# Patient Record
Sex: Female | Born: 1970
Health system: Midwestern US, Community
[De-identification: ages and names within clinical notes are randomized; demographics above are authoritative.]

## PROBLEM LIST (undated history)

## (undated) DIAGNOSIS — Z01812 Encounter for preprocedural laboratory examination: Secondary | ICD-10-CM

## (undated) DIAGNOSIS — N6011 Diffuse cystic mastopathy of right breast: Secondary | ICD-10-CM

## (undated) DIAGNOSIS — R7303 Prediabetes: Secondary | ICD-10-CM

## (undated) DIAGNOSIS — J329 Chronic sinusitis, unspecified: Secondary | ICD-10-CM

## (undated) DIAGNOSIS — S139XXA Sprain of joints and ligaments of unspecified parts of neck, initial encounter: Secondary | ICD-10-CM

## (undated) DIAGNOSIS — R142 Eructation: Secondary | ICD-10-CM

## (undated) DIAGNOSIS — N6012 Diffuse cystic mastopathy of left breast: Secondary | ICD-10-CM

## (undated) DIAGNOSIS — R748 Abnormal levels of other serum enzymes: Secondary | ICD-10-CM

## (undated) DIAGNOSIS — Z1231 Encounter for screening mammogram for malignant neoplasm of breast: Secondary | ICD-10-CM

## (undated) DIAGNOSIS — G43809 Other migraine, not intractable, without status migrainosus: Secondary | ICD-10-CM

## (undated) DIAGNOSIS — K224 Dyskinesia of esophagus: Secondary | ICD-10-CM

## (undated) DIAGNOSIS — F064 Anxiety disorder due to known physiological condition: Secondary | ICD-10-CM

## (undated) DIAGNOSIS — R143 Flatulence: Secondary | ICD-10-CM

## (undated) DIAGNOSIS — R220 Localized swelling, mass and lump, head: Secondary | ICD-10-CM

## (undated) DIAGNOSIS — R1013 Epigastric pain: Secondary | ICD-10-CM

## (undated) DIAGNOSIS — R2232 Localized swelling, mass and lump, left upper limb: Secondary | ICD-10-CM

## (undated) DIAGNOSIS — Z1211 Encounter for screening for malignant neoplasm of colon: Secondary | ICD-10-CM

## (undated) DIAGNOSIS — R1011 Right upper quadrant pain: Secondary | ICD-10-CM

## (undated) DIAGNOSIS — R221 Localized swelling, mass and lump, neck: Secondary | ICD-10-CM

## (undated) DIAGNOSIS — R141 Gas pain: Secondary | ICD-10-CM

## (undated) DIAGNOSIS — R519 Headache, unspecified: Secondary | ICD-10-CM

## (undated) DIAGNOSIS — M7989 Other specified soft tissue disorders: Secondary | ICD-10-CM

## (undated) DIAGNOSIS — M5030 Other cervical disc degeneration, unspecified cervical region: Secondary | ICD-10-CM

## (undated) DIAGNOSIS — G4730 Sleep apnea, unspecified: Secondary | ICD-10-CM

## (undated) DIAGNOSIS — Z5189 Encounter for other specified aftercare: Secondary | ICD-10-CM

## (undated) DIAGNOSIS — Z8489 Family history of other specified conditions: Secondary | ICD-10-CM

## (undated) DIAGNOSIS — F319 Bipolar disorder, unspecified: Secondary | ICD-10-CM

## (undated) DIAGNOSIS — K219 Gastro-esophageal reflux disease without esophagitis: Secondary | ICD-10-CM

## (undated) DIAGNOSIS — K5900 Constipation, unspecified: Secondary | ICD-10-CM

## (undated) DIAGNOSIS — Z87442 Personal history of urinary calculi: Secondary | ICD-10-CM

## (undated) DIAGNOSIS — J189 Pneumonia, unspecified organism: Secondary | ICD-10-CM

## (undated) DIAGNOSIS — Z8614 Personal history of Methicillin resistant Staphylococcus aureus infection: Secondary | ICD-10-CM

## (undated) DIAGNOSIS — J45909 Unspecified asthma, uncomplicated: Secondary | ICD-10-CM

## (undated) DIAGNOSIS — F988 Other specified behavioral and emotional disorders with onset usually occurring in childhood and adolescence: Secondary | ICD-10-CM

## (undated) DIAGNOSIS — F32A Depression, unspecified: Secondary | ICD-10-CM

## (undated) DIAGNOSIS — F419 Anxiety disorder, unspecified: Secondary | ICD-10-CM

## (undated) DIAGNOSIS — E119 Type 2 diabetes mellitus without complications: Secondary | ICD-10-CM

## (undated) DIAGNOSIS — E785 Hyperlipidemia, unspecified: Secondary | ICD-10-CM

## (undated) DIAGNOSIS — G709 Myoneural disorder, unspecified: Secondary | ICD-10-CM

## (undated) DIAGNOSIS — K3184 Gastroparesis: Secondary | ICD-10-CM

## (undated) DIAGNOSIS — G43909 Migraine, unspecified, not intractable, without status migrainosus: Secondary | ICD-10-CM

## (undated) DIAGNOSIS — F329 Major depressive disorder, single episode, unspecified: Secondary | ICD-10-CM

## (undated) DIAGNOSIS — J449 Chronic obstructive pulmonary disease, unspecified: Secondary | ICD-10-CM

## (undated) DIAGNOSIS — M1990 Unspecified osteoarthritis, unspecified site: Secondary | ICD-10-CM

## (undated) DIAGNOSIS — E039 Hypothyroidism, unspecified: Secondary | ICD-10-CM

## (undated) DIAGNOSIS — R1310 Dysphagia, unspecified: Secondary | ICD-10-CM

## (undated) DIAGNOSIS — J42 Unspecified chronic bronchitis: Secondary | ICD-10-CM

## (undated) DIAGNOSIS — T7840XA Allergy, unspecified, initial encounter: Secondary | ICD-10-CM

## (undated) HISTORY — DX: Personal history of Methicillin resistant Staphylococcus aureus infection: Z86.14

## (undated) HISTORY — DX: Constipation, unspecified: K59.00

## (undated) HISTORY — PX: TONSILLECTOMY AND ADENOIDECTOMY: SHX28

## (undated) HISTORY — DX: Allergy, unspecified, initial encounter: T78.40XA

## (undated) HISTORY — DX: Chronic obstructive pulmonary disease, unspecified: J44.9

## (undated) HISTORY — PX: REPLACEMENT TOTAL KNEE: SUR1224

## (undated) HISTORY — DX: Gastroparesis: K31.84

## (undated) HISTORY — DX: Bipolar disorder, unspecified: F31.9

## (undated) HISTORY — DX: Unspecified osteoarthritis, unspecified site: M19.90

## (undated) HISTORY — DX: Depression, unspecified: F32.A

## (undated) HISTORY — DX: Myoneural disorder, unspecified: G70.9

## (undated) HISTORY — DX: Anxiety disorder, unspecified: F41.9

## (undated) HISTORY — PX: COLONOSCOPY: SHX174

## (undated) HISTORY — DX: Hypothyroidism, unspecified: E03.9

## (undated) HISTORY — DX: Encounter for other specified aftercare: Z51.89

## (undated) HISTORY — DX: Major depressive disorder, single episode, unspecified: F32.9

## (undated) HISTORY — DX: Unspecified chronic bronchitis: J42

## (undated) HISTORY — DX: Other cervical disc degeneration, unspecified cervical region: M50.30

## (undated) HISTORY — PX: ABDOMINAL SURGERY: SHX537

## (undated) HISTORY — PX: UPPER GASTROINTESTINAL ENDOSCOPY: SHX188

## (undated) HISTORY — PX: TONSILLECTOMY: SUR1361

## (undated) HISTORY — PX: SPINE SURGERY: SHX786

## (undated) HISTORY — PX: THYROIDECTOMY: SHX17

## (undated) HISTORY — DX: Dysphagia, unspecified: R13.10

## (undated) HISTORY — DX: Type 2 diabetes mellitus without complications: E11.9

## (undated) HISTORY — PX: JOINT REPLACEMENT: SHX530

## (undated) HISTORY — DX: Hyperlipidemia, unspecified: E78.5

## (undated) HISTORY — DX: Migraine, unspecified, not intractable, without status migrainosus: G43.909

## (undated) HISTORY — DX: Gastro-esophageal reflux disease without esophagitis: K21.9

## (undated) HISTORY — DX: Unspecified asthma, uncomplicated: J45.909

## (undated) HISTORY — PX: CHOLECYSTECTOMY: SHX55

## (undated) HISTORY — DX: Sleep apnea, unspecified: G47.30

## (undated) HISTORY — DX: Other specified behavioral and emotional disorders with onset usually occurring in childhood and adolescence: F98.8

## (undated) MED ORDER — METHYLPREDNISOLONE 4 MG TABS IN A DOSE PACK
4 mg | PACK | ORAL | Status: DC
Start: ? — End: 2014-06-29

## (undated) MED ORDER — MOXIFLOXACIN 400 MG TAB
400 mg | ORAL_TABLET | Freq: Every day | ORAL | Status: DC
Start: ? — End: 2014-09-07

## (undated) MED ORDER — BENZONATATE 100 MG CAP
100 mg | ORAL_CAPSULE | Freq: Three times a day (TID) | ORAL | Status: AC | PRN
Start: ? — End: 2013-06-02

## (undated) MED FILL — AMPHETAMINE-DEXTROAMPHETAMIN 5 TABS: 5 MG | 30 days supply | Qty: 30 | Fill #0 | Status: AC

## (undated) MED FILL — ALBUTEROL SULFATE HFA 108 AERS: 108 (90 Base) MCG/ACT | 16 days supply | Qty: 9 | Fill #0 | Status: AC

## (undated) MED FILL — UNITHROID 88MCG TABS: 88 MCG | 15 days supply | Qty: 15 | Fill #0 | Status: AC

## (undated) MED FILL — ESOMEPRAZOLE MAGNESIUM 40MG CPDR: 40 MG | 30 days supply | Qty: 30 | Fill #0 | Status: AC

## (undated) MED FILL — VYVANSE 60MG CAPS: 60 MG | 30 days supply | Qty: 30 | Fill #0 | Status: AC

## (undated) MED FILL — UNITHROID 88MCG TABS: 88 MCG | 30 days supply | Qty: 30 | Fill #0 | Status: AC

## (undated) MED FILL — UNITHROID 88MCG TABS: 88 MCG | 90 days supply | Qty: 90 | Fill #0 | Status: AC

## (undated) MED FILL — ESOMEPRAZOLE MAGNESIUM 40MG CPDR: 40 MG | 60 days supply | Qty: 60 | Fill #1 | Status: AC

## (undated) MED FILL — EPINEPHRINE 0.3MG/0.3ML SOAJ: 0.3 MG/0.3ML | 1 days supply | Qty: 2 | Fill #0 | Status: AC

## (undated) MED FILL — HYDROCHLOROTHIAZIDE 12.5MG TABS: 12.5 MG | 90 days supply | Qty: 90 | Fill #0 | Status: AC

## (undated) MED FILL — VITAMIN D (ERGOCALCIFE 1.25 MG CAPS: 1.25 MG (50000 UT) | 84 days supply | Qty: 12 | Fill #0 | Status: AC

## (undated) MED FILL — HYDROCHLOROTHIAZIDE 12.5MG TABS: 12.5 MG | 30 days supply | Qty: 30 | Fill #0 | Status: AC

## (undated) MED FILL — SUPREP BOWEL PREP SOL (354ML=1BOX): 17.5-3.13-1.6 GM/177ML | 1 days supply | Qty: 354 | Fill #0 | Status: AC

## (undated) MED FILL — BUDESONIDE-FORMOTEROL  160-4.5 AERO: 160-4.5 MCG/ACT | 30 days supply | Qty: 10 | Fill #0 | Status: AC

---

## 1997-05-18 HISTORY — PX: CARPAL TUNNEL RELEASE: SHX101

## 1998-04-08 ENCOUNTER — Encounter: Admission: RE | Admit: 1998-04-08 | Discharge: 1998-07-07 | Payer: Self-pay | Admitting: Family Medicine

## 1998-09-11 ENCOUNTER — Other Ambulatory Visit: Admission: RE | Admit: 1998-09-11 | Discharge: 1998-09-11 | Payer: Self-pay

## 1998-10-30 ENCOUNTER — Encounter: Admission: RE | Admit: 1998-10-30 | Discharge: 1999-01-28 | Payer: Self-pay | Admitting: Podiatry

## 1998-12-27 ENCOUNTER — Encounter: Admission: RE | Admit: 1998-12-27 | Discharge: 1999-01-14 | Payer: Self-pay | Admitting: *Deleted

## 1999-02-04 ENCOUNTER — Encounter: Admission: RE | Admit: 1999-02-04 | Discharge: 1999-02-04 | Payer: Self-pay

## 1999-02-17 ENCOUNTER — Encounter: Admission: RE | Admit: 1999-02-17 | Discharge: 1999-05-18 | Payer: Self-pay | Admitting: *Deleted

## 1999-09-23 ENCOUNTER — Other Ambulatory Visit: Admission: RE | Admit: 1999-09-23 | Discharge: 1999-09-23 | Payer: Self-pay | Admitting: Family Medicine

## 2000-11-03 ENCOUNTER — Encounter: Admission: RE | Admit: 2000-11-03 | Discharge: 2001-02-01 | Payer: Self-pay | Admitting: Orthopedic Surgery

## 2000-11-22 ENCOUNTER — Encounter: Payer: Self-pay | Admitting: Family Medicine

## 2000-11-22 ENCOUNTER — Ambulatory Visit (HOSPITAL_COMMUNITY): Admission: RE | Admit: 2000-11-22 | Discharge: 2000-11-22 | Payer: Self-pay | Admitting: Family Medicine

## 2001-08-10 ENCOUNTER — Encounter: Payer: Self-pay | Admitting: General Surgery

## 2001-08-12 ENCOUNTER — Encounter: Payer: Self-pay | Admitting: General Surgery

## 2001-08-12 ENCOUNTER — Observation Stay (HOSPITAL_COMMUNITY): Admission: RE | Admit: 2001-08-12 | Discharge: 2001-08-13 | Payer: Self-pay | Admitting: General Surgery

## 2002-02-16 ENCOUNTER — Ambulatory Visit (HOSPITAL_COMMUNITY): Admission: RE | Admit: 2002-02-16 | Discharge: 2002-02-16 | Payer: Self-pay | Admitting: Gastroenterology

## 2002-03-20 ENCOUNTER — Encounter: Admission: RE | Admit: 2002-03-20 | Discharge: 2002-03-20 | Payer: Self-pay | Admitting: Endocrinology

## 2002-03-20 ENCOUNTER — Encounter: Payer: Self-pay | Admitting: Endocrinology

## 2003-01-08 ENCOUNTER — Encounter: Payer: Self-pay | Admitting: Endocrinology

## 2003-01-08 ENCOUNTER — Encounter: Admission: RE | Admit: 2003-01-08 | Discharge: 2003-01-08 | Payer: Self-pay | Admitting: Endocrinology

## 2004-02-11 ENCOUNTER — Encounter: Admission: RE | Admit: 2004-02-11 | Discharge: 2004-02-11 | Payer: Self-pay | Admitting: Endocrinology

## 2005-08-24 ENCOUNTER — Emergency Department (HOSPITAL_COMMUNITY): Admission: EM | Admit: 2005-08-24 | Discharge: 2005-08-25 | Payer: Self-pay | Admitting: Emergency Medicine

## 2005-08-25 ENCOUNTER — Ambulatory Visit: Payer: Self-pay | Admitting: Family Medicine

## 2005-08-25 ENCOUNTER — Ambulatory Visit (HOSPITAL_COMMUNITY): Admission: RE | Admit: 2005-08-25 | Discharge: 2005-08-25 | Payer: Self-pay | Admitting: Family Medicine

## 2006-03-10 ENCOUNTER — Ambulatory Visit: Payer: Self-pay | Admitting: Gynecology

## 2006-03-17 ENCOUNTER — Encounter: Admission: RE | Admit: 2006-03-17 | Discharge: 2006-03-17 | Payer: Self-pay | Admitting: Gynecology

## 2006-05-21 ENCOUNTER — Encounter: Admission: RE | Admit: 2006-05-21 | Discharge: 2006-05-21 | Payer: Self-pay | Admitting: *Deleted

## 2006-06-14 ENCOUNTER — Ambulatory Visit: Payer: Self-pay | Admitting: Gynecology

## 2006-06-14 ENCOUNTER — Encounter (INDEPENDENT_AMBULATORY_CARE_PROVIDER_SITE_OTHER): Payer: Self-pay | Admitting: Gynecology

## 2006-07-14 ENCOUNTER — Ambulatory Visit: Payer: Self-pay | Admitting: Gastroenterology

## 2006-07-15 ENCOUNTER — Ambulatory Visit: Payer: Self-pay | Admitting: Gastroenterology

## 2006-07-23 ENCOUNTER — Ambulatory Visit (HOSPITAL_COMMUNITY): Admission: RE | Admit: 2006-07-23 | Discharge: 2006-07-23 | Payer: Self-pay | Admitting: Gastroenterology

## 2006-08-02 ENCOUNTER — Ambulatory Visit (HOSPITAL_COMMUNITY): Admission: RE | Admit: 2006-08-02 | Discharge: 2006-08-02 | Payer: Self-pay | Admitting: Gastroenterology

## 2006-08-16 ENCOUNTER — Ambulatory Visit: Payer: Self-pay | Admitting: Gastroenterology

## 2006-08-18 ENCOUNTER — Ambulatory Visit (HOSPITAL_COMMUNITY): Admission: RE | Admit: 2006-08-18 | Discharge: 2006-08-18 | Payer: Self-pay | Admitting: Gastroenterology

## 2006-08-19 ENCOUNTER — Ambulatory Visit: Payer: Self-pay | Admitting: Gastroenterology

## 2006-11-16 ENCOUNTER — Ambulatory Visit: Payer: Self-pay | Admitting: Gastroenterology

## 2006-11-29 ENCOUNTER — Encounter: Payer: Self-pay | Admitting: Gastroenterology

## 2006-11-29 ENCOUNTER — Ambulatory Visit (HOSPITAL_COMMUNITY): Admission: RE | Admit: 2006-11-29 | Discharge: 2006-11-29 | Payer: Self-pay | Admitting: Gastroenterology

## 2006-12-03 ENCOUNTER — Ambulatory Visit: Payer: Self-pay | Admitting: Gastroenterology

## 2007-01-21 ENCOUNTER — Encounter: Admission: RE | Admit: 2007-01-21 | Discharge: 2007-01-21 | Payer: Self-pay | Admitting: General Surgery

## 2007-04-08 ENCOUNTER — Encounter (INDEPENDENT_AMBULATORY_CARE_PROVIDER_SITE_OTHER): Payer: Self-pay | Admitting: General Surgery

## 2007-04-08 ENCOUNTER — Ambulatory Visit (HOSPITAL_COMMUNITY): Admission: RE | Admit: 2007-04-08 | Discharge: 2007-04-09 | Payer: Self-pay | Admitting: General Surgery

## 2007-06-16 ENCOUNTER — Ambulatory Visit: Payer: Self-pay | Admitting: Obstetrics & Gynecology

## 2007-06-17 LAB — CELIAC ANTIBODY PROFILE
IMMUNOGLOBULIN A,IGA: 104 mg/dL (ref 68–378)
IMMUNOGLOBULIN A: 104 mg/dL (ref 68–378)

## 2007-06-18 LAB — TISSUE TRANSGLUT. AB, IGA: Tis transglut IgA: 1 Units (ref 0–19)

## 2007-06-29 ENCOUNTER — Encounter: Payer: Self-pay | Admitting: Family Medicine

## 2007-06-29 ENCOUNTER — Ambulatory Visit: Payer: Self-pay | Admitting: Family Medicine

## 2007-09-13 DIAGNOSIS — Z87442 Personal history of urinary calculi: Secondary | ICD-10-CM | POA: Insufficient documentation

## 2007-09-13 DIAGNOSIS — E1159 Type 2 diabetes mellitus with other circulatory complications: Secondary | ICD-10-CM | POA: Insufficient documentation

## 2007-09-13 DIAGNOSIS — F341 Dysthymic disorder: Secondary | ICD-10-CM

## 2007-09-13 DIAGNOSIS — E785 Hyperlipidemia, unspecified: Secondary | ICD-10-CM | POA: Insufficient documentation

## 2007-09-13 DIAGNOSIS — F321 Major depressive disorder, single episode, moderate: Secondary | ICD-10-CM | POA: Insufficient documentation

## 2007-09-13 DIAGNOSIS — J45909 Unspecified asthma, uncomplicated: Secondary | ICD-10-CM | POA: Insufficient documentation

## 2007-09-13 DIAGNOSIS — E039 Hypothyroidism, unspecified: Secondary | ICD-10-CM | POA: Insufficient documentation

## 2007-09-13 DIAGNOSIS — I10 Essential (primary) hypertension: Secondary | ICD-10-CM | POA: Insufficient documentation

## 2007-09-13 DIAGNOSIS — Z9889 Other specified postprocedural states: Secondary | ICD-10-CM | POA: Insufficient documentation

## 2007-09-13 DIAGNOSIS — G43909 Migraine, unspecified, not intractable, without status migrainosus: Secondary | ICD-10-CM | POA: Insufficient documentation

## 2007-09-13 DIAGNOSIS — E1169 Type 2 diabetes mellitus with other specified complication: Secondary | ICD-10-CM | POA: Insufficient documentation

## 2007-09-13 DIAGNOSIS — Z8719 Personal history of other diseases of the digestive system: Secondary | ICD-10-CM | POA: Insufficient documentation

## 2007-09-13 DIAGNOSIS — M129 Arthropathy, unspecified: Secondary | ICD-10-CM | POA: Insufficient documentation

## 2007-09-26 LAB — METABOLIC PANEL, COMPREHENSIVE
A-G Ratio: 1.3 (ref 1.1–2.2)
ALT (SGPT): 29 U/L — ABNORMAL LOW (ref 30–65)
AST (SGOT): 11 U/L — ABNORMAL LOW (ref 15–37)
Albumin: 4 g/dL (ref 3.5–5.0)
Alk. phosphatase: 82 U/L (ref 50–136)
Anion gap: 9 mmol/L (ref 5–15)
BUN/Creatinine ratio: 15 (ref 12–20)
BUN: 12 MG/DL (ref 6–20)
Bilirubin, total: 0.8 MG/DL (ref <1.0)
CO2: 23 MMOL/L (ref 21–32)
Calcium: 8.4 MG/DL — ABNORMAL LOW (ref 8.5–10.1)
Chloride: 105 MMOL/L (ref 97–108)
Creatinine: 0.8 MG/DL (ref 0.6–1.3)
GFR est AA: >60 mL/min/{1.73_m2} (ref >60)
GFR est non-AA: >60 mL/min/{1.73_m2} (ref >60)
Globulin: 3.1 g/dL (ref 2.0–4.0)
Glucose: 108 MG/DL — ABNORMAL HIGH (ref 50–100)
Potassium: 3.7 MMOL/L (ref 3.5–5.1)
Protein, total: 7.1 g/dL (ref 6.4–8.2)
Sodium: 137 MMOL/L (ref 136–145)

## 2007-09-26 LAB — URINALYSIS W/ REFLEX CULTURE
Bacteria: NEGATIVE /HPF
Bilirubin: NEGATIVE
Blood: NEGATIVE
Glucose: NEGATIVE MG/DL
Leukocyte Esterase: NEGATIVE
Nitrites: NEGATIVE
Protein: NEGATIVE MG/DL
Specific gravity: 1.008 (ref 1.003–1.030)
Urobilinogen: 0.2 EU/DL (ref 0.2–1.0)
pH (UA): 7.5 (ref 5.0–8.0)

## 2007-09-26 LAB — CBC WITH AUTOMATED DIFF
ABS. BASOPHILS: 0 10*3/uL (ref 0.0–0.1)
ABS. EOSINOPHILS: 0 10*3/uL (ref 0.0–0.4)
ABS. LYMPHOCYTES: 0.5 10*3/uL — ABNORMAL LOW (ref 0.8–3.5)
ABS. MONOCYTES: 0.3 10*3/uL (ref 0–1.0)
ABS. NEUTROPHILS: 9.4 10*3/uL — ABNORMAL HIGH (ref 1.8–8.0)
BASOPHILS: 0 % (ref 0–1)
EOSINOPHILS: 0 % (ref 0–7)
HCT: 39.8 % (ref 35.0–47.0)
HGB: 13.6 g/dL (ref 11.5–16.0)
LYMPHOCYTES: 5 % — ABNORMAL LOW (ref 12–49)
MCH: 28.3 PG (ref 26.0–34.0)
MCHC: 34.2 g/dL (ref 30.0–35.0)
MCV: 82.9 FL (ref 80.0–99.0)
MONOCYTES: 3 % — ABNORMAL LOW (ref 5–13)
NEUTROPHILS: 92 % — ABNORMAL HIGH (ref 32–75)
PLATELET: 150 10*3/uL (ref 150–400)
RBC: 4.8 M/uL (ref 3.80–5.20)
RDW: 12.5 % (ref 11.5–14.5)
WBC: 10.2 10*3/uL (ref 3.6–11.0)

## 2007-09-26 LAB — LIPASE: Lipase: 155 U/L (ref 114–286)

## 2007-09-26 LAB — AMYLASE: Amylase: 55 U/L (ref 25–115)

## 2007-11-23 ENCOUNTER — Encounter: Payer: Self-pay | Admitting: Gastroenterology

## 2008-12-14 ENCOUNTER — Ambulatory Visit (HOSPITAL_COMMUNITY): Payer: Self-pay | Admitting: Psychiatry

## 2009-01-07 MED ADMIN — midazolam (VERSED) injection 0.5-10 mg: INTRAVENOUS | @ 17:00:00 | NDC 10019002837

## 2009-01-07 MED ADMIN — benzocaine (HURRICANE) 20 % spray: @ 17:00:00 | NDC 00283067902

## 2009-01-07 MED ADMIN — dextrose 5 % - 0.9% NaCl infusion: INTRAVENOUS | @ 17:00:00 | NDC 00409794109

## 2009-01-07 MED FILL — MIDAZOLAM 1 MG/ML IJ SOLN: 1 mg/mL | INTRAMUSCULAR | Qty: 10

## 2009-01-07 MED FILL — MIDAZOLAM 1 MG/ML IJ SOLN: 1 mg/mL | INTRAMUSCULAR | Qty: 5

## 2009-01-07 MED FILL — DEXTROSE 5% IN NORMAL SALINE IV: INTRAVENOUS | Qty: 1000

## 2009-01-07 MED FILL — HURRICAINE 20 % MUCOSAL SPRAY: 20 % | Qty: 59.2

## 2009-01-07 NOTE — H&P (Signed)
"  IBS"    Diarrhea    Constipation    Bloating    Hx Vit D Deficiency; ? Celiac disease    I discussed with the patient the objectives, risks, consequences and alternatives to the procedure.      The patient was seen and examined in the endoscopy suite.  The airway was assessed and docuemeted.  The problem list and mediacations were reviewed.   The heart lungs and mental status were satisfactory for the administration of conscious sedation and for the procedure.      Gwenette Greet, MD  12:11 PM

## 2009-01-07 NOTE — Procedures (Signed)
Colonoscopy    Indications: constiption    Pre-operative Diagnosis: BILATERAL LOWER QUAD PAIN; DIARRHEA and constipation    Post-operative Diagnosis:  posterior anal fissure    Midazolam 13 mg    Procedure Details   Prior to the procedure its objectives, risks, consequences and alternatives were discussed with the patient who then elected to proceed.  All questions were answered.      Digital Rectal Exam:  was normal     The Olympus videocolonoscope was inserted in the rectum and advanced to the cecum.The cecum was identified by typical landmarks.  The terminal ileum was intubated and appeared normal.  Biopsies were taken.  The colonoscope was slowly and carefully withdrawn as the mucosa was inspected.  No abnormalities were noted.  Retroflexion in the rectum was normal.  Random biopsies were performed.      The only abnormality seen was a posterior anal fissure, seen upon removal of the scope.      The preparation was good.    Findings:    Anal fissure; otherwise normal ileum and colon.                Complications:  none             Willey Due F. Khaliyah Northrop, MD  12:59 PM  01/07/2009

## 2009-01-07 NOTE — Progress Notes (Signed)
I reviewed the material on the intenet regarding Meridia after reviewing the drug warning in [facility].      I spoke with an anesthesiologist by phone.    I spoke with the patient.      We will proceed with versed alone.  Alternatives discussed including re-scheduling    Gwenette Greet, MD  12:28 PM  01/07/2009

## 2009-01-07 NOTE — Procedures (Signed)
Indications:  Diarrhea; dyspepsia, bloating    Medications:  Residual sedation;    midazolam 2 mg IV    Description of Procedure:    Prior to the procedure its objectives, risks, consequences and alternatives were discussed with the patient who then elected to proceed.  The Olympus video endoscope was inserted under direct vision into the mouth and then into the esophagus.  The esophagus looked normal to the z-line was located at 39cm.  It was irregular.  There was a small sliding hiatal hernia measuring 2 cm.  There were no diagnostic abnormalities of the body, fundus, antrum, cardia and incisura of the stomach.  This included direct and retroflexion examination.  The first and second portion of the duodenum appeared normal.      I took biopsies of the duodenum, stomach, esophago-gastric junction and the mid-esophagus.        Complications: There were no apparent complications and the patient tolerated the procedure well.        Impressions:  Irregular z line and hiatal hernia; otherwise normal examination.    Signed By: Tashyra Adduci F. Paelyn Smick, MD                        January 07, 2009     1:13 PM

## 2009-01-07 NOTE — Procedures (Signed)
Colonoscopy    Indications: constiption    Pre-operative Diagnosis: BILATERAL LOWER QUAD PAIN; DIARRHEA and constipation    Post-operative Diagnosis:  posterior anal fissure    Midazolam 13 mg    Procedure Details   Prior to the procedure its objectives, risks, consequences and alternatives were discussed with the patient who then elected to proceed.  All questions were answered.      Digital Rectal Exam:  was normal     The Olympus videocolonoscope was inserted in the rectum and advanced to the cecum.The cecum was identified by typical landmarks.  The terminal ileum was intubated and appeared normal.  Biopsies were taken.  The colonoscope was slowly and carefully withdrawn as the mucosa was inspected.  No abnormalities were noted.  Retroflexion in the rectum was normal.  Random biopsies were performed.      The only abnormality seen was a posterior anal fissure, seen upon removal of the scope.      The preparation was good.    Findings:    Anal fissure; otherwise normal ileum and colon.                Complications:  none             Gwenette Greet, MD  12:59 PM  01/07/2009

## 2009-01-07 NOTE — Procedures (Signed)
Indications:  Diarrhea; dyspepsia, bloating    Medications:  Residual sedation;    midazolam 2 mg IV    Description of Procedure:    Prior to the procedure its objectives, risks, consequences and alternatives were discussed with the patient who then elected to proceed.  The Olympus video endoscope was inserted under direct vision into the mouth and then into the esophagus.  The esophagus looked normal to the z-line was located at 39cm.  It was irregular.  There was a small sliding hiatal hernia measuring 2 cm.  There were no diagnostic abnormalities of the body, fundus, antrum, cardia and incisura of the stomach.  This included direct and retroflexion examination.  The first and second portion of the duodenum appeared normal.      I took biopsies of the duodenum, stomach, esophago-gastric junction and the mid-esophagus.        Complications: There were no apparent complications and the patient tolerated the procedure well.        Impressions:  Irregular z line and hiatal hernia; otherwise normal examination.    Signed By: Gwenette Greet, MD                        January 07, 2009     1:13 PM

## 2009-01-14 ENCOUNTER — Ambulatory Visit (HOSPITAL_COMMUNITY): Payer: Self-pay | Admitting: Psychiatry

## 2009-01-15 ENCOUNTER — Telehealth: Payer: Self-pay | Admitting: Gastroenterology

## 2009-02-19 ENCOUNTER — Ambulatory Visit (HOSPITAL_COMMUNITY): Payer: Self-pay | Admitting: Psychiatry

## 2009-04-30 ENCOUNTER — Ambulatory Visit (HOSPITAL_COMMUNITY): Payer: Self-pay | Admitting: Psychiatry

## 2009-06-06 MED ADMIN — sodium chloride (NS) flush 10 mL: INTRAVENOUS | @ 16:00:00 | NDC 82903065462

## 2009-06-06 MED ADMIN — sincalide (KINEVAC) injection 1.59 mcg: INTRAVENOUS | @ 17:00:00 | NDC 00270055615

## 2009-06-06 MED ADMIN — 0.9% sodium chloride infusion: INTRAVENOUS | @ 17:00:00 | NDC 00409798420

## 2009-06-06 MED FILL — SODIUM CHLORIDE 0.9 % IV: INTRAVENOUS | Qty: 25

## 2009-06-06 MED FILL — KINEVAC 5 MCG SOLUTION FOR INJECTION: 5 mcg | INTRAMUSCULAR | Qty: 5

## 2009-06-06 MED FILL — BD POSIFLUSH NORMAL SALINE 0.9 % INJECTION SYRINGE: INTRAMUSCULAR | Qty: 20

## 2009-06-18 LAB — CBC WITH AUTOMATED DIFF
ABS. BASOPHILS: 0 10*3/uL (ref 0.0–0.1)
ABS. EOSINOPHILS: 0 10*3/uL (ref 0.0–0.4)
ABS. LYMPHOCYTES: 1.1 10*3/uL (ref 0.8–3.5)
ABS. MONOCYTES: 0.5 10*3/uL (ref 0.0–1.0)
ABS. NEUTROPHILS: 5.4 10*3/uL (ref 1.8–8.0)
BASOPHILS: 0 % (ref 0–1)
EOSINOPHILS: 0 % (ref 0–7)
HCT: 43.6 % (ref 35.0–47.0)
HGB: 14.9 g/dL (ref 11.5–16.0)
LYMPHOCYTES: 16 % (ref 12–49)
MCH: 28 PG (ref 26.0–34.0)
MCHC: 34.2 g/dL (ref 30.0–36.5)
MCV: 82 FL (ref 80.0–99.0)
MONOCYTES: 7 % (ref 5–13)
NEUTROPHILS: 77 % — ABNORMAL HIGH (ref 32–75)
PLATELET: 166 10*3/uL (ref 150–400)
RBC: 5.32 M/uL — ABNORMAL HIGH (ref 3.80–5.20)
RDW: 12.5 % (ref 11.5–14.5)
WBC: 7 10*3/uL (ref 3.6–11.0)

## 2009-06-18 LAB — METABOLIC PANEL, COMPREHENSIVE
A-G Ratio: 1.2 (ref 1.1–2.2)
ALT (SGPT): 28 U/L (ref 12–78)
AST (SGOT): 46 U/L — ABNORMAL HIGH (ref 15–37)
Albumin: 4.7 g/dL (ref 3.5–5.0)
Alk. phosphatase: 59 U/L (ref 50–136)
Anion gap: 10 mmol/L (ref 5–15)
BUN/Creatinine ratio: 17 (ref 12–20)
BUN: 12 MG/DL (ref 6–20)
Bilirubin, total: 1 MG/DL (ref 0.2–1.0)
CO2: 25 MMOL/L (ref 21–32)
Calcium: 9.4 MG/DL (ref 8.5–10.1)
Chloride: 100 MMOL/L (ref 97–108)
Creatinine: 0.7 MG/DL (ref 0.6–1.3)
GFR est AA: >60 mL/min/{1.73_m2} (ref >60)
GFR est non-AA: >60 mL/min/{1.73_m2} (ref >60)
Globulin: 3.9 g/dL (ref 2.0–4.0)
Glucose: 81 MG/DL (ref 65–100)
Potassium: 5 MMOL/L (ref 3.5–5.1)
Protein, total: 8.6 g/dL — ABNORMAL HIGH (ref 6.4–8.2)
Sodium: 135 MMOL/L — ABNORMAL LOW (ref 136–145)

## 2009-06-18 LAB — URINALYSIS W/ REFLEX CULTURE
Bacteria: NEGATIVE /HPF
Bilirubin: NEGATIVE
Blood: NEGATIVE
Glucose: NEGATIVE MG/DL
Ketone: 15 MG/DL — AB
Leukocyte Esterase: NEGATIVE
Nitrites: NEGATIVE
Protein: NEGATIVE MG/DL
Specific gravity: 1.01 (ref 1.003–1.030)
Urobilinogen: 0.2 EU/DL (ref 0.2–1.0)
pH (UA): 6 (ref 5.0–8.0)

## 2009-06-18 LAB — AMYLASE: Amylase: 59 U/L (ref 25–115)

## 2009-06-18 LAB — LIPASE: Lipase: 78 U/L (ref 73–393)

## 2009-06-18 LAB — HCG URINE, QL. - POC: Pregnancy test,urine (POC): NEGATIVE

## 2009-06-18 MED ORDER — OMEPRAZOLE 40 MG CAP, DELAYED RELEASE
40 mg | ORAL_CAPSULE | Freq: Every day | ORAL | Status: AC
Start: 2009-06-18 — End: 2010-06-13

## 2009-06-18 MED ORDER — DICYCLOMINE 20 MG TAB
20 mg | ORAL_TABLET | Freq: Four times a day (QID) | ORAL | Status: AC
Start: 2009-06-18 — End: 2009-06-23

## 2009-06-18 MED ADMIN — ketorolac (TORADOL) injection 30 mg: INTRAVENOUS | @ 22:00:00 | NDC 00409379501

## 2009-06-18 MED ADMIN — sodium chloride 0.9 % bolus infusion 1,000 mL: INTRAVENOUS | @ 22:00:00 | NDC 00409798309

## 2009-06-18 MED ADMIN — ondansetron (ZOFRAN) injection 4 mg: INTRAVENOUS | @ 22:00:00 | NDC 00143989105

## 2009-06-18 MED FILL — ONDANSETRON (PF) 4 MG/2 ML INJECTION: 4 mg/2 mL | INTRAMUSCULAR | Qty: 2

## 2009-06-18 MED FILL — KETOROLAC TROMETHAMINE 30 MG/ML INJECTION: 30 mg/mL (1 mL) | INTRAMUSCULAR | Qty: 1

## 2009-06-18 MED FILL — SODIUM CHLORIDE 0.9 % IV: INTRAVENOUS | Qty: 1000

## 2009-06-18 NOTE — ED Provider Notes (Signed)
I was personally available for consultation in the emergency department.  I have reviewed the chart and agree with the documentation recorded by the MLP, including the assessment, treatment plan, and disposition.  Alantra Popoca M. Berdella Bacot, MD

## 2009-06-18 NOTE — ED Notes (Signed)
Orthostatic vital signs:  Supine 120/62 HR 71; Sitting 119/73 HR 68; Standing 120/79 HR 77.

## 2009-06-18 NOTE — ED Notes (Signed)
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 Had recent colonoscopy and has a fissure. Has had stomach problems for the past 2 years, has been told she has IBS

## 2009-06-18 NOTE — ED Notes (Signed)
Pt resting on stretcher with family at bedside getting IV fluids.

## 2009-06-18 NOTE — ED Provider Notes (Signed)
Patient is a 39 y.o. female presenting with abdominal pain and dizziness. The history is provided by the patient.   Abdominal Pain   This is a chronic problem. The current episode started more than 1 week ago ( x 2 years). Episode frequency: Intermittently. The pain is associated with an unknown factor. The pain is located in the epigastric region and periumbilical region. The quality of the pain is sharp. The pain is at a severity of 4/10. The pain is mild. Pertinent negatives include no fever, no melena, no vomiting, no constipation, no dysuria, no frequency, no hematuria, no headaches, no myalgias, no chest pain and no back pain. Nothing worsens the pain. The pain is relieved by nothing. Past workup includes ultrasound, UGI and colonoscopy (August).   Dizziness   This is a new problem. The current episode started 3 to 5 hours ago. The problem has been gradually improving. There was no loss of consciousness. The problem is associated with nothing. Associated symptoms include abdominal pain and dizziness. Pertinent negatives include no chest pain, no palpitations, no confusion, no fever, no vomiting, no headaches, no back pain, no weakness and no melena. Her past medical history does not include no syncope.   Was at work today when got sharp pain to lower abdomen which is the "same" pain she typically has and pain caused her to feels as if she was "going to pass out".  Has not eaten breakfast or lunch when incident occurred.  Symptoms have since resolved with "aching" to lower abdomen.  Has been followed by GI Dr Azalia Bilis for abdomen and has had negative Hida scan, [place], CT and colonoscopy. Had endoscopy showing hiaitle hernia and reflux.    Denies fever, CP, SOB, urinary symptoms, flank pain.      Past Medical History   Diagnosis Date   ??? Thyroid disease    ??? Nausea & vomiting    ??? Other ill-defined conditions      ibs   ??? Other ill-defined conditions      torn acl   ??? Other ill-defined conditions       detached right retina - self healed   ??? IBS (irritable bowel syndrome)    ??? Anal fissure    ??? Torn ACL    ??? Detached retina           Past Surgical History   Procedure Date   ??? Hx other surgical      wisdom teeth,gums   ??? Colonoscopy,biopsy 01/07/2009         ??? Upper gi endoscopy,biopsy 01/07/2009         ??? Hx orthopaedic            Family History   Problem Relation Age of Onset   ??? Other Mother    ??? Anxiety Mother           History   Social History   ??? Marital Status: Single     Spouse Name: N/A     Number of Children: N/A   ??? Years of Education: N/A   Occupational History   ??? Not on file.   Social History Main Topics   ??? Smoking status: Never Smoker    ??? Smokeless tobacco: Never Used   ??? Alcohol Use: Yes      socially   ??? Drug Use: No   ??? Sexually Active:    Other Topics Concern   ??? Not on file   Social History Narrative   ???  No narrative on file           ALLERGIES: Wellbutrin and Percocet      Review of Systems   Constitutional: Negative.  Negative for fever.   HENT: Negative for neck pain and ear discharge.    Eyes: Negative for photophobia, pain, discharge and visual disturbance.   Respiratory: Negative for apnea, cough, chest tightness and shortness of breath.    Cardiovascular: Negative for chest pain, palpitations and leg swelling.   Gastrointestinal: Positive for abdominal pain. Negative for vomiting, constipation, blood in stool, melena and abdominal distention.   Genitourinary: Negative for dysuria, frequency, hematuria, flank pain and difficulty urinating.   Musculoskeletal: Negative for myalgias, back pain, joint swelling and gait problem.   Skin: Negative for color change and pallor.   Neurological: Positive for dizziness. Negative for syncope, weakness, numbness and headaches.   Hematological: Negative.    Psychiatric/Behavioral: Negative for behavioral problems and confusion. The patient is not nervous/anxious.        Filed Vitals:    06/18/2009  2:01 PM   BP: 116/81   Pulse: 82    Temp: 98.6 ??F (37 ??C)   Resp: 18   Height: 5\' 6"  (1.676 m)   Weight: 171 lb (77.565 kg)   SpO2: 100%              Physical Exam   Nursing note and vitals reviewed.  Constitutional: She is oriented to person, place, and time. She appears well-developed and well-nourished. No distress.   HENT:   Head: Normocephalic and atraumatic.   Right Ear: External ear normal.   Left Ear: External ear normal.   Nose: Nose normal.   Mouth/Throat: Oropharynx is clear and moist.   Eyes: Conjunctivae and extraocular motions are normal. Pupils are equal, round, and reactive to light. Right eye exhibits no discharge. Left eye exhibits no discharge.   Neck: Normal range of motion. Neck supple.   Cardiovascular: Normal rate, regular rhythm, normal heart sounds and intact distal pulses.    Pulmonary/Chest: Effort normal and breath sounds normal.   Abdominal: Soft. Bowel sounds are normal. She exhibits no distension. Tenderness (Mild periumbilical ) is present. She has no rebound and no guarding.   Musculoskeletal: Normal range of motion. She exhibits no edema and no tenderness.   Lymphadenopathy:     She has no cervical adenopathy.   Neurological: She is alert and oriented to person, place, and time. She has normal reflexes. No cranial nerve deficit. Coordination normal.   Skin: Skin is warm and dry. No rash noted.   Psychiatric: She has a normal mood and affect. Her behavior is normal. Judgment and thought content normal.        Coding    Procedures    Patient has been reassessed.  Feeling better.  Reviewed labs, medications and radiographics with patient.  Ready to discharge home.      Discussed case with attending Physician Rodolph Bong.  Agrees with care and will D/C with follow up.       [name] results have been reviewed with them.  Patient and/or family have verbally conveyed their understanding and agreement of the patient's signs, symptoms, diagnosis, treatment and prognosis and additionally agree to follow up as recommended or return to the Emergency Room should their condition change prior to follow-up.  Discharge instructions have also been provided to the patient with some educational information regarding their diagnosis as well a list of reasons why they would want to return to the  ER prior to their follow-up appointment should their condition change.  Faustina Gebert N. Herbert Deaner, PA-C

## 2009-06-18 NOTE — ED Notes (Signed)
PA has reviewed discharge instructions with the patient.  The patient verbalized understanding.

## 2009-06-18 NOTE — ED Notes (Signed)
Pt's family at bedside asking questions about pt's treatment.

## 2009-06-19 LAB — EKG, 12 LEAD, INITIAL
Atrial Rate: 68 {beats}/min
Calculated P Axis: 66 degrees
Calculated R Axis: 59 degrees
Calculated T Axis: 40 degrees
Diagnosis: NORMAL
P-R Interval: 164 ms
Q-T Interval: 394 ms
QRS Duration: 94 ms
QTC Calculation (Bezet): 418 ms
Ventricular Rate: 68 {beats}/min

## 2009-06-21 NOTE — ED Notes (Signed)
RN Follow-up:  Pt. Called wanting results of urine culture.  Check chart, not done--urine  Was clear, no WBC or bacteria.  Called and left message on home phone.  Left my name and voice mail number if needed.

## 2009-08-08 ENCOUNTER — Ambulatory Visit (HOSPITAL_COMMUNITY): Payer: Self-pay | Admitting: Psychiatry

## 2009-10-08 ENCOUNTER — Ambulatory Visit (HOSPITAL_COMMUNITY): Payer: Self-pay | Admitting: Psychiatry

## 2010-01-23 ENCOUNTER — Ambulatory Visit (HOSPITAL_COMMUNITY): Payer: Self-pay | Admitting: Psychiatry

## 2010-03-06 ENCOUNTER — Ambulatory Visit (HOSPITAL_COMMUNITY): Payer: Self-pay | Admitting: Psychiatry

## 2010-05-06 ENCOUNTER — Ambulatory Visit (HOSPITAL_COMMUNITY): Payer: Self-pay | Admitting: Psychiatry

## 2010-07-12 ENCOUNTER — Encounter: Payer: Self-pay | Admitting: Cardiology

## 2010-07-13 ENCOUNTER — Emergency Department (HOSPITAL_COMMUNITY)
Admission: EM | Admit: 2010-07-13 | Discharge: 2010-07-13 | Disposition: A | Discharge status: Home / Self Care | Payer: Commercial Indemnity | Attending: Emergency Medicine | Admitting: Emergency Medicine

## 2010-07-13 ENCOUNTER — Emergency Department (HOSPITAL_COMMUNITY): Payer: Commercial Indemnity

## 2010-07-13 DIAGNOSIS — R05 Cough: Secondary | ICD-10-CM | POA: Insufficient documentation

## 2010-07-13 DIAGNOSIS — J45909 Unspecified asthma, uncomplicated: Secondary | ICD-10-CM | POA: Insufficient documentation

## 2010-07-13 DIAGNOSIS — E785 Hyperlipidemia, unspecified: Secondary | ICD-10-CM | POA: Insufficient documentation

## 2010-07-13 DIAGNOSIS — R059 Cough, unspecified: Secondary | ICD-10-CM | POA: Insufficient documentation

## 2010-07-13 DIAGNOSIS — I10 Essential (primary) hypertension: Secondary | ICD-10-CM | POA: Insufficient documentation

## 2010-07-13 DIAGNOSIS — M79609 Pain in unspecified limb: Secondary | ICD-10-CM | POA: Insufficient documentation

## 2010-07-13 DIAGNOSIS — E119 Type 2 diabetes mellitus without complications: Secondary | ICD-10-CM | POA: Insufficient documentation

## 2010-07-13 DIAGNOSIS — R0789 Other chest pain: Secondary | ICD-10-CM | POA: Insufficient documentation

## 2010-07-13 DIAGNOSIS — K219 Gastro-esophageal reflux disease without esophagitis: Secondary | ICD-10-CM | POA: Insufficient documentation

## 2010-07-13 LAB — BASIC METABOLIC PANEL
Calcium: 8.6 mg/dL (ref 8.4–10.5)
Chloride: 108 mEq/L (ref 96–112)
GFR calc Af Amer: >60 mL/min (ref >60)
GFR calc non Af Amer: >60 mL/min (ref >60)
Sodium: 136 mEq/L (ref 135–145)

## 2010-07-13 LAB — DIFFERENTIAL
Basophils Relative: 0 % (ref 0–1)
Eosinophils Absolute: 0.1 10*3/uL (ref 0.0–0.7)
Eosinophils Relative: 1 % (ref 0–5)
Lymphocytes Relative: 37 % (ref 12–46)
Lymphs Abs: 4.6 10*3/uL — ABNORMAL HIGH (ref 0.7–4.0)
Monocytes Relative: 7 % (ref 3–12)
Neutro Abs: 6.9 10*3/uL (ref 1.7–7.7)

## 2010-07-13 LAB — CBC
MCH: 28.3 pg (ref 26.0–34.0)
MCHC: 33.6 g/dL (ref 30.0–36.0)
MCV: 84.4 fL (ref 78.0–100.0)
Platelets: 287 10*3/uL (ref 150–400)
RBC: 4.8 MIL/uL (ref 3.87–5.11)

## 2010-07-13 LAB — POCT CARDIAC MARKERS
CKMB, poc: 1.5 ng/mL (ref 1.0–8.0)
CKMB, poc: 1.1 ng/mL (ref 1.0–8.0)
Myoglobin, poc: 39.5 ng/mL (ref 12–200)
Troponin i, poc: <0.05 ng/mL (ref 0.00–0.09)

## 2010-07-13 LAB — GLUCOSE, CAPILLARY: Glucose-Capillary: 118 mg/dL — ABNORMAL HIGH (ref 70–99)

## 2010-07-14 ENCOUNTER — Encounter: Payer: Self-pay | Admitting: Cardiology

## 2010-07-22 ENCOUNTER — Encounter: Payer: Self-pay | Admitting: Cardiology

## 2010-07-22 ENCOUNTER — Ambulatory Visit (INDEPENDENT_AMBULATORY_CARE_PROVIDER_SITE_OTHER): Payer: Commercial Indemnity | Admitting: Cardiology

## 2010-07-22 DIAGNOSIS — R079 Chest pain, unspecified: Secondary | ICD-10-CM | POA: Insufficient documentation

## 2010-07-22 DIAGNOSIS — R0602 Shortness of breath: Secondary | ICD-10-CM | POA: Insufficient documentation

## 2010-07-22 DIAGNOSIS — R072 Precordial pain: Secondary | ICD-10-CM

## 2010-07-24 NOTE — Letter (Signed)
Summary: External Correspondence/ [facility]  External Correspondence/ [facility]   Imported By: Dorise Hiss 07/15/2010 11:36:51  _____________________________________________________________________  External Attachment:    Type:   Image     Comment:   External Document

## 2010-07-24 NOTE — Medication Information (Signed)
Summary: RX Folder/ [facility]  RX Folder/ [facility]   Imported By: Dorise Hiss 07/15/2010 11:44:55  _____________________________________________________________________  External Attachment:    Type:   Image     Comment:   External Document

## 2010-07-25 MED ORDER — AZITHROMYCIN 250 MG TAB
250 mg | PACK | ORAL | Status: DC
Start: 2010-07-25 — End: 2011-07-20

## 2010-07-25 MED ORDER — CODEINE-GUAIFENESIN 10 MG-100 MG/5 ML SYRUP
10-100 mg/5 mL | Freq: Three times a day (TID) | ORAL | Status: AC | PRN
Start: 2010-07-25 — End: 2010-08-04

## 2010-07-25 NOTE — Progress Notes (Signed)
Chief Complaint   Patient presents with   ??? Cold Symptoms     cough sob, nasal drainage, lethargic, anorexic.

## 2010-07-25 NOTE — Progress Notes (Signed)
Chief Complaint   Patient presents with   ??? Cold Symptoms     cough sob, nasal drainage, lethargic, anorexic.       HPI:  Sara Cooke is a 40 y.o. female who presents with productive cough with green mucus, SOB, congestion with yellow mucus, lethargy, anorexia, laryngitis, chills, "PVCs" and belching for 4-5 days. "PVC" sensation may be from decongestant. Denies fever, chills, ST, wheezing. Has tried Nyquil, Robitussin, Aleve. Was prescribed Tessalon from another provider which is helping cough. Concerned this is pneumonia.   ROS:  Review of systems negative, except as noted in HPI.    Meds:  Current outpatient prescriptions:codeine-guaiFENesin (ROBITUSSIN-AC) 10-100 mg/5 mL syrup, Take 5 mL by mouth three (3) times daily as needed for Cough for 10 days., Disp: 100 mL, Rfl: 0;  azithromycin (ZITHROMAX) 250 mg tablet, Take 2 tablets today and 1 tablet daily on Days 2-5., Disp: 1 Package, Rfl: 0;  levothyroxine (LEVOXYL) 88 mcg tablet, Take  by mouth Daily (before breakfast).  , Disp: , Rfl:   chlorthalidone (HYGROTEN) 25 mg tablet, Take  by mouth daily.  , Disp: , Rfl: ;  potassium chloride (K-DUR) 20 mEq tablet, Take  by mouth two (2) times a day.  , Disp: , Rfl: ;  benzonatate (TESSALON PERLES) 100 mg capsule, Take 100 mg by mouth three (3) times daily as needed.  , Disp: , Rfl: ;  NAPROXEN SODIUM (ALEVE PO), Take  by mouth.  , Disp: , Rfl:   pseudoephedrine-guaiFENesin (MUCINEX D) 60-600 mg per tablet, Take 1 Tab by mouth every twelve (12) hours.  , Disp: , Rfl: ;  ergocalciferol (VITAMIN D) 50,000 unit capsule, Take 50,000 Units by mouth.  , Disp: , Rfl: ;  montelukast (SINGULAIR) 10 mg tablet, Take 10 mg by mouth daily.  , Disp: , Rfl: ;  Cetirizine (ZYRTEC) 10 mg Cap, Take  by mouth.  , Disp: , Rfl: ;  DM/P-EPHED/ACETAMINOPH/DOXYLAM (NYQUIL PO), Take  by mouth.  , Disp: , Rfl:    Azelastine (ASTEPRO) 0.15 % (205.5 mcg) nasal spray, two (2) times a day.  , Disp: , Rfl: ;  ALBUTEROL IN, Take  by inhalation.  , Disp: , Rfl: ;  ASCORBATE CALCIUM (VITAMIN C PO), Take  by mouth.  , Disp: , Rfl:    Allergies:  Allergies   Allergen Reactions   ??? Percocet (Oxycodone-Acetaminophen) Nausea and Vomiting   ??? Wellbutrin (Bupropion Hcl) Itching     FH:    No family history on file.      Physical Exam:  BP 106/63   Pulse 80   Temp(Src) 98.3 ??F (36.8 ??C) (Oral)   Resp 20   Ht 5\' 6"  (1.676 m)   Wt 163 lb (73.936 kg)   BMI 26.31 kg/m2   SpO2 99%   LMP 07/16/2010    GEN: No apparent distress. Alert and oriented.  EYES:  Conjunctiva clear; pupils round, reactive to light; extraocular movements intact.   EAR: External ears are normal.  Tympanic membranes are clear and without effusion.  NOSE: Turbinates are within normal limits.  No drainage. No sinus tenderness.   OROPHYARYNX: Mildly erythematous pharynx with PND.   NECK:  Supple, no masses or LAD.      LUNGS: Respirations unlabored;clear to auscultation bilaterally. No wheezes or rhonchi. No egophony.   CARDIOVASCULAR: Regular, rate, and rhythm without murmurs, gallops or rubs   NEUROLOGIC:  Strength, sensation, coordination and gait grossly intact.   SKIN: No obvious rashes.  Assessment:  1. URI (upper respiratory infection)  azithromycin (ZITHROMAX) 250 mg tablet   2. Cough  codeine-guaiFENesin (ROBITUSSIN-AC) 10-100 mg/5 mL syrup       Plan:  Discussed at length that an antibiotic was not indicated.   Printed Rx for Zpak only if sx did not improve in next 3 days.  Prescribed Robitussin AC at length.   Advised this was not pneumonia.   Advised if sx fail to improve or worsen to RTO.   If new concerning sx, including CP, SOB, high fever or change in consciousness occur,   pt should RTO or go to ED as necessary.  Pt agreed with POC.

## 2010-07-25 NOTE — Patient Instructions (Signed)
Acute Cough: After Your Visit  Your Care Instructions  A cough is the body's way of keeping the lungs clear. A cough can be short-term (acute) or long-term (chronic). An acute cough lasts less than 3 weeks.  A cough is not a disease but is a symptom of a health problem. An acute cough is often caused by a cold or other upper respiratory tract illness.  There are different types of coughs:  ?? A productive cough brings up mucus from the lungs.   ?? A nonproductive cough is a dry cough that does not bring up mucus. You may get a dry, hacking cough after a cold or after being exposed to dust or smoke.   Follow-up care is a key part of your treatment and safety. Be sure to make and go to all appointments, and call your doctor if you are having problems. It's also a good idea to know your test results and keep a list of the medicines you take.  How can you care for yourself at home?  ?? Fluids may soothe an irritated throat. Honey in hot water, tea, or lemon juice helps a dry, hacking cough.   ?? Prop up your head with extra pillows at night to ease a dry cough.   ?? Try a cough drop to soothe your throat. Expensive medicine-flavored cough drops are no better than inexpensive candy-flavored drops or hard candy. Most cough drops don't stop a cough.   ?? Do not smoke. Smoking can make a cough worse. If you need help quitting, talk to your doctor about stop-smoking programs and medicines. These can increase your chances of quitting for good.   ?? Avoid exposure to smoke, dust, or other pollutants, or wear a face mask that is appropriate for the exposure. Check with your doctor or pharmacist to find out which type of face mask will give you the most benefit.   ?? If your doctor prescribes cough medicine, take it exactly as prescribed. Call your doctor if you think you are having a problem with your medicine. Do not take someone else's prescription cough medicine.    ?? If you are not taking prescription cough medicine, ask your doctor if you can take over-the-counter cough medicine.   ?? Expectorant cough medicines help thin the mucus and make it easier to cough mucus up when you have a productive cough. Look for expectorants that contain guaifenesin, such as Mucinex, Robitussin, or Vicks 44 Chesty Cough.   ?? Suppressant cough medicines control or suppress the cough reflex and work best for a dry, hacking cough that keeps you awake. Look for suppressant medicines that contain dextromethorphan, such as Robitussin-DM or Vicks 44 Dry Cough Suppressant.   ?? Avoid cough medicines that treat more than a cough. For example, don't use a medicine that treats a cough and a stuffy nose.   ?? Be careful when taking over-the-counter cold or flu medicines and Tylenol at the same time. Many of these medicines have acetaminophen, which is Tylenol. Read the labels to make sure that you are not taking more than the recommended dose. Too much acetaminophen (Tylenol) can be harmful.   When should you call for help?  Call 911 anytime you think you may need emergency care. For example, call if:  ?? You have severe trouble breathing.   Call your doctor now or seek immediate medical care if:  ?? You have new or increased shortness of breath.   ?? You have a new or higher   fever.   ?? You have new symptoms, such as coughing up blood.   ?? You feel much worse.   Watch closely for changes in your health, and be sure to contact your doctor if you are not getting better as expected.    Where can you learn more?    Go to http://www.healthwise.net/[facility]   Enter X871 in the search box to learn more about "Acute Cough: After Your Visit."     ?? 2006-2012 [employer]. Care instructions adapted under license by [employer] (which disclaims liability or warranty for this information). This care instruction is for use with your licensed healthcare professional. If you have questions about a medical condition or this instruction, always ask your healthcare professional. [employer], Inc[employer] disclaims any warranty or liability for your use of this information.  Content Version: 9.2.102713; Last Revised: January 07, 2009

## 2010-07-28 ENCOUNTER — Encounter (INDEPENDENT_AMBULATORY_CARE_PROVIDER_SITE_OTHER): Payer: Commercial Indemnity | Admitting: Physician Assistant

## 2010-07-28 ENCOUNTER — Encounter: Payer: Self-pay | Admitting: Physician Assistant

## 2010-07-28 ENCOUNTER — Encounter: Payer: Self-pay | Admitting: Cardiology

## 2010-07-28 ENCOUNTER — Other Ambulatory Visit: Payer: Self-pay | Admitting: Cardiology

## 2010-07-28 ENCOUNTER — Other Ambulatory Visit (INDEPENDENT_AMBULATORY_CARE_PROVIDER_SITE_OTHER): Payer: Commercial Indemnity

## 2010-07-28 DIAGNOSIS — R0602 Shortness of breath: Secondary | ICD-10-CM

## 2010-07-28 DIAGNOSIS — R079 Chest pain, unspecified: Secondary | ICD-10-CM

## 2010-07-29 ENCOUNTER — Ambulatory Visit: Payer: Commercial Indemnity | Admitting: Cardiology

## 2010-07-29 NOTE — Assessment & Plan Note (Signed)
Summary: NP-CHEST PAIN AT REST-SRS  Medications Added PROTONIX 40 MG  TBEC (PANTOPRAZOLE SODIUM) Take 1 tablet by mouth once a day ZOLPIDEM TARTRATE 10 MG TABS (ZOLPIDEM TARTRATE) as needed LISINOPRIL 10 MG TABS (LISINOPRIL) Take one tablet by mouth daily LEVOTHYROXINE SODIUM 175 MCG TABS (LEVOTHYROXINE SODIUM) Take 1 tablet by mouth once a day FENOFIBRATE 160 MG TABS (FENOFIBRATE) Take 1 tab by mouth at bedtime TOPAMAX 25 MG TABS (TOPIRAMATE) 3 at bedtime PRAVASTATIN SODIUM 80 MG TABS (PRAVASTATIN SODIUM) Take one tablet by mouth daily at bedtime ASPIRIN 81 MG TBEC (ASPIRIN) Take one tablet by mouth daily PANTOPRAZOLE SODIUM 40 MG TBEC (PANTOPRAZOLE SODIUM) Take 1 tablet by mouth two times a day HYDROCODONE-ACETAMINOPHEN 2.5-500 MG TABS (HYDROCODONE-ACETAMINOPHEN) as needed CYCLOBENZAPRINE HCL 10 MG TABS (CYCLOBENZAPRINE HCL) as needed BUPROPION HCL 300 MG XR24H-TAB (BUPROPION HCL) Take 1 tablet by mouth once a day LAMOTRIGINE 200 MG TABS (LAMOTRIGINE) Take 1 tablet by mouth once a day ACTOS 15 MG TABS (PIOGLITAZONE HCL) Take 1 tablet by mouth once a day ALBUTEROL SULFATE (2.5 MG/3ML) 0.083% NEBU (ALBUTEROL SULFATE) as needed CHERATUSSIN AC 100-10 MG/5ML SYRP (GUAIFENESIN-CODEINE) as needed      Allergies Added: ! SULFA ! ERYTHROMYCIN  Visit Type:  Initial Consult Primary Provider:  Prudy Feeler, NP  CC:  chest pain.  History of Present Illness: The patient presents for evaluation of chest discomfort. She was seen in urgent care on February 26. I reviewed these records. She had chest discomfort but no objective evidence of ischemia.  She was not admitted to the hospital. However, she is referred for further evaluation. She said the pain was initially in her shoulder blade. She subsequently had stabbing pain radiating through to her shoulder blade and in her chest. It was 10 out of 10. She gets also some chest tightness. She thinks it does hurt occasionally take a deep breath. It will  hurt if she lifts 50 pounds or more. However, she hasn't been back at work recently. She is not describing new PND or orthopnea. She is not describing new palpitations, presyncope or syncope.  However, she does get dyspnea with moderate exertion such as walking moderate distances on level ground.  Current Medications (verified): 1)  Protonix 40 Mg  Tbec (Pantoprazole Sodium) .... Take 1 Tablet By Mouth Once A Day 2)  Zolpidem Tartrate 10 Mg Tabs (Zolpidem Tartrate) .... As Needed 3)  Lisinopril 10 Mg Tabs (Lisinopril) .... Take One Tablet By Mouth Daily 4)  Levothyroxine Sodium 175 Mcg Tabs (Levothyroxine Sodium) .... Take 1 Tablet By Mouth Once A Day 5)  Fenofibrate 160 Mg Tabs (Fenofibrate) .... Take 1 Tab By Mouth At Bedtime 6)  Topamax 25 Mg Tabs (Topiramate) .... 3 At Bedtime 7)  Pravastatin Sodium 80 Mg Tabs (Pravastatin Sodium) .... Take One Tablet By Mouth Daily At Bedtime 8)  Aspirin 81 Mg Tbec (Aspirin) .... Take One Tablet By Mouth Daily 9)  Pantoprazole Sodium 40 Mg Tbec (Pantoprazole Sodium) .... Take 1 Tablet By Mouth Two Times A Day 10)  Hydrocodone-Acetaminophen 2.5-500 Mg Tabs (Hydrocodone-Acetaminophen) .... As Needed 11)  Cyclobenzaprine Hcl 10 Mg Tabs (Cyclobenzaprine Hcl) .... As Needed 12)  Bupropion Hcl 300 Mg Xr24h-Tab (Bupropion Hcl) .... Take 1 Tablet By Mouth Once A Day 13)  Lamotrigine 200 Mg Tabs (Lamotrigine) .... Take 1 Tablet By Mouth Once A Day 14)  Actos 15 Mg Tabs (Pioglitazone Hcl) .... Take 1 Tablet By Mouth Once A Day 15)  Albuterol Sulfate (2.5 Mg/49ml) 0.083% Nebu (Albuterol  Sulfate) .... As Needed 16)  Cheratussin Ac 100-10 Mg/57ml Syrp (Guaifenesin-Codeine) .... As Needed  Allergies (verified): 1)  ! Sulfa 2)  ! Erythromycin  Past History:  Past Medical History: THYROID DISORDER (ICD-246.9) CHOLELITHIASIS, HX OF (ICD-V12.79) MIGRAINE HEADACHE (ICD-346.90) NEPHROLITHIASIS, HX OF (ICD-V13.01) ANXIETY DEPRESSION (ICD-300.4) ARTHRITIS  (ICD-716.90) HYPERLIPIDEMIA (ICD-272.4) ASTHMA (ICD-493.90) HYPERTENSION (ICD-401.9)  Past Surgical History: Cholecystectomy CTS surgery both wrists C-section  Tonsillectomy Laproscopy  Family History: History of breast cancer, paternal grandmother. There is no early onset heart disease in first-degree relatives.  Social History: Married.  No tobacco (quit 20 years ago) or alcohol use.   Review of Systems       Positive for headaches, cough, occasional wheezing, constipation, reflux, joint pains. Otherwise as stated in the history of present illness negative for all other systems.  Vital Signs:  Patient profile:   41 year old female Height:      68 inches Weight:      282.75 pounds BMI:     43.15 Pulse rate:   89 / minute Pulse rhythm:   regular Resp:     18 per minute BP sitting:   108 / 73  (left arm) Cuff size:   large  Vitals Entered By: Vikki Ports (July 22, 2010 4:42 PM)  Physical Exam  General:  Well developed, well nourished, in no acute distress. Head:  normocephalic and atraumatic Eyes:  PERRLA/EOM intact; conjunctiva and lids normal. Mouth:  Teeth, gums and palate normal. Oral mucosa normal. Neck:  Neck supple, no JVD. No masses, thyromegaly or abnormal cervical nodes. Chest Wall:  no deformities or breast masses noted Lungs:  Clear bilaterally to auscultation and percussion. Abdomen:  Bowel sounds positive; abdomen soft and non-tender without masses, organomegaly, or hernias noted. No hepatosplenomegaly, obese Msk:  Back normal, normal gait. Muscle strength and tone normal. Extremities:  No clubbing or cyanosis. Neurologic:  Alert and oriented x 3. Skin:  Intact without lesions or rashes. Cervical Nodes:  no significant adenopathy Inguinal Nodes:  no significant adenopathy Psych:  Normal affect.   Detailed Cardiovascular Exam  Neck    Carotids: Carotids full and equal bilaterally without bruits.      Neck Veins: Normal, no JVD.    Heart     Inspection: no deformities or lifts noted.      Palpation: normal PMI with no thrills palpable.      Auscultation: regular rate and rhythm, S1, S2 without murmurs, rubs, gallops, or clicks.    Vascular    Abdominal Aorta: no palpable masses, pulsations, or audible bruits.      Femoral Pulses: normal femoral pulses bilaterally.      Pedal Pulses: normal pedal pulses bilaterally.      Radial Pulses: normal radial pulses bilaterally.      Peripheral Circulation: no clubbing, cyanosis, or edema noted with normal capillary refill.     EKG  Procedure date:  07/22/2010  Findings:      Sinus rhythm, rate 89, axis within normal limits, intervals within normal limits, no acute ST-T wave changes  Impression & Recommendations:  Problem # 1:  CHEST PAIN (ICD-786.50) Her chest pain is atypical greater than typical.  i think the pretest probability of obstructive coronary disease is low but she does have risk factors. Exercise treadmill testing as indicated. Orders: EKG w/ Interpretation (93000) Treadmill (Treadmill)     Problem # 2:  DYSPNEA (ICD-786.05) I will check a BNP leve. If it is normal no further cardiovascular testing will  be planned. Orders: EKG w/ Interpretation (93000) Treadmill (Treadmill)  Problem # 3:  HYPERTENSION (ICD-401.9) Her blood pressure is controlled. She was recently switched to ACE inhibitors. No change in therapy is indicated.  Patient Instructions: 1)  Your physician recommends that you schedule a follow-up appointment at the time of your Treadmill 2)  Your physician recommends that you return for lab work:  B. Nat Peptide   3)  Your physician recommends that you continue on your current medications as directed. Please refer to the Current Medication list given to you today. 4)  Your physician has requested that you have an exercise tolerance test.  For further information please visit [email or web address]  Please also follow instruction sheet, as given.

## 2010-08-19 ENCOUNTER — Encounter (INDEPENDENT_AMBULATORY_CARE_PROVIDER_SITE_OTHER): Payer: Commercial Indemnity | Admitting: Psychiatry

## 2010-08-19 DIAGNOSIS — F3189 Other bipolar disorder: Secondary | ICD-10-CM

## 2010-09-30 NOTE — H&P (Signed)
NAMEALENCIA, GORDON                  ACCOUNT NO.:  [id number]   MEDICAL RECORD NO.:  [id number]          PATIENT TYPE:  OIB   LOCATION:  1526                         FACILITY:  [facility]   PHYSICIAN:  Adolph Pollack, M.D.DATE OF BIRTH:  1971-04-05   DATE OF ADMISSION:  04/08/2007  DATE OF DISCHARGE:                              HISTORY & PHYSICAL   REASON:  Thyroidectomy.   HISTORY OF PRESENT ILLNESS:  Ms. Riesen is a 40 year old female who was  felt to have some cervical type dysphasia.  She also has  hyperthyroidism.  It was felt that she may have some achalasia based on  studies in the past.  However during her workup, an upper GI was  performed and this was completely normal.  It was felt that because of a  multinodular goiter, that these were cervical compression symptoms  rather than achalasia or clinically significant achalasia.  Because of  her symptomatic goiter and hyperthyroidism, she now presents for total  thyroidectomy.   PAST MEDICAL HISTORY:  1. Hypothyroidism.  2. Multinodular goiter.  3. Bipolar disorder.  4. Depression.  5. Type 2 diabetes mellitus.  6. Asthma.  7. Hyperlipidemia.  8. Migraine headaches.  9. Pneumonia.  10.Presumptive diagnosis of achalasia.   PAST SURGICAL HISTORY:  1. Laparoscopic cholecystectomy.  2. Cesarean section.  3. Laparoscopy.  4. Right and left carpal tunnel release.  5. Tonsillectomy.  6. Wisdom tooth extraction.   ALLERGIES:  1. SULFA.  2. ERYTHROMYCIN.  3. MORPHINE.   CURRENT MEDICATIONS:  1. Flagyl.  2. Simvastatin.  3. Protonix.  4. PTU.  5. Alpha methyldopa.  6. Topamax.  7. Wellbutrin XL.  8. Glucophage.  9. Allegra-D.  10.Singulair.  11.Abilify.  12.Loestrin birth control pill.   SOCIAL HISTORY:  Married.  No tobacco or alcohol use.   REVIEW OF SYSTEMS:  Notable for being treated for a tooth infection.   PHYSICAL EXAMINATION:  GENERAL:  An obese female in no acute distress.  She is pleasant and  cooperative.  VITAL SIGNS:  Temperature is 98.4,  blood pressure is 128/81, pulse 91.  HEENT:  Eyes:  Extraocular motions are intact.  No icterus.  NECK:  Demonstrates a fullness that is palpable to the left with slight  fullness to the right, although she has a short, stocky neck.  RESPIRATORY:  Breath sounds are equal and clear.  Respirations are  unlabored.  CARDIOVASCULAR:  Regular rate and regular rhythm.  No  murmur.  ABDOMEN:  Soft, multiple small scars, no tenderness.  MUSCULOSKELETAL:  SCD hose on.   IMPRESSION:  Symptomatic thyroid goiter as well as hyperthyroidism.   PLAN:  Total thyroidectomy.   We have discussed the procedure, rationale and risks.  In discussion  with her endocrinologist preoperatively, it was not felt that she needed  any type of potassium iodide, that she was well controlled on PTU.      Adolph Pollack, M.D.  Electronically Signed     TJR/MEDQ  D:  04/08/2007  T:  04/09/2007  Job:  [id number]

## 2010-09-30 NOTE — Op Note (Signed)
Judith [name], Judith [name]                  ACCOUNT NO.:  [id number]   MEDICAL RECORD NO.:  [id number]          PATIENT TYPE:  OIB   LOCATION:  1526                         [facility]ACILITY:  [facility]   PHYSICIAN:  Adolph Pollack, M.D.DATE OF BIRTH:  [birth year]   DATE OF PROCEDURE:  04/08/2007  DATE OF DISCHARGE:                               OPERATIVE REPORT   PREOPERATIVE DIAGNOSES:  Hyperthyroidism with symptomatic thyroid  goiter.   POSTOPERATIVE DIAGNOSES:  Hyperthyroidism with symptomatic thyroid  goiter.   PROCEDURE:  Total thyroidectomy.   SURGEON:  Adolph Pollack, M.D.   ASSISTANT:  Baruch Merl, M.D.   ANESTHESIA:  General.   INDICATIONS:  This 40 year old female has some compressive symptoms from  a goiter as well as hyperthyroidism and now presents for total  thyroidectomy.   TECHNIQUE:  She is seen in the holding area and was noted be slightly  hoarse here.  She is then brought to the operating room, placed supine  on the operating table and a general anesthetic was administered.  Her  neck was placed in slight extension and the neck and upper chest were  sterilely prepped and draped.  A low transverse incision was made  through the skin, subcutaneous tissue and platysma muscle.  Subplatysmal  flaps were raised superiorly to the laryngeal cartilage and inferiorly  to the suprasternal notch.  The precervical fascia between the strap  muscles was divided. The strap muscles were dissected free from the left  lobe of the thyroid gland which was enlarged.  The inferior pole of  thyroid gland was identified.  Dissection of the thyroid gland was  performed and inferior vessels were divided between clips and with use  of a harmonic scalpel mobilizing the inferior lobe of the thyroid gland  on the left side.  The superior pole was then identified. The superior  pole vessels were then identified and divided between sutures as well as  clips. I approached the mid section of the  thyroid gland and divided  some of the middle vessels between clips close to the gland. The left  recurrent laryngeal nerve was identified as well as a superior  parathyroid gland.  The superior parathyroid gland was dissected from  the thyroid gland and was viable.  I stayed above the plane of the  recurrent laryngeal nerve.  The thyroid gland was dissected off the  trachea using electrocautery.  The pyramidalis and thyroid isthmus were  then dissected free from trachea.  No significant bleeding was noted at  the time and a dry sponge was placed in the left side of the neck.  The  recurrent laryngeal nerve was intact.   I then approached the right side. I mobilized the gland free from the  strap muscles using blunt dissection. I approached the inferior aspect  of the gland and identified inferior vessels close to the thyroid gland  and these were clipped and then divided using the harmonic scalpel  mobilizing the inferior lobe.  I then approached the superior lobe and  identified superior lobe vessels.  These were isolated, clipped and  divided close to the thyroid gland.  I identified the superior  parathyroid gland and swept it away from the thyroid.  I then divided  the middle veins close to the thyroid using clips and harmonic scalpel.  The recurrent laryngeal nerve was identified.  There was a lip of  thyroid tissue that was basically wrapped around the recurrent nerve  from a medial to lateral position.  Based on this, I excised the thyroid  off the trachea leaving a small bit of thyroid tissue adherent to the  recurrent laryngeal nerve.  Some bleeding ensued from this remaining  thyroid tissue. I held direct pressure here and placed Surgicel.  This  controlled the bleeding.   I then went back and some inspected the left side of the neck and  irrigated it.  A small bleeding point in the trachea was identified and  was controlled with electrocautery.  Surgicel was placed on the  left  side of the neck. The right side was then reexamined and it was  hemostatic.   Following this, the strap muscles were reapproximated with interrupted 3-  0 Vicryl sutures.  The platysma muscle was reapproximated with  interrupted 3-0 Vicryl sutures.  The skin was closed with a running 4-0  Monocryl subcuticular stitch followed by Steri-Strips and sterile  dressing.   She tolerated the procedure well without any apparent complications and  was taken to the recovery room in satisfactory condition.      Adolph Pollack, M.D.  Electronically Signed     TJR/MEDQ  D:  04/08/2007  T:  04/09/2007  Job:  [id number]   cc:   Mathis Bud, MD  [facility] D. Arlyce Dice, MD,FACG  [address]  [place]  [place] [address]   Samuel Jester  Fax: [phone]

## 2010-09-30 NOTE — Assessment & Plan Note (Signed)
[facility]                         GASTROENTEROLOGY OFFICE NOTE   NAME:[name], Judith R                         MRN:          [id number]  DATE:11/16/2006                            DOB:          [birth year]    PROBLEM:  Dysphagia.   HISTORY:  Judith [name] has returned following a Botox injection of her  LES.  She noted marked improvement in her dysphagia.  Evaluation is  performed on August 18, 2006.  Over the last few weeks she has noted  recurrence of her dysphagia.  She now has a feeling of globus again and  regurgitation of gastric contents.  She also has a history of thyroid  disease and apparently was undergoing evaluation for a possible  thyroidectomy.   PHYSICAL EXAMINATION:  VITAL SIGNS:  Pulse 96, blood pressure 114/84,  weight 273 pounds.   IMPRESSION:  1. Achalasia.  2. Questionable history of thyroid disease.   RECOMMENDATIONS:  1. Repeat Botox injection.  2. Refer to surgery for consideration of a surgical myotomy of the      LES.   I will contact Dr. Velora Heckler to see whether he can evaluate [name] for both her esophageal problem and for her thyroid disease.     Barbette Hair. Arlyce Dice, MD,FACG  Electronically Signed    RDK/MedQ  DD: 11/16/2006  DT: 11/16/2006  Job #: [id number]   cc:   Mathis Bud, M.D.  Velora Heckler, MD
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
## 2010-09-30 NOTE — Assessment & Plan Note (Signed)
Judith [name], Judith [name]                  ACCOUNT NO.:  [id number]   MEDICAL RECORD NO.:  [id number]          PATIENT TYPE:  POB   LOCATION:  [facility] at [facility]         FACILITY:  [facility]   PHYSICIAN:  Tinnie Gens, MD        DATE OF BIRTH:  01-Mar-1971   DATE OF SERVICE:                                  CLINIC NOTE   CHIEF COMPLAINT:  Yearly exam.   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  She is without complaint today.  She had a Mirena IUD  placed approximately 1 month ago and is having some spotting related to  that but is otherwise doing well.  Her past medical history is  significant for obesity, gastroesophageal reflux disease,  hypothyroidism, diabetes mellitus, migraine headache, bipolar disorder.   PAST SURGICAL HISTORY:  C-section x1, tonsillectomy, cholecystectomy,  thyroidectomy.   MEDICATIONS:  Imitrex as needed, Glucophage 500 one p.o. b.i.d.,  Wellbutrin XL 300 one p.o. daily, Vytorin 1 p.o. daily, Protonix 20 mg 1  p.o. b.i.d., Abilify 20 mg 1 p.o. daily, Synthroid 175 mcg 1 p.o. daily,  calcium suppository 1 p.o. daily, methyldopa 1 p.o. b.i.d.   ALLERGIES:  ERYTHROMYCIN, KEFLEX, SULFA, BIAXIN.   OBSTETRICAL HISTORY:  She is a para 1.  During that pregnancy, she had  gestational diabetes,  had an LGA fetus of 10 pounds, 11 ounces.  Had  PIH during that pregnancy as well.   GYNECOLOGIC HISTORY:  No history of abnormal Pap smears.   SOCIAL HISTORY:  No tobacco, alcohol or drug use.  She works as a  Conservation officer, nature at [employer].   FAMILY HISTORY:  Shows a history of breast cancer, paternal grandmother.   REVIEW OF SYSTEMS:  Review of systems is reviewed and is negative.   She has a primary care doctor who follows her for most things.   PHYSICAL EXAMINATION:  VITAL SIGNS:  Her pulse is 77,  blood pressure  113/75, weight is 251 pounds.  GENERAL:  She is a morbidly obese female in no acute distress.  HEENT:  Normocephalic, atraumatic.  Sclerae are anicteric.  NECK:  Supple.  There is a scar from a previous thyroidectomy noted.  LUNGS:  Clear bilaterally.  CARDIOVASCULAR:  Regular rate and rhythm without rubs, gallops, murmurs.  ABDOMEN:  Soft, nontender, nondistended.  EXTREMITIES:  No cyanosis, clubbing or edema.  BREASTS:  Symmetric with everted nipples.  No masses.  No  supraclavicular or axillary adenopathy.  GU:  Normal external female genitalia.  The BUS was normal.  Vagina was  pink and rugated.  Cervix was nulliparous without lesion.  Uterus was  small, anteverted.  No adnexal mass or tenderness although true  defining of the uterus and adnexal structures was very difficult  secondary to body habitus.  In the vagina the IUD strings are  visualized.   IMPRESSION:  1. Yearly exam.  2. Multiple medical problems.   PLAN:  1. Pap smear today.  Can  follow up in 2 years after this.  2. Start yearly mammograms at age 68.  3. UTI.  The patient had a UTI diagnosed at her last visit and grew      out Staph agalactiae, which was resistant to Cipro, which she was      treated for.  She will receive a prescription for Macrobid now.      The patient also requests a prescription for treatment of yeast.      Prescription for Diflucan.  4. Follow up as needed.           ______________________________  Tinnie Gens, MD     TP/MEDQ  D:  06/29/2007  T:  06/30/2007  Job:  [id number]

## 2010-10-03 NOTE — Assessment & Plan Note (Signed)
[facility]                         GASTROENTEROLOGY OFFICE NOTE   NAME:[name], Judith R                         MRN:          [id number]  DATE:08/16/2006                            DOB:          11/19/1970    PROBLEM:  Hoarseness and dysphasia and sore throat.   Judith [name] has returned for a scheduled GI followup.   Upper endoscopy demonstrated a capacious esophagus.  In the past, she  has undergone esophageal dilatation without improvement.  Recent gastric  emptying scan showed minimal delay in gastric emptying.  Esophageal  manometry was abnormal, however.  This demonstrated about 10%  simultaneous contractions and incomplete relaxation of the lower  esophageal sphincter.  The residual pressure was 5 mm (normal less than  8 millimeters) presents relaxation with 79%.  Judith [name] continues to  complain of hoarseness and throat burning, despite twice a day Protonix.   EXAMINATION:  VITAL SIGNS:  Blood pressure 120/72, weight 278.   IMPRESSION:  Possible early achalasia.   RECOMMENDATIONS:  Upper endoscopy with botox injection at the LES.     Barbette Hair. Arlyce Dice, MD,FACG  Electronically Signed    RDK/MedQ  DD: 08/16/2006  DT: 08/16/2006  Job #: [id number]   cc:   Chaney Born, M.D.

## 2010-10-03 NOTE — Op Note (Signed)
NAMESAPHYRA, HUTT                  ACCOUNT NO.:  [id number]   MEDICAL RECORD NO.:  [id number]          PATIENT TYPE:  AMB   LOCATION:  ENDO                         FACILITY:  [facility]   PHYSICIAN:  Barbette Hair. Arlyce Dice, MD,FACGDATE OF BIRTH:  Aug 18, 1970   DATE OF PROCEDURE:  08/02/2006  DATE OF DISCHARGE:  08/02/2006                               OPERATIVE REPORT   PROCEDURE:  Esophageal manometry was performed.  It was a regular pull-  through technique.   FINDINGS:  1. Upper esophageal sphincter pressure contraction __________ and      relaxation were normal.  2. There were 90% peristalsis contractions in the body of the      esophagus; 10% were simultaneous contractions.  3. LES (lower esophageal segment) resting pressure was 29.7 mmHg      (normal 10-45).  Residual pressure was 5 mm and % relaxation 79%.   IMPRESSION:  Slightly nonspecific motor disorder of the esophagus with  decrease in LES (lower esophageal segment) relaxation suggestive of  possible early achalasia.   RECOMMENDATIONS:  Upper endoscopy with Botox injection.      Barbette Hair. Arlyce Dice, MD,FACG  Electronically Signed     RDK/MEDQ  D:  08/16/2006  T:  08/16/2006  Job:  [id number]

## 2010-10-03 NOTE — Group Therapy Note (Signed)
NAMEEMBERLIE, GOTCHER                  ACCOUNT NO.:  [id number]   MEDICAL RECORD NO.:  [id number]            PATIENT TYPE:   LOCATION[facility]  [facility]                     FACILITY:   PHYSICIAN:  Tinnie Gens, MD        DATE OF BIRTH:  Jan 02, 1971   DATE OF SERVICE:  08/25/2005                                    CLINIC NOTE   [facility]   CHIEF COMPLAINT:  ER followup.   HISTORY OF PRESENT ILLNESS:  The patient is a 40 year old, para 1 who is  diabetic and who on Sunday developed a left adnexal pain.  She stated that  the pain was sharp at times, but then could be dull and constant at other  times, but the shortness lasted 5-10 minutes or even longer.  It was worse  with movement, coughing, riding in a car and never really seemed to ease  off.  On Monday, the pain got acutely worse and she went to the ED at [facility].  She was told there that she had an infection in her bladder and she  had a CT.  I just have the report from the CT, which shows a 3.2 cm left  adnexal hyperdensity that is consistent with a complex left ovarian cyst,  and to follow up in 6 weeks and a normal appendix was visualized.  The  patient was denying any symptoms of urgency and dysuria, but she did say  that she was having difficulty completely voiding.  The patient reports a  history of this back as a teenager and she was treated for a UTI, and seemed  to get better.  The patient was started on Levaquin and has not necessarily  gotten in a heap better since then.  The patient was advised to follow up  with her gynecologist today.   PHYSICAL EXAMINATION:  VITAL SIGNS:  She is afebrile at 98, pulse is 94,  blood pressure 119/73 and weight is 276.  GENERAL: She is an obese female in no acute distress.  GU:  She has normal external female genitalia.  The vagina is pink and  rugated.  The uterus is small and anteverted.  The adnexa really could not  be appreciated secondary to habitus.  She  was slightly tender on exam, but  certainly had no cervical motion tenderness.   A transvaginal ultrasound was done.  The uterus appeared normal with a very  thin endometrial stripe.  Uterine measurements included:  It was 8.2 x 4.4,  the endometrial stripe was 2.2 mm, the right ovary was 3 x 2.2, the left  ovary was difficult to image and was questionably seen superior to the  uterus and measured 2.8 x 1.3, there was color flow in this general vicinity  and the vessel leading to the ovary appeared normal.  Abdominal scanning was  also done and revealed what was thought to be the left ovary at 3.9 x 2.3  with a small cyst noted on the inferior pole.  The right ovary was also  visualized with similar measurements to  what was obtained transvaginally.  No true abnormality was seen, but because the left ovary could not be seen  with confidence and because of the CT report, it was felt the patient would  best be imaged at the [facility] by Radiology and she was sent there  for that purpose.  She is to continue her Levaquin.   IMPRESSION:  1.  Acute left pelvic pain.  2.  Question urinary tract infection.  3.  Question left ovarian cyst.   PLAN:  Because the left ovary could not be visualized with confidence, it  was felt that the patient would best be served by going to the [facility] for Radiology to scan given the CT report, as well as her acute  pain to determine what sort of followup should be done.  This will be  determined after the ultrasound is performed.           ______________________________  Tinnie Gens, MD     TP/MEDQ  D:  08/25/2005  T:  08/25/2005  Job:  [id number]

## 2010-11-11 ENCOUNTER — Encounter (HOSPITAL_COMMUNITY): Payer: Commercial Indemnity | Admitting: Psychiatry

## 2011-02-24 LAB — CBC
Hemoglobin: 13.7
MCV: 80.3
WBC: 9

## 2011-02-24 LAB — DIFFERENTIAL
Basophils Absolute: 0
Eosinophils Relative: 1
Lymphocytes Relative: 27
Monocytes Absolute: 0.6
Monocytes Relative: 6
Neutrophils Relative %: 66

## 2011-02-24 LAB — COMPREHENSIVE METABOLIC PANEL
ALT: 24
AST: 20
Albumin: 3.5
Alkaline Phosphatase: 71
BUN: 11
Calcium: 9.1
Creatinine, Ser: 0.92
GFR calc non Af Amer: >60
Glucose, Bld: 136 — ABNORMAL HIGH

## 2011-02-24 LAB — URINALYSIS, ROUTINE W REFLEX MICROSCOPIC
Hgb urine dipstick: NEGATIVE
Nitrite: NEGATIVE
Protein, ur: NEGATIVE
Specific Gravity, Urine: 1.03
Urobilinogen, UA: 0.2

## 2011-02-24 LAB — URINE MICROSCOPIC-ADD ON

## 2011-02-24 LAB — CALCIUM: Calcium: 8.3 — ABNORMAL LOW

## 2011-05-02 ENCOUNTER — Other Ambulatory Visit (HOSPITAL_COMMUNITY): Payer: Self-pay | Admitting: Psychiatry

## 2011-05-21 ENCOUNTER — Encounter

## 2011-06-19 ENCOUNTER — Encounter

## 2011-07-20 NOTE — Patient Instructions (Signed)
Well Visit???Ages 18 to 50: After Your Visit  Your Care Instructions  Physical exams can help you stay healthy. Your doctor has checked your overall health and may have suggested ways to take good care of yourself. He or she also may have recommended tests. At home, you can help prevent illness with healthy eating, regular exercise, and other steps.  Follow-up care is a key part of your treatment and safety. Be sure to make and go to all appointments, and call your doctor if you are having problems. It's also a good idea to know your test results and keep a list of the medicines you take.  How can you care for yourself at home?  ?? Reach and stay at a healthy weight. This will lower your risk for many problems, such as obesity, diabetes, heart disease, and high blood pressure.   ?? Get at least 30 minutes of physical activity on most days of the week. Walking is a good choice. You also may want to do other activities, such as running, swimming, cycling, or playing tennis or team sports. Discuss any changes in your exercise program with your doctor.   ?? Do not smoke or allow others to smoke around you. If you need help quitting, talk to your doctor about stop-smoking programs and medicines. These can increase your chances of quitting for good.   ?? Talk to your doctor about whether you have any risk factors for sexually transmitted infections (STIs). Having one sex partner (who does not have STIs and does not have sex with anyone else) is a good way to avoid these infections.   ?? Use birth control if you do not want to have children at this time. Talk with your doctor about the choices available and what might be best for you.   ?? Always wear sunscreen on exposed skin. Make sure the sunscreen blocks ultraviolet rays (both UVA and UVB) and has a sun protection factor (SPF) of at least 15. Use it every day, even when it is cloudy. Some doctors may recommend a higher SPF, such as 30.   ?? See a dentist one or two times a  year for checkups and to have your teeth cleaned.   ?? Wear a seat belt in the car.   ?? Drink alcohol in moderation, if at all. That means no more than 2 drinks a day for men and 1 drink a day for women.   Follow your doctor's advice about when to have certain tests. These tests can spot problems early.  For everyone  ?? Cholesterol. Have the fat (cholesterol) in your blood tested after age 20. Your doctor will tell you how often to have this done based on your age, family history, or other things that can increase your risk for heart disease.   ?? Blood pressure. Experts suggest that healthy adults with normal blood pressure (119/79 mm Hg or below) have their blood pressure checked at least every 1 to 2 years. This can be done during a routine doctor visit. If you have slightly higher or high blood pressure, your doctor will suggest more frequent tests.   ?? Vision. Talk with your doctor about how often to have a glaucoma test.   ?? Diabetes. Ask your doctor whether you should have tests for diabetes.   ?? Colon cancer. Have a test for colon cancer at age 50. You may have one of several tests. If you are younger than 50, you may need a test earlier   if you have any risk factors. Risk factors include whether you already had a precancerous polyp removed from your colon or whether your parent, brother, sister, or child has had colon cancer.   For women  ?? Breast exam and mammogram. Talk to your doctor about when you should have a clinical breast exam and a mammogram. Medical experts differ on whether and how often women under 50 should have these tests. Your doctor can help you decide what is right for you.   ?? Pap test and pelvic exam. Begin Pap tests at age 21. A Pap test is the best way to find cervical cancer. The test often is part of a pelvic exam. Ask how often to have this test.   ?? Tests for sexually transmitted infections (STIs). Ask whether you should have tests for STIs. You may be at risk if you have sex with  more than one person, especially if your partners do not wear condoms.   For men  ?? Tests for sexually transmitted infections (STIs). Ask whether you should have tests for STIs. You may be at risk if you have sex with more than one person, especially if you do not wear a condom.   ?? Testicular cancer exam. Ask your doctor whether you should check your testicles regularly.   ?? Prostate exam. Talk to your doctor about whether you should have a blood test (called a PSA test) for prostate cancer. Experts differ on whether and when men should have this test. Some experts suggest it if you are older than 45 and are African-American or have a father or brother who got prostate cancer when he was younger than 65.   When should you call for help?  Watch closely for changes in your health, and be sure to contact your doctor if you have any problems or symptoms that concern you.    Where can you learn more?    Go to http://www.healthwise.net/[facility]   Enter P072 in the search box to learn more about "Well Visit???Ages 18 to 50: After Your Visit."    ?? 2006-2012 Healthwise, Inc[employer]. Care instructions adapted under license by [employer] (which disclaims liability or warranty for this information). This care instruction is for use with your licensed healthcare professional. If you have questions about a medical condition or this instruction, always ask your healthcare professional. Healthwise, Incorporated disclaims any warranty or liability for your use of this information.  Content Version: 9.5.76532; Last Revised: Sep 19, 2010

## 2011-07-20 NOTE — Progress Notes (Signed)
HISTORY OF PRESENT ILLNESS  Sara Cooke is a 41 y.o. female.  HPI  Pt presents today for routine physical exam for [employer]. Saw dietician recently and is requesting labwork. H  CONCERNS:    PERSONAL MEDICAL HISTORY:  Chronic conditons: gluten intolerance, ADD  Past surgeries: repair ACL right, detached retina  Dietary supplements: none    Family Medical History:  Mother: lymphedema  Father: healthy, recent pneumonia  Siblings: brother healthy    SOCIAL:  Occupation: Charity fundraiser in ambulatory sugery  Marital status: single  Children: none  Smoking status: none   Alcohol intake: occasional  Drug use:: none  Exercise: step twice weekly  Seat belt: always  Dental care: twice yearly  Colonoscopy: last year with normal results    GYN HISTORY:    OB hx:: none  STI hx:: none  Last PAP: 2011 normal  Sexually active: no  Menses: 07/01/11  Breast exam: monthly  Birthcontrol    IMMUNIZATIONS:  FLU: rec'd last fall  Tdap; 1 year ago  Hep B: completed  Gardasil: na    Review of Systems   All other systems reviewed and are negative.        Physical Exam   Nursing note and vitals reviewed.  Constitutional: She is oriented to person, place, and time. She appears well-developed and well-nourished.   HENT:   Head: Normocephalic and atraumatic.   Right Ear: External ear normal.   Left Ear: External ear normal.   Mouth/Throat: Oropharynx is clear and moist.        Teeth in good repair.     Eyes: EOM are normal. Pupils are equal, round, and reactive to light.   Neck: Normal range of motion. Neck supple. No thyromegaly present.   Cardiovascular: Normal rate, regular rhythm and normal heart sounds.    Pulmonary/Chest: Effort normal and breath sounds normal. She has no wheezes. She has no rales.   Abdominal: Soft. Bowel sounds are normal. She exhibits no distension and no mass. There is no tenderness. There is no rebound and no guarding.   Lymphadenopathy:     She has no cervical adenopathy.   Neurological: She is alert and oriented to  person, place, and time. She has normal reflexes.   Skin: Skin is warm and dry.   Psychiatric: She has a normal mood and affect.       ASSESSMENT and PLAN  1. Routine general medical examination at a health care facility  TSH, 3RD GENERATION, CBC WITH AUTOMATED DIFF, METABOLIC PANEL, COMPREHENSIVE, LIPID PANEL, VITAMIN D, 1, 25 DIHYDROXY   Will check labwork,   AVS, diagnosis and treatment plan discussed with pt.  FU with PCP and prn.  States understanding and agrees with POC.

## 2011-07-20 NOTE — Progress Notes (Signed)
Chief Complaint   Patient presents with   ??? Physical     wellness physical, needs blood work

## 2011-07-22 LAB — CBC WITH AUTOMATED DIFF
ABS. BASOPHILS: 0 10*3/uL (ref 0.0–0.2)
ABS. EOSINOPHILS: 0 10*3/uL (ref 0.0–0.4)
ABS. IMM. GRANS.: 0 10*3/uL (ref 0.0–0.1)
ABS. MONOCYTES: 0.5 10*3/uL (ref 0.1–1.0)
ABS. NEUTROPHILS: 4 10*3/uL (ref 1.8–7.8)
Abs Lymphocytes: 1 10*3/uL (ref 0.7–4.5)
BASOPHILS: 0 % (ref 0–3)
EOSINOPHILS: 1 % (ref 0–7)
HCT: 44.4 % (ref 34.0–46.6)
HGB: 15.1 g/dL (ref 11.1–15.9)
IMMATURE GRANULOCYTES: 0 % (ref 0–2)
Lymphocytes: 18 % (ref 14–46)
MCH: 29.2 pg (ref 26.6–33.0)
MCHC: 34 g/dL (ref 31.5–35.7)
MCV: 86 fL (ref 79–97)
MONOCYTES: 8 % (ref 4–13)
NEUTROPHILS: 73 % (ref 40–74)
PLATELET: 180 10*3/uL (ref 140–415)
RBC: 5.18 x10E6/uL (ref 3.77–5.28)
RDW: 12.7 % (ref 12.3–15.4)
WBC: 5.6 10*3/uL (ref 4.0–10.5)

## 2011-07-22 LAB — LIPID PANEL
Cholesterol, total: 163 mg/dL (ref 100–199)
HDL Cholesterol: 66 mg/dL (ref >39)
LDL, calculated: 88 mg/dL (ref 0–99)
Triglyceride: 44 mg/dL (ref 0–149)
VLDL, calculated: 9 mg/dL (ref 5–40)

## 2011-07-22 LAB — METABOLIC PANEL, COMPREHENSIVE
A-G Ratio: 2.1 (ref 1.1–2.5)
ALT (SGPT): 25 IU/L (ref 0–32)
AST (SGOT): 22 IU/L (ref 0–40)
Albumin: 4.7 g/dL (ref 3.5–5.5)
Alk. phosphatase: 67 IU/L (ref 25–150)
BUN/Creatinine ratio: 21 (ref 9–23)
BUN: 15 mg/dL (ref 6–24)
Bilirubin, total: 0.8 mg/dL (ref 0.0–1.2)
CO2: 22 mmol/L (ref 20–32)
Calcium: 9.2 mg/dL (ref 8.7–10.2)
Chloride: 103 mmol/L (ref 97–108)
Creatinine: 0.73 mg/dL (ref 0.57–1.00)
GFR est non-AA: 103 mL/min/{1.73_m2} (ref >59)
GLOBULIN, TOTAL: 2.2 g/dL (ref 1.5–4.5)
Glucose: 85 mg/dL (ref 65–99)
Potassium: 4.6 mmol/L (ref 3.5–5.2)
Protein, total: 6.9 g/dL (ref 6.0–8.5)
Sodium: 139 mmol/L (ref 134–144)
eGFR If African American: 118 mL/min/{1.73_m2} (ref >59)

## 2011-07-22 LAB — CVD REPORT: PDF IMAGE: 0

## 2011-07-22 LAB — TSH 3RD GENERATION: TSH: 1.56 u[IU]/mL (ref 0.450–4.500)

## 2011-07-23 LAB — VITAMIN D, 1, 25 DIHYDROXY: Calcitriol (Vit D 1, 25 di-OH): 61.1 pg/mL (ref 10.0–75.0)

## 2011-07-24 NOTE — Progress Notes (Signed)
Quick Note:    All labs WNL, please inform pt.  ______

## 2011-08-31 ENCOUNTER — Inpatient Hospital Stay: Payer: PRIVATE HEALTH INSURANCE

## 2011-08-31 MED ADMIN — lidocaine (URO-JET) 2 % jelly: TOPICAL | @ 12:00:00 | NDC 76329301205

## 2011-08-31 MED FILL — LIDOCAINE 2 % MUCOUS MEMBRANE JELLY IN APPLICATOR: 2 % | Qty: 10

## 2011-08-31 NOTE — Other (Signed)
5cc viscous lidocaine inhaled into left nare per MD orders.  Probe inserted into  left nare without difficulty.  Pt tolerated procedure well.

## 2011-09-01 NOTE — H&P (Signed)
No h and p  No sedation case

## 2011-09-14 NOTE — Op Note (Signed)
Name:      Sara Cooke, Sara Cooke                                          Surgeon:        Francess Mullen F Chessa Barrasso, MD  Account #: [id number]                 Surgery Date:   08/31/2011  DOB:       08/31/1970  Age:       40                           Location:                                 OPERATIVE REPORT      PREOPERATIVE DIAGNOSIS:  Belching, rule out esophageal motor disorder.    POSTOPERATIVE DIAGNOSES  1. Tiny hiatal hernia.  2. Decreased pressure of lower esophageal sphincter.  3. Abnormal contractions of the esophageal body,   diffuse spasm versus  reflux-related impaired motility.  4. Impaired bolus clearance of 50% of swallows.    PROCEDURES PLANNED  1. Esophageal manometry (91010).  2. Esophageal manometry with impedance (91037).    PROCEDURES PERFORMED:  1. Esophageal manometry (91010).  2. Esophageal manometry with impedance (91037).    ANESTHESIA:  Topical.    ESTIMATED BLOOD LOSS:  Not applicable.    SPECIMENS REMOVED:  None.    DESCRIPTION OF PROCEDURE:  High-resolution esophageal manometry with  impedance was performed by the nursing staff, with subsequent  interpretation by Dr. Sri Clegg. The impedance is facilitated by the use of  Gatorade for the swallows. The lower esophageal sphincter was located  between 41.7 cm and 44.5 cm from the naris. There was Cooke 0.1 cm hiatal  hernia present.    Lower esophageal sphincter pressure is measured in 2 ways. Respiratory  minimum is normal at 5.6 mmHg (4.8 to 32); respiratory mean is low at 10.9  mmHg (13 to 43). Relaxation is normal, with residual pressure 6.6 mmHg  (less than 15 is normal). The upper esophageal sphincter was located at 18  cm. Mean pressure is normal at 98.4 mmHg. Residual pressure is normal at  4.7 mmHg.    Wet swallows were then administered; 40% are peristaltic, 20% are  simultaneous and 40% are failed contractions. Seventeen percent of the  contractions are associated with double-peaked waves (less than 15% is  normal). The amplitude in the distal esophagus at  7 cm above the LES is low  at 33.2 mmHg (37 to 166). The amplitude 3 cm above the LES is normal at  57.5 mmHg (41 to 168).    Qualitatively, there are frequent failed contractions, without significant  secondary contractions for emptying. At least 2 of the contractions are  simultaneous (number 4 and number 2).    With the Gatorade swallows, bolus emptying is only demonstrated at 50%.    In summary, there is weak Cooke or borderline weak lower esophageal sphincter,  evidence of impaired esophageal emptying and evidence of impaired  peristalsis. The question is whether this is Cooke primary motor disorder or Cooke  reflux-related disorder based on the low amplitude in the distal esophagus.  Myopathy is another possibility.        Reviewed   on 09/14/2011 8:57 AM          Aayansh Codispoti F Katreena Schupp, MD    cc:   James R Dageforde, MD        Stephenia Vogan F Delorean Knutzen, MD        RFK/wmx; D: 09/14/2011 06:30 Cooke; T: 09/14/2011 08:51 Cooke; Doc# [id number]; Job#  [id number]

## 2011-09-14 NOTE — Op Note (Signed)
Name:      Andrey [facility]                                          Surgeon:        Gwenette Greet, MD  Account #: [id number]                 Surgery Date:   08/31/2011  DOB:       [birth year]  Age:       41                           Location:                                 OPERATIVE REPORT      PREOPERATIVE DIAGNOSIS:  Belching, rule out esophageal motor disorder.    POSTOPERATIVE DIAGNOSES  1. Tiny hiatal hernia.  2. Decreased pressure of lower esophageal sphincter.  3. Abnormal contractions of the esophageal body,   diffuse spasm versus  reflux-related impaired motility.  4. Impaired bolus clearance of 50% of swallows.    PROCEDURES PLANNED  1. Esophageal manometry (91010).  2. Esophageal manometry with impedance (16109).    PROCEDURES PERFORMED:  1. Esophageal manometry (91010).  2. Esophageal manometry with impedance (60454).    ANESTHESIA:  Topical.    ESTIMATED BLOOD LOSS:  Not applicable.    SPECIMENS REMOVED:  None.    DESCRIPTION OF PROCEDURE:  High-resolution esophageal manometry with  impedance was performed by the nursing staff, with subsequent  interpretation by Dr. Boyce Medici. The impedance is facilitated by the use of  Gatorade for the swallows. The lower esophageal sphincter was located  between 41.7 cm and 44.5 cm from the naris. There was a 0.1 cm hiatal  hernia present.    Lower esophageal sphincter pressure is measured in 2 ways. Respiratory  minimum is normal at 5.6 mmHg (4.8 to 32); respiratory mean is low at 10.9  mmHg (13 to 43). Relaxation is normal, with residual pressure 6.6 mmHg  (less than 15 is normal). The upper esophageal sphincter was located at 18  cm. Mean pressure is normal at 98.4 mmHg. Residual pressure is normal at  4.7 mmHg.    Wet swallows were then administered; 40% are peristaltic, 20% are  simultaneous and 40% are failed contractions. Seventeen percent of the  contractions are associated with double-peaked waves (less than 15% is  normal). The amplitude in the distal esophagus at  7 cm above the LES is low  at 33.2 mmHg (37 to 166). The amplitude 3 cm above the LES is normal at  57.5 mmHg (41 to 168).    Qualitatively, there are frequent failed contractions, without significant  secondary contractions for emptying. At least 2 of the contractions are  simultaneous (number 4 and number 2).    With the Gatorade swallows, bolus emptying is only demonstrated at 50%.    In summary, there is weak a or borderline weak lower esophageal sphincter,  evidence of impaired esophageal emptying and evidence of impaired  peristalsis. The question is whether this is a primary motor disorder or a  reflux-related disorder based on the low amplitude in the distal esophagus.  Myopathy is another possibility.        Reviewed  on 09/14/2011 8:57 AM          Gwenette Greet, MD    cc:   Moshe Cipro, MD        Gwenette Greet, MD        RFK/wmx; D: 09/14/2011 06:30 A; T: 09/14/2011 08:51 A; Doc# [id number]; Job#  [id number]

## 2011-09-18 ENCOUNTER — Encounter

## 2011-12-24 ENCOUNTER — Inpatient Hospital Stay: Payer: PRIVATE HEALTH INSURANCE

## 2011-12-24 LAB — HCG URINE, QL. - POC: Pregnancy test,urine (POC): NEGATIVE

## 2011-12-24 MED ADMIN — propofol (DIPRIVAN) 10 mg/mL injection 10-400 mg: INTRAVENOUS | @ 19:00:00 | NDC 63323026929

## 2011-12-24 MED ADMIN — 0.9% sodium chloride infusion: INTRAVENOUS | @ 17:00:00 | NDC 00409798309

## 2011-12-24 MED FILL — SODIUM CHLORIDE 0.9 % IV: INTRAVENOUS | Qty: 1000

## 2011-12-24 MED FILL — HURRICAINE 20 % MUCOSAL SPRAY: 20 % | Qty: 57

## 2011-12-24 MED FILL — FENTANYL CITRATE (PF) 50 MCG/ML IJ SOLN: 50 mcg/mL | INTRAMUSCULAR | Qty: 2

## 2011-12-24 MED FILL — ONDANSETRON (PF) 4 MG/2 ML INJECTION: 4 mg/2 mL | INTRAMUSCULAR | Qty: 2

## 2011-12-24 NOTE — Other (Signed)
Sara Cooke  Sep 21, 1970  [id number]    Situation:  Verbal report received from: J. Talmadge Coventry RN  Procedure: Procedure(s) with comments:  ESOPHAGOGASTRODUODENOSCOPY (EGD) - EGD with dil  ESOPHAGEAL DILATION    Background:    Preoperative diagnosis: DYSKINESIA     Postoperative diagnosis: esophageal motor disorder    Operator:  Dr. Gwenette Greet, MD    Assistant(s): Denice M Dorton - Endoscopy Technician-1  Marvell Fuller, RN - Endoscopy RN-1  Donneta Romberg, RN - Endoscopy RN-2    Specimens: * No specimens in log *  H. Plyori  no    Assessment:  Intra-procedure medications   Versed 0 mg  Fentanyl 100 mcg    Intravenous fluids: NS@ KVO     Vital signs stable yes    Abdominal assessment: round and soft yes    Recommendation:  Discharge patient per MD order yes.  Family or Friend yes/parents  Permission to share finding with family or friend yes

## 2011-12-24 NOTE — H&P (Signed)
Pre-endoscopy H and P    The patient was seen and examined in the endoscopy suite.  The airway was assessed and docuemented.  The problem list, past medical history, and medications were reviewed.     Patient Active Problem List   Diagnoses Code   ??? Dysphagia 787.20   ??? 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 Marital Status: SINGLE     Spouse Name: N/A     Number of Children: N/A   ??? Years of Education: N/A     Occupational History   ??? Not on file.     Social History Main Topics   ??? Smoking status: Never Smoker    ??? Smokeless tobacco: Never Used   ??? Alcohol Use: No      socially   ??? Drug Use:    ??? Sexually Active:      Other Topics Concern   ??? Not on file     Social History Narrative    ** Merged History Encounter **      Past Medical History   Diagnosis Date   ??? Anal fissure    ??? Torn ACL    ??? Thyroid disease    ??? Detached retina    ??? Eagle's syndrome    ??? Hx: UTI (urinary tract infection)    ??? Bronchitis    ??? Otitis media    ??? Ovarian cyst    ??? Anemia NEC      ideopathic   ??? Gluten intolerance    ??? IBS (irritable bowel syndrome)    ??? Costochondritis    ??? Other ill-defined conditions      ibs   ??? Other ill-defined conditions      torn acl   ??? Other ill-defined conditions      detached right retina - self healed   ??? Nausea & vomiting    ??? Asthma    ??? GERD (gastroesophageal reflux disease)    ??? Dysphagia 12/24/2011   ??? Esophageal motor disorder 12/24/2011     The patient has a family history of naq    Prior to Admission Medications   Medication Last Dose Informant Patient Reported? Taking?   methylphenidate (RITALIN) 5 mg tablet 11/23/2011 at Unknown  Yes Yes   Take 2.5 mg by mouth as directed. Indications: ATTENTION-DEFICIT HYPERACTIVITY DISORDER   chlorthalidone (HYGROTEN) 25 mg tablet 11/23/2011 at Unknown  Yes Yes   Take  by mouth daily.     potassium chloride (K-DUR) 20 mEq tablet 11/23/2011 at Unknown  Yes Yes   Take  by mouth two (2) times a day.     benzonatate (TESSALON PERLES) 100 mg  capsule 12/24/2011 at Unknown  Yes Yes   Take 100 mg by mouth three (3) times daily as needed.     NAPROXEN SODIUM (ALEVE PO) 12/23/2011 at Unknown  Yes Yes   Take  by mouth.     Cetirizine (ZYRTEC) 10 mg Cap 12/23/2011 at Unknown  Yes Yes   Take  by mouth.     ALBUTEROL IN 11/23/2011 at Unknown  Yes Yes   Take  by inhalation.     ASCORBATE CALCIUM (VITAMIN C PO) 12/23/2011 at Unknown  Yes Yes   Take  by mouth.     Azelastine (ASTEPRO) 0.15 % (205.5 mcg) nasal spray 12/24/2011 at Unknown  Yes Yes   1 Spray by Nasal route two (2) times a day. Administer to  right and left nostril.    CALCIUM CARB/MAGNESIUM CMB #10 (CAL-MAG PO) 12/23/2011 at Unknown  Yes Yes   Take 2 Tabs by mouth daily.   ERGOCALCIFEROL, VITAMIN D2, (VITAMIN D PO) 11/23/2011 at Unknown  Yes Yes   Take 2,000 Units by mouth daily.   LACTOBACILLUS RHAMNOSUS GG (PROBIOTIC PO) 11/23/2011 at Unknown  Yes Yes   Take 2 Tabs by mouth two (2) times a day.   levothyroxine (LEVOXYL) 88 mcg tablet 12/24/2011 at Unknown Self Yes Yes   Take 88 mcg by mouth daily (before breakfast).   OTHER,NON-FORMULARY, 11/23/2011 at Unknown  Yes Yes   Take 1 Tab by mouth three (3) times daily (with meals). Indications: Med. - Digest   pseudoephedrine-guaiFENesin (MUCINEX D) 60-600 mg per tablet Unknown at Unknown  Yes No   Take 1 Tab by mouth every twelve (12) hours.     ergocalciferol (VITAMIN D) 50,000 unit capsule   Yes No   Take 50,000 Units by mouth.     montelukast (SINGULAIR) 10 mg tablet 12/22/2011  Yes No   Take 10 mg by mouth daily.     DM/P-EPHED/ACETAMINOPH/DOXYLAM (NYQUIL PO) Unknown at Unknown  Yes No   Take  by mouth.     montelukast (SINGULAIR) 10 mg tablet   Yes No   Take 10 mg by mouth daily.   bumetanide (BUMEX) 0.5 mg tablet Not Taking at Unknown  Yes No   Take 0.5 mg by mouth daily.   CINNAMON BARK (CINNAMON PO) Not Taking at Unknown  Yes No   Take 2 Tabs by mouth two (2) times a day.   sibutramine (MERIDIA) 10 mg capsule Not Taking at Unknown  Yes No   Take 10 mg by mouth daily.               The review of systems is:  negative for shortness of breath or chest pain      The heart, lungs, and mental status were satisfactory for the administration of anesthesia sedation and for the procedure.      I discussed with the patient the objectives, risks, consequences and alternatives to the procedure.      Gwenette Greet, MD  12/24/2011  2:41 PM

## 2011-12-24 NOTE — Procedures (Signed)
Esophagogastroduodenoscopy    Indications:  See above    Medications:  See anesthesia form    Post procedure diagnosis:  Esophageal motor disorder  Description of Procedure:    Prior to the procedure its objectives, risks, consequences and alternatives were discussed with the patient who then elected to proceed.  The Olympus video endoscope was inserted under direct vision into the mouth and then into the esophagus.  The esophagus looked normal.    The z-line was located at 39cm.  There were no diagnostic abnormalities of the body, fundus, antrum, cardia and incisura of the stomach.  This included direct and retroflexion examination.  The first and second portion of the duodenum appeared normal.  I placed a spring tipped guide wire in the lumen of the duodenum and removed the scope.  I then passed an [employer] dilator size 48 Fr over the wire.  I passed the wire to about 45 cm without resistance and without heme.          Complications: There were no apparent complications and the patient tolerated the procedure well.        Estimated Blood Loss:  none  Specimens Removed:  none  Impressions:  Successful dilation  Negative visual exam      Signed By: Ekam Bonebrake F Sheron Robin, MD                        December 24, 2011     2:50 PM

## 2011-12-24 NOTE — Other (Addendum)
Resumes care at 1454 from anesth.  Glasses retuned to pt  Pt given fentanyl and propofol 80mg  iv per anesth.

## 2011-12-24 NOTE — Procedures (Signed)
Esophagogastroduodenoscopy    Indications:  See above    Medications:  See anesthesia form    Post procedure diagnosis:  Esophageal motor disorder  Description of Procedure:    Prior to the procedure its objectives, risks, consequences and alternatives were discussed with the patient who then elected to proceed.  The Olympus video endoscope was inserted under direct vision into the mouth and then into the esophagus.  The esophagus looked normal.    The z-line was located at 39cm.  There were no diagnostic abnormalities of the body, fundus, antrum, cardia and incisura of the stomach.  This included direct and retroflexion examination.  The first and second portion of the duodenum appeared normal.  I placed a spring tipped guide wire in the lumen of the duodenum and removed the scope.  I then passed an [employer] dilator size 48 Fr over the wire.  I passed the wire to about 45 cm without resistance and without heme.          Complications: There were no apparent complications and the patient tolerated the procedure well.        Estimated Blood Loss:  none  Specimens Removed:  none  Impressions:  Successful dilation  Negative visual exam      Signed By: Gwenette Greet, MD                        December 24, 2011     2:50 PM

## 2011-12-29 MED FILL — LIDOCAINE HCL 2 % (20 MG/ML) IJ SOLN: 20 mg/mL (2 %) | INTRAMUSCULAR | Qty: 20

## 2012-01-08 ENCOUNTER — Encounter

## 2012-01-15 MED ADMIN — ioversol (OPTIRAY) 320 mg iodine/mL contrast injection 100 mL: INTRAVENOUS | @ 12:00:00 | NDC 00019132311

## 2012-01-15 MED ADMIN — sodium chloride (NS) flush 10 mL: INTRAVENOUS | @ 12:00:00 | NDC 87701099893

## 2012-01-15 MED ADMIN — sodium chloride 0.9 % bolus infusion 100 mL: INTRAVENOUS | @ 12:00:00 | NDC 00409798437

## 2012-01-15 MED FILL — OPTIRAY 320 MG IODINE/ML INTRAVENOUS SOLUTION: 320 mg iodine/mL | INTRAVENOUS | Qty: 100

## 2012-01-15 MED FILL — SODIUM CHLORIDE 0.9 % IV: INTRAVENOUS | Qty: 100

## 2012-01-15 MED FILL — BD POSIFLUSH NORMAL SALINE 0.9 % INJECTION SYRINGE: INTRAMUSCULAR | Qty: 10

## 2012-06-23 ENCOUNTER — Other Ambulatory Visit (HOSPITAL_COMMUNITY): Payer: Self-pay | Admitting: Nurse Practitioner

## 2012-06-23 DIAGNOSIS — Z139 Encounter for screening, unspecified: Secondary | ICD-10-CM

## 2012-06-27 ENCOUNTER — Ambulatory Visit (HOSPITAL_COMMUNITY)
Admission: RE | Admit: 2012-06-27 | Discharge: 2012-06-27 | Disposition: A | Discharge status: Home / Self Care | Payer: Self-pay | Source: Ambulatory Visit | Attending: Nurse Practitioner | Admitting: Nurse Practitioner

## 2012-06-27 DIAGNOSIS — Z139 Encounter for screening, unspecified: Secondary | ICD-10-CM

## 2012-06-28 ENCOUNTER — Other Ambulatory Visit (HOSPITAL_COMMUNITY)
Admission: RE | Admit: 2012-06-28 | Discharge: 2012-06-28 | Disposition: A | Discharge status: Home / Self Care | Payer: Self-pay | Source: Ambulatory Visit | Attending: Unknown Physician Specialty | Admitting: Unknown Physician Specialty

## 2012-06-28 DIAGNOSIS — R87610 Atypical squamous cells of undetermined significance on cytologic smear of cervix (ASC-US): Secondary | ICD-10-CM | POA: Insufficient documentation

## 2012-06-28 DIAGNOSIS — R87619 Unspecified abnormal cytological findings in specimens from cervix uteri: Secondary | ICD-10-CM | POA: Insufficient documentation

## 2012-10-18 ENCOUNTER — Encounter

## 2012-12-04 DIAGNOSIS — R21 Rash and other nonspecific skin eruption: Secondary | ICD-10-CM | POA: Insufficient documentation

## 2013-02-14 ENCOUNTER — Encounter

## 2013-05-26 NOTE — Other (Signed)
Patient is a 43 y.o. female presenting with sinus pain. The history is provided by the patient.   Sinus Pain   This is a chronic problem. The current episode started more than 1 week ago. The problem has been gradually worsening. There has been no fever. Associated symptoms include congestion, ear pain (left side), sinus pressure, cough, shortness of breath and headaches. She has tried acetaminophen, decongestants and nasal steroids for the symptoms. The treatment provided moderate relief.        Past Medical History   Diagnosis Date   ??? Anal fissure    ??? Torn ACL    ??? Thyroid disease    ??? Detached retina    ??? Eagle's syndrome    ??? Hx: UTI (urinary tract infection)    ??? Bronchitis    ??? Otitis media    ??? Ovarian cyst    ??? Anemia NEC      ideopathic   ??? Gluten intolerance    ??? IBS (irritable bowel syndrome)    ??? Costochondritis    ??? Other ill-defined conditions      ibs   ??? Other ill-defined conditions      torn acl   ??? Other ill-defined conditions      detached right retina - self healed   ??? Nausea & vomiting    ??? Asthma    ??? GERD (gastroesophageal reflux disease)    ??? Dysphagia 12/24/2011   ??? 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 Hx other surgical       wisdom teeth,gums   ??? Pr colonoscopy,biopsy  01/07/2009         ??? Pr upper gi endoscopy,biopsy  01/07/2009         ??? Hx gyn     ??? Pr cardiac surg procedure unlist           Family History   Problem Relation Age of Onset   ??? Other Mother    ??? Anxiety Mother         History     Social History   ??? Marital Status: SINGLE     Spouse Name: N/A     Number of Children: N/A   ??? Years of Education: N/A     Occupational History   ??? Not on file.     Social History Main Topics   ??? Smoking status: Never Smoker    ??? Smokeless tobacco: Never Used   ??? Alcohol Use: No      Comment: socially   ??? Drug Use:    ??? Sexually Active:      Other Topics Concern   ??? Not on file     Social History Narrative    ** Merged History Encounter **                      ALLERGIES: Percocet; Percocet; Wellbutrin; and Wellbutrin    Filed Vitals:    05/26/13 1217   BP: 111/73   Pulse: 76   Temp: 97.5 ??F (36.4 ??C)   Resp: 18   Height: 5\' 5"  (1.651 m)   Weight: 74.662 kg (164 lb 9.6 oz)   SpO2: 98%       Physical Exam   Nursing note and vitals reviewed.  Constitutional: No distress.   HENT:   Right Ear: Tympanic membrane and ear canal normal.   Left Ear: Ear  canal normal. Tympanic membrane is not injected and not retracted. A middle ear effusion is present.   Nose: Mucosal edema present. Right sinus exhibits maxillary sinus tenderness and frontal sinus tenderness. Left sinus exhibits maxillary sinus tenderness and frontal sinus tenderness.   Mouth/Throat: Uvula is midline, oropharynx is clear and moist and mucous membranes are normal. No oropharyngeal exudate, posterior oropharyngeal edema or posterior oropharyngeal erythema.   Eyes: Conjunctivae are normal. Right eye exhibits no discharge. Left eye exhibits no discharge.   Neck: Neck supple.   Pulmonary/Chest: Effort normal and breath sounds normal. No respiratory distress. She has no wheezes. She has no rales.   Lymphadenopathy:     She has no cervical adenopathy.   Skin: No rash noted.       MDM    Procedures

## 2013-05-28 MED ADMIN — albuterol-ipratropium (DUO-NEB) 2.5 MG-0.5 MG/3 ML: RESPIRATORY_TRACT | @ 22:00:00 | NDC 00093672373

## 2013-05-28 NOTE — Other (Signed)
HPI Comments: Hx of Asthma, presents for follow-up. Was diagnosed with bronchitis and sinusitis 2 days ago, taking antibiotics. Noted more shortness of breath yesterday and today. Using albuterol inhaler 1-2 times daily.    Patient is a 43 y.o. female presenting with follow-up.   Follow-up         Past Medical History   Diagnosis Date   ??? Anal fissure    ??? Torn ACL    ??? Thyroid disease    ??? Detached retina    ??? Eagle's syndrome    ??? Hx: UTI (urinary tract infection)    ??? Bronchitis    ??? Otitis media    ??? Ovarian cyst    ??? Anemia NEC      ideopathic   ??? Gluten intolerance    ??? IBS (irritable bowel syndrome)    ??? Costochondritis    ??? Other ill-defined conditions      ibs   ??? Other ill-defined conditions      torn acl   ??? Other ill-defined conditions      detached right retina - self healed   ??? Nausea & vomiting    ??? Asthma    ??? GERD (gastroesophageal reflux disease)    ??? Dysphagia 12/24/2011   ??? 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 Hx other surgical       wisdom teeth,gums   ??? Pr colonoscopy,biopsy  01/07/2009         ??? Pr upper gi endoscopy,biopsy  01/07/2009         ??? Hx gyn     ??? Pr cardiac surg procedure unlist           Family History   Problem Relation Age of Onset   ??? Other Mother    ??? Anxiety Mother         History     Social History   ??? Marital Status: SINGLE     Spouse Name: N/A     Number of Children: N/A   ??? Years of Education: N/A     Occupational History   ??? Not on file.     Social History Main Topics   ??? Smoking status: Never Smoker    ??? Smokeless tobacco: Never Used   ??? Alcohol Use: No      Comment: socially   ??? Drug Use:    ??? Sexually Active:      Other Topics Concern   ??? Not on file     Social History Narrative    ** Merged History Encounter **                     ALLERGIES: Percocet; Percocet; Wellbutrin; and Wellbutrin    Filed Vitals:    05/28/13 1558   BP: 122/67   Pulse: 90   Temp: 98.2 ??F (36.8 ??C)   Resp: 16   Height: 5\' 5"  (1.651 m)    Weight: 73.755 kg (162 lb 9.6 oz)   SpO2: 99%       Physical Exam   Nursing note and vitals reviewed.  Constitutional: She is oriented to person, place, and time. She appears well-developed and well-nourished.   HENT:   Head: Normocephalic and atraumatic.   Right Ear: Tympanic membrane and external ear normal.   Left Ear: Tympanic membrane and external ear normal.   Nose: Mucosal edema and sinus tenderness present.  No rhinorrhea or nose lacerations.   Mouth/Throat: Oropharynx is clear and moist and mucous membranes are normal. Normal dentition. No oropharyngeal exudate, posterior oropharyngeal edema or posterior oropharyngeal erythema.   Eyes: Conjunctivae and EOM are normal. Pupils are equal, round, and reactive to light. Right eye exhibits no discharge. Left eye exhibits no discharge. No scleral icterus.   Neck: Normal range of motion. No tracheal deviation present. No thyromegaly present.   Cardiovascular: Normal rate, regular rhythm and normal heart sounds.    No murmur heard.  Pulmonary/Chest: Effort normal. No respiratory distress. She has decreased breath sounds. She has no wheezes. She has no rhonchi. She has no rales. She exhibits no tenderness.   Abdominal: Soft. Bowel sounds are normal. She exhibits no distension. There is no tenderness. There is no rebound and no guarding.   Musculoskeletal: Normal range of motion. She exhibits no edema and no tenderness.   Lymphadenopathy:     She has no cervical adenopathy.   Neurological: She is alert and oriented to person, place, and time. No cranial nerve deficit. Coordination normal.   Skin: Skin is warm. No erythema.   Psychiatric: She has a normal mood and affect. Her behavior is normal. Judgment and thought content normal.       MDM    Procedures

## 2013-08-22 ENCOUNTER — Encounter: Payer: Self-pay | Admitting: Gastroenterology

## 2013-10-11 ENCOUNTER — Ambulatory Visit: Payer: Self-pay | Admitting: Gastroenterology

## 2013-12-12 ENCOUNTER — Ambulatory Visit: Payer: Self-pay | Admitting: Gastroenterology

## 2014-02-19 ENCOUNTER — Encounter

## 2014-02-20 ENCOUNTER — Encounter

## 2014-03-01 ENCOUNTER — Inpatient Hospital Stay: Admit: 2014-03-01 | Payer: PRIVATE HEALTH INSURANCE | Attending: Otolaryngology | Primary: Family Medicine

## 2014-03-01 DIAGNOSIS — J342 Deviated nasal septum: Secondary | ICD-10-CM

## 2014-05-22 ENCOUNTER — Encounter

## 2014-05-30 ENCOUNTER — Ambulatory Visit: Payer: PRIVATE HEALTH INSURANCE | Primary: Family Medicine

## 2014-06-07 ENCOUNTER — Inpatient Hospital Stay: Payer: PRIVATE HEALTH INSURANCE | Attending: Neurology | Primary: Family Medicine

## 2014-06-07 ENCOUNTER — Encounter

## 2014-06-07 DIAGNOSIS — G43809 Other migraine, not intractable, without status migrainosus: Secondary | ICD-10-CM

## 2014-06-21 ENCOUNTER — Inpatient Hospital Stay: Admit: 2014-06-21 | Payer: PRIVATE HEALTH INSURANCE | Attending: Neurology | Primary: Family Medicine

## 2014-06-21 DIAGNOSIS — M5022 Other cervical disc displacement, mid-cervical region: Secondary | ICD-10-CM

## 2014-06-29 ENCOUNTER — Inpatient Hospital Stay
Admit: 2014-06-29 | Discharge: 2014-06-29 | Disposition: A | Discharge status: Home / Self Care | Payer: PRIVATE HEALTH INSURANCE | Attending: Family Medicine

## 2014-06-29 DIAGNOSIS — S39012A Strain of muscle, fascia and tendon of lower back, initial encounter: Secondary | ICD-10-CM
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
## 2014-07-25 ENCOUNTER — Encounter

## 2014-07-27 ENCOUNTER — Inpatient Hospital Stay: Admit: 2014-07-27 | Payer: PRIVATE HEALTH INSURANCE | Attending: Family Medicine | Primary: Family Medicine

## 2014-07-27 DIAGNOSIS — Z1231 Encounter for screening mammogram for malignant neoplasm of breast: Secondary | ICD-10-CM

## 2014-09-07 ENCOUNTER — Inpatient Hospital Stay
Admit: 2014-09-07 | Discharge: 2014-09-07 | Disposition: A | Discharge status: Home / Self Care | Payer: PRIVATE HEALTH INSURANCE | Attending: Family Medicine

## 2014-09-07 DIAGNOSIS — R059 Cough, unspecified: Secondary | ICD-10-CM

## 2014-09-07 LAB — POC CHEM8
Anion gap (POC): 20 mmol/L — ABNORMAL HIGH (ref 5–15)
BUN (POC): 14 MG/DL (ref 9–20)
CO2 (POC): 21 MMOL/L (ref 21–32)
Calcium, ionized (POC): 1.18 MMOL/L (ref 1.12–1.32)
Chloride (POC): 102 MMOL/L (ref 98–107)
Creatinine (POC): 0.6 MG/DL (ref 0.6–1.3)
GFRAA, POC: >60 mL/min/{1.73_m2} (ref >60)
GFRNA, POC: >60 mL/min/{1.73_m2} (ref >60)
Glucose (POC): 104 MG/DL (ref 65–105)
Hematocrit (POC): 41 % (ref 35.0–47.0)
Hemoglobin (POC): 13.9 GM/DL (ref 11.5–16.0)
Potassium (POC): 3.8 MMOL/L (ref 3.5–5.1)
Sodium (POC): 138 MMOL/L (ref 136–145)

## 2014-09-07 MED ORDER — AMOXICILLIN CLAVULANATE 875 MG-125 MG TAB
875-125 mg | ORAL_TABLET | Freq: Two times a day (BID) | ORAL | Status: AC
Start: 2014-09-07 — End: 2014-09-17

## 2014-09-07 MED ORDER — ONDANSETRON (PF) 4 MG/2 ML INJECTION
4 mg/2 mL | Freq: Once | INTRAMUSCULAR | Status: AC
Start: 2014-09-07 — End: 2014-09-07
  Administered 2014-09-07: 22:00:00 via INTRAVENOUS

## 2014-09-07 MED ORDER — ONDANSETRON 4 MG TAB, RAPID DISSOLVE
4 mg | ORAL_TABLET | Freq: Three times a day (TID) | ORAL | Status: DC | PRN
Start: 2014-09-07 — End: 2018-03-14

## 2014-09-07 MED ORDER — SODIUM CHLORIDE 0.9% BOLUS IV
0.9 % | Freq: Once | INTRAVENOUS | Status: AC
Start: 2014-09-07 — End: 2014-09-07
  Administered 2014-09-07: 22:00:00 via INTRAVENOUS

## 2014-09-07 NOTE — Other (Signed)
HPI Comments: Patient reports she was seen at [facility] yesterday and was tested negative for Influenza. She states she was prescribed Avelox and tussionex and diagnosed with a sinus infection.     Patient is a 44 y.o. female presenting with cough. The history is provided by the patient.   Cough  This is a new problem. Episode onset: 1 week ago. The problem occurs constantly. The problem has not changed since onset.The cough is non-productive. There has been no fever. Associated symptoms include chills, headaches, nausea and vomiting (6 episodes of vomiting today. no blood in emesis.). Pertinent negatives include no chest pain, no sweats, no weight loss, no eye redness, no ear congestion, no ear pain, no rhinorrhea, no sore throat, no myalgias, no shortness of breath, no wheezing and no confusion. She has tried prescription drugs (avelox, tussionex, and aleve) for the symptoms. The treatment provided no relief. She is not a smoker.        Past Medical History   Diagnosis Date   ??? Anal fissure    ??? Torn ACL    ??? Thyroid disease    ??? Detached retina    ??? Eagle's syndrome    ??? Hx: UTI (urinary tract infection)    ??? Bronchitis    ??? Otitis media    ??? Ovarian cyst    ??? Anemia NEC      ideopathic   ??? Gluten intolerance    ??? IBS (irritable bowel syndrome)    ??? Costochondritis    ??? Other ill-defined conditions(799.89)      ibs   ??? Other ill-defined conditions(799.89)      torn acl   ??? Other ill-defined conditions(799.89)      detached right retina - self healed   ??? Nausea & vomiting    ??? Asthma    ??? GERD (gastroesophageal reflux disease)    ??? Dysphagia 12/24/2011   ??? 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 Hx other surgical       wisdom teeth,gums   ??? Pr colonoscopy,biopsy  01/07/2009         ??? Pr upper gi endoscopy,biopsy  01/07/2009         ??? Hx gyn     ??? Pr cardiac surg procedure unlist           Family History   Problem Relation Age of Onset   ??? Other Mother     ??? Anxiety Mother         History     Social History   ??? Marital Status: SINGLE     Spouse Name: N/A   ??? Number of Children: N/A   ??? Years of Education: N/A     Occupational History   ??? Not on file.     Social History Main Topics   ??? Smoking status: Never Smoker    ??? Smokeless tobacco: Never Used   ??? Alcohol Use: No      Comment: socially   ??? Drug Use: Not on file   ??? Sexual Activity: Not on file     Other Topics Concern   ??? Not on file     Social History Narrative    ** Merged History Encounter **                     ALLERGIES: Percocet; Percocet; Wellbutrin; and Wellbutrin    Review  of Systems   Constitutional: Positive for chills. Negative for fever, weight loss, diaphoresis and fatigue.   HENT: Positive for postnasal drip. Negative for ear pain, rhinorrhea and sore throat.    Eyes: Negative for redness.   Respiratory: Positive for cough. Negative for apnea, choking, chest tightness, shortness of breath, wheezing and stridor.    Cardiovascular: Negative for chest pain and palpitations.   Gastrointestinal: Positive for nausea and vomiting (6 episodes of vomiting today. no blood in emesis.). Negative for abdominal pain, diarrhea, constipation, blood in stool, abdominal distention, anal bleeding and rectal pain.   Musculoskeletal: Negative for myalgias.   Skin: Negative for rash.   Neurological: Positive for headaches. Negative for dizziness, weakness and light-headedness.   Psychiatric/Behavioral: Negative for confusion.       Filed Vitals:    09/07/14 1647 09/07/14 1817   BP: 158/79 126/88   Pulse: 74    Temp: 97.6 ??F (36.4 ??C)    Resp: 20    Height: 5\' 6"  (1.676 m)    Weight: 75.099 kg (165 lb 9 oz)    SpO2: 99%        Physical Exam   Constitutional: She is oriented to person, place, and time. She appears well-developed and well-nourished. She is active.  Non-toxic appearance. She does not have a sickly appearance. She does not appear ill. No distress. She is not intubated.   HENT:    Head: Normocephalic and atraumatic.   Right Ear: Hearing, tympanic membrane, external ear and ear canal normal.   Left Ear: Hearing, tympanic membrane, external ear and ear canal normal.   Nose: Mucosal edema present. No rhinorrhea, sinus tenderness, nasal deformity, septal deviation or nasal septal hematoma. No epistaxis.  No foreign bodies. Right sinus exhibits maxillary sinus tenderness and frontal sinus tenderness. Left sinus exhibits maxillary sinus tenderness and frontal sinus tenderness.   Mouth/Throat: Uvula is midline and oropharynx is clear and moist. Mucous membranes are not pale, dry and not cyanotic. No oral lesions. No trismus in the jaw. No uvula swelling or lacerations. No oropharyngeal exudate, posterior oropharyngeal edema, posterior oropharyngeal erythema or tonsillar abscesses.   Cardiovascular: S1 normal, S2 normal and normal heart sounds.  PMI is not displaced.  Exam reveals no gallop, no distant heart sounds and no friction rub.    No murmur heard.  Pulmonary/Chest: Effort normal and breath sounds normal. No accessory muscle usage. No apnea and no bradypnea. She is not intubated. No respiratory distress. She has no decreased breath sounds. She has no wheezes. She has no rhonchi. She has no rales. She exhibits no tenderness.   Lymphadenopathy:        Head (right side): No submental, no submandibular, no tonsillar, no preauricular, no posterior auricular and no occipital adenopathy present.        Head (left side): No submental, no submandibular, no tonsillar, no preauricular, no posterior auricular and no occipital adenopathy present.     She has no cervical adenopathy.   Neurological: She is alert and oriented to person, place, and time. She is not disoriented. Gait normal. GCS eye subscore is 4. GCS verbal subscore is 5. GCS motor subscore is 6.   Skin: Skin is warm, dry and intact. No abrasion, no bruising, no burn, no ecchymosis, no laceration, no lesion, no petechiae and no rash noted. She  is not diaphoretic. No cyanosis or erythema. No pallor. Nails show no clubbing.   Psychiatric: She has a normal mood and affect. Her speech is normal and  behavior is normal. Judgment and thought content normal.   Vitals reviewed.      MDM     Differential Diagnosis; Clinical Impression; Plan:     CLINICAL IMPRESSION:  Cough  (primary encounter diagnosis)  Nasal congestion  Nausea and vomiting, unspecified intactability, vomiting of unspecified type    POC Chem 8 - see results   Zofran now  NaCl 0.9% IV bolus     Plan:   Augmentin  Zofran ODT. Instructed patient not to take Zofran until tomorrow.   Fluids   Discontinue Avelox. Start taking Augmentin.   Ibuprofen for sinus pressure/pain   Over the Counter Nasal Decongestant i.e. Flonase Nasal Spray or Oral Nasal Decongestant  Emergency Department for worsening symptoms.   Primary Care Provider for follow up.   Plan discussed and patient is in agreement.         Amount and/or Complexity of Data Reviewed:   Clinical lab tests:  Ordered and reviewed   Review and summarize past medical records:  Yes   Discuss the patient with another provider:  Yes  Risk of Significant Complications, Morbidity, and/or Mortality:   Presenting problems:  Low  Diagnostic procedures:  Low  Management options:  Low  Progress:   Patient progress:  Stable and improved      Procedures

## 2014-12-24 ENCOUNTER — Emergency Department (HOSPITAL_COMMUNITY)
Admission: EM | Admit: 2014-12-24 | Discharge: 2014-12-24 | Disposition: A | Discharge status: Home / Self Care | Payer: Self-pay | Attending: Emergency Medicine | Admitting: Emergency Medicine

## 2014-12-24 ENCOUNTER — Encounter (HOSPITAL_COMMUNITY): Payer: Self-pay | Admitting: Emergency Medicine

## 2014-12-24 ENCOUNTER — Emergency Department (HOSPITAL_COMMUNITY): Payer: Self-pay

## 2014-12-24 DIAGNOSIS — Z87891 Personal history of nicotine dependence: Secondary | ICD-10-CM | POA: Insufficient documentation

## 2014-12-24 DIAGNOSIS — Y9389 Activity, other specified: Secondary | ICD-10-CM | POA: Insufficient documentation

## 2014-12-24 DIAGNOSIS — Y9289 Other specified places as the place of occurrence of the external cause: Secondary | ICD-10-CM | POA: Insufficient documentation

## 2014-12-24 DIAGNOSIS — J45909 Unspecified asthma, uncomplicated: Secondary | ICD-10-CM | POA: Insufficient documentation

## 2014-12-24 DIAGNOSIS — E785 Hyperlipidemia, unspecified: Secondary | ICD-10-CM | POA: Insufficient documentation

## 2014-12-24 DIAGNOSIS — Z23 Encounter for immunization: Secondary | ICD-10-CM | POA: Insufficient documentation

## 2014-12-24 DIAGNOSIS — S60141A Contusion of right ring finger with damage to nail, initial encounter: Secondary | ICD-10-CM | POA: Insufficient documentation

## 2014-12-24 DIAGNOSIS — S6010XA Contusion of unspecified finger with damage to nail, initial encounter: Secondary | ICD-10-CM

## 2014-12-24 DIAGNOSIS — K219 Gastro-esophageal reflux disease without esophagitis: Secondary | ICD-10-CM | POA: Insufficient documentation

## 2014-12-24 DIAGNOSIS — F319 Bipolar disorder, unspecified: Secondary | ICD-10-CM | POA: Insufficient documentation

## 2014-12-24 DIAGNOSIS — S62664A Nondisplaced fracture of distal phalanx of right ring finger, initial encounter for closed fracture: Secondary | ICD-10-CM | POA: Insufficient documentation

## 2014-12-24 DIAGNOSIS — S62609A Fracture of unspecified phalanx of unspecified finger, initial encounter for closed fracture: Secondary | ICD-10-CM

## 2014-12-24 DIAGNOSIS — Y998 Other external cause status: Secondary | ICD-10-CM | POA: Insufficient documentation

## 2014-12-24 DIAGNOSIS — W208XXA Other cause of strike by thrown, projected or falling object, initial encounter: Secondary | ICD-10-CM | POA: Insufficient documentation

## 2014-12-24 DIAGNOSIS — Z79899 Other long term (current) drug therapy: Secondary | ICD-10-CM | POA: Insufficient documentation

## 2014-12-24 DIAGNOSIS — G43909 Migraine, unspecified, not intractable, without status migrainosus: Secondary | ICD-10-CM | POA: Insufficient documentation

## 2014-12-24 MED ORDER — TETANUS-DIPHTH-ACELL PERTUSSIS 5-2.5-18.5 LF-MCG/0.5 IM SUSP
0.5000 mL | Freq: Once | INTRAMUSCULAR | Status: AC
  Administered 2014-12-24: 0.5 mL via INTRAMUSCULAR
  Filled 2014-12-24: qty 0.5

## 2014-12-24 MED ORDER — IBUPROFEN 800 MG PO TABS
800.0000 mg | ORAL_TABLET | Freq: Once | ORAL | Status: AC
  Administered 2014-12-24: 800 mg via ORAL
  Filled 2014-12-24: qty 1

## 2014-12-24 MED ORDER — CIPROFLOXACIN HCL 250 MG PO TABS
500.0000 mg | ORAL_TABLET | Freq: Once | ORAL | Status: AC
  Administered 2014-12-24: 500 mg via ORAL
  Filled 2014-12-24: qty 2

## 2014-12-24 MED ORDER — CIPROFLOXACIN HCL 500 MG PO TABS
500.0000 mg | ORAL_TABLET | Freq: Two times a day (BID) | ORAL | Status: DC
Start: 2014-12-24 — End: 2016-05-12

## 2014-12-24 MED ORDER — LIDOCAINE HCL (PF) 1 % IJ SOLN
5.0000 mL | Freq: Once | INTRAMUSCULAR | Status: AC
  Administered 2014-12-24: 5 mL
  Filled 2014-12-24: qty 5

## 2014-12-24 NOTE — Discharge Instructions (Signed)
Please keep your hand elevated above your heart is much as possible. Please use ice pack. Please see the orthopedic specialist for follow-up of the fracture of your finger. Please alternate Tylenol and ibuprofen for pain. Use Cipro 2 times daily with food. Please see Dr. Melina Copa, or return to the emergency department if any red streaks going up the hand, pus coming from underneath the fingernail, high fever, or deterioration in your general condition. Finger Fracture A finger fracture is when one or more bones in the finger break.  HOME CARE   Wear the splint, tape, or cast as long as told by your doctor.  Keep your fingers in the position your doctor tell you to.  Raise (elevate) the injured area above the level of the heart.  Only take medicine as told by your doctor.  Put ice on the injured area.  Put ice in a plastic bag.  Place a towel between the skin and the bag.  Leave the ice on for 15-20 minutes, 03-04 times a day.  Follow up with your doctor.  Ask what exercises you can do when the splint comes off. GET HELP RIGHT AWAY IF:   The fingernails are white or bluish.  You have pain not helped by medicine.  You cannot move your fingertips.  You lose feeling (numbness) in the injured finger(s). MAKE SURE YOU:   Understand these instructions.  Will watch this condition.  Will get help right away if you are not doing well or get worse. Document Released: 10/21/2007 Document Revised: 07/27/2011 Document Reviewed: 10/21/2007 [facility] Patient Information 2015 [place], [place]. This information is not intended to replace advice given to you by your health care provider. Make sure you discuss any questions you have with your health care provider.  Subungual Hematoma A subungual hematoma is a pocket of blood that collects under the fingernail or toenail. The pressure created by the blood under the nail can cause pain. CAUSES  A subungual hematoma occurs when an injury to the  finger or toe causes a blood vessel beneath the nail to break. The injury can occur from a direct blow such as slamming a finger in a door. It can also occur from a repeated injury such as pressure on the foot in a shoe while running. A subungual hematoma is sometimes called runner's toe or tennis toe. SYMPTOMS   Blue or dark blue skin under the nail.  Pain or throbbing in the injured area. DIAGNOSIS  Your caregiver can determine whether you have a subungual hematoma based on your history and a physical exam. If your caregiver thinks you might have a broken (fractured) bone, X-rays may be taken. TREATMENT  Hematomas usually go away on their own over time. Your caregiver may make a hole in the nail to drain the blood. Draining the blood is painless and usually provides significant relief from pain and throbbing. The nail usually grows back normally after this procedure. In some cases, the nail may need to be removed. This is done if there is a cut under the nail that requires stitches (sutures). HOME CARE INSTRUCTIONS   Put ice on the injured area.  Put ice in a plastic bag.  Place a towel between your skin and the bag.  Leave the ice on for 15-20 minutes, 03-04 times a day for the first 1 to 2 days.  Elevate the injured area to help decrease pain and swelling.  If you were given a bandage, wear it for as long as  directed by your caregiver.  If part of your nail falls off, trim the remaining nail gently. This prevents the nail from catching on something and causing further injury.  Only take over-the-counter or prescription medicines for pain, discomfort, or fever as directed by your caregiver. SEEK IMMEDIATE MEDICAL CARE IF:   You have redness or swelling around the nail.  You have yellowish-white fluid (pus) coming from the nail.  Your pain is not controlled with medicine.  You have a fever. MAKE SURE YOU:  Understand these instructions.  Will watch your condition.  Will  get help right away if you are not doing well or get worse. Document Released: 05/01/2000 Document Revised: 07/27/2011 Document Reviewed: 04/22/2011 [facility] Patient Information 2015 [place], [place]. This information is not intended to replace advice given to you by your health care provider. Make sure you discuss any questions you have with your health care provider.

## 2014-12-24 NOTE — ED Notes (Signed)
Pt dropped a mail box post on her ring finger of rt hand around 1230 today - Has gotten more swollen and purple over course of day - Went to Urgent care and was instructed to come to ER

## 2014-12-24 NOTE — ED Provider Notes (Signed)
CSN: [id number]     Arrival date & time 12/24/14  1641 History  This chart was scribed for non-physician practitioner, Lily Kocher, PA-C, working with Fredia Sorrow, MD, by Stephania Fragmin, ED Scribe. This patient was seen in room APFT23/APFT23 and the patient's care was started at 6:06 PM.    Chief Complaint  Patient presents with  . Finger Injury   Patient is a 44 y.o. female presenting with hand pain. The history is provided by the patient. No language interpreter was used.  Hand Pain This is a new problem. The current episode started 6 to 12 hours ago. The problem occurs constantly. The problem has been gradually worsening. Pertinent negatives include no chest pain, no abdominal pain, no headaches and no shortness of breath. Nothing aggravates the symptoms. Nothing relieves the symptoms. She has tried nothing for the symptoms.    HPI Comments: Judith [name] is a 44 y.o. female with a history of DM, who presents to the Emergency Department complaining of constant, severe, throbbing, right ring finger pain after dropping a mail box post on her finger today, about 6 hours ago. She also complains of associated swelling and bruising. She states she initially thought she just bruised her finger, but it started to throb and become significantly more swollen and bruised, which is why she presents today. Patient denies a history of any bleeding disorders. She also denies a history of any prior surgeries or injuries to her right fingers. She denies any blood thinner use. Patient states she cannot remember the date of her last tetanus vaccine.  Past Medical History  Diagnosis Date  . Migraines   . GERD (gastroesophageal reflux disease)   . Hyperlipemia   . Asthma   . Hypothyroid   . Bipolar 1 disorder    Past Surgical History  Procedure Laterality Date  . Abdominal surgery    . Cholecystectomy    . Cesarean section    . Tonsillectomy    . Thyroidectomy     Family History  Problem Relation Age of  Onset  . Mental illness    . Diabetes     History  Substance Use Topics  . Smoking status: Former Research scientist (life sciences)  . Smokeless tobacco: Never Used  . Alcohol Use: No   OB History    Gravida Para Term Preterm AB TAB SAB Ectopic Multiple Living   1 1 1             Review of Systems  Respiratory: Negative for shortness of breath.   Cardiovascular: Negative for chest pain.  Gastrointestinal: Negative for abdominal pain.  Musculoskeletal: Positive for myalgias and arthralgias.  Skin: Positive for color change (bruising).  Neurological: Negative for headaches.  Hematological: Does not bruise/bleed easily.  All other systems reviewed and are negative.  Allergies  Cephalexin; Diphenhydramine; Prednisone; Erythromycin; and Sulfonamide derivatives  Home Medications   Prior to Admission medications   Medication Sig Start Date End Date Taking? Authorizing Provider  Albuterol Sulfate (PROVENTIL HFA IN) Inhale into the lungs.    Historical Provider, MD  buPROPion (WELLBUTRIN SR) 150 MG 12 hr tablet Take 150 mg by mouth 2 (two) times daily.    Historical Provider, MD  fenofibrate micronized (LOFIBRA) 134 MG capsule Take 134 mg by mouth daily before breakfast.    Historical Provider, MD  lamoTRIgine (LAMICTAL) 200 MG tablet Take 200 mg by mouth daily.    Historical Provider, MD  lisinopril (PRINIVIL,ZESTRIL) 10 MG tablet Take 10 mg by mouth daily.  Historical Provider, MD  montelukast (SINGULAIR) 10 MG tablet Take 10 mg by mouth at bedtime.    Historical Provider, MD  pantoprazole (PROTONIX) 40 MG tablet Take 40 mg by mouth 2 (two) times daily.    Historical Provider, MD  pioglitazone (ACTOS) 15 MG tablet Take 15 mg by mouth daily.    Historical Provider, MD  promethazine (PHENERGAN) 25 MG tablet Take 25 mg by mouth every 6 (six) hours as needed for nausea or vomiting.    Historical Provider, MD  SUMAtriptan Succinate Refill (IMITREX STATDOSE REFILL) 6 MG/0.5ML SOCT Inject into the skin.     Historical Provider, MD  zolpidem (AMBIEN) 10 MG tablet Take 10 mg by mouth at bedtime as needed.   11/11/10   Historical Provider, MD   BP 152/92 mmHg  Pulse 73  Temp(Src) 98.4 F (36.9 C) (Oral)  Resp 18  Ht 5\' 7"  (1.702 m)  Wt 265 lb (120.203 kg)  BMI 41.50 kg/m2  SpO2 99% Physical Exam  Constitutional: She is oriented to person, place, and time. She appears well-developed and well-nourished. No distress.  HENT:  Head: Normocephalic and atraumatic.  Eyes: Conjunctivae and EOM are normal.  Neck: Neck supple. No tracheal deviation present.  Cardiovascular: Normal rate.   Pulmonary/Chest: Effort normal. No respiratory distress.  Musculoskeletal: Normal range of motion. She exhibits tenderness.  FROM of the right shoulder, elbow, and wrist. Swelling of the right ring finger, with a small subungual hematoma. Significant bruising of the palmar surface of the right ring finger.   Neurological: She is alert and oriented to person, place, and time.  Skin: Skin is warm and dry.  Psychiatric: She has a normal mood and affect. Her behavior is normal.  Nursing note and vitals reviewed.   ED Course  Procedures (including critical care time)  DIAGNOSTIC STUDIES: Oxygen Saturation is 99% on RA, normal by my interpretation.    COORDINATION OF CARE: 6:09 PM - XR results reviewed with pt. Discussed treatment plan with pt at bedside which includes release of the subungual hematoma, updating tetanus vaccine, bandaging the finger, and f/u with orthopedist. Pt verbalized understanding and agreed to plan.   Imaging Review Dg Finger Ring Right  12/24/2014   CLINICAL DATA:  Crush injury right ring finger today. Pain. Initial encounter.  EXAM: RIGHT RING FINGER 2+V  COMPARISON:  None.  FINDINGS: Soft tissues about the distal aspect of the right ring finger are swollen. The patient has a nondisplaced tuft fracture. No dislocation or radiopaque foreign body.  IMPRESSION: Nondisplaced tuft fracture right  ring finger.   Electronically Signed   By: Inge Rise M.D.   On: 12/24/2014 17:51    SUBUNGUAL HEMATOMA RELEASE PROCEDURE NOTE The patient's identification was confirmed and consent was obtained. This procedure was performed by Lily Kocher, PA-C, at 7:03 PM. Site: Right ring finger Sterile procedures observed Anesthetic used (type and amt): 1% lidocaine, 3 mL  Site cleaned with betadine, anesthetized (first with topical PainEase, then with lidocaine injection). 18 gauge needle then used to make 2 small holes in the nail, and blood was then expelled by manually squeezing both sides of the finger.  A small amount of blood was released.  A finger splint was applied, and site covered with dry, sterile dressing.  Tetanus ordered Patient tolerated procedure well without complications. Instructions for care discussed verbally and patient provided with additional written instructions for homecare and f/u.   FRACTURE CARE. X-ray results discussed with the patient in terms which he  understands. The need for splinting was discussed with the patient in terms which he understands. She is in agreement with this plan. Patient identified by arm band. Permission for the procedure is given by the patient. The patient was fitted with an aluminum/foam finger splint after the splint was applied, the patient was noted to have good color and good temperature. Patient tolerated procedure without problem. [name] pain will be addressed with alternating Tylenol and ibuprofen at her request.  MDM Vital signs reviewed. X-ray of the right ring finger reveals a nondisplaced fracture of the distal tuft of the ring finger. The examination suggest a subungual hematoma present. This was evacuated here in the emergency department. The patient received a finger splint, and pain management. Patient will follow-up with orthopedics in the next few days.    Final diagnoses:  None    **I personally performed the  services described in this documentation, which was scribed in my presence. The recorded information has been reviewed and is accurate.* I have reviewed nursing notes, vital signs, and all appropriate lab and imaging results for this patient.   Lily Kocher, PA-C 12/24/14 [place], MD 12/27/14 [phone]

## 2015-05-03 ENCOUNTER — Emergency Department: Admit: 2015-05-04 | Payer: PRIVATE HEALTH INSURANCE | Primary: Family Medicine

## 2015-05-03 DIAGNOSIS — M79675 Pain in left toe(s): Secondary | ICD-10-CM

## 2015-05-03 NOTE — ED Notes (Signed)
Patient ambulatory to xray. Steady gait noted.

## 2015-05-03 NOTE — ED Provider Notes (Signed)
HPI Comments: 44 y.o. female with extensive past medical history, please see list, significant for IBS, Asthma, GERD, Eagle's syndrome, Costochondritis, Dysphagia who presents from home with chief complaint of left toe pain. Pt reports "couple day" hx of left toe, 3rd digit numbness, decreased sensation and bruising. Pt states she was seen at [facility] prior to arrival and advised of possible superficial blood clot. Pt denies radiating discomfort, no calf pain or tenderness. No known injury. Also denies chest pain, SOB, abdominal pain, nausea, vomiting, diarrhea, constipation, dysuria, difficulty urinating, fever, back pain or neck pain. There are no other acute medical concerns at this time.    PCP: Moshe Cipro, MD    Note written by Terie Purser, Scribe, as dictated by Loni Beckwith, MD 11:27 PM    The history is provided by the patient. No language interpreter was used.        Past Medical History:   Diagnosis Date   ??? Anal fissure    ??? Anemia NEC      ideopathic   ??? Asthma    ??? Bronchitis    ??? Costochondritis    ??? Detached retina    ??? Dysphagia 12/24/2011   ??? Eagle's syndrome    ??? 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 GERD (gastroesophageal reflux disease)    ??? Gluten intolerance    ??? Hx: UTI (urinary tract infection)    ??? IBS (irritable bowel syndrome)    ??? Nausea & vomiting    ??? Other ill-defined conditions(799.89)      ibs   ??? Other ill-defined conditions(799.89)      torn acl   ??? Other ill-defined conditions(799.89)      detached right retina - self healed   ??? Otitis media    ??? Ovarian cyst    ??? Thyroid disease    ??? Torn ACL        Past Surgical History:   Procedure Laterality Date   ??? Hx other surgical       wisdom teeth,gums   ??? Pr colonoscopy,biopsy  01/07/2009         ??? Pr upper gi endoscopy,biopsy  01/07/2009         ??? Hx gyn     ??? Pr cardiac surg procedure unlist           Family History:   Problem Relation Age of Onset   ??? Other Mother    ??? Anxiety Mother        Social History      Social History   ??? Marital status: SINGLE     Spouse name: N/A   ??? Number of children: N/A   ??? Years of education: N/A     Occupational History   ??? Not on file.     Social History Main Topics   ??? Smoking status: Never Smoker   ??? Smokeless tobacco: Never Used   ??? Alcohol use No      Comment: socially   ??? Drug use: Not on file   ??? Sexual activity: Not on file     Other Topics Concern   ??? Not on file     Social History Narrative    ** Merged History Encounter **              ALLERGIES: Percocet [oxycodone-acetaminophen]; Percocet [oxycodone-acetaminophen]; Wellbutrin [bupropion hcl]; and Wellbutrin [bupropion hcl]    Review of Systems   Constitutional: Negative for fever.   HENT: Negative for  congestion.    Respiratory: Negative for cough and shortness of breath.    Cardiovascular: Negative for chest pain.   Gastrointestinal: Negative for abdominal pain, constipation, diarrhea, nausea and vomiting.   Genitourinary: Negative for difficulty urinating and dysuria.   Musculoskeletal: Negative for back pain and neck pain.   Neurological: Positive for numbness.   All other systems reviewed and are negative.      Vitals:    05/03/15 2231   BP: 130/84   Pulse: 75   Resp: 17   Temp: 98.3 ??F (36.8 ??C)   SpO2: 100%   Weight: 75.8 kg (167 lb)   Height: 5' 5.5" (1.664 m)            Physical Exam   Nursing note and vitals reviewed.       CONSTITUTIONAL: Well-appearing; well-nourished; in no apparent distress  HEAD: Normocephalic; atraumatic  EYES: PERRL; EOM intact; conjunctiva and sclera are clear bilaterally.  ENT: No rhinorrhea; normal pharynx with no tonsillar hypertrophy; mucous membranes pink/moist, no erythema, no exudate.  NECK: Supple; non-tender; no cervical lymphadenopathy  CARD: Normal S1, S2; no murmurs, rubs, or gallops. Regular rate and rhythm.  RESP: Normal respiratory effort; breath sounds clear and equal bilaterally; no wheezes, rhonchi, or rales.  ABD: Normal bowel sounds; non-distended; non-tender; no palpable  organomegaly, no masses, no bruits.  Back Exam: Normal inspection; no vertebral point tenderness, no CVA tenderness. Normal range of motion.  EXT: Normal ROM in all four extremities; non-tender to palpation; no swelling or deformity; distal pulses are normal, no edema.  SKIN: Warm; dry; bruising ecchymosis of the left second and third toe.  NEURO:Alert and oriented x 3, coherent, NII-XII grossly intact, sensory and motor are non-focal.        MDM  Number of Diagnoses or Management Options  Raynaud's disease without gangrene:   Toe pain, left:   Diagnosis management comments: Assessment: numbness and ecchymosis of the left third and fourth toe-suspect Raynaud phenomena rule out thrombosis/trauma      Plan: Lab/ extremity left foot/ serial exam/ Monitor and Reevaluate.         Amount and/or Complexity of Data Reviewed  Clinical lab tests: ordered and reviewed  Tests in the radiology section of CPT??: ordered and reviewed  Tests in the medicine section of CPT??: reviewed and ordered  Discussion of test results with the performing providers: yes  Decide to obtain previous medical records or to obtain history from someone other than the patient: yes  Obtain history from someone other than the patient: yes  Review and summarize past medical records: yes  Discuss the patient with other providers: yes  Independent visualization of images, tracings, or specimens: yes    Risk of Complications, Morbidity, and/or Mortality  Presenting problems: moderate  Diagnostic procedures: moderate  Management options: moderate      ED Course       Procedures     XRAY INTERPRETATION (ED MD)  Xray of left foot shows no fracture. No subluxation/dislocation. No bony abnormality.  Loni Beckwith, MD 11:35 PM      Progress Note:   Pt has been reexamined by Loni Beckwith, MD. Pt is feeling much better. Symptoms have improved. All available results have been reviewed with pt and any available family. Pt understands sx, dx, and tx in ED. Care plan  has been outlined and questions have been answered. Pt is ready to go home. Will send home on left toe and foot pain/ Raynaud Dz instructions. Outpatient  referral with PCP as needed. Written by Loni BeckwithWilliam Lesta Limbert, MD,12:43 AM    .   .

## 2015-05-03 NOTE — ED Triage Notes (Signed)
TRIAGE NOTE: Pt arrives from [facility] with complaint of numbness to left third toe.  MD at [facility] was concerned for superficial blood clot.

## 2015-05-04 ENCOUNTER — Inpatient Hospital Stay
Admit: 2015-05-04 | Discharge: 2015-05-04 | Disposition: A | Discharge status: Home / Self Care | Payer: PRIVATE HEALTH INSURANCE | Attending: Emergency Medicine

## 2015-05-04 LAB — CBC WITH AUTOMATED DIFF
ABS. BASOPHILS: 0 10*3/uL (ref 0.0–0.1)
ABS. EOSINOPHILS: 0.1 10*3/uL (ref 0.0–0.4)
ABS. LYMPHOCYTES: 1.4 10*3/uL (ref 0.8–3.5)
ABS. MONOCYTES: 0.6 10*3/uL (ref 0.0–1.0)
ABS. NEUTROPHILS: 3.6 10*3/uL (ref 1.8–8.0)
BASOPHILS: 0 % (ref 0–1)
EOSINOPHILS: 1 % (ref 0–7)
HCT: 40.5 % (ref 35.0–47.0)
HGB: 13.5 g/dL (ref 11.5–16.0)
LYMPHOCYTES: 25 % (ref 12–49)
MCH: 28.3 PG (ref 26.0–34.0)
MCHC: 33.3 g/dL (ref 30.0–36.5)
MCV: 84.9 FL (ref 80.0–99.0)
MONOCYTES: 10 % (ref 5–13)
NEUTROPHILS: 64 % (ref 32–75)
PLATELET: 186 10*3/uL (ref 150–400)
RBC: 4.77 M/uL (ref 3.80–5.20)
RDW: 12.3 % (ref 11.5–14.5)
WBC: 5.7 10*3/uL (ref 3.6–11.0)

## 2015-05-04 LAB — METABOLIC PANEL, COMPREHENSIVE
A-G Ratio: 1.3 (ref 1.1–2.2)
ALT (SGPT): 23 U/L (ref 12–78)
AST (SGOT): 16 U/L (ref 15–37)
Albumin: 4.3 g/dL (ref 3.5–5.0)
Alk. phosphatase: 91 U/L (ref 45–117)
Anion gap: 8 mmol/L (ref 5–15)
BUN/Creatinine ratio: 25 — ABNORMAL HIGH (ref 12–20)
BUN: 17 MG/DL (ref 6–20)
Bilirubin, total: 0.5 MG/DL (ref 0.2–1.0)
CO2: 30 mmol/L (ref 21–32)
Calcium: 9 MG/DL (ref 8.5–10.1)
Chloride: 104 mmol/L (ref 97–108)
Creatinine: 0.68 MG/DL (ref 0.55–1.02)
GFR est AA: >60 mL/min/{1.73_m2} (ref >60)
GFR est non-AA: >60 mL/min/{1.73_m2} (ref >60)
Globulin: 3.3 g/dL (ref 2.0–4.0)
Glucose: 94 mg/dL (ref 65–100)
Potassium: 4.1 mmol/L (ref 3.5–5.1)
Protein, total: 7.6 g/dL (ref 6.4–8.2)
Sodium: 142 mmol/L (ref 136–145)

## 2015-05-04 LAB — D DIMER: D-dimer: <0.17 mg/L FEU (ref 0.00–0.65)

## 2015-05-04 LAB — PTT: aPTT: 31.8 s (ref 22.1–32.5)

## 2015-05-04 LAB — PROTHROMBIN TIME + INR
INR: 1 (ref 0.9–1.1)
Prothrombin time: 10.1 s (ref 9.0–11.1)

## 2015-05-04 LAB — D-DIMER, QUANTITATIVE: D-Dimer, Quant: <0.17 mg/L FEU (ref 0.00–0.65)

## 2015-05-04 NOTE — ED Notes (Signed)
Bedside shift change report given to Sam, RN (oncoming nurse) by Tamela OddiBetsy, RN (offgoing nurse). Report included the following information SBAR, ED Summary, MAR and Recent Results.     Provided patient with a warm blanket. Denies any other needs or concerns at this time.

## 2015-05-04 NOTE — ED Notes (Signed)
MD reviewed discharge instructions and options with patient; patient verbalized understanding. RN reviewed discharge instructions using teachback method. Pt. Ambulated to exit without difficulty and in no signs of acute distress. Pt was will drive self home.

## 2015-09-02 ENCOUNTER — Encounter

## 2015-09-09 ENCOUNTER — Ambulatory Visit: Payer: BLUE CROSS/BLUE SHIELD | Primary: Family Medicine

## 2016-03-11 ENCOUNTER — Other Ambulatory Visit: Payer: Self-pay | Admitting: *Deleted

## 2016-03-11 MED ORDER — MONTELUKAST SODIUM 10 MG PO TABS: 10.0000 mg | ORAL_TABLET | Freq: Every day | ORAL | 2 refills | Status: DC

## 2016-05-06 ENCOUNTER — Telehealth: Payer: Self-pay | Admitting: *Deleted

## 2016-05-06 NOTE — Telephone Encounter (Signed)
I will have to do this through an appointment.

## 2016-05-12 ENCOUNTER — Encounter: Payer: Self-pay | Admitting: Physician Assistant

## 2016-05-12 ENCOUNTER — Ambulatory Visit (INDEPENDENT_AMBULATORY_CARE_PROVIDER_SITE_OTHER): Payer: Self-pay | Admitting: Physician Assistant

## 2016-05-12 ENCOUNTER — Encounter (INDEPENDENT_AMBULATORY_CARE_PROVIDER_SITE_OTHER): Payer: Self-pay

## 2016-05-12 VITALS — BP 138/81 | HR 82 | Temp 99.1°F | Ht 67.0 in | Wt 268.4 lb

## 2016-05-12 DIAGNOSIS — K219 Gastro-esophageal reflux disease without esophagitis: Secondary | ICD-10-CM | POA: Insufficient documentation

## 2016-05-12 DIAGNOSIS — F39 Unspecified mood [affective] disorder: Secondary | ICD-10-CM

## 2016-05-12 DIAGNOSIS — E118 Type 2 diabetes mellitus with unspecified complications: Secondary | ICD-10-CM | POA: Insufficient documentation

## 2016-05-12 DIAGNOSIS — I10 Essential (primary) hypertension: Secondary | ICD-10-CM

## 2016-05-12 DIAGNOSIS — F988 Other specified behavioral and emotional disorders with onset usually occurring in childhood and adolescence: Secondary | ICD-10-CM | POA: Insufficient documentation

## 2016-05-12 DIAGNOSIS — E119 Type 2 diabetes mellitus without complications: Secondary | ICD-10-CM

## 2016-05-12 DIAGNOSIS — E785 Hyperlipidemia, unspecified: Secondary | ICD-10-CM

## 2016-05-12 DIAGNOSIS — E039 Hypothyroidism, unspecified: Secondary | ICD-10-CM

## 2016-05-12 DIAGNOSIS — F909 Attention-deficit hyperactivity disorder, unspecified type: Secondary | ICD-10-CM

## 2016-05-12 MED ORDER — PANTOPRAZOLE SODIUM 40 MG PO TBEC: 40.0000 mg | DELAYED_RELEASE_TABLET | Freq: Two times a day (BID) | ORAL | 6 refills | Status: DC

## 2016-05-12 MED ORDER — NAPROXEN 500 MG PO TABS: 500.0000 mg | ORAL_TABLET | Freq: Two times a day (BID) | ORAL | 6 refills | Status: DC

## 2016-05-12 MED ORDER — PIOGLITAZONE HCL 15 MG PO TABS: 15.0000 mg | ORAL_TABLET | Freq: Every day | ORAL | 6 refills | Status: DC

## 2016-05-12 MED ORDER — LISINOPRIL 20 MG PO TABS: 20.0000 mg | ORAL_TABLET | Freq: Every day | ORAL | 6 refills | Status: DC

## 2016-05-12 MED ORDER — LAMOTRIGINE 200 MG PO TABS: 200.0000 mg | ORAL_TABLET | Freq: Every day | ORAL | 6 refills | Status: DC

## 2016-05-12 MED ORDER — AMPHETAMINE-DEXTROAMPHETAMINE 10 MG PO TABS: 10.0000 mg | ORAL_TABLET | Freq: Two times a day (BID) | ORAL | 0 refills | Status: DC

## 2016-05-12 MED ORDER — LEVOTHYROXINE SODIUM 100 MCG PO TABS: 100.0000 ug | ORAL_TABLET | Freq: Every day | ORAL | 6 refills | Status: DC

## 2016-05-12 MED ORDER — CETIRIZINE HCL 10 MG PO TABS: 10.0000 mg | ORAL_TABLET | Freq: Every day | ORAL | 11 refills | Status: DC

## 2016-05-12 MED ORDER — TIZANIDINE HCL 4 MG PO CAPS: 4.0000 mg | ORAL_CAPSULE | Freq: Three times a day (TID) | ORAL | 2 refills | Status: DC

## 2016-05-12 MED ORDER — MONTELUKAST SODIUM 10 MG PO TABS: 10.0000 mg | ORAL_TABLET | Freq: Every day | ORAL | 2 refills | Status: DC
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
## 2016-05-12 NOTE — Patient Instructions (Signed)

## 2016-05-25 ENCOUNTER — Other Ambulatory Visit: Payer: Self-pay

## 2016-05-25 MED ORDER — ALBUTEROL SULFATE (2.5 MG/3ML) 0.083% IN NEBU: INHALATION_SOLUTION | RESPIRATORY_TRACT | 2 refills | Status: DC

## 2016-06-07 DIAGNOSIS — R062 Wheezing: Secondary | ICD-10-CM | POA: Diagnosis not present

## 2016-06-07 DIAGNOSIS — R05 Cough: Secondary | ICD-10-CM | POA: Diagnosis not present

## 2016-06-07 DIAGNOSIS — J40 Bronchitis, not specified as acute or chronic: Secondary | ICD-10-CM | POA: Diagnosis not present

## 2016-06-08 DIAGNOSIS — R0981 Nasal congestion: Secondary | ICD-10-CM | POA: Diagnosis not present

## 2016-06-08 DIAGNOSIS — J181 Lobar pneumonia, unspecified organism: Secondary | ICD-10-CM | POA: Diagnosis not present

## 2016-06-08 DIAGNOSIS — F319 Bipolar disorder, unspecified: Secondary | ICD-10-CM | POA: Diagnosis not present

## 2016-06-08 DIAGNOSIS — Z881 Allergy status to other antibiotic agents status: Secondary | ICD-10-CM | POA: Diagnosis not present

## 2016-06-08 DIAGNOSIS — J45909 Unspecified asthma, uncomplicated: Secondary | ICD-10-CM | POA: Diagnosis not present

## 2016-06-08 DIAGNOSIS — Z79899 Other long term (current) drug therapy: Secondary | ICD-10-CM | POA: Diagnosis not present

## 2016-06-08 DIAGNOSIS — E079 Disorder of thyroid, unspecified: Secondary | ICD-10-CM | POA: Diagnosis not present

## 2016-06-08 DIAGNOSIS — R5383 Other fatigue: Secondary | ICD-10-CM | POA: Diagnosis not present

## 2016-06-08 DIAGNOSIS — R070 Pain in throat: Secondary | ICD-10-CM | POA: Diagnosis not present

## 2016-06-08 DIAGNOSIS — Z888 Allergy status to other drugs, medicaments and biological substances status: Secondary | ICD-10-CM | POA: Diagnosis not present

## 2016-06-08 DIAGNOSIS — R05 Cough: Secondary | ICD-10-CM | POA: Diagnosis not present

## 2016-06-08 DIAGNOSIS — K219 Gastro-esophageal reflux disease without esophagitis: Secondary | ICD-10-CM | POA: Diagnosis not present

## 2016-06-08 DIAGNOSIS — Z882 Allergy status to sulfonamides status: Secondary | ICD-10-CM | POA: Diagnosis not present

## 2016-06-08 DIAGNOSIS — Z883 Allergy status to other anti-infective agents status: Secondary | ICD-10-CM | POA: Diagnosis not present

## 2016-06-08 DIAGNOSIS — J189 Pneumonia, unspecified organism: Secondary | ICD-10-CM | POA: Diagnosis not present

## 2016-06-10 ENCOUNTER — Telehealth: Payer: Self-pay | Admitting: Physician Assistant

## 2016-06-11 ENCOUNTER — Telehealth: Payer: Self-pay

## 2016-06-11 NOTE — Telephone Encounter (Signed)
Psychiatrist is recommending pt has a sleep study Please order

## 2016-06-11 NOTE — Telephone Encounter (Signed)
The patient had made her own psychiatry appointment, in [place]? Not sure what she needs.

## 2016-06-11 NOTE — Telephone Encounter (Signed)
What kind of insurance, may need appointment here to have that kind of test approved.

## 2016-06-11 NOTE — Telephone Encounter (Signed)
Pt also requesting RX for a nebulizer

## 2016-06-15 ENCOUNTER — Ambulatory Visit (INDEPENDENT_AMBULATORY_CARE_PROVIDER_SITE_OTHER): Payer: BLUE CROSS/BLUE SHIELD | Admitting: Family Medicine

## 2016-06-15 ENCOUNTER — Encounter: Payer: Self-pay | Admitting: Family Medicine

## 2016-06-15 VITALS — BP 124/85 | HR 114 | Temp 97.3°F | Ht 67.0 in | Wt 265.0 lb

## 2016-06-15 DIAGNOSIS — E039 Hypothyroidism, unspecified: Secondary | ICD-10-CM

## 2016-06-15 DIAGNOSIS — E785 Hyperlipidemia, unspecified: Secondary | ICD-10-CM | POA: Diagnosis not present

## 2016-06-15 DIAGNOSIS — J4541 Moderate persistent asthma with (acute) exacerbation: Secondary | ICD-10-CM | POA: Diagnosis not present

## 2016-06-15 DIAGNOSIS — E119 Type 2 diabetes mellitus without complications: Secondary | ICD-10-CM

## 2016-06-15 LAB — BAYER DCA HB A1C WAIVED: HB A1C (BAYER DCA - WAIVED): 7.1 % — ABNORMAL HIGH (ref <7.0)

## 2016-06-15 MED ORDER — HYDROCODONE-HOMATROPINE 5-1.5 MG/5ML PO SYRP: 5.0000 mL | ORAL_SOLUTION | Freq: Four times a day (QID) | ORAL | 0 refills | Status: DC | PRN

## 2016-06-15 MED ORDER — CEFTRIAXONE SODIUM 1 G IJ SOLR
1.0000 g | Freq: Once | INTRAMUSCULAR | Status: AC
  Administered 2016-06-15: 1 g via INTRAMUSCULAR

## 2016-06-15 NOTE — Progress Notes (Signed)
BP 124/85   Pulse (!) 114   Temp 97.3 F (36.3 C) (Oral)   Ht 5\' 7"  (1.702 m)   Wt 265 lb (120.2 kg)   BMI 41.50 kg/m    Subjective:    Patient ID: Judith [name], female    DOB: Sep 05, 1970, 46 y.o.   MRN: [id number]  HPI: Judith [name] is a 46 y.o. female presenting on 06/15/2016 for Cough [facility] to [place] last Monday and no better. Lower lobe pneumonia. Patient is still taking doxy and tessalon pearls that was given. ); Chest Pain; Ear Pain (bilateral); and Sore Throat   HPI Cough and congestion and pneumonia Patient is coming in because she has been having a persistent cough that has been going on for almost a week and is not improving. She went to [facility] to emergency department about 6 days ago and was diagnosed with a lower lobe pneumonia and started on doxycycline and Tessalon Perles and has been taking them since that time. She feels like she is not improving and still having a significant cough. She denies any fevers or chills or wheezing. She has started to have some pain in her ribs that is significant with coughing and deep inspiration. She says her cough has been worse at night than during the day and does not seem to be improving.  She also wants orders for her follow-up long-term health diagnoses and then will follow up with her PCP after that point.  Relevant past medical, surgical, family and social history reviewed and updated as indicated. Interim medical history since our last visit reviewed. Allergies and medications reviewed and updated.  Review of Systems  Constitutional: Negative for chills and fever.  HENT: Positive for congestion, postnasal drip, rhinorrhea, sinus pressure, sneezing and sore throat. Negative for ear discharge and ear pain.   Eyes: Negative for pain, redness and visual disturbance.  Respiratory: Positive for cough. Negative for chest tightness and shortness of breath.   Cardiovascular: Positive for chest pain. Negative for leg swelling.    Genitourinary: Negative for difficulty urinating and dysuria.  Musculoskeletal: Negative for back pain and gait problem.  Skin: Negative for rash.  Neurological: Negative for light-headedness and headaches.  Psychiatric/Behavioral: Negative for agitation and behavioral problems.  All other systems reviewed and are negative.   Per HPI unless specifically indicated above     Objective:    BP 124/85   Pulse (!) 114   Temp 97.3 F (36.3 C) (Oral)   Ht 5\' 7"  (1.702 m)   Wt 265 lb (120.2 kg)   BMI 41.50 kg/m   Wt Readings from Last 3 Encounters:  06/15/16 265 lb (120.2 kg)  05/12/16 268 lb 6.4 oz (121.7 kg)  12/24/14 265 lb (120.2 kg)    Physical Exam  Constitutional: She is oriented to person, place, and time. She appears well-developed and well-nourished. No distress.  HENT:  Right Ear: Tympanic membrane, external ear and ear canal normal.  Left Ear: Tympanic membrane, external ear and ear canal normal.  Nose: Mucosal edema and rhinorrhea present. No epistaxis. Right sinus exhibits no maxillary sinus tenderness and no frontal sinus tenderness. Left sinus exhibits no maxillary sinus tenderness and no frontal sinus tenderness.  Mouth/Throat: Uvula is midline and mucous membranes are normal. Posterior oropharyngeal edema and posterior oropharyngeal erythema present. No oropharyngeal exudate or tonsillar abscesses.  Eyes: Conjunctivae are normal.  Cardiovascular: Normal rate, regular rhythm, normal heart sounds and intact distal pulses.   No murmur heard. Pulmonary/Chest:  Effort normal. No respiratory distress. She has no wheezes. She has rales in the right lower field.  Musculoskeletal: Normal range of motion. She exhibits no edema or tenderness.  Neurological: She is alert and oriented to person, place, and time. Coordination normal.  Skin: Skin is warm and dry. No rash noted. She is not diaphoretic.  Psychiatric: She has a normal mood and affect. Her behavior is normal.  Vitals  reviewed.     Assessment & Plan:   Problem List Items Addressed This Visit      Endocrine   Hypothyroidism   Type 2 diabetes mellitus without complication, without long-term current use of insulin ([place])     Other   Hyperlipidemia    Other Visit Diagnoses    Moderate persistent asthma with exacerbation    -  Primary   Relevant Medications   cefTRIAXone (ROCEPHIN) injection 1 g (Completed)   HYDROcodone-homatropine (HYCODAN) 5-1.5 MG/5ML syrup   Other Relevant Orders   DME Nebulizer machine      Put in labs for her long-term diagnoses.  Follow up plan: Return if symptoms worsen or fail to improve.  Counseling provided for all of the vaccine components Orders Placed This Encounter  Procedures  . DME [place] Raynard Mapps, MD [facility] 06/15/2016, 12:01 PM

## 2016-06-16 LAB — CBC WITH DIFFERENTIAL/PLATELET
BASOS ABS: 0 10*3/uL (ref 0.0–0.2)
Basos: 1 %
EOS (ABSOLUTE): 0.3 10*3/uL (ref 0.0–0.4)
Eos: 3 %
Hematocrit: 42.2 % (ref 34.0–46.6)
Hemoglobin: 13.7 g/dL (ref 11.1–15.9)
IMMATURE GRANS (ABS): 0 10*3/uL (ref 0.0–0.1)
IMMATURE GRANULOCYTES: 0 %
LYMPHS: 36 %
Lymphocytes Absolute: 2.8 10*3/uL (ref 0.7–3.1)
MCH: 27.9 pg (ref 26.6–33.0)
MCHC: 32.5 g/dL (ref 31.5–35.7)
MCV: 86 fL (ref 79–97)
Monocytes Absolute: 0.5 10*3/uL (ref 0.1–0.9)
Monocytes: 7 %
NEUTROS ABS: 4.1 10*3/uL (ref 1.4–7.0)
NEUTROS PCT: 53 %
PLATELETS: 317 10*3/uL (ref 150–379)
RBC: 4.91 x10E6/uL (ref 3.77–5.28)
RDW: 13.9 % (ref 12.3–15.4)
WBC: 7.8 10*3/uL (ref 3.4–10.8)

## 2016-06-16 LAB — CMP14+EGFR
ALBUMIN: 4.2 g/dL (ref 3.5–5.5)
ALK PHOS: 78 IU/L (ref 39–117)
ALT: 33 IU/L — AB (ref 0–32)
AST: 23 IU/L (ref 0–40)
Albumin/Globulin Ratio: 1.6 (ref 1.2–2.2)
BUN/Creatinine Ratio: 19 (ref 9–23)
BUN: 13 mg/dL (ref 6–24)
Bilirubin Total: 0.2 mg/dL (ref 0.0–1.2)
CALCIUM: 9 mg/dL (ref 8.7–10.2)
CHLORIDE: 100 mmol/L (ref 96–106)
CO2: 26 mmol/L (ref 18–29)
Creatinine, Ser: 0.67 mg/dL (ref 0.57–1.00)
GFR calc Af Amer: 122 mL/min/{1.73_m2} (ref >59)
GFR calc non Af Amer: 106 mL/min/{1.73_m2} (ref >59)
GLOBULIN, TOTAL: 2.6 g/dL (ref 1.5–4.5)
GLUCOSE: 147 mg/dL — AB (ref 65–99)
POTASSIUM: 4.1 mmol/L (ref 3.5–5.2)
Sodium: 143 mmol/L (ref 134–144)
TOTAL PROTEIN: 6.8 g/dL (ref 6.0–8.5)

## 2016-06-16 LAB — TSH: TSH: 0.901 u[IU]/mL (ref 0.450–4.500)

## 2016-06-18 ENCOUNTER — Other Ambulatory Visit: Payer: Self-pay | Admitting: Physician Assistant

## 2016-06-18 ENCOUNTER — Telehealth: Payer: Self-pay | Admitting: Family Medicine

## 2016-06-18 NOTE — Telephone Encounter (Signed)
That is fine to go ahead and send a note for her

## 2016-06-19 ENCOUNTER — Telehealth: Payer: Self-pay | Admitting: Physician Assistant

## 2016-06-19 NOTE — Telephone Encounter (Signed)
Letter is ready for pick-up.

## 2016-06-19 NOTE — Telephone Encounter (Signed)
Spoke with pt and answered her questions.

## 2016-06-19 NOTE — Telephone Encounter (Signed)
[facility] to x-ray 

## 2016-06-22 ENCOUNTER — Other Ambulatory Visit: Payer: Self-pay | Admitting: Physician Assistant

## 2016-06-24 ENCOUNTER — Encounter: Payer: Self-pay | Admitting: Physician Assistant

## 2016-06-24 ENCOUNTER — Ambulatory Visit (INDEPENDENT_AMBULATORY_CARE_PROVIDER_SITE_OTHER): Payer: BLUE CROSS/BLUE SHIELD | Admitting: Physician Assistant

## 2016-06-24 VITALS — BP 112/66 | HR 91 | Temp 99.0°F | Ht 67.0 in | Wt 265.0 lb

## 2016-06-24 DIAGNOSIS — R0683 Snoring: Secondary | ICD-10-CM | POA: Diagnosis not present

## 2016-06-24 DIAGNOSIS — R05 Cough: Secondary | ICD-10-CM

## 2016-06-24 DIAGNOSIS — Z9189 Other specified personal risk factors, not elsewhere classified: Secondary | ICD-10-CM

## 2016-06-24 DIAGNOSIS — R5383 Other fatigue: Secondary | ICD-10-CM

## 2016-06-24 DIAGNOSIS — R0681 Apnea, not elsewhere classified: Secondary | ICD-10-CM

## 2016-06-24 DIAGNOSIS — J4541 Moderate persistent asthma with (acute) exacerbation: Secondary | ICD-10-CM

## 2016-06-24 DIAGNOSIS — R059 Cough, unspecified: Secondary | ICD-10-CM

## 2016-06-24 MED ORDER — BUDESONIDE-FORMOTEROL FUMARATE 160-4.5 MCG/ACT IN AERO: 2.0000 | INHALATION_SPRAY | Freq: Two times a day (BID) | RESPIRATORY_TRACT | 3 refills | Status: DC

## 2016-06-24 MED ORDER — HYDROCODONE-HOMATROPINE 5-1.5 MG/5ML PO SYRP: 5.0000 mL | ORAL_SOLUTION | Freq: Four times a day (QID) | ORAL | 0 refills | Status: DC | PRN

## 2016-06-24 MED ORDER — BENZONATATE 200 MG PO CAPS: 200.0000 mg | ORAL_CAPSULE | Freq: Three times a day (TID) | ORAL | 2 refills | Status: DC

## 2016-06-24 NOTE — Patient Instructions (Signed)

## 2016-06-26 NOTE — Progress Notes (Signed)
BP 112/66   Pulse 91   Temp 99 F (37.2 C) (Oral)   Ht 5\' 7"  (1.702 m)   Wt 265 lb (120.2 kg)   BMI 41.50 kg/m    Subjective:    Patient ID: Judith [name], female    DOB: 07-Oct-1970, 46 y.o.   MRN: [id number]  HPI: Judith [name] is a 46 y.o. female presenting on 06/24/2016 for Referral (Psychiatrist wants patient to have sleep study ) and Follow-up (Rck on pneumonia )  Patient with several days of progressing upper respiratory and bronchial symptoms. Initially there was more upper respiratory congestion. This progressed to having significant cough that is productive throughout the day and severe at night. There is occasional wheezing after coughing. They will sometimes have slight dyspnea on exertion. It is productive mucus that is yellow in color. Denies any blood.  Still with snoring, daytime fatigue. She Never feels rested when she wakes up in the morning. She does not fall asleep while driving she will just sit still for very long.  Relevant past medical, surgical, family and social history reviewed and updated as indicated. Allergies and medications reviewed and updated.  Past Medical History:  Diagnosis Date  . Asthma   . Bipolar 1 disorder ([place])   . Depression   . GERD (gastroesophageal reflux disease)   . Hyperlipemia   . Hypothyroid   . Migraines     Past Surgical History:  Procedure Laterality Date  . ABDOMINAL SURGERY    . CARPAL TUNNEL RELEASE Bilateral 1999  . CESAREAN SECTION    . CHOLECYSTECTOMY    . THYROIDECTOMY    . TONSILLECTOMY      Review of Systems  Constitutional: Positive for chills and fatigue. Negative for activity change and appetite change.  HENT: Positive for congestion, postnasal drip and sore throat.   Eyes: Negative.   Respiratory: Positive for cough and wheezing.   Cardiovascular: Negative.  Negative for chest pain, palpitations and leg swelling.  Gastrointestinal: Negative.   Genitourinary: Negative.   Musculoskeletal: Negative.     Skin: Negative.   Neurological: Positive for headaches.    Allergies as of 06/24/2016      Reactions   Diphenhydramine Other (See Comments)   Hair feels like it is crawling   Prednisone Nausea And Vomiting   Erythromycin Hives   Sulfonamide Derivatives Other (See Comments)   Does not take due to family history      Medication List       Accurate as of 06/24/16 11:59 PM. Always use your most recent med list.          albuterol (2.5 MG/3ML) 0.083% nebulizer solution Commonly known as:  PROVENTIL Nebulize 1 vial three times daily   amphetamine-dextroamphetamine 10 MG tablet Commonly known as:  ADDERALL Take 1 tablet (10 mg total) by mouth 2 (two) times daily with a meal.   amphetamine-dextroamphetamine 10 MG tablet Commonly known as:  ADDERALL Take 1 tablet (10 mg total) by mouth 2 (two) times daily with a meal.   amphetamine-dextroamphetamine 10 MG tablet Commonly known as:  ADDERALL Take 1 tablet (10 mg total) by mouth 2 (two) times daily with a meal.   benzonatate 200 MG capsule Commonly known as:  TESSALON Take 1 capsule (200 mg total) by mouth 3 (three) times daily.   budesonide-formoterol 160-4.5 MCG/ACT inhaler Commonly known as:  SYMBICORT Inhale 2 puffs into the lungs 2 (two) times daily.   buPROPion 300 MG 24 hr tablet Commonly known  as:  WELLBUTRIN XL Take 1 tablet by mouth daily.   BUSPAR PO Take by mouth.   cetirizine 10 MG tablet Commonly known as:  ZYRTEC Take 1 tablet (10 mg total) by mouth daily.   HYDROcodone-homatropine 5-1.5 MG/5ML syrup Commonly known as:  HYCODAN Take 5-10 mLs by mouth every 6 (six) hours as needed for cough.   lamoTRIgine 200 MG tablet Commonly known as:  LAMICTAL Take 1 tablet (200 mg total) by mouth daily.   levothyroxine 100 MCG tablet Commonly known as:  SYNTHROID, LEVOTHROID Take 1 tablet (100 mcg total) by mouth daily before breakfast.   lisinopril 20 MG tablet Commonly known as:  PRINIVIL,ZESTRIL Take 1  tablet (20 mg total) by mouth daily.   lisinopril 10 MG tablet Commonly known as:  PRINIVIL,ZESTRIL TAKE 1 TABLET DAILY   montelukast 10 MG tablet Commonly known as:  SINGULAIR Take 1 tablet (10 mg total) by mouth at bedtime.   naproxen 500 MG tablet Commonly known as:  NAPROSYN Take 1 tablet (500 mg total) by mouth 2 (two) times daily with a meal.   pantoprazole 40 MG tablet Commonly known as:  PROTONIX Take 1 tablet (40 mg total) by mouth 2 (two) times daily.   pioglitazone 15 MG tablet Commonly known as:  ACTOS Take 1 tablet (15 mg total) by mouth daily.   tiZANidine 4 MG capsule Commonly known as:  ZANAFLEX Take 1 capsule (4 mg total) by mouth 3 (three) times daily.          Objective:    BP 112/66   Pulse 91   Temp 99 F (37.2 C) (Oral)   Ht 5\' 7"  (1.702 m)   Wt 265 lb (120.2 kg)   BMI 41.50 kg/m   Allergies  Allergen Reactions  . Diphenhydramine Other (See Comments)    Hair feels like it is crawling  . Prednisone Nausea And Vomiting  . Erythromycin Hives  . Sulfonamide Derivatives Other (See Comments)    Does not take due to family history    Physical Exam  Constitutional: She is oriented to person, place, and time. She appears well-developed and well-nourished.  HENT:  Head: Normocephalic and atraumatic.  Right Ear: There is drainage and tenderness.  Left Ear: There is drainage and tenderness.  Nose: Mucosal edema and rhinorrhea present. Right sinus exhibits maxillary sinus tenderness and frontal sinus tenderness. Left sinus exhibits maxillary sinus tenderness and frontal sinus tenderness.  Mouth/Throat: Oropharyngeal exudate and posterior oropharyngeal erythema present.  Eyes: Conjunctivae and EOM are normal. Pupils are equal, round, and reactive to light.  Neck: Normal range of motion. Neck supple.  Cardiovascular: Normal rate, regular rhythm, normal heart sounds and intact distal pulses.   Pulmonary/Chest: Effort normal. She has wheezes in the  right upper field and the left upper field.  Abdominal: Soft. Bowel sounds are normal.  Neurological: She is alert and oriented to person, place, and time. She has normal reflexes.  Skin: Skin is warm and dry. No rash noted.  Psychiatric: She has a normal mood and affect. Her behavior is normal. Judgment and thought content normal.        Assessment & Plan:   1. At risk for obstructive sleep apnea - Ambulatory referral to Sleep Studies  2. Snoring - Ambulatory referral to Sleep Studies  3. Fatigue, unspecified type - Ambulatory referral to Sleep Studies  4. Apnea - Ambulatory referral to Sleep Studies  5. Cough - benzonatate (TESSALON) 200 MG capsule; Take 1 capsule (200 mg  total) by mouth 3 (three) times daily.  Dispense: 60 capsule; Refill: 2  6. Moderate persistent asthma with exacerbation - HYDROcodone-homatropine (HYCODAN) 5-1.5 MG/5ML syrup; Take 5-10 mLs by mouth every 6 (six) hours as needed for cough.  Dispense: 240 mL; Refill: 0   Continue all other maintenance medications as listed above.  Follow up plan: Return in about 3 months (around 09/21/2016).  Educational handout given for sleep apnea  Terald Sleeper PA-C [place] [address]  [place],  [address] [phone]   06/26/2016, 5:22 PM

## 2016-07-10 ENCOUNTER — Encounter: Payer: Self-pay | Admitting: Family Medicine

## 2016-07-10 ENCOUNTER — Ambulatory Visit (INDEPENDENT_AMBULATORY_CARE_PROVIDER_SITE_OTHER): Payer: BLUE CROSS/BLUE SHIELD | Admitting: Family Medicine

## 2016-07-10 ENCOUNTER — Encounter: Payer: Self-pay | Admitting: *Deleted

## 2016-07-10 VITALS — BP 114/74 | HR 90 | Temp 98.8°F | Ht 67.0 in | Wt 264.0 lb

## 2016-07-10 DIAGNOSIS — J209 Acute bronchitis, unspecified: Secondary | ICD-10-CM

## 2016-07-10 LAB — VERITOR FLU A/B WAIVED
Influenza A: NEGATIVE
Influenza B: NEGATIVE

## 2016-07-10 MED ORDER — AMOXICILLIN-POT CLAVULANATE 875-125 MG PO TABS: 1.0000 | ORAL_TABLET | Freq: Two times a day (BID) | ORAL | 0 refills | Status: DC

## 2016-07-10 MED ORDER — METHYLPREDNISOLONE ACETATE 80 MG/ML IJ SUSP
80.0000 mg | Freq: Once | INTRAMUSCULAR | Status: AC
  Administered 2016-07-10: 80 mg via INTRAMUSCULAR

## 2016-07-10 NOTE — Progress Notes (Signed)
BP 114/74   Pulse 90   Temp 98.8 F (37.1 C) (Oral)   Ht 5\' 7"  (1.702 m)   Wt 264 lb (119.7 kg)   BMI 41.35 kg/m    Subjective:    Patient ID: Judith [name], female    DOB: 01-05-1971, 46 y.o.   MRN: [id number]  HPI: Judith [name] is a 46 y.o. female presenting on 07/10/2016 for Generalized Body Aches; Nasal Congestion; Nausea; and Chills   HPI Chest congestion and achiness and cough Patient has been having chest congestion and achiness and cough is been going on for the past 2 days. It really worsened overnight and that's what brought her in today. She thinks she is having some wheezing along with it. She denies any shortness of breath or fevers or chills her temperature today is 98 in the office. Her cough has been mostly nonproductive. She has been using her albuterol inhalers and Mucinex to try and help with this but does not feel like they are helping.  Relevant past medical, surgical, family and social history reviewed and updated as indicated. Interim medical history since our last visit reviewed. Allergies and medications reviewed and updated.  Review of Systems  Constitutional: Negative for chills and fever.  HENT: Positive for congestion, postnasal drip, rhinorrhea, sinus pressure and sore throat. Negative for ear discharge, ear pain and sneezing.   Eyes: Negative for pain, redness and visual disturbance.  Respiratory: Positive for cough. Negative for chest tightness and shortness of breath.   Cardiovascular: Negative for chest pain and leg swelling.  Genitourinary: Negative for difficulty urinating and dysuria.  Musculoskeletal: Negative for back pain and gait problem.  Skin: Negative for rash.  Neurological: Negative for light-headedness and headaches.  Psychiatric/Behavioral: Negative for agitation and behavioral problems.  All other systems reviewed and are negative.   Per HPI unless specifically indicated above     Objective:    BP 114/74   Pulse 90   Temp  98.8 F (37.1 C) (Oral)   Ht 5\' 7"  (1.702 m)   Wt 264 lb (119.7 kg)   BMI 41.35 kg/m   Wt Readings from Last 3 Encounters:  07/10/16 264 lb (119.7 kg)  06/24/16 265 lb (120.2 kg)  06/15/16 265 lb (120.2 kg)    Physical Exam  Constitutional: She is oriented to person, place, and time. She appears well-developed and well-nourished. No distress.  HENT:  Right Ear: Tympanic membrane, external ear and ear canal normal.  Left Ear: Tympanic membrane, external ear and ear canal normal.  Nose: Mucosal edema and rhinorrhea present. No epistaxis. Right sinus exhibits no maxillary sinus tenderness and no frontal sinus tenderness. Left sinus exhibits no maxillary sinus tenderness and no frontal sinus tenderness.  Mouth/Throat: Uvula is midline and mucous membranes are normal. Posterior oropharyngeal edema and posterior oropharyngeal erythema present. No oropharyngeal exudate or tonsillar abscesses.  Eyes: Conjunctivae are normal.  Neck: Neck supple.  Cardiovascular: Normal rate, regular rhythm, normal heart sounds and intact distal pulses.   No murmur heard. Pulmonary/Chest: Effort normal. No respiratory distress. She has no wheezes. She has rhonchi in the right upper field and the left upper field. She has no rales.  Musculoskeletal: Normal range of motion. She exhibits no edema or tenderness.  Lymphadenopathy:    She has no cervical adenopathy.  Neurological: She is alert and oriented to person, place, and time. Coordination normal.  Skin: Skin is warm and dry. No rash noted. She is not diaphoretic.  Psychiatric: She  has a normal mood and affect. Her behavior is normal.  Vitals reviewed.     Assessment & Plan:   Problem List Items Addressed This Visit    None    Visit Diagnoses    Acute bronchitis, unspecified organism    -  Primary   Relevant Medications   amoxicillin-clavulanate (AUGMENTIN) 875-125 MG tablet   methylPREDNISolone acetate (DEPO-MEDROL) injection 80 mg (Start on  07/10/2016  4:30 PM)   Other Relevant Orders   Veritor Flu A/B Waived      Follow up plan: Return if symptoms worsen or fail to improve.  Counseling provided for all of the vaccine components Orders Placed This Encounter  Procedures  . Veritor Flu A/B Ulyses Southward, MD [facility] 07/10/2016, 4:18 PM

## 2016-07-16 ENCOUNTER — Telehealth: Payer: Self-pay | Admitting: Physician Assistant

## 2016-07-16 MED ORDER — SUMATRIPTAN SUCCINATE 4 MG/0.5ML ~~LOC~~ SOCT: 4.0000 mg | Freq: Once | SUBCUTANEOUS | 11 refills | Status: DC

## 2016-07-16 NOTE — Telephone Encounter (Signed)
Verified with patient pharmacy and rx sent.

## 2016-07-16 NOTE — Telephone Encounter (Signed)
imitrex is not on the med list - please order

## 2016-07-17 ENCOUNTER — Telehealth: Payer: Self-pay | Admitting: Physician Assistant

## 2016-07-17 NOTE — Telephone Encounter (Signed)
Yes, ok to do.

## 2016-07-17 NOTE — Telephone Encounter (Signed)
Pt needed a one time kit refill, her pen needle broke. In looking at yesterdays note ok'd this. Pt has been on 6 mg in the past as well

## 2016-07-21 ENCOUNTER — Institutional Professional Consult (permissible substitution): Payer: BLUE CROSS/BLUE SHIELD | Admitting: Neurology

## 2016-08-05 ENCOUNTER — Institutional Professional Consult (permissible substitution): Payer: BLUE CROSS/BLUE SHIELD | Admitting: Neurology

## 2016-08-05 ENCOUNTER — Telehealth: Payer: Self-pay

## 2016-08-05 NOTE — Telephone Encounter (Signed)
Judith [name] did not show for her new sleep consult appt.

## 2016-08-06 ENCOUNTER — Other Ambulatory Visit: Payer: Self-pay | Admitting: Physician Assistant

## 2016-08-06 DIAGNOSIS — E039 Hypothyroidism, unspecified: Secondary | ICD-10-CM

## 2016-08-13 ENCOUNTER — Encounter: Payer: Self-pay | Admitting: Neurology

## 2016-08-28 ENCOUNTER — Other Ambulatory Visit: Payer: Self-pay | Admitting: Physician Assistant

## 2016-09-08 ENCOUNTER — Other Ambulatory Visit: Payer: Self-pay | Admitting: Family Medicine

## 2016-09-08 DIAGNOSIS — J209 Acute bronchitis, unspecified: Secondary | ICD-10-CM

## 2016-09-21 ENCOUNTER — Telehealth: Payer: Self-pay | Admitting: Emergency Medicine

## 2016-09-21 ENCOUNTER — Encounter: Payer: Self-pay | Admitting: Emergency Medicine

## 2016-09-21 ENCOUNTER — Emergency Department (INDEPENDENT_AMBULATORY_CARE_PROVIDER_SITE_OTHER)
Admission: EM | Admit: 2016-09-21 | Discharge: 2016-09-21 | Disposition: A | Discharge status: Home / Self Care | Payer: BLUE CROSS/BLUE SHIELD | Source: Home / Self Care | Attending: Family Medicine | Admitting: Family Medicine

## 2016-09-21 DIAGNOSIS — J9801 Acute bronchospasm: Secondary | ICD-10-CM

## 2016-09-21 DIAGNOSIS — J069 Acute upper respiratory infection, unspecified: Secondary | ICD-10-CM

## 2016-09-21 DIAGNOSIS — B9789 Other viral agents as the cause of diseases classified elsewhere: Secondary | ICD-10-CM | POA: Diagnosis not present

## 2016-09-21 MED ORDER — METHYLPREDNISOLONE ACETATE 80 MG/ML IJ SUSP
80.0000 mg | Freq: Once | INTRAMUSCULAR | Status: AC
  Administered 2016-09-21: 80 mg via INTRAMUSCULAR

## 2016-09-21 MED ORDER — PROMETHAZINE HCL 25 MG PO TABS: 25.0000 mg | ORAL_TABLET | Freq: Four times a day (QID) | ORAL | 0 refills | Status: DC | PRN

## 2016-09-21 MED ORDER — LEVOFLOXACIN 750 MG PO TABS: 750.0000 mg | ORAL_TABLET | Freq: Every day | ORAL | 0 refills | Status: DC

## 2016-09-21 NOTE — Discharge Instructions (Signed)
Take plain guaifenesin (1200mg  extended release tabs such as Mucinex) twice daily, with plenty of water, for cough and congestion.  May add Pseudoephedrine (30mg , one or two every 4 to 6 hours) for sinus congestion.  Get adequate rest.   May use Afrin nasal spray (or generic oxymetazoline) each morning for about 5 days and then discontinue.  Also recommend using saline nasal spray several times daily and saline nasal irrigation (AYR is a common brand).   Try warm salt water gargles for sore throat.  Stop all antihistamines for now, and other non-prescription cough/cold preparations. Continue Singulair.  Resume Symbicort until symptoms resolved. Begin Levaquin if not improving about one week or if persistent fever develops   Follow-up with family doctor if not improving about10 days.

## 2016-09-21 NOTE — Telephone Encounter (Signed)
Pt request rx for phenergan. States she mentioned this a few minutes ago at her appt but did not receive rx. Please advise.

## 2016-09-21 NOTE — ED Triage Notes (Signed)
Pt c/o nausea and vomiting x2 days. Cough with mucous.

## 2016-09-21 NOTE — ED Provider Notes (Signed)
[name]    CSN: [id number] Arrival date & time: 09/21/16  1354     History   Chief Complaint Chief Complaint  Patient presents with  . Nausea    HPI Judith [name] is a 46 y.o. female.   Patient complains of two day history of typical cold-like symptoms including sinus congestion, fatigue, chills, and cough.  She has a history of asthma controlled with Singulair, and she occasionally uses Symbicort. She notes that she is unable to tolerate oral prednisone, but does OK with injectable steroids.   The history is provided by the patient.    Past Medical History:  Diagnosis Date  . Asthma   . Bipolar 1 disorder ([place])   . Depression   . GERD (gastroesophageal reflux disease)   . Hyperlipemia   . Hypothyroid   . Migraines     Patient Active Problem List   Diagnosis Date Noted  . Attention deficit hyperactivity disorder (ADHD) 05/12/2016  . Episodic mood disorder ([place]) 05/12/2016  . Type 2 diabetes mellitus without complication, without long-term current use of insulin ([place]) 05/12/2016  . Gastroesophageal reflux disease without esophagitis 05/12/2016  . DYSPNEA 07/22/2010  . CHEST PAIN 07/22/2010  . Hypothyroidism 09/13/2007  . Hyperlipidemia 09/13/2007  . ANXIETY DEPRESSION 09/13/2007  . MIGRAINE HEADACHE 09/13/2007  . Essential hypertension 09/13/2007  . ASTHMA 09/13/2007  . ARTHRITIS 09/13/2007  . CHOLELITHIASIS, HX OF 09/13/2007  . NEPHROLITHIASIS, HX OF 09/13/2007  . CARPAL TUNNEL RELEASE, HX OF 09/13/2007    Past Surgical History:  Procedure Laterality Date  . ABDOMINAL SURGERY    . CARPAL TUNNEL RELEASE Bilateral 1999  . CESAREAN SECTION    . CHOLECYSTECTOMY    . THYROIDECTOMY    . TONSILLECTOMY      OB History    Gravida Para Term Preterm AB Living   1 1 1          SAB TAB Ectopic Multiple Live Births                   Home Medications    Prior to Admission medications   Medication Sig Start Date End Date Taking? Authorizing  Provider  albuterol (PROVENTIL) (2.5 MG/3ML) 0.083% nebulizer solution Nebulize 1 vial three times daily 05/25/16   Terald Sleeper, PA-C  budesonide-formoterol [facility]) 160-4.5 MCG/ACT inhaler Inhale 2 puffs into the lungs 2 (two) times daily. 06/24/16   Terald Sleeper, PA-C  buPROPion (WELLBUTRIN XL) 300 MG 24 hr tablet Take 1 tablet by mouth daily.    [provider]  busPIRone (BUSPAR) 15 MG tablet  07/08/16   [provider]  cetirizine (ZYRTEC) 10 MG tablet Take 1 tablet (10 mg total) by mouth daily. 05/12/16   Terald Sleeper, PA-C  HYDROcodone-homatropine (HYCODAN) 5-1.5 MG/5ML syrup Take 5-10 mLs by mouth every 6 (six) hours as needed for cough. 06/24/16   Terald Sleeper, PA-C  lamoTRIgine (LAMICTAL) 200 MG tablet Take 1 tablet (200 mg total) by mouth daily. Patient taking differently: Take 150 mg by mouth 2 (two) times daily. 150mg  BID 05/12/16   Terald Sleeper, PA-C  levofloxacin (LEVAQUIN) 750 MG tablet Take 1 tablet (750 mg total) by mouth daily. (Rx void after 09/29/16) 09/21/16   Kandra Nicolas, MD  levothyroxine (SYNTHROID, LEVOTHROID) 100 MCG tablet TAKE 1 TABLET BY MOUTH DAILY 08/06/16   Terald Sleeper, PA-C  Lisdexamfetamine Dimesylate (VYVANSE PO) Take by mouth.    [provider]  lisinopril (PRINIVIL,ZESTRIL) 20  MG tablet Take 1 tablet (20 mg total) by mouth daily. 05/12/16   Terald Sleeper, PA-C  montelukast (SINGULAIR) 10 MG tablet TAKE 1 TABLET BY MOUTH EVERY NIGHT AT BEDTIME 08/06/16   Terald Sleeper, PA-C  naproxen (NAPROSYN) 500 MG tablet TAKE 1 TABLET BY MOUTH EVERY 12 HOURS FOR PAIN 08/06/16   Terald Sleeper, PA-C  pantoprazole (PROTONIX) 40 MG tablet Take 1 tablet (40 mg total) by mouth 2 (two) times daily. 05/12/16   Terald Sleeper, PA-C  pioglitazone (ACTOS) 15 MG tablet Take 1 tablet (15 mg total) by mouth daily. 05/12/16   Terald Sleeper, PA-C  SUMAtriptan Succinate 4 MG/0.5ML SOCT Inject 4 mg into the skin once. 07/16/16 07/16/16  Terald Sleeper, PA-C   tiZANidine (ZANAFLEX) 4 MG capsule Take 1 capsule (4 mg total) by mouth 3 (three) times daily. 05/12/16   Terald Sleeper, PA-C    Family History Family History  Problem Relation Age of Onset  . Mental illness    . Diabetes      Social History Social History  Substance Use Topics  . Smoking status: Former Research scientist (life sciences)  . Smokeless tobacco: Never Used  . Alcohol use No     Allergies   Diphenhydramine; Prednisone; Erythromycin; and Sulfonamide derivatives   Review of Systems Review of Systems No sore throat + cough No pleuritic pain, but feels tight in anterior chest No wheezing + nasal congestion + post-nasal drainage No sinus pain/pressure No itchy/red eyes No earache No hemoptysis No SOB No fever, + chills No nausea + vomiting, resolved No abdominal pain No diarrhea No urinary symptoms No skin rash + fatigue No myalgias No headache Used OTC meds without relief   Physical Exam Triage Vital Signs ED Triage Vitals  Enc Vitals Group     BP 09/21/16 1414 (!) 152/92     Pulse Rate 09/21/16 1414 89     Resp --      Temp 09/21/16 1414 98.1 F (36.7 C)     Temp Source 09/21/16 1414 Oral     SpO2 09/21/16 1414 96 %     Weight 09/21/16 1415 278 lb (126.1 kg)     Height --      Head Circumference --      Peak Flow --      Pain Score 09/21/16 1415 0     Pain Loc --      Pain Edu? --      Excl. in [place]? --    No data found.   Updated Vital Signs BP (!) 152/92 (BP Location: Right Arm)   Pulse 89   Temp 98.1 F (36.7 C) (Oral)   Wt 278 lb (126.1 kg)   SpO2 96%   BMI 43.54 kg/m   Visual Acuity Right Eye Distance:   Left Eye Distance:   Bilateral Distance:    Right Eye Near:   Left Eye Near:    Bilateral Near:     Physical Exam Nursing notes and Vital Signs reviewed. Appearance:  Patient appears stated age, and in no acute distress Eyes:  Pupils are equal, round, and reactive to light and accomodation.  Extraocular movement is intact.  Conjunctivae  are not inflamed  Ears:  Canals normal.  Tympanic membranes normal.  Nose:  Mildly congested turbinates.  No sinus tenderness.  Pharynx:  Normal Neck:  Supple.  Tender enlarged posterior/lateral nodes are palpated bilaterally  Lungs:  Clear to auscultation.  Breath sounds are equal.  Moving  air well. Heart:  Regular rate and rhythm without murmurs, rubs, or gallops.  Abdomen:  Nontender without masses or hepatosplenomegaly.  Bowel sounds are present.  No CVA or flank tenderness.  Extremities:  No edema.  Skin:  No rash present.    UC Treatments / Results  Labs (all labs ordered are listed, but only abnormal results are displayed) Labs Reviewed - No data to display  EKG  EKG Interpretation None       Radiology No results found.  Procedures Procedures (including critical care time)  Medications Ordered in UC Medications  methylPREDNISolone acetate (DEPO-MEDROL) injection 80 mg (not administered)     Initial Impression / Assessment and Plan / UC Course  I have reviewed the triage vital signs and the nursing notes.  Pertinent labs & imaging results that were available during my care of the patient were reviewed by me and considered in my medical decision making (see chart for details).    Suspect early viral URI.  There is no evidence of bacterial infection today.   Administered Depo Medrol 80mg  IM  Take plain guaifenesin (1200mg  extended release tabs such as Mucinex) twice daily, with plenty of water, for cough and congestion.  May add Pseudoephedrine (30mg , one or two every 4 to 6 hours) for sinus congestion.  Get adequate rest.   May use Afrin nasal spray (or generic oxymetazoline) each morning for about 5 days and then discontinue.  Also recommend using saline nasal spray several times daily and saline nasal irrigation (AYR is a common brand).   Try warm salt water gargles for sore throat.  Stop all antihistamines for now, and other non-prescription cough/cold  preparations. Continue Singulair.  Resume Symbicort until symptoms resolved. Begin Levaquin if not improving about one week or if persistent fever develops (Given a prescription to hold, with an expiration date)  Follow-up with family doctor if not improving about10 days.     Final Clinical Impressions(s) / UC Diagnoses   Final diagnoses:  Viral URI with cough  Bronchospasm    New Prescriptions New Prescriptions   LEVOFLOXACIN (LEVAQUIN) 750 MG TABLET    Take 1 tablet (750 mg total) by mouth daily. (Rx void after 09/29/16)     Kandra Nicolas, MD 09/22/16 1016

## 2016-10-15 ENCOUNTER — Other Ambulatory Visit: Payer: Self-pay | Admitting: Physician Assistant

## 2016-10-26 DIAGNOSIS — Z8669 Personal history of other diseases of the nervous system and sense organs: Secondary | ICD-10-CM | POA: Diagnosis not present

## 2016-10-26 DIAGNOSIS — I517 Cardiomegaly: Secondary | ICD-10-CM | POA: Diagnosis not present

## 2016-10-26 DIAGNOSIS — Z79899 Other long term (current) drug therapy: Secondary | ICD-10-CM | POA: Diagnosis not present

## 2016-10-26 DIAGNOSIS — R42 Dizziness and giddiness: Secondary | ICD-10-CM | POA: Diagnosis not present

## 2016-10-26 DIAGNOSIS — M549 Dorsalgia, unspecified: Secondary | ICD-10-CM | POA: Diagnosis not present

## 2016-10-26 DIAGNOSIS — Z882 Allergy status to sulfonamides status: Secondary | ICD-10-CM | POA: Diagnosis not present

## 2016-10-26 DIAGNOSIS — R0989 Other specified symptoms and signs involving the circulatory and respiratory systems: Secondary | ICD-10-CM | POA: Diagnosis not present

## 2016-10-26 DIAGNOSIS — Z7984 Long term (current) use of oral hypoglycemic drugs: Secondary | ICD-10-CM | POA: Diagnosis not present

## 2016-10-26 DIAGNOSIS — E079 Disorder of thyroid, unspecified: Secondary | ICD-10-CM | POA: Diagnosis not present

## 2016-10-26 DIAGNOSIS — Z883 Allergy status to other anti-infective agents status: Secondary | ICD-10-CM | POA: Diagnosis not present

## 2016-10-26 DIAGNOSIS — Z888 Allergy status to other drugs, medicaments and biological substances status: Secondary | ICD-10-CM | POA: Diagnosis not present

## 2016-10-26 DIAGNOSIS — R079 Chest pain, unspecified: Secondary | ICD-10-CM | POA: Diagnosis not present

## 2016-10-26 DIAGNOSIS — R531 Weakness: Secondary | ICD-10-CM | POA: Diagnosis not present

## 2016-10-26 DIAGNOSIS — R0602 Shortness of breath: Secondary | ICD-10-CM | POA: Diagnosis not present

## 2016-10-26 DIAGNOSIS — K219 Gastro-esophageal reflux disease without esophagitis: Secondary | ICD-10-CM | POA: Diagnosis not present

## 2016-10-26 DIAGNOSIS — Z881 Allergy status to other antibiotic agents status: Secondary | ICD-10-CM | POA: Diagnosis not present

## 2016-10-27 ENCOUNTER — Other Ambulatory Visit: Payer: Self-pay | Admitting: Physician Assistant

## 2016-11-17 DIAGNOSIS — M25561 Pain in right knee: Secondary | ICD-10-CM | POA: Diagnosis not present

## 2016-11-17 DIAGNOSIS — Z87891 Personal history of nicotine dependence: Secondary | ICD-10-CM | POA: Diagnosis not present

## 2016-11-17 DIAGNOSIS — M5442 Lumbago with sciatica, left side: Secondary | ICD-10-CM | POA: Diagnosis not present

## 2016-11-17 DIAGNOSIS — M545 Low back pain: Secondary | ICD-10-CM | POA: Diagnosis not present

## 2016-11-17 DIAGNOSIS — M25562 Pain in left knee: Secondary | ICD-10-CM | POA: Diagnosis not present

## 2016-11-17 DIAGNOSIS — M170 Bilateral primary osteoarthritis of knee: Secondary | ICD-10-CM | POA: Diagnosis not present

## 2016-11-17 DIAGNOSIS — M25762 Osteophyte, left knee: Secondary | ICD-10-CM | POA: Diagnosis not present

## 2016-11-17 DIAGNOSIS — Z888 Allergy status to other drugs, medicaments and biological substances status: Secondary | ICD-10-CM | POA: Diagnosis not present

## 2016-11-17 DIAGNOSIS — M25761 Osteophyte, right knee: Secondary | ICD-10-CM | POA: Diagnosis not present

## 2016-11-17 DIAGNOSIS — Z883 Allergy status to other anti-infective agents status: Secondary | ICD-10-CM | POA: Diagnosis not present

## 2016-11-17 DIAGNOSIS — Z79899 Other long term (current) drug therapy: Secondary | ICD-10-CM | POA: Diagnosis not present

## 2016-11-17 DIAGNOSIS — Z882 Allergy status to sulfonamides status: Secondary | ICD-10-CM | POA: Diagnosis not present

## 2016-11-17 DIAGNOSIS — M2578 Osteophyte, vertebrae: Secondary | ICD-10-CM | POA: Diagnosis not present

## 2016-11-17 DIAGNOSIS — Z7984 Long term (current) use of oral hypoglycemic drugs: Secondary | ICD-10-CM | POA: Diagnosis not present

## 2016-11-17 DIAGNOSIS — M47896 Other spondylosis, lumbar region: Secondary | ICD-10-CM | POA: Diagnosis not present

## 2016-11-17 DIAGNOSIS — M62830 Muscle spasm of back: Secondary | ICD-10-CM | POA: Diagnosis not present

## 2016-11-17 DIAGNOSIS — Z881 Allergy status to other antibiotic agents status: Secondary | ICD-10-CM | POA: Diagnosis not present

## 2016-11-17 DIAGNOSIS — M4807 Spinal stenosis, lumbosacral region: Secondary | ICD-10-CM | POA: Diagnosis not present

## 2016-11-17 DIAGNOSIS — M5441 Lumbago with sciatica, right side: Secondary | ICD-10-CM | POA: Diagnosis not present

## 2016-12-01 DIAGNOSIS — Z79899 Other long term (current) drug therapy: Secondary | ICD-10-CM | POA: Diagnosis not present

## 2016-12-01 DIAGNOSIS — M2341 Loose body in knee, right knee: Secondary | ICD-10-CM | POA: Diagnosis not present

## 2016-12-01 DIAGNOSIS — R2243 Localized swelling, mass and lump, lower limb, bilateral: Secondary | ICD-10-CM | POA: Diagnosis not present

## 2016-12-01 DIAGNOSIS — M1711 Unilateral primary osteoarthritis, right knee: Secondary | ICD-10-CM | POA: Diagnosis not present

## 2016-12-01 DIAGNOSIS — M25562 Pain in left knee: Secondary | ICD-10-CM | POA: Diagnosis not present

## 2016-12-01 DIAGNOSIS — J45909 Unspecified asthma, uncomplicated: Secondary | ICD-10-CM | POA: Diagnosis not present

## 2016-12-01 DIAGNOSIS — Z888 Allergy status to other drugs, medicaments and biological substances status: Secondary | ICD-10-CM | POA: Diagnosis not present

## 2016-12-01 DIAGNOSIS — M1712 Unilateral primary osteoarthritis, left knee: Secondary | ICD-10-CM | POA: Diagnosis not present

## 2016-12-01 DIAGNOSIS — Z87891 Personal history of nicotine dependence: Secondary | ICD-10-CM | POA: Diagnosis not present

## 2016-12-01 DIAGNOSIS — Z881 Allergy status to other antibiotic agents status: Secondary | ICD-10-CM | POA: Diagnosis not present

## 2016-12-01 DIAGNOSIS — F319 Bipolar disorder, unspecified: Secondary | ICD-10-CM | POA: Diagnosis not present

## 2016-12-01 DIAGNOSIS — Z882 Allergy status to sulfonamides status: Secondary | ICD-10-CM | POA: Diagnosis not present

## 2016-12-01 DIAGNOSIS — E079 Disorder of thyroid, unspecified: Secondary | ICD-10-CM | POA: Diagnosis not present

## 2016-12-01 DIAGNOSIS — M25561 Pain in right knee: Secondary | ICD-10-CM | POA: Diagnosis not present

## 2016-12-01 DIAGNOSIS — G8929 Other chronic pain: Secondary | ICD-10-CM | POA: Diagnosis not present

## 2016-12-01 DIAGNOSIS — K219 Gastro-esophageal reflux disease without esophagitis: Secondary | ICD-10-CM | POA: Diagnosis not present

## 2016-12-03 ENCOUNTER — Telehealth: Payer: Self-pay | Admitting: Physician Assistant

## 2016-12-03 NOTE — Telephone Encounter (Signed)
She got Lidocaine from ER and ER won't do prior authorizations  Do you want me to do a prior auth on it?  I did not get anything from her pharmacy

## 2016-12-03 NOTE — Telephone Encounter (Signed)
Can more specific info be obtained from patient? Will be needed for PA.p

## 2016-12-04 MED ORDER — LIDOCAINE 5 % EX OINT
1.0000 | TOPICAL_OINTMENT | CUTANEOUS | 2 refills | Status: DC | PRN
Start: 2016-12-04 — End: 2017-09-22

## 2016-12-04 NOTE — Telephone Encounter (Signed)
Pt was seen at [facility] ED 2x this month about both knees hurting and they wanted her to see Ortho because they believe it is something other than osteoarthritis so they gave her the Lidocaine and she says it is the only thing that makes the pain bearable for her to be able to stand on her feet and work.

## 2016-12-04 NOTE — Telephone Encounter (Signed)
Can more information be gotten?

## 2016-12-04 NOTE — Telephone Encounter (Signed)
pA in review with [employer]

## 2016-12-07 DIAGNOSIS — M25561 Pain in right knee: Secondary | ICD-10-CM | POA: Diagnosis not present

## 2016-12-07 DIAGNOSIS — M25562 Pain in left knee: Secondary | ICD-10-CM | POA: Diagnosis not present

## 2016-12-08 ENCOUNTER — Telehealth: Payer: Self-pay

## 2016-12-08 NOTE — Telephone Encounter (Signed)
noted 

## 2016-12-08 NOTE — Telephone Encounter (Signed)
Insurance denied prior auth for Lidocaine Patch  Must have pain associated with post herpatic neuralgia, diabetic neuropathy or cancer related neuropathy

## 2016-12-14 DIAGNOSIS — M25561 Pain in right knee: Secondary | ICD-10-CM | POA: Diagnosis not present

## 2016-12-14 DIAGNOSIS — M25461 Effusion, right knee: Secondary | ICD-10-CM | POA: Diagnosis not present

## 2016-12-14 DIAGNOSIS — M1711 Unilateral primary osteoarthritis, right knee: Secondary | ICD-10-CM | POA: Diagnosis not present

## 2016-12-22 DIAGNOSIS — M25561 Pain in right knee: Secondary | ICD-10-CM | POA: Diagnosis not present

## 2016-12-22 DIAGNOSIS — S83241A Other tear of medial meniscus, current injury, right knee, initial encounter: Secondary | ICD-10-CM | POA: Diagnosis not present

## 2016-12-30 DIAGNOSIS — M67461 Ganglion, right knee: Secondary | ICD-10-CM | POA: Diagnosis not present

## 2016-12-30 DIAGNOSIS — M7138 Other bursal cyst, other site: Secondary | ICD-10-CM | POA: Diagnosis not present

## 2016-12-30 DIAGNOSIS — S83241A Other tear of medial meniscus, current injury, right knee, initial encounter: Secondary | ICD-10-CM | POA: Diagnosis not present

## 2016-12-30 DIAGNOSIS — F329 Major depressive disorder, single episode, unspecified: Secondary | ICD-10-CM | POA: Diagnosis not present

## 2016-12-30 DIAGNOSIS — M23321 Other meniscus derangements, posterior horn of medial meniscus, right knee: Secondary | ICD-10-CM | POA: Diagnosis not present

## 2016-12-30 DIAGNOSIS — Z888 Allergy status to other drugs, medicaments and biological substances status: Secondary | ICD-10-CM | POA: Diagnosis not present

## 2016-12-30 DIAGNOSIS — M23361 Other meniscus derangements, other lateral meniscus, right knee: Secondary | ICD-10-CM | POA: Diagnosis not present

## 2016-12-30 DIAGNOSIS — E663 Overweight: Secondary | ICD-10-CM | POA: Diagnosis not present

## 2016-12-30 DIAGNOSIS — K219 Gastro-esophageal reflux disease without esophagitis: Secondary | ICD-10-CM | POA: Diagnosis not present

## 2016-12-30 DIAGNOSIS — Z6839 Body mass index (BMI) 39.0-39.9, adult: Secondary | ICD-10-CM | POA: Diagnosis not present

## 2016-12-30 DIAGNOSIS — G8918 Other acute postprocedural pain: Secondary | ICD-10-CM | POA: Diagnosis not present

## 2016-12-30 DIAGNOSIS — Z79899 Other long term (current) drug therapy: Secondary | ICD-10-CM | POA: Diagnosis not present

## 2016-12-30 DIAGNOSIS — M2241 Chondromalacia patellae, right knee: Secondary | ICD-10-CM | POA: Diagnosis not present

## 2016-12-30 DIAGNOSIS — M659 Synovitis and tenosynovitis, unspecified: Secondary | ICD-10-CM | POA: Diagnosis not present

## 2016-12-30 DIAGNOSIS — E119 Type 2 diabetes mellitus without complications: Secondary | ICD-10-CM | POA: Diagnosis not present

## 2016-12-30 DIAGNOSIS — Z87891 Personal history of nicotine dependence: Secondary | ICD-10-CM | POA: Diagnosis not present

## 2016-12-30 DIAGNOSIS — J45909 Unspecified asthma, uncomplicated: Secondary | ICD-10-CM | POA: Diagnosis not present

## 2016-12-30 DIAGNOSIS — Z882 Allergy status to sulfonamides status: Secondary | ICD-10-CM | POA: Diagnosis not present

## 2017-01-15 ENCOUNTER — Encounter: Payer: BLUE CROSS/BLUE SHIELD | Admitting: Physician Assistant

## 2017-02-15 ENCOUNTER — Encounter: Payer: BLUE CROSS/BLUE SHIELD | Admitting: Podiatry

## 2017-02-19 DIAGNOSIS — M25561 Pain in right knee: Secondary | ICD-10-CM | POA: Diagnosis not present

## 2017-02-23 ENCOUNTER — Telehealth: Payer: Self-pay | Admitting: Physician Assistant

## 2017-02-23 DIAGNOSIS — I10 Essential (primary) hypertension: Secondary | ICD-10-CM

## 2017-02-23 DIAGNOSIS — E039 Hypothyroidism, unspecified: Secondary | ICD-10-CM

## 2017-02-23 NOTE — Telephone Encounter (Signed)
I have not gotten anything that all these meds need preauthorization  Think they just need filling

## 2017-02-24 ENCOUNTER — Other Ambulatory Visit: Payer: Self-pay | Admitting: Physician Assistant

## 2017-02-24 DIAGNOSIS — K219 Gastro-esophageal reflux disease without esophagitis: Secondary | ICD-10-CM

## 2017-02-25 ENCOUNTER — Other Ambulatory Visit: Payer: Self-pay | Admitting: Physician Assistant

## 2017-02-25 DIAGNOSIS — K219 Gastro-esophageal reflux disease without esophagitis: Secondary | ICD-10-CM

## 2017-02-25 MED ORDER — PANTOPRAZOLE SODIUM 40 MG PO TBEC: 40.0000 mg | DELAYED_RELEASE_TABLET | Freq: Two times a day (BID) | ORAL | 5 refills | Status: DC

## 2017-02-25 MED ORDER — LEVOTHYROXINE SODIUM 100 MCG PO TABS: 100.0000 ug | ORAL_TABLET | Freq: Every day | ORAL | 5 refills | Status: DC

## 2017-02-25 MED ORDER — MONTELUKAST SODIUM 10 MG PO TABS: 10.0000 mg | ORAL_TABLET | Freq: Every day | ORAL | 5 refills | Status: DC

## 2017-02-25 MED ORDER — LISINOPRIL 20 MG PO TABS: 20.0000 mg | ORAL_TABLET | Freq: Every day | ORAL | 6 refills | Status: DC

## 2017-02-26 DIAGNOSIS — M25561 Pain in right knee: Secondary | ICD-10-CM | POA: Diagnosis not present

## 2017-02-26 DIAGNOSIS — M170 Bilateral primary osteoarthritis of knee: Secondary | ICD-10-CM | POA: Diagnosis not present

## 2017-02-26 DIAGNOSIS — M25562 Pain in left knee: Secondary | ICD-10-CM | POA: Diagnosis not present

## 2017-03-10 DIAGNOSIS — M25561 Pain in right knee: Secondary | ICD-10-CM | POA: Diagnosis not present

## 2017-03-10 DIAGNOSIS — M25562 Pain in left knee: Secondary | ICD-10-CM | POA: Diagnosis not present

## 2017-03-11 NOTE — Progress Notes (Signed)
This encounter was created in error - please disregard.

## 2017-03-16 DIAGNOSIS — M25561 Pain in right knee: Secondary | ICD-10-CM | POA: Diagnosis not present

## 2017-03-16 DIAGNOSIS — M25562 Pain in left knee: Secondary | ICD-10-CM | POA: Diagnosis not present

## 2017-03-17 DIAGNOSIS — M25562 Pain in left knee: Secondary | ICD-10-CM | POA: Diagnosis not present

## 2017-03-17 DIAGNOSIS — M25561 Pain in right knee: Secondary | ICD-10-CM | POA: Diagnosis not present

## 2017-04-10 ENCOUNTER — Other Ambulatory Visit: Payer: Self-pay | Admitting: Physician Assistant

## 2017-04-10 DIAGNOSIS — J019 Acute sinusitis, unspecified: Secondary | ICD-10-CM | POA: Diagnosis not present

## 2017-04-10 DIAGNOSIS — G43909 Migraine, unspecified, not intractable, without status migrainosus: Secondary | ICD-10-CM | POA: Diagnosis not present

## 2017-04-10 DIAGNOSIS — I10 Essential (primary) hypertension: Secondary | ICD-10-CM

## 2017-04-12 NOTE — Telephone Encounter (Signed)
Last seen 07/10/16  [facility]

## 2017-04-15 DIAGNOSIS — M25562 Pain in left knee: Secondary | ICD-10-CM | POA: Diagnosis not present

## 2017-04-15 DIAGNOSIS — M1711 Unilateral primary osteoarthritis, right knee: Secondary | ICD-10-CM | POA: Diagnosis not present

## 2017-04-15 DIAGNOSIS — M25561 Pain in right knee: Secondary | ICD-10-CM | POA: Diagnosis not present

## 2017-04-15 DIAGNOSIS — M1712 Unilateral primary osteoarthritis, left knee: Secondary | ICD-10-CM | POA: Diagnosis not present

## 2017-05-04 ENCOUNTER — Other Ambulatory Visit: Payer: Self-pay | Admitting: Physician Assistant

## 2017-05-04 DIAGNOSIS — K219 Gastro-esophageal reflux disease without esophagitis: Secondary | ICD-10-CM

## 2017-05-04 DIAGNOSIS — E119 Type 2 diabetes mellitus without complications: Secondary | ICD-10-CM

## 2017-05-04 MED ORDER — PIOGLITAZONE HCL 15 MG PO TABS: 15.0000 mg | ORAL_TABLET | Freq: Every day | ORAL | 1 refills | Status: DC

## 2017-05-04 MED ORDER — PANTOPRAZOLE SODIUM 40 MG PO TBEC: 40.0000 mg | DELAYED_RELEASE_TABLET | Freq: Two times a day (BID) | ORAL | 1 refills | Status: DC

## 2017-05-06 ENCOUNTER — Other Ambulatory Visit: Payer: Self-pay | Admitting: Physician Assistant

## 2017-05-06 DIAGNOSIS — K219 Gastro-esophageal reflux disease without esophagitis: Secondary | ICD-10-CM

## 2017-05-06 NOTE — Telephone Encounter (Signed)
Last seen 2.23.18.

## 2017-05-12 ENCOUNTER — Other Ambulatory Visit: Payer: Self-pay

## 2017-05-12 DIAGNOSIS — K219 Gastro-esophageal reflux disease without esophagitis: Secondary | ICD-10-CM

## 2017-05-12 MED ORDER — PANTOPRAZOLE SODIUM 40 MG PO TBEC: 40.0000 mg | DELAYED_RELEASE_TABLET | Freq: Two times a day (BID) | ORAL | 0 refills | Status: DC

## 2017-05-13 DIAGNOSIS — R05 Cough: Secondary | ICD-10-CM | POA: Diagnosis not present

## 2017-05-13 DIAGNOSIS — J40 Bronchitis, not specified as acute or chronic: Secondary | ICD-10-CM | POA: Diagnosis not present

## 2017-05-13 DIAGNOSIS — Z881 Allergy status to other antibiotic agents status: Secondary | ICD-10-CM | POA: Diagnosis not present

## 2017-05-13 DIAGNOSIS — E119 Type 2 diabetes mellitus without complications: Secondary | ICD-10-CM | POA: Diagnosis not present

## 2017-05-13 DIAGNOSIS — R000 Tachycardia, unspecified: Secondary | ICD-10-CM | POA: Diagnosis not present

## 2017-05-13 DIAGNOSIS — M940 Chondrocostal junction syndrome [Tietze]: Secondary | ICD-10-CM | POA: Diagnosis not present

## 2017-05-13 DIAGNOSIS — R5383 Other fatigue: Secondary | ICD-10-CM | POA: Diagnosis not present

## 2017-05-13 DIAGNOSIS — E079 Disorder of thyroid, unspecified: Secondary | ICD-10-CM | POA: Diagnosis not present

## 2017-05-13 DIAGNOSIS — Z883 Allergy status to other anti-infective agents status: Secondary | ICD-10-CM | POA: Diagnosis not present

## 2017-05-13 DIAGNOSIS — Z7984 Long term (current) use of oral hypoglycemic drugs: Secondary | ICD-10-CM | POA: Diagnosis not present

## 2017-05-13 DIAGNOSIS — Z87891 Personal history of nicotine dependence: Secondary | ICD-10-CM | POA: Diagnosis not present

## 2017-05-13 DIAGNOSIS — Z79899 Other long term (current) drug therapy: Secondary | ICD-10-CM | POA: Diagnosis not present

## 2017-05-13 DIAGNOSIS — R490 Dysphonia: Secondary | ICD-10-CM | POA: Diagnosis not present

## 2017-05-13 DIAGNOSIS — Z882 Allergy status to sulfonamides status: Secondary | ICD-10-CM | POA: Diagnosis not present

## 2017-05-13 DIAGNOSIS — K219 Gastro-esophageal reflux disease without esophagitis: Secondary | ICD-10-CM | POA: Diagnosis not present

## 2017-05-13 DIAGNOSIS — R0602 Shortness of breath: Secondary | ICD-10-CM | POA: Diagnosis not present

## 2017-05-13 DIAGNOSIS — Z888 Allergy status to other drugs, medicaments and biological substances status: Secondary | ICD-10-CM | POA: Diagnosis not present

## 2017-05-18 DIAGNOSIS — R05 Cough: Secondary | ICD-10-CM | POA: Diagnosis not present

## 2017-05-18 DIAGNOSIS — Z7984 Long term (current) use of oral hypoglycemic drugs: Secondary | ICD-10-CM | POA: Diagnosis not present

## 2017-05-18 DIAGNOSIS — E119 Type 2 diabetes mellitus without complications: Secondary | ICD-10-CM | POA: Diagnosis not present

## 2017-05-18 DIAGNOSIS — Z9049 Acquired absence of other specified parts of digestive tract: Secondary | ICD-10-CM | POA: Diagnosis not present

## 2017-05-18 DIAGNOSIS — Z79899 Other long term (current) drug therapy: Secondary | ICD-10-CM | POA: Diagnosis not present

## 2017-05-18 DIAGNOSIS — Z888 Allergy status to other drugs, medicaments and biological substances status: Secondary | ICD-10-CM | POA: Diagnosis not present

## 2017-05-18 DIAGNOSIS — K219 Gastro-esophageal reflux disease without esophagitis: Secondary | ICD-10-CM | POA: Diagnosis not present

## 2017-05-18 DIAGNOSIS — R42 Dizziness and giddiness: Secondary | ICD-10-CM | POA: Diagnosis not present

## 2017-05-18 DIAGNOSIS — R1084 Generalized abdominal pain: Secondary | ICD-10-CM | POA: Diagnosis not present

## 2017-05-18 DIAGNOSIS — Z882 Allergy status to sulfonamides status: Secondary | ICD-10-CM | POA: Diagnosis not present

## 2017-05-18 DIAGNOSIS — Z87891 Personal history of nicotine dependence: Secondary | ICD-10-CM | POA: Diagnosis not present

## 2017-05-18 DIAGNOSIS — R197 Diarrhea, unspecified: Secondary | ICD-10-CM | POA: Diagnosis not present

## 2017-05-18 DIAGNOSIS — Z881 Allergy status to other antibiotic agents status: Secondary | ICD-10-CM | POA: Diagnosis not present

## 2017-05-18 DIAGNOSIS — E079 Disorder of thyroid, unspecified: Secondary | ICD-10-CM | POA: Diagnosis not present

## 2017-05-18 DIAGNOSIS — K760 Fatty (change of) liver, not elsewhere classified: Secondary | ICD-10-CM | POA: Diagnosis not present

## 2017-05-18 DIAGNOSIS — Z883 Allergy status to other anti-infective agents status: Secondary | ICD-10-CM | POA: Diagnosis not present

## 2017-05-18 MED ORDER — SODIUM CHLORIDE 0.9 % IV SOLN
1000.00 | INTRAVENOUS | Status: DC
Start: ? — End: 2017-05-18

## 2017-05-31 DIAGNOSIS — M1712 Unilateral primary osteoarthritis, left knee: Secondary | ICD-10-CM | POA: Diagnosis not present

## 2017-05-31 DIAGNOSIS — M1711 Unilateral primary osteoarthritis, right knee: Secondary | ICD-10-CM | POA: Diagnosis not present

## 2017-05-31 DIAGNOSIS — M25562 Pain in left knee: Secondary | ICD-10-CM | POA: Diagnosis not present

## 2017-05-31 DIAGNOSIS — M25561 Pain in right knee: Secondary | ICD-10-CM | POA: Diagnosis not present

## 2017-06-01 ENCOUNTER — Other Ambulatory Visit: Payer: Self-pay | Admitting: Physician Assistant

## 2017-06-01 DIAGNOSIS — I10 Essential (primary) hypertension: Secondary | ICD-10-CM

## 2017-06-01 DIAGNOSIS — E039 Hypothyroidism, unspecified: Secondary | ICD-10-CM

## 2017-06-17 ENCOUNTER — Other Ambulatory Visit: Payer: Self-pay | Admitting: Physician Assistant

## 2017-07-06 DIAGNOSIS — M25562 Pain in left knee: Secondary | ICD-10-CM | POA: Diagnosis not present

## 2017-07-09 DIAGNOSIS — M25562 Pain in left knee: Secondary | ICD-10-CM | POA: Diagnosis not present

## 2017-07-09 DIAGNOSIS — M25462 Effusion, left knee: Secondary | ICD-10-CM | POA: Diagnosis not present

## 2017-07-09 DIAGNOSIS — M948X6 Other specified disorders of cartilage, lower leg: Secondary | ICD-10-CM | POA: Diagnosis not present

## 2017-07-09 DIAGNOSIS — S7012XA Contusion of left thigh, initial encounter: Secondary | ICD-10-CM | POA: Diagnosis not present

## 2017-07-12 DIAGNOSIS — M25562 Pain in left knee: Secondary | ICD-10-CM | POA: Diagnosis not present

## 2017-07-28 DIAGNOSIS — L0231 Cutaneous abscess of buttock: Secondary | ICD-10-CM | POA: Diagnosis not present

## 2017-07-29 ENCOUNTER — Ambulatory Visit: Payer: BLUE CROSS/BLUE SHIELD | Admitting: Podiatry

## 2017-08-13 DIAGNOSIS — M25562 Pain in left knee: Secondary | ICD-10-CM | POA: Diagnosis not present

## 2017-08-13 DIAGNOSIS — M1712 Unilateral primary osteoarthritis, left knee: Secondary | ICD-10-CM | POA: Diagnosis not present

## 2017-08-23 DIAGNOSIS — M25562 Pain in left knee: Secondary | ICD-10-CM | POA: Diagnosis not present

## 2017-09-02 DIAGNOSIS — M25562 Pain in left knee: Secondary | ICD-10-CM | POA: Diagnosis not present

## 2017-09-02 DIAGNOSIS — M25561 Pain in right knee: Secondary | ICD-10-CM | POA: Diagnosis not present

## 2017-09-22 ENCOUNTER — Encounter: Payer: Self-pay | Admitting: Physician Assistant

## 2017-09-22 ENCOUNTER — Ambulatory Visit: Payer: BLUE CROSS/BLUE SHIELD | Admitting: Physician Assistant

## 2017-09-22 VITALS — BP 129/79 | HR 87 | Temp 98.2°F | Ht 67.0 in | Wt 262.8 lb

## 2017-09-22 DIAGNOSIS — E039 Hypothyroidism, unspecified: Secondary | ICD-10-CM

## 2017-09-22 DIAGNOSIS — M1711 Unilateral primary osteoarthritis, right knee: Secondary | ICD-10-CM

## 2017-09-22 DIAGNOSIS — I10 Essential (primary) hypertension: Secondary | ICD-10-CM | POA: Diagnosis not present

## 2017-09-22 DIAGNOSIS — E119 Type 2 diabetes mellitus without complications: Secondary | ICD-10-CM

## 2017-09-22 DIAGNOSIS — M1712 Unilateral primary osteoarthritis, left knee: Secondary | ICD-10-CM

## 2017-09-22 MED ORDER — LISINOPRIL 20 MG PO TABS: 20.0000 mg | ORAL_TABLET | Freq: Every day | ORAL | 5 refills | Status: DC

## 2017-09-22 MED ORDER — MONTELUKAST SODIUM 10 MG PO TABS: 10.0000 mg | ORAL_TABLET | Freq: Every day | ORAL | 11 refills | Status: DC

## 2017-09-22 MED ORDER — PANTOPRAZOLE SODIUM 40 MG PO TBEC: 40.0000 mg | DELAYED_RELEASE_TABLET | Freq: Two times a day (BID) | ORAL | 5 refills | Status: DC

## 2017-09-22 MED ORDER — TRAMADOL HCL 50 MG PO TABS: 50.0000 mg | ORAL_TABLET | Freq: Three times a day (TID) | ORAL | 2 refills | Status: DC | PRN

## 2017-09-22 MED ORDER — LEVOTHYROXINE SODIUM 100 MCG PO TABS: 100.0000 ug | ORAL_TABLET | Freq: Every day | ORAL | 5 refills | Status: DC

## 2017-09-22 MED ORDER — PIOGLITAZONE HCL 15 MG PO TABS: 15.0000 mg | ORAL_TABLET | Freq: Every day | ORAL | 5 refills | Status: DC

## 2017-09-22 MED ORDER — CETIRIZINE HCL 10 MG PO TABS: 10.0000 mg | ORAL_TABLET | Freq: Every day | ORAL | 11 refills | Status: DC

## 2017-09-24 DIAGNOSIS — M25561 Pain in right knee: Secondary | ICD-10-CM | POA: Diagnosis not present

## 2017-09-24 DIAGNOSIS — M25562 Pain in left knee: Secondary | ICD-10-CM | POA: Diagnosis not present

## 2017-09-24 NOTE — Progress Notes (Signed)
BP 129/79   Pulse 87   Temp 98.2 F (36.8 C) (Oral)   Ht '5\' 7"'$  (1.702 m)   Wt 262 lb 12.8 oz (119.2 kg)   BMI 41.16 kg/m    Subjective:    Patient ID: Judith [name], female    DOB: 1970/08/20, 47 y.o.   MRN: [id number]  HPI: Judith [name] is a 47 y.o. female presenting on 09/22/2017 for Diabetes; Hypothyroidism; Hyperlipidemia; and Hypertension  This patient comes in for chronic recheck on her chronic conditions which do include diabetes, GERD, hypertension, allergies, hypothyroidism and chronic pain due to degenerative joint disease of the knees.  She will be having a knee surgery in [place].  Her company offers a program to be able to have it there and to have recovery.  She is planning to do that in the coming months.  She was able to try shots in the knees and it did not help and arthroscopic surgery.  Because she has failed these they plan on doing the knee replacements.  She does need updated medications and labs with [place].  There are no other complaints today.  Past Medical History:  Diagnosis Date  . Asthma   . Bipolar 1 disorder ([place])   . Depression   . GERD (gastroesophageal reflux disease)   . Hyperlipemia   . Hypothyroid   . Migraines    Relevant past medical, surgical, family and social history reviewed and updated as indicated. Interim medical history since our last visit reviewed. Allergies and medications reviewed and updated. DATA REVIEWED: CHART IN EPIC  Family History reviewed for pertinent findings.  Review of Systems  Constitutional: Negative.  Negative for activity change, fatigue and fever.  HENT: Negative.   Eyes: Negative.   Respiratory: Negative.  Negative for cough.   Cardiovascular: Negative.  Negative for chest pain.  Gastrointestinal: Negative.  Negative for abdominal pain.  Endocrine: Negative.   Genitourinary: Negative.  Negative for dysuria.  Musculoskeletal: Positive for arthralgias, gait problem and joint swelling.  Skin: Negative.      Allergies as of 09/22/2017      Reactions   Diphenhydramine Other (See Comments)   Hair feels like it is crawling   Morphine Nausea And Vomiting   PCA PUMP N/V IV PUSH IN ER NO PROBLEM PER PT.   Prednisone Nausea And Vomiting   Erythromycin Hives   Sulfonamide Derivatives Other (See Comments)   Does not take due to family history      Medication List        Accurate as of 09/22/17 11:59 PM. Always use your most recent med list.          albuterol (2.5 MG/3ML) 0.083% nebulizer solution Commonly known as:  PROVENTIL Nebulize 1 vial three times daily   budesonide-formoterol 160-4.5 MCG/ACT inhaler Commonly known as:  SYMBICORT Inhale 2 puffs into the lungs 2 (two) times daily.   busPIRone 15 MG tablet Commonly known as:  BUSPAR   cetirizine 10 MG tablet Commonly known as:  ZYRTEC Take 1 tablet (10 mg total) by mouth daily.   cloNIDine 0.2 MG tablet Commonly known as:  CATAPRES   ibuprofen 600 MG tablet Commonly known as:  ADVIL,MOTRIN Take by mouth.   lamoTRIgine 200 MG tablet Commonly known as:  LAMICTAL Take 1 tablet (200 mg total) by mouth daily.   levothyroxine 100 MCG tablet Commonly known as:  SYNTHROID, LEVOTHROID Take 1 tablet (100 mcg total) by mouth daily.   lisinopril 20  MG tablet Commonly known as:  PRINIVIL,ZESTRIL Take 1 tablet (20 mg total) by mouth daily.   montelukast 10 MG tablet Commonly known as:  SINGULAIR Take 1 tablet (10 mg total) by mouth at bedtime.   pantoprazole 40 MG tablet Commonly known as:  PROTONIX Take 1 tablet (40 mg total) by mouth 2 (two) times daily.   pioglitazone 15 MG tablet Commonly known as:  ACTOS Take 1 tablet (15 mg total) by mouth daily.   sertraline 100 MG tablet Commonly known as:  ZOLOFT Take by mouth.   SUMAtriptan Succinate 4 MG/0.5ML Soct Inject 4 mg into the skin once.   traMADol 50 MG tablet Commonly known as:  ULTRAM Take 1 tablet (50 mg total) by mouth every 8 (eight) hours as needed  for severe pain.   VYVANSE PO Take by mouth.          Objective:    BP 129/79   Pulse 87   Temp 98.2 F (36.8 C) (Oral)   Ht '5\' 7"'$  (1.702 m)   Wt 262 lb 12.8 oz (119.2 kg)   BMI 41.16 kg/m   Allergies  Allergen Reactions  . Diphenhydramine Other (See Comments)    Hair feels like it is crawling  . Morphine Nausea And Vomiting    PCA PUMP N/V IV PUSH IN ER NO PROBLEM PER PT.  . Prednisone Nausea And Vomiting  . Erythromycin Hives  . Sulfonamide Derivatives Other (See Comments)    Does not take due to family history    Wt Readings from Last 3 Encounters:  09/22/17 262 lb 12.8 oz (119.2 kg)  09/21/16 278 lb (126.1 kg)  07/10/16 264 lb (119.7 kg)    Physical Exam  Constitutional: She is oriented to person, place, and time. She appears well-developed and well-nourished.  HENT:  Head: Normocephalic and atraumatic.  Eyes: Pupils are equal, round, and reactive to light. Conjunctivae and EOM are normal.  Cardiovascular: Normal rate, regular rhythm, normal heart sounds and intact distal pulses.  Pulmonary/Chest: Effort normal and breath sounds normal.  Abdominal: Soft. Bowel sounds are normal.  Neurological: She is alert and oriented to person, place, and time. She has normal reflexes.  Skin: Skin is warm and dry. No rash noted.  Psychiatric: She has a normal mood and affect. Her behavior is normal. Judgment and thought content normal.        Assessment & Plan:   1. Primary osteoarthritis of left knee Knee replacement Rest and ice  2. Primary osteoarthritis of right knee Knee replacement Rest and ice - ibuprofen (ADVIL,MOTRIN) 600 MG tablet; Take by mouth.  3. Essential hypertension Low-salt diet Continue  - CBC with Differential/Platelet; Future - CMP14+EGFR; Future - Lipid panel; Future - lisinopril (PRINIVIL,ZESTRIL) 20 MG tablet; Take 1 tablet (20 mg total) by mouth daily.  Dispense: 30 tablet; Refill: 5  4. Type 2 diabetes mellitus without  complication, without long-term current use of insulin (HCC) - Lipid panel; Future - Bayer DCA Hb A1c Waived; Future - pioglitazone (ACTOS) 15 MG tablet; Take 1 tablet (15 mg total) by mouth daily.  Dispense: 30 tablet; Refill: 5  5. Hypothyroidism, unspecified type - TSH; Future - levothyroxine (SYNTHROID, LEVOTHROID) 100 MCG tablet; Take 1 tablet (100 mcg total) by mouth daily.  Dispense: 30 tablet; Refill: 5   Continue all other maintenance medications as listed above.  Follow up plan: No follow-ups on file.  Educational handout given for [place] PA-C Acacia Villas  [address]  [place], [place] [address] [phone]   09/24/2017, 1:38 PM

## 2017-10-04 ENCOUNTER — Ambulatory Visit: Admit: 2017-10-04 | Discharge: 2017-10-04 | Attending: Nurse Practitioner | Primary: Family Medicine

## 2017-10-04 DIAGNOSIS — W19XXXA Unspecified fall, initial encounter: Secondary | ICD-10-CM

## 2017-10-04 NOTE — Patient Instructions (Signed)
Neck Pain: Care Instructions  Your Care Instructions    You can have neck pain anywhere from the bottom of your head to the top of your shoulders. It can spread to the upper back or arms. Injuries, painting a ceiling, sleeping with your neck twisted, staying in one position for too long, and many other activities can cause neck pain.  Most neck pain gets better with home care. Your doctor may recommend medicine to relieve pain or relax your muscles. He or she may suggest exercise and physical therapy to increase flexibility and relieve stress. You may need to wear a special (cervical) collar to support your neck for a day or two.  Follow-up care is a key part of your treatment and safety. Be sure to make and go to all appointments, and call your doctor if you are having problems. It's also a good idea to know your test results and keep a list of the medicines you take.  How can you care for yourself at home?  ?? Try using a heating pad on a low or medium setting for 15 to 20 minutes every 2 or 3 hours. Try a warm shower in place of one session with the heating pad.  ?? You can also try an ice pack for 10 to 15 minutes every 2 to 3 hours. Put a thin cloth between the ice and your skin.  ?? Take pain medicines exactly as directed.  ? If the doctor gave you a prescription medicine for pain, take it as prescribed.  ? If you are not taking a prescription pain medicine, ask your doctor if you can take an over-the-counter medicine.  ?? If your doctor recommends a cervical collar, wear it exactly as directed.  When should you call for help?  Call your doctor now or seek immediate medical care if:  ?? ?? You have new or worsening numbness in your arms, buttocks or legs.   ?? ?? You have new or worsening weakness in your arms or legs. (This could make it hard to stand up.)   ?? ?? You lose control of your bladder or bowels.   ??Watch closely for changes in your health, and be sure to contact your doctor if:   ?? ?? Your neck pain is getting worse.   ?? ?? You are not getting better after 1 week.   ?? ?? You do not get better as expected.   Where can you learn more?  Go to [employer].  Enter V723 in the search box to learn more about "Neck Pain: Care Instructions."  Current as of: February 04, 2017  Content Version: 11.9  ?? 2006-2018 [employer]. Care instructions adapted under license by [employer] (which disclaims liability or warranty for this information). If you have questions about a medical condition or this instruction, always ask your healthcare professional. [employer] disclaims any warranty or liability for your use of this information.         Head Injury: Care Instructions  Your Care Instructions    Most injuries to the head are minor. Bumps, cuts, and scrapes on the head and face usually heal well and can be treated the same as injuries to other parts of the body.  Although it's rare, once in a while a more serious problem shows up after you are home. So it's good to be on the lookout for symptoms for a day or two.  Follow-up care is a key part of your treatment and  safety. Be sure to make and go to all appointments, and call your doctor if you are having problems. It's also a good idea to know your test results and keep a list of the medicines you take.  How can you care for yourself at home?  ?? Follow your doctor's instructions. He or she will tell you if you need someone to watch you closely for the next 24 hours or longer.  ?? Take it easy for the next few days or more if you are not feeling well.  ?? Ask your doctor when it's okay for you to go back to activities like driving a car, riding a bike, or operating machinery.  When should you call for help?  Call 911 anytime you think you may need emergency care. For example, call if:  ?? ?? You have a seizure.   ?? ?? You passed out (lost consciousness).    ?? ?? You are confused or can't stay awake.   ??Call your doctor now or seek immediate medical care if:  ?? ?? You have new or worse vomiting.   ?? ?? You feel less alert.   ?? ?? You have new weakness or numbness in any part of your body.   ??Watch closely for changes in your health, and be sure to contact your doctor if:  ?? ?? You do not get better as expected.   ?? ?? You have new symptoms, such as headaches, trouble concentrating, or changes in mood.   Where can you learn more?  Go to [employer].  Enter [phone] in the search box to learn more about "Head Injury: Care Instructions."  Current as of: October 18, 2016  Content Version: 11.9  ?? 2006-2018 [employer]. Care instructions adapted under license by [employer] (which disclaims liability or warranty for this information). If you have questions about a medical condition or this instruction, always ask your healthcare professional. [employer] disclaims any warranty or liability for your use of this information.

## 2017-10-04 NOTE — Progress Notes (Signed)
1. Have you been to the ER, urgent care clinic since your last visit?  Hospitalized since your last visit?No    2. Have you seen or consulted any other health care providers outside of the [facility] since your last visit?  Include any pap smears or colon screening. No
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
## 2017-10-05 ENCOUNTER — Inpatient Hospital Stay: Payer: Self-pay | Attending: Nurse Practitioner | Primary: Family Medicine

## 2017-10-05 ENCOUNTER — Encounter

## 2017-10-05 ENCOUNTER — Inpatient Hospital Stay: Admit: 2017-10-05 | Payer: Worker's Compensation | Attending: Nurse Practitioner | Primary: Family Medicine

## 2017-10-05 DIAGNOSIS — M542 Cervicalgia: Secondary | ICD-10-CM

## 2017-10-05 MED ORDER — DIAZEPAM 2 MG TAB
2 mg | ORAL_TABLET | ORAL | 0 refills | Status: DC
Start: 2017-10-05 — End: 2018-03-14

## 2017-10-05 MED ORDER — IBUPROFEN 800 MG TAB
800 mg | ORAL_TABLET | Freq: Three times a day (TID) | ORAL | 0 refills | Status: DC | PRN
Start: 2017-10-05 — End: 2018-03-14

## 2017-10-05 NOTE — Telephone Encounter (Signed)
Thayer Ohm inquired about Ms. Sussman's insurance status for today's imaging and, per Glendale Chard, Theatre manager, this is a Facilities manager.  Chris asked for a reference number which we do not have and he was given Aggie Cosier Kimbrough's phone numbers to contact.

## 2017-10-05 NOTE — Telephone Encounter (Signed)
Patient is scheduled to have an MRI if neck today and is requesting a medication be sent in to help calm nerves prior to procedure.    Message routed to provider to approve refill.

## 2017-10-05 NOTE — Progress Notes (Signed)
MRI of the neck  1, No acute findings including no disc herniation   2. Mild degenerative changes  mild stenosis C 4-5 and C 6-7.   Patient provided results this morning.   She feels better today. Headache resolved with one dose of 800 mg of  Ibuprofen.    May return to work today 10/06/17 wit no restrictions.

## 2017-10-05 NOTE — Progress Notes (Signed)
Patient advised by Brynda Rim Np

## 2017-10-06 NOTE — Progress Notes (Signed)
Patient advised by Debbie Levin Np

## 2017-10-06 NOTE — Progress Notes (Signed)
MRI of the neck  1, No acute findings including no disc herniation   2. Mild degenerative changes  mild stenosis C 4-5 and C 6-7.   Patient provided results this morning.   She feels better today. Headache resolved with one dose of 800 mg of  Ibuprofen.    May return to work today 10/06/17 wit no restrictions.

## 2017-10-14 ENCOUNTER — Encounter: Payer: Self-pay | Admitting: Physician Assistant

## 2017-10-14 ENCOUNTER — Telehealth: Payer: Self-pay | Admitting: Physician Assistant

## 2017-10-14 ENCOUNTER — Ambulatory Visit (INDEPENDENT_AMBULATORY_CARE_PROVIDER_SITE_OTHER): Payer: BLUE CROSS/BLUE SHIELD | Admitting: Physician Assistant

## 2017-10-14 VITALS — BP 142/93 | HR 95 | Temp 98.3°F | Ht 67.0 in | Wt 257.8 lb

## 2017-10-14 DIAGNOSIS — M1711 Unilateral primary osteoarthritis, right knee: Secondary | ICD-10-CM

## 2017-10-14 DIAGNOSIS — Z0289 Encounter for other administrative examinations: Secondary | ICD-10-CM

## 2017-10-14 DIAGNOSIS — E119 Type 2 diabetes mellitus without complications: Secondary | ICD-10-CM | POA: Diagnosis not present

## 2017-10-14 DIAGNOSIS — I10 Essential (primary) hypertension: Secondary | ICD-10-CM

## 2017-10-14 DIAGNOSIS — M1712 Unilateral primary osteoarthritis, left knee: Secondary | ICD-10-CM

## 2017-10-14 DIAGNOSIS — E039 Hypothyroidism, unspecified: Secondary | ICD-10-CM | POA: Diagnosis not present

## 2017-10-14 LAB — BAYER DCA HB A1C WAIVED: HB A1C (BAYER DCA - WAIVED): 7.2 % — ABNORMAL HIGH (ref <7.0)

## 2017-10-15 LAB — CMP14+EGFR
A/G RATIO: 1.6 (ref 1.2–2.2)
ALT: 21 IU/L (ref 0–32)
AST: 15 IU/L (ref 0–40)
Albumin: 4.3 g/dL (ref 3.5–5.5)
Alkaline Phosphatase: 81 IU/L (ref 39–117)
BILIRUBIN TOTAL: 0.5 mg/dL (ref 0.0–1.2)
BUN/Creatinine Ratio: 13 (ref 9–23)
BUN: 12 mg/dL (ref 6–24)
CHLORIDE: 98 mmol/L (ref 96–106)
CO2: 27 mmol/L (ref 20–29)
Calcium: 9.8 mg/dL (ref 8.7–10.2)
Creatinine, Ser: 0.89 mg/dL (ref 0.57–1.00)
GFR calc Af Amer: 89 mL/min/{1.73_m2} (ref >59)
GFR calc non Af Amer: 77 mL/min/{1.73_m2} (ref >59)
Globulin, Total: 2.7 g/dL (ref 1.5–4.5)
Glucose: 163 mg/dL — ABNORMAL HIGH (ref 65–99)
Potassium: 4.1 mmol/L (ref 3.5–5.2)
Sodium: 139 mmol/L (ref 134–144)
Total Protein: 7 g/dL (ref 6.0–8.5)

## 2017-10-15 LAB — CBC WITH DIFFERENTIAL/PLATELET
BASOS ABS: 0.1 10*3/uL (ref 0.0–0.2)
Basos: 1 %
EOS (ABSOLUTE): 0.2 10*3/uL (ref 0.0–0.4)
Eos: 2 %
Hematocrit: 42 % (ref 34.0–46.6)
Hemoglobin: 13.9 g/dL (ref 11.1–15.9)
IMMATURE GRANS (ABS): 0 10*3/uL (ref 0.0–0.1)
IMMATURE GRANULOCYTES: 0 %
LYMPHS: 32 %
Lymphocytes Absolute: 2.7 10*3/uL (ref 0.7–3.1)
MCH: 27.5 pg (ref 26.6–33.0)
MCHC: 33.1 g/dL (ref 31.5–35.7)
MCV: 83 fL (ref 79–97)
Monocytes Absolute: 0.7 10*3/uL (ref 0.1–0.9)
Monocytes: 8 %
NEUTROS ABS: 4.8 10*3/uL (ref 1.4–7.0)
NEUTROS PCT: 57 %
PLATELETS: 376 10*3/uL (ref 150–450)
RBC: 5.05 x10E6/uL (ref 3.77–5.28)
RDW: 13.2 % (ref 12.3–15.4)
WBC: 8.4 10*3/uL (ref 3.4–10.8)

## 2017-10-15 LAB — LIPID PANEL
CHOL/HDL RATIO: 8.6 ratio — AB (ref 0.0–4.4)
Cholesterol, Total: 231 mg/dL — ABNORMAL HIGH (ref 100–199)
HDL: 27 mg/dL — AB (ref >39)
LDL CALC: 163 mg/dL — AB (ref 0–99)
TRIGLYCERIDES: 203 mg/dL — AB (ref 0–149)
VLDL Cholesterol Cal: 41 mg/dL — ABNORMAL HIGH (ref 5–40)

## 2017-10-15 LAB — TSH: TSH: 7.59 u[IU]/mL — ABNORMAL HIGH (ref 0.450–4.500)

## 2017-10-15 NOTE — Telephone Encounter (Signed)
Judith [name] has paperwork to fill out

## 2017-10-17 NOTE — Progress Notes (Signed)
BP (!) 142/93   Pulse 95   Temp 98.3 F (36.8 C) (Oral)   Ht _0  (1.702 m)   Wt 257 lb 12.8 oz (116.9 kg)   BMI 40.38 kg/m    Subjective:    Patient ID: Judith [name], female    DOB: Aug 26, 1970, 47 y.o.   MRN: [id number]  HPI: Judith [name] is a 47 y.o. female presenting on 10/14/2017 for FMLA paper work and Knee Pain  This patient comes in to discuss her osteoarthritis of both knees.  She had been seen by orthopedic in [place].  Due to the patient's size this provider did not want to perform her surgery.  Her weight was greater than 250 pounds.  She is centrally obese.  Her legs are quite thin and not obese whatsoever.  But he was concerned about her medical conditions.  In the month she would lose weight he would not be able to do it.  Her  [employer] has a program for their employees.  She will be going [place] doctor [place] to [place] the surgery performed.  She will stay there for some time and then returned here for aftercare.  A specific date has not been set yet.  The first orthopedist had her out through 74.  We will start her FMLA at 6 and continue it through 01/15/2018.  She is down 7 pounds since her last visit and is very close to reaching the 250 pound mark.  She will be coming back very soon for this.  She is unable to do any of her current ADLs.  She has buckling of the knee.  She does not report any fall.  When she is feeling weak she does have a cane and a walker.  We will fill out her FMLA and send notes work on her short-term disability as needed.  Past Medical History:  Diagnosis Date  . Asthma   . Bipolar 1 disorder ([place])   . Depression   . GERD (gastroesophageal reflux disease)   . Hyperlipemia   . Hypothyroid   . Migraines    Relevant past medical, surgical, family and social history reviewed and updated as indicated. Interim medical history since our last visit reviewed. Allergies and medications reviewed and updated. DATA  REVIEWED: CHART IN EPIC  Family History reviewed for pertinent findings.  Review of Systems  Constitutional: Negative.  Negative for activity change, fatigue and fever.  HENT: Negative.   Eyes: Negative.   Respiratory: Negative.  Negative for cough.   Cardiovascular: Negative.  Negative for chest pain.  Gastrointestinal: Negative.  Negative for abdominal pain.  Endocrine: Negative.   Genitourinary: Negative.  Negative for dysuria.  Musculoskeletal: Positive for arthralgias, gait problem, joint swelling and myalgias.  Skin: Negative.     Allergies as of 10/14/2017      Reactions   Diphenhydramine Other (See Comments)   Hair feels like it is crawling   Morphine Nausea And Vomiting   PCA PUMP N/V IV PUSH IN ER NO PROBLEM PER PT.   Prednisone Nausea And Vomiting   Erythromycin Hives   Sulfonamide Derivatives Other (See Comments)   Does not take due to family history      Medication List        Accurate as of 10/14/17 11:59 PM. Always use your most recent med list.          albuterol (2.5 MG/3ML) 0.083% nebulizer solution Commonly known as:  PROVENTIL  Nebulize 1 vial three times daily   budesonide-formoterol 160-4.5 MCG/ACT inhaler Commonly known as:  SYMBICORT Inhale 2 puffs into the lungs 2 (two) times daily.   busPIRone 15 MG tablet Commonly known as:  BUSPAR Take 15 mg by mouth 3 (three) times daily.   cetirizine 10 MG tablet Commonly known as:  ZYRTEC Take 1 tablet (10 mg total) by mouth daily.   cloNIDine 0.2 MG tablet Commonly known as:  CATAPRES   ibuprofen 600 MG tablet Commonly known as:  ADVIL,MOTRIN Take by mouth.   lamoTRIgine 200 MG tablet Commonly known as:  LAMICTAL Take 1 tablet (200 mg total) by mouth daily.   levothyroxine 100 MCG tablet Commonly known as:  SYNTHROID, LEVOTHROID Take 1 tablet (100 mcg total) by mouth daily.   lisinopril 20 MG tablet Commonly known as:  PRINIVIL,ZESTRIL Take 1 tablet (20 mg total) by mouth daily.     montelukast 10 MG tablet Commonly known as:  SINGULAIR Take 1 tablet (10 mg total) by mouth at bedtime.   pantoprazole 40 MG tablet Commonly known as:  PROTONIX Take 1 tablet (40 mg total) by mouth 2 (two) times daily.   pioglitazone 15 MG tablet Commonly known as:  ACTOS Take 1 tablet (15 mg total) by mouth daily.   sertraline 25 MG tablet Commonly known as:  ZOLOFT Take 25 mg by mouth daily.   sertraline 100 MG tablet Commonly known as:  ZOLOFT Take 100 mg by mouth daily.   SUMAtriptan Succinate 4 MG/0.5ML Soct Inject 4 mg into the skin once.   traMADol 50 MG tablet Commonly known as:  ULTRAM Take 1 tablet (50 mg total) by mouth every 8 (eight) hours as needed for severe pain.   VYVANSE PO Take by mouth.          Objective:    BP (!) 142/93   Pulse 95   Temp 98.3 F (36.8 C) (Oral)   Ht _0  (1.702 m)   Wt 257 lb 12.8 oz (116.9 kg)   BMI 40.38 kg/m   Allergies  Allergen Reactions  . Diphenhydramine Other (See Comments)    Hair feels like it is crawling  . Morphine Nausea And Vomiting    PCA PUMP N/V IV PUSH IN ER NO PROBLEM PER PT.  . Prednisone Nausea And Vomiting  . Erythromycin Hives  . Sulfonamide Derivatives Other (See Comments)    Does not take due to family history    Wt Readings from Last 3 Encounters:  10/14/17 257 lb 12.8 oz (116.9 kg)  09/22/17 262 lb 12.8 oz (119.2 kg)  09/21/16 278 lb (126.1 kg)    Physical Exam  Constitutional: She is oriented to person, place, and time. She appears well-developed and well-nourished.  HENT:  Head: Normocephalic and atraumatic.  Eyes: Pupils are equal, round, and reactive to light. Conjunctivae and EOM are normal.  Cardiovascular: Normal rate, regular rhythm, normal heart sounds and intact distal pulses.  Pulmonary/Chest: Effort normal and breath sounds normal.  Abdominal: Soft. Bowel sounds are normal.  Musculoskeletal:       Right knee: She exhibits decreased range of motion, swelling and  deformity.       Left knee: She exhibits decreased range of motion, swelling and deformity. Tenderness found.  Neurological: She is alert and oriented to person, place, and time. She has normal reflexes.  Skin: Skin is warm and dry. No rash noted.  Psychiatric: She has a normal mood and affect. Her behavior is normal. Judgment and  thought content normal.    Results for orders placed or performed in visit on 10/14/17  Bayer DCA Hb A1c Waived  Result Value Ref Range   HB A1C (BAYER DCA - WAIVED) 7.2 (H) <7.0 %  TSH  Result Value Ref Range   TSH 7.590 (H) 0.450 - 4.500 uIU/mL  Lipid panel  Result Value Ref Range   Cholesterol, Total 231 (H) 100 - 199 mg/dL   Triglycerides 203 (H) 0 - 149 mg/dL   HDL 27 (L) >39 mg/dL   VLDL Cholesterol Cal 41 (H) 5 - 40 mg/dL   LDL Calculated 163 (H) 0 - 99 mg/dL   Chol/HDL Ratio 8.6 (H) 0.0 - 4.4 ratio  CMP14+EGFR  Result Value Ref Range   Glucose 163 (H) 65 - 99 mg/dL   BUN 12 6 - 24 mg/dL   Creatinine, Ser 0.89 0.57 - 1.00 mg/dL   GFR calc non Af Amer 77 >59 mL/min/1.73   GFR calc Af Amer 89 >59 mL/min/1.73   BUN/Creatinine Ratio 13 9 - 23   Sodium 139 134 - 144 mmol/L   Potassium 4.1 3.5 - 5.2 mmol/L   Chloride 98 96 - 106 mmol/L   CO2 27 20 - 29 mmol/L   Calcium 9.8 8.7 - 10.2 mg/dL   Total Protein 7.0 6.0 - 8.5 g/dL   Albumin 4.3 3.5 - 5.5 g/dL   Globulin, Total 2.7 1.5 - 4.5 g/dL   Albumin/Globulin Ratio 1.6 1.2 - 2.2   Bilirubin Total 0.5 0.0 - 1.2 mg/dL   Alkaline Phosphatase 81 39 - 117 IU/L   AST 15 0 - 40 IU/L   ALT 21 0 - 32 IU/L  CBC with Differential/Platelet  Result Value Ref Range   WBC 8.4 3.4 - 10.8 x10E3/uL   RBC 5.05 3.77 - 5.28 x10E6/uL   Hemoglobin 13.9 11.1 - 15.9 g/dL   Hematocrit 42.0 34.0 - 46.6 %   MCV 83 79 - 97 fL   MCH 27.5 26.6 - 33.0 pg   MCHC 33.1 31.5 - 35.7 g/dL   RDW 13.2 12.3 - 15.4 %   Platelets 376 150 - 450 x10E3/uL   Neutrophils 57 Not Estab. %   Lymphs 32 Not Estab. %   Monocytes 8 Not  Estab. %   Eos 2 Not Estab. %   Basos 1 Not Estab. %   Neutrophils Absolute 4.8 1.4 - 7.0 x10E3/uL   Lymphocytes Absolute 2.7 0.7 - 3.1 x10E3/uL   Monocytes Absolute 0.7 0.1 - 0.9 x10E3/uL   EOS (ABSOLUTE) 0.2 0.0 - 0.4 x10E3/uL   Basophils Absolute 0.1 0.0 - 0.2 x10E3/uL   Immature Granulocytes 0 Not Estab. %   Immature Grans (Abs) 0.0 0.0 - 0.1 x10E3/uL      Assessment & Plan:   1. Primary osteoarthritis of right knee plan Replacement surgery  2. Primary osteoarthritis of left knee Replacement surgery  3. Type 2 diabetes mellitus without complication, without long-term current use of insulin (H[facility]) - Bayer DCA Hb A1c Waived - Lipid panel  4. Hypothyroidism, unspecified type - TSH  5. Essential hypertension - Lipid panel - CMP14+EGFR - CBC with Differential/Platelet   Continue all other maintenance medications as listed above.  Follow up plan: Return in about 3 weeks (around 11/04/2017) for recheck.  Educational handout given for [place] PA-C [place] [address]  [place], [place] [address] [phone]   10/17/2017, 10:20 PM

## 2017-10-18 ENCOUNTER — Other Ambulatory Visit: Payer: Self-pay | Admitting: Physician Assistant

## 2017-10-18 DIAGNOSIS — E039 Hypothyroidism, unspecified: Secondary | ICD-10-CM

## 2017-10-18 MED ORDER — LEVOTHYROXINE SODIUM 125 MCG PO TABS: 125.0000 ug | ORAL_TABLET | Freq: Every day | ORAL | 1 refills | Status: DC

## 2017-10-27 ENCOUNTER — Encounter: Payer: Self-pay | Admitting: *Deleted

## 2017-11-01 ENCOUNTER — Other Ambulatory Visit: Payer: Self-pay | Admitting: Physician Assistant

## 2017-11-01 ENCOUNTER — Telehealth: Payer: Self-pay | Admitting: Physician Assistant

## 2017-11-01 DIAGNOSIS — E119 Type 2 diabetes mellitus without complications: Secondary | ICD-10-CM

## 2017-11-01 DIAGNOSIS — I10 Essential (primary) hypertension: Secondary | ICD-10-CM

## 2017-11-01 NOTE — Telephone Encounter (Signed)
Patient aware of results and patient states that she is not currently on any cholesterol medication. Patient is willing to take if you would like her to. Patient aware you are out of the office today.

## 2017-11-02 ENCOUNTER — Other Ambulatory Visit: Payer: Self-pay | Admitting: Physician Assistant

## 2017-11-02 MED ORDER — ATORVASTATIN CALCIUM 10 MG PO TABS: 10.0000 mg | ORAL_TABLET | Freq: Every day | ORAL | 3 refills | Status: DC

## 2017-11-02 NOTE — Telephone Encounter (Signed)
Detailed message sent to patient

## 2017-11-02 NOTE — Telephone Encounter (Signed)
Sent lipitor to pharmacy. When I went into orders, it said she had been dismissed.

## 2017-11-08 ENCOUNTER — Other Ambulatory Visit: Payer: Self-pay | Admitting: *Deleted

## 2017-11-08 DIAGNOSIS — M1711 Unilateral primary osteoarthritis, right knee: Secondary | ICD-10-CM

## 2017-11-08 MED ORDER — CETIRIZINE HCL 10 MG PO TABS: 10.0000 mg | ORAL_TABLET | Freq: Every day | ORAL | 11 refills | Status: DC

## 2017-11-08 MED ORDER — LISINOPRIL 20 MG PO TABS: 20.0000 mg | ORAL_TABLET | Freq: Every day | ORAL | 5 refills | Status: DC

## 2017-11-08 MED ORDER — IBUPROFEN 600 MG PO TABS: 600.0000 mg | ORAL_TABLET | Freq: Three times a day (TID) | ORAL | 2 refills | Status: DC

## 2017-11-08 MED ORDER — PANTOPRAZOLE SODIUM 40 MG PO TBEC: 40.0000 mg | DELAYED_RELEASE_TABLET | Freq: Two times a day (BID) | ORAL | 5 refills | Status: DC

## 2017-11-08 MED ORDER — MONTELUKAST SODIUM 10 MG PO TABS: 10.0000 mg | ORAL_TABLET | Freq: Every day | ORAL | 11 refills | Status: DC

## 2017-11-08 MED ORDER — PIOGLITAZONE HCL 15 MG PO TABS: 15.0000 mg | ORAL_TABLET | Freq: Every day | ORAL | 5 refills | Status: DC

## 2017-11-12 ENCOUNTER — Ambulatory Visit: Payer: BLUE CROSS/BLUE SHIELD | Admitting: Physician Assistant

## 2017-11-15 ENCOUNTER — Ambulatory Visit: Payer: BLUE CROSS/BLUE SHIELD | Admitting: Physician Assistant

## 2017-11-17 ENCOUNTER — Ambulatory Visit (INDEPENDENT_AMBULATORY_CARE_PROVIDER_SITE_OTHER): Payer: BLUE CROSS/BLUE SHIELD | Admitting: Physician Assistant

## 2017-11-17 ENCOUNTER — Telehealth: Payer: Self-pay | Admitting: Physician Assistant

## 2017-11-17 ENCOUNTER — Encounter: Payer: Self-pay | Admitting: Physician Assistant

## 2017-11-17 VITALS — BP 125/79 | HR 109 | Temp 98.7°F | Ht 67.0 in | Wt 250.1 lb

## 2017-11-17 DIAGNOSIS — E119 Type 2 diabetes mellitus without complications: Secondary | ICD-10-CM

## 2017-11-17 DIAGNOSIS — M1711 Unilateral primary osteoarthritis, right knee: Secondary | ICD-10-CM | POA: Diagnosis not present

## 2017-11-17 DIAGNOSIS — E039 Hypothyroidism, unspecified: Secondary | ICD-10-CM

## 2017-11-17 DIAGNOSIS — M1712 Unilateral primary osteoarthritis, left knee: Secondary | ICD-10-CM

## 2017-11-17 DIAGNOSIS — Z01818 Encounter for other preprocedural examination: Secondary | ICD-10-CM

## 2017-11-17 MED ORDER — IBUPROFEN 800 MG PO TABS: 800.0000 mg | ORAL_TABLET | Freq: Three times a day (TID) | ORAL | 0 refills | Status: DC | PRN

## 2017-11-17 NOTE — Patient Instructions (Signed)
In a few days you may receive a survey in the mail or online from [employer] regarding your visit with us today. Please take a moment to fill this out. Your feedback is very important to our whole office. It can help us better understand your needs as well as improve your experience and satisfaction. Thank you for taking your time to complete it. We care about you.  Mandi Mattioli, PA-C  

## 2017-11-21 NOTE — Progress Notes (Signed)
BP 125/79   Pulse (!) 109   Temp 98.7 F (37.1 C) (Oral)   Ht _0  (1.702 m)   Wt 250 lb 2 oz (113.5 kg)   BMI 39.18 kg/m    Subjective:    Patient ID: Judith [name], female    DOB: 1971-01-25, 47 y.o.   MRN: [id number]  HPI: Judith [name] is a 47 y.o. female presenting on 11/17/2017 for Weight Check (for Knee Surgery)  This patient comes in for a recheck on her chronic knee pain and a weight check.  She has to believe below 250 pounds in order to have her knee replacement performed.  She was down 6 pounds on the previous visit.  At this point she is at 250 in a few ounces.  She is well on her way.  We can also send these notes to the orthopedic surgeon along with labs to be performed in the near future.  Also the notes will be sent to her disability company which is [employer].  We will send a repeat of her disability form with changes in the date and findings.  She expects to be out for several months due to the surgery and rehabilitation from that.  Out through February 14, 2018.  Past Medical History:  Diagnosis Date  . Asthma   . Bipolar 1 disorder ([place])   . Depression   . GERD (gastroesophageal reflux disease)   . Hyperlipemia   . Hypothyroid   . Migraines    Relevant past medical, surgical, family and social history reviewed and updated as indicated. Interim medical history since our last visit reviewed. Allergies and medications reviewed and updated. DATA REVIEWED: CHART IN EPIC  Family History reviewed for pertinent findings.  Review of Systems  Constitutional: Negative.   HENT: Negative.   Eyes: Negative.   Respiratory: Negative.   Gastrointestinal: Negative.   Genitourinary: Negative.   Musculoskeletal: Positive for arthralgias, gait problem, joint swelling and myalgias.    Allergies as of 11/17/2017      Reactions   Diphenhydramine Other (See Comments)   Hair feels like it is crawling   Morphine Nausea And Vomiting   PCA PUMP N/V IV PUSH IN ER NO  PROBLEM PER PT.   Prednisone Nausea And Vomiting   Erythromycin Hives   Sulfonamide Derivatives Other (See Comments)   Does not take due to family history      Medication List        Accurate as of 11/17/17 11:59 PM. Always use your most recent med list.          albuterol (2.5 MG/3ML) 0.083% nebulizer solution Commonly known as:  PROVENTIL Nebulize 1 vial three times daily   atorvastatin 10 MG tablet Commonly known as:  LIPITOR Take 1 tablet (10 mg total) by mouth daily.   budesonide-formoterol 160-4.5 MCG/ACT inhaler Commonly known as:  SYMBICORT Inhale 2 puffs into the lungs 2 (two) times daily.   busPIRone 15 MG tablet Commonly known as:  BUSPAR Take 15 mg by mouth 3 (three) times daily.   cetirizine 10 MG tablet Commonly known as:  ZYRTEC Take 1 tablet (10 mg total) by mouth daily.   cloNIDine 0.2 MG tablet Commonly known as:  CATAPRES   ibuprofen 800 MG tablet Commonly known as:  ADVIL,MOTRIN Take 1 tablet (800 mg total) by mouth every 8 (eight) hours as needed.   lamoTRIgine 200 MG tablet Commonly known as:  LAMICTAL Take 1 tablet (200 mg total)  by mouth daily.   levothyroxine 125 MCG tablet Commonly known as:  SYNTHROID, LEVOTHROID Take 1 tablet (125 mcg total) by mouth daily.   lisinopril 20 MG tablet Commonly known as:  PRINIVIL,ZESTRIL Take 1 tablet (20 mg total) by mouth daily.   montelukast 10 MG tablet Commonly known as:  SINGULAIR Take 1 tablet (10 mg total) by mouth at bedtime.   pantoprazole 40 MG tablet Commonly known as:  PROTONIX Take 1 tablet (40 mg total) by mouth 2 (two) times daily.   pioglitazone 15 MG tablet Commonly known as:  ACTOS Take 1 tablet (15 mg total) by mouth daily.   sertraline 25 MG tablet Commonly known as:  ZOLOFT Take 25 mg by mouth daily.   sertraline 100 MG tablet Commonly known as:  ZOLOFT Take 100 mg by mouth daily.   SUMAtriptan Succinate 4 MG/0.5ML Soct Inject 4 mg into the skin once.     tiZANidine 4 MG tablet Commonly known as:  ZANAFLEX Take 4 mg by mouth every 6 (six) hours as needed for muscle spasms.   traMADol 50 MG tablet Commonly known as:  ULTRAM Take 1 tablet (50 mg total) by mouth every 8 (eight) hours as needed for severe pain.   VYVANSE PO Take by mouth.          Objective:    BP 125/79   Pulse (!) 109   Temp 98.7 F (37.1 C) (Oral)   Ht _0  (1.702 m)   Wt 250 lb 2 oz (113.5 kg)   BMI 39.18 kg/m   Allergies  Allergen Reactions  . Diphenhydramine Other (See Comments)    Hair feels like it is crawling  . Morphine Nausea And Vomiting    PCA PUMP N/V IV PUSH IN ER NO PROBLEM PER PT.  . Prednisone Nausea And Vomiting  . Erythromycin Hives  . Sulfonamide Derivatives Other (See Comments)    Does not take due to family history    Wt Readings from Last 3 Encounters:  11/17/17 250 lb 2 oz (113.5 kg)  10/14/17 257 lb 12.8 oz (116.9 kg)  09/22/17 262 lb 12.8 oz (119.2 kg)    Physical Exam  Constitutional: She is oriented to person, place, and time. She appears well-developed and well-nourished.  HENT:  Head: Normocephalic and atraumatic.  Eyes: Pupils are equal, round, and reactive to light. Conjunctivae and EOM are normal.  Cardiovascular: Normal rate, regular rhythm, normal heart sounds and intact distal pulses.  Pulmonary/Chest: Effort normal and breath sounds normal.  Abdominal: Soft. Bowel sounds are normal.  Neurological: She is alert and oriented to person, place, and time. She has normal reflexes.  Skin: Skin is warm and dry. No rash noted.  Psychiatric: She has a normal mood and affect. Her behavior is normal. Judgment and thought content normal.    Results for orders placed or performed in visit on 10/14/17  Bayer DCA Hb A1c Waived  Result Value Ref Range   HB A1C (BAYER DCA - WAIVED) 7.2 (H) <7.0 %  TSH  Result Value Ref Range   TSH 7.590 (H) 0.450 - 4.500 uIU/mL  Lipid panel  Result Value Ref Range   Cholesterol,  Total 231 (H) 100 - 199 mg/dL   Triglycerides 203 (H) 0 - 149 mg/dL   HDL 27 (L) >39 mg/dL   VLDL Cholesterol Cal 41 (H) 5 - 40 mg/dL   LDL Calculated 163 (H) 0 - 99 mg/dL   Chol/HDL Ratio 8.6 (H) 0.0 - 4.4 ratio  CMP14+EGFR  Result Value Ref Range   Glucose 163 (H) 65 - 99 mg/dL   BUN 12 6 - 24 mg/dL   Creatinine, Ser 0.89 0.57 - 1.00 mg/dL   GFR calc non Af Amer 77 >59 mL/min/1.73   GFR calc Af Amer 89 >59 mL/min/1.73   BUN/Creatinine Ratio 13 9 - 23   Sodium 139 134 - 144 mmol/L   Potassium 4.1 3.5 - 5.2 mmol/L   Chloride 98 96 - 106 mmol/L   CO2 27 20 - 29 mmol/L   Calcium 9.8 8.7 - 10.2 mg/dL   Total Protein 7.0 6.0 - 8.5 g/dL   Albumin 4.3 3.5 - 5.5 g/dL   Globulin, Total 2.7 1.5 - 4.5 g/dL   Albumin/Globulin Ratio 1.6 1.2 - 2.2   Bilirubin Total 0.5 0.0 - 1.2 mg/dL   Alkaline Phosphatase 81 39 - 117 IU/L   AST 15 0 - 40 IU/L   ALT 21 0 - 32 IU/L  CBC with Differential/Platelet  Result Value Ref Range   WBC 8.4 3.4 - 10.8 x10E3/uL   RBC 5.05 3.77 - 5.28 x10E6/uL   Hemoglobin 13.9 11.1 - 15.9 g/dL   Hematocrit 42.0 34.0 - 46.6 %   MCV 83 79 - 97 fL   MCH 27.5 26.6 - 33.0 pg   MCHC 33.1 31.5 - 35.7 g/dL   RDW 13.2 12.3 - 15.4 %   Platelets 376 150 - 450 x10E3/uL   Neutrophils 57 Not Estab. %   Lymphs 32 Not Estab. %   Monocytes 8 Not Estab. %   Eos 2 Not Estab. %   Basos 1 Not Estab. %   Neutrophils Absolute 4.8 1.4 - 7.0 x10E3/uL   Lymphocytes Absolute 2.7 0.7 - 3.1 x10E3/uL   Monocytes Absolute 0.7 0.1 - 0.9 x10E3/uL   EOS (ABSOLUTE) 0.2 0.0 - 0.4 x10E3/uL   Basophils Absolute 0.1 0.0 - 0.2 x10E3/uL   Immature Granulocytes 0 Not Estab. %   Immature Grans (Abs) 0.0 0.0 - 0.1 x10E3/uL      Assessment & Plan:   1. Primary osteoarthritis of left knee ORTHO surgery upcoming  2. Primary osteoarthritis of right knee ORTHO surgery upcoming  3. Morbid obesity ([place]) Continue diet and exercise for weight loss .  Continue all other maintenance medications as  listed above.  Follow up plan: 1 month  Educational handout given for [place] PA-C [place] [address]  [place], [place] [address] [phone]   11/21/2017, 10:49 PM

## 2017-11-22 ENCOUNTER — Encounter: Payer: Self-pay | Admitting: Physician Assistant

## 2017-11-22 NOTE — Telephone Encounter (Signed)
Orders placed. These preoperative notes and labs and EKG will need to be forwarded to her surgeon [place].  The fax number is [phone] and it needs to be attention Burnett Sheng and Nena Jordan.

## 2017-11-22 NOTE — Telephone Encounter (Signed)
p 

## 2017-11-22 NOTE — Telephone Encounter (Signed)
Patient aware to call and make nurse visit.

## 2017-12-16 ENCOUNTER — Ambulatory Visit: Payer: BLUE CROSS/BLUE SHIELD | Admitting: Podiatry

## 2017-12-16 ENCOUNTER — Other Ambulatory Visit: Payer: BLUE CROSS/BLUE SHIELD | Admitting: Orthotics

## 2017-12-20 ENCOUNTER — Ambulatory Visit: Payer: BLUE CROSS/BLUE SHIELD | Admitting: Physician Assistant

## 2018-01-04 ENCOUNTER — Ambulatory Visit: Payer: BLUE CROSS/BLUE SHIELD | Admitting: Physician Assistant

## 2018-01-04 ENCOUNTER — Encounter: Payer: Self-pay | Admitting: Physician Assistant

## 2018-01-04 ENCOUNTER — Ambulatory Visit (INDEPENDENT_AMBULATORY_CARE_PROVIDER_SITE_OTHER): Payer: BLUE CROSS/BLUE SHIELD

## 2018-01-04 VITALS — BP 105/73 | HR 78 | Temp 97.9°F | Ht 67.0 in | Wt 242.0 lb

## 2018-01-04 DIAGNOSIS — M1711 Unilateral primary osteoarthritis, right knee: Secondary | ICD-10-CM

## 2018-01-04 DIAGNOSIS — E119 Type 2 diabetes mellitus without complications: Secondary | ICD-10-CM

## 2018-01-04 DIAGNOSIS — Z01818 Encounter for other preprocedural examination: Secondary | ICD-10-CM

## 2018-01-04 DIAGNOSIS — Z029 Encounter for administrative examinations, unspecified: Secondary | ICD-10-CM

## 2018-01-04 DIAGNOSIS — E039 Hypothyroidism, unspecified: Secondary | ICD-10-CM

## 2018-01-04 DIAGNOSIS — M1712 Unilateral primary osteoarthritis, left knee: Secondary | ICD-10-CM

## 2018-01-04 LAB — BAYER DCA HB A1C WAIVED: HB A1C: 6.9 % (ref <7.0)

## 2018-01-04 MED ORDER — DICLOFENAC SODIUM 1 % TD GEL: 4.0000 g | Freq: Four times a day (QID) | TRANSDERMAL | 2 refills | Status: DC

## 2018-01-04 MED ORDER — DICYCLOMINE HCL 20 MG PO TABS: 20.0000 mg | ORAL_TABLET | Freq: Four times a day (QID) | ORAL | 5 refills | Status: DC

## 2018-01-04 MED ORDER — CEPHALEXIN 500 MG PO CAPS: 500.0000 mg | ORAL_CAPSULE | Freq: Four times a day (QID) | ORAL | 0 refills | Status: DC

## 2018-01-05 LAB — MICROALBUMIN / CREATININE URINE RATIO
Creatinine, Urine: 205.2 mg/dL
MICROALB/CREAT RATIO: 8.7 mg/g{creat} (ref 0.0–30.0)
MICROALBUM., U, RANDOM: 17.9 ug/mL

## 2018-01-05 LAB — CMP14+EGFR
ALT: 14 IU/L (ref 0–32)
AST: 15 IU/L (ref 0–40)
Albumin/Globulin Ratio: 1.4 (ref 1.2–2.2)
Albumin: 4.4 g/dL (ref 3.5–5.5)
Alkaline Phosphatase: 93 IU/L (ref 39–117)
BUN/Creatinine Ratio: 20 (ref 9–23)
BUN: 18 mg/dL (ref 6–24)
Bilirubin Total: 0.3 mg/dL (ref 0.0–1.2)
CO2: 28 mmol/L (ref 20–29)
CREATININE: 0.9 mg/dL (ref 0.57–1.00)
Calcium: 9.5 mg/dL (ref 8.7–10.2)
Chloride: 99 mmol/L (ref 96–106)
GFR calc Af Amer: 88 mL/min/{1.73_m2} (ref >59)
GFR calc non Af Amer: 76 mL/min/{1.73_m2} (ref >59)
GLOBULIN, TOTAL: 3.1 g/dL (ref 1.5–4.5)
GLUCOSE: 141 mg/dL — AB (ref 65–99)
Potassium: 4.5 mmol/L (ref 3.5–5.2)
SODIUM: 142 mmol/L (ref 134–144)
Total Protein: 7.5 g/dL (ref 6.0–8.5)

## 2018-01-05 LAB — CBC WITH DIFFERENTIAL/PLATELET
BASOS ABS: 0.1 10*3/uL (ref 0.0–0.2)
Basos: 1 %
EOS (ABSOLUTE): 0.2 10*3/uL (ref 0.0–0.4)
EOS: 2 %
Hematocrit: 43.4 % (ref 34.0–46.6)
Hemoglobin: 14.6 g/dL (ref 11.1–15.9)
IMMATURE GRANULOCYTES: 0 %
Immature Grans (Abs): 0 10*3/uL (ref 0.0–0.1)
Lymphocytes Absolute: 3.5 10*3/uL — ABNORMAL HIGH (ref 0.7–3.1)
Lymphs: 40 %
MCH: 28.3 pg (ref 26.6–33.0)
MCHC: 33.6 g/dL (ref 31.5–35.7)
MCV: 84 fL (ref 79–97)
MONOS ABS: 0.7 10*3/uL (ref 0.1–0.9)
Monocytes: 8 %
Neutrophils Absolute: 4.3 10*3/uL (ref 1.4–7.0)
Neutrophils: 49 %
PLATELETS: 379 10*3/uL (ref 150–450)
RBC: 5.15 x10E6/uL (ref 3.77–5.28)
RDW: 13.7 % (ref 12.3–15.4)
WBC: 8.7 10*3/uL (ref 3.4–10.8)

## 2018-01-05 LAB — LIPID PANEL
CHOLESTEROL TOTAL: 193 mg/dL (ref 100–199)
Chol/HDL Ratio: 6.2 ratio — ABNORMAL HIGH (ref 0.0–4.4)
HDL: 31 mg/dL — ABNORMAL LOW (ref >39)
LDL CALC: 123 mg/dL — AB (ref 0–99)
Triglycerides: 197 mg/dL — ABNORMAL HIGH (ref 0–149)
VLDL Cholesterol Cal: 39 mg/dL (ref 5–40)

## 2018-01-05 LAB — THYROID PANEL WITH TSH
Free Thyroxine Index: 2.3 (ref 1.2–4.9)
T3 Uptake Ratio: 24 % (ref 24–39)
T4, Total: 9.7 ug/dL (ref 4.5–12.0)
TSH: 3.35 u[IU]/mL (ref 0.450–4.500)

## 2018-01-06 ENCOUNTER — Ambulatory Visit: Payer: BLUE CROSS/BLUE SHIELD | Admitting: Physician Assistant

## 2018-01-10 NOTE — Progress Notes (Signed)
BP 105/73   Pulse 78   Temp 97.9 F (36.6 C) (Oral)   Ht '5\' 7"'$  (1.702 m)   Wt 242 lb (109.8 kg)   BMI 37.90 kg/m    Subjective:    Patient ID: Judith [name], female    DOB: Feb 26, 1971, 47 y.o.   MRN: [id number]  HPI: Judith [name] is a 47 y.o. female presenting on 01/04/2018 for Surgical clearance  Judith [name] comes in today for surgical clearance to have her left knee replaced.  Through her company's benefits she will be going to another state to have that performed and then will return here.  We will do the primary aftercare and continue her FMLA and disability claims.  Her current out of work situation will be 7/3 through 04/21/2018.  She reports that she is down several more pounds.  She is below the BMI that they requested.  And it appears to be doing very well overall with her diabetes and other conditions.  We will have labs drawn appropriately.  We have also performed a chest x-ray and EKG today and they are within normal limits.  Next, she does need her refill on her Voltaren gel.  She was given this in the past.  We did not have it on her current list.  It may have to have prior office performed.  So we will report at this time that she has tried multiple oral anti-inflammatories but they do hurt her stomach.  And these have included ibuprofen, Mobic, Voltaren.  She is able to use the gel appropriately.  She also does have limited amount of tramadol she uses for very severe episodes.  Past Medical History:  Diagnosis Date  . Asthma   . Bipolar 1 disorder ([place])   . Depression   . GERD (gastroesophageal reflux disease)   . Hyperlipemia   . Hypothyroid   . Migraines    Relevant past medical, surgical, family and social history reviewed and updated as indicated. Interim medical history since our last visit reviewed. Allergies and medications reviewed and updated. DATA REVIEWED: CHART IN EPIC  Family History reviewed for pertinent findings.  Review of Systems    Constitutional: Negative.  Negative for activity change, fatigue and fever.  HENT: Negative.   Eyes: Negative.   Respiratory: Negative.  Negative for cough.   Cardiovascular: Negative.  Negative for chest pain.  Gastrointestinal: Negative.  Negative for abdominal pain.  Endocrine: Negative.   Genitourinary: Negative.  Negative for dysuria.  Musculoskeletal: Positive for arthralgias, gait problem and joint swelling.  Skin: Negative.     Allergies as of 01/04/2018      Reactions   Diphenhydramine Other (See Comments)   Hair feels like it is crawling   Morphine Nausea And Vomiting   PCA PUMP N/V IV PUSH IN ER NO PROBLEM PER PT.   Prednisone Nausea And Vomiting   Erythromycin Hives   Sulfonamide Derivatives Other (See Comments)   Does not take due to family history      Medication List        Accurate as of 01/04/18 11:59 PM. Always use your most recent med list.          albuterol (2.5 MG/3ML) 0.083% nebulizer solution Commonly known as:  PROVENTIL Nebulize 1 vial three times daily   atorvastatin 10 MG tablet Commonly known as:  LIPITOR Take 1 tablet (10 mg total) by mouth daily.   budesonide-formoterol 160-4.5 MCG/ACT inhaler Commonly known as:  SYMBICORT  Inhale 2 puffs into the lungs 2 (two) times daily.   busPIRone 15 MG tablet Commonly known as:  BUSPAR Take 15 mg by mouth 3 (three) times daily.   cephALEXin 500 MG capsule Commonly known as:  KEFLEX Take 1 capsule (500 mg total) by mouth 4 (four) times daily.   cetirizine 10 MG tablet Commonly known as:  ZYRTEC Take 1 tablet (10 mg total) by mouth daily.   cloNIDine 0.2 MG tablet Commonly known as:  CATAPRES   diclofenac sodium 1 % Gel Commonly known as:  VOLTAREN Apply 4 g topically 4 (four) times daily.   dicyclomine 20 MG tablet Commonly known as:  BENTYL Take 1 tablet (20 mg total) by mouth every 6 (six) hours.   ibuprofen 800 MG tablet Commonly known as:  ADVIL,MOTRIN Take 1 tablet (800 mg  total) by mouth every 8 (eight) hours as needed.   lamoTRIgine 200 MG tablet Commonly known as:  LAMICTAL Take 1 tablet (200 mg total) by mouth daily.   levothyroxine 125 MCG tablet Commonly known as:  SYNTHROID, LEVOTHROID Take 1 tablet (125 mcg total) by mouth daily.   lisinopril 20 MG tablet Commonly known as:  PRINIVIL,ZESTRIL Take 1 tablet (20 mg total) by mouth daily.   montelukast 10 MG tablet Commonly known as:  SINGULAIR Take 1 tablet (10 mg total) by mouth at bedtime.   pantoprazole 40 MG tablet Commonly known as:  PROTONIX Take 1 tablet (40 mg total) by mouth 2 (two) times daily.   pioglitazone 15 MG tablet Commonly known as:  ACTOS Take 1 tablet (15 mg total) by mouth daily.   propranolol 20 MG tablet Commonly known as:  INDERAL   sertraline 25 MG tablet Commonly known as:  ZOLOFT Take 25 mg by mouth daily.   sertraline 100 MG tablet Commonly known as:  ZOLOFT Take 100 mg by mouth daily.   SUMAtriptan Succinate 4 MG/0.5ML Soct Inject 4 mg into the skin once.   tiZANidine 4 MG tablet Commonly known as:  ZANAFLEX Take 4 mg by mouth every 6 (six) hours as needed for muscle spasms.   traMADol 50 MG tablet Commonly known as:  ULTRAM Take 1 tablet (50 mg total) by mouth every 8 (eight) hours as needed for severe pain.   VYVANSE PO Take by mouth.            Durable Medical Equipment  (From admission, onward)         Start     Ordered   01/04/18 0000  For home use only DME 4 wheeled rolling walker with seat ([id number])    Comments:  M17.12 knee osteoarthritis  Question Answer Comment  Patient needs a walker to treat with the following condition Aftercare following joint replacement   Patient needs a walker to treat with the following condition Osteoarthritis of both knees      01/04/18 1159             Objective:    BP 105/73   Pulse 78   Temp 97.9 F (36.6 C) (Oral)   Ht '5\' 7"'$  (1.702 m)   Wt 242 lb (109.8 kg)   BMI 37.90 kg/m     Allergies  Allergen Reactions  . Diphenhydramine Other (See Comments)    Hair feels like it is crawling  . Morphine Nausea And Vomiting    PCA PUMP N/V IV PUSH IN ER NO PROBLEM PER PT.  . Prednisone Nausea And Vomiting  . Erythromycin Hives  .  Sulfonamide Derivatives Other (See Comments)    Does not take due to family history    Wt Readings from Last 3 Encounters:  01/04/18 242 lb (109.8 kg)  11/17/17 250 lb 2 oz (113.5 kg)  10/14/17 257 lb 12.8 oz (116.9 kg)    Physical Exam  Constitutional: She is oriented to person, place, and time. She appears well-developed and well-nourished.  HENT:  Head: Normocephalic and atraumatic.  Right Ear: Tympanic membrane, external ear and ear canal normal.  Left Ear: Tympanic membrane, external ear and ear canal normal.  Nose: Nose normal. No rhinorrhea.  Mouth/Throat: Oropharynx is clear and moist and mucous membranes are normal. No oropharyngeal exudate or posterior oropharyngeal erythema.  Eyes: Pupils are equal, round, and reactive to light. Conjunctivae and EOM are normal.  Neck: Normal range of motion. Neck supple.  Cardiovascular: Normal rate, regular rhythm, normal heart sounds and intact distal pulses.  Pulmonary/Chest: Effort normal and breath sounds normal.  Abdominal: Soft. Bowel sounds are normal.  Neurological: She is alert and oriented to person, place, and time. She has normal reflexes.  Skin: Skin is warm and dry. No rash noted.  Psychiatric: She has a normal mood and affect. Her behavior is normal. Judgment and thought content normal.    Results for orders placed or performed in visit on 01/04/18  Microalbumin / creatinine urine ratio  Result Value Ref Range   Creatinine, Urine 205.2 Not Estab. mg/dL   Microalbumin, Urine 17.9 Not Estab. ug/mL   Microalb/Creat Ratio 8.7 0.0 - 30.0 mg/g creat  Bayer DCA Hb A1c Waived  Result Value Ref Range   HB A1C (BAYER DCA - WAIVED) 6.9 <7.0 %  Thyroid Panel With TSH  Result  Value Ref Range   TSH 3.350 0.450 - 4.500 uIU/mL   T4, Total 9.7 4.5 - 12.0 ug/dL   T3 Uptake Ratio 24 24 - 39 %   Free Thyroxine Index 2.3 1.2 - 4.9  Lipid panel  Result Value Ref Range   Cholesterol, Total 193 100 - 199 mg/dL   Triglycerides 197 (H) 0 - 149 mg/dL   HDL 31 (L) >39 mg/dL   VLDL Cholesterol Cal 39 5 - 40 mg/dL   LDL Calculated 123 (H) 0 - 99 mg/dL   Chol/HDL Ratio 6.2 (H) 0.0 - 4.4 ratio  CMP14+EGFR  Result Value Ref Range   Glucose 141 (H) 65 - 99 mg/dL   BUN 18 6 - 24 mg/dL   Creatinine, Ser 0.90 0.57 - 1.00 mg/dL   GFR calc non Af Amer 76 >59 mL/min/1.73   GFR calc Af Amer 88 >59 mL/min/1.73   BUN/Creatinine Ratio 20 9 - 23   Sodium 142 134 - 144 mmol/L   Potassium 4.5 3.5 - 5.2 mmol/L   Chloride 99 96 - 106 mmol/L   CO2 28 20 - 29 mmol/L   Calcium 9.5 8.7 - 10.2 mg/dL   Total Protein 7.5 6.0 - 8.5 g/dL   Albumin 4.4 3.5 - 5.5 g/dL   Globulin, Total 3.1 1.5 - 4.5 g/dL   Albumin/Globulin Ratio 1.4 1.2 - 2.2   Bilirubin Total 0.3 0.0 - 1.2 mg/dL   Alkaline Phosphatase 93 39 - 117 IU/L   AST 15 0 - 40 IU/L   ALT 14 0 - 32 IU/L  CBC with Differential/Platelet  Result Value Ref Range   WBC 8.7 3.4 - 10.8 x10E3/uL   RBC 5.15 3.77 - 5.28 x10E6/uL   Hemoglobin 14.6 11.1 - 15.9 g/dL  Hematocrit 43.4 34.0 - 46.6 %   MCV 84 79 - 97 fL   MCH 28.3 26.6 - 33.0 pg   MCHC 33.6 31.5 - 35.7 g/dL   RDW 13.7 12.3 - 15.4 %   Platelets 379 150 - 450 x10E3/uL   Neutrophils 49 Not Estab. %   Lymphs 40 Not Estab. %   Monocytes 8 Not Estab. %   Eos 2 Not Estab. %   Basos 1 Not Estab. %   Neutrophils Absolute 4.3 1.4 - 7.0 x10E3/uL   Lymphocytes Absolute 3.5 (H) 0.7 - 3.1 x10E3/uL   Monocytes Absolute 0.7 0.1 - 0.9 x10E3/uL   EOS (ABSOLUTE) 0.2 0.0 - 0.4 x10E3/uL   Basophils Absolute 0.1 0.0 - 0.2 x10E3/uL   Immature Granulocytes 0 Not Estab. %   Immature Grans (Abs) 0.0 0.0 - 0.1 x10E3/uL      Assessment & Plan:   1. Type 2 diabetes mellitus without  complication, without long-term current use of insulin (HCC) - EKG 12-Lead - Microalbumin / creatinine urine ratio - Bayer DCA Hb A1c Waived - Lipid panel - CMP14+EGFR - CBC with Differential/Platelet  2. Pre-op testing - EKG 12-Lead - DG Chest 2 View; Future - For home use only DME 4 wheeled rolling walker with seat ([id number]) - Microalbumin / creatinine urine ratio - Bayer DCA Hb A1c Waived - Thyroid Panel With TSH - Lipid panel - CMP14+EGFR - CBC with Differential/Platelet  At this time the patient is cleared medically for her upcoming surgery.  Notes, labs, EKG, radiology will be sent to her specialist.  3. Primary osteoarthritis of left knee - For home use only DME 4 wheeled rolling walker with seat ([id number])  4. Primary osteoarthritis of right knee - For home use only DME 4 wheeled rolling walker with seat ([id number])  5. Acquired hypothyroidism - Thyroid Panel With TSH   Continue all other maintenance medications as listed above.  Follow up plan: No follow-ups on file.  Educational handout given for [place] PA-C [facility] [address]  [place], [place] [address] [phone]   01/10/2018, 2:57 PM

## 2018-01-12 ENCOUNTER — Other Ambulatory Visit (HOSPITAL_COMMUNITY)
Admission: RE | Admit: 2018-01-12 | Discharge: 2018-01-12 | Disposition: A | Discharge status: Home / Self Care | Payer: BLUE CROSS/BLUE SHIELD | Source: Other Acute Inpatient Hospital | Attending: Physician Assistant | Admitting: Physician Assistant

## 2018-01-12 ENCOUNTER — Telehealth: Payer: Self-pay | Admitting: Physician Assistant

## 2018-01-12 DIAGNOSIS — Z831 Family history of other infectious and parasitic diseases: Secondary | ICD-10-CM | POA: Insufficient documentation

## 2018-01-12 LAB — MRSA PCR SCREENING: MRSA by PCR: NEGATIVE

## 2018-01-12 NOTE — Telephone Encounter (Signed)
Note is on her lab result

## 2018-01-12 NOTE — Telephone Encounter (Signed)
Spoke with patient.

## 2018-01-19 DIAGNOSIS — E785 Hyperlipidemia, unspecified: Secondary | ICD-10-CM | POA: Insufficient documentation

## 2018-01-19 DIAGNOSIS — Z8614 Personal history of Methicillin resistant Staphylococcus aureus infection: Secondary | ICD-10-CM | POA: Insufficient documentation

## 2018-01-19 DIAGNOSIS — K589 Irritable bowel syndrome without diarrhea: Secondary | ICD-10-CM | POA: Insufficient documentation

## 2018-01-19 DIAGNOSIS — J309 Allergic rhinitis, unspecified: Secondary | ICD-10-CM | POA: Insufficient documentation

## 2018-01-27 ENCOUNTER — Other Ambulatory Visit: Payer: Self-pay | Admitting: *Deleted

## 2018-01-27 MED ORDER — MONTELUKAST SODIUM 10 MG PO TABS: 10.0000 mg | ORAL_TABLET | Freq: Every day | ORAL | 0 refills | Status: DC

## 2018-01-27 NOTE — Telephone Encounter (Signed)
Fax received [facility], [place] 90 day on Montelukast 1 Rx for this sent, pt is having surgery there.

## 2018-01-31 ENCOUNTER — Ambulatory Visit (INDEPENDENT_AMBULATORY_CARE_PROVIDER_SITE_OTHER): Payer: BLUE CROSS/BLUE SHIELD | Admitting: Rehabilitative and Restorative Service Providers"

## 2018-01-31 ENCOUNTER — Encounter: Payer: Self-pay | Admitting: Rehabilitative and Restorative Service Providers"

## 2018-01-31 DIAGNOSIS — R2689 Other abnormalities of gait and mobility: Secondary | ICD-10-CM | POA: Diagnosis not present

## 2018-01-31 DIAGNOSIS — M6281 Muscle weakness (generalized): Secondary | ICD-10-CM

## 2018-01-31 DIAGNOSIS — R600 Localized edema: Secondary | ICD-10-CM | POA: Diagnosis not present

## 2018-01-31 DIAGNOSIS — M25562 Pain in left knee: Secondary | ICD-10-CM | POA: Diagnosis not present

## 2018-01-31 NOTE — Patient Instructions (Signed)
Quad Set    With other leg bent, foot flat, slowly tighten muscles on thigh of straight leg while counting out loud to _10___. Repeat with other leg. Repeat _10___ times. Do __2__ sessions per day.  Bracing With Heel Slides (Supine)    With neutral spine, tighten pelvic floor and abdominals and hold. Alternating legs, slide heel to bottom. Repeat _5-10__ times. Do _2-3_ times a day.  Also bend knee in sitting   Work on walking with straight posture; allowing knee to bend when you bring the left leg forward   Ice several times/day

## 2018-01-31 NOTE — Addendum Note (Signed)
Addended by: Everardo All on: 01/31/2018 01:47 PM   Modules accepted: Orders

## 2018-01-31 NOTE — Therapy (Signed)
[facility]  [place], [place], [address] Phone: [phone]   Fax:  [phone]  Physical Therapy Evaluation  Patient Details  Name: Judith [name] MRN: [id number] Date of Birth: 12-Jul-1970 Referring Provider: Dr Herbie Baltimore jones/Jeffrey Shaune Leeks PA-C    Encounter Date: 01/31/2018  PT End of Session - 01/31/18 0914    Visit Number  1    Number of Visits  18    Date for PT Re-Evaluation  03/14/18    PT Start Time  0845    PT Stop Time  0949    PT Time Calculation (min)  64 min    Activity Tolerance  Patient tolerated treatment well       Past Medical History:  Diagnosis Date  . Asthma   . Bipolar 1 disorder ([place])   . Depression   . GERD (gastroesophageal reflux disease)   . Hyperlipemia   . Hypothyroid   . Migraines     Past Surgical History:  Procedure Laterality Date  . ABDOMINAL SURGERY    . CARPAL TUNNEL RELEASE Bilateral 1999  . CESAREAN SECTION    . CHOLECYSTECTOMY    . THYROIDECTOMY    . TONSILLECTOMY      There were no vitals filed for this visit.   Subjective Assessment - 01/31/18 0855    Subjective  Patient reports longstanding history of bilat knee pain with multiple proceedures for bilat knees including injections and arthroscopic surgery. She has had knee pain for the past 20 years with significant increase in symptoms in the past 2 years. She underwent Lt TKA 01/20/18     Pertinent History  thyroid removed; gall bladder removed; arthritis; c-section; carla tunnel release bilat; hx of anxiety/depression; female surgery    Diagnostic tests  MRI xrays     Patient Stated Goals  get leg moving and improve quality of life          [facility] PT Assessment - 01/31/18 0001      Assessment   Medical Diagnosis  Lt TKA    Referring Provider  Dr Herbie Baltimore jones/Jeffrey Shaune Leeks PA-C     Onset Date/Surgical Date  01/20/18    Hand Dominance  Right    Next MD Visit  02/07/18  Particia Nearing PA-C - [facility] for follow up locally)     Prior Therapy  yes       Precautions   Precautions  None      Restrictions   Weight Bearing Restrictions  Yes    Other Position/Activity Restrictions  --   standing/walking 5 min/hour      Balance Screen   Has the patient fallen in the past 6 months  No    Has the patient had a decrease in activity level because of a fear of falling?   No    Is the patient reluctant to leave their home because of a fear of falling?   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; standing; climbing ladder 40hr/wk - 7 yrs     Leisure  household chores; shopping; caring for disabled aunt       Observation/Other Assessments  Skin Integrity  bruising/discoloration lateral calf     Focus on Therapeutic Outcomes (FOTO)   69% limitation       Observation/Other Assessments-Edema    Edema  --   mild edema Lt knee      Sensation   Additional Comments  WFL's       Posture/Postural Control   Posture Comments  head forward shoulders rounded       AROM   Right/Left Hip  --   WFL's bilat    Right Knee Extension  0    Right Knee Flexion  120    Left Knee Extension  -6    Left Knee Flexion  98    Right/Left Ankle  --   WFL's bilat      Strength   Right/Left Hip  --   Rt 5/5 hip    Left Hip Flexion  4+/5    Left Hip Extension  4/5    Left Hip ABduction  4/5    Right Knee Flexion  5/5    Right Knee Extension  5/5    Right/Left Ankle  --   5/5 bilat      Flexibility   Hamstrings  tight Lt > Rt    ITB  tight Lt > Rt       Ambulation/Gait   Gait Comments  ambulates with rolling walker limp Lt LE with wt bearing Lt                 Objective measurements completed on examination:  See above findings.      [facility] Treatment/Exercise - 01/31/18 0001      Knee/Hip Exercises: Aerobic   Nustep  L5 x 5 min UE (12)       Knee/Hip Exercises: Seated   Heel Slides  AROM;Left;5 reps      Knee/Hip Exercises: Supine   Quad Sets  AROM;Strengthening;Left;10 reps   5 sec hold  heel on foam roll    Knee Flexion  AROM;Left;5 reps   heel slide      Cryotherapy   Number Minutes Cryotherapy  15 Minutes    Cryotherapy Location  Knee   Lt    Type of Cryotherapy  Ice pack             PT Education - 01/31/18 0935    Education Details  HEP POC     Person(s) Educated  Patient    Methods  Explanation;Demonstration;Tactile cues;Verbal cues;Handout    Comprehension  Verbalized understanding;Returned demonstration;Verbal cues required;Tactile cues required          PT Long Term Goals - 01/31/18 0935      PT LONG TERM GOAL #1   Title  Increase ROM Lt knee to 0 deg extensin - 120 deg flexion 03/14/18    Time  6    Period  Weeks    Status  New      PT LONG TERM GOAL #2   Title  5/5 strength Lt LE 03/14/18    Time  6    Period  Weeks    Status  New      PT LONG TERM GOAL #3   Title  Patient tolerating 30 min of standing and or walking; stepping up onto ladder 1-2 rungs to prepare for RTW 03/14/18    Time  6    Period  Weeks    Status  New      PT LONG TERM GOAL #4   Title  Independent in HEP 03/14/18    Time  6    Period  Weeks    Status  New      PT LONG TERM GOAL #5   Title  Improve FOTO to </= 51% limitation 03/14/18    Time  6    Period  Weeks    Status  New             Plan - 01/31/18 0916    Clinical Impression Statement  Patient presents s/p Lt TKA 01/20/18. She has limited ROM; strength; abnormal gait; limited functional activities. She will benefit from PT to address problems identified.     History and Personal Factors relevant to plan of care:  arthritis bilat knees    Clinical Presentation  Stable    Clinical Decision Making   Low    Rehab Potential  Good    PT Frequency  3x / week    PT Duration  6 weeks    PT Treatment/Interventions  Patient/family education;ADLs/Self Care Home Management;Cryotherapy;Electrical Stimulation;Iontophoresis 4mg /ml Dexamethasone;Moist Heat;Dry needling;Manual techniques;Neuromuscular re-education    PT Next Visit Plan  ROM/stretching/strengthening/gait training Lt knee     Consulted and Agree with Plan of Care  Patient       Patient will benefit from skilled therapeutic intervention in order to improve the following deficits and impairments:  Pain, Decreased mobility, Decreased range of motion, Decreased strength, Abnormal gait, Decreased activity tolerance  Visit Diagnosis: Acute pain of left knee  Muscle weakness (generalized)  Other abnormalities of gait and mobility  Localized edema     Problem List Patient Active Problem List   Diagnosis Date Noted  . Morbid obesity ([place]) 11/21/2017  . Primary osteoarthritis of left knee 09/22/2017  . Primary osteoarthritis of right knee 09/22/2017  . Attention deficit hyperactivity disorder (ADHD) 05/12/2016  . Episodic mood disorder ([place]) 05/12/2016  . Type 2 diabetes mellitus without complication, without long-term current use of insulin ([place]) 05/12/2016  . Gastroesophageal reflux disease without esophagitis 05/12/2016  . DYSPNEA 07/22/2010  . CHEST PAIN 07/22/2010  . Hypothyroidism 09/13/2007  . Hyperlipidemia 09/13/2007  . ANXIETY DEPRESSION 09/13/2007  . MIGRAINE HEADACHE 09/13/2007  . Essential hypertension 09/13/2007  . ASTHMA 09/13/2007  . CHOLELITHIASIS, HX OF 09/13/2007  . NEPHROLITHIASIS, HX OF 09/13/2007  . CARPAL TUNNEL RELEASE, HX OF 09/13/2007    Rashawd Laskaris Nilda Simmer PT, MPH  01/31/2018, 12:53 PM  [facility] [place], [place], [address] Phone: [phone]   Fax:  [phone]  Name: Judith [name] MRN: [id number] Date of Birth:  August 26, 1970

## 2018-02-01 ENCOUNTER — Other Ambulatory Visit: Payer: Self-pay | Admitting: Physician Assistant

## 2018-02-01 ENCOUNTER — Encounter: Payer: BLUE CROSS/BLUE SHIELD | Admitting: Rehabilitative and Restorative Service Providers"

## 2018-02-01 ENCOUNTER — Telehealth: Payer: Self-pay | Admitting: Physician Assistant

## 2018-02-01 MED ORDER — OXYCODONE HCL 5 MG PO TABS: 5.0000 mg | ORAL_TABLET | ORAL | 0 refills | Status: DC | PRN

## 2018-02-01 NOTE — Progress Notes (Signed)
oxyc

## 2018-02-01 NOTE — Telephone Encounter (Signed)
Patient aware.

## 2018-02-01 NOTE — Telephone Encounter (Signed)
Pain assessment: Cause of pain-arthritis left knee, total knee replacement Pain on scale of 1-10- 10 Frequency- What increases pain-activity What makes pain Better-rest, meds Acute sinusitis but no better is not been 1 day who was married Current medications- oxycodone 5 mg q 4 hours  Patient has been seen in our office more than once a month over the past 6 months related to this problem.  We had anticipated her having the surgery.  This refill will be sent to her pharmacy and we will see her in follow-up at her next appointment.   Was the [place] reviewed- yes  If yes were their any concerning findings? - no  Discussed with supervising physician, Dr. Lajuana Ripple about the situation

## 2018-02-03 ENCOUNTER — Encounter: Payer: Self-pay | Admitting: Physical Therapy

## 2018-02-03 ENCOUNTER — Ambulatory Visit (INDEPENDENT_AMBULATORY_CARE_PROVIDER_SITE_OTHER): Payer: BLUE CROSS/BLUE SHIELD | Admitting: Physical Therapy

## 2018-02-03 DIAGNOSIS — R600 Localized edema: Secondary | ICD-10-CM

## 2018-02-03 DIAGNOSIS — M6281 Muscle weakness (generalized): Secondary | ICD-10-CM

## 2018-02-03 DIAGNOSIS — R2689 Other abnormalities of gait and mobility: Secondary | ICD-10-CM | POA: Diagnosis not present

## 2018-02-03 DIAGNOSIS — M25562 Pain in left knee: Secondary | ICD-10-CM | POA: Diagnosis not present

## 2018-02-04 ENCOUNTER — Encounter: Payer: BLUE CROSS/BLUE SHIELD | Admitting: Physical Therapy

## 2018-02-04 DIAGNOSIS — E039 Hypothyroidism, unspecified: Secondary | ICD-10-CM | POA: Diagnosis not present

## 2018-02-04 DIAGNOSIS — R791 Abnormal coagulation profile: Secondary | ICD-10-CM | POA: Diagnosis not present

## 2018-02-04 DIAGNOSIS — Z79899 Other long term (current) drug therapy: Secondary | ICD-10-CM | POA: Diagnosis not present

## 2018-02-04 DIAGNOSIS — F319 Bipolar disorder, unspecified: Secondary | ICD-10-CM | POA: Diagnosis not present

## 2018-02-04 DIAGNOSIS — J811 Chronic pulmonary edema: Secondary | ICD-10-CM | POA: Diagnosis not present

## 2018-02-04 DIAGNOSIS — Z882 Allergy status to sulfonamides status: Secondary | ICD-10-CM | POA: Diagnosis not present

## 2018-02-04 DIAGNOSIS — Z96652 Presence of left artificial knee joint: Secondary | ICD-10-CM | POA: Diagnosis not present

## 2018-02-04 DIAGNOSIS — R000 Tachycardia, unspecified: Secondary | ICD-10-CM | POA: Diagnosis not present

## 2018-02-04 DIAGNOSIS — N12 Tubulo-interstitial nephritis, not specified as acute or chronic: Secondary | ICD-10-CM | POA: Insufficient documentation

## 2018-02-04 DIAGNOSIS — A419 Sepsis, unspecified organism: Secondary | ICD-10-CM | POA: Diagnosis not present

## 2018-02-04 DIAGNOSIS — R6520 Severe sepsis without septic shock: Secondary | ICD-10-CM | POA: Diagnosis not present

## 2018-02-04 DIAGNOSIS — J4540 Moderate persistent asthma, uncomplicated: Secondary | ICD-10-CM | POA: Diagnosis not present

## 2018-02-04 DIAGNOSIS — B9620 Unspecified Escherichia coli [E. coli] as the cause of diseases classified elsewhere: Secondary | ICD-10-CM | POA: Diagnosis not present

## 2018-02-04 DIAGNOSIS — R9341 Abnormal radiologic findings on diagnostic imaging of renal pelvis, ureter, or bladder: Secondary | ICD-10-CM | POA: Diagnosis not present

## 2018-02-04 DIAGNOSIS — R918 Other nonspecific abnormal finding of lung field: Secondary | ICD-10-CM | POA: Diagnosis not present

## 2018-02-04 DIAGNOSIS — Z888 Allergy status to other drugs, medicaments and biological substances status: Secondary | ICD-10-CM | POA: Diagnosis not present

## 2018-02-04 DIAGNOSIS — R0602 Shortness of breath: Secondary | ICD-10-CM | POA: Diagnosis not present

## 2018-02-04 DIAGNOSIS — Z7984 Long term (current) use of oral hypoglycemic drugs: Secondary | ICD-10-CM | POA: Diagnosis not present

## 2018-02-04 DIAGNOSIS — N10 Acute tubulo-interstitial nephritis: Secondary | ICD-10-CM | POA: Diagnosis not present

## 2018-02-04 DIAGNOSIS — Z9049 Acquired absence of other specified parts of digestive tract: Secondary | ICD-10-CM | POA: Diagnosis not present

## 2018-02-04 DIAGNOSIS — E119 Type 2 diabetes mellitus without complications: Secondary | ICD-10-CM | POA: Diagnosis not present

## 2018-02-04 DIAGNOSIS — Z885 Allergy status to narcotic agent status: Secondary | ICD-10-CM | POA: Diagnosis not present

## 2018-02-04 DIAGNOSIS — I10 Essential (primary) hypertension: Secondary | ICD-10-CM | POA: Diagnosis not present

## 2018-02-04 DIAGNOSIS — J9811 Atelectasis: Secondary | ICD-10-CM | POA: Diagnosis not present

## 2018-02-04 DIAGNOSIS — A4151 Sepsis due to Escherichia coli [E. coli]: Secondary | ICD-10-CM | POA: Diagnosis not present

## 2018-02-04 DIAGNOSIS — R079 Chest pain, unspecified: Secondary | ICD-10-CM | POA: Diagnosis not present

## 2018-02-04 DIAGNOSIS — Z87891 Personal history of nicotine dependence: Secondary | ICD-10-CM | POA: Diagnosis not present

## 2018-02-04 NOTE — Therapy (Signed)
[place], [place], [address] Phone: [phone]   Fax:  [phone]  Physical Therapy Treatment  Patient Details  Name: Judith [name] MRN: [id number] Date of Birth: Nov 12, 1970 Referring Provider: Dr Herbie Baltimore jones/Jeffrey Shaune Leeks PA-C    Encounter Date: 02/03/2018  PT End of Session - 02/03/18 1609    Visit Number  2    Number of Visits  18    Date for PT Re-Evaluation  03/14/18    PT Start Time  1532    PT Stop Time  1620    PT Time Calculation (min)  48 min    Activity Tolerance  Patient tolerated treatment well    Behavior During Therapy  [facility] for tasks assessed/performed       Past Medical History:  Diagnosis Date  . Asthma   . Bipolar 1 disorder ([place])   . Depression   . GERD (gastroesophageal reflux disease)   . Hyperlipemia   . Hypothyroid   . Migraines     Past Surgical History:  Procedure Laterality Date  . ABDOMINAL SURGERY    . CARPAL TUNNEL RELEASE Bilateral 1999  . CESAREAN SECTION    . CHOLECYSTECTOMY    . THYROIDECTOMY    . TONSILLECTOMY      There were no vitals filed for this visit.  Subjective Assessment - 02/03/18 1537    Subjective  doing well; knee is still a little tight, but otherwise moving well.    Patient Stated Goals  get leg moving and improve quality of life     Currently in Pain?  Yes    Pain Score  5     Pain Location  Knee    Pain Orientation  Left    Pain Descriptors / Indicators  Sore                       [facility] Adult PT Treatment/Exercise - 02/03/18 1539      Ambulation/Gait   Gait Comments  gait training with SPC: mod cues needed for sequencing      Exercises   Exercises  Knee/Hip      Knee/Hip Exercises: Aerobic   Recumbent Bike  L1 x 5 min      Knee/Hip Exercises: Supine   Quad Sets  Left;20 reps   long sitting for visual feedback   Short Arc Quad Sets  Left;20 reps    Short Arc Quad Sets Limitations   2#; 5 sec hold    Straight Leg Raises  Left;20 reps    Straight Leg Raises Limitations  2#    Other Supine Knee/Hip Exercises  ball roll hamstring curls; trunk rotation x 15 reps bil      Modalities   Modalities  Vasopneumatic      Vasopneumatic   Number Minutes Vasopneumatic   15 minutes    Vasopnuematic Location   Knee    Vasopneumatic Pressure  Medium    Vasopneumatic Temperature   max cold                  PT Long Term Goals - 01/31/18 0935      PT LONG TERM GOAL #1   Title  Increase ROM Lt knee to 0 deg extensin - 120 deg flexion 03/14/18    Time  6    Period  Weeks    Status  New      PT LONG TERM GOAL #2  Title  5/5 strength Lt LE 03/14/18    Time  6    Period  Weeks    Status  New      PT LONG TERM GOAL #3   Title  Patient tolerating 30 min of standing and or walking; stepping up onto ladder 1-2 rungs to prepare for RTW 03/14/18    Time  6    Period  Weeks    Status  New      PT LONG TERM GOAL #4   Title  Independent in HEP 03/14/18    Time  6    Period  Weeks    Status  New      PT LONG TERM GOAL #5   Title  Improve FOTO to </= 51% limitation 03/14/18    Time  6    Period  Weeks    Status  New            Plan - 02/04/18 2035    Clinical Impression Statement  Pt tolerated session well with improved quad control with cues to decrease extensor lag.  Needs mod cues to sequence with SPC but progressing well with PT.  Will continue to benefit from PT to maximize function.    Rehab Potential  Good    PT Frequency  3x / week    PT Duration  6 weeks    PT Treatment/Interventions  Patient/family education;ADLs/Self Care Home Management;Cryotherapy;Electrical Stimulation;Iontophoresis 4mg /ml Dexamethasone;Moist Heat;Dry needling;Manual techniques;Neuromuscular re-education    PT Next Visit Plan  ROM/stretching/strengthening/gait training Lt knee     Consulted and Agree with Plan of Care  Patient       Patient will benefit from skilled  therapeutic intervention in order to improve the following deficits and impairments:  Pain, Decreased mobility, Decreased range of motion, Decreased strength, Abnormal gait, Decreased activity tolerance  Visit Diagnosis: Acute pain of left knee  Muscle weakness (generalized)  Other abnormalities of gait and mobility  Localized edema     Problem List Patient Active Problem List   Diagnosis Date Noted  . Morbid obesity ([place]) 11/21/2017  . Primary osteoarthritis of left knee 09/22/2017  . Primary osteoarthritis of right knee 09/22/2017  . Attention deficit hyperactivity disorder (ADHD) 05/12/2016  . Episodic mood disorder ([place]) 05/12/2016  . Type 2 diabetes mellitus without complication, without long-term current use of insulin ([place]) 05/12/2016  . Gastroesophageal reflux disease without esophagitis 05/12/2016  . DYSPNEA 07/22/2010  . CHEST PAIN 07/22/2010  . Hypothyroidism 09/13/2007  . Hyperlipidemia 09/13/2007  . ANXIETY DEPRESSION 09/13/2007  . MIGRAINE HEADACHE 09/13/2007  . Essential hypertension 09/13/2007  . ASTHMA 09/13/2007  . CHOLELITHIASIS, HX OF 09/13/2007  . NEPHROLITHIASIS, HX OF 09/13/2007  . CARPAL TUNNEL RELEASE, HX OF 09/13/2007      Laureen Abrahams, PT, DPT 02/04/18 7:50 AM    [facility] [place], [place], [address] Phone: [phone]   Fax:  [phone]  Name: Judith [name] MRN: [id number] Date of Birth: [birth year]

## 2018-02-05 DIAGNOSIS — F319 Bipolar disorder, unspecified: Secondary | ICD-10-CM | POA: Diagnosis not present

## 2018-02-05 DIAGNOSIS — N12 Tubulo-interstitial nephritis, not specified as acute or chronic: Secondary | ICD-10-CM | POA: Diagnosis not present

## 2018-02-05 DIAGNOSIS — A4151 Sepsis due to Escherichia coli [E. coli]: Secondary | ICD-10-CM | POA: Diagnosis not present

## 2018-02-05 DIAGNOSIS — E039 Hypothyroidism, unspecified: Secondary | ICD-10-CM | POA: Diagnosis not present

## 2018-02-06 DIAGNOSIS — E039 Hypothyroidism, unspecified: Secondary | ICD-10-CM | POA: Diagnosis not present

## 2018-02-06 DIAGNOSIS — R918 Other nonspecific abnormal finding of lung field: Secondary | ICD-10-CM | POA: Diagnosis not present

## 2018-02-06 DIAGNOSIS — J9811 Atelectasis: Secondary | ICD-10-CM | POA: Diagnosis not present

## 2018-02-06 DIAGNOSIS — N12 Tubulo-interstitial nephritis, not specified as acute or chronic: Secondary | ICD-10-CM | POA: Diagnosis not present

## 2018-02-06 DIAGNOSIS — R079 Chest pain, unspecified: Secondary | ICD-10-CM | POA: Diagnosis not present

## 2018-02-06 DIAGNOSIS — A4151 Sepsis due to Escherichia coli [E. coli]: Secondary | ICD-10-CM | POA: Diagnosis not present

## 2018-02-06 DIAGNOSIS — J811 Chronic pulmonary edema: Secondary | ICD-10-CM | POA: Diagnosis not present

## 2018-02-06 DIAGNOSIS — F319 Bipolar disorder, unspecified: Secondary | ICD-10-CM | POA: Diagnosis not present

## 2018-02-07 ENCOUNTER — Ambulatory Visit: Payer: BLUE CROSS/BLUE SHIELD | Admitting: Physician Assistant

## 2018-02-07 ENCOUNTER — Encounter: Payer: BLUE CROSS/BLUE SHIELD | Admitting: Physical Therapy

## 2018-02-07 DIAGNOSIS — A4151 Sepsis due to Escherichia coli [E. coli]: Secondary | ICD-10-CM | POA: Diagnosis not present

## 2018-02-07 DIAGNOSIS — F319 Bipolar disorder, unspecified: Secondary | ICD-10-CM | POA: Diagnosis not present

## 2018-02-07 DIAGNOSIS — N12 Tubulo-interstitial nephritis, not specified as acute or chronic: Secondary | ICD-10-CM | POA: Diagnosis not present

## 2018-02-07 DIAGNOSIS — E039 Hypothyroidism, unspecified: Secondary | ICD-10-CM | POA: Diagnosis not present

## 2018-02-08 DIAGNOSIS — A4151 Sepsis due to Escherichia coli [E. coli]: Secondary | ICD-10-CM | POA: Diagnosis not present

## 2018-02-08 DIAGNOSIS — N12 Tubulo-interstitial nephritis, not specified as acute or chronic: Secondary | ICD-10-CM | POA: Diagnosis not present

## 2018-02-08 DIAGNOSIS — E039 Hypothyroidism, unspecified: Secondary | ICD-10-CM | POA: Diagnosis not present

## 2018-02-08 DIAGNOSIS — F319 Bipolar disorder, unspecified: Secondary | ICD-10-CM | POA: Diagnosis not present

## 2018-02-09 ENCOUNTER — Encounter: Payer: BLUE CROSS/BLUE SHIELD | Admitting: Physical Therapy

## 2018-02-09 DIAGNOSIS — N12 Tubulo-interstitial nephritis, not specified as acute or chronic: Secondary | ICD-10-CM | POA: Diagnosis not present

## 2018-02-09 DIAGNOSIS — F319 Bipolar disorder, unspecified: Secondary | ICD-10-CM | POA: Diagnosis not present

## 2018-02-09 DIAGNOSIS — A4151 Sepsis due to Escherichia coli [E. coli]: Secondary | ICD-10-CM | POA: Diagnosis not present

## 2018-02-09 DIAGNOSIS — E039 Hypothyroidism, unspecified: Secondary | ICD-10-CM | POA: Diagnosis not present

## 2018-02-10 ENCOUNTER — Ambulatory Visit (INDEPENDENT_AMBULATORY_CARE_PROVIDER_SITE_OTHER): Payer: BLUE CROSS/BLUE SHIELD | Admitting: Physician Assistant

## 2018-02-10 ENCOUNTER — Encounter: Payer: Self-pay | Admitting: Physician Assistant

## 2018-02-10 VITALS — BP 119/77 | HR 96 | Temp 98.1°F | Ht 67.0 in | Wt 241.0 lb

## 2018-02-10 DIAGNOSIS — R0681 Apnea, not elsewhere classified: Secondary | ICD-10-CM | POA: Diagnosis not present

## 2018-02-10 DIAGNOSIS — M1712 Unilateral primary osteoarthritis, left knee: Secondary | ICD-10-CM | POA: Diagnosis not present

## 2018-02-10 DIAGNOSIS — Z96652 Presence of left artificial knee joint: Secondary | ICD-10-CM | POA: Diagnosis not present

## 2018-02-10 DIAGNOSIS — I10 Essential (primary) hypertension: Secondary | ICD-10-CM

## 2018-02-10 MED ORDER — GENERIC EXTERNAL MEDICATION
650.00 | Status: DC
Start: ? — End: 2018-02-10

## 2018-02-10 MED ORDER — LAMOTRIGINE 25 MG PO TABS
50.00 | ORAL_TABLET | ORAL | Status: DC
Start: 2018-02-09 — End: 2018-02-10

## 2018-02-10 MED ORDER — LEVOFLOXACIN 750 MG PO TABS
750.00 | ORAL_TABLET | ORAL | Status: DC
Start: 2018-02-10 — End: 2018-02-10

## 2018-02-10 MED ORDER — SERTRALINE HCL 100 MG PO TABS
200.00 | ORAL_TABLET | ORAL | Status: DC
Start: 2018-02-10 — End: 2018-02-10

## 2018-02-10 MED ORDER — OXYCODONE HCL 5 MG PO TABS
5.00 | ORAL_TABLET | ORAL | Status: DC
Start: ? — End: 2018-02-10

## 2018-02-10 MED ORDER — ALBUTEROL SULFATE (2.5 MG/3ML) 0.083% IN NEBU
2.50 | INHALATION_SOLUTION | RESPIRATORY_TRACT | Status: DC
Start: ? — End: 2018-02-10

## 2018-02-10 MED ORDER — ONDANSETRON 8 MG PO TBDP: 8.0000 mg | ORAL_TABLET | Freq: Three times a day (TID) | ORAL | 0 refills | Status: DC | PRN

## 2018-02-10 MED ORDER — POLYETHYLENE GLYCOL 3350 17 G PO PACK
17.00 | PACK | ORAL | Status: DC
Start: ? — End: 2018-02-10

## 2018-02-10 MED ORDER — TIZANIDINE HCL 4 MG PO TABS
2.00 | ORAL_TABLET | ORAL | Status: DC
Start: ? — End: 2018-02-10

## 2018-02-10 MED ORDER — PANTOPRAZOLE SODIUM 40 MG PO TBEC
40.00 | DELAYED_RELEASE_TABLET | ORAL | Status: DC
Start: 2018-02-09 — End: 2018-02-10

## 2018-02-10 MED ORDER — GENERIC EXTERNAL MEDICATION
10.00 | Status: DC
Start: ? — End: 2018-02-10

## 2018-02-10 MED ORDER — ACETAMINOPHEN 325 MG PO TABS
650.00 | ORAL_TABLET | ORAL | Status: DC
Start: ? — End: 2018-02-10

## 2018-02-10 MED ORDER — HYDROMORPHONE HCL 1 MG/ML IJ SOLN
1.00 | INTRAMUSCULAR | Status: DC
Start: ? — End: 2018-02-10

## 2018-02-10 MED ORDER — SODIUM CHLORIDE 0.9 % IV SOLN
10.00 | INTRAVENOUS | Status: DC
Start: ? — End: 2018-02-10

## 2018-02-10 MED ORDER — ALUM & MAG HYDROXIDE-SIMETH 200-200-20 MG/5ML PO SUSP
30.00 | ORAL | Status: DC
Start: ? — End: 2018-02-10

## 2018-02-10 MED ORDER — NITROGLYCERIN 0.4 MG SL SUBL
0.40 | SUBLINGUAL_TABLET | SUBLINGUAL | Status: DC
Start: ? — End: 2018-02-10

## 2018-02-10 MED ORDER — KETOROLAC TROMETHAMINE 15 MG/ML IJ SOLN
15.00 | INTRAMUSCULAR | Status: DC
Start: 2018-02-09 — End: 2018-02-10

## 2018-02-10 MED ORDER — GUAIFENESIN 100 MG/5ML PO SYRP
200.00 | ORAL_SOLUTION | ORAL | Status: DC
Start: ? — End: 2018-02-10

## 2018-02-10 MED ORDER — CALCIUM CARBONATE ANTACID 500 MG PO CHEW
500.00 | CHEWABLE_TABLET | ORAL | Status: DC
Start: ? — End: 2018-02-10

## 2018-02-10 MED ORDER — PROMETHAZINE HCL 25 MG/ML IJ SOLN
12.50 | INTRAMUSCULAR | Status: DC
Start: ? — End: 2018-02-10

## 2018-02-10 MED ORDER — MONTELUKAST SODIUM 10 MG PO TABS
10.00 | ORAL_TABLET | ORAL | Status: DC
Start: 2018-02-09 — End: 2018-02-10

## 2018-02-10 MED ORDER — LEVOTHYROXINE SODIUM 75 MCG PO TABS
75.00 | ORAL_TABLET | ORAL | Status: DC
Start: 2018-02-10 — End: 2018-02-10

## 2018-02-10 MED ORDER — HYDRALAZINE HCL 20 MG/ML IJ SOLN
10.00 | INTRAMUSCULAR | Status: DC
Start: ? — End: 2018-02-10

## 2018-02-10 MED ORDER — GENERIC EXTERNAL MEDICATION
4.00 | Status: DC
Start: ? — End: 2018-02-10

## 2018-02-10 MED ORDER — ENOXAPARIN SODIUM 40 MG/0.4ML ~~LOC~~ SOLN
40.00 | SUBCUTANEOUS | Status: DC
Start: 2018-02-09 — End: 2018-02-10

## 2018-02-10 MED ORDER — PSEUDOEPHEDRINE HCL ER 120 MG PO TB12
120.00 | ORAL_TABLET | ORAL | Status: DC
Start: 2018-02-09 — End: 2018-02-10

## 2018-02-10 NOTE — Patient Instructions (Signed)
In a few days you may receive a survey in the mail or online from [employer] regarding your visit with us today. Please take a moment to fill this out. Your feedback is very important to our whole office. It can help us better understand your needs as well as improve your experience and satisfaction. Thank you for taking your time to complete it. We care about you.  Annslee Tercero, PA-C  

## 2018-02-11 ENCOUNTER — Encounter: Payer: BLUE CROSS/BLUE SHIELD | Admitting: Physical Therapy

## 2018-02-14 ENCOUNTER — Ambulatory Visit (INDEPENDENT_AMBULATORY_CARE_PROVIDER_SITE_OTHER): Payer: BLUE CROSS/BLUE SHIELD | Admitting: Physical Therapy

## 2018-02-14 ENCOUNTER — Encounter: Payer: Self-pay | Admitting: Physical Therapy

## 2018-02-14 DIAGNOSIS — R2689 Other abnormalities of gait and mobility: Secondary | ICD-10-CM

## 2018-02-14 DIAGNOSIS — M6281 Muscle weakness (generalized): Secondary | ICD-10-CM | POA: Diagnosis not present

## 2018-02-14 DIAGNOSIS — M25562 Pain in left knee: Secondary | ICD-10-CM | POA: Diagnosis not present

## 2018-02-14 DIAGNOSIS — R600 Localized edema: Secondary | ICD-10-CM | POA: Diagnosis not present

## 2018-02-14 NOTE — Progress Notes (Signed)
BP 119/77   Pulse 96   Temp 98.1 F (36.7 C) (Oral)   Ht 5\' 7"  (1.702 m)   Wt 241 lb (109.3 kg)   BMI 37.75 kg/m    Subjective:    Patient ID: Judith [name], female    DOB: 1970/08/30, 47 y.o.   MRN: [id number]  HPI: Judith [name] is a 47 y.o. female presenting on 02/10/2018 for Hospitalization Follow-up  Patient comes in for hospital follow-up.  She had her knee replaced in a program in [place].  She has been doing fairly well.  When she came back to [place] she did have a UTI and she had to be hospitalized.  The notes have been reviewed.  She is feeling much better with this.  She continues with rehabilitation at a physical therapy office.  She has a very good range of motion at this time.  The wound is healing well.  She denies any fever or chills.  Overall she feels like she is progressing well.  It was of note when she had her surgery performed there were episodes of witnessed apnea.  I told her that she should have a follow-up done with a sleep specialist concerning this matter.  We will make the referral.  Overall she is continue with good lifestyle changes.  She is even lost a few more pounds.  Her hypertension and diabetes are well controlled.  Past Medical History:  Diagnosis Date  . Asthma   . Bipolar 1 disorder ([place])   . Depression   . GERD (gastroesophageal reflux disease)   . Hyperlipemia   . Hypothyroid   . Migraines    Relevant past medical, surgical, family and social history reviewed and updated as indicated. Interim medical history since our last visit reviewed. Allergies and medications reviewed and updated. DATA REVIEWED: CHART IN EPIC  Family History reviewed for pertinent findings.  Review of Systems  Constitutional: Negative.  Negative for activity change, fatigue and fever.  HENT: Negative.   Eyes: Negative.   Respiratory: Negative.  Negative for cough.   Cardiovascular: Negative.  Negative for chest pain.  Gastrointestinal: Negative.  Negative  for abdominal pain.  Endocrine: Negative.   Genitourinary: Negative.  Negative for dysuria.  Musculoskeletal: Positive for arthralgias and gait problem.  Skin: Negative.     Allergies as of 02/10/2018      Reactions   Diphenhydramine Other (See Comments)   Hair feels like it is crawling   Morphine Nausea And Vomiting   PCA PUMP N/V IV PUSH IN ER NO PROBLEM PER PT.   Prednisone Nausea And Vomiting   Erythromycin Hives   Sulfonamide Derivatives Other (See Comments)   Does not take due to family history      Medication List        Accurate as of 02/10/18 11:59 PM. Always use your most recent med list.          acetaminophen 325 MG tablet Commonly known as:  TYLENOL Take 650 mg by mouth every 6 (six) hours as needed.   albuterol (2.5 MG/3ML) 0.083% nebulizer solution Commonly known as:  PROVENTIL Nebulize 1 vial three times daily   aspirin EC 81 MG tablet Take 162 mg by mouth daily.   atorvastatin 10 MG tablet Commonly known as:  LIPITOR Take 1 tablet (10 mg total) by mouth daily.   budesonide-formoterol 160-4.5 MCG/ACT inhaler Commonly known as:  SYMBICORT Inhale 2 puffs into the lungs 2 (two) times daily.   busPIRone  15 MG tablet Commonly known as:  BUSPAR Take 15 mg by mouth 3 (three) times daily.   cetirizine 10 MG tablet Commonly known as:  ZYRTEC Take 1 tablet (10 mg total) by mouth daily.   cloNIDine 0.2 MG tablet Commonly known as:  CATAPRES   diclofenac sodium 1 % Gel Commonly known as:  VOLTAREN Apply 4 g topically 4 (four) times daily.   dicyclomine 20 MG tablet Commonly known as:  BENTYL Take 1 tablet (20 mg total) by mouth every 6 (six) hours.   ibuprofen 800 MG tablet Commonly known as:  ADVIL,MOTRIN Take 1 tablet (800 mg total) by mouth every 8 (eight) hours as needed.   lamoTRIgine 200 MG tablet Commonly known as:  LAMICTAL Take 1 tablet (200 mg total) by mouth daily.   levothyroxine 125 MCG tablet Commonly known as:  SYNTHROID,  LEVOTHROID Take 1 tablet (125 mcg total) by mouth daily.   lisinopril 20 MG tablet Commonly known as:  PRINIVIL,ZESTRIL Take 1 tablet (20 mg total) by mouth daily.   montelukast 10 MG tablet Commonly known as:  SINGULAIR Take 1 tablet (10 mg total) by mouth at bedtime.   ondansetron 8 MG disintegrating tablet Commonly known as:  ZOFRAN-ODT Take 1 tablet (8 mg total) by mouth every 8 (eight) hours as needed for nausea or vomiting.   pantoprazole 40 MG tablet Commonly known as:  PROTONIX Take 1 tablet (40 mg total) by mouth 2 (two) times daily.   pioglitazone 15 MG tablet Commonly known as:  ACTOS Take 1 tablet (15 mg total) by mouth daily.   propranolol 20 MG tablet Commonly known as:  INDERAL   sertraline 100 MG tablet Commonly known as:  ZOLOFT Take 200 mg by mouth daily.   SUMAtriptan Succinate 4 MG/0.5ML Soct Inject 4 mg into the skin once.   tiZANidine 4 MG tablet Commonly known as:  ZANAFLEX Take 4 mg by mouth every 6 (six) hours as needed for muscle spasms.   traMADol 50 MG tablet Commonly known as:  ULTRAM Take 1 tablet (50 mg total) by mouth every 8 (eight) hours as needed for severe pain.   VYVANSE PO Take by mouth.          Objective:    BP 119/77   Pulse 96   Temp 98.1 F (36.7 C) (Oral)   Ht 5\' 7"  (1.702 m)   Wt 241 lb (109.3 kg)   BMI 37.75 kg/m   Allergies  Allergen Reactions  . Diphenhydramine Other (See Comments)    Hair feels like it is crawling  . Morphine Nausea And Vomiting    PCA PUMP N/V IV PUSH IN ER NO PROBLEM PER PT.  . Prednisone Nausea And Vomiting  . Erythromycin Hives  . Sulfonamide Derivatives Other (See Comments)    Does not take due to family history    Wt Readings from Last 3 Encounters:  02/10/18 241 lb (109.3 kg)  01/04/18 242 lb (109.8 kg)  11/17/17 250 lb 2 oz (113.5 kg)    Physical Exam  Constitutional: She is oriented to person, place, and time. She appears well-developed and well-nourished.  HENT:    Head: Normocephalic and atraumatic.  Eyes: Pupils are equal, round, and reactive to light. Conjunctivae and EOM are normal.  Cardiovascular: Normal rate, regular rhythm, normal heart sounds and intact distal pulses.  Pulmonary/Chest: Effort normal and breath sounds normal.  Abdominal: Soft. Bowel sounds are normal.  Musculoskeletal:       Left knee: She  exhibits deformity. She exhibits no swelling and no erythema.       Legs: Surgical scar well healed  Neurological: She is alert and oriented to person, place, and time. She has normal reflexes.  Skin: Skin is warm and dry. No rash noted.  Psychiatric: She has a normal mood and affect. Her behavior is normal. Judgment and thought content normal.    Results for orders placed or performed during the hospital encounter of 01/12/18  MRSA PCR Screening  Result Value Ref Range   MRSA by PCR NEGATIVE NEGATIVE      Assessment & Plan:    1. Primary osteoarthritis of left knee continnue therapy  2. History of total left knee replacement Continue therapy  3. Essential hypertension Continue medication  4. Episode of apnea - Ambulatory referral to Sleep Studies   Continue all other maintenance medications as listed above.  Follow up plan: Return in about 6 weeks (around 03/24/2018) for recheck knee.  Educational handout given for [place] PA-C [place] [address]  [place], [place] [address] [phone]   02/14/2018, 12:57 PM
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
## 2018-02-14 NOTE — Patient Instructions (Addendum)
Tandem Stance    Right foot in front of left, heel touching toe both feet "straight ahead". Stand on Foot Triangle of Support with both feet. Balance in this position _15-30__ seconds- NEXT TO COUNTER. Balance: Unilateral    Attempt to balance on left leg, eyes open. Hold _15-30___ seconds. Repeat __2-3__ times per set. Do ___1_ sets per session. Do __1__ sessions per day. HIP: Hamstrings - Supine  Place strap around foot. Raise leg up, keeping knee straight.  Bend opposite knee to protect back if indicated. Hold 30 seconds. 3 reps per set, 2-3 sets per day  Calf Stretch    Place hands on wall at shoulder height. Keeping back leg straight, bend front leg, feet pointing forward, heels flat on floor. Lean forward slightly until stretch is felt in calf of back leg. Hold stretch _30__ seconds, breathing slowly in and out. Repeat stretch with other leg back. Do _2__ sessions per day.    [facility] [place], [place] [address]  [phone] (office) [phone] (fax)

## 2018-02-16 ENCOUNTER — Ambulatory Visit (INDEPENDENT_AMBULATORY_CARE_PROVIDER_SITE_OTHER): Payer: BLUE CROSS/BLUE SHIELD | Admitting: Physical Therapy

## 2018-02-16 ENCOUNTER — Encounter: Payer: Self-pay | Admitting: Physical Therapy

## 2018-02-16 DIAGNOSIS — M25562 Pain in left knee: Secondary | ICD-10-CM | POA: Diagnosis not present

## 2018-02-16 DIAGNOSIS — R2689 Other abnormalities of gait and mobility: Secondary | ICD-10-CM | POA: Diagnosis not present

## 2018-02-16 DIAGNOSIS — R600 Localized edema: Secondary | ICD-10-CM | POA: Diagnosis not present

## 2018-02-16 DIAGNOSIS — M6281 Muscle weakness (generalized): Secondary | ICD-10-CM

## 2018-02-16 NOTE — Therapy (Signed)
[place], [place], [address] Phone: [phone]   Fax:  [phone]  Physical Therapy Treatment  Patient Details  Name: Judith [name] MRN: [id number] Date of Birth: [birth year] Referring Provider (PT): Dr Herbie Baltimore Jones/Jeffrey Shaune Leeks PA-C    Encounter Date: 02/16/2018  PT End of Session - 02/16/18 1147    Visit Number  4    Number of Visits  18    Date for PT Re-Evaluation  03/14/18    PT Start Time  1106    PT Stop Time  1159    PT Time Calculation (min)  53 min    Activity Tolerance  Patient tolerated treatment well;No increased pain    Behavior During Therapy  WFL for tasks assessed/performed       Past Medical History:  Diagnosis Date  . Asthma   . Bipolar 1 disorder ([place])   . Depression   . GERD (gastroesophageal reflux disease)   . Hyperlipemia   . Hypothyroid   . Migraines     Past Surgical History:  Procedure Laterality Date  . ABDOMINAL SURGERY    . CARPAL TUNNEL RELEASE Bilateral 1999  . CESAREAN SECTION    . CHOLECYSTECTOMY    . THYROIDECTOMY    . TONSILLECTOMY      There were no vitals filed for this visit.  Subjective Assessment - 02/16/18 1114    Subjective  Pt reports she had been working on sit to/from stand from couch (soft and low) and her back spasmed.  "I could hardly stand up straight yesterday".   Things have eased off some.  She feels like she is not as weak as Monday.     Currently in Pain?  Yes    Pain Score  3     Pain Location  Back    Pain Orientation  Lower;Left;Right    Pain Descriptors / Indicators  Sore    Aggravating Factors   bending over    Pain Relieving Factors  heat          [facility] PT Assessment - 02/16/18 0001      Assessment   Medical Diagnosis  Lt TKA    Referring Provider (PT)  Dr Herbie Baltimore Jones/Jeffrey Shaune Leeks PA-C     Onset Date/Surgical Date  01/20/18    Hand Dominance  Right    Next MD Visit  around  03/21/18       [facility] Adult PT Treatment/Exercise - 02/16/18 0001      Knee/Hip Exercises: Stretches   Passive Hamstring Stretch  Left;3 reps;30 seconds   1 rep standing, 2 reps supine   Quad Stretch  Left;Right;2 reps;30 seconds   prone with strap   Gastroc Stretch  Left;Right;2 reps;30 seconds   runners stretch     Knee/Hip Exercises: Aerobic   Nustep  L4 x 6 min UE/LE    PTA present to discuss progress   Other Aerobic  lap around gym in between exercises to decrease stiffness.       Knee/Hip Exercises: Standing   Heel Raises  Both;1 set;10 reps    Terminal Knee Extension  Strengthening;Left;1 set;10 reps   ball behind knee, 10 sec hold    Lateral Step Up  Left;1 set;10 reps;Hand Hold: 2   3" step   Forward Step Up  Left;15 reps;Hand Hold: 2;1 set   3" step; improved   Wall Squat  1 set;10 reps;3 seconds    SLS  Lt SLS 5-15 sec x 3 reps, Rt SLS x 2 reps       Knee/Hip Exercises: Prone   Prone Knee Hang  --   30 seconds, 2 reps     Modalities   Modalities  Vasopneumatic;Moist Heat      Moist Heat Therapy   Number Minutes Moist Heat  15 Minutes    Moist Heat Location  Lumbar Spine      Vasopneumatic   Number Minutes Vasopneumatic   15 minutes    Vasopnuematic Location   Knee   Lt   Vasopneumatic Pressure  Medium    Vasopneumatic Temperature   34 deg                  PT Long Term Goals - 02/14/18 0942      PT LONG TERM GOAL #1   Title  Increase ROM Lt knee to 0 deg extensin - 120 deg flexion 03/14/18    Time  6    Period  Weeks    Status  Partially Met      PT LONG TERM GOAL #2   Title  5/5 strength Lt LE 03/14/18    Time  6    Period  Weeks    Status  On-going      PT LONG TERM GOAL #3   Title  Patient tolerating 30 min of standing and or walking; stepping up onto ladder 1-2 rungs to prepare for RTW 03/14/18    Time  6    Period  Weeks    Status  On-going      PT LONG TERM GOAL #4   Title  Independent in HEP 03/14/18    Time  6     Period  Weeks    Status  On-going      PT LONG TERM GOAL #5   Title  Improve FOTO to </= 51% limitation 03/14/18    Time  6    Period  Weeks    Status  On-going            Plan - 02/16/18 1149    Clinical Impression Statement  Pt now ambulating without assistive device without difficulty.  Pt demonstrated improved control with ascending / descending 3" step, as well has used less UE to pull herself up.  She tolerated all exercises well, with minimal increase in pain.  Progressing towards goals.     Rehab Potential  Good    PT Frequency  3x / week    PT Duration  6 weeks    PT Treatment/Interventions  Patient/family education;ADLs/Self Care Home Management;Cryotherapy;Electrical Stimulation;Iontophoresis '4mg'$ /ml Dexamethasone;Moist Heat;Dry needling;Manual techniques;Neuromuscular re-education    PT Next Visit Plan  continue progressive Lt knee ROM, strengthening, stretching.  Add quad/ hamstring stretch to HEP.     Consulted and Agree with Plan of Care  Patient       Patient will benefit from skilled therapeutic intervention in order to improve the following deficits and impairments:  Pain, Decreased mobility, Decreased range of motion, Decreased strength, Abnormal gait, Decreased activity tolerance  Visit Diagnosis: Acute pain of left knee  Muscle weakness (generalized)  Other abnormalities of gait and mobility  Localized edema     Problem List Patient Active Problem List   Diagnosis Date Noted  . History of total left knee replacement 02/10/2018  . Morbid obesity ([place]) 11/21/2017  . Primary osteoarthritis of left knee 09/22/2017  . Primary osteoarthritis of right knee 09/22/2017  . Attention deficit  hyperactivity disorder (ADHD) 05/12/2016  . Episodic mood disorder ([place]) 05/12/2016  . Type 2 diabetes mellitus without complication, without long-term current use of insulin ([place]) 05/12/2016  . Gastroesophageal reflux disease without esophagitis 05/12/2016  . DYSPNEA  07/22/2010  . CHEST PAIN 07/22/2010  . Hypothyroidism 09/13/2007  . Hyperlipidemia 09/13/2007  . ANXIETY DEPRESSION 09/13/2007  . MIGRAINE HEADACHE 09/13/2007  . Essential hypertension 09/13/2007  . ASTHMA 09/13/2007  . CHOLELITHIASIS, HX OF 09/13/2007  . NEPHROLITHIASIS, HX OF 09/13/2007  . CARPAL TUNNEL RELEASE, HX OF 09/13/2007   Kerin Perna, PTA 02/16/18 11:59 AM  [place], [place], [address] Phone: [phone]   Fax:  [phone]  Name: PRESLYNN BIER MRN: [id number] Date of Birth: [birth year]

## 2018-02-18 ENCOUNTER — Encounter: Payer: Self-pay | Admitting: Family Medicine

## 2018-02-18 ENCOUNTER — Ambulatory Visit (INDEPENDENT_AMBULATORY_CARE_PROVIDER_SITE_OTHER): Payer: BLUE CROSS/BLUE SHIELD | Admitting: Family Medicine

## 2018-02-18 ENCOUNTER — Encounter: Payer: BLUE CROSS/BLUE SHIELD | Admitting: Physical Therapy

## 2018-02-18 VITALS — BP 99/67 | HR 120 | Temp 97.3°F | Ht 67.0 in | Wt 233.0 lb

## 2018-02-18 DIAGNOSIS — L239 Allergic contact dermatitis, unspecified cause: Secondary | ICD-10-CM | POA: Diagnosis not present

## 2018-02-18 MED ORDER — METHYLPREDNISOLONE ACETATE 80 MG/ML IJ SUSP
80.0000 mg | Freq: Once | INTRAMUSCULAR | Status: AC
  Administered 2018-02-18: 80 mg via INTRAMUSCULAR

## 2018-02-18 MED ORDER — MOMETASONE FUROATE 0.1 % EX CREA: 1.0000 "application " | TOPICAL_CREAM | Freq: Every day | CUTANEOUS | 0 refills | Status: DC

## 2018-02-18 NOTE — Progress Notes (Signed)
Subjective: CC: rash PCP: Terald Sleeper, PA-C HPI:Judith [name] is a 47 y.o. female presenting to clinic today for:  1. Rash Patient reports that she developed a rash along her lower extremities and left upper extremity on Saturday.  She has noticed spots on her back.  She reports that the lesion on her forearm is itchy but also somewhat painful.  She was recently hospitalized for pyelonephritis and is currently on Levaquin.  She is tolerated Levaquin without difficulty in the past does not think this is related.  She is been using hydroxyzine for itching as well as topical hydrocortisone cream.  She is intolerant to prednisone.  However, she can tolerate injected steroid.  She denies any new detergents, soaps, lotions.  No unusual consumption of foods.  She does not recall being bitten by any ticks.  No fevers.  She has noticed some pain in her knees and has a history of OA in the left knee with history of knee replacement.  She is currently under the care of her PCP and outpatient physical therapy for this.  Her son also broke out in a rash on Saturday but his looks completely different.   ROS: Per HPI  Allergies  Allergen Reactions  . Diphenhydramine Other (See Comments)    Hair feels like it is crawling  . Morphine Nausea And Vomiting    PCA PUMP N/V IV PUSH IN ER NO PROBLEM PER PT.  . Prednisone Nausea And Vomiting  . Erythromycin Hives  . Sulfonamide Derivatives Other (See Comments)    Does not take due to family history   Past Medical History:  Diagnosis Date  . Asthma   . Bipolar 1 disorder ([place])   . Depression   . GERD (gastroesophageal reflux disease)   . Hyperlipemia   . Hypothyroid   . Migraines     Current Outpatient Medications:  .  albuterol (PROVENTIL) (2.5 MG/3ML) 0.083% nebulizer solution, Nebulize 1 vial three times daily, Disp: 150 mL, Rfl: 2 .  aspirin EC 81 MG tablet, Take 162 mg by mouth daily. , Disp: , Rfl:  .  atorvastatin (LIPITOR) 10 MG tablet,  Take 1 tablet (10 mg total) by mouth daily., Disp: 90 tablet, Rfl: 3 .  budesonide-formoterol (SYMBICORT) 160-4.5 MCG/ACT inhaler, Inhale 2 puffs into the lungs 2 (two) times daily., Disp: 1 Inhaler, Rfl: 3 .  busPIRone (BUSPAR) 15 MG tablet, Take 15 mg by mouth 3 (three) times daily. , Disp: , Rfl:  .  cetirizine (ZYRTEC) 10 MG tablet, Take 1 tablet (10 mg total) by mouth daily., Disp: 30 tablet, Rfl: 11 .  diclofenac sodium (VOLTAREN) 1 % GEL, Apply 4 g topically 4 (four) times daily., Disp: 400 g, Rfl: 2 .  dicyclomine (BENTYL) 20 MG tablet, Take 1 tablet (20 mg total) by mouth every 6 (six) hours., Disp: 60 tablet, Rfl: 5 .  hydrOXYzine (ATARAX/VISTARIL) 25 MG tablet, Take 25 mg by mouth daily., Disp: , Rfl:  .  ibuprofen (ADVIL,MOTRIN) 800 MG tablet, Take 1 tablet (800 mg total) by mouth every 8 (eight) hours as needed., Disp: 90 tablet, Rfl: 0 .  lamoTRIgine (LAMICTAL) 200 MG tablet, Take 1 tablet (200 mg total) by mouth daily. (Patient taking differently: Take 150 mg by mouth 2 (two) times daily. 150mg  BID), Disp: 30 tablet, Rfl: 6 .  levofloxacin (LEVAQUIN) 750 MG tablet, TAKE ONE TABLET (750 MG DOSE) BY MOUTH DAILY FOR 14 DAYS., Disp: , Rfl: 0 .  levothyroxine (SYNTHROID, LEVOTHROID) 125  MCG tablet, Take 1 tablet (125 mcg total) by mouth daily., Disp: 90 tablet, Rfl: 1 .  Lisdexamfetamine Dimesylate (VYVANSE PO), Take by mouth., Disp: , Rfl:  .  lisinopril (PRINIVIL,ZESTRIL) 20 MG tablet, Take 1 tablet (20 mg total) by mouth daily., Disp: 30 tablet, Rfl: 5 .  montelukast (SINGULAIR) 10 MG tablet, Take 1 tablet (10 mg total) by mouth at bedtime., Disp: 90 tablet, Rfl: 0 .  ondansetron (ZOFRAN ODT) 8 MG disintegrating tablet, Take 1 tablet (8 mg total) by mouth every 8 (eight) hours as needed for nausea or vomiting., Disp: 20 tablet, Rfl: 0 .  pantoprazole (PROTONIX) 40 MG tablet, Take 1 tablet (40 mg total) by mouth 2 (two) times daily., Disp: 60 tablet, Rfl: 5 .  pioglitazone (ACTOS) 15 MG  tablet, Take 1 tablet (15 mg total) by mouth daily., Disp: 30 tablet, Rfl: 5 .  propranolol (INDERAL) 20 MG tablet, , Disp: , Rfl:  .  sertraline (ZOLOFT) 100 MG tablet, Take 200 mg by mouth daily. , Disp: , Rfl:  .  tiZANidine (ZANAFLEX) 4 MG tablet, Take 4 mg by mouth every 6 (six) hours as needed for muscle spasms., Disp: , Rfl:  .  traMADol (ULTRAM) 50 MG tablet, Take 1 tablet (50 mg total) by mouth every 8 (eight) hours as needed for severe pain., Disp: 90 tablet, Rfl: 2 .  SUMAtriptan Succinate 4 MG/0.5ML SOCT, Inject 4 mg into the skin once., Disp: 9 Cartridge, Rfl: 11 Social History   Socioeconomic History  . Marital status: Divorced    Spouse name: Not on file  . Number of children: Not on file  . Years of education: Not on file  . Highest education level: Not on file  Occupational History  . Not on file  Social Needs  . Financial resource strain: Not on file  . Food insecurity:    Worry: Not on file    Inability: Not on file  . Transportation needs:    Medical: Not on file    Non-medical: Not on file  Tobacco Use  . Smoking status: Former Research scientist (life sciences)  . Smokeless tobacco: Never Used  Substance and Sexual Activity  . Alcohol use: No  . Drug use: No  . Sexual activity: Yes    Birth control/protection: IUD  Lifestyle  . Physical activity:    Days per week: Not on file    Minutes per session: Not on file  . Stress: Not on file  Relationships  . Social connections:    Talks on phone: Not on file    Gets together: Not on file    Attends religious service: Not on file    Active member of club or organization: Not on file    Attends meetings of clubs or organizations: Not on file    Relationship status: Not on file  . Intimate partner violence:    Fear of current or ex partner: Not on file    Emotionally abused: Not on file    Physically abused: Not on file    Forced sexual activity: Not on file  Other Topics Concern  . Not on file  Social History Narrative  . Not on  file   Family History  Problem Relation Age of Onset  . Mental illness Unknown   . Diabetes Unknown     Objective: Office vital signs reviewed. BP 99/67   Pulse (!) 120   Temp (!) 97.3 F (36.3 C) (Oral)   Ht 5\' 7"  (1.702 m)  Wt 233 lb (105.7 kg)   BMI 36.49 kg/m   Physical Examination:  General: Awake, alert, nontoxic, No acute distress Skin: small area of wheal appreciated along left forearm.  There is mild warmth associated and blanching erythema.  No palpable fluctuance.  She has another small area along her left flank that is similar in appearance.  She has multiple insect bites along bilateral lower extremities posteriorly.  No target lesions appreciated.  Assessment/ Plan: 47 y.o. female   1. Allergic contact dermatitis, unspecified trigger Patient is afebrile but her pulse is slightly increased.  Difficult to tell if this is related to pain response.  I have reviewed her blood pressures previously and she tends to be in the low normal range.  She is currently treated with Levaquin for recent pyelonephritis.  I do not think that her rash today looks like a drug reaction or Lyme but rather an allergic reaction.  She was given a dose of Depo-Medrol 88 today.  I prescribed her mometasone to apply to affected regions.  Reasons for return and emergent evaluation the emergency department discussed with the patient.  She voiced good understanding. Recommended reevaluation w/ PCP in 1 week. - methylPREDNISolone acetate (DEPO-MEDROL) injection 80 mg   Meds ordered this encounter  Medications  . methylPREDNISolone acetate (DEPO-MEDROL) injection 80 mg  . mometasone (ELOCON) 0.1 % cream    Sig: Apply 1 application topically daily. x10 days    Dispense:  45 g    Refill:  0     Maude Hettich Windell Moulding, DO [place] [phone]

## 2018-02-18 NOTE — Patient Instructions (Signed)
Contact Dermatitis Dermatitis is redness, soreness, and swelling (inflammation) of the skin. Contact dermatitis is a reaction to certain substances that touch the skin. You either touched something that irritated your skin, or you have allergies to something you touched. Follow these instructions at home: Skin Care  Moisturize your skin as needed.  Apply cool compresses to the affected areas.  Try taking a bath with: ? Epsom salts. Follow the instructions on the package. You can get these at a pharmacy or grocery store. ? Baking soda. Pour a small amount into the bath as told by your doctor. ? Colloidal oatmeal. Follow the instructions on the package. You can get this at a pharmacy or grocery store.  Try applying baking soda paste to your skin. Stir water into baking soda until it looks like paste.  Do not scratch your skin.  Bathe less often.  Bathe in lukewarm water. Avoid using hot water. Medicines  Take or apply over-the-counter and prescription medicines only as told by your doctor.  If you were prescribed an antibiotic medicine, take or apply your antibiotic as told by your doctor. Do not stop taking the antibiotic even if your condition starts to get better. General instructions  Keep all follow-up visits as told by your doctor. This is important.  Avoid the substance that caused your reaction. If you do not know what caused it, keep a journal to try to track what caused it. Write down: ? What you eat. ? What cosmetic products you use. ? What you drink. ? What you wear in the affected area. This includes jewelry.  If you were given a bandage (dressing), take care of it as told by your doctor. This includes when to change and remove it. Contact a doctor if:  You do not get better with treatment.  Your condition gets worse.  You have signs of infection such as: ? Swelling. ? Tenderness. ? Redness. ? Soreness. ? Warmth.  You have a fever.  You have new  symptoms. Get help right away if:  You have a very bad headache.  You have neck pain.  Your neck is stiff.  You throw up (vomit).  You feel very sleepy.  You see red streaks coming from the affected area.  Your bone or joint underneath the affected area becomes painful after the skin has healed.  The affected area turns darker.  You have trouble breathing. This information is not intended to replace advice given to you by your health care provider. Make sure you discuss any questions you have with your health care provider. Document Released: 03/01/2009 Document Revised: 10/10/2015 Document Reviewed: 09/19/2014 Elsevier Interactive Patient Education  2018 Elsevier Inc.  

## 2018-02-21 ENCOUNTER — Encounter: Payer: Self-pay | Admitting: Nurse Practitioner

## 2018-02-21 ENCOUNTER — Ambulatory Visit: Payer: BLUE CROSS/BLUE SHIELD | Admitting: Nurse Practitioner

## 2018-02-21 ENCOUNTER — Encounter: Payer: BLUE CROSS/BLUE SHIELD | Admitting: Physical Therapy

## 2018-02-21 VITALS — BP 127/86 | HR 100 | Temp 97.0°F | Ht 67.0 in | Wt 233.0 lb

## 2018-02-21 DIAGNOSIS — W57XXXA Bitten or stung by nonvenomous insect and other nonvenomous arthropods, initial encounter: Secondary | ICD-10-CM

## 2018-02-21 DIAGNOSIS — R21 Rash and other nonspecific skin eruption: Secondary | ICD-10-CM

## 2018-02-21 DIAGNOSIS — R52 Pain, unspecified: Secondary | ICD-10-CM

## 2018-02-21 MED ORDER — HYDROXYZINE HCL 25 MG PO TABS: 25.0000 mg | ORAL_TABLET | Freq: Every day | ORAL | 1 refills | Status: DC

## 2018-02-21 NOTE — Progress Notes (Addendum)
   Subjective:    Patient ID: Judith [name], female    DOB: Aug 26, 1970, 47 y.o.   MRN: [id number]   Chief Complaint: rash  HPI patient comes in c/o rash. She was seen by Dr. Alinda Deem on 02/18/18 with same rash and was treated with steroids and elicon cream. Rash is no better and is very itchy.rash in on arms thighs and back. She also c/o achiness . Thinks she may have gotten a tock bite  Review of Systems  Constitutional: Negative.   Respiratory: Negative.   Cardiovascular: Negative.   Genitourinary: Negative.   Skin: Positive for rash.  Neurological: Negative.   Psychiatric/Behavioral: Negative.   All other systems reviewed and are negative.      Objective:   Physical Exam  Constitutional: She appears well-developed and well-nourished. No distress.  Cardiovascular: Normal rate.  Pulmonary/Chest: Effort normal.  Abdominal: Soft.  Skin: Skin is warm.  Erythematous pachy mauclar rash in various patches all over body.  Psychiatric: She has a normal mood and affect. Her behavior is normal. Thought content normal.   BP 127/86   Pulse 100   Temp (!) 97 F (36.1 C) (Oral)   Ht 5\' 7"  (1.702 m)   Wt 233 lb (105.7 kg)   BMI 36.49 kg/m       Assessment & Plan:  [name] in today with chief complaint of No chief complaint on file.   1. Bug bite, initial encounter avoid scratching Continue steroid cream RTO prn - hydrOXYzine (ATARAX/VISTARIL) 25 MG tablet; Take 1 tablet (25 mg total) by mouth daily.  Dispense: 30 tablet; Refill: 1  2. Generalized body aches Labs pending - Lyme Ab/Western Blot Reflex  Mary-Margaret Hassell Done, FNP

## 2018-02-21 NOTE — Patient Instructions (Signed)
Insect Bite, Adult An insect bite can make your skin red, itchy, and swollen. Some insects can spread disease to people with a bite. However, most insect bites do not lead to disease, and most are not serious. Follow these instructions at home: Bite area care  Do not scratch the bite area.  Keep the bite area clean and dry.  Wash the bite area every day with soap and water as told by your doctor.  Check the bite area every day for signs of infection. Check for: ? More redness, swelling, or pain. ? Fluid or blood. ? Warmth. ? Pus. Managing pain, itching, and swelling  You may put any of these on the bite area as told by your doctor: ? A baking soda paste. ? Cortisone cream. ? Calamine lotion.  If directed, put ice on the bite area. ? Put ice in a plastic bag. ? Place a towel between your skin and the bag. ? Leave the ice on for 20 minutes, 2-3 times a day. Medicines  Take medicines or put medicines on your skin only as told by your doctor.  If you were prescribed an antibiotic medicine, use it as told by your doctor. Do not stop using the antibiotic even if your condition improves. General instructions  Keep all follow-up visits as told by your doctor. This is important. How is this prevented? To help you have a lower risk of insect bites:  When you are outside, wear clothing that covers your arms and legs.  Use insect repellent. The best insect repellents have: ? An active ingredient of DEET, picaridin, oil of lemon eucalyptus (OLE), or IR3535. ? Higher amounts of DEET or another active ingredient than other repellents have.  If your home windows do not have screens, think about putting some in.  Contact a doctor if:  You have more redness, swelling, or pain in the bite area.  You have fluid, blood, or pus coming from the bite area.  The bite area feels warm.  You have a fever. Get help right away if:  You have joint pain.  You have a rash.  You have  shortness of breath.  You feel more tired or sleepy than you normally do.  You have neck pain.  You have a headache.  You feel weaker than you normally do.  You have chest pain.  You have pain in your belly.  You feel sick to your stomach (nauseous) or you throw up (vomit). Summary  An insect bite can make your skin red, itchy, and swollen.  Do not scratch the bite area, and keep it clean and dry.  Ice can help with pain and itching from the bite. This information is not intended to replace advice given to you by your health care provider. Make sure you discuss any questions you have with your health care provider. Document Released: 05/01/2000 Document Revised: 12/05/2015 Document Reviewed: 09/19/2014 Elsevier Interactive Patient Education  2018 Elsevier Inc.  

## 2018-02-22 ENCOUNTER — Encounter: Payer: Self-pay | Admitting: Neurology

## 2018-02-22 ENCOUNTER — Ambulatory Visit (INDEPENDENT_AMBULATORY_CARE_PROVIDER_SITE_OTHER): Payer: BLUE CROSS/BLUE SHIELD | Admitting: Neurology

## 2018-02-22 VITALS — BP 137/86 | HR 96 | Ht 68.0 in | Wt 236.0 lb

## 2018-02-22 DIAGNOSIS — J4520 Mild intermittent asthma, uncomplicated: Secondary | ICD-10-CM

## 2018-02-22 DIAGNOSIS — R0683 Snoring: Secondary | ICD-10-CM

## 2018-02-22 DIAGNOSIS — G4734 Idiopathic sleep related nonobstructive alveolar hypoventilation: Secondary | ICD-10-CM

## 2018-02-22 DIAGNOSIS — Z9289 Personal history of other medical treatment: Secondary | ICD-10-CM | POA: Diagnosis not present

## 2018-02-22 DIAGNOSIS — E669 Obesity, unspecified: Secondary | ICD-10-CM | POA: Diagnosis not present

## 2018-02-22 DIAGNOSIS — R400 Somnolence: Secondary | ICD-10-CM | POA: Diagnosis not present

## 2018-02-22 DIAGNOSIS — R51 Headache: Secondary | ICD-10-CM

## 2018-02-22 DIAGNOSIS — R351 Nocturia: Secondary | ICD-10-CM

## 2018-02-22 DIAGNOSIS — R519 Headache, unspecified: Secondary | ICD-10-CM

## 2018-02-22 NOTE — Patient Instructions (Signed)

## 2018-02-22 NOTE — Progress Notes (Signed)
Subjective:    Patient ID: Judith [name] is a 47 y.o. female.  HPI     Star Age, MD, PhD [facility] [address], [address] P.O. [place], [place] [address]  Dear Judith [name],   I saw your patient, Judith [name], upon your kind request in my neurologic clinic today for initial consultation of her sleep disorder, in particular, concern for underlying obstructive sleep apnea. The patient is accompanied by her fiance today. She was previously referred to sleep clinic last year but missed an appointment on 08/05/2016. As you know, [name] is a 47 year old right-handed woman with an underlying medical history of migraine headaches, mood d/o, including Bipolar d/o, asthma, hyperlipidemia, hypothyroidism, status post multiple surgeries including carpal tunnel surgery bilaterally, cesarean section, cholecystectomy, thyroidectomy, tonsillectomy, history of morbid obesity, who reports snoring and excessive daytime somnolence. She has previously had a sleep study. Prior sleep study results are not available for my review today. I reviewed your office note from 02/10/2018. She was hospitalized last month secondary to sepsis with pyelonephritis and was noted to have oxygen desaturations while asleep. Her Epworth sleepiness score is 17 out of 24, fatigue score is 63 out of 63. She lives with her fianc, her son and aunt. She works for [employer]. She drinks not currently alcohol, maybe rare. She drinks caffeine in the form of soda, 20 ounce per day on average. She quit smoking in 94. She was given oxygen at night during her hospital stay. She does have nocturia about 3-4 times per night, she has occasional morning headaches. She has a FHx of OSA in maternal aunt, and father. She is trying to lose weight.  Her Past Medical History Is Significant For: Past Medical History:  Diagnosis Date  . Asthma   . Bipolar 1 disorder ([place])   . Depression   . GERD (gastroesophageal  reflux disease)   . Hyperlipemia   . Hypothyroid   . Migraines     Her Past Surgical History Is Significant For: Past Surgical History:  Procedure Laterality Date  . ABDOMINAL SURGERY    . CARPAL TUNNEL RELEASE Bilateral 1999  . CESAREAN SECTION    . CHOLECYSTECTOMY    . THYROIDECTOMY    . TONSILLECTOMY      Her Family History Is Significant For: Family History  Problem Relation Age of Onset  . Mental illness Unknown   . Diabetes Unknown     Her Social History Is Significant For: Social History   Socioeconomic History  . Marital status: Divorced    Spouse name: Not on file  . Number of children: Not on file  . Years of education: Not on file  . Highest education level: Not on file  Occupational History  . Not on file  Social Needs  . Financial resource strain: Not on file  . Food insecurity:    Worry: Not on file    Inability: Not on file  . Transportation needs:    Medical: Not on file    Non-medical: Not on file  Tobacco Use  . Smoking status: Former Research scientist (life sciences)  . Smokeless tobacco: Never Used  Substance and Sexual Activity  . Alcohol use: No  . Drug use: No  . Sexual activity: Yes    Birth control/protection: IUD  Lifestyle  . Physical activity:    Days per week: Not on file    Minutes per session: Not on file  . Stress: Not on file  Relationships  .  Social connections:    Talks on phone: Not on file    Gets together: Not on file    Attends religious service: Not on file    Active member of club or organization: Not on file    Attends meetings of clubs or organizations: Not on file    Relationship status: Not on file  Other Topics Concern  . Not on file  Social History Narrative  . Not on file    Her Allergies Are:  Allergies  Allergen Reactions  . Diphenhydramine Other (See Comments)    Hair feels like it is crawling  . Morphine Nausea And Vomiting    PCA PUMP N/V IV PUSH IN ER NO PROBLEM PER PT.  . Prednisone Nausea And Vomiting  .  Erythromycin Hives  . Sulfonamide Derivatives Other (See Comments)    Does not take due to family history  :   Her Current Medications Are:  Outpatient Encounter Medications as of 02/22/2018  Medication Sig  . albuterol (PROVENTIL) (2.5 MG/3ML) 0.083% nebulizer solution Nebulize 1 vial three times daily  . aspirin EC 81 MG tablet Take 162 mg by mouth daily.   Marland Kitchen atorvastatin (LIPITOR) 10 MG tablet Take 1 tablet (10 mg total) by mouth daily.  . budesonide-formoterol (SYMBICORT) 160-4.5 MCG/ACT inhaler Inhale 2 puffs into the lungs 2 (two) times daily.  . busPIRone (BUSPAR) 15 MG tablet Take 15 mg by mouth 3 (three) times daily.   . cetirizine (ZYRTEC) 10 MG tablet Take 1 tablet (10 mg total) by mouth daily.  . diclofenac sodium (VOLTAREN) 1 % GEL Apply 4 g topically 4 (four) times daily.  Marland Kitchen dicyclomine (BENTYL) 20 MG tablet Take 1 tablet (20 mg total) by mouth every 6 (six) hours.  . hydrOXYzine (ATARAX/VISTARIL) 25 MG tablet Take 1 tablet (25 mg total) by mouth daily.  Marland Kitchen ibuprofen (ADVIL,MOTRIN) 800 MG tablet Take 1 tablet (800 mg total) by mouth every 8 (eight) hours as needed.  . lamoTRIgine (LAMICTAL) 150 MG tablet Take 150 mg by mouth 2 (two) times daily.  Marland Kitchen levofloxacin (LEVAQUIN) 750 MG tablet TAKE ONE TABLET (750 MG DOSE) BY MOUTH DAILY FOR 14 DAYS.  Marland Kitchen levothyroxine (SYNTHROID, LEVOTHROID) 125 MCG tablet Take 1 tablet (125 mcg total) by mouth daily.  . Lisdexamfetamine Dimesylate (VYVANSE PO) Take 70 mg by mouth daily.   Marland Kitchen lisinopril (PRINIVIL,ZESTRIL) 20 MG tablet Take 1 tablet (20 mg total) by mouth daily.  . mometasone (ELOCON) 0.1 % cream Apply 1 application topically daily. x10 days  . montelukast (SINGULAIR) 10 MG tablet Take 1 tablet (10 mg total) by mouth at bedtime.  . ondansetron (ZOFRAN ODT) 8 MG disintegrating tablet Take 1 tablet (8 mg total) by mouth every 8 (eight) hours as needed for nausea or vomiting.  . pantoprazole (PROTONIX) 40 MG tablet Take 1 tablet (40 mg total)  by mouth 2 (two) times daily.  . pioglitazone (ACTOS) 15 MG tablet Take 1 tablet (15 mg total) by mouth daily.  . propranolol (INDERAL) 20 MG tablet   . sertraline (ZOLOFT) 100 MG tablet Take 200 mg by mouth daily.   Marland Kitchen tiZANidine (ZANAFLEX) 4 MG tablet Take 4 mg by mouth every 6 (six) hours as needed for muscle spasms.  . traMADol (ULTRAM) 50 MG tablet Take 1 tablet (50 mg total) by mouth every 8 (eight) hours as needed for severe pain.  . SUMAtriptan Succinate 4 MG/0.5ML SOCT Inject 4 mg into the skin once.  . [DISCONTINUED] lamoTRIgine (LAMICTAL) 200 MG  tablet Take 1 tablet (200 mg total) by mouth daily. (Patient taking differently: Take 150 mg by mouth 2 (two) times daily. 150mg  BID)   No facility-administered encounter medications on file as of 02/22/2018.   :  Review of Systems:  Out of a complete 14 point review of systems, all are reviewed and negative with the exception of these symptoms as listed below: Review of Systems  Neurological:       Pt presents today to discuss her sleep. Pt had a sleep study about 12 years ago and did not need a cpap. Pt was recently hospitalized and was told that her O2 dropped at night.  Epworth Sleepiness Scale 0= would never doze 1= slight chance of dozing 2= moderate chance of dozing 3= high chance of dozing  Sitting and reading: 3 Watching TV: 2 Sitting inactive in a public place (ex. Theater or meeting): 2 As a passenger in a car for an hour without a break: 2 Lying down to rest in the afternoon: 3 Sitting and talking to someone: 1 Sitting quietly after lunch (no alcohol): 3 In a car, while stopped in traffic: 1 Total: 17     Objective:  Neurological Exam  Physical Exam Physical Examination:   Vitals:   02/22/18 1044  BP: 137/86  Pulse: 96    General Examination: The patient is a very pleasant 47 y.o. female in no acute distress. She appears well-developed and well-nourished and well groomed.   HEENT: Normocephalic,  atraumatic, pupils are equal, round and reactive to light and accommodation. Extraocular tracking is good without limitation to gaze excursion or nystagmus noted. Normal smooth pursuit is noted. Hearing is grossly intact. Tympanic membranes are clear bilaterally. Face is symmetric with normal facial animation and normal facial sensation. Speech is clear with no dysarthria noted. There is no hypophonia. There is no lip, neck/head, jaw or voice tremor. Neck is supple with full range of passive and active motion. There are no carotid bruits on auscultation. Oropharynx exam reveals: moderate mouth dryness, adequate dental hygiene and mild airway crowding, due to smaller airway entry. Mallampati is class I. Tongue protrudes centrally and palate elevates symmetrically. Tonsils are absent. Neck size is 16.75 inches. She has a minimal overbite but several missing teeth.   Chest: Clear to auscultation without wheezing, rhonchi or crackles noted.  Heart: S1+S2+0, regular and normal without murmurs, rubs or gallops noted.   Abdomen: Soft, non-tender and non-distended with normal bowel sounds appreciated on auscultation.  Extremities: There is no pitting edema in the distal lower extremities bilaterally.   Skin: Warm and dry without trophic changes noted. There are no varicose veins.  Musculoskeletal: exam reveals no obvious joint deformities, tenderness or joint swelling or erythema, unremarkable scar from left total knee replacement, well-healing. Older scars right knee from arthroscopic surgery.   Neurologically:  Mental status: The patient is awake, alert and oriented in all 4 spheres. Her immediate and remote memory, attention, language skills and fund of knowledge are appropriate. There is no evidence of aphasia, agnosia, apraxia or anomia. Speech is clear with normal prosody and enunciation. Thought process is linear. Mood is normal and affect is normal.  Cranial nerves II - XII are as described above  under HEENT exam. In addition: shoulder shrug is normal with equal shoulder height noted. Motor exam: Normal bulk, strength and tone is noted. There is no drift, tremor or rebound. Romberg is negative. Reflexes are 1+ throughout. Babinski: Toes are flexor bilaterally. Fine motor skills and coordination:  intact with normal finger taps, normal hand movements, normal rapid alternating patting, normal foot taps and normal foot agility.  Cerebellar testing: No dysmetria or intention tremor on finger to nose testing. Heel to shin is unremarkable bilaterally. There is no truncal or gait ataxia.  Sensory exam: intact to light touch in the upper and lower extremities.  Gait, station and balance: She stands easily. No veering to one side is noted. No leaning to one side is noted. Posture is age-appropriate and stance is narrow based. Gait shows normal stride length and normal pace. No problems turning are noted. Slight limp on the L.  Assessment and Plan:   In summary, Maryama R Staples is a very pleasant 47 y.o.-year old female with an underlying medical history of migraine headaches, mood d/o, including Bipolar d/o, asthma, hyperlipidemia, hypothyroidism, status post multiple surgeries including carpal tunnel surgery bilaterally, cesarean section, cholecystectomy, thyroidectomy, tonsillectomy, history of morbid obesity, whose history and physical exam are concerning for obstructive sleep apnea (OSA). I had a long chat with the patient and fiance about my findings and the diagnosis of OSA, its prognosis and treatment options. We talked about medical treatments, surgical interventions and non-pharmacological approaches. I explained in particular the risks and ramifications of untreated moderate to severe OSA, especially with respect to developing cardiovascular disease down the Road, including congestive heart failure, difficult to treat hypertension, cardiac arrhythmias, or stroke. Even type 2 diabetes has, in part, been  linked to untreated OSA. Symptoms of untreated OSA include daytime sleepiness, memory problems, mood irritability and mood disorder such as depression and anxiety, lack of energy, as well as recurrent headaches, especially morning headaches. We talked about trying to maintain a healthy lifestyle in general, as well as the importance of weight control. I encouraged the patient to eat healthy, exercise daily and keep well hydrated, to keep a scheduled bedtime and wake time routine, to not skip any meals and eat healthy snacks in between meals. I advised the patient not to drive when feeling sleepy.  I recommended the following at this time: sleep study with potential positive airway pressure titration. (We will score hypopneas at 3%).   I explained the sleep test procedure to the patient and also outlined possible surgical and non-surgical treatment options of OSA, including the use of a custom-made dental device (which would require a referral to a specialist dentist or oral surgeon), upper airway surgical options, such as pillar implants, radiofrequency surgery, tongue base surgery, and UPPP (which would involve a referral to an ENT surgeon). Rarely, jaw surgery such as mandibular advancement may be considered.  I also explained the CPAP treatment option to the patient, who indicated that she would be willing to try CPAP if the need arises. I explained the importance of being compliant with PAP treatment, not only for insurance purposes but primarily to improve Her symptoms, and for the patient's long term health benefit, including to reduce Her cardiovascular risks. I answered all their questions today and the patient and her fiance were in agreement. I plan to see her back after the sleep study is completed and encouraged them to call with any interim questions, concerns, problems or updates.   Thank you very much for allowing me to participate in the care of this nice patient. If I can be of any further  assistance to you please do not hesitate to call me at [phone].  Sincerely,   Star Age, MD, PhD

## 2018-02-23 ENCOUNTER — Ambulatory Visit (INDEPENDENT_AMBULATORY_CARE_PROVIDER_SITE_OTHER): Payer: BLUE CROSS/BLUE SHIELD | Admitting: Physical Therapy

## 2018-02-23 ENCOUNTER — Telehealth: Payer: Self-pay | Admitting: Physician Assistant

## 2018-02-23 ENCOUNTER — Other Ambulatory Visit: Payer: Self-pay | Admitting: Physician Assistant

## 2018-02-23 ENCOUNTER — Other Ambulatory Visit: Payer: BLUE CROSS/BLUE SHIELD

## 2018-02-23 ENCOUNTER — Ambulatory Visit: Payer: BLUE CROSS/BLUE SHIELD | Admitting: Family Medicine

## 2018-02-23 DIAGNOSIS — M6281 Muscle weakness (generalized): Secondary | ICD-10-CM | POA: Diagnosis not present

## 2018-02-23 DIAGNOSIS — R600 Localized edema: Secondary | ICD-10-CM

## 2018-02-23 DIAGNOSIS — M25562 Pain in left knee: Secondary | ICD-10-CM

## 2018-02-23 DIAGNOSIS — R2689 Other abnormalities of gait and mobility: Secondary | ICD-10-CM

## 2018-02-23 DIAGNOSIS — L239 Allergic contact dermatitis, unspecified cause: Secondary | ICD-10-CM

## 2018-02-23 DIAGNOSIS — W57XXXA Bitten or stung by nonvenomous insect and other nonvenomous arthropods, initial encounter: Secondary | ICD-10-CM | POA: Diagnosis not present

## 2018-02-23 DIAGNOSIS — R6889 Other general symptoms and signs: Secondary | ICD-10-CM | POA: Diagnosis not present

## 2018-02-23 NOTE — Telephone Encounter (Signed)
PT states that rash is not getting better would like referral for dermatology

## 2018-02-23 NOTE — Therapy (Signed)
Judith [name], [place], [address] Phone: [phone]   Fax:  [phone]  Physical Therapy Treatment  Patient Details  Name: Judith [name] MRN: [id number] Date of Birth: 07/08/1970 Referring Provider (PT): Dr Herbie Baltimore Jones/Jeffrey Shaune Leeks PA-C    Encounter Date: 02/23/2018  PT End of Session - 02/23/18 0935    Visit Number  5    Number of Visits  18    Date for PT Re-Evaluation  03/14/18    PT Start Time  0931    PT Stop Time  1027    PT Time Calculation (min)  56 min    Activity Tolerance  Patient tolerated treatment well    Behavior During Therapy  [facility] for tasks assessed/performed       Past Medical History:  Diagnosis Date  . Asthma   . Bipolar 1 disorder ([place])   . Depression   . GERD (gastroesophageal reflux disease)   . Hyperlipemia   . Hypothyroid   . Migraines     Past Surgical History:  Procedure Laterality Date  . ABDOMINAL SURGERY    . CARPAL TUNNEL RELEASE Bilateral 1999  . CESAREAN SECTION    . CHOLECYSTECTOMY    . THYROIDECTOMY    . TONSILLECTOMY      There were no vitals filed for this visit.  Subjective Assessment - 02/23/18 0935    Subjective  Judith [name] reports she began having a rash on Saturday (4 days ago) and then had vomiting /diarrhea on Friday.  Today she is feeling better but has "bone aches" throughout body.      Currently in Pain?  Yes    Pain Score  6     Pain Location  Generalized    Pain Descriptors / Indicators  Aching;Sore    Aggravating Factors   ?    Pain Relieving Factors  laying around          [facility] PT Assessment - 02/23/18 0001      Assessment   Medical Diagnosis  Lt TKA    Referring Provider (PT)  Dr Herbie Baltimore Jones/Jeffrey Shaune Leeks PA-C     Onset Date/Surgical Date  01/20/18    Hand Dominance  Right    Next MD Visit  around 03/21/18      AROM   Left Knee Extension  -2    Left Knee Flexion  134      Strength   Left  Hip Flexion  5/5    Left Hip Extension  --   5-/5   Left Hip ABduction  --   5-/5     Flexibility   Soft Tissue Assessment /Muscle Length  yes    Quadriceps  Lt knee 110 deg       [facility] Adult PT Treatment/Exercise - 02/23/18 0001      Exercises   Exercises  Knee/Hip      Knee/Hip Exercises: Stretches   Passive Hamstring Stretch  Left;3 reps;30 seconds   1 rep standing, 2 reps supine   Quad Stretch  Left;Right;2 reps;30 seconds   prone with strap   Gastroc Stretch  Left;Right;2 reps;30 seconds   runners stretch     Knee/Hip Exercises: Aerobic   Recumbent Bike  L1: 6 min     Other Aerobic  lap around gym in between exercises to decrease stiffness.       Knee/Hip Exercises: Standing   Forward Step Up  Left;1 set;Hand Hold: 2;Step Height: 6";15  reps   improved   SLS  Lt/ Rt SLS x 30 sec (without UE support);  repeated on blue pad up to 15-20 sec with occasional UE to steady    Other Standing Knee Exercises  retro gait with focus on TKE, next to counter x 12 ft x 6 reps      Knee/Hip Exercises: Supine   Quad Sets  Strengthening;Left;1 set;5 reps   10 sec holds   Bridges  1 set;10 reps   legs on top of green Pball   Other Supine Knee/Hip Exercises  ball roll hamstring curls for increased knee ROM; trunk rotation x 15 reps bil      Knee/Hip Exercises: Sidelying   Hip ABduction  Strengthening;Left;1 set;10 reps    Clams  Rt clam x 10 reps       Vasopneumatic   Number Minutes Vasopneumatic   15 minutes    Vasopnuematic Location   Knee   Lt   Vasopneumatic Pressure  Medium    Vasopneumatic Temperature   34 deg                  PT Long Term Goals - 02/23/18 1015      PT LONG TERM GOAL #1   Title  Increase ROM Lt knee to 0 deg extensin - 120 deg flexion 03/14/18    Time  6    Period  Weeks    Status  Partially Met      PT LONG TERM GOAL #2   Title  5/5 strength Lt LE 03/14/18    Time  6    Period  Weeks    Status  Partially Met      PT LONG TERM GOAL  #3   Title  Patient tolerating 30 min of standing and or walking; stepping up onto ladder 1-2 rungs to prepare for RTW 03/14/18    Time  6    Period  Weeks    Status  On-going      PT LONG TERM GOAL #4   Title  Independent in HEP 03/14/18    Time  6    Period  Weeks    Status  On-going      PT LONG TERM GOAL #5   Title  Improve FOTO to </= 51% limitation 03/14/18    Time  6    Period  Weeks    Status  On-going            Plan - 02/23/18 0954    Clinical Impression Statement  Improved Lt hip strength; has partially met LTG#3.  Pt demonstrated improved SLS, up to 30 sec on LLE without UE support.  She can ascend/descend 6" step with improved control.  She tolerated all exercises well.  Pt progressing well towards goals.     Rehab Potential  Good    PT Frequency  3x / week    PT Duration  6 weeks    PT Treatment/Interventions  Patient/family education;ADLs/Self Care Home Management;Cryotherapy;Electrical Stimulation;Iontophoresis 37m/ml Dexamethasone;Moist Heat;Dry needling;Manual techniques;Neuromuscular re-education    PT Next Visit Plan  continue progressive Lt knee ROM, strengthening, stretching.     Consulted and Agree with Plan of Care  Patient       Patient will benefit from skilled therapeutic intervention in order to improve the following deficits and impairments:  Pain, Decreased mobility, Decreased range of motion, Decreased strength, Abnormal gait, Decreased activity tolerance  Visit Diagnosis: Acute pain of left knee  Muscle weakness (generalized)  Other abnormalities of gait and mobility  Localized edema     Problem List Patient Active Problem List   Diagnosis Date Noted  . History of total left knee replacement 02/10/2018  . Morbid obesity ([place]) 11/21/2017  . Primary osteoarthritis of left knee 09/22/2017  . Primary osteoarthritis of right knee 09/22/2017  . Attention deficit hyperactivity disorder (ADHD) 05/12/2016  . Episodic mood disorder ([place])  05/12/2016  . Type 2 diabetes mellitus without complication, without long-term current use of insulin ([place]) 05/12/2016  . Gastroesophageal reflux disease without esophagitis 05/12/2016  . DYSPNEA 07/22/2010  . CHEST PAIN 07/22/2010  . Hypothyroidism 09/13/2007  . Hyperlipidemia 09/13/2007  . ANXIETY DEPRESSION 09/13/2007  . MIGRAINE HEADACHE 09/13/2007  . Essential hypertension 09/13/2007  . ASTHMA 09/13/2007  . CHOLELITHIASIS, HX OF 09/13/2007  . NEPHROLITHIASIS, HX OF 09/13/2007  . CARPAL TUNNEL RELEASE, HX OF 09/13/2007   Kerin Perna, PTA 02/23/18 10:18 AM  [place], [place], [address] Phone: [phone]   Fax:  [phone]  Name: Judith [name] MRN: [id number] Date of Birth: December 30, 1970

## 2018-02-23 NOTE — Telephone Encounter (Signed)
Placed order for derm in [place]

## 2018-02-23 NOTE — Telephone Encounter (Signed)
Patient aware.

## 2018-02-23 NOTE — Progress Notes (Signed)
Derm

## 2018-02-23 NOTE — Patient Instructions (Signed)
HIP: Hamstrings - Supine  Place strap around foot. Raise leg up, keeping knee straight.  Bend opposite knee to protect back if indicated. Hold 30 seconds. 3 reps per set, 2-3 sets per day    Calf Stretch    Place hands on wall at shoulder height. Keeping back leg straight, bend front leg, feet pointing forward, heels flat on floor. Lean forward slightly until stretch is felt in calf of back leg. Hold stretch ___ seconds, breathing slowly in and out. Repeat stretch with other leg back. Do ___ sessions per day. Variation: Use chair or table for support.   Quads / HF, Prone KNEE: Quadriceps - Prone    Place strap around ankle. Bring ankle toward buttocks. Press hip into surface. Hold 30 seconds. Repeat 3 times per session. Do 2-3 sessions per day.

## 2018-02-25 ENCOUNTER — Ambulatory Visit (INDEPENDENT_AMBULATORY_CARE_PROVIDER_SITE_OTHER): Payer: BLUE CROSS/BLUE SHIELD | Admitting: Rehabilitative and Restorative Service Providers"

## 2018-02-25 ENCOUNTER — Encounter: Payer: Self-pay | Admitting: Rehabilitative and Restorative Service Providers"

## 2018-02-25 DIAGNOSIS — R2689 Other abnormalities of gait and mobility: Secondary | ICD-10-CM | POA: Diagnosis not present

## 2018-02-25 DIAGNOSIS — M25562 Pain in left knee: Secondary | ICD-10-CM

## 2018-02-25 DIAGNOSIS — R600 Localized edema: Secondary | ICD-10-CM

## 2018-02-25 DIAGNOSIS — M6281 Muscle weakness (generalized): Secondary | ICD-10-CM | POA: Diagnosis not present

## 2018-02-25 LAB — LYME AB/WESTERN BLOT REFLEX
LYME DISEASE AB, QUANT, IGM: <0.8 index (ref 0.00–0.79)
Lyme IgG/IgM Ab: <0.91 {ISR} (ref 0.00–0.90)

## 2018-02-25 NOTE — Patient Instructions (Addendum)
Strengthening: Hip Abductor - Resisted    With band looped around both legs above knees, hold one knee still and move opposite knee to the side hold 2-3 sec. Repeat __10__ times per set. Do _1-2___ sets per session. Do __1__ sessions per day.   Abduction: Clam (Eccentric) - Side-Lying    Lie on side with knees bent. Lift top knee, keeping feet together. Keep trunk steady. Slowly lower for 3-5 seconds. __10_ reps 1-2 sets 1 x day   Strengthening: Hip Adduction - Isometric    With ball or folded pillow between knees, squeeze knees together. Hold ___3_ seconds. Repeat _10___ times per set. Do __1-2__ sets per session. Do _1___ sessions per day.   Bridging with ball squeezed between knees  Bridge tensing green band around thighs

## 2018-02-25 NOTE — Therapy (Signed)
[place], [place], [address] Phone: [phone]   Fax:  [phone]  Physical Therapy Treatment  Patient Details  Name: Judith [name] MRN: [id number] Date of Birth: [birth year] Referring Provider (PT): Dr Herbie Baltimore Jones/Jeffrey Shaune Leeks PA-C    Encounter Date: 02/25/2018  PT End of Session - 02/25/18 0937    Visit Number  6    Number of Visits  18    Date for PT Re-Evaluation  03/14/18    PT Start Time  0931    PT Stop Time  1028    PT Time Calculation (min)  57 min    Activity Tolerance  Patient tolerated treatment well       Past Medical History:  Diagnosis Date  . Asthma   . Bipolar 1 disorder ([place])   . Depression   . GERD (gastroesophageal reflux disease)   . Hyperlipemia   . Hypothyroid   . Migraines     Past Surgical History:  Procedure Laterality Date  . ABDOMINAL SURGERY    . CARPAL TUNNEL RELEASE Bilateral 1999  . CESAREAN SECTION    . CHOLECYSTECTOMY    . THYROIDECTOMY    . TONSILLECTOMY      There were no vitals filed for this visit.  Subjective Assessment - 02/25/18 0938    Subjective  Patient reports that she is still having some pain in the knee but she is "hurting all over". She has been to the MD 3 times but does not have any answers. Not using her cane much - sometimes carries it but seldom uses the cane.    Currently in Pain?  Yes    Pain Score  3     Pain Location  Knee    Pain Orientation  Left    Pain Descriptors / Indicators  Sore    Pain Type  Surgical pain    Pain Onset  More than a month ago    Pain Frequency  Intermittent    Aggravating Factors   exercise; walking     Pain Relieving Factors  rest          [facility] PT Assessment - 02/25/18 0001      Assessment   Medical Diagnosis  Lt TKA    Referring Provider (PT)  Dr Herbie Baltimore Jones/Jeffrey Shaune Leeks PA-C     Onset Date/Surgical Date  01/20/18    Hand Dominance  Right    Next  MD Visit  around 03/21/18      AROM   Left Knee Extension  0    Left Knee Flexion  134                   [facility] Adult PT Treatment/Exercise - 02/25/18 0001      Knee/Hip Exercises: Stretches   Passive Hamstring Stretch  Left;3 reps;30 seconds   1 rep standing, 2 reps supine   Quad Stretch  Left;Right;5 reps;30 seconds   prone with strap   Gastroc Stretch  Left;Right;2 reps;30 seconds   runners stretch     Knee/Hip Exercises: Aerobic   Recumbent Bike  L3: 6 min     Other Aerobic  lap around gym in between exercises to decrease stiffness.       Knee/Hip Exercises: Standing   Lateral Step Up  Left;10 reps;Step Height: 6";Hand Hold: 0    Forward Step Up  Left;1 set;Hand Hold: 2;Step Height: 6";15 reps   improved   SLS  Lt/ Rt SLS x 30 sec blue pad with occasional UE to steady x 3each side     Walking with Sports Cord  backward/forward/Rt/Lt x 5 reps each direction       Knee/Hip Exercises: Supine   Quad Sets  Strengthening;Left;10 reps   5 sec hold    Bridges  Strengthening;Both;1 set;10 reps    Bridges with [employer]  Strengthening;Both;1 set;10 reps    Bridges with Clamshell  Strengthening;Both;1 set;10 reps   green TB   Other Supine Knee/Hip Exercises  clams green TB x 10 each LE       Knee/Hip Exercises: Sidelying   Clams  Rt clam x 10 reps green TB       Vasopneumatic   Number Minutes Vasopneumatic   15 minutes    Vasopnuematic Location   Knee   Lt   Vasopneumatic Pressure  Medium    Vasopneumatic Temperature   34 deg      Manual Therapy   Passive ROM  passive stretch into knee extension with overpressure 10 sec x 3              PT Education - 02/25/18 1024    Education Details  HEP     Person(s) Educated  Patient    Methods  Explanation;Demonstration;Tactile cues;Verbal cues;Handout    Comprehension  Verbalized understanding;Returned demonstration;Verbal cues required;Tactile cues required          PT Long Term Goals - 02/23/18 1015       PT LONG TERM GOAL #1   Title  Increase ROM Lt knee to 0 deg extensin - 120 deg flexion 03/14/18    Time  6    Period  Weeks    Status  Partially Met      PT LONG TERM GOAL #2   Title  5/5 strength Lt LE 03/14/18    Time  6    Period  Weeks    Status  Partially Met      PT LONG TERM GOAL #3   Title  Patient tolerating 30 min of standing and or walking; stepping up onto ladder 1-2 rungs to prepare for RTW 03/14/18    Time  6    Period  Weeks    Status  On-going      PT LONG TERM GOAL #4   Title  Independent in HEP 03/14/18    Time  6    Period  Weeks    Status  On-going      PT LONG TERM GOAL #5   Title  Improve FOTO to </= 51% limitation 03/14/18    Time  6    Period  Weeks    Status  On-going            Plan - 02/25/18 0937    Clinical Impression Statement  Continues to progress well with knee rehab. Working on ROM and strength; increasing functional activities. Goal is to return to work.    Rehab Potential  Good    PT Frequency  3x / week    PT Duration  6 weeks    PT Treatment/Interventions  Patient/family education;ADLs/Self Care Home Management;Cryotherapy;Electrical Stimulation;Iontophoresis 89m/ml Dexamethasone;Moist Heat;Dry needling;Manual techniques;Neuromuscular re-education    PT Next Visit Plan  continue progressive Lt knee ROM, strengthening, stretching.     Consulted and Agree with Plan of Care  Patient       Patient will benefit from skilled therapeutic intervention in order to improve the following deficits and impairments:  Pain, Decreased mobility, Decreased range of motion, Decreased strength, Abnormal gait, Decreased activity tolerance  Visit Diagnosis: Acute pain of left knee  Muscle weakness (generalized)  Other abnormalities of gait and mobility  Localized edema     Problem List Patient Active Problem List   Diagnosis Date Noted  . History of total left knee replacement 02/10/2018  . Morbid obesity ([place]) 11/21/2017  .  Primary osteoarthritis of left knee 09/22/2017  . Primary osteoarthritis of right knee 09/22/2017  . Attention deficit hyperactivity disorder (ADHD) 05/12/2016  . Episodic mood disorder ([place]) 05/12/2016  . Type 2 diabetes mellitus without complication, without long-term current use of insulin ([place]) 05/12/2016  . Gastroesophageal reflux disease without esophagitis 05/12/2016  . DYSPNEA 07/22/2010  . CHEST PAIN 07/22/2010  . Hypothyroidism 09/13/2007  . Hyperlipidemia 09/13/2007  . ANXIETY DEPRESSION 09/13/2007  . MIGRAINE HEADACHE 09/13/2007  . Essential hypertension 09/13/2007  . ASTHMA 09/13/2007  . CHOLELITHIASIS, HX OF 09/13/2007  . NEPHROLITHIASIS, HX OF 09/13/2007  . CARPAL TUNNEL RELEASE, HX OF 09/13/2007    Merna Baldi Nilda Simmer PT, MPH  02/25/2018, 10:33 AM  [facility] [place], [place], [address] Phone: [phone]   Fax:  [phone]  Name: Judith [name] MRN: [id number] Date of Birth: [birth year]

## 2018-03-01 ENCOUNTER — Telehealth: Payer: Self-pay | Admitting: Physician Assistant

## 2018-03-01 NOTE — Telephone Encounter (Signed)
Appointment given for 11/4

## 2018-03-02 ENCOUNTER — Ambulatory Visit (INDEPENDENT_AMBULATORY_CARE_PROVIDER_SITE_OTHER): Payer: BLUE CROSS/BLUE SHIELD | Admitting: Physical Therapy

## 2018-03-02 ENCOUNTER — Encounter: Payer: Self-pay | Admitting: Physical Therapy

## 2018-03-02 DIAGNOSIS — M25562 Pain in left knee: Secondary | ICD-10-CM | POA: Diagnosis not present

## 2018-03-02 DIAGNOSIS — R600 Localized edema: Secondary | ICD-10-CM

## 2018-03-02 DIAGNOSIS — M6281 Muscle weakness (generalized): Secondary | ICD-10-CM

## 2018-03-02 DIAGNOSIS — R2689 Other abnormalities of gait and mobility: Secondary | ICD-10-CM | POA: Diagnosis not present

## 2018-03-02 NOTE — Therapy (Signed)
[place], [place], [address] Phone: [phone]   Fax:  [phone]  Physical Therapy Treatment  Patient Details  Name: Judith [name] MRN: [id number] Date of Birth: 1970-09-14 Referring Provider (PT): Dr Herbie Baltimore Jones/Jeffrey Shaune Leeks PA-C    Encounter Date: 03/02/2018  PT End of Session - 03/02/18 1107    Visit Number  7    Number of Visits  18    Date for PT Re-Evaluation  03/14/18    PT Start Time  1105    PT Stop Time  1202    PT Time Calculation (min)  57 min    Equipment Utilized During Treatment  --    Activity Tolerance  Patient tolerated treatment well       Past Medical History:  Diagnosis Date  . Asthma   . Bipolar 1 disorder ([place])   . Depression   . GERD (gastroesophageal reflux disease)   . Hyperlipemia   . Hypothyroid   . Migraines     Past Surgical History:  Procedure Laterality Date  . ABDOMINAL SURGERY    . CARPAL TUNNEL RELEASE Bilateral 1999  . CESAREAN SECTION    . CHOLECYSTECTOMY    . THYROIDECTOMY    . TONSILLECTOMY      There were no vitals filed for this visit.  Subjective Assessment - 03/02/18 1109    Subjective  Pt reports she is starting to feel the lateral part of her Lt knee.  She feels the change in weather in her joints.  She plans to return to work in December. She plans to transition to gym in Nov after return to MD 11/4.     Patient Stated Goals  get leg moving and improve quality of life     Currently in Pain?  Yes    Pain Score  3     Pain Location  Knee    Pain Orientation  Left    Pain Descriptors / Indicators  Sore    Aggravating Factors   prolonged walking     Pain Relieving Factors  rest          [facility] PT Assessment - 03/02/18 0001      Assessment   Medical Diagnosis  Lt TKA    Referring Provider (PT)  Dr Herbie Baltimore Jones/Jeffrey Shaune Leeks PA-C     Onset Date/Surgical Date  01/20/18    Hand Dominance  Right     Next MD Visit  around 03/21/18       [facility] Adult PT Treatment/Exercise - 03/02/18 0001      Exercises   Exercises  Knee/Hip      Knee/Hip Exercises: Stretches   Passive Hamstring Stretch  Left;Right;2 reps;30 seconds    Quad Stretch  Left;2 reps;Right;4 reps;30 seconds    Gastroc Stretch  Left;Right;30 seconds;3 reps   runners stretch     Knee/Hip Exercises: Aerobic   Recumbent Bike  L2: 6 min    PTA present to monitor and discuss progress   Other Aerobic  lap around gym in between exercises to decrease stiffness.       Knee/Hip Exercises: Standing   Heel Raises  Both;1 set;10 reps   with reaching overhead with BUE   Lateral Step Up  Left;2 sets;5 reps;Hand Hold: 1;Step Height: 8"    Forward Step Up  Left;2 sets;5 reps;Hand Hold: 1;Step Height: 8"    Functional Squat  10 reps   with light bucket pick  up   SLS  SLS with ball toss to rebounder (1#) x 10, (5 reps on RLE)    Other Standing Knee Exercises  forward step up onto Bosu x 5, then balance x 15 sec with light UE support to steady;repeated on RLE.     Other Standing Knee Exercises  split squat with limited depth x 5 reps each leg (with unilteral UE support on counter)       Knee/Hip Exercises: Supine   Bridges with Clamshell  --      Vasopneumatic   Number Minutes Vasopneumatic   15 minutes    Vasopnuematic Location   Knee   Lt   Vasopneumatic Pressure  Medium    Vasopneumatic Temperature   34 deg         PT Long Term Goals - 02/23/18 1015      PT LONG TERM GOAL #1   Title  Increase ROM Lt knee to 0 deg extensin - 120 deg flexion 03/14/18    Time  6    Period  Weeks    Status  Partially Met      PT LONG TERM GOAL #2   Title  5/5 strength Lt LE 03/14/18    Time  6    Period  Weeks    Status  Partially Met      PT LONG TERM GOAL #3   Title  Patient tolerating 30 min of standing and or walking; stepping up onto ladder 1-2 rungs to prepare for RTW 03/14/18    Time  6    Period  Weeks    Status  On-going       PT LONG TERM GOAL #4   Title  Independent in HEP 03/14/18    Time  6    Period  Weeks    Status  On-going      PT LONG TERM GOAL #5   Title  Improve FOTO to </= 51% limitation 03/14/18    Time  6    Period  Weeks    Status  On-going            Plan - 03/02/18 1108    Clinical Impression Statement  Pt tolerated increased step height with minimal difficulty.  Added more challenging balance exercise today; instructed pt on performing SLS ball toss with her son.  Pt progressing well towards remaining goals.     Rehab Potential  Good    PT Frequency  3x / week    PT Duration  6 weeks    PT Treatment/Interventions  Patient/family education;ADLs/Self Care Home Management;Cryotherapy;Electrical Stimulation;Iontophoresis '4mg'$ /ml Dexamethasone;Moist Heat;Dry needling;Manual techniques;Neuromuscular re-education    PT Next Visit Plan  continue progressive Lt knee ROM, strengthening, stretching.     Consulted and Agree with Plan of Care  Patient       Patient will benefit from skilled therapeutic intervention in order to improve the following deficits and impairments:  Pain, Decreased mobility, Decreased range of motion, Decreased strength, Abnormal gait, Decreased activity tolerance  Visit Diagnosis: Acute pain of left knee  Muscle weakness (generalized)  Other abnormalities of gait and mobility  Localized edema     Problem List Patient Active Problem List   Diagnosis Date Noted  . History of total left knee replacement 02/10/2018  . Morbid obesity ([place]) 11/21/2017  . Primary osteoarthritis of left knee 09/22/2017  . Primary osteoarthritis of right knee 09/22/2017  . Attention deficit hyperactivity disorder (ADHD) 05/12/2016  . Episodic mood disorder ([place]) 05/12/2016  .  Type 2 diabetes mellitus without complication, without long-term current use of insulin ([place]) 05/12/2016  . Gastroesophageal reflux disease without esophagitis 05/12/2016  . DYSPNEA 07/22/2010  .  CHEST PAIN 07/22/2010  . Hypothyroidism 09/13/2007  . Hyperlipidemia 09/13/2007  . ANXIETY DEPRESSION 09/13/2007  . MIGRAINE HEADACHE 09/13/2007  . Essential hypertension 09/13/2007  . ASTHMA 09/13/2007  . CHOLELITHIASIS, HX OF 09/13/2007  . NEPHROLITHIASIS, HX OF 09/13/2007  . CARPAL TUNNEL RELEASE, HX OF 09/13/2007   Kerin Perna, PTA 03/02/18 12:01 PM  [facility] [place], [place], [address] Phone: [phone]   Fax:  [phone]  Name: Judith [name] MRN: [id number] Date of Birth: [birth year]

## 2018-03-03 ENCOUNTER — Encounter: Payer: BLUE CROSS/BLUE SHIELD | Admitting: Rehabilitative and Restorative Service Providers"

## 2018-03-03 DIAGNOSIS — L300 Nummular dermatitis: Secondary | ICD-10-CM | POA: Diagnosis not present

## 2018-03-03 DIAGNOSIS — L304 Erythema intertrigo: Secondary | ICD-10-CM | POA: Diagnosis not present

## 2018-03-03 DIAGNOSIS — L739 Follicular disorder, unspecified: Secondary | ICD-10-CM | POA: Diagnosis not present

## 2018-03-03 DIAGNOSIS — L508 Other urticaria: Secondary | ICD-10-CM | POA: Diagnosis not present

## 2018-03-03 NOTE — Addendum Note (Signed)
Addended by: Everardo All on: 03/03/2018 01:25 PM   Modules accepted: Orders

## 2018-03-04 ENCOUNTER — Ambulatory Visit (INDEPENDENT_AMBULATORY_CARE_PROVIDER_SITE_OTHER): Payer: BLUE CROSS/BLUE SHIELD | Admitting: Physical Therapy

## 2018-03-04 ENCOUNTER — Encounter: Payer: Self-pay | Admitting: Physical Therapy

## 2018-03-04 DIAGNOSIS — R600 Localized edema: Secondary | ICD-10-CM | POA: Diagnosis not present

## 2018-03-04 DIAGNOSIS — M6281 Muscle weakness (generalized): Secondary | ICD-10-CM | POA: Diagnosis not present

## 2018-03-04 DIAGNOSIS — R2689 Other abnormalities of gait and mobility: Secondary | ICD-10-CM | POA: Diagnosis not present

## 2018-03-04 DIAGNOSIS — M25562 Pain in left knee: Secondary | ICD-10-CM

## 2018-03-04 NOTE — Therapy (Signed)
[facility] [address] [place], [place], [address] Phone: [phone]   Fax:  [phone]  Physical Therapy Treatment  Patient Details  Name: Judith [name] MRN: [id number] Date of Birth: 09/27/1970 Referring Provider (PT): Dr Robert Benjamin Jones/Jeffrey Dee Vecchio PA-C    Encounter Date: 03/04/2018  PT End of Session - 03/04/18 1104    Visit Number  8    Number of Visits  18    Date for PT Re-Evaluation  03/14/18    PT Start Time  1101    PT Stop Time  1156    PT Time Calculation (min)  55 min    Activity Tolerance  Patient tolerated treatment well    Behavior During Therapy  WFL for tasks assessed/performed       Past Medical History:  Diagnosis Date  . Asthma   . Bipolar 1 disorder ([facility])   . Depression   . GERD (gastroesophageal reflux disease)   . Hyperlipemia   . Hypothyroid   . Migraines     Past Surgical History:  Procedure Laterality Date  . ABDOMINAL SURGERY    . CARPAL TUNNEL RELEASE Bilateral 1999  . CESAREAN SECTION    . CHOLECYSTECTOMY    . THYROIDECTOMY    . TONSILLECTOMY      There were no vitals filed for this visit.  Subjective Assessment - 03/04/18 1104    Subjective  "I wanna make sure I'm ready to go back to work".   She has the step height of ladders at work so that she can work on it here in therapy.     Patient Stated Goals  get leg moving and improve quality of life     Currently in Pain?  Yes    Pain Score  3     Pain Location  Generalized    Pain Descriptors / Indicators  Aching         [facility] PT Assessment - 03/04/18 0001      Assessment   Medical Diagnosis  Lt TKA    Referring Provider (PT)  Dr Robert Benjamin Jones/Jeffrey Dee Vecchio PA-C     Onset Date/Surgical Date  01/20/18    Hand Dominance  Right    Next MD Visit  around 03/21/18          [facility] Adult PT Treatment/Exercise - 03/04/18 0001      Knee/Hip Exercises: Stretches   Passive Hamstring Stretch   Left;Right;2 reps;30 seconds    Quad Stretch  Right;Left;2 reps;30 seconds   prone with strap   Gastroc Stretch  Both;2 reps;30 seconds   slant board     Knee/Hip Exercises: Aerobic   Nustep  L5: 7 min, arms/legs     Other Aerobic  lap around gym in between exercises to decrease stiffness.       Knee/Hip Exercises: Standing   Heel Raises  Both;1 set;10 reps   with reaching overhead with BUE for 3# sack   Lateral Step Up  Left;Hand Hold: 1;Step Height: 8";1 set;10 reps    SLS  SLS forward leans to touch chair seat x 5 each leg    Other Standing Knee Exercises  forward step up onto 10" step x 2 reps (challenging) - moved to 8" step with unilateral support x 10.  Forward step up onto 12" step (2nd step of staircase) with BUE support x 5 reps     Other Standing Knee Exercises  box lift (16#) from pallet to   waist height x 5 reps       Knee/Hip Exercises: Seated   Sit to Sand  1 set;without UE support   without stopping to rest in seated position; black mat     Vasopneumatic   Number Minutes Vasopneumatic   15 minutes    Vasopnuematic Location   Knee   Lt   Vasopneumatic Pressure  Medium    Vasopneumatic Temperature   34 deg                  PT Long Term Goals - 03/04/18 1154      PT LONG TERM GOAL #1   Title  Increase ROM Lt knee to 0 deg extension - 120 deg flexion 03/14/18    Time  6    Period  Weeks    Status  Achieved      PT LONG TERM GOAL #2   Title  5/5 strength Lt LE 03/14/18    Time  6    Period  Weeks    Status  Partially Met      PT LONG TERM GOAL #3   Title  Patient tolerating 30 min of standing and or walking; stepping up onto ladder 1-2 rungs to prepare for RTW 03/14/18    Time  6    Period  Weeks    Status  On-going      PT LONG TERM GOAL #4   Title  Independent in HEP 03/14/18    Time  6    Period  Weeks    Status  On-going      PT LONG TERM GOAL #5   Title  Improve FOTO to </= 51% limitation 03/14/18    Time  6    Period  Weeks     Status  On-going            Plan - 03/04/18 1137    Clinical Impression Statement  More job simulated exercises added today, ie: box lift, 12" step ups, to assist with return to work.  She had some difficulty with the 10" step with unilateral support, but tolerated 12" step with bilateral support much better.  Pt progressing well towards remaining goals.     Rehab Potential  Good    PT Frequency  3x / week    PT Duration  6 weeks    PT Treatment/Interventions  Patient/family education;ADLs/Self Care Home Management;Cryotherapy;Electrical Stimulation;Iontophoresis 4mg/ml Dexamethasone;Moist Heat;Dry needling;Manual techniques;Neuromuscular re-education    PT Next Visit Plan  POC ends 10/28 - reassess on 10/23 appt.  continue progressive Lt knee strengthening and job simulated exercises.     Consulted and Agree with Plan of Care  Patient       Patient will benefit from skilled therapeutic intervention in order to improve the following deficits and impairments:  Pain, Decreased mobility, Decreased range of motion, Decreased strength, Abnormal gait, Decreased activity tolerance  Visit Diagnosis: Acute pain of left knee  Muscle weakness (generalized)  Other abnormalities of gait and mobility  Localized edema     Problem List Patient Active Problem List   Diagnosis Date Noted  . History of total left knee replacement 02/10/2018  . Morbid obesity ([facility]) 11/21/2017  . Primary osteoarthritis of left knee 09/22/2017  . Primary osteoarthritis of right knee 09/22/2017  . Attention deficit hyperactivity disorder (ADHD) 05/12/2016  . Episodic mood disorder ([facility]) 05/12/2016  . Type 2 diabetes mellitus without complication, without long-term current use of insulin ([facility]) 05/12/2016  . Gastroesophageal reflux   disease without esophagitis 05/12/2016  . DYSPNEA 07/22/2010  . CHEST PAIN 07/22/2010  . Hypothyroidism 09/13/2007  . Hyperlipidemia 09/13/2007  . ANXIETY DEPRESSION 09/13/2007  .  MIGRAINE HEADACHE 09/13/2007  . Essential hypertension 09/13/2007  . ASTHMA 09/13/2007  . CHOLELITHIASIS, HX OF 09/13/2007  . NEPHROLITHIASIS, HX OF 09/13/2007  . CARPAL TUNNEL RELEASE, HX OF 09/13/2007   Jennifer Carlson-Long, PTA 03/04/18 11:54 AM  [facility] [address] [place], [place], [address] Phone: [phone]   Fax:  [phone]  Name: Madilyne R Rosamilia MRN: [id number] Date of Birth: 07/18/1970   

## 2018-03-07 ENCOUNTER — Ambulatory Visit (INDEPENDENT_AMBULATORY_CARE_PROVIDER_SITE_OTHER): Payer: BLUE CROSS/BLUE SHIELD | Admitting: Physical Therapy

## 2018-03-07 DIAGNOSIS — M25562 Pain in left knee: Secondary | ICD-10-CM | POA: Diagnosis not present

## 2018-03-07 DIAGNOSIS — R600 Localized edema: Secondary | ICD-10-CM

## 2018-03-07 DIAGNOSIS — R2689 Other abnormalities of gait and mobility: Secondary | ICD-10-CM

## 2018-03-07 DIAGNOSIS — M6281 Muscle weakness (generalized): Secondary | ICD-10-CM | POA: Diagnosis not present

## 2018-03-07 NOTE — Therapy (Signed)
[place], [place], [address] Phone: [phone]   Fax:  [phone]  Physical Therapy Treatment  Patient Details  Name: Judith [name] MRN: [id number] Date of Birth: Aug 11, 1970 Referring Provider (PT): Dr Herbie Baltimore Jones/Jeffrey Shaune Leeks PA-C    Encounter Date: 03/07/2018  PT End of Session - 03/07/18 1423    Visit Number  9    Number of Visits  18    Date for PT Re-Evaluation  03/14/18    PT Start Time  1101    PT Stop Time  1155    PT Time Calculation (min)  54 min    Activity Tolerance  Patient tolerated treatment well    Behavior During Therapy  [facility] for tasks assessed/performed       Past Medical History:  Diagnosis Date  . Asthma   . Bipolar 1 disorder ([place])   . Depression   . GERD (gastroesophageal reflux disease)   . Hyperlipemia   . Hypothyroid   . Migraines     Past Surgical History:  Procedure Laterality Date  . ABDOMINAL SURGERY    . CARPAL TUNNEL RELEASE Bilateral 1999  . CESAREAN SECTION    . CHOLECYSTECTOMY    . THYROIDECTOMY    . TONSILLECTOMY      There were no vitals filed for this visit.  Subjective Assessment - 03/07/18 1420    Subjective  Pt relays her Lt knee is doing good, but her Rt knee is bothering her some and she will need Rt TKA next year.    Currently in Pain?  No/denies                       [facility] Adult PT Treatment/Exercise - 03/07/18 1420      Exercises   Exercises  Knee/Hip      Knee/Hip Exercises: Stretches   Passive Hamstring Stretch  Left;Right;2 reps;30 seconds    Quad Stretch  Right;Left;2 reps;30 seconds   prone with strap   Gastroc Stretch  Both;2 reps;30 seconds   slant board     Knee/Hip Exercises: Aerobic   Recumbent Bike  L3 5 min    Nustep  --    Other Aerobic  lap around gym in between exercises to decrease stiffness.       Knee/Hip Exercises: Standing   Heel Raises  Both;1 set;10 reps   with  reaching overhead with BUE for 3# sack   Forward Lunges  Right;Left;1 set;10 reps   UE assistance, partial ROM   Lateral Step Up  Left;Hand Hold: 1;1 set;15 reps;Step Height: 6"    Step Down  Right;Left;1 set;10 reps;Step Height: 6";Hand Hold: 2    SLS  SLS forward leans to touch chair seat x 5 each leg    Other Standing Knee Exercises  Forward step up onto 12" step (2nd step of staircase) with BUE support 2x 5 reps     Other Standing Knee Exercises  box lift (16#) from pallet to waist height x 5 reps, SLS with UE reaches/hip hinge to chair X 8 each leg      Knee/Hip Exercises: Seated   Sit to Sand  without UE support;2 sets;10 reps   without stopping to rest in seated position; black mat     Vasopneumatic   Number Minutes Vasopneumatic   15 minutes    Vasopnuematic Location   Knee   Lt   Vasopneumatic Pressure  Medium  Vasopneumatic Temperature   34 deg                  PT Long Term Goals - 03/04/18 1154      PT LONG TERM GOAL #1   Title  Increase ROM Lt knee to 0 deg extension - 120 deg flexion 03/14/18    Time  6    Period  Weeks    Status  Achieved      PT LONG TERM GOAL #2   Title  5/5 strength Lt LE 03/14/18    Time  6    Period  Weeks    Status  Partially Met      PT LONG TERM GOAL #3   Title  Patient tolerating 30 min of standing and or walking; stepping up onto ladder 1-2 rungs to prepare for RTW 03/14/18    Time  6    Period  Weeks    Status  On-going      PT LONG TERM GOAL #4   Title  Independent in HEP 03/14/18    Time  6    Period  Weeks    Status  On-going      PT LONG TERM GOAL #5   Title  Improve FOTO to </= 51% limitation 03/14/18    Time  6    Period  Weeks    Status  On-going            Plan - 03/07/18 1424    Clinical Impression Statement  Pt was able to increase her functional strengthening program today adding in lunges and step downs as well as progressing reps with step ups and sit to stands today with good tolerance  and no complaints from left knee. Pt will continue to progress work related strengthening as able.     Rehab Potential  Good    PT Frequency  3x / week    PT Duration  6 weeks    PT Next Visit Plan  POC ends 10/28 - reassess on 10/23 appt.  continue progressive Lt knee strengthening and job simulated exercises.     Consulted and Agree with Plan of Care  Patient       Patient will benefit from skilled therapeutic intervention in order to improve the following deficits and impairments:  Pain, Decreased mobility, Decreased range of motion, Decreased strength, Abnormal gait, Decreased activity tolerance  Visit Diagnosis: Acute pain of left knee  Muscle weakness (generalized)  Other abnormalities of gait and mobility  Localized edema     Problem List Patient Active Problem List   Diagnosis Date Noted  . History of total left knee replacement 02/10/2018  . Morbid obesity ([place]) 11/21/2017  . Primary osteoarthritis of left knee 09/22/2017  . Primary osteoarthritis of right knee 09/22/2017  . Attention deficit hyperactivity disorder (ADHD) 05/12/2016  . Episodic mood disorder ([place]) 05/12/2016  . Type 2 diabetes mellitus without complication, without long-term current use of insulin ([place]) 05/12/2016  . Gastroesophageal reflux disease without esophagitis 05/12/2016  . DYSPNEA 07/22/2010  . CHEST PAIN 07/22/2010  . Hypothyroidism 09/13/2007  . Hyperlipidemia 09/13/2007  . ANXIETY DEPRESSION 09/13/2007  . MIGRAINE HEADACHE 09/13/2007  . Essential hypertension 09/13/2007  . ASTHMA 09/13/2007  . CHOLELITHIASIS, HX OF 09/13/2007  . NEPHROLITHIASIS, HX OF 09/13/2007  . CARPAL TUNNEL RELEASE, HX OF 09/13/2007    Debbe Odea, PT, DPT 03/07/2018, 2:28 PM  [address] [address] [place], [place],  [place] Phone: [phone]   Fax:  [phone]  Name: Judith [name] MRN: [id number] Date of Birth: 07-17-1970

## 2018-03-09 ENCOUNTER — Encounter: Payer: BLUE CROSS/BLUE SHIELD | Admitting: Rehabilitative and Restorative Service Providers"

## 2018-03-10 ENCOUNTER — Ambulatory Visit
Admit: 2018-03-10 | Discharge: 2018-03-10 | Payer: PRIVATE HEALTH INSURANCE | Attending: Family Medicine | Primary: Family Medicine

## 2018-03-10 ENCOUNTER — Institutional Professional Consult (permissible substitution): Primary: Family Medicine

## 2018-03-10 ENCOUNTER — Ambulatory Visit: Attending: Family Medicine | Primary: Family Medicine

## 2018-03-10 DIAGNOSIS — N3000 Acute cystitis without hematuria: Secondary | ICD-10-CM

## 2018-03-10 LAB — AMB POC URINALYSIS DIP STICK AUTO W/O MICRO
Bilirubin (UA POC): NEGATIVE
Bilirubin, Urine, POC: NEGATIVE
Blood (UA POC): NEGATIVE
Blood (UA POC): NEGATIVE
Glucose (UA POC): NEGATIVE
Glucose, Urine, POC: NEGATIVE
Ketones (UA POC): NEGATIVE
Ketones, Urine, POC: NEGATIVE
Nitrite, Urine, POC: NEGATIVE
Nitrites (UA POC): NEGATIVE
Protein (UA POC): NEGATIVE
Protein, Urine, POC: NEGATIVE
Specific Gravity, Urine, POC: 1.025 NA (ref 1.010–1.025)
Specific gravity (UA POC): 1.025 (ref 1.010–1.025)
Urobilinogen (UA POC): 0.2 (ref 0.2–1)
Urobilinogen, POC: 0.2 (ref 0.2–1)
pH (UA POC): 5.5 (ref 5.0–7.0)
pH, Urine, POC: 5.5 NA (ref 5.0–7.0)

## 2018-03-10 MED ORDER — CEFUROXIME AXETIL 500 MG TAB
500 mg | ORAL_TABLET | Freq: Two times a day (BID) | ORAL | 0 refills | Status: AC
Start: 2018-03-10 — End: 2018-03-15

## 2018-03-10 MED ORDER — CEFUROXIME AXETIL 500 MG TAB
500 mg | ORAL_TABLET | Freq: Two times a day (BID) | ORAL | 0 refills | Status: DC
Start: 2018-03-10 — End: 2018-03-10

## 2018-03-10 NOTE — Progress Notes (Signed)
HISTORY OF PRESENT ILLNESS  Sara Cooke is a 47 y.o. female.  Bladder Infection    The history is provided by the patient. This is a new problem. Episode onset: 6 days. The problem has not changed since onset.The quality of the pain is described as burning. The pain is mild.   Says TD few yrs ago.  She was scratched and bitten by the cat at the residents she is staying here.    Review of Systems   Skin:        Bitten by cat yesterday onto right hand     Allergies   Allergen Reactions   ??? Percocet [Oxycodone-Acetaminophen] Nausea and Vomiting   ??? Percocet [Oxycodone-Acetaminophen] Nausea and Vomiting   ??? Wellbutrin [Bupropion Hcl] Itching   ??? Wellbutrin [Bupropion Hcl] Itching       Physical Exam     Visit Vitals  BP 132/83 (BP 1 Location: Left arm, BP Patient Position: Sitting)   Pulse 90   Temp 97.5 ??F (36.4 ??C) (Oral)   Resp 14   Ht 5\' 3"  (1.6 m)   Wt 183 lb (83 kg)   SpO2 97%   BMI 32.42 kg/m??       Right hand thenar eminece 1 cm bite wound ,min redness surrounding  Urine analysis shows trace leukocytes.  ASSESSMENT and PLAN      ICD-10-CM ICD-9-CM    1. Acute cystitis without hematuria N30.00 595.0 AMB POC URINALYSIS DIP STICK AUTO W/ MICRO      cefUROXime (CEFTIN) 500 mg tablet   2. Cat bite of hand, right, initial encounter S61.451A 882.0     W55.01XA E906.3        Orders Placed This Encounter   ??? CULTURE, URINE   ??? AMB POC URINALYSIS DIP STICK AUTO W/ MICRO   ??? cefUROXime (CEFTIN) 500 mg tablet     Sig: Take 1 Tab by mouth two (2) times a day for 5 days.     Dispense:  10 Tab     Refill:  0         Follow-up and Dispositions    ?? Return if symptoms worsen or fail to improve.

## 2018-03-10 NOTE — Progress Notes (Signed)
HISTORY OF PRESENT ILLNESS  Sara Cooke is a 47 y.o. female.  Bladder Infection    The history is provided by the patient. This is a new problem. Episode onset: 6 days. The problem has not changed since onset.The quality of the pain is described as burning. The pain is mild.   Says TD few yrs ago.  She was scratched and bitten by the cat at the residents she is staying here.    Review of Systems   Skin:        Bitten by cat yesterday onto right hand     Allergies   Allergen Reactions   ??? Percocet [Oxycodone-Acetaminophen] Nausea and Vomiting   ??? Percocet [Oxycodone-Acetaminophen] Nausea and Vomiting   ??? Wellbutrin [Bupropion Hcl] Itching   ??? Wellbutrin [Bupropion Hcl] Itching       Physical Exam     Visit Vitals  BP 132/83 (BP 1 Location: Left arm, BP Patient Position: Sitting)   Pulse 90   Temp 97.5 ??F (36.4 ??C) (Oral)   Resp 14   Ht 5' 3" (1.6 m)   Wt 183 lb (83 kg)   SpO2 97%   BMI 32.42 kg/m??       Right hand thenar eminece 1 cm bite wound ,min redness surrounding  Urine analysis shows trace leukocytes.  ASSESSMENT and PLAN      ICD-10-CM ICD-9-CM    1. Acute cystitis without hematuria N30.00 595.0 AMB POC URINALYSIS DIP STICK AUTO W/ MICRO      cefUROXime (CEFTIN) 500 mg tablet   2. Cat bite of hand, right, initial encounter S61.451A 882.0     W55.01XA E906.3        Orders Placed This Encounter   ??? CULTURE, URINE   ??? AMB POC URINALYSIS DIP STICK AUTO W/ MICRO   ??? cefUROXime (CEFTIN) 500 mg tablet     Sig: Take 1 Tab by mouth two (2) times a day for 5 days.     Dispense:  10 Tab     Refill:  0         Follow-up and Dispositions    ?? Return if symptoms worsen or fail to improve.

## 2018-03-10 NOTE — Progress Notes (Signed)
No growth

## 2018-03-10 NOTE — Progress Notes (Signed)
We can see what the culture shows.

## 2018-03-13 LAB — CULTURE, URINE
Urine Culture, Routine: NO GROWTH
Urine Culture, Routine: NO GROWTH

## 2018-03-14 ENCOUNTER — Inpatient Hospital Stay
Admit: 2018-03-14 | Discharge: 2018-03-14 | Disposition: A | Discharge status: Home / Self Care | Payer: PRIVATE HEALTH INSURANCE | Attending: Emergency Medicine

## 2018-03-14 ENCOUNTER — Emergency Department: Admit: 2018-03-14 | Payer: PRIVATE HEALTH INSURANCE | Primary: Family Medicine

## 2018-03-14 ENCOUNTER — Encounter: Payer: BLUE CROSS/BLUE SHIELD | Admitting: Physical Therapy

## 2018-03-14 DIAGNOSIS — S42252A Displaced fracture of greater tuberosity of left humerus, initial encounter for closed fracture: Secondary | ICD-10-CM

## 2018-03-14 MED ORDER — PROPOFOL 10 MG/ML IV EMUL
10 mg/mL | INTRAVENOUS | Status: AC
Start: 2018-03-14 — End: 2018-03-14
  Administered 2018-03-14: 13:00:00 via INTRAVENOUS

## 2018-03-14 MED ORDER — IBUPROFEN 600 MG TAB
600 mg | ORAL_TABLET | ORAL | 0 refills | Status: AC
Start: 2018-03-14 — End: 2018-03-28

## 2018-03-14 MED ORDER — HYDROCODONE-ACETAMINOPHEN 5 MG-325 MG TAB
5-325 mg | ORAL_TABLET | Freq: Four times a day (QID) | ORAL | 0 refills | Status: AC | PRN
Start: 2018-03-14 — End: 2018-03-17

## 2018-03-14 MED ORDER — SODIUM CHLORIDE 0.9% BOLUS IV
0.9 % | Freq: Once | INTRAVENOUS | Status: AC
Start: 2018-03-14 — End: 2018-03-14
  Administered 2018-03-14: 13:00:00 via INTRAVENOUS

## 2018-03-14 MED ORDER — FENTANYL CITRATE (PF) 50 MCG/ML IJ SOLN
50 mcg/mL | INTRAMUSCULAR | Status: AC
Start: 2018-03-14 — End: 2018-03-14
  Administered 2018-03-14: 13:00:00 via INTRAVENOUS

## 2018-03-14 MED ORDER — KETOROLAC TROMETHAMINE 30 MG/ML INJECTION
30 mg/mL (1 mL) | INTRAMUSCULAR | Status: AC
Start: 2018-03-14 — End: 2018-03-14
  Administered 2018-03-14: 12:00:00 via INTRAMUSCULAR

## 2018-03-14 MED FILL — SODIUM CHLORIDE 0.9 % IV: INTRAVENOUS | Qty: 1000

## 2018-03-14 MED FILL — KETOROLAC TROMETHAMINE 30 MG/ML INJECTION: 30 mg/mL (1 mL) | INTRAMUSCULAR | Qty: 2

## 2018-03-14 MED FILL — FENTANYL CITRATE (PF) 50 MCG/ML IJ SOLN: 50 mcg/mL | INTRAMUSCULAR | Qty: 2

## 2018-03-14 MED FILL — DIPRIVAN 10 MG/ML INTRAVENOUS EMULSION: 10 mg/mL | INTRAVENOUS | Qty: 20

## 2018-03-14 NOTE — ED Notes (Signed)
Xray at bedside

## 2018-03-14 NOTE — Procedures (Signed)
PROCEDURE NOTE - FRACTURE REDUCTION: The patient was found to have a fracture dislocation of left shoulder in need of reduction. Risks and benefits of closed reduction under sedation were discussed with the patient who agreed to proceed. Consents signed and on chart. Patient was induced with propofol for procedrual sedation via the emergency department MD. After it was confirmed that appropriate sedation had been reached, a longitudinal traction maneuver with external rotation of arm was applied with arm abduction. This was eventually felt to reduce and patient palpably had decreased deformity and appeared symmetric to the right side. Imaging was obtained showing successful reduction with adequate alignment of fracture pieces. Patient was placed in shoulder immobilizing sling. Patient tolerated procedure well.     Sara Dobie M Iara Monds, PA-C  Orthopedic Trauma Service  [facility]
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
## 2018-03-14 NOTE — ED Notes (Signed)
Pt placed on monitor x 3. O2 equipment ready at bedside.
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
## 2018-03-14 NOTE — ED Triage Notes (Signed)
Pt arrives after a trip and fall this morning over a sprinkler resulting in pain to L shoulder. Worse with movement. Denies hitting head or LOC.

## 2018-03-14 NOTE — ED Notes (Signed)
Ice applied to patients left shoulder.

## 2018-03-14 NOTE — ED Notes (Signed)
Ice applied to patients left shoulder.

## 2018-03-14 NOTE — Procedures (Signed)
PROCEDURE NOTE - FRACTURE REDUCTION: The patient was found to have a fracture dislocation of left shoulder in need of reduction. Risks and benefits of closed reduction under sedation were discussed with the patient who agreed to proceed. Consents signed and on chart. Patient was induced with propofol for procedrual sedation via the emergency department MD. After it was confirmed that appropriate sedation had been reached, a longitudinal traction maneuver with external rotation of arm was applied with arm abduction. This was eventually felt to reduce and patient palpably had decreased deformity and appeared symmetric to the right side. Imaging was obtained showing successful reduction with adequate alignment of fracture pieces. Patient was placed in shoulder immobilizing sling. Patient tolerated procedure well.     Harle Stanford, PA-C  Orthopedic Trauma Service  [facility]
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
## 2018-03-14 NOTE — ED Notes (Signed)
Pt arrives after a trip and fall this morning over a sprinkler resulting in pain to L shoulder. Worse with movement. Denies hitting head or LOC.

## 2018-03-14 NOTE — ED Notes (Signed)
Pt placed on monitor x 3. O2 equipment ready at bedside.

## 2018-03-16 ENCOUNTER — Ambulatory Visit (INDEPENDENT_AMBULATORY_CARE_PROVIDER_SITE_OTHER): Payer: BLUE CROSS/BLUE SHIELD | Admitting: Physical Therapy

## 2018-03-16 ENCOUNTER — Encounter: Payer: Self-pay | Admitting: Physical Therapy

## 2018-03-16 DIAGNOSIS — M25562 Pain in left knee: Secondary | ICD-10-CM | POA: Diagnosis not present

## 2018-03-16 DIAGNOSIS — M6281 Muscle weakness (generalized): Secondary | ICD-10-CM | POA: Diagnosis not present

## 2018-03-16 DIAGNOSIS — R2689 Other abnormalities of gait and mobility: Secondary | ICD-10-CM

## 2018-03-16 DIAGNOSIS — R600 Localized edema: Secondary | ICD-10-CM

## 2018-03-16 NOTE — Therapy (Addendum)
[facility] [place] [address] [place], [place], [address] Phone: [phone]   Fax:  [phone]  Physical Therapy Treatment/Discharge  Patient Details  Name: Judith [name] MRN: [id number] Date of Birth: [birth year] Referring Provider (PT): Dr Herbie Baltimore Jones/Jeffrey Shaune Leeks PA-C    Encounter Date: 03/16/2018  PT End of Session - 03/16/18 1213    Visit Number  10    Number of Visits  15    Date for PT Re-Evaluation  04/13/18    PT Start Time  1115    PT Stop Time  1158    PT Time Calculation (min)  43 min    Activity Tolerance  Patient tolerated treatment well    Behavior During Therapy  [facility] for tasks assessed/performed       Past Medical History:  Diagnosis Date  . Asthma   . Bipolar 1 disorder ([place])   . Depression   . GERD (gastroesophageal reflux disease)   . Hyperlipemia   . Hypothyroid   . Migraines     Past Surgical History:  Procedure Laterality Date  . ABDOMINAL SURGERY    . CARPAL TUNNEL RELEASE Bilateral 1999  . CESAREAN SECTION    . CHOLECYSTECTOMY    . THYROIDECTOMY    . TONSILLECTOMY      There were no vitals filed for this visit.  Subjective Assessment - 03/16/18 1119    Subjective  "this knee is killing me." pt pointing to Rt knee, Lt knee is doing well.  wasn't feeling well last week (depression) but doing better now.    Pertinent History  thyroid removed; gall bladder removed; arthritis; c-section; carla tunnel release bilat; hx of anxiety/depression; female surgery    How long can you walk comfortably?  30 min    Patient Stated Goals  get leg moving and improve quality of life     Pain Score  1     Pain Location  Knee    Pain Orientation  Left    Pain Descriptors / Indicators  Tingling    Pain Relieving Factors  nerves regenerating along lateral incision         [facility] PT Assessment - 03/16/18 1135      Assessment   Medical Diagnosis  Lt TKA    Referring Provider (PT)  Dr  Herbie Baltimore Jones/Jeffrey Shaune Leeks PA-C     Onset Date/Surgical Date  01/20/18    Next MD Visit  around 03/21/18      Observation/Other Assessments   Focus on Therapeutic Outcomes (FOTO)   48 (52% limited)      Functional Tests   Functional tests  Single leg stance      Single Leg Stance   Comments  LLE: 15 sec on level ground; compliant surface 6-10 sec with increased sway      Strength   Left Hip Extension  5/5    Left Hip ABduction  5/5                   [facility] Adult PT Treatment/Exercise - 03/16/18 1121      Self-Care   Self-Care  Other Self-Care Comments    Other Self-Care Comments   discussed current progress and POC; as well as goals of PT moving forward.  Pt verbalized understanding.  Encouraged pt to perform HEP on both Rt and Lt to improve strength and function on Rt knee in preparation of Rt TKA next year.  Knee/Hip Exercises: Aerobic   Recumbent Bike  L2 8 min      Knee/Hip Exercises: Standing   Heel Raises  Left;10 reps   1 UE support   SLS  LLE with RLE movement forward/lateral/back x 5 reps each             PT Education - 03/16/18 1212    Education Details  see self care; HEP    Person(s) Educated  Patient    Methods  Explanation;Demonstration;Handout    Comprehension  Verbalized understanding;Returned demonstration          PT Long Term Goals - 03/16/18 1214      PT LONG TERM GOAL #1   Title  Increase ROM Lt knee to 0 deg extension - 120 deg flexion 03/14/18    Time  6    Period  Weeks    Status  Achieved      PT LONG TERM GOAL #2   Title  5/5 strength Lt LE 03/14/18    Time  6    Period  Weeks    Status  Achieved      PT LONG TERM GOAL #3   Title  Patient tolerating 30 min of standing and or walking; stepping up onto ladder 1-2 rungs to prepare for RTW 03/14/18    Time  4    Period  Weeks    Status  On-going    Target Date  04/13/18      PT LONG TERM GOAL #4   Title  Independent in HEP 03/14/18    Time  4     Period  Weeks    Status  On-going    Target Date  04/13/18      PT LONG TERM GOAL #5   Title  Improve FOTO to </= 51% limitation 03/14/18    Baseline  10/30: 52% limited    Time  4    Period  Weeks    Status  On-going    Target Date  04/13/18      PT LONG TERM GOAL #6   Title  perform LLE SLS on compliant surface > 15 sec for improved balance and strength    Status  New    Target Date  04/13/18            Plan - 03/16/18 1216    Clinical Impression Statement  Pt presents today with improved functional mobility and FOTO score.  Pt continues to demonstrate high level balance limitations affecting safety at work.  Recommend PT 1x/wk x 4 weeks to maximize strength and balance in preparation to return to work.    Rehab Potential  Good    PT Frequency  3x / week    PT Duration  6 weeks    PT Next Visit Plan  continue progressive Lt knee strengthening and job simulated exercises.     Consulted and Agree with Plan of Care  Patient       Patient will benefit from skilled therapeutic intervention in order to improve the following deficits and impairments:  Pain, Decreased mobility, Decreased range of motion, Decreased strength, Abnormal gait, Decreased activity tolerance  Visit Diagnosis: Acute pain of left knee - Plan: PT plan of care cert/re-cert  Muscle weakness (generalized) - Plan: PT plan of care cert/re-cert  Other abnormalities of gait and mobility - Plan: PT plan of care cert/re-cert  Localized edema - Plan: PT plan of care cert/re-cert     Problem List Patient Active  Problem List   Diagnosis Date Noted  . History of total left knee replacement 02/10/2018  . Morbid obesity ([place]) 11/21/2017  . Primary osteoarthritis of left knee 09/22/2017  . Primary osteoarthritis of right knee 09/22/2017  . Attention deficit hyperactivity disorder (ADHD) 05/12/2016  . Episodic mood disorder ([place]) 05/12/2016  . Type 2 diabetes mellitus without complication, without  long-term current use of insulin ([place]) 05/12/2016  . Gastroesophageal reflux disease without esophagitis 05/12/2016  . DYSPNEA 07/22/2010  . CHEST PAIN 07/22/2010  . Hypothyroidism 09/13/2007  . Hyperlipidemia 09/13/2007  . ANXIETY DEPRESSION 09/13/2007  . MIGRAINE HEADACHE 09/13/2007  . Essential hypertension 09/13/2007  . ASTHMA 09/13/2007  . CHOLELITHIASIS, HX OF 09/13/2007  . NEPHROLITHIASIS, HX OF 09/13/2007  . CARPAL TUNNEL RELEASE, HX OF 09/13/2007      Laureen Abrahams, PT, DPT 03/16/18 12:19 PM      [facility] [address] [place], [place], [address] Phone: [phone]   Fax:  [phone]  Name: JONESHA TSUCHIYA MRN: [id number] Date of Birth: 02-09-1971     PHYSICAL THERAPY DISCHARGE SUMMARY  Visits from Start of Care: 10   Current functional level related to goals / functional outcomes: See above    Remaining deficits: Unknown, pt did not return    Education / Equipment: HEP  Plan: Patient agrees to discharge.  Patient goals were not met. Patient is being discharged due to not returning since the last visit.  ?????     Laureen Abrahams, PT, DPT 04/19/18 2:38 PM  [facility] [address] [place], [place] [address]  [phone] (office) [phone] (fax)

## 2018-03-16 NOTE — Patient Instructions (Signed)
Access Code: [facility]  URL: [email or web address]  Date: 03/16/2018  Prepared by: Faustino Congress   Exercises  Leg Swing Single Leg Balance - 5 reps - 1 sets - 10-15 sec hold - 1x daily - 7x weekly  Single Leg Heel Raise with Chair Support - 10 reps - 1 sets - 1x daily - 7x weekly

## 2018-03-18 ENCOUNTER — Encounter: Payer: BLUE CROSS/BLUE SHIELD | Admitting: Physical Therapy

## 2018-03-21 ENCOUNTER — Encounter: Payer: Self-pay | Admitting: Physician Assistant

## 2018-03-21 ENCOUNTER — Ambulatory Visit (INDEPENDENT_AMBULATORY_CARE_PROVIDER_SITE_OTHER): Payer: BLUE CROSS/BLUE SHIELD | Admitting: Physician Assistant

## 2018-03-21 VITALS — BP 131/87 | HR 85 | Temp 98.0°F | Ht 68.0 in | Wt 240.6 lb

## 2018-03-21 DIAGNOSIS — N3000 Acute cystitis without hematuria: Secondary | ICD-10-CM | POA: Diagnosis not present

## 2018-03-21 DIAGNOSIS — Z029 Encounter for administrative examinations, unspecified: Secondary | ICD-10-CM

## 2018-03-21 DIAGNOSIS — I10 Essential (primary) hypertension: Secondary | ICD-10-CM | POA: Diagnosis not present

## 2018-03-21 DIAGNOSIS — M1711 Unilateral primary osteoarthritis, right knee: Secondary | ICD-10-CM | POA: Diagnosis not present

## 2018-03-21 MED ORDER — IBUPROFEN 800 MG PO TABS: 800.0000 mg | ORAL_TABLET | Freq: Three times a day (TID) | ORAL | 5 refills | Status: DC | PRN

## 2018-03-21 MED ORDER — FLUCONAZOLE 150 MG PO TABS: ORAL_TABLET | ORAL | 0 refills | Status: DC

## 2018-03-21 MED ORDER — TRAMADOL HCL 50 MG PO TABS: 50.0000 mg | ORAL_TABLET | Freq: Three times a day (TID) | ORAL | 5 refills | Status: DC | PRN

## 2018-03-21 MED ORDER — LEVOFLOXACIN 500 MG PO TABS: 500.0000 mg | ORAL_TABLET | Freq: Every day | ORAL | 0 refills | Status: DC

## 2018-03-21 NOTE — Progress Notes (Signed)
BP 131/87   Pulse 85   Temp 98 F (36.7 C) (Oral)   Ht 5\' 8"  (1.727 m)   Wt 240 lb 9.6 oz (109.1 kg)   BMI 36.58 kg/m    Subjective:    Patient ID: Judith [name], female    DOB: 04-28-1971, 47 y.o.   MRN: [id number]  HPI: Judith [name] is a 47 y.o. female presenting on 03/21/2018 for surgical clearance  She is actually not here for surgical clearance at this time.  She will be having surgery February 2020 on her right knee to have it replaced.  But at this time she will be returning to work until the surgery.  Her work allows her to have some restrictions and this is a good time for her to get back during this interval.  She has been on long-term disability with Judith [name].  There will be a substantiation paper coming to report on her being out of work 831 through 4 for long-term disability.  We have previously written her through December 31, not knowing how long it would take for her to recover.  She does report physical therapy says she is doing very well.  She still gone 1 time a week for therapy.  Letter has been written with the following:  Judith [name] was seen in my clinic on 03/21/2018. She had a left total knee replacement earlier this year and has been on long-term disability related to this.  Her long-term disability was covering her from January 15, 2018 through today 03/21/2018.  She will be returning to work on 03/22/2018.  She does have restriction in walking, standing, sitting.  She needs to be able to do these as tolerated due to the arthritis that she has also in her right knee.  She may need to ice the replacement knee due to swelling after standing for some time.  Also her right knee will be replaced in February 2020.  This patient has had several days of dysuria, frequency and nocturia. There is also pain over the bladder in the suprapubic region, no back pain. Denies leakage or hematuria.  Denies fever or chills. No pain in flank area.  She also reports that she has stopped  taking her lisinopril.  She has been taking propranolol for anxiety.  While she was taking the 2 of those her blood pressure was dropping.  However with taking just the propranolol she has had fairly good readings so lisinopril will be discontinued.  Past Medical History:  Diagnosis Date  . Asthma   . Bipolar 1 disorder ([place])   . Depression   . GERD (gastroesophageal reflux disease)   . Hyperlipemia   . Hypothyroid   . Migraines    Relevant past medical, surgical, family and social history reviewed and updated as indicated. Interim medical history since our last visit reviewed. Allergies and medications reviewed and updated. DATA REVIEWED: CHART IN EPIC  Family History reviewed for pertinent findings.  Review of Systems  Constitutional: Negative.  Negative for fatigue and fever.  HENT: Negative.   Eyes: Negative.   Respiratory: Negative.   Cardiovascular: Negative.  Negative for chest pain, palpitations and leg swelling.  Gastrointestinal: Negative.   Genitourinary: Positive for difficulty urinating, dysuria and urgency. Negative for flank pain.  Musculoskeletal: Positive for arthralgias, gait problem, joint swelling and myalgias.    Allergies as of 03/21/2018      Reactions   Diphenhydramine Other (See Comments)   Hair feels like it is  crawling   Morphine Nausea And Vomiting   PCA PUMP N/V IV PUSH IN ER NO PROBLEM PER PT.   Prednisone Nausea And Vomiting   Erythromycin Hives   Sulfonamide Derivatives Other (See Comments)   Does not take due to family history      Medication List        Accurate as of 03/21/18 10:16 AM. Always use your most recent med list.          albuterol (2.5 MG/3ML) 0.083% nebulizer solution Commonly known as:  PROVENTIL Nebulize 1 vial three times daily   aspirin EC 81 MG tablet Take 162 mg by mouth daily.   atorvastatin 10 MG tablet Commonly known as:  LIPITOR Take 1 tablet (10 mg total) by mouth daily.   budesonide-formoterol  160-4.5 MCG/ACT inhaler Commonly known as:  SYMBICORT Inhale 2 puffs into the lungs 2 (two) times daily.   busPIRone 15 MG tablet Commonly known as:  BUSPAR Take 15 mg by mouth 3 (three) times daily.   cetirizine 10 MG tablet Commonly known as:  ZYRTEC Take 1 tablet (10 mg total) by mouth daily.   diclofenac sodium 1 % Gel Commonly known as:  VOLTAREN Apply 4 g topically 4 (four) times daily.   dicyclomine 20 MG tablet Commonly known as:  BENTYL Take 1 tablet (20 mg total) by mouth every 6 (six) hours.   fluconazole 150 MG tablet Commonly known as:  DIFLUCAN 1 po q week x 4 weeks   hydrOXYzine 25 MG tablet Commonly known as:  ATARAX/VISTARIL Take 1 tablet (25 mg total) by mouth daily.   ibuprofen 800 MG tablet Commonly known as:  ADVIL,MOTRIN Take 1 tablet (800 mg total) by mouth every 8 (eight) hours as needed.   lamoTRIgine 150 MG tablet Commonly known as:  LAMICTAL Take 150 mg by mouth 2 (two) times daily.   levofloxacin 500 MG tablet Commonly known as:  LEVAQUIN Take 1 tablet (500 mg total) by mouth daily.   levothyroxine 125 MCG tablet Commonly known as:  SYNTHROID, LEVOTHROID Take 1 tablet (125 mcg total) by mouth daily.   mometasone 0.1 % cream Commonly known as:  ELOCON Apply 1 application topically daily. x10 days   montelukast 10 MG tablet Commonly known as:  SINGULAIR Take 1 tablet (10 mg total) by mouth at bedtime.   ondansetron 8 MG disintegrating tablet Commonly known as:  ZOFRAN-ODT Take 1 tablet (8 mg total) by mouth every 8 (eight) hours as needed for nausea or vomiting.   pantoprazole 40 MG tablet Commonly known as:  PROTONIX Take 1 tablet (40 mg total) by mouth 2 (two) times daily.   pioglitazone 15 MG tablet Commonly known as:  ACTOS Take 1 tablet (15 mg total) by mouth daily.   propranolol 20 MG tablet Commonly known as:  INDERAL Take 20 mg by mouth 3 (three) times daily.   sertraline 100 MG tablet Commonly known as:   ZOLOFT Take 200 mg by mouth daily.   SUMAtriptan Succinate 4 MG/0.5ML Soct Inject 4 mg into the skin once.   tiZANidine 4 MG tablet Commonly known as:  ZANAFLEX Take 4 mg by mouth every 6 (six) hours as needed for muscle spasms.   traMADol 50 MG tablet Commonly known as:  ULTRAM Take 1 tablet (50 mg total) by mouth every 8 (eight) hours as needed for severe pain.   VYVANSE PO Take 70 mg by mouth daily.          Objective:  BP 131/87   Pulse 85   Temp 98 F (36.7 C) (Oral)   Ht 5\' 8"  (1.727 m)   Wt 240 lb 9.6 oz (109.1 kg)   BMI 36.58 kg/m   Allergies  Allergen Reactions  . Diphenhydramine Other (See Comments)    Hair feels like it is crawling  . Morphine Nausea And Vomiting    PCA PUMP N/V IV PUSH IN ER NO PROBLEM PER PT.  . Prednisone Nausea And Vomiting  . Erythromycin Hives  . Sulfonamide Derivatives Other (See Comments)    Does not take due to family history    Wt Readings from Last 3 Encounters:  03/21/18 240 lb 9.6 oz (109.1 kg)  02/22/18 236 lb (107 kg)  02/21/18 233 lb (105.7 kg)    Physical Exam  Constitutional: She is oriented to person, place, and time. She appears well-developed and well-nourished.  HENT:  Head: Normocephalic and atraumatic.  Eyes: Pupils are equal, round, and reactive to light. Conjunctivae are normal.  Cardiovascular: Normal rate, regular rhythm, normal heart sounds and intact distal pulses.  Pulmonary/Chest: Effort normal and breath sounds normal.  Abdominal: Soft. Bowel sounds are normal. She exhibits no distension and no mass. There is tenderness in the suprapubic area. There is no rebound, no guarding and no CVA tenderness.  Neurological: She is alert and oriented to person, place, and time. She has normal reflexes.  Skin: Skin is warm and dry. No rash noted.  Psychiatric: She has a normal mood and affect. Her behavior is normal. Judgment and thought content normal.    Results for orders placed or performed in visit  on 02/21/18  Lyme Ab/Western Blot Reflex  Result Value Ref Range   Lyme IgG/IgM Ab <0.91 0.00 - 0.90 ISR   LYME DISEASE AB, QUANT, IGM <0.80 0.00 - 0.79 index      Assessment & Plan:   1. Primary osteoarthritis of right knee - traMADol (ULTRAM) 50 MG tablet; Take 1 tablet (50 mg total) by mouth every 8 (eight) hours as needed for severe pain.  Dispense: 90 tablet; Refill: 5 - ibuprofen (ADVIL,MOTRIN) 800 MG tablet; Take 1 tablet (800 mg total) by mouth every 8 (eight) hours as needed.  Dispense: 90 tablet; Refill: 5  2. Acute cystitis without hematuria - levofloxacin (LEVAQUIN) 500 MG tablet; Take 1 tablet (500 mg total) by mouth daily.  Dispense: 10 tablet; Refill: 0 - fluconazole (DIFLUCAN) 150 MG tablet; 1 po q week x 4 weeks  Dispense: 4 tablet; Refill: 0  3. Essential hypertension Continue propranolol Discontinue lisinopril   Continue all other maintenance medications as listed above.  Follow up plan: No follow-ups on file.  Educational handout given for [place] PA-C [facility] [address]  [place], [place] [address] [phone]   03/21/2018, 10:16 AM

## 2018-03-24 ENCOUNTER — Encounter: Payer: Self-pay | Admitting: Physical Therapy

## 2018-03-24 ENCOUNTER — Ambulatory Visit (INDEPENDENT_AMBULATORY_CARE_PROVIDER_SITE_OTHER): Payer: BLUE CROSS/BLUE SHIELD | Admitting: Neurology

## 2018-03-24 DIAGNOSIS — R51 Headache: Secondary | ICD-10-CM

## 2018-03-24 DIAGNOSIS — G4720 Circadian rhythm sleep disorder, unspecified type: Secondary | ICD-10-CM

## 2018-03-24 DIAGNOSIS — R400 Somnolence: Secondary | ICD-10-CM

## 2018-03-24 DIAGNOSIS — R0683 Snoring: Secondary | ICD-10-CM

## 2018-03-24 DIAGNOSIS — R519 Headache, unspecified: Secondary | ICD-10-CM

## 2018-03-24 DIAGNOSIS — E669 Obesity, unspecified: Secondary | ICD-10-CM

## 2018-03-24 DIAGNOSIS — G4733 Obstructive sleep apnea (adult) (pediatric): Secondary | ICD-10-CM

## 2018-03-24 DIAGNOSIS — R351 Nocturia: Secondary | ICD-10-CM

## 2018-03-24 DIAGNOSIS — J4520 Mild intermittent asthma, uncomplicated: Secondary | ICD-10-CM

## 2018-03-24 DIAGNOSIS — G4734 Idiopathic sleep related nonobstructive alveolar hypoventilation: Secondary | ICD-10-CM

## 2018-03-24 DIAGNOSIS — Z9289 Personal history of other medical treatment: Secondary | ICD-10-CM

## 2018-03-30 NOTE — Procedures (Signed)
[name] NAME:  Judith [name], Judith [name] DOB:      02/27/1971      MR#:    [id number]     DATE OF RECORDING: 03/24/2018 REFERRING M.D.:  Particia Nearing, PA-C Study Performed:   Baseline Polysomnogram HISTORY: 47 year old woman with a history of migraine headaches, mood d/o, asthma, hyperlipidemia, hypothyroidism, status post multiple surgeries, and morbid obesity, who reports snoring and excessive daytime somnolence. The patient endorsed the Epworth Sleepiness Scale at 17 points. The patient's weight 236 pounds with a height of 68 (inches), resulting in a BMI of 35.8 kg/m2. The patient's neck circumference measured 16.8 inches.  CURRENT MEDICATIONS: Proventil, Aspirin, Lipitor, Symbicort, Buspar, Zyrtec, Voltaren, Bentyl, Vistaril, Lamictal, Levaquin, Synthroid, Vyvanse, Prinivil, Elocon, Singulair, Zofran, Protonix, Actos, Inderal, Zoloft, Zanaflex, Ultram, Sumatriptan succinate   PROCEDURE:  This is a multichannel digital polysomnogram utilizing the Somnostar 11.2 system.  Electrodes and sensors were applied and monitored per AASM Specifications.   EEG, EOG, Chin and Limb EMG, were sampled at 200 Hz.  ECG, Snore and Nasal Pressure, Thermal Airflow, Respiratory Effort, CPAP Flow and Pressure, Oximetry was sampled at 50 Hz. Digital video and audio were recorded.      BASELINE STUDY  Lights Out was at 05:00 and Lights On at 13:14.  Total recording time (TRT) was 495 minutes, with a total sleep time (TST) of 441 minutes.   The patient's sleep latency was 35.5 minutes, which is delayed. REM latency was 293.5 minutes, which is markedly delayed. The sleep efficiency was 89.1 %.     SLEEP ARCHITECTURE: WASO (Wake after sleep onset) was 25.5 minutes with mild sleep fragmentation noted. There were 15 minutes in Stage N1, 245 minutes Stage N2, 121 minutes Stage N3 and 60 minutes in Stage REM.  The percentage of Stage N1 was 3.4%, Stage N2 was 55.6%, Stage N3 was 27.4% and Stage R (REM sleep) was 13.6%, which is mildly reduced.  The arousals were noted as: 73 were spontaneous, 14 were associated with PLMs, 14 were associated with respiratory events.  RESPIRATORY ANALYSIS:  There were a total of 64 respiratory events:  13 obstructive apneas, 0 central apneas and 6 mixed apneas with a total of 19 apneas and an apnea index (AI) of 2.6 /hour. There were 45 hypopneas with a hypopnea index of 6.1 /hour. The patient also had 0 respiratory event related arousals (RERAs).      The total APNEA/HYPOPNEA INDEX (AHI) was 8.7 /hour and the total RESPIRATORY DISTURBANCE INDEX was 0. 8.7 /hour.  46 events occurred in REM sleep and 24 events in NREM. The REM AHI was 46 /hour, versus a non-REM AHI of 2.8. The patient spent 243.5 minutes of total sleep time in the supine position and 198 minutes in non-supine.. The supine AHI was 12.8 versus a non-supine AHI of 3.6.  OXYGEN SATURATION & C02:  The Wake baseline 02 saturation was 97%, with the lowest being 74%. Time spent below 89% saturation equaled 59 minutes. PERIODIC LIMB MOVEMENTS: The patient had a total of 84 Periodic Limb Movements.  The Periodic Limb Movement (PLM) index was 11.4 and the PLM Arousal index was 1.9/hour.  Audio and video analysis did not show any abnormal or unusual movements, behaviors, phonations or vocalizations. The patient took 1 bathroom break. Mild to moderate snoring was noted. The EKG was in keeping with normal sinus rhythm (NSR).  Post-study, the patient indicated that sleep was the same as usual.   IMPRESSION:  1. Obstructive Sleep Apnea (OSA) 2. Dysfunctions associated with  sleep stages or arousal from sleep  RECOMMENDATIONS:  1. This study demonstrates overall mild obstructive sleep apnea, severe in REM sleep with a total AHI of 6.1/hour, REM AHI of 46/hour, and O2 nadir of 74% (during supine REM sleep). Given the patient's medical history and sleep related complaints, treatment with positive airway pressure is recommended; this can be achieved in the  form of autoPAP. Alternatively, a full-night CPAP titration study would allow optimization of therapy if needed. Other treatment options may include avoidance of supine sleep position along with weight loss, upper airway or jaw surgery in selected patients or the use of an oral appliance in certain patients. ENT evaluation and/or consultation with a maxillofacial surgeon or dentist may be feasible in some instances.    2. Please note that untreated obstructive sleep apnea carries additional perioperative morbidity. Patients with significant obstructive sleep apnea should receive perioperative PAP therapy and the surgeons and particularly the anesthesiologist should be informed of the diagnosis and the severity of the sleep disordered breathing. 3. This study shows sleep fragmentation and abnormal sleep stage percentages; these are nonspecific findings and per se do not signify an intrinsic sleep disorder or a cause for the patient's sleep-related symptoms. Causes include (but are not limited to) the first night effect of the sleep study, circadian rhythm disturbances, medication effect or an underlying mood disorder or medical problem.  4. The patient should be cautioned not to drive, work at heights, or operate dangerous or heavy equipment when tired or sleepy. Review and reiteration of good sleep hygiene measures should be pursued with any patient. 5. The patient will be seen in follow-up by Dr. Rexene Alberts at [facility] for discussion of the test results and further management strategies. The referring provider will be notified of the test results.  I certify that I have reviewed the entire raw data recording prior to the issuance of this report in accordance with the Standards of Accreditation of the American Academy of Sleep Medicine (AASM)   Star Age, MD, PhD [facility]) Diplomat, ABPN (Neurology and Sleep)

## 2018-03-31 ENCOUNTER — Telehealth: Payer: Self-pay

## 2018-03-31 NOTE — Telephone Encounter (Signed)
Pt returned my call. When I picked up, she said that she had a minute to talk to but then asked me to "hold on" and the call was disconnected.

## 2018-03-31 NOTE — Progress Notes (Signed)
Patient referred by Particia Nearing, seen by me on 02/22/18, diagnostic PSG on 03/24/18.    Please call and notify the patient that the recent sleep study did show obstructive sleep apnea. OSA is overall mild, but worth treating to see if she feels better after treatment. To that end I recommend treatment for this in the form of autoPAP, which means, that we don't have to bring her back for a second sleep study with CPAP, but will let her try an autoPAP machine at home, through a DME company (of her choice, or as per insurance requirement). The DME representative will educate her on how to use the machine, how to put the mask on, etc. I have placed an order in the chart. Please send referral, talk to patient, send report to referring MD. We will need a FU in sleep clinic for 10 weeks post-PAP set up, please arrange that with me or one of our NPs. Thanks,   Star Age, MD, PhD [facility])

## 2018-03-31 NOTE — Telephone Encounter (Signed)
-----   Message from Star Age, MD sent at 03/31/2018  7:58 AM EST ----- Patient referred by Particia Nearing, seen by me on 02/22/18, diagnostic PSG on 03/24/18.    Please call and notify the patient that the recent sleep study did show obstructive sleep apnea. OSA is overall mild, but worth treating to see if she feels better after treatment. To that end I recommend treatment for this in the form of autoPAP, which means, that we don't have to bring her back for a second sleep study with CPAP, but will let her try an autoPAP machine at home, through a DME company (of her choice, or as per insurance requirement). The DME representative will educate her on how to use the machine, how to put the mask on, etc. I have placed an order in the chart. Please send referral, talk to patient, send report to referring MD. We will need a FU in sleep clinic for 10 weeks post-PAP set up, please arrange that with me or one of our NPs. Thanks,   Star Age, MD, PhD [facility])

## 2018-03-31 NOTE — Addendum Note (Signed)
Addended by: Star Age on: 03/31/2018 07:58 AM   Modules accepted: Orders

## 2018-03-31 NOTE — Telephone Encounter (Signed)
Pt called back. I advised pt that Dr. Rexene Alberts reviewed their sleep study results and found that pt has mild osa. Dr. Rexene Alberts recommends that pt start an auto pap at home. I reviewed PAP compliance expectations with the pt. Pt is agreeable to starting an auto-PAP. I advised pt that an order will be sent to a DME, [facility], and [facility] will call the pt within about one week after they file with the pt's insurance. [facility] will show the pt how to use the machine, fit for masks, and troubleshoot the auto-PAP if needed. A follow up appt was made for insurance purposes with Hoyle Sauer, NP on 227/20 at 3:45pm. Pt verbalized understanding to arrive 15 minutes early and bring their auto-PAP. A letter with all of this information in it will be mailed to the pt as a reminder. I verified with the pt that the address we have on file is correct. Pt verbalized understanding of results. Pt had no questions at this time but was encouraged to call back if questions arise. I have sent the order to [facility] and have received confirmation that they have received the order.

## 2018-03-31 NOTE — Telephone Encounter (Signed)
I called pt to discuss her sleep study results. No answer, left a message asking her to call me back. 

## 2018-04-04 ENCOUNTER — Telehealth: Payer: Self-pay | Admitting: Physician Assistant

## 2018-04-04 DIAGNOSIS — M7989 Other specified soft tissue disorders: Secondary | ICD-10-CM | POA: Diagnosis not present

## 2018-04-04 DIAGNOSIS — Z885 Allergy status to narcotic agent status: Secondary | ICD-10-CM | POA: Diagnosis not present

## 2018-04-04 DIAGNOSIS — M25562 Pain in left knee: Secondary | ICD-10-CM | POA: Diagnosis not present

## 2018-04-04 DIAGNOSIS — K219 Gastro-esophageal reflux disease without esophagitis: Secondary | ICD-10-CM | POA: Diagnosis not present

## 2018-04-04 DIAGNOSIS — J45909 Unspecified asthma, uncomplicated: Secondary | ICD-10-CM | POA: Diagnosis not present

## 2018-04-04 DIAGNOSIS — Z881 Allergy status to other antibiotic agents status: Secondary | ICD-10-CM | POA: Diagnosis not present

## 2018-04-04 DIAGNOSIS — Z7951 Long term (current) use of inhaled steroids: Secondary | ICD-10-CM | POA: Diagnosis not present

## 2018-04-04 DIAGNOSIS — M79605 Pain in left leg: Secondary | ICD-10-CM | POA: Diagnosis not present

## 2018-04-04 DIAGNOSIS — Z96652 Presence of left artificial knee joint: Secondary | ICD-10-CM | POA: Diagnosis not present

## 2018-04-04 DIAGNOSIS — E119 Type 2 diabetes mellitus without complications: Secondary | ICD-10-CM | POA: Diagnosis not present

## 2018-04-04 DIAGNOSIS — Z7982 Long term (current) use of aspirin: Secondary | ICD-10-CM | POA: Diagnosis not present

## 2018-04-04 DIAGNOSIS — R2242 Localized swelling, mass and lump, left lower limb: Secondary | ICD-10-CM | POA: Diagnosis not present

## 2018-04-04 DIAGNOSIS — Z87891 Personal history of nicotine dependence: Secondary | ICD-10-CM | POA: Diagnosis not present

## 2018-04-04 DIAGNOSIS — Z79899 Other long term (current) drug therapy: Secondary | ICD-10-CM | POA: Diagnosis not present

## 2018-04-04 DIAGNOSIS — Z883 Allergy status to other anti-infective agents status: Secondary | ICD-10-CM | POA: Diagnosis not present

## 2018-04-04 DIAGNOSIS — F319 Bipolar disorder, unspecified: Secondary | ICD-10-CM | POA: Diagnosis not present

## 2018-04-04 DIAGNOSIS — Z888 Allergy status to other drugs, medicaments and biological substances status: Secondary | ICD-10-CM | POA: Diagnosis not present

## 2018-04-04 NOTE — Telephone Encounter (Signed)
Patient given an appointment for 11/19 with [facility].

## 2018-04-04 NOTE — Telephone Encounter (Signed)
PT needs to see Judith [name] ASAP said she tried to go back to work and its not working, offered 12/10 apt pt denied said she can't wait that long to send message to nurse to see if she can get her in sooner.

## 2018-04-05 ENCOUNTER — Ambulatory Visit (INDEPENDENT_AMBULATORY_CARE_PROVIDER_SITE_OTHER): Payer: BLUE CROSS/BLUE SHIELD | Admitting: Physician Assistant

## 2018-04-05 ENCOUNTER — Encounter: Payer: Self-pay | Admitting: Physician Assistant

## 2018-04-05 VITALS — BP 153/88 | HR 82 | Temp 97.6°F | Ht 68.0 in | Wt 246.4 lb

## 2018-04-05 DIAGNOSIS — Z96652 Presence of left artificial knee joint: Secondary | ICD-10-CM

## 2018-04-05 DIAGNOSIS — M1711 Unilateral primary osteoarthritis, right knee: Secondary | ICD-10-CM | POA: Diagnosis not present

## 2018-04-05 NOTE — Patient Instructions (Signed)
In a few days you may receive a survey in the mail or online from [employer] regarding your visit with us today. Please take a moment to fill this out. Your feedback is very important to our whole office. It can help us better understand your needs as well as improve your experience and satisfaction. Thank you for taking your time to complete it. We care about you.  Geoffery Aultman, PA-C  

## 2018-04-05 NOTE — Progress Notes (Signed)
BP (!) 153/88   Pulse 82   Temp 97.6 F (36.4 C) (Oral)   Ht 5\' 8"  (1.727 m)   Wt 246 lb 6.4 oz (111.8 kg)   BMI 37.46 kg/m    Subjective:    Patient ID: Judith [name], female    DOB: Oct 12, 1970, 47 y.o.   MRN: [id number]  HPI: Judith [name] is a 47 y.o. female presenting on 04/05/2018 for Hospitalization Follow-up (knee pain)  Patient comes in today for hospital follow-up due to her having significant knee pain.  She gone back to work a little over 2 weeks.  She has significant swelling in the left knee which is the replaced knee.  It would not go down she also had a great amount of pain in her right knee which is somewhat still with degenerative joint disease.  She anticipates having knee replacement on February 5.  We will be taking her out of work through this time and 6 to 8 weeks after the surgery.  Separate new issue, had gone to work and lasted a littleover 2 weeks and then knees were hurting too badly. She went to the ED for check and then here to [place] today.  So, new short term disability claim will be started. Out 04/01/18-08/03/2018. Surgery 06/22/17 And recheck with [place] 07/11/18 [employer] is insurance company  Past Medical History:  Diagnosis Date  . Asthma   . Bipolar 1 disorder ([place])   . Depression   . GERD (gastroesophageal reflux disease)   . Hyperlipemia   . Hypothyroid   . Migraines    Relevant past medical, surgical, family and social history reviewed and updated as indicated. Interim medical history since our last visit reviewed. Allergies and medications reviewed and updated. DATA REVIEWED: CHART IN EPIC  Family History reviewed for pertinent findings.  Review of Systems  Constitutional: Negative.   HENT: Negative.   Eyes: Negative.   Respiratory: Negative.   Gastrointestinal: Negative.   Genitourinary: Negative.   Musculoskeletal: Positive for arthralgias, gait problem, joint swelling and myalgias.    Allergies as of 04/05/2018    Reactions   Diphenhydramine Other (See Comments)   Hair feels like it is crawling   Morphine Nausea And Vomiting   PCA PUMP N/V IV PUSH IN ER NO PROBLEM PER PT.   Prednisone Nausea And Vomiting   Erythromycin Hives   Sulfonamide Derivatives Other (See Comments)   Does not take due to family history      Medication List        Accurate as of 04/05/18  1:39 PM. Always use your most recent med list.          albuterol (2.5 MG/3ML) 0.083% nebulizer solution Commonly known as:  PROVENTIL Nebulize 1 vial three times daily   aspirin EC 81 MG tablet Take 162 mg by mouth daily.   atorvastatin 10 MG tablet Commonly known as:  LIPITOR Take 1 tablet (10 mg total) by mouth daily.   budesonide-formoterol 160-4.5 MCG/ACT inhaler Commonly known as:  SYMBICORT Inhale 2 puffs into the lungs 2 (two) times daily.   busPIRone 15 MG tablet Commonly known as:  BUSPAR Take 15 mg by mouth 3 (three) times daily.   cetirizine 10 MG tablet Commonly known as:  ZYRTEC Take 1 tablet (10 mg total) by mouth daily.   diclofenac sodium 1 % Gel Commonly known as:  VOLTAREN Apply 4 g topically 4 (four) times daily.   dicyclomine 20 MG tablet  Commonly known as:  BENTYL Take 1 tablet (20 mg total) by mouth every 6 (six) hours.   fluconazole 150 MG tablet Commonly known as:  DIFLUCAN 1 po q week x 4 weeks   hydrOXYzine 25 MG tablet Commonly known as:  ATARAX/VISTARIL Take 1 tablet (25 mg total) by mouth daily.   ibuprofen 800 MG tablet Commonly known as:  ADVIL,MOTRIN Take 1 tablet (800 mg total) by mouth every 8 (eight) hours as needed.   lamoTRIgine 150 MG tablet Commonly known as:  LAMICTAL Take 150 mg by mouth 2 (two) times daily.   levofloxacin 500 MG tablet Commonly known as:  LEVAQUIN Take 1 tablet (500 mg total) by mouth daily.   levothyroxine 125 MCG tablet Commonly known as:  SYNTHROID, LEVOTHROID Take 1 tablet (125 mcg total) by mouth daily.   mometasone 0.1 %  cream Commonly known as:  ELOCON Apply 1 application topically daily. x10 days   montelukast 10 MG tablet Commonly known as:  SINGULAIR Take 1 tablet (10 mg total) by mouth at bedtime.   ondansetron 8 MG disintegrating tablet Commonly known as:  ZOFRAN-ODT Take 1 tablet (8 mg total) by mouth every 8 (eight) hours as needed for nausea or vomiting.   pantoprazole 40 MG tablet Commonly known as:  PROTONIX Take 1 tablet (40 mg total) by mouth 2 (two) times daily.   pioglitazone 15 MG tablet Commonly known as:  ACTOS Take 1 tablet (15 mg total) by mouth daily.   propranolol 20 MG tablet Commonly known as:  INDERAL Take 20 mg by mouth 3 (three) times daily.   sertraline 100 MG tablet Commonly known as:  ZOLOFT Take 200 mg by mouth daily.   SUMAtriptan Succinate 4 MG/0.5ML Soct Inject 4 mg into the skin once.   tiZANidine 4 MG tablet Commonly known as:  ZANAFLEX Take 4 mg by mouth every 6 (six) hours as needed for muscle spasms.   traMADol 50 MG tablet Commonly known as:  ULTRAM Take 1 tablet (50 mg total) by mouth every 8 (eight) hours as needed for severe pain.   VYVANSE PO Take 70 mg by mouth daily.          Objective:    BP (!) 153/88   Pulse 82   Temp 97.6 F (36.4 C) (Oral)   Ht 5\' 8"  (1.727 m)   Wt 246 lb 6.4 oz (111.8 kg)   BMI 37.46 kg/m   Allergies  Allergen Reactions  . Diphenhydramine Other (See Comments)    Hair feels like it is crawling  . Morphine Nausea And Vomiting    PCA PUMP N/V IV PUSH IN ER NO PROBLEM PER PT.  . Prednisone Nausea And Vomiting  . Erythromycin Hives  . Sulfonamide Derivatives Other (See Comments)    Does not take due to family history    Wt Readings from Last 3 Encounters:  04/05/18 246 lb 6.4 oz (111.8 kg)  03/21/18 240 lb 9.6 oz (109.1 kg)  02/22/18 236 lb (107 kg)    Physical Exam  Constitutional: She is oriented to person, place, and time. She appears well-developed and well-nourished.  HENT:  Head:  Normocephalic and atraumatic.  Eyes: Pupils are equal, round, and reactive to light. Conjunctivae and EOM are normal.  Cardiovascular: Normal rate, regular rhythm, normal heart sounds and intact distal pulses.  Pulmonary/Chest: Effort normal and breath sounds normal.  Abdominal: Soft. Bowel sounds are normal.  Musculoskeletal:       Right knee: She exhibits decreased  range of motion and swelling. Tenderness found.       Left knee: She exhibits decreased range of motion and swelling. Tenderness found.       Legs: Neurological: She is alert and oriented to person, place, and time. She has normal reflexes.  Skin: Skin is warm and dry. No rash noted.  Psychiatric: She has a normal mood and affect. Her behavior is normal. Judgment and thought content normal.        Assessment & Plan:   1. Primary osteoarthritis of right knee Continue medications Out of work  2. History of left knee replacement Continue medications Out of work   Continue all other maintenance medications as listed above.  Follow up plan: No follow-ups on file.  Educational handout given for [place] PA-C [place] [address]  [place], [place] [address] [phone]   04/05/2018, 1:39 PM

## 2018-04-07 DIAGNOSIS — G4733 Obstructive sleep apnea (adult) (pediatric): Secondary | ICD-10-CM | POA: Diagnosis not present

## 2018-04-11 DIAGNOSIS — Z029 Encounter for administrative examinations, unspecified: Secondary | ICD-10-CM

## 2018-04-21 ENCOUNTER — Other Ambulatory Visit: Payer: Self-pay | Admitting: Physician Assistant

## 2018-04-27 ENCOUNTER — Inpatient Hospital Stay
Admit: 2018-04-27 | Payer: PRIVATE HEALTH INSURANCE | Attending: Rehabilitative and Restorative Service Providers" | Primary: Family Medicine

## 2018-04-27 DIAGNOSIS — M25512 Pain in left shoulder: Secondary | ICD-10-CM

## 2018-04-27 NOTE — Progress Notes (Signed)
 PT INITIAL EVALUATION NOTE 2-15    Patient Name: Sara Cooke  Date:04/27/2018  DOB: December 22, 1970  [x]   Patient DOB Verified  Payor: HULAN / Plan: [employer] [place] EMPLOYEE PLAN / Product Type: PPO /    In time:151  Out time:255  Total Treatment Time (min): 64  Visit #: 1     Treatment Area: Pain in left shoulder [M25.512]    SUBJECTIVE  Pain Level (0-10 scale): 3  Any medication changes, allergies to medications, adverse drug reactions, diagnosis change, or new procedure performed?: []  No    [x]  Yes (see summary sheet for update)  Subjective:     Pt reports that she tripped over the sprinkler head in the yard and describes a FOOSH.  She reports that she laid on the ground for 15 minutes.  She was driven to the ER by neighbor.  X-ray showed fracture dislocation of the left proximal humerus.  She had a closed reduction in the ER and placed in a sling for 6 weeks.  She was taken out of the sling yesterday and is on a 10lb weight restriction.  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  She denies numbness or tingling at this time.  Previously she liked to walk and do crafting.  But she was working ~60 hours a week.        OBJECTIVE/EXAMINATION  OBJECTIVE  Posture:  Rounded shoulders and forward shoulder, left side depressed  Other Observations:  Minor bruising remains and there is swelling throughout the upper arm  Functional Grip and Pinch:  NT  Palpation: tender along the proximal humerus and shoulder     Left Shoulder ROM:  AROM   PROM   Flexion   76      Extension  NT      Abduction  60      Adduction  MT      IR   Hand to NT      ER   Hand to NT      Joint Mobility Assessment: Glenohumeral: decreased      Acromioclavicular: NT      Sternoclavicular: NT  Shoulder Strength NT        Neurological: Reflexes / Sensations: grossly intact  Special Tests: NT        Modality rationale: decrease inflammation and decrease pain to improve the patient's ability to complete all activity   Min Type Additional Details                                  15 [x]   Vasopneumatic Device Pressure:       []  lo [x]  med []  hi   Temperature: 34   [x]  Skin assessment post-treatment:  [x] intact [x] redness- no adverse reaction    [] redness - adverse reaction:       15 min Therapeutic Activity:  [x]   See flow sheet :   Rationale: increase ROM, increase strength and improve coordination  to improve the patient's ability to complete all activity     20 min Manual Therapy:  PROM with grade I and II inf and post GH glides, light distraction for comfort, STM to UT and levator   Rationale: decrease pain, increase ROM, increase tissue extensibility, decrease trigger points and increase postural awareness  to improve the patient's ability to complete all activity      Other Objective/Functional Measures:FOTO 38    Pain Level (  0-10 scale) post treatment: 4      ASSESSMENT:      [x]   See Plan of Care      Alm [name], PT 04/27/2018

## 2018-04-27 NOTE — Progress Notes (Signed)
 [facility]  [address]  [place], [place]  [address]  Phone: [phone] Fax: [phone]    Plan of Care/Statement of Necessity for Physical Therapy Services  2-15    Patient name: Sara Cooke  DOB: 17-Dec-1970  Provider#: [id number]  Referral source: Claudene, Julious(Jody) P,*      Medical/Treatment Diagnosis: Pain in left shoulder [M25.512]     Prior Hospitalization: see medical history     Comorbidities: allergies, anxiety/panic disorder, asthma, back pain, BMI?30, GI disease, headaches, previous accident  Prior Level of Function: 20 minutes of exercise 1-2 times a week  Medications: Verified on Patient Summary List    Start of Care: 12/11/9      Onset Date: 03/14/18       The Plan of Care and following information is based on the information from the initial evaluation.  Assessment/ key information: Pt is a 47 yo female who is in today to begin therapy following a proximal humeral fracture.  She reports that she tripped on a sprinkler head and suffered a FOOSH.  This resulted in a fracture dislocation of the proximal humerus.  She was able to avoid surgery and placed in a sling for 6 weeks.  She had the sling removed yesterday and has been sent to begin her therapy.  She presents with AROM flex 76 degrees and Abd 60.  It was difficult to assess ROM beyond this secondary to guarding and discomfort.  She was educated on frozen shoulder and the possibility of that following this type of injury.  She acknowledged her understanding.  She is a good candidate for PT and will need to have skilled services to address her ROM and strength deficits to work back to her prior level of function      Evaluation Complexity History HIGH Complexity :3+ comorbidities / personal factors will impact the outcome/ POC ; Examination HIGH Complexity : 4+ Standardized tests and measures addressing body structure, function, activity limitation and / or participation in recreation   ;Presentation MEDIUM Complexity : Evolving with changing characteristics  ;Clinical Decision Making MEDIUM Complexity : FOTO score of 26-74  Overall Complexity Rating: MEDIUM    Problem List: pain affecting function, decrease ROM, decrease strength, decrease ADL/ functional abilitiies, decrease activity tolerance, decrease flexibility/ joint mobility and decrease transfer abilities   Treatment Plan may include any combination of the following: Therapeutic exercise, Therapeutic activities, Neuromuscular re-education, Physical agent/modality, Manual therapy, Patient education, Self Care training, Functional mobility training and Home safety training  Patient / Family readiness to learn indicated by: asking questions, trying to perform skills and interest  Persons(s) to be included in education: patient (P)  Barriers to Learning/Limitations: None  Patient Goal (s): "improve ROM of left shoulder"  Patient Self Reported Health Status: good  Rehabilitation Potential: good    Short Term Goals: To be accomplished in 6-8 treatments:   Pt will be I with HEP  Pt will complain of pain 2-3 with all activity  Pt will increase ROM to improve her ability to complete all basic ADL's  Pt will be able to tolerate full treatment session to include manual     Long Term Goals: To be accomplished in 16-18 treatments:   Pt will complain of pain 0-1 with all activity  Pt will have full AROM to complete all ADL's and household chores without increased symptoms  Pt will increase FOTO score by 5 points to meet MDC and improve their function  Pt will  be able to complete all work tasks without increased symptoms    Frequency / Duration: Patient to be seen 2 times per week for 16-18 treatments.    Patient/ Caregiver education and instruction: self care, activity modification and exercises    [x]   Plan of care has been reviewed with PTA    Alm [name], PT 05/04/2018      ________________________________________________________________________    I certify that the above Therapy Services are being furnished while the patient is under my care. I agree with the treatment plan and certify that this therapy is necessary.    Physician's Signature:____________________  Date:____________Time: _________

## 2018-04-27 NOTE — Progress Notes (Signed)
[facility]  [address]  [place], [place] [id number]  Phone: [phone] Fax: [phone]    Plan of Care/Statement of Necessity for Physical Therapy Services  2-15    Patient name: Sara Cooke  DOB: [birth year]  Provider#: [id number]  Referral source: Katrinka BlazingSmith, Julious(Jody) P,*      Medical/Treatment Diagnosis: Pain in left shoulder [M25.512]     Prior Hospitalization: see medical history     Comorbidities: allergies, anxiety/panic disorder, asthma, back pain, BMI?30, GI disease, headaches, previous accident  Prior Level of Function: 20 minutes of exercise 1-2 times a week  Medications: Verified on Patient Summary List    Start of Care: 12/11/9      Onset Date: 03/14/18       The Plan of Care and following information is based on the information from the initial evaluation.  Assessment/ key information: Pt is a 47 yo female who is in today to begin therapy following a proximal humeral fracture.  She reports that she tripped on a sprinkler head and suffered a FOOSH.  This resulted in a fracture dislocation of the proximal humerus.  She was able to avoid surgery and placed in a sling for 6 weeks.  She had the sling removed yesterday and has been sent to begin her therapy.  She presents with AROM flex 76 degrees and Abd 60.  It was difficult to assess ROM beyond this secondary to guarding and discomfort.  She was educated on frozen shoulder and the possibility of that following this type of injury.  She acknowledged her understanding.  She is a good candidate for PT and will need to have skilled services to address her ROM and strength deficits to work back to her prior level of function      Evaluation Complexity History HIGH Complexity :3+ comorbidities / personal factors will impact the outcome/ POC ; Examination HIGH Complexity : 4+ Standardized tests and measures addressing body structure, function,  activity limitation and / or participation in recreation  ;Presentation MEDIUM Complexity : Evolving with changing characteristics  ;Clinical Decision Making MEDIUM Complexity : FOTO score of 26-74  Overall Complexity Rating: MEDIUM    Problem List: pain affecting function, decrease ROM, decrease strength, decrease ADL/ functional abilitiies, decrease activity tolerance, decrease flexibility/ joint mobility and decrease transfer abilities   Treatment Plan may include any combination of the following: Therapeutic exercise, Therapeutic activities, Neuromuscular re-education, Physical agent/modality, Manual therapy, Patient education, Self Care training, Functional mobility training and Home safety training  Patient / Family readiness to learn indicated by: asking questions, trying to perform skills and interest  Persons(s) to be included in education: patient (P)  Barriers to Learning/Limitations: None  Patient Goal (s): ???improve ROM of left shoulder???  Patient Self Reported Health Status: good  Rehabilitation Potential: good    Short Term Goals: To be accomplished in 6-8 treatments:   Pt will be I with HEP  Pt will complain of pain 2-3 with all activity  Pt will increase ROM to improve her ability to complete all basic ADL's  Pt will be able to tolerate full treatment session to include manual     Long Term Goals: To be accomplished in 16-18 treatments:   Pt will complain of pain 0-1 with all activity  Pt will have full AROM to complete all ADL's and household chores without increased symptoms  Pt will increase FOTO score by 5 points to meet MDC and improve their function  Pt will  be able to complete all work tasks without increased symptoms    Frequency / Duration: Patient to be seen 2 times per week for 16-18 treatments.    Patient/ Caregiver education and instruction: self care, activity modification and exercises    [x]   Plan of care has been reviewed with PTA    Glenis Smokeravid M Ryland Tungate, PT 05/04/2018      ________________________________________________________________________    I certify that the above Therapy Services are being furnished while the patient is under my care. I agree with the treatment plan and certify that this therapy is necessary.    Physician's Signature:____________________  Date:____________Time: _________

## 2018-04-27 NOTE — Progress Notes (Signed)
PT INITIAL EVALUATION NOTE 2-15    Patient Name: Sara Cooke  Date:04/27/2018  DOB: 10/12/1970  [x]  Patient DOB Verified  Payor: A[employer]NA / Plan: [employer]  EMPLOYEE PLAN / Product Type: PPO /    In time:151  Out time:255  Total Treatment Time (min): 64  Visit #: 1     Treatment Area: Pain in left shoulder [M25.512]    SUBJECTIVE  Pain Level (0-10 scale): 3  Any medication changes, allergies to medications, adverse drug reactions, diagnosis change, or new procedure performed?: [] No    [x] Yes (see summary sheet for update)  Subjective:     Pt reports that she tripped over the sprinkler head in the yard and describes a FOOSH.  She reports that she laid on the ground for 15 minutes.  She was driven to the ER by neighbor.  X-ray showed fracture dislocation of the left proximal humerus.  She had a closed reduction in the ER and placed in a sling for 6 weeks.  She was taken out of the sling yesterday and is on a 10lb weight restriction.  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  She denies numbness or tingling at this time.  Previously she liked to walk and do crafting.  But she was working ~60 hours a week.        OBJECTIVE/EXAMINATION  OBJECTIVE  Posture:  Rounded shoulders and forward shoulder, left side depressed  Other Observations:  Minor bruising remains and there is swelling throughout the upper arm  Functional Grip and Pinch:  NT  Palpation: tender along the proximal humerus and shoulder     Left Shoulder ROM:  AROM   PROM   Flexion   76      Extension  NT      Abduction  60      Adduction  MT      IR   Hand to NT      ER   Hand to NT      Joint Mobility Assessment: Glenohumeral: decreased      Acromioclavicular: NT      Sternoclavicular: NT  Shoulder Strength NT        Neurological: Reflexes / Sensations: grossly intact  Special Tests: NT        Modality rationale: decrease inflammation and decrease pain to improve the patient???s ability to complete all activity   Min Type Additional Details                                  15 [x]  Vasopneumatic Device Pressure:       [] lo [x] med [] hi   Temperature: 34   [x] Skin assessment post-treatment:  [x]intact [x]redness- no adverse reaction    []redness ??? adverse reaction:       15 min Therapeutic Activity:  [x]  See flow sheet :   Rationale: increase ROM, increase strength and improve coordination  to improve the patient???s ability to complete all activity     20 min Manual Therapy:  PROM with grade I and II inf and post GH glides, light distraction for comfort, STM to UT and levator   Rationale: decrease pain, increase ROM, increase tissue extensibility, decrease trigger points and increase postural awareness  to improve the patient???s ability to complete all activity      Other Objective/Functional Measures:FOTO 38    Pain Level (  0-10 scale) post treatment: 4      ASSESSMENT:      [x]  See Plan of Care      Relda Agosto M Trany Chernick, PT 04/27/2018

## 2018-04-28 ENCOUNTER — Telehealth: Payer: Self-pay | Admitting: Physician Assistant

## 2018-04-29 ENCOUNTER — Other Ambulatory Visit: Payer: Self-pay | Admitting: Physician Assistant

## 2018-04-29 MED ORDER — AMOXICILLIN 500 MG PO CAPS: 500.0000 mg | ORAL_CAPSULE | Freq: Three times a day (TID) | ORAL | 0 refills | Status: DC

## 2018-04-29 NOTE — Telephone Encounter (Signed)
sent 

## 2018-04-29 NOTE — Telephone Encounter (Signed)
Which pharmacy?

## 2018-04-29 NOTE — Telephone Encounter (Signed)
Pt aware,

## 2018-05-02 ENCOUNTER — Other Ambulatory Visit: Payer: Self-pay | Admitting: Physician Assistant

## 2018-05-02 ENCOUNTER — Ambulatory Visit (INDEPENDENT_AMBULATORY_CARE_PROVIDER_SITE_OTHER): Payer: BLUE CROSS/BLUE SHIELD | Admitting: Family Medicine

## 2018-05-02 VITALS — BP 142/94 | HR 87 | Temp 99.0°F | Ht 68.0 in | Wt 247.0 lb

## 2018-05-02 DIAGNOSIS — N611 Abscess of the breast and nipple: Secondary | ICD-10-CM | POA: Diagnosis not present

## 2018-05-02 MED ORDER — ONDANSETRON 8 MG PO TBDP: 8.0000 mg | ORAL_TABLET | Freq: Three times a day (TID) | ORAL | 0 refills | Status: DC | PRN

## 2018-05-02 MED ORDER — PANTOPRAZOLE SODIUM 40 MG PO TBEC: 40.0000 mg | DELAYED_RELEASE_TABLET | Freq: Two times a day (BID) | ORAL | 5 refills | Status: DC

## 2018-05-02 MED ORDER — DOXYCYCLINE HYCLATE 100 MG PO TABS: 100.0000 mg | ORAL_TABLET | Freq: Two times a day (BID) | ORAL | 0 refills | Status: DC

## 2018-05-02 NOTE — Progress Notes (Signed)
Subjective: CC: Rash PCP: Terald Sleeper, PA-C HPI:Judith [name] is a 47 y.o. female presenting to clinic today for:  1. Rash Patient was seen by dermatology on 03/03/2018 for urticaria.  She was treated as folliculitis and intertrigo with increased dose of Zyrtec 10 mg p.o. twice daily, Atarax 25 mg 3 times daily, clindamycin solution nightly.  She is instructed to follow-up if symptoms did not improve.  She presents today and notes that she continues to have a rash and she has not scheduled to follow-up with dermatology yet.  She has not been using the clindamycin solution recently and has noticed an abscess develop under the right breast.  She notes that this started on Monday of last week but started getting better.  On Saturday, she noticed that it had opened up and started draining.  She reports some tenderness.  It is mostly bloody discharge coming from the breast now.  She denies any abnormal nipple discharge.  Her mammogram is scheduled for January.  She has had similar lesions elsewhere on the body.   ROS: Per HPI  Allergies  Allergen Reactions  . Diphenhydramine Other (See Comments)    Hair feels like it is crawling  . Morphine Nausea And Vomiting    PCA PUMP N/V IV PUSH IN ER NO PROBLEM PER PT.  . Prednisone Nausea And Vomiting  . Erythromycin Hives  . Sulfonamide Derivatives Other (See Comments)    Does not take due to family history   Past Medical History:  Diagnosis Date  . Asthma   . Bipolar 1 disorder ([place])   . Depression   . GERD (gastroesophageal reflux disease)   . Hyperlipemia   . Hypothyroid   . Migraines     Current Outpatient Medications:  .  albuterol (PROVENTIL) (2.5 MG/3ML) 0.083% nebulizer solution, Nebulize 1 vial three times daily, Disp: 150 mL, Rfl: 2 .  amoxicillin (AMOXIL) 500 MG capsule, Take 1 capsule (500 mg total) by mouth 3 (three) times daily., Disp: 30 capsule, Rfl: 0 .  aspirin EC 81 MG tablet, Take 162 mg by mouth daily. , Disp: ,  Rfl:  .  atorvastatin (LIPITOR) 10 MG tablet, Take 1 tablet (10 mg total) by mouth daily., Disp: 90 tablet, Rfl: 3 .  budesonide-formoterol (SYMBICORT) 160-4.5 MCG/ACT inhaler, Inhale 2 puffs into the lungs 2 (two) times daily., Disp: 1 Inhaler, Rfl: 3 .  busPIRone (BUSPAR) 15 MG tablet, Take 15 mg by mouth 3 (three) times daily. , Disp: , Rfl:  .  cetirizine (ZYRTEC) 10 MG tablet, Take 1 tablet (10 mg total) by mouth daily., Disp: 30 tablet, Rfl: 11 .  diclofenac sodium (VOLTAREN) 1 % GEL, Apply 4 g topically 4 (four) times daily., Disp: 400 g, Rfl: 2 .  dicyclomine (BENTYL) 20 MG tablet, Take 1 tablet (20 mg total) by mouth every 6 (six) hours., Disp: 60 tablet, Rfl: 5 .  fluconazole (DIFLUCAN) 150 MG tablet, 1 po q week x 4 weeks, Disp: 4 tablet, Rfl: 0 .  hydrOXYzine (ATARAX/VISTARIL) 25 MG tablet, Take 1 tablet (25 mg total) by mouth daily., Disp: 30 tablet, Rfl: 1 .  ibuprofen (ADVIL,MOTRIN) 800 MG tablet, Take 1 tablet (800 mg total) by mouth every 8 (eight) hours as needed., Disp: 90 tablet, Rfl: 5 .  lamoTRIgine (LAMICTAL) 150 MG tablet, Take 150 mg by mouth 2 (two) times daily., Disp: , Rfl:  .  levofloxacin (LEVAQUIN) 500 MG tablet, Take 1 tablet (500 mg total) by mouth daily., Disp:  10 tablet, Rfl: 0 .  levothyroxine (SYNTHROID, LEVOTHROID) 125 MCG tablet, Take 1 tablet (125 mcg total) by mouth daily., Disp: 90 tablet, Rfl: 1 .  Lisdexamfetamine Dimesylate (VYVANSE PO), Take 70 mg by mouth daily. , Disp: , Rfl:  .  mometasone (ELOCON) 0.1 % cream, Apply 1 application topically daily. x10 days, Disp: 45 g, Rfl: 0 .  montelukast (SINGULAIR) 10 MG tablet, Take 1 tablet (10 mg total) by mouth at bedtime., Disp: 90 tablet, Rfl: 0 .  ondansetron (ZOFRAN ODT) 8 MG disintegrating tablet, Take 1 tablet (8 mg total) by mouth every 8 (eight) hours as needed for nausea or vomiting., Disp: 20 tablet, Rfl: 0 .  pantoprazole (PROTONIX) 40 MG tablet, Take 1 tablet (40 mg total) by mouth 2 (two) times  daily., Disp: 60 tablet, Rfl: 5 .  pioglitazone (ACTOS) 15 MG tablet, Take 1 tablet (15 mg total) by mouth daily., Disp: 30 tablet, Rfl: 5 .  propranolol (INDERAL) 20 MG tablet, Take 20 mg by mouth 3 (three) times daily. , Disp: , Rfl:  .  sertraline (ZOLOFT) 100 MG tablet, Take 200 mg by mouth daily. , Disp: , Rfl:  .  SUMAtriptan Succinate 4 MG/0.5ML SOCT, Inject 4 mg into the skin once., Disp: 9 Cartridge, Rfl: 11 .  tiZANidine (ZANAFLEX) 4 MG tablet, Take 4 mg by mouth every 6 (six) hours as needed for muscle spasms., Disp: , Rfl:  .  tiZANidine (ZANAFLEX) 4 MG tablet, TAKE  (1)  TABLET  THREE TIMES DAILY., Disp: 60 tablet, Rfl: 0 .  traMADol (ULTRAM) 50 MG tablet, Take 1 tablet (50 mg total) by mouth every 8 (eight) hours as needed for severe pain., Disp: 90 tablet, Rfl: 5 Social History   Socioeconomic History  . Marital status: Divorced    Spouse name: Not on file  . Number of children: Not on file  . Years of education: Not on file  . Highest education level: Not on file  Occupational History  . Not on file  Social Needs  . Financial resource strain: Not on file  . Food insecurity:    Worry: Not on file    Inability: Not on file  . Transportation needs:    Medical: Not on file    Non-medical: Not on file  Tobacco Use  . Smoking status: Former Research scientist (life sciences)  . Smokeless tobacco: Never Used  Substance and Sexual Activity  . Alcohol use: No  . Drug use: No  . Sexual activity: Yes    Birth control/protection: I.U.D.  Lifestyle  . Physical activity:    Days per week: Not on file    Minutes per session: Not on file  . Stress: Not on file  Relationships  . Social connections:    Talks on phone: Not on file    Gets together: Not on file    Attends religious service: Not on file    Active member of club or organization: Not on file    Attends meetings of clubs or organizations: Not on file    Relationship status: Not on file  . Intimate partner violence:    Fear of current or  ex partner: Not on file    Emotionally abused: Not on file    Physically abused: Not on file    Forced sexual activity: Not on file  Other Topics Concern  . Not on file  Social History Narrative  . Not on file   Family History  Problem Relation Age of Onset  .  Mental illness Unknown   . Diabetes Unknown     Objective: Office vital signs reviewed. BP (!) 142/94   Pulse 87   Temp 99 F (37.2 C) (Oral)   Ht 5\' 8"  (1.727 m)   Wt 247 lb (112 kg)   BMI 37.56 kg/m   Physical Examination:  General: Awake, alert, obese, No acute distress Breast: Pendulous.  No nipple inversion or discharge.  She has a 3-1/2 mm punctum/area of skin breakdown underneath the right breast.  No visible drainage noted.  There is associated erythema and increased warmth.  Assessment/ Plan: 47 y.o. female   1. Breast abscess of female Appears to be a draining abscess.  I placed her on oral doxycycline p.o. twice daily for the next 10 days.  Home care directions were reviewed.  We discussed that she should follow-up in the next 2 weeks with PCP, sooner if symptoms are worsening.  If symptoms have not resolved, low threshold to obtain ultrasound and diagnostic breast imaging.  We discussed that some breast cancers can appear similar.  I think this is less likely given history of abscess in other parts of the body but would not want to miss a breast cancer diagnosis.  She will schedule f/u w/ Dermatology regarding ongoing hives.   No orders of the defined types were placed in this encounter.  Meds ordered this encounter  Medications  . doxycycline (VIBRA-TABS) 100 MG tablet    Sig: Take 1 tablet (100 mg total) by mouth 2 (two) times daily.    Dispense:  20 tablet    Refill:  [place], DO [place] [phone]

## 2018-05-02 NOTE — Patient Instructions (Signed)
I am putting you on Doxycycline for the infection.  STOP Amoxicillin.  You may continue to use your other medications as prescribed for the hives.  Schedule a follow up with dermatology for the hives.  It sounds like further work up will be needed  See [name] in 2 weeks for recheck of breast.  We discussed that sometimes breast cancers can present as your breast infection is looking today.  I want to make sure this completely heals as expected and that we don't need to look at it closer with imaging studies.   Skin Abscess A skin abscess is an infected area on or under your skin that contains pus and other material. An abscess can happen almost anywhere on your body. Some abscesses break open (rupture) on their own. Most continue to get worse unless they are treated. The infection can spread deeper into the body and into your blood, which can make you feel sick. Treatment usually involves draining the abscess. Follow these instructions at home: Abscess Care  If you have an abscess that has not drained, place a warm, clean, wet washcloth over the abscess several times a day. Do this as told by your doctor.  Follow instructions from your doctor about how to take care of your abscess. Make sure you: ? Cover the abscess with a bandage (dressing). ? Change your bandage or gauze as told by your doctor. ? Wash your hands with soap and water before you change the bandage or gauze. If you cannot use soap and water, use hand sanitizer.  Check your abscess every day for signs that the infection is getting worse. Check for: ? More redness, swelling, or pain. ? More fluid or blood. ? Warmth. ? More pus or a bad smell. Medicines   Take over-the-counter and prescription medicines only as told by your doctor.  If you were prescribed an antibiotic medicine, take it as told by your doctor. Do not stop taking the antibiotic even if you start to feel better. General instructions  To avoid spreading the  infection: ? Do not share personal care items, towels, or hot tubs with others. ? Avoid making skin-to-skin contact with other people.  Keep all follow-up visits as told by your doctor. This is important. Contact a doctor if:  You have more redness, swelling, or pain around your abscess.  You have more fluid or blood coming from your abscess.  Your abscess feels warm when you touch it.  You have more pus or a bad smell coming from your abscess.  You have a fever.  Your muscles ache.  You have chills.  You feel sick. Get help right away if:  You have very bad (severe) pain.  You see red streaks on your skin spreading away from the abscess. This information is not intended to replace advice given to you by your health care provider. Make sure you discuss any questions you have with your health care provider. Document Released: 10/21/2007 Document Revised: 12/29/2015 Document Reviewed: 03/13/2015 Elsevier Interactive Patient Education  [employer].

## 2018-05-04 ENCOUNTER — Inpatient Hospital Stay
Admit: 2018-05-04 | Payer: PRIVATE HEALTH INSURANCE | Attending: Rehabilitative and Restorative Service Providers" | Primary: Family Medicine

## 2018-05-04 ENCOUNTER — Telehealth: Payer: Self-pay | Admitting: Neurology

## 2018-05-04 NOTE — Progress Notes (Signed)
PT DAILY TREATMENT NOTE 2-15    Patient Name: Sara Cooke  Date:05/04/18  DOB: 1971-04-24  [x]   Patient DOB Verified  Payor: Monia PouchAETNA / Plan: [employer] [place] EMPLOYEE PLAN / Product Type: PPO /    In time:332  Out time:438  Total Treatment Time (min): 66  Visit #:  2    Treatment Area: Pain in left shoulder [M25.512]    SUBJECTIVE  Pain Level (0-10 scale): 3  Any medication changes, allergies to medications, adverse drug reactions, diagnosis change, or new procedure performed?: [x]  No    []  Yes (see summary sheet for update)  Subjective functional status/changes:   []  No changes reported  Pt reports that she is still very stiff, but work has been getting a little better    OBJECTIVE    Modality rationale: decrease inflammation, decrease pain and increase tissue extensibility to improve the patient???s ability to complete all activity   Min Type Additional Details       []  Estim: [] Att   [] Unatt    [] TENS instruct                  [] IFC  [] Premod   [] NMES                     [] Other:  [] w/US   [] w/ice   [] w/heat  Position:  Location:       []   Traction: []  Cervical       [] Lumbar                       []  Prone          [] Supine                       [] Intermittent   [] Continuous Lbs:  []  before manual  []  after manual  [] w/heat    []   Ultrasound: [] Continuous   []  Pulsed                       at: [] 1MHz   [] 3MHz Location:  W/cm2:    []  Paraffin         Location:   [] w/heat    []   Ice     []   Heat  []   Ice massage Position:  Location:    []   Laser  []   Other: Position:  Location:     15 [x]   Vasopneumatic Device Pressure:       []  lo [x]  med []  hi   Temperature: 34     [x]  Skin assessment post-treatment:  [x] intact [x] redness- no adverse reaction    [] redness ??? adverse reaction:         18 min Therapeutic Exercise:  [x]  See flow sheet :   Rationale: increase ROM, increase strength and improve coordination to improve the patient???s ability to complete all activity       33 min Manual Therapy:  PROM with STM to UT and levator, rhomboids, inf and post GH mobs grade II, light distraction of the gh joint   Rationale: decrease pain, increase ROM, increase tissue extensibility, decrease trigger points and increase postural awareness  to improve the patient???s ability to complete all activity      With   []  TE   []  TA   []  neuro   []  other: Patient Education: [x]  Review HEP    []  Progressed/Changed HEP based on:   []   positioning   []  body mechanics   []  transfers   []  heat/ice application    []  other:      Pain Level (0-10 scale) post treatment: 2    ASSESSMENT/Changes in Function:   Pt is able to complete there-ex into greater ROM today specifically into flexion.  She is still limited more in abduction and rotations.  Will continue to progress in these directions to improve her ability to complete all activity and work duties  Patient will continue to benefit from skilled PT services to modify and progress therapeutic interventions, address functional mobility deficits, address ROM deficits, address strength deficits, analyze and address soft tissue restrictions, analyze and cue movement patterns, analyze and modify body mechanics/ergonomics, assess and modify postural abnormalities and address imbalance/dizziness to attain remaining goals.     []   See Plan of Care  []   See progress note/recertification  []   See Discharge Summary         PLAN  [x]   Upgrade activities as tolerated     [x]   Continue plan of care  [x]   Update interventions per flow sheet       []   Discharge due to:_  []   Other:_      Glenis Smokeravid M Joh Rao, PT 05/04/18

## 2018-05-04 NOTE — Telephone Encounter (Signed)
Called patient today to r/s her 2/27 appt for a CPAP follow up due to [place] being out. Patient informed me that she picked her machine up a few days before Thanksgiving and has been using it. She stated that when she picked her machine up, she told an employee that her appt had been scheduled for 2/27, and that person told her that she would need to have her follow up sooner. When I attempted to schedule something sooner, patient told me that she [place] of the state and leaving on 06/20/18 and will not be back until 04/20. I told her that I was unsure of how to handle the scheduling since she needed to be seen during a certain time frame according to guidelines and that I would speak to her nurse to figure out a good date, and then call her back.

## 2018-05-04 NOTE — Telephone Encounter (Signed)
I called pt, she started her cpap on 04/07/18. I scheduled her with Dr. Rexene Alberts on 06/15/18 at 9:30am. I reminded pt to use her cpap every night for at least four hours. Pt verbalized understanding of these recommendations and of new appt date and time.

## 2018-05-04 NOTE — Progress Notes (Signed)
 PT DAILY TREATMENT NOTE 2-15    Patient Name: Sara Cooke  Date:05/04/18  DOB: 1970/09/25  [x]   Patient DOB Verified  Payor: [employer]ULAN / Plan: [employer] [place] EMPLOYEE PLAN / Product Type: PPO /    In time:332  Out time:438  Total Treatment Time (min): 66  Visit #:  2    Treatment Area: Pain in left shoulder [M25.512]    SUBJECTIVE  Pain Level (0-10 scale): 3  Any medication changes, allergies to medications, adverse drug reactions, diagnosis change, or new procedure performed?: [x]  No    []  Yes (see summary sheet for update)  Subjective functional status/changes:   []  No changes reported  Pt reports that she is still very stiff, but work has been getting a little better    OBJECTIVE    Modality rationale: decrease inflammation, decrease pain and increase tissue extensibility to improve the patient's ability to complete all activity   Min Type Additional Details       []  Estim: [] Att   [] Unatt    [] TENS instruct                  [] IFC  [] Premod   [] NMES                     [] Other:  [] w/US    [] w/ice   [] w/heat  Position:  Location:       []   Traction: []  Cervical       [] Lumbar                       []  Prone          [] Supine                       [] Intermittent   [] Continuous Lbs:  []  before manual  []  after manual  [] w/heat    []   Ultrasound: [] Continuous   []  Pulsed                       at: []   [] Location:  W/cm2:    []  Paraffin         Location:   [] w/heat    []   Ice     []   Heat  []   Ice massage Position:  Location:    []   Laser  []   Other: Position:  Location:     15 [x]   Vasopneumatic Device Pressure:       []  lo [x]  med []  hi   Temperature: 34     [x]  Skin assessment post-treatment:  [x] intact [x] redness- no adverse reaction    [] redness - adverse reaction:         18 min Therapeutic Exercise:  [x]  See flow sheet :   Rationale: increase ROM, increase strength and improve coordination to improve the patient's ability to complete all activity      33 min Manual Therapy:  PROM with STM to UT and  levator, rhomboids, inf and post GH mobs grade II, light distraction of the gh joint   Rationale: decrease pain, increase ROM, increase tissue extensibility, decrease trigger points and increase postural awareness  to improve the patient's ability to complete all activity      With   []  TE   []  TA   []  neuro   []  other: Patient Education: [x]  Review HEP    []  Progressed/Changed HEP based on:   []   positioning   []  body mechanics   []  transfers   []  heat/ice application    []  other:      Pain Level (0-10 scale) post treatment: 2    ASSESSMENT/Changes in Function:   Pt is able to complete there-ex into greater ROM today specifically into flexion.  She is still limited more in abduction and rotations.  Will continue to progress in these directions to improve her ability to complete all activity and work duties  Patient will continue to benefit from skilled PT services to modify and progress therapeutic interventions, address functional mobility deficits, address ROM deficits, address strength deficits, analyze and address soft tissue restrictions, analyze and cue movement patterns, analyze and modify body mechanics/ergonomics, assess and modify postural abnormalities and address imbalance/dizziness to attain remaining goals.     []   See Plan of Care  []   See progress note/recertification  []   See Discharge Summary         PLAN  [x]   Upgrade activities as tolerated     [x]   Continue plan of care  [x]   Update interventions per flow sheet       []   Discharge due to:_  []   Other:_      Alm [name], PT 05/04/18

## 2018-05-05 DIAGNOSIS — F3181 Bipolar II disorder: Secondary | ICD-10-CM | POA: Insufficient documentation

## 2018-05-05 DIAGNOSIS — L501 Idiopathic urticaria: Secondary | ICD-10-CM | POA: Diagnosis not present

## 2018-05-07 DIAGNOSIS — G4733 Obstructive sleep apnea (adult) (pediatric): Secondary | ICD-10-CM | POA: Diagnosis not present

## 2018-05-10 ENCOUNTER — Inpatient Hospital Stay
Admit: 2018-05-10 | Payer: PRIVATE HEALTH INSURANCE | Attending: Rehabilitative and Restorative Service Providers" | Primary: Family Medicine

## 2018-05-10 NOTE — Progress Notes (Addendum)
PT DAILY TREATMENT NOTE 2-15    Patient Name: Sara Cooke  Date:05/10/2018  DOB: 08-01-1970  [x]   Patient DOB Verified  Payor: AETNA / Plan: [employer] [place] EMPLOYEE PLAN / Product Type: PPO /    In time:707  Out time:814  Total Treatment Time (min): 67  Visit #:  3    Treatment Area: Pain in left shoulder [M25.512]    SUBJECTIVE  Pain Level (0-10 scale): 3  Any medication changes, allergies to medications, adverse drug reactions, diagnosis change, or new procedure performed?: [x]  No    []  Yes (see summary sheet for update)  Subjective functional status/changes:   []  No changes reported  Pt reports that she is feeling better but still having pain    OBJECTIVE    Modality rationale: decrease inflammation, decrease pain and increase tissue extensibility to improve the patient???s ability to complete all activity   Min Type Additional Details       []  Estim: [] Att   [] Unatt    [] TENS instruct                  [] IFC  [] Premod   [] NMES                     [] Other:  [] w/US   [] w/ice   [] w/heat  Position:  Location:       []   Traction: []  Cervical       [] Lumbar                       []  Prone          [] Supine                       [] Intermittent   [] Continuous Lbs:  []  before manual  []  after manual  [] w/heat    []   Ultrasound: [] Continuous   []  Pulsed                       at: [] 1MHz   [] 3MHz Location:  W/cm2:    []  Paraffin         Location:   [] w/heat   10 [x]   Ice (10)     [x]   Heat(5)  []   Ice massage Position:long sit  Location:left shoulder    []   Laser  []   Other: Position:  Location:      []   Vasopneumatic Device Pressure:       []  lo []  med []  hi   Temperature:      [x]  Skin assessment post-treatment:  [x] intact [] redness- no adverse reaction    [] redness ??? adverse reaction:         27 min Therapeutic Exercise:  [x]  See flow sheet :   Rationale: increase ROM, increase strength, improve coordination, improve balance and increase proprioception to improve the patient???s ability to complete all activity     28 min Manual Therapy:  PROM with inf and post GH mobs, STM to UT and levator   Rationale: decrease pain, increase ROM, increase tissue extensibility, decrease trigger points and increase postural awareness  to improve the patient???s ability to complete all activity    Pain Level (0-10 scale) post treatment: 3-4    ASSESSMENT/Changes in Function:   Pt is able to tolerate there-ex with complaints of pain into abduction, ER, and flexion.  Will need to push into pain to improve ROM as she is  timid with manual treatment and motion.    Patient will continue to benefit from skilled PT services to modify and progress therapeutic interventions, address functional mobility deficits, address ROM deficits, address strength deficits, analyze and address soft tissue restrictions, analyze and cue movement patterns, analyze and modify body mechanics/ergonomics, assess and modify postural abnormalities and address imbalance/dizziness to attain remaining goals.     []   See Plan of Care  []   See progress note/recertification  []   See Discharge Summary         PLAN  [x]   Upgrade activities as tolerated     [x]   Continue plan of care  [x]   Update interventions per flow sheet       []   Discharge due to:_  []   Other:_      Glenis Smokeravid M Arien Benincasa, PT 05/10/2018

## 2018-05-10 NOTE — Progress Notes (Signed)
 PT DAILY TREATMENT NOTE 2-15    Patient Name: Sara Cooke  Date:05/10/2018  DOB: Dec 11, 1970  [x]   Patient DOB Verified  Payor: [employer] / Plan: [employer] [place] [employer]EE PLAN / Product Type: PPO /    In time:707  Out time:814  Total Treatment Time (min): 67  Visit #:  3    Treatment Area: Pain in left shoulder [M25.512]    SUBJECTIVE  Pain Level (0-10 scale): 3  Any medication changes, allergies to medications, adverse drug reactions, diagnosis change, or new procedure performed?: [x]  No    []  Yes (see summary sheet for update)  Subjective functional status/changes:   []  No changes reported  Pt reports that she is feeling better but still having pain    OBJECTIVE    Modality rationale: decrease inflammation, decrease pain and increase tissue extensibility to improve the patient's ability to complete all activity   Min Type Additional Details       []  Estim: [] Att   [] Unatt    [] TENS instruct                  [] IFC  [] Premod   [] NMES                     [] Other:  [] w/US    [] w/ice   [] w/heat  Position:  Location:       []   Traction: []  Cervical       [] Lumbar                       []  Prone          [] Supine                       [] Intermittent   [] Continuous Lbs:  []  before manual  []  after manual  [] w/heat    []   Ultrasound: [] Continuous   []  Pulsed                       at: []   [] Location:  W/cm2:    []  Paraffin         Location:   [] w/heat   10 [x]   Ice (10)     [x]   Heat(5)  []   Ice massage Position:long sit  Location:left shoulder    []   Laser  []   Other: Position:  Location:      []   Vasopneumatic Device Pressure:       []  lo []  med []  hi   Temperature:      [x]  Skin assessment post-treatment:  [x] intact [] redness- no adverse reaction    [] redness - adverse reaction:         27 min Therapeutic Exercise:  [x]  See flow sheet :   Rationale: increase ROM, increase strength, improve coordination, improve balance and increase proprioception to improve the patient's ability to complete all activity    28  min Manual Therapy:  PROM with inf and post GH mobs, STM to UT and levator   Rationale: decrease pain, increase ROM, increase tissue extensibility, decrease trigger points and increase postural awareness  to improve the patient's ability to complete all activity    Pain Level (0-10 scale) post treatment: 3-4    ASSESSMENT/Changes in Function:   Pt is able to tolerate there-ex with complaints of pain into abduction, ER, and flexion.  Will need to push into pain to improve ROM as she is  timid with manual treatment and motion.    Patient will continue to benefit from skilled PT services to modify and progress therapeutic interventions, address functional mobility deficits, address ROM deficits, address strength deficits, analyze and address soft tissue restrictions, analyze and cue movement patterns, analyze and modify body mechanics/ergonomics, assess and modify postural abnormalities and address imbalance/dizziness to attain remaining goals.     []   See Plan of Care  []   See progress note/recertification  []   See Discharge Summary         PLAN  [x]   Upgrade activities as tolerated     [x]   Continue plan of care  [x]   Update interventions per flow sheet       []   Discharge due to:_  []   Other:_      Alm [name], PT 05/10/2018

## 2018-05-13 DIAGNOSIS — Z1231 Encounter for screening mammogram for malignant neoplasm of breast: Secondary | ICD-10-CM | POA: Diagnosis not present

## 2018-05-13 LAB — HM MAMMOGRAPHY

## 2018-05-16 ENCOUNTER — Inpatient Hospital Stay: Admit: 2018-05-16 | Payer: PRIVATE HEALTH INSURANCE | Primary: Family Medicine

## 2018-05-16 NOTE — Progress Notes (Signed)
PT DAILY TREATMENT NOTE 2-15    Patient Name: Lovie Macadamiamy A Aho  Date:05/16/2018  DOB: 02-Apr-1971  [x]   Patient DOB Verified  Payor: [employer] / Plan: [employer] [place] [employer]OYEE PLAN / Product Type: PPO /    In time:734  Out time:840  Total Treatment Time (min): 66  Visit #:  4    Treatment Area: Pain in left shoulder [M25.512]    SUBJECTIVE  Pain Level (0-10 scale): 3/10  Any medication changes, allergies to medications, adverse drug reactions, diagnosis change, or new procedure performed?: [x]  No    []  Yes (see summary sheet for update)  Subjective functional status/changes:   []  No changes reported  Patient reports no changes since last visit    OBJECTIVE    Modality rationale: decrease inflammation, decrease pain and increase tissue extensibility to improve the patient???s ability to perform ADLs and reduce pain levels    Min Type Additional Details      ?? [] ? Estim: [] ?Att   [] ?Unatt    [] ?TENS instruct                  [] ?IFC  [] ?Premod   [] ?NMES                     [] ?Other:  [] ?w/US   [] ?w/ice   [] ?w/heat  Position:  Location:      ?? [] ?  Traction: [] ? Cervical       [] ?Lumbar                       [] ? Prone          [] ?Supine                       [] ?Intermittent   [] ?Continuous Lbs:  [] ? before manual  [] ? after manual  [] ?w/heat   ?? [] ?  Ultrasound: [] ?Continuous   [] ? Pulsed                       at: [] ?1MHz   [] ?3MHz Location:  W/cm2:   ?? [] ? Paraffin         Location:   [] ?w/heat    [] ?  Ice      [] ?  Heat  [] ?  Ice massage Position:  Location:   ?? [] ?  Laser  [] ?  Other: Position:  Location:   ??  ??15 [x] ?  Vasopneumatic Device Pressure:       [x] ? lo [] ? med [] ? hi   Temperature: 34   ??  [x] ? Skin assessment post-treatment:  [x] ?intact [] ?redness- no adverse reaction    [] ?redness ??? adverse reaction:   ??  ??  ??  36 min Therapeutic Exercise:  [x] ? See flow sheet :   Rationale: increase ROM, increase strength, improve coordination, improve balance and increase proprioception to improve the patient???s ability to  complete all activity  ??  15 min Manual Therapy:  PROM with inf and post GH mobs, STM to UT and levator   Rationale: decrease pain, increase ROM, increase tissue extensibility, decrease trigger points and increase postural awareness  to improve the patient???s ability to perform ADLs and reduce pain levels      With   []  TE   []  TA   []  neuro   []  other: Patient Education: [x]  Review HEP    []  Progressed/Changed HEP based on:   []  positioning   []  body mechanics   []   transfers   [] heat/ice application    [] other:      Other Objective/Functional Measures: none noted     Pain Level (0-10 scale) post treatment: 3/10    ASSESSMENT/Changes in Function:   Patient continues to be extremely guarded when going through manual. Very limited with cane ER. Will show upper trap compensation patterns with scapular retraction. Will continue to progress as tolerated.  Patient will continue to benefit from skilled PT services to modify and progress therapeutic interventions, address functional mobility deficits, address ROM deficits, address strength deficits, analyze and address soft tissue restrictions, analyze and cue movement patterns, analyze and modify body mechanics/ergonomics and assess and modify postural abnormalities to attain remaining goals.     [x]  See Plan of Care  []  See progress note/recertification  []  See Discharge Summary         Progress towards goals / Updated goals:  Patient is progressing towards goals.     PLAN  [x]  Upgrade activities as tolerated     [x]  Continue plan of care  [x]  Update interventions per flow sheet       []  Discharge due to:_  []  Other:_      Tahjay Binion T Jiles Goya 05/16/2018

## 2018-05-16 NOTE — Progress Notes (Signed)
PT DAILY TREATMENT NOTE 2-15    Patient Name: Sara Cooke  Date:05/16/2018  DOB: [birth year]  [x]   Patient DOB Verified  Payor: Monia Pouch / Plan: [employer] [place] [employer]OYEE PLAN / Product Type: PPO /    In time:734  Out time:840  Total Treatment Time (min): 66  Visit #:  4    Treatment Area: Pain in left shoulder [M25.512]    SUBJECTIVE  Pain Level (0-10 scale): 3/10  Any medication changes, allergies to medications, adverse drug reactions, diagnosis change, or new procedure performed?: [x]  No    []  Yes (see summary sheet for update)  Subjective functional status/changes:   []  No changes reported  Patient reports no changes since last visit    OBJECTIVE    Modality rationale: decrease inflammation, decrease pain and increase tissue extensibility to improve the patient's ability to perform ADLs and reduce pain levels    Min Type Additional Details       [] ? Estim: [] ?Att   [] ?Unatt    [] ?TENS instruct                  [] ?IFC  [] ?Premod   [] ?NMES                     [] ?Other:  [] ?w/US   [] ?w/ice   [] ?w/heat  Position:  Location:       [] ?  Traction: [] ? Cervical       [] ?Lumbar                       [] ? Prone          [] ?Supine                       [] ?Intermittent   [] ?Continuous Lbs:  [] ? before manual  [] ? after manual  [] ?w/heat    [] ?  Ultrasound: [] ?Continuous   [] ? Pulsed                       at: [] ?   [] ? Location:  W/cm2:    [] ? Paraffin         Location:   [] ?w/heat    [] ?  Ice      [] ?  Heat  [] ?  Ice massage Position:  Location:    [] ?  Laser  [] ?  Other: Position:  Location:     15 [x] ?  Vasopneumatic Device Pressure:       [x] ? lo [] ? med [] ? hi   Temperature: 34     [x] ? Skin assessment post-treatment:  [x] ?intact [] ?redness- no adverse reaction    [] ?redness - adverse reaction:         36 min Therapeutic Exercise:  [x] ? See flow sheet :   Rationale: increase ROM, increase strength, improve coordination, improve balance and increase proprioception to improve the patient's ability to  complete all activity    15 min Manual Therapy:  PROM with inf and post GH mobs, STM to UT and levator   Rationale: decrease pain, increase ROM, increase tissue extensibility, decrease trigger points and increase postural awareness  to improve the patient's ability to perform ADLs and reduce pain levels      With   []  TE   []  TA   []  neuro   []  other: Patient Education: [x]  Review HEP    []  Progressed/Changed HEP based on:   []  positioning   []  body mechanics   []   transfers   []  heat/ice application    []  other:      Other Objective/Functional Measures: none noted     Pain Level (0-10 scale) post treatment: 3/10    ASSESSMENT/Changes in Function:   Patient continues to be extremely guarded when going through manual. Very limited with cane ER. Will show upper trap compensation patterns with scapular retraction. Will continue to progress as tolerated.  Patient will continue to benefit from skilled PT services to modify and progress therapeutic interventions, address functional mobility deficits, address ROM deficits, address strength deficits, analyze and address soft tissue restrictions, analyze and cue movement patterns, analyze and modify body mechanics/ergonomics and assess and modify postural abnormalities to attain remaining goals.     [x]   See Plan of Care  []   See progress note/recertification  []   See Discharge Summary         Progress towards goals / Updated goals:  Patient is progressing towards goals.     PLAN  [x]   Upgrade activities as tolerated     [x]   Continue plan of care  [x]   Update interventions per flow sheet       []   Discharge due to:_  []   Other:_      [name] 05/16/2018

## 2018-05-19 ENCOUNTER — Inpatient Hospital Stay
Admit: 2018-05-19 | Payer: PRIVATE HEALTH INSURANCE | Attending: Rehabilitative and Restorative Service Providers" | Primary: Family Medicine

## 2018-05-19 DIAGNOSIS — M25512 Pain in left shoulder: Secondary | ICD-10-CM

## 2018-05-19 NOTE — Progress Notes (Signed)
PT DAILY TREATMENT NOTE 2-15    Patient Name: Sara Cooke  Date:05/19/2018  DOB: 24-Feb-1971  [x]   Patient DOB Verified  Payor: /    In time:732  Out time:838  Total Treatment Time (min): 66  Visit #:  5    Treatment Area: Pain in left shoulder [M25.512]    SUBJECTIVE  Pain Level (0-10 scale): 2/10  Any medication changes, allergies to medications, adverse drug reactions, diagnosis change, or new procedure performed?: [x]  No    []  Yes (see summary sheet for update)  Subjective functional status/changes:   []  No changes reported  Patient reports that she had her aleve this morning    OBJECTIVE           Modality rationale: decrease inflammation, decrease pain and increase tissue extensibility to improve the patient???s ability to perform ADLs and reduce pain levels    Min Type Additional Details      ?? [] ? Estim: [] ?Att   [] ?Unatt    [] ?TENS instruct                  [] ?IFC  [] ?Premod   [] ?NMES                     [] ?Other:  [] ?w/US   [] ?w/ice   [] ?w/heat  Position:  Location:      ?? [] ?  Traction: [] ? Cervical       [] ?Lumbar                       [] ? Prone          [] ?Supine                       [] ?Intermittent   [] ?Continuous Lbs:  [] ? before manual  [] ? after manual  [] ?w/heat   ?? [] ?  Ultrasound: [] ?Continuous   [] ? Pulsed                       at: [] ?   [] ? Location:  W/cm2:   ?? [] ? Paraffin         Location:   [] ?w/heat   10 [x] ?  Ice      [] ?  Heat  [] ?  Ice massage Position:seated  Location:left shoulder   ?? [] ?  Laser  [] ?  Other: Position:  Location:   ??  ?? [x] ?  Vasopneumatic Device Pressure:       [x] ? lo [] ? med [] ? hi   Temperature: 34   ??  [x] ? Skin assessment post-treatment:  [x] ?intact [] ?redness- no adverse reaction    [] ?redness ??? adverse reaction:   ??  ??  ??  18 min Therapeutic Exercise:  [x] ? See flow sheet :   Rationale: increase ROM, increase strength, improve coordination, improve balance and increase proprioception to improve the patient???s ability to complete all activity  ??   38 min Manual Therapy:  PROM with inf and post GH mobs, STM to UT and levator   Rationale: decrease pain, increase ROM, increase tissue extensibility, decrease trigger points and increase postural awareness  to improve the patient???s ability to perform ADLs and reduce pain levels      With   []  TE   []  TA   []  neuro   []  other: Patient Education: [x]  Review HEP    []  Progressed/Changed HEP based on:   []  positioning   []  body mechanics   []  transfers   []   heat/ice application    []  other:      Other Objective/Functional Measures: none noted     Pain Level (0-10 scale) post treatment: 2-3/10    ASSESSMENT/Changes in Function:   Patient continues to guard and have pain with ROM into ER and Abduction.  She has shown good progress with flexion.  Mobs are helping if she can stop guarding in the UT   Patient will continue to benefit from skilled PT services to modify and progress therapeutic interventions, address functional mobility deficits, address ROM deficits, address strength deficits, analyze and address soft tissue restrictions, analyze and cue movement patterns, analyze and modify body mechanics/ergonomics and assess and modify postural abnormalities to attain remaining goals.     [x]   See Plan of Care  []   See progress note/recertification  []   See Discharge Summary         Progress towards goals / Updated goals:  Patient is progressing towards goals.     PLAN  [x]   Upgrade activities as tolerated     [x]   Continue plan of care  [x]   Update interventions per flow sheet       []   Discharge due to:_  []   Other:_      [name], PT 05/19/2018

## 2018-05-19 NOTE — Progress Notes (Signed)
PT DAILY TREATMENT NOTE 2-15    Patient Name: Sara Cooke  Date:05/19/2018  DOB: 04-Nov-1970  [x]   Patient DOB Verified  Payor: /    In time:732  Out time:838  Total Treatment Time (min): 66  Visit #:  5    Treatment Area: Pain in left shoulder [M25.512]    SUBJECTIVE  Pain Level (0-10 scale): 2/10  Any medication changes, allergies to medications, adverse drug reactions, diagnosis change, or new procedure performed?: [x]  No    []  Yes (see summary sheet for update)  Subjective functional status/changes:   []  No changes reported  Patient reports that she had her aleve this morning    OBJECTIVE           Modality rationale: decrease inflammation, decrease pain and increase tissue extensibility to improve the patient's ability to perform ADLs and reduce pain levels    Min Type Additional Details       [] ? Estim: [] ?Att   [] ?Unatt    [] ?TENS instruct                  [] ?IFC  [] ?Premod   [] ?NMES                     [] ?Other:  [] ?w/US   [] ?w/ice   [] ?w/heat  Position:  Location:       [] ?  Traction: [] ? Cervical       [] ?Lumbar                       [] ? Prone          [] ?Supine                       [] ?Intermittent   [] ?Continuous Lbs:  [] ? before manual  [] ? after manual  [] ?w/heat    [] ?  Ultrasound: [] ?Continuous   [] ? Pulsed                       at: [] ?   [] ? Location:  W/cm2:    [] ? Paraffin         Location:   [] ?w/heat   10 [x] ?  Ice      [] ?  Heat  [] ?  Ice massage Position:seated  Location:left shoulder    [] ?  Laser  [] ?  Other: Position:  Location:      [x] ?  Vasopneumatic Device Pressure:       [x] ? lo [] ? med [] ? hi   Temperature: 34     [x] ? Skin assessment post-treatment:  [x] ?intact [] ?redness- no adverse reaction    [] ?redness - adverse reaction:         18 min Therapeutic Exercise:  [x] ? See flow sheet :   Rationale: increase ROM, increase strength, improve coordination, improve balance and increase proprioception to improve the patient's ability to complete all activity    38 min  Manual Therapy:  PROM with inf and post GH mobs, STM to UT and levator   Rationale: decrease pain, increase ROM, increase tissue extensibility, decrease trigger points and increase postural awareness  to improve the patient's ability to perform ADLs and reduce pain levels      With   []  TE   []  TA   []  neuro   []  other: Patient Education: [x]  Review HEP    []  Progressed/Changed HEP based on:   []  positioning   []  body mechanics   []  transfers   []   heat/ice application    []  other:      Other Objective/Functional Measures: none noted     Pain Level (0-10 scale) post treatment: 2-3/10    ASSESSMENT/Changes in Function:   Patient continues to guard and have pain with ROM into ER and Abduction.  She has shown good progress with flexion.  Mobs are helping if she can stop guarding in the UT   Patient will continue to benefit from skilled PT services to modify and progress therapeutic interventions, address functional mobility deficits, address ROM deficits, address strength deficits, analyze and address soft tissue restrictions, analyze and cue movement patterns, analyze and modify body mechanics/ergonomics and assess and modify postural abnormalities to attain remaining goals.     [x]   See Plan of Care  []   See progress note/recertification  []   See Discharge Summary         Progress towards goals / Updated goals:  Patient is progressing towards goals.     PLAN  [x]   Upgrade activities as tolerated     [x]   Continue plan of care  [x]   Update interventions per flow sheet       []   Discharge due to:_  []   Other:_      [name], PT 05/19/2018

## 2018-05-23 ENCOUNTER — Inpatient Hospital Stay: Admit: 2018-05-23 | Payer: PRIVATE HEALTH INSURANCE | Primary: Family Medicine

## 2018-05-23 DIAGNOSIS — L501 Idiopathic urticaria: Secondary | ICD-10-CM | POA: Diagnosis not present

## 2018-05-23 DIAGNOSIS — R768 Other specified abnormal immunological findings in serum: Secondary | ICD-10-CM | POA: Diagnosis not present

## 2018-05-23 NOTE — Progress Notes (Signed)
PT DAILY TREATMENT NOTE 2-15    Patient Name: Sara Cooke  Date:05/23/2018  DOB: November 18, 1970  [x]   Patient DOB Verified  Payor: [employer] / Plan: [employer] / Product Type: Commerical /    In time:735  Out time:846  Total Treatment Time (min): 71  Visit #:  6    Treatment Area: Pain in left shoulder [M25.512]    SUBJECTIVE  Pain Level (0-10 scale): 3/10  Any medication changes, allergies to medications, adverse drug reactions, diagnosis change, or new procedure performed?: [x]  No    []  Yes (see summary sheet for update)  Subjective functional status/changes:   []  No changes reported  Patient reports she was cleaning her house this weekend which caused some irritation but was manageable.     OBJECTIVE    Modality rationale: decrease inflammation and decrease pain to improve the patient???s ability to perform ADLs and reduce pain levels   Min Type Additional Details       []  Estim: [] Att   [] Unatt    [] TENS instruct                  [] IFC  [] Premod   [] NMES                     [] Other:  [] w/US   [] w/ice   [] w/heat  Position:  Location:       []   Traction: []  Cervical       [] Lumbar                       []  Prone          [] Supine                       [] Intermittent   [] Continuous Lbs:  []  before manual  []  after manual  [] w/heat    []   Ultrasound: [] Continuous   []  Pulsed                       at: []   [] Location:  W/cm2:    []  Paraffin         Location:   [] w/heat    []   Ice     []   Heat  []   Ice massage Position:  Location:    []   Laser  []   Other: Position:  Location:     15 [x]   Vasopneumatic Device Pressure:       []  lo [x]  med []  hi   Temperature: 34     [x]  Skin assessment post-treatment:  [x] intact [] redness- no adverse reaction    [] redness ??? adverse reaction:         41 min Therapeutic Exercise:  [x]  See flow sheet :   Rationale: increase ROM and increase strength to improve the patient???s ability to perform ADLs and reduce pain levels  ??   15 min Manual Therapy:????PROM with inf and post GH mobs, STM to UT and levator   Rationale:??decrease pain, increase ROM, increase tissue extensibility, decrease trigger points and increase postural awareness????to improve the patient???s ability to perform ADLs and reduce pain levels                   With   []  TE   []  TA   []  neuro   []  other: Patient Education: [x]  Review HEP    []  Progressed/Changed  HEP based on:   []  positioning   []  body mechanics   []  transfers   []  heat/ice application    []  other:      Other Objective/Functional Measures: none noted     Pain Level (0-10 scale) post treatment: 2/10    ASSESSMENT/Changes in Function:   Patient continues to show extreme guarding limiting her PROM. Fair scapular stability. Will continue to progress as tolerated.  Patient will continue to benefit from skilled PT services to modify and progress therapeutic interventions, address functional mobility deficits, address ROM deficits, address strength deficits, analyze and address soft tissue restrictions, analyze and cue movement patterns, analyze and modify body mechanics/ergonomics and assess and modify postural abnormalities to attain remaining goals.     [x]   See Plan of Care  []   See progress note/recertification  []   See Discharge Summary         Progress towards goals / Updated goals:  Patient is progressing towards goals.     PLAN  [x]   Upgrade activities as tolerated     [x]   Continue plan of care  [x]   Update interventions per flow sheet       []   Discharge due to:_  []   Other:_  Chase Picket T Layonna Dobie 05/23/2018

## 2018-05-23 NOTE — Progress Notes (Signed)
 PT DAILY TREATMENT NOTE 2-15    Patient Name: Sara Cooke  Date:05/23/2018  DOB: 1970-09-15  [x]   Patient DOB Verified  Payor: [employer] [place]  / Plan: [employer] / Product Type: Commerical /    In time:735  Out time:846  Total Treatment Time (min): 71  Visit #:  6    Treatment Area: Pain in left shoulder [M25.512]    SUBJECTIVE  Pain Level (0-10 scale): 3/10  Any medication changes, allergies to medications, adverse drug reactions, diagnosis change, or new procedure performed?: [x]  No    []  Yes (see summary sheet for update)  Subjective functional status/changes:   []  No changes reported  Patient reports she was cleaning her house this weekend which caused some irritation but was manageable.     OBJECTIVE    Modality rationale: decrease inflammation and decrease pain to improve the patient's ability to perform ADLs and reduce pain levels   Min Type Additional Details       []  Estim: [] Att   [] Unatt    [] TENS instruct                  [] IFC  [] Premod   [] NMES                     [] Other:  [] w/US    [] w/ice   [] w/heat  Position:  Location:       []   Traction: []  Cervical       [] Lumbar                       []  Prone          [] Supine                       [] Intermittent   [] Continuous Lbs:  []  before manual  []  after manual  [] w/heat    []   Ultrasound: [] Continuous   []  Pulsed                       at: []   [] Location:  W/cm2:    []  Paraffin         Location:   [] w/heat    []   Ice     []   Heat  []   Ice massage Position:  Location:    []   Laser  []   Other: Position:  Location:     15 [x]   Vasopneumatic Device Pressure:       []  lo [x]  med []  hi   Temperature: 34     [x]  Skin assessment post-treatment:  [x] intact [] redness- no adverse reaction    [] redness - adverse reaction:         41 min Therapeutic Exercise:  [x]  See flow sheet :   Rationale: increase ROM and increase strength to improve the patient's ability to perform ADLs and reduce pain levels    15 min Manual  Therapy:PROM with inf and post GH mobs, STM to UT and levator   Rationale:decrease pain, increase ROM, increase tissue extensibility, decrease trigger points and increase postural awarenessto improve the patient's ability to perform ADLs and reduce pain levels                   With   []  TE   []  TA   []  neuro   []  other: Patient Education: [x]  Review HEP    []  Progressed/Changed  HEP based on:   []  positioning   []  body mechanics   []  transfers   []  heat/ice application    []  other:      Other Objective/Functional Measures: none noted     Pain Level (0-10 scale) post treatment: 2/10    ASSESSMENT/Changes in Function:   Patient continues to show extreme guarding limiting her PROM. Fair scapular stability. Will continue to progress as tolerated.  Patient will continue to benefit from skilled PT services to modify and progress therapeutic interventions, address functional mobility deficits, address ROM deficits, address strength deficits, analyze and address soft tissue restrictions, analyze and cue movement patterns, analyze and modify body mechanics/ergonomics and assess and modify postural abnormalities to attain remaining goals.     [x]   See Plan of Care  []   See progress note/recertification  []   See Discharge Summary         Progress towards goals / Updated goals:  Patient is progressing towards goals.     PLAN  [x]   Upgrade activities as tolerated     [x]   Continue plan of care  [x]   Update interventions per flow sheet       []   Discharge due to:_  []   Other:_  malva Katz T Isom 05/23/2018

## 2018-05-24 ENCOUNTER — Ambulatory Visit (INDEPENDENT_AMBULATORY_CARE_PROVIDER_SITE_OTHER): Payer: BLUE CROSS/BLUE SHIELD

## 2018-05-24 ENCOUNTER — Ambulatory Visit (INDEPENDENT_AMBULATORY_CARE_PROVIDER_SITE_OTHER): Payer: BLUE CROSS/BLUE SHIELD | Admitting: Physician Assistant

## 2018-05-24 ENCOUNTER — Encounter: Payer: Self-pay | Admitting: Physician Assistant

## 2018-05-24 ENCOUNTER — Other Ambulatory Visit: Payer: Self-pay | Admitting: Physician Assistant

## 2018-05-24 ENCOUNTER — Ambulatory Visit: Payer: BLUE CROSS/BLUE SHIELD | Admitting: Physician Assistant

## 2018-05-24 VITALS — BP 132/92 | HR 88 | Temp 98.4°F | Ht 68.0 in | Wt 248.6 lb

## 2018-05-24 DIAGNOSIS — Z01818 Encounter for other preprocedural examination: Secondary | ICD-10-CM

## 2018-05-24 DIAGNOSIS — I10 Essential (primary) hypertension: Secondary | ICD-10-CM | POA: Diagnosis not present

## 2018-05-24 DIAGNOSIS — E119 Type 2 diabetes mellitus without complications: Secondary | ICD-10-CM | POA: Diagnosis not present

## 2018-05-24 DIAGNOSIS — B379 Candidiasis, unspecified: Secondary | ICD-10-CM | POA: Diagnosis not present

## 2018-05-24 DIAGNOSIS — M1711 Unilateral primary osteoarthritis, right knee: Secondary | ICD-10-CM

## 2018-05-24 DIAGNOSIS — J984 Other disorders of lung: Secondary | ICD-10-CM | POA: Diagnosis not present

## 2018-05-24 LAB — BAYER DCA HB A1C WAIVED: HB A1C (BAYER DCA - WAIVED): 6.8 % (ref <7.0)

## 2018-05-24 MED ORDER — FLUCONAZOLE 150 MG PO TABS: ORAL_TABLET | ORAL | 2 refills | Status: DC

## 2018-05-24 NOTE — Progress Notes (Signed)
.   BP (!) 132/92   Pulse 88   Temp 98.4 F (36.9 C) (Oral)   Ht _0  (1.727 m)   Wt 248 lb 9.6 oz (112.8 kg)   BMI 37.80 kg/m    Subjective:    Patient ID: Judith [name], female    DOB: 02-Nov-1970, 48 y.o.   MRN: [id number]  HPI: Judith [name] is a 48 y.o. female presenting on 05/24/2018 for surgical clearance  Judith [name] is a 48 y.o. female presenting on 01/04/2018 for Surgical clearance  Judith [name] comes in today for surgical clearance to have her left knee replaced.  Through her company's benefits she will be going to another state to have that performed and then will return here.  We will do the primary aftercare and continue her FMLA and disability claims.    She reports that she is up a few more pounds.  She is still below the BMI that they requested.  And it appears to be doing very well overall with her diabetes and other conditions.  We will have labs drawn appropriately.  We have also performed a chest x-ray and EKG today and they are within normal limits.   She also does have limited amount of tramadol she uses for very severe episodes  Past Medical History:  Diagnosis Date  . Asthma   . Bipolar 1 disorder ([place])   . Depression   . GERD (gastroesophageal reflux disease)   . Hyperlipemia   . Hypothyroid   . Migraines    Relevant past medical, surgical, family and social history reviewed and updated as indicated. Interim medical history since our last visit reviewed. Allergies and medications reviewed and updated. DATA REVIEWED: CHART IN EPIC  Family History reviewed for pertinent findings.  Review of Systems  Constitutional: Negative.  Negative for activity change, fatigue and fever.  HENT: Negative.   Eyes: Negative.   Respiratory: Negative.  Negative for cough.   Cardiovascular: Negative.  Negative for chest pain.  Gastrointestinal: Negative.  Negative for abdominal pain.  Endocrine: Negative.   Genitourinary: Negative.  Negative for dysuria.    Musculoskeletal: Positive for arthralgias and back pain.  Skin: Negative.   Neurological: Negative.     Allergies as of 05/24/2018      Reactions   Diphenhydramine Other (See Comments)   Hair feels like it is crawling   Morphine Nausea And Vomiting   PCA PUMP N/V IV PUSH IN ER NO PROBLEM PER PT.   Prednisone Nausea And Vomiting   Erythromycin Hives   Sulfonamide Derivatives Other (See Comments)   Does not take due to family history      Medication List       Accurate as of May 24, 2018 11:59 PM. Always use your most recent med list.        albuterol (2.5 MG/3ML) 0.083% nebulizer solution Commonly known as:  PROVENTIL Nebulize 1 vial three times daily   aspirin EC 81 MG tablet Take 162 mg by mouth daily.   atorvastatin 10 MG tablet Commonly known as:  LIPITOR Take 1 tablet (10 mg total) by mouth daily.   budesonide-formoterol 160-4.5 MCG/ACT inhaler Commonly known as:  SYMBICORT Inhale 2 puffs into the lungs 2 (two) times daily.   busPIRone 15 MG tablet Commonly known as:  BUSPAR Take 15 mg by mouth 3 (three) times daily.   cetirizine 10 MG tablet Commonly known as:  ZYRTEC Take 1 tablet (10 mg total) by mouth daily.  diclofenac sodium 1 % Gel Commonly known as:  VOLTAREN Apply 4 g topically 4 (four) times daily.   dicyclomine 20 MG tablet Commonly known as:  BENTYL Take 1 tablet (20 mg total) by mouth every 6 (six) hours.   doxepin 25 MG capsule Commonly known as:  SINEQUAN Take by mouth.   doxycycline 100 MG tablet Commonly known as:  VIBRA-TABS Take 1 tablet (100 mg total) by mouth 2 (two) times daily.   fluconazole 150 MG tablet Commonly known as:  DIFLUCAN 1 po q week x 4 weeks   hydrOXYzine 25 MG tablet Commonly known as:  ATARAX/VISTARIL Take 1 tablet (25 mg total) by mouth daily.   ibuprofen 800 MG tablet Commonly known as:  ADVIL,MOTRIN Take 1 tablet (800 mg total) by mouth every 8 (eight) hours as needed.   lamoTRIgine 150 MG  tablet Commonly known as:  LAMICTAL Take 150 mg by mouth 2 (two) times daily.   levothyroxine 125 MCG tablet Commonly known as:  SYNTHROID, LEVOTHROID Take 1 tablet (125 mcg total) by mouth daily.   lisinopril 20 MG tablet Commonly known as:  PRINIVIL,ZESTRIL Take 20 mg by mouth daily.   mometasone 0.1 % cream Commonly known as:  ELOCON Apply 1 application topically daily. x10 days   ondansetron 8 MG disintegrating tablet Commonly known as:  ZOFRAN ODT Take 1 tablet (8 mg total) by mouth every 8 (eight) hours as needed for nausea or vomiting.   pantoprazole 40 MG tablet Commonly known as:  PROTONIX Take 1 tablet (40 mg total) by mouth 2 (two) times daily.   pioglitazone 15 MG tablet Commonly known as:  ACTOS Take 1 tablet (15 mg total) by mouth daily.   sertraline 100 MG tablet Commonly known as:  ZOLOFT Take 200 mg by mouth daily.   silver sulfADIAZINE 1 % cream Commonly known as:  SILVADENE Apply topically.   SUMAtriptan Succinate 4 MG/0.5ML Soct Inject 4 mg into the skin once.   traMADol 50 MG tablet Commonly known as:  ULTRAM Take 1 tablet (50 mg total) by mouth every 8 (eight) hours as needed for severe pain.   VYVANSE PO Take 70 mg by mouth daily.          Objective:    BP (!) 132/92   Pulse 88   Temp 98.4 F (36.9 C) (Oral)   Ht _0  (1.727 m)   Wt 248 lb 9.6 oz (112.8 kg)   BMI 37.80 kg/m   Allergies  Allergen Reactions  . Diphenhydramine Other (See Comments)    Hair feels like it is crawling  . Morphine Nausea And Vomiting    PCA PUMP N/V IV PUSH IN ER NO PROBLEM PER PT.  . Prednisone Nausea And Vomiting  . Erythromycin Hives  . Sulfonamide Derivatives Other (See Comments)    Does not take due to family history    Wt Readings from Last 3 Encounters:  05/24/18 248 lb 9.6 oz (112.8 kg)  05/02/18 247 lb (112 kg)  04/05/18 246 lb 6.4 oz (111.8 kg)    Physical Exam Constitutional:      Appearance: She is well-developed.  HENT:      Head: Normocephalic and atraumatic.  Eyes:     Conjunctiva/sclera: Conjunctivae normal.     Pupils: Pupils are equal, round, and reactive to light.  Cardiovascular:     Rate and Rhythm: Normal rate and regular rhythm.     Heart sounds: Normal heart sounds.  Pulmonary:  Effort: Pulmonary effort is normal.     Breath sounds: Normal breath sounds.  Abdominal:     General: Bowel sounds are normal.     Palpations: Abdomen is soft.  Musculoskeletal:     Right knee: She exhibits decreased range of motion, swelling and bony tenderness. Tenderness found.  Skin:    General: Skin is warm and dry.     Findings: No rash.  Neurological:     Mental Status: She is alert and oriented to person, place, and time.     Deep Tendon Reflexes: Reflexes are normal and symmetric.  Psychiatric:        Behavior: Behavior normal.        Thought Content: Thought content normal.        Judgment: Judgment normal.     Results for orders placed or performed in visit on 05/24/18  CBC with Differential/Platelet  Result Value Ref Range   WBC 9.5 3.4 - 10.8 x10E3/uL   RBC 5.48 (H) 3.77 - 5.28 x10E6/uL   Hemoglobin 14.5 11.1 - 15.9 g/dL   Hematocrit 43.0 34.0 - 46.6 %   MCV 79 79 - 97 fL   MCH 26.5 (L) 26.6 - 33.0 pg   MCHC 33.7 31.5 - 35.7 g/dL   RDW 13.0 11.7 - 15.4 %   Platelets 371 150 - 450 x10E3/uL   Neutrophils 53 Not Estab. %   Lymphs 36 Not Estab. %   Monocytes 8 Not Estab. %   Eos 2 Not Estab. %   Basos 1 Not Estab. %   Neutrophils Absolute 5.0 1.4 - 7.0 x10E3/uL   Lymphocytes Absolute 3.4 (H) 0.7 - 3.1 x10E3/uL   Monocytes Absolute 0.8 0.1 - 0.9 x10E3/uL   EOS (ABSOLUTE) 0.2 0.0 - 0.4 x10E3/uL   Basophils Absolute 0.1 0.0 - 0.2 x10E3/uL   Immature Granulocytes 0 Not Estab. %   Immature Grans (Abs) 0.0 0.0 - 0.1 x10E3/uL  CMP14+EGFR  Result Value Ref Range   Glucose 98 65 - 99 mg/dL   BUN 16 6 - 24 mg/dL   Creatinine, Ser 0.84 0.57 - 1.00 mg/dL   GFR calc non Af Amer 82 >59  mL/min/1.73   GFR calc Af Amer 95 >59 mL/min/1.73   BUN/Creatinine Ratio 19 9 - 23   Sodium 139 134 - 144 mmol/L   Potassium 4.1 3.5 - 5.2 mmol/L   Chloride 95 (L) 96 - 106 mmol/L   CO2 27 20 - 29 mmol/L   Calcium 10.0 8.7 - 10.2 mg/dL   Total Protein 7.8 6.0 - 8.5 g/dL   Albumin 4.7 3.5 - 5.5 g/dL   Globulin, Total 3.1 1.5 - 4.5 g/dL   Albumin/Globulin Ratio 1.5 1.2 - 2.2   Bilirubin Total <0.2 0.0 - 1.2 mg/dL   Alkaline Phosphatase 98 39 - 117 IU/L   AST 13 0 - 40 IU/L   ALT 11 0 - 32 IU/L  Microalbumin / creatinine urine ratio  Result Value Ref Range   Creatinine, Urine 170.6 Not Estab. mg/dL   Microalbumin, Urine 11.3 Not Estab. ug/mL   Microalb/Creat Ratio 6.6 0.0 - 30.0 mg/g creat  Bayer DCA Hb A1c Waived  Result Value Ref Range   HB A1C (BAYER DCA - WAIVED) 6.8 <7.0 %      Assessment & Plan:   1. Pre-operative clearance - EKG 12-Lead - DG Chest 2 View; Future - CBC with Differential/Platelet - CMP14+EGFR - Microalbumin / creatinine urine ratio - Bayer DCA Hb A1c Waived  2. Yeast infection - fluconazole (DIFLUCAN) 150 MG tablet; 1 po q week x 4 weeks  Dispense: 4 tablet; Refill: 2  3. Essential hypertension - CMP14+EGFR  4. Type 2 diabetes mellitus without complication, without long-term current use of insulin (HCC) - CMP14+EGFR - Microalbumin / creatinine urine ratio - Bayer DCA Hb A1c Waived  5. Primary osteoarthritis of right knee Replacment soon  Medically clear to have surgery  Continue all other maintenance medications as listed above.  Follow up plan: Return in about 3 months (around 08/23/2018).  Educational handout given for [place] PA-C [place] [address]  [place], [place] [address] [phone]   05/27/2018, 1:55 PM

## 2018-05-25 LAB — CBC WITH DIFFERENTIAL/PLATELET
Basophils Absolute: 0.1 10*3/uL (ref 0.0–0.2)
Basos: 1 %
EOS (ABSOLUTE): 0.2 10*3/uL (ref 0.0–0.4)
Eos: 2 %
HEMOGLOBIN: 14.5 g/dL (ref 11.1–15.9)
Hematocrit: 43 % (ref 34.0–46.6)
Immature Grans (Abs): 0 10*3/uL (ref 0.0–0.1)
Immature Granulocytes: 0 %
LYMPHS ABS: 3.4 10*3/uL — AB (ref 0.7–3.1)
Lymphs: 36 %
MCH: 26.5 pg — ABNORMAL LOW (ref 26.6–33.0)
MCHC: 33.7 g/dL (ref 31.5–35.7)
MCV: 79 fL (ref 79–97)
Monocytes Absolute: 0.8 10*3/uL (ref 0.1–0.9)
Monocytes: 8 %
Neutrophils Absolute: 5 10*3/uL (ref 1.4–7.0)
Neutrophils: 53 %
Platelets: 371 10*3/uL (ref 150–450)
RBC: 5.48 x10E6/uL — ABNORMAL HIGH (ref 3.77–5.28)
RDW: 13 % (ref 11.7–15.4)
WBC: 9.5 10*3/uL (ref 3.4–10.8)

## 2018-05-25 LAB — CMP14+EGFR
ALBUMIN: 4.7 g/dL (ref 3.5–5.5)
ALT: 11 IU/L (ref 0–32)
AST: 13 IU/L (ref 0–40)
Albumin/Globulin Ratio: 1.5 (ref 1.2–2.2)
Alkaline Phosphatase: 98 IU/L (ref 39–117)
BUN/Creatinine Ratio: 19 (ref 9–23)
BUN: 16 mg/dL (ref 6–24)
Bilirubin Total: <0.2 mg/dL (ref 0.0–1.2)
CO2: 27 mmol/L (ref 20–29)
Calcium: 10 mg/dL (ref 8.7–10.2)
Chloride: 95 mmol/L — ABNORMAL LOW (ref 96–106)
Creatinine, Ser: 0.84 mg/dL (ref 0.57–1.00)
GFR calc Af Amer: 95 mL/min/{1.73_m2} (ref >59)
GFR, EST NON AFRICAN AMERICAN: 82 mL/min/{1.73_m2} (ref >59)
Globulin, Total: 3.1 g/dL (ref 1.5–4.5)
Glucose: 98 mg/dL (ref 65–99)
Potassium: 4.1 mmol/L (ref 3.5–5.2)
Sodium: 139 mmol/L (ref 134–144)
Total Protein: 7.8 g/dL (ref 6.0–8.5)

## 2018-05-25 LAB — MICROALBUMIN / CREATININE URINE RATIO
Creatinine, Urine: 170.6 mg/dL
Microalb/Creat Ratio: 6.6 mg/g creat (ref 0.0–30.0)
Microalbumin, Urine: 11.3 ug/mL

## 2018-05-25 MED ORDER — ONDANSETRON 8 MG PO TBDP: 8.0000 mg | ORAL_TABLET | Freq: Three times a day (TID) | ORAL | 0 refills | Status: DC | PRN

## 2018-05-26 ENCOUNTER — Inpatient Hospital Stay
Admit: 2018-05-26 | Payer: PRIVATE HEALTH INSURANCE | Attending: Rehabilitative and Restorative Service Providers" | Primary: Family Medicine

## 2018-05-26 NOTE — Progress Notes (Signed)
 PT DAILY TREATMENT NOTE 2-15    Patient Name: Sara Cooke  Date:05/26/2018  DOB: 12/16/1970  [x]   Patient DOB Verified  Payor: [employer] [place]  / Plan: [employer] / Product Type: Commerical /    In time:700  Out time:821  Total Treatment Time (min): 81  Visit #:  7    Treatment Area: Pain in left shoulder [M25.512]    SUBJECTIVE  Pain Level (0-10 scale): 2/10  Any medication changes, allergies to medications, adverse drug reactions, diagnosis change, or new procedure performed?: [x]  No    []  Yes (see summary sheet for update)  Subjective functional status/changes:   []  No changes reported  Pt is without new report.  States that she is getting better.     OBJECTIVE    Modality rationale: decrease inflammation and decrease pain to improve the patient's ability to perform ADLs and reduce pain levels   Min Type Additional Details       []  Estim: [] Att   [] Unatt    [] TENS instruct                  [] IFC  [] Premod   [] NMES                     [] Other:  [] w/US    [] w/ice   [] w/heat  Position:  Location:       []   Traction: []  Cervical       [] Lumbar                       []  Prone          [] Supine                       [] Intermittent   [] Continuous Lbs:  []  before manual  []  after manual  [] w/heat    []   Ultrasound: [] Continuous   []  Pulsed                       at: []   [] Location:  W/cm2:    []  Paraffin         Location:   [] w/heat    []   Ice     []   Heat  []   Ice massage Position:  Location:    []   Laser  []   Other: Position:  Location:     15 [x]   Vasopneumatic Device Pressure:       []  lo [x]  med []  hi   Temperature: 34     [x]  Skin assessment post-treatment:  [x] intact [] redness- no adverse reaction    [] redness - adverse reaction:         46 min Therapeutic Exercise:  [x]  See flow sheet :   Rationale: increase ROM and increase strength to improve the patient's ability to perform ADLs and reduce pain levels    20 min Manual Therapy:PROM with inf and post GH mobs, STM  to UT and levator   Rationale:decrease pain, increase ROM, increase tissue extensibility, decrease trigger points and increase postural awarenessto improve the patient's ability to perform ADLs and reduce pain levels                   With   []  TE   []  TA   []  neuro   []  other: Patient Education: [x]  Review HEP    []  Progressed/Changed HEP based on:   []   positioning   []  body mechanics   []  transfers   []  heat/ice application    []  other:      Other Objective/Functional Measures: none noted     Pain Level (0-10 scale) post treatment: 2/10    ASSESSMENT/Changes in Function:   Pt is progressing slowly.  Hindered by guarding and pain at ~140 degrees flex, ~120 abd, and ~45 ER.  Will continue to progress and have explained to patient that we will need to push through some of the discomfort to gain ROM    Patient will continue to benefit from skilled PT services to modify and progress therapeutic interventions, address functional mobility deficits, address ROM deficits, address strength deficits, analyze and address soft tissue restrictions, analyze and cue movement patterns, analyze and modify body mechanics/ergonomics and assess and modify postural abnormalities to attain remaining goals.     [x]   See Plan of Care  []   See progress note/recertification  []   See Discharge Summary         Progress towards goals / Updated goals:  Patient is progressing towards goals.     PLAN  [x]   Upgrade activities as tolerated     [x]   Continue plan of care  [x]   Update interventions per flow sheet       []   Discharge due to:_  []   Other:_  o    Alm [name], PT 05/26/2018

## 2018-05-26 NOTE — Progress Notes (Signed)
PT DAILY TREATMENT NOTE 2-15    Patient Name: Sara Cooke  Date:05/26/2018  DOB: Oct 27, 1970  [x]   Patient DOB Verified  Payor: [employer] [place] / Plan: [employer] / Product Type: Commerical /    In time:700  Out time:821  Total Treatment Time (min): 81  Visit #:  7    Treatment Area: Pain in left shoulder [M25.512]    SUBJECTIVE  Pain Level (0-10 scale): 2/10  Any medication changes, allergies to medications, adverse drug reactions, diagnosis change, or new procedure performed?: [x]  No    []  Yes (see summary sheet for update)  Subjective functional status/changes:   []  No changes reported  Pt is without new report.  States that she is getting better.     OBJECTIVE    Modality rationale: decrease inflammation and decrease pain to improve the patient???s ability to perform ADLs and reduce pain levels   Min Type Additional Details       []  Estim: [] Att   [] Unatt    [] TENS instruct                  [] IFC  [] Premod   [] NMES                     [] Other:  [] w/US   [] w/ice   [] w/heat  Position:  Location:       []   Traction: []  Cervical       [] Lumbar                       []  Prone          [] Supine                       [] Intermittent   [] Continuous Lbs:  []  before manual  []  after manual  [] w/heat    []   Ultrasound: [] Continuous   []  Pulsed                       at: []   [] Location:  W/cm2:    []  Paraffin         Location:   [] w/heat    []   Ice     []   Heat  []   Ice massage Position:  Location:    []   Laser  []   Other: Position:  Location:     15 [x]   Vasopneumatic Device Pressure:       []  lo [x]  med []  hi   Temperature: 34     [x]  Skin assessment post-treatment:  [x] intact [] redness- no adverse reaction    [] redness ??? adverse reaction:         46 min Therapeutic Exercise:  [x]  See flow sheet :   Rationale: increase ROM and increase strength to improve the patient???s ability to perform ADLs and reduce pain levels  ??   20 min Manual Therapy:????PROM with inf and post GH mobs, STM to UT and levator   Rationale:??decrease pain, increase ROM, increase tissue extensibility, decrease trigger points and increase postural awareness????to improve the patient???s ability to perform ADLs and reduce pain levels                   With   []  TE   []  TA   []  neuro   []  other: Patient Education: [x]  Review HEP    []  Progressed/Changed HEP based on:   []   positioning   []  body mechanics   []  transfers   []  heat/ice application    []  other:      Other Objective/Functional Measures: none noted     Pain Level (0-10 scale) post treatment: 2/10    ASSESSMENT/Changes in Function:   Pt is progressing slowly.  Hindered by guarding and pain at ~140 degrees flex, ~120 abd, and ~45 ER.  Will continue to progress and have explained to patient that we will need to push through some of the discomfort to gain ROM    Patient will continue to benefit from skilled PT services to modify and progress therapeutic interventions, address functional mobility deficits, address ROM deficits, address strength deficits, analyze and address soft tissue restrictions, analyze and cue movement patterns, analyze and modify body mechanics/ergonomics and assess and modify postural abnormalities to attain remaining goals.     [x]   See Plan of Care  []   See progress note/recertification  []   See Discharge Summary         Progress towards goals / Updated goals:  Patient is progressing towards goals.     PLAN  [x]   Upgrade activities as tolerated     [x]   Continue plan of care  [x]   Update interventions per flow sheet       []   Discharge due to:_  []   Other:_  o    [name], PT 05/26/2018

## 2018-05-27 ENCOUNTER — Telehealth: Payer: Self-pay | Admitting: Physician Assistant

## 2018-05-30 ENCOUNTER — Inpatient Hospital Stay: Admit: 2018-05-30 | Payer: PRIVATE HEALTH INSURANCE | Primary: Family Medicine

## 2018-05-30 NOTE — Progress Notes (Signed)
PT DAILY TREATMENT NOTE 2-15    Patient Name: Sara Cooke  Date:05/30/2018  DOB: 03/20/1971  _0   Patient DOB Verified  Payor: [place] / Plan: [employer] [place] / Product Type: Commerical /    In time:743  Out time:850  Total Treatment Time (min): 71  Visit #:  8    Treatment Area: Pain in left shoulder [M25.512]    SUBJECTIVE  Pain Level (0-10 scale): 3/10  Any medication changes, allergies to medications, adverse drug reactions, diagnosis change, or new procedure performed?: _1  No    _2  Yes (see summary sheet for update)  Subjective functional status/changes:   _3  No changes reported  Patient believes she is 75% better.  Patient was able to lift her arms above her head at church.     OBJECTIVE    Modality rationale: decrease inflammation and decrease pain to improve the patient???s ability to perform ADLs and reduce pain levels    Min Type Additional Details      ?? _4 ? Estim: _5 ?Att   _6 ?Unatt    _7 ?TENS instruct                  _8 ?IFC  _9 ?Premod   _10 ?NMES                     _11 ?Other:  _12 ?w/US   _13 ?w/ice   _14 ?w/heat  Position:  Location:      ?? _15 ?  Traction: _16 ? Cervical       _17 ?Lumbar                       _18 ? Prone          _19 ?Supine                       _20 ?Intermittent   _21 ?Continuous Lbs:  _22 ? before manual  _23 ? after manual  _24 ?w/heat   ?? _25 ?  Ultrasound: _26 ?Continuous   _27 ? Pulsed                       at: _28 ?1MHz   _29 ?3MHz Location:  W/cm2:   ?? _30 ? Paraffin         Location:   _31 ?w/heat   ?? _32 ?  Ice     _33 ?  Heat  _34 ?  Ice massage Position:  Location:   ?? _35 ?  Laser  _36 ?  Other: Position:  Location:   ??  15 _37 ?  Vasopneumatic Device Pressure:       _38 ? lo _39 ? med _40 ? hi   Temperature: 34   ??  _41 ? Skin assessment post-treatment:  _42 ?intact _43 ?redness- no adverse reaction    _44 ?redness ??? adverse reaction:   ??  ??  ??  37 min Therapeutic Exercise:  _45 ? See flow sheet :   Rationale: increase ROM and increase strength to improve the patient???s  ability to perform ADLs and reduce pain levels  ??  15 min Manual Therapy:????PROM with inf and post [place] mobs, STM to UT and levator   Rationale:??decrease pain, increase ROM, increase tissue extensibility, decrease trigger points and increase postural awareness????to improve the patient???s ability to??perform ADLs and reduce pain levels       With   _46  TE   _47  TA   _48  neuro   _49  other: Patient Education: _50  Review HEP    _51  Progressed/Changed HEP based on:   _52  positioning   _53   body mechanics   _0  transfers   <BHALPFXTKWIOXBDZ>_3<\/GDJMEQASTMHDQQIW>_9  heat/ice application    <NLGXQJJHERDEYCXK>_4<\/YJEHUDJSHFWYOVZC>_5  other:      Other Objective/Functional Measures:   FOTO:  62   Left Shoulder ROM:  AROM                         PROM              Flexion                         125 (UT compensation)                                            Extension                    NT                                             Abduction                    116  (UT compensation)                                        Adduction                    NT                                             IR                                 Hand to L5                                             ER                               Hand to C7          Pain Level (0-10 scale) post treatment: 2/10    ASSESSMENT/Changes in Function:   Patient shows slightly less guarding during manual. Poor scapular stability. Will continue to progress as tolerated.  Patient will continue to benefit from skilled PT services to modify and progress therapeutic interventions, address functional mobility deficits, address ROM deficits, address strength deficits, analyze and address soft tissue restrictions, analyze and cue movement patterns, analyze and modify body mechanics/ergonomics and assess and modify postural abnormalities to attain remaining goals.     _3   See Plan of Care  _4   See progress note/recertification  <YIFOYDXAJOINOMVE>_7<\/MCNOBSJGGEZMOQHU>_7   See Discharge Summary         Progress towards goals / Updated goals:  Short Term Goals: To be accomplished in 6-8 treatments:  Pt will be I with HEP met  Pt will complain of pain 2-3 with all activity met (2-3/10)  Pt will increase ROM to improve her ability to complete all basic ADL's progressing towards  Pt will be able to tolerate full treatment session to include manual met  ??  Long Term Goals: To be accomplished in 16-18 treatments:               Pt will complain of pain 0-1 with all activity progressing towards (2-3/10)  Pt will have full AROM to complete all ADL's and household chores without increased symptoms progressing towards  Pt will increase FOTO score by 5 points to meet [place] and improve their function met  Pt will be able to complete all work tasks without increased symptoms progressing towards  ??    PLAN  _0   Upgrade activities as tolerated     _1   Continue plan of care  _2   Update interventions per flow sheet       _3   Discharge due to:_  _4   Other:_      Hanley Ben 05/30/2018

## 2018-05-30 NOTE — Progress Notes (Signed)
 PT DAILY TREATMENT NOTE 2-15    Patient Name: Sara Cooke  Date:05/30/2018  DOB: 31-Jul-1970  [x]   Patient DOB Verified  Payor: [employer] [place]  / Plan: [employer] / Product Type: Commerical /    In time:743  Out time:850  Total Treatment Time (min): 67  Visit #:  8    Treatment Area: Pain in left shoulder [M25.512]    SUBJECTIVE  Pain Level (0-10 scale): 3/10  Any medication changes, allergies to medications, adverse drug reactions, diagnosis change, or new procedure performed?: [x]  No    []  Yes (see summary sheet for update)  Subjective functional status/changes:   []  No changes reported  Patient believes she is 75% better.  Patient was able to lift her arms above her head at church.     OBJECTIVE    Modality rationale: decrease inflammation and decrease pain to improve the patient's ability to perform ADLs and reduce pain levels    Min Type Additional Details       [] ? Estim: [] ?Att   [] ?Unatt    [] ?TENS instruct                  [] ?IFC  [] ?Premod   [] ?NMES                     [] ?Other:  [] ?w/US    [] ?w/ice   [] ?w/heat  Position:  Location:       [] ?  Traction: [] ? Cervical       [] ?Lumbar                       [] ? Prone          [] ?Supine                       [] ?Intermittent   [] ?Continuous Lbs:  [] ? before manual  [] ? after manual  [] ?w/heat    [] ?  Ultrasound: [] ?Continuous   [] ? Pulsed                       at: [] ?   [] ? Location:  W/cm2:    [] ? Paraffin         Location:   [] ?w/heat    [] ?  Ice     [] ?  Heat  [] ?  Ice massage Position:  Location:    [] ?  Laser  [] ?  Other: Position:  Location:     15 [x] ?  Vasopneumatic Device Pressure:       [] ? lo [x] ? med [] ? hi   Temperature: 34     [x] ? Skin assessment post-treatment:  [x] ?intact [] ?redness- no adverse reaction    [] ?redness - adverse reaction:         37 min Therapeutic Exercise:  [x] ? See flow sheet :   Rationale: increase ROM and increase strength to improve the patient's ability to perform  ADLs and reduce pain levels    15 min Manual Therapy:PROM with inf and post [place] mobs, STM to UT and levator   Rationale:decrease pain, increase ROM, increase tissue extensibility, decrease trigger points and increase postural awarenessto improve the patient's ability toperform ADLs and reduce pain levels       With   []  TE   []  TA   []  neuro   []  other: Patient Education: [x]  Review HEP    []  Progressed/Changed HEP based on:   []  positioning   []   body mechanics   []  transfers   []  heat/ice application    []  other:      Other Objective/Functional Measures:   FOTO:  62   Left Shoulder ROM:  AROM                         PROM              Flexion                         125 (UT compensation)                                            Extension                    NT                                             Abduction                    116  (UT compensation)                                        Adduction                    NT                                             IR                                 Hand to L5                                             ER                               Hand to C7          Pain Level (0-10 scale) post treatment: 2/10    ASSESSMENT/Changes in Function:   Patient shows slightly less guarding during manual. Poor scapular stability. Will continue to progress as tolerated.  Patient will continue to benefit from skilled PT services to modify and progress therapeutic interventions, address functional mobility deficits, address ROM deficits, address strength deficits, analyze and address soft tissue restrictions, analyze and cue movement patterns, analyze and modify body mechanics/ergonomics and assess and modify postural abnormalities to attain remaining goals.     []   See Plan of Care  [x]   See progress note/recertification  []   See Discharge Summary         Progress towards goals / Updated goals:  Short Term Goals: To be accomplished in 6-8 treatments:  Pt will be I  with HEP met  Pt will complain of pain 2-3 with all activity met (2-3/10)  Pt will increase ROM to improve her ability to complete all basic ADL's progressing towards  Pt will be able to tolerate full treatment session to include manual met    Long Term Goals: To be accomplished in 16-18 treatments:               Pt will complain of pain 0-1 with all activity progressing towards (2-3/10)  Pt will have full AROM to complete all ADL's and household chores without increased symptoms progressing towards  Pt will increase FOTO score by 5 points to meet [place] and improve their function met  Pt will be able to complete all work tasks without increased symptoms progressing towards      PLAN  [x]   Upgrade activities as tolerated     [x]   Continue plan of care  [x]   Update interventions per flow sheet       []   Discharge due to:_  []   Other:_      Morene T Isom 05/30/2018

## 2018-05-30 NOTE — Telephone Encounter (Signed)
Faxed

## 2018-06-01 ENCOUNTER — Inpatient Hospital Stay
Admit: 2018-06-01 | Payer: PRIVATE HEALTH INSURANCE | Attending: Rehabilitative and Restorative Service Providers" | Primary: Family Medicine

## 2018-06-01 ENCOUNTER — Telehealth: Payer: Self-pay | Admitting: Physician Assistant

## 2018-06-01 NOTE — Progress Notes (Signed)
 PT DAILY TREATMENT NOTE 2-15    Patient Name: Sara Cooke  Date:06/01/18  DOB: 1971/01/09  [x]   Patient DOB Verified  Payor: [employer] [place]  / Plan: [employer] / Product Type: Commerical /    In time:208  Out time:320  Total Treatment Time (min): 72  Visit #:  9    Treatment Area: Pain in left shoulder [M25.512]    SUBJECTIVE  Pain Level (0-10 scale): 3/10  Any medication changes, allergies to medications, adverse drug reactions, diagnosis change, or new procedure performed?: [x]  No    []  Yes (see summary sheet for update)  Subjective functional status/changes:   []  No changes reported  Patient believes she is 75% better.  Patient was able to lift her arms above her head at church.     OBJECTIVE           Modality rationale: decrease inflammation and decrease pain to improve the patient's ability to perform ADLs and reduce pain levels    Min Type Additional Details       [] ? Estim: [] ?Att   [] ?Unatt    [] ?TENS instruct                  [] ?IFC  [] ?Premod   [] ?NMES                     [] ?Other:  [] ?w/US    [] ?w/ice   [] ?w/heat  Position:  Location:       [] ?  Traction: [] ? Cervical       [] ?Lumbar                       [] ? Prone          [] ?Supine                       [] ?Intermittent   [] ?Continuous Lbs:  [] ? before manual  [] ? after manual  [] ?w/heat    [] ?  Ultrasound: [] ?Continuous   [] ? Pulsed                       at: [] ?   [] ? Location:  W/cm2:    [] ? Paraffin         Location:   [] ?w/heat   10 [] ?  Ice     [] ?  Heat  [] ?  Ice massage Position:long sit  Location:left shoulder      [] ?  Laser  [] ?  Other: Position:  Location:      [x] ?  Vasopneumatic Device Pressure:       [] ? lo [] ? med [] ? hi   Temperature:      [x] ? Skin assessment post-treatment:  [x] ?intact [] ?redness- no adverse reaction    [] ?redness - adverse reaction:         32 min Therapeutic Exercise:  [x] ? See flow sheet :   Rationale: increase ROM and increase strength to improve the  patient's ability to perform ADLs and reduce pain levels    30 min Manual Therapy:PROM with inf and post [place] mobs, STM to UT and levator   Rationale:decrease pain, increase ROM, increase tissue extensibility, decrease trigger points and increase postural awarenessto improve the patient's ability toperform ADLs and reduce pain levels       With   []  TE   []  TA   []  neuro   []  other: Patient Education: [x]  Review HEP    []   Progressed/Changed HEP based on:   []  positioning   []  body mechanics   []  transfers   []  heat/ice application    []  other:      Other Objective/Functional Measures:   Pain Level (0-10 scale) post treatment: 2/10    ASSESSMENT/Changes in Function:   Pt is progressing.  Tolerating greater ranges of PROM.  She has initiated some strengthening exercises as well.  Needs continuous cues to perform there-ex properly and with correct form and posture.  Will continue to progress as able  Patient will continue to benefit from skilled PT services to modify and progress therapeutic interventions, address functional mobility deficits, address ROM deficits, address strength deficits, analyze and address soft tissue restrictions, analyze and cue movement patterns, analyze and modify body mechanics/ergonomics and assess and modify postural abnormalities to attain remaining goals.     []   See Plan of Care  [x]   See progress note/recertification  []   See Discharge Summary         Progress towards goals / Updated goals:  Short Term Goals: To be accomplished in 6-8 treatments:               Pt will be I with HEP met  Pt will complain of pain 2-3 with all activity met (2-3/10)  Pt will increase ROM to improve her ability to complete all basic ADL's progressing towards  Pt will be able to tolerate full treatment session to include manual met    Long Term Goals: To be accomplished in 16-18 treatments:               Pt will complain of pain 0-1 with all activity progressing towards (2-3/10)  Pt will have full AROM  to complete all ADL's and household chores without increased symptoms progressing towards  Pt will increase FOTO score by 5 points to meet MDC and improve their function met  Pt will be able to complete all work tasks without increased symptoms progressing towards      PLAN  [x]   Upgrade activities as tolerated     [x]   Continue plan of care  [x]   Update interventions per flow sheet       []   Discharge due to:_  []   Other:_      Alm [name], PT 06/01/18

## 2018-06-01 NOTE — Progress Notes (Signed)
PT DAILY TREATMENT NOTE 2-15    Patient Name: Sara Cooke  Date:06/01/18  DOB: 02/07/1971  [x]   Patient DOB Verified  Payor: [place] / Plan: [employer] [place] / Product Type: Commerical /    In time:208  Out time:320  Total Treatment Time (min): 72  Visit #:  9    Treatment Area: Pain in left shoulder [M25.512]    SUBJECTIVE  Pain Level (0-10 scale): 3/10  Any medication changes, allergies to medications, adverse drug reactions, diagnosis change, or new procedure performed?: [x]  No    []  Yes (see summary sheet for update)  Subjective functional status/changes:   []  No changes reported  Patient believes she is 75% better.  Patient was able to lift her arms above her head at church.     OBJECTIVE           Modality rationale: decrease inflammation and decrease pain to improve the patient???s ability to perform ADLs and reduce pain levels    Min Type Additional Details      ?? [] ? Estim: [] ?Att   [] ?Unatt    [] ?TENS instruct                  [] ?IFC  [] ?Premod   [] ?NMES                     [] ?Other:  [] ?w/US   [] ?w/ice   [] ?w/heat  Position:  Location:      ?? [] ?  Traction: [] ? Cervical       [] ?Lumbar                       [] ? Prone          [] ?Supine                       [] ?Intermittent   [] ?Continuous Lbs:  [] ? before manual  [] ? after manual  [] ?w/heat   ?? [] ?  Ultrasound: [] ?Continuous   [] ? Pulsed                       at: [] ?1MHz   [] ?3MHz Location:  W/cm2:   ?? [] ? Paraffin         Location:   [] ?w/heat   10 [] ?  Ice     [] ?  Heat  [] ?  Ice massage Position:long sit  Location:left shoulder     ?? [] ?  Laser  [] ?  Other: Position:  Location:   ??   [x] ?  Vasopneumatic Device Pressure:       [] ? lo [] ? med [] ? hi   Temperature:    ??  [x] ? Skin assessment post-treatment:  [x] ?intact [] ?redness- no adverse reaction    [] ?redness ??? adverse reaction:   ??  ??  ??  32 min Therapeutic Exercise:  [x] ? See flow sheet :    Rationale: increase ROM and increase strength to improve the patient???s ability to perform ADLs and reduce pain levels  ??  30 min Manual Therapy:????PROM with inf and post [place] mobs, STM to UT and levator   Rationale:??decrease pain, increase ROM, increase tissue extensibility, decrease trigger points and increase postural awareness????to improve the patient???s ability to??perform ADLs and reduce pain levels       With   []  TE   []  TA   []  neuro   []  other: Patient Education: [x]  Review HEP    []   Progressed/Changed HEP based on:   []  positioning   []  body mechanics   []  transfers   []  heat/ice application    []  other:      Other Objective/Functional Measures:   Pain Level (0-10 scale) post treatment: 2/10    ASSESSMENT/Changes in Function:   Pt is progressing.  Tolerating greater ranges of PROM.  She has initiated some strengthening exercises as well.  Needs continuous cues to perform there-ex properly and with correct form and posture.  Will continue to progress as able  Patient will continue to benefit from skilled PT services to modify and progress therapeutic interventions, address functional mobility deficits, address ROM deficits, address strength deficits, analyze and address soft tissue restrictions, analyze and cue movement patterns, analyze and modify body mechanics/ergonomics and assess and modify postural abnormalities to attain remaining goals.     []   See Plan of Care  [x]   See progress note/recertification  []   See Discharge Summary         Progress towards goals / Updated goals:  Short Term Goals: To be accomplished in 6-8 treatments:               Pt will be I with HEP met  Pt will complain of pain 2-3 with all activity met (2-3/10)  Pt will increase ROM to improve her ability to complete all basic ADL's progressing towards  Pt will be able to tolerate full treatment session to include manual met  ??  Long Term Goals: To be accomplished in 16-18 treatments:                Pt will complain of pain 0-1 with all activity progressing towards (2-3/10)  Pt will have full AROM to complete all ADL's and household chores without increased symptoms progressing towards  Pt will increase FOTO score by 5 points to meet [place] and improve their function met  Pt will be able to complete all work tasks without increased symptoms progressing towards  ??    PLAN  [x]   Upgrade activities as tolerated     [x]   Continue plan of care  [x]   Update interventions per flow sheet       []   Discharge due to:_  []   Other:_      Furney Singer, PT 06/01/18

## 2018-06-01 NOTE — Progress Notes (Signed)
[facility]  [address] ([address]), [place]  [place], [place]  Phone: [phone] Fax: [phone]    Progress Note    Name: Sara Cooke   DOB: 1970-06-03   MD: Tamala Julian, Julious(Jody) P,*       Treatment Diagnosis: Pain in left shoulder [M25.512]  Start of Care: 04/27/18    Visits from Start of Care: 9  Missed Visits: 0    Summary of Care:Pt has completed 9 visits of therapy since eval that have focused on restoring ROM and strength to the left shoulder.  She reports 75% improvement and demonstrates improved ROM.  Still has 3-4/10 pain with end ranges abduction and more significantly ER.  These two ranges are her greatest limitations at this time.  She has improved in these areas as well but not to continues to have most restrictions here.  Have tried to progress there-ex and provide some additional exercises to improve ROM and initiate strength gains.  Pt requires continued verbal cues on form and posture.  Current objective findings as follows:  Left??Shoulder ROM: ??AROM????????????????????????????????????????????????  ????????????????????????Flexion??????????????????????????????????????????????????125 (UT compensation)????????????????????????????????????????????????????????????  ????????????????????????Extension????????????????????????????????????????NT??????????????????????????????????????????????????????????????  ????????????????????????Abduction????????????????????????????????????????116 ??(UT compensation)????????????????????????????????????????????????????  ????????????????????????Adduction????????????????????????????????????????NT??????????????????????????????????????????????????????????????  ????????????????????????IR??????????????????????????????????????????????????????????????????Hand to L5 ????????????????????????????????????????????????????????????  ????????????????????????ER??????????????????????????????????????????????????????????????Hand to C7????????????  Pt continues to be a good candidate for PT to further address deficits and work toward established goals  Assessment / Recommendations:     Goal:  Short Term Goals:??To be accomplished in 6-8??treatments:  ??????????????????????????Pt will be I with HEP met  Pt will complain of pain??2-3??with all activity met (2-3/10)  Pt will increase ROM to??improve her ability to complete all basic ADL's progressing towards   Pt will be able to tolerate full treatment session to include manual??met  ??  Long Term Goals:??To be accomplished in 16-18??treatments:  ??????????????????????????Pt will complain of pain??0-1??with all activity progressing towards (2-3/10)  Pt will??have full AROM to complete all ADL's and household chores without increased symptoms progressing towards  Pt will increase FOTO score by??5??points to meet MDC and improve their function met  Pt will be able to complete all work tasks without increased symptoms progressing towards  Other: COnt 2 x wk x 12-14 treatments      Minks Singer, PT 06/03/2018     ________________________________________________________________________  NOTE TO PHYSICIAN:  Please complete the following and fax to:  [facility]: [phone]  . Retain this original for your records.  If you are unable to process this request in 24 hours, please contact our office.       ____ I have read the above report and request that my patient continue therapy with the following changes/special instructions:  ____ I have read the above report and request that my patient be discharged from therapy    Physician's Signature:_________________ Date:___________Time:__________

## 2018-06-01 NOTE — Progress Notes (Signed)
 [facility]  [address] ([address]  [place], [place]  [address]  Phone: [phone] Fax: [phone]    Progress Note    Name: Sara Cooke   DOB: [birth year]   MD: Claudene, Julious(Jody) P,*       Treatment Diagnosis: Pain in left shoulder [M25.512]  Start of Care: 04/27/18    Visits from Start of Care: 9  Missed Visits: 0    Summary of Care:Pt has completed 9 visits of therapy since eval that have focused on restoring ROM and strength to the left shoulder.  She reports 75% improvement and demonstrates improved ROM.  Still has 3-4/10 pain with end ranges abduction and more significantly ER.  These two ranges are her greatest limitations at this time.  She has improved in these areas as well but not to continues to have most restrictions here.  Have tried to progress there-ex and provide some additional exercises to improve ROM and initiate strength gains.  Pt requires continued verbal cues on form and posture.  Current objective findings as follows:  LeftShoulder ROM: AROM  Flexion125 (UT compensation)  ExtensionNT  Abduction116 (UT compensation)  AdductionNT  IRHand to L5   ERHand to C7  Pt continues to be a good candidate for PT to further address deficits and work toward established goals  Assessment / Recommendations:     Goal:  Short Term Goals:To be accomplished in 6-8treatments:  Pt will be I with HEP met  Pt will complain of pain2-3with all activity met (2-3/10)  Pt will increase ROM toimprove her ability to complete all basic ADL's progressing towards  Pt  will be able to tolerate full treatment session to include manualmet    Long Term Goals:To be accomplished in 16-18treatments:  Pt will complain of pain0-1with all activity progressing towards (2-3/10)  Pt willhave full AROM to complete all ADL's and household chores without increased symptoms progressing towards  Pt will increase FOTO score by5points to meet MDC and improve their function met  Pt will be able to complete all work tasks without increased symptoms progressing towards  Other: COnt 2 x wk x 12-14 treatments      Alm [name], PT 06/03/2018     ________________________________________________________________________  NOTE TO PHYSICIAN:  Please complete the following and fax to:  [facility]: [phone]  . Retain this original for your records.  If you are unable to process this request in 24 hours, please contact our office.       ____ I have read the above report and request that my patient continue therapy with the following changes/special instructions:  ____ I have read the above report and request that my patient be discharged from therapy    Physician's Signature:_________________ Date:___________Time:__________

## 2018-06-06 ENCOUNTER — Inpatient Hospital Stay: Admit: 2018-06-06 | Payer: PRIVATE HEALTH INSURANCE | Primary: Family Medicine

## 2018-06-06 NOTE — Progress Notes (Signed)
PT DAILY TREATMENT NOTE 2-15    Patient Name: Sara Cooke  Date:06/06/2018  DOB: 1970/07/16  [x]   Patient DOB Verified  Payor: [employer] [place] / Plan: [employer] / Product Type: Commerical /    In time:733  Out time:840  Total Treatment Time (min): 77  Visit #:  10    Treatment Area: Pain in left shoulder [M25.512]    SUBJECTIVE  Pain Level (0-10 scale): 3/10  Any medication changes, allergies to medications, adverse drug reactions, diagnosis change, or new procedure performed?: [x]  No    []  Yes (see summary sheet for update)  Subjective functional status/changes:   []  No changes reported  Patient reports [place] and was unable to do her HEP.    OBJECTIVE         Modality rationale: decrease inflammation and decrease pain??to improve the patient???s ability to perform ADLs and reduce pain levels ??   Min Type Additional Details   ??  ?? [] ????Estim: [] ??Att ????[] ??Unatt ??????[] ??TENS instruct  ????????????????????????????????[] ??IFC ??[] ??Premod ????[] ??NMES ??  ??????????????????????????????????[] ??Other:  [] ??w/US ????[] ??w/ice ????[] ??w/heat  Position:  Location:   ??  ?? [] ??????Traction: [] ????Cervical ????????????[] ??Lumbar  ??????????????????????????????????????????[] ????Prone ??????????????????[] ??Supine  ??????????????????????????????????????????[] ??Intermittent ????[] ??Continuous Lbs:  [] ????before manual  [] ????after manual  [] ??w/heat   ?? [] ??????Ultrasound: [] ??Continuous ????[] ????Pulsed??  ????????????????????????????????????????at:??[] ?? ????[] ?? Location:  W/cm2:   ?? [] ????Paraffin   ?? Location:   [] ??w/heat   ?? [] ??????Ice ????????[] ??????Heat  [] ??????Ice massage Position:  Location:   ?? [] ??????Laser  [] ??????Other: Position:  Location:   ??  15 [x] ??????Vasopneumatic Device Pressure: ????????????[] ????lo [x] ????med [] ????hi   Temperature:??34   ??  [x] ????Skin assessment post-treatment: ??[x] ??intact [] ??redness- no adverse reaction  ????[] ??redness ??? adverse reaction:??  ??  ??  ??  47 min Therapeutic Exercise: ??[x] ????See flow sheet :    Rationale:??increase ROM and increase strength??to improve the patient???s ability to perform ADLs and reduce pain levels  ??  15 min Manual Therapy:????PROM with inf and post GH mobs, STM to UT and levator   Rationale:??decrease pain, increase ROM, increase tissue extensibility, decrease trigger points and increase postural awareness????to improve the patient???s ability to??perform ADLs and reduce pain levels                             With   []  TE   []  TA   []  neuro   []  other: Patient Education: [x]  Review HEP    []  Progressed/Changed HEP based on:   []  positioning   []  body mechanics   []  transfers   []  heat/ice application    []  other:      Other Objective/Functional Measures: none noted     Pain Level (0-10 scale) post treatment: 2/10    ASSESSMENT/Changes in Function:   Patient demonstrates a great improvement in P/AROM. slight discomfort with door way ER stretch. Will continue to progress as tolerated.  Patient will continue to benefit from skilled PT services to modify and progress therapeutic interventions, address functional mobility deficits, address ROM deficits, address strength deficits, analyze and address soft tissue restrictions, analyze and cue movement patterns, analyze and modify body mechanics/ergonomics and assess and modify postural abnormalities to attain remaining goals.     [x]   See Plan of Care  []   See progress note/recertification  []   See Discharge Summary         Progress towards goals / Updated goals:  Patient is progressing towards goals.  PLAN  [x]   Upgrade activities as tolerated     [x]   Continue plan of care  [x]   Update interventions per flow sheet       []   Discharge due to:_  []   Other:_      Sharlet Salina T Amel Kitch 06/06/2018

## 2018-06-06 NOTE — Progress Notes (Signed)
PT DAILY TREATMENT NOTE 2-15    Patient Name: Sara Cooke  Date:06/06/2018  DOB: 08/27/1970  [x]   Patient DOB Verified  Payor: [employer] [place] / Plan: [employer] / Product Type: Commerical /    In time:733  Out time:840  Total Treatment Time (min): 77  Visit #:  10    Treatment Area: Pain in left shoulder [M25.512]    SUBJECTIVE  Pain Level (0-10 scale): 3/10  Any medication changes, allergies to medications, adverse drug reactions, diagnosis change, or new procedure performed?: [x]  No    []  Yes (see summary sheet for update)  Subjective functional status/changes:   []  No changes reported  Patient reports she [place] and was unable to do her HEP.    OBJECTIVE         Modality rationale: decrease inflammation and decrease painto improve the patient's ability to perform ADLs and reduce pain levels    Min Type Additional Details      [] ??Estim: [] ??Att [] ??Unatt [] ??TENS instruct  [] ??IFC [] ??Premod [] ??NMES   [] ??Other:  [] ??w/US [] ??w/ice [] ??w/heat  Position:  Location:      [] ??Traction: [] ??Cervical [] ??Lumbar  [] ??Prone [] ??Supine  [] ??Intermittent [] ??Continuous Lbs:  [] ??before manual  [] ??after manual  [] ??w/heat    [] ??Ultrasound: [] ??Continuous [] ??Pulsed  at:[] ?? [] ?? Location:  W/cm2:    [] ??Paraffin    Location:   [] ??w/heat    [] ??Ice [] ??Heat  [] ??Ice massage Position:  Location:    [] ??Laser  [] ??Other: Position:  Location:     15 [x] ??Vasopneumatic Device Pressure: [] ??lo [x] ??med [] ??hi   Temperature:34     [x] ??Skin assessment post-treatment: [x] ??intact [] ??redness- no adverse reaction  [] ??redness - adverse reaction:        47 min Therapeutic Exercise: [x] ??See flow sheet :   Rationale:increase ROM and increase strengthto improve the  patient's ability to perform ADLs and reduce pain levels    15 min Manual Therapy:PROM with inf and post GH mobs, STM to UT and levator   Rationale:decrease pain, increase ROM, increase tissue extensibility, decrease trigger points and increase postural awarenessto improve the patient's ability toperform ADLs and reduce pain levels                             With   []  TE   []  TA   []  neuro   []  other: Patient Education: [x]  Review HEP    []  Progressed/Changed HEP based on:   []  positioning   []  body mechanics   []  transfers   []  heat/ice application    []  other:      Other Objective/Functional Measures: none noted     Pain Level (0-10 scale) post treatment: 2/10    ASSESSMENT/Changes in Function:   Patient demonstrates a great improvement in P/AROM. slight discomfort with door way ER stretch. Will continue to progress as tolerated.  Patient will continue to benefit from skilled PT services to modify and progress therapeutic interventions, address functional mobility deficits, address ROM deficits, address strength deficits, analyze and address soft tissue restrictions, analyze and cue movement patterns, analyze and modify body mechanics/ergonomics and assess and modify postural abnormalities to attain remaining goals.     [x]   See Plan of Care  []   See progress note/recertification  []   See Discharge Summary         Progress towards goals / Updated goals:  Patient is progressing towards goals.  PLAN  [x]   Upgrade activities as tolerated     [x]   Continue plan of care  [x]   Update interventions per flow sheet       []   Discharge due to:_  []   Other:_      [name] 06/06/2018

## 2018-06-07 DIAGNOSIS — G4733 Obstructive sleep apnea (adult) (pediatric): Secondary | ICD-10-CM | POA: Diagnosis not present

## 2018-06-07 DIAGNOSIS — M1711 Unilateral primary osteoarthritis, right knee: Secondary | ICD-10-CM | POA: Diagnosis not present

## 2018-06-08 ENCOUNTER — Inpatient Hospital Stay
Admit: 2018-06-08 | Payer: PRIVATE HEALTH INSURANCE | Attending: Rehabilitative and Restorative Service Providers" | Primary: Family Medicine

## 2018-06-08 NOTE — Progress Notes (Signed)
 PT DAILY TREATMENT NOTE 2-15    Patient Name: Sara Cooke  Date:06/08/2018  DOB: 07-09-1970  [x]   Patient DOB Verified  Payor: [employer] [place]  / Plan: [employer] / Product Type: Commerical /    In time:330  Out time:428  Total Treatment Time (min): 58  Visit #:  11    Treatment Area: Pain in left shoulder [M25.512]    SUBJECTIVE  Pain Level (0-10 scale): 3/10  Any medication changes, allergies to medications, adverse drug reactions, diagnosis change, or new procedure performed?: [x]  No    []  Yes (see summary sheet for update)  Subjective functional status/changes:   [x]  No changes reported    OBJECTIVE         Modality rationale: decrease inflammation and decrease painto improve the patient's ability to perform ADLs and reduce pain levels    Min Type Additional Details      [] ??Estim: [] ??Att [] ??Unatt [] ??TENS instruct  [] ??IFC [] ??Premod [] ??NMES   [] ??Other:  [] ??w/US  [] ??w/ice [] ??w/heat  Position:  Location:      [] ??Traction: [] ??Cervical [] ??Lumbar  [] ??Prone [] ??Supine  [] ??Intermittent [] ??Continuous Lbs:  [] ??before manual  [] ??after manual  [] ??w/heat    [] ??Ultrasound: [] ??Continuous [] ??Pulsed  at:[] ?? [] ?? Location:  W/cm2:    [] ??Paraffin    Location:   [] ??w/heat    [] ??Ice [] ??Heat  [] ??Ice massage Position:  Location:    [] ??Laser  [] ??Other: Position:  Location:     15 [x] ??Vasopneumatic Device Pressure: [] ??lo [x] ??med [] ??hi   Temperature:34     [x] ??Skin assessment post-treatment: [x] ??intact [x] ??redness- no adverse reaction  [] ??redness - adverse reaction:        31 min Therapeutic Exercise: [x] ??See flow sheet :   Rationale:increase ROM and increase strengthto improve the patient's ability to perform ADLs and reduce pain levels    12 min Manual  Therapy:PROM with inf and post GH mobs, STM to UT and levator   Rationale:decrease pain, increase ROM, increase tissue extensibility, decrease trigger points and increase postural awarenessto improve the patient's ability toperform ADLs and reduce pain levels                             With   []  TE   []  TA   []  neuro   []  other: Patient Education: [x]  Review HEP    []  Progressed/Changed HEP based on:   []  positioning   []  body mechanics   []  transfers   []  heat/ice application    []  other:      Other Objective/Functional Measures: none noted     Pain Level (0-10 scale) post treatment: 1/10    ASSESSMENT/Changes in Function:   Pt is progressing and tolerating all activity.  She needs to continue to push her ROM and complete her HEP.  She has greatest limits in ER and complains of popping in the shoulder.  Intermittently during manual she ask to pop her own shoulder because she states it gets caught.  Once she pops it she is able to continue with treatment  Patient will continue to benefit from skilled PT services to modify and progress therapeutic interventions, address functional mobility deficits, address ROM deficits, address strength deficits, analyze and address soft tissue restrictions, analyze and cue movement patterns, analyze and modify body mechanics/ergonomics and assess and modify postural abnormalities to attain remaining goals.     [x]   See Plan of Care  []   See progress note/recertification  []   See Discharge Summary         Progress towards goals / Updated goals:  Patient is progressing towards goals.     PLAN  [x]   Upgrade activities as tolerated     [x]   Continue plan of care  [x]   Update interventions per flow sheet       []   Discharge due to:_  []   Other:_      Alm [name], PT 06/08/2018

## 2018-06-08 NOTE — Progress Notes (Signed)
PT DAILY TREATMENT NOTE 2-15    Patient Name: Sara Cooke  Date:06/08/2018  DOB: September 11, 1970  [x]   Patient DOB Verified  Payor: [employer] [place] / Plan: [employer] / Product Type: Commerical /    In time:330  Out time:428  Total Treatment Time (min): 58  Visit #:  11    Treatment Area: Pain in left shoulder [M25.512]    SUBJECTIVE  Pain Level (0-10 scale): 3/10  Any medication changes, allergies to medications, adverse drug reactions, diagnosis change, or new procedure performed?: [x]  No    []  Yes (see summary sheet for update)  Subjective functional status/changes:   [x]  No changes reported    OBJECTIVE         Modality rationale: decrease inflammation and decrease pain??to improve the patient???s ability to perform ADLs and reduce pain levels ??   Min Type Additional Details   ??  ?? [] ????Estim: [] ??Att ????[] ??Unatt ??????[] ??TENS instruct  ????????????????????????????????[] ??IFC ??[] ??Premod ????[] ??NMES ??  ??????????????????????????????????[] ??Other:  [] ??w/US ????[] ??w/ice ????[] ??w/heat  Position:  Location:   ??  ?? [] ??????Traction: [] ????Cervical ????????????[] ??Lumbar  ??????????????????????????????????????????[] ????Prone ??????????????????[] ??Supine  ??????????????????????????????????????????[] ??Intermittent ????[] ??Continuous Lbs:  [] ????before manual  [] ????after manual  [] ??w/heat   ?? [] ??????Ultrasound: [] ??Continuous ????[] ????Pulsed??  ????????????????????????????????????????at:??[] ?? ????[] ?? Location:  W/cm2:   ?? [] ????Paraffin   ?? Location:   [] ??w/heat   ?? [] ??????Ice ????????[] ??????Heat  [] ??????Ice massage Position:  Location:   ?? [] ??????Laser  [] ??????Other: Position:  Location:   ??  15 [x] ??????Vasopneumatic Device Pressure: ????????????[] ????lo [x] ????med [] ????hi   Temperature:??34   ??  [x] ????Skin assessment post-treatment: ??[x] ??intact [x] ??redness- no adverse reaction  ????[] ??redness ??? adverse reaction:??  ??  ??  ??  31 min Therapeutic Exercise: ??[x] ????See flow sheet :   Rationale:??increase ROM and increase strength??to improve the patient???s ability to perform ADLs and reduce pain levels  ??   12 min Manual Therapy:????PROM with inf and post GH mobs, STM to UT and levator   Rationale:??decrease pain, increase ROM, increase tissue extensibility, decrease trigger points and increase postural awareness????to improve the patient???s ability to??perform ADLs and reduce pain levels                             With   []  TE   []  TA   []  neuro   []  other: Patient Education: [x]  Review HEP    []  Progressed/Changed HEP based on:   []  positioning   []  body mechanics   []  transfers   []  heat/ice application    []  other:      Other Objective/Functional Measures: none noted     Pain Level (0-10 scale) post treatment: 1/10    ASSESSMENT/Changes in Function:   Pt is progressing and tolerating all activity.  She needs to continue to push her ROM and complete her HEP.  She has greatest limits in ER and complains of popping in the shoulder.  Intermittently during manual she ask to pop her own shoulder because she states it gets caught.  Once she "pops" it she is able to continue with treatment  Patient will continue to benefit from skilled PT services to modify and progress therapeutic interventions, address functional mobility deficits, address ROM deficits, address strength deficits, analyze and address soft tissue restrictions, analyze and cue movement patterns, analyze and modify body mechanics/ergonomics and assess and modify postural abnormalities to attain remaining goals.     [x]   See Plan of Care  []   See progress note/recertification  []   See Discharge Summary         Progress towards goals / Updated goals:  Patient is progressing towards goals.     PLAN  [x]   Upgrade activities as tolerated     [x]   Continue plan of care  [x]   Update interventions per flow sheet       []   Discharge due to:_  []   Other:_      [name], PT 06/08/2018

## 2018-06-13 ENCOUNTER — Inpatient Hospital Stay: Admit: 2018-06-13 | Payer: PRIVATE HEALTH INSURANCE | Primary: Family Medicine

## 2018-06-13 NOTE — Progress Notes (Signed)
PT DAILY TREATMENT NOTE 2-15    Patient Name: Sara Cooke  Date:06/13/2018  DOB: Sep 16, 1970  [x]   Patient DOB Verified  Payor: [employer] [place] / Plan: [employer] / Product Type: Commerical /    In time:739  Out time:853  Total Treatment Time (min): 74  Visit #:  12    Treatment Area: Pain in left shoulder [M25.512]    SUBJECTIVE  Pain Level (0-10 scale): 2/10  Any medication changes, allergies to medications, adverse drug reactions, diagnosis change, or new procedure performed?: [x]  No    []  Yes (see summary sheet for update)  Subjective functional status/changes:   []  No changes reported  Patient reports she continues to feel "popping", she reports there is no pain with it but it does bother her.      OBJECTIVE     ??      Modality rationale: decrease inflammation and decrease pain??to improve the patient???s ability to perform ADLs and reduce pain levels ??   Min Type Additional Details   ??  ?? [] ?????Estim: [] ???Att ????[] ???Unatt ??????[] ???TENS instruct  ????????????????????????????????[] ???IFC ??[] ???Premod ????[] ???NMES ??  ??????????????????????????????????[] ???Other:  [] ???w/US ????[] ???w/ice ????[] ???w/heat  Position:  Location:   ??  ?? [] ???????Traction: [] ?????Cervical ????????????[] ???Lumbar  ??????????????????????????????????????????[] ?????Prone ??????????????????[] ???Supine  ??????????????????????????????????????????[] ???Intermittent ????[] ???Continuous Lbs:  [] ?????before manual  [] ?????after manual  [] ???w/heat   ?? [] ???????Ultrasound: [] ???Continuous ????[] ?????Pulsed??  ????????????????????????????????????????at:??[] ??? ????[] ??? Location:  W/cm2:   ?? [] ?????Paraffin   ?? Location:   [] ???w/heat   ?? [] ???????Ice ????????[] ???????Heat  [] ???????Ice massage Position:  Location:   ?? [] ???????Laser  [] ???????Other: Position:  Location:   ??  15 [x] ???????Vasopneumatic Device Pressure: ????????????[] ?????lo [x] ?????med [] ?????hi   Temperature:??34   ??  [x] ?????Skin assessment post-treatment: ??[x] ???intact [] ???redness- no adverse reaction  ????[] ???redness ?????adverse reaction:??  ??  ??  ??   49 min Therapeutic Exercise: ??[x] ?????See flow sheet :   Rationale:??increase ROM and increase strength??to improve the patient???s ability to perform ADLs and reduce pain levels  ??  15 min Manual Therapy:????PROM with inf and post GH mobs, STM to UT and levator   Rationale:??decrease pain, increase ROM, increase tissue extensibility, decrease trigger points and increase postural awareness????to improve the patient???s ability to??perform ADLs and reduce pain levels  ??????????????????????????????????????????????  With   []  TE   []  TA   []  neuro   []  other: Patient Education: [x]  Review HEP    []  Progressed/Changed HEP based on:   []  positioning   []  body mechanics   []  transfers   []  heat/ice application    []  other:      Other Objective/Functional Measures: none noted     Pain Level (0-10 scale) post treatment: 1/10    ASSESSMENT/Changes in Function:   Patient continues to be unable to go through full A/PROM in either abduction or ER. reiterated the importance of her HEP after patient asked if she could narrow it down to ten minutes. Will continue to show upper trap compensation patterns. Tolerated all new and progressed therex, will continue to progress as tolerated.   Patient will continue to benefit from skilled PT services to modify and progress therapeutic interventions, address functional mobility deficits, address ROM deficits, address strength deficits, analyze and address soft tissue restrictions, analyze and cue movement patterns, analyze and modify body mechanics/ergonomics and assess and modify postural abnormalities to attain remaining goals.     [x]   See Plan of Care  []   See progress note/recertification  []   See Discharge Summary  Progress towards goals / Updated goals:  Patient is progressing towards goals.     PLAN  [x]   Upgrade activities as tolerated     [x]   Continue plan of care  [x]   Update interventions per flow sheet       []   Discharge due to:_  []   Other:_      Glynis Smiles 06/13/2018

## 2018-06-13 NOTE — Progress Notes (Signed)
 PT DAILY TREATMENT NOTE 2-15    Patient Name: Sara Cooke  Date:06/13/2018  DOB: [birth year]  [x]   Patient DOB Verified  Payor: [employer] [place]  / Plan: [employer] / Product Type: Commerical /    In time:739  Out time:853  Total Treatment Time (min): 74  Visit #:  12    Treatment Area: Pain in left shoulder [M25.512]    SUBJECTIVE  Pain Level (0-10 scale): 2/10  Any medication changes, allergies to medications, adverse drug reactions, diagnosis change, or new procedure performed?: [x]  No    []  Yes (see summary sheet for update)  Subjective functional status/changes:   []  No changes reported  Patient reports she continues to feel popping, she reports there is no pain with it but it does bother her.      OBJECTIVE           Modality rationale: decrease inflammation and decrease painto improve the patient's ability to perform ADLs and reduce pain levels    Min Type Additional Details      [] ???Estim: [] ???Att [] ???Unatt [] ???TENS instruct  [] ???IFC [] ???Premod [] ???NMES   [] ???Other:  [] ???w/US  [] ???w/ice [] ???w/heat  Position:  Location:      [] ???Traction: [] ???Cervical [] ???Lumbar  [] ???Prone [] ???Supine  [] ???Intermittent [] ???Continuous Lbs:  [] ???before manual  [] ???after manual  [] ???w/heat    [] ???Ultrasound: [] ???Continuous [] ???Pulsed  at:[] ??? [] ??? Location:  W/cm2:    [] ???Paraffin    Location:   [] ???w/heat    [] ???Ice [] ???Heat  [] ???Ice massage Position:  Location:    [] ???Laser  [] ???Other: Position:  Location:     15 [x] ???Vasopneumatic Device Pressure: [] ???lo [x] ???med [] ???hi   Temperature:34     [x] ???Skin assessment post-treatment: [x] ???intact [] ???redness- no adverse reaction  [] ???redness -adverse reaction:        49 min Therapeutic Exercise: [x] ???See flow  sheet :   Rationale:increase ROM and increase strengthto improve the patient's ability to perform ADLs and reduce pain levels    15 min Manual Therapy:PROM with inf and post GH mobs, STM to UT and levator   Rationale:decrease pain, increase ROM, increase tissue extensibility, decrease trigger points and increase postural awarenessto improve the patient's ability toperform ADLs and reduce pain levels    With   []  TE   []  TA   []  neuro   []  other: Patient Education: [x]  Review HEP    []  Progressed/Changed HEP based on:   []  positioning   []  body mechanics   []  transfers   []  heat/ice application    []  other:      Other Objective/Functional Measures: none noted     Pain Level (0-10 scale) post treatment: 1/10    ASSESSMENT/Changes in Function:   Patient continues to be unable to go through full A/PROM in either abduction or ER. reiterated the importance of her HEP after patient asked if she could narrow it down to ten minutes. Will continue to show upper trap compensation patterns. Tolerated all new and progressed therex, will continue to progress as tolerated.   Patient will continue to benefit from skilled PT services to modify and progress therapeutic interventions, address functional mobility deficits, address ROM deficits, address strength deficits, analyze and address soft tissue restrictions, analyze and cue movement patterns, analyze and modify body mechanics/ergonomics and assess and modify postural abnormalities to attain remaining goals.     [x]   See Plan of Care  []   See progress note/recertification  []   See Discharge Summary  Progress towards goals / Updated goals:  Patient is progressing towards goals.     PLAN  [x]   Upgrade activities as tolerated     [x]   Continue plan of care  [x]   Update interventions per flow sheet       []   Discharge due to:_  []   Other:_      Sara Cooke Shoulder 06/13/2018

## 2018-06-14 ENCOUNTER — Other Ambulatory Visit: Payer: Self-pay | Admitting: Physician Assistant

## 2018-06-14 DIAGNOSIS — E119 Type 2 diabetes mellitus without complications: Secondary | ICD-10-CM

## 2018-06-15 ENCOUNTER — Inpatient Hospital Stay
Admit: 2018-06-15 | Payer: PRIVATE HEALTH INSURANCE | Attending: Rehabilitative and Restorative Service Providers" | Primary: Family Medicine

## 2018-06-15 ENCOUNTER — Ambulatory Visit: Payer: BLUE CROSS/BLUE SHIELD | Admitting: Neurology

## 2018-06-15 ENCOUNTER — Encounter: Payer: Self-pay | Admitting: Neurology

## 2018-06-15 VITALS — BP 142/88 | HR 88 | Ht 68.0 in | Wt 255.0 lb

## 2018-06-15 DIAGNOSIS — G4733 Obstructive sleep apnea (adult) (pediatric): Secondary | ICD-10-CM | POA: Diagnosis not present

## 2018-06-15 DIAGNOSIS — Z9989 Dependence on other enabling machines and devices: Secondary | ICD-10-CM | POA: Diagnosis not present

## 2018-06-15 NOTE — Progress Notes (Signed)
Subjective:    Patient ID: Judith [name] is a 48 y.o. female.  HPI     Interim history:   Judith [name] is a 48 year old right-handed woman with an underlying medical history of migraine headaches, mood d/o, including Bipolar d/o, asthma, hyperlipidemia, hypothyroidism, status post multiple surgeries including carpal tunnel surgery bilaterally, cesarean section, cholecystectomy, thyroidectomy, tonsillectomy, history of morbid obesity, who presents for follow-up consultation of her obstructive sleep apnea after sleep study testing and starting AutoPap therapy. The patient is unaccompanied today. I first met her on 02/22/2018 at the request of her primary care PA, at which time she reported snoring and daytime somnolence, Epworth sleepiness score was 17 at the time. She was advised to proceed with a sleep study. She had a baseline sleep study on 03/24/2018, I went over her test results with her in detail today. Sleep latency was delayed at 35.5 minutes, of note, she had a daytime sleep study. Sleep efficiency was 89.1%, REM latency markedly delayed at 293.5 minutes. Wake after sleep onset was 25.5 minutes. She had a reduced percentage of REM sleep at 13.6%. Total AHI was in the mild range at 8.7 per hour, REM AHI 46 per hour, supine AHI 12.8 per hour. Average oxygen saturation was 97%, nadir was 74%. She had no significant PLMS. She was advised to proceed with AutoPap therapy.   Today, 06/15/2018: I reviewed her AutoPap compliance data for the past 30 days from 05/15/2018 through 06/13/2018, during which time she used her AutoPap 26 days with percent used days greater than 4 hours at 67%, indicating suboptimal compliance with an average usage of 5 hours and 16 minutes, residual AHI borderline at 5.8 per hour, 95th percentile pressure at 12.7 cm leak him with the 95th percentile at 25.2 L/m on a pressure range of 7 cm to 14 cm with EPR. In the month of early December through early January she had better  compliance with 73% compliance formal 4 hours, average usage of 5 hours and 21 minutes, residual AHI again noted to be somewhat suboptimal at 7.3 per hour, 95th percentile of pressure of 13 cm, leak a little bit better with the 95th percentile at 17.2 L/m, she did also have some central events which cause the increase in AHI. She reports that she could not sleep with the autoPAP for a few days due to having dental extractions. She has had instances of feeling that the pressure is smothering her. She does not sleep very much generally, has a hard time going to sleep and staying asleep. She does feel, that her energy level is better, if she is able to tolerate and sleep with it. In fact, her ESS is better at 9/24 today. She is motivated to continue with autoPAP. She is going to [place] for her R TKA, through [facility] ortho, on 06/22/18. She plans to stay about 10 days and will go to [place] for rehab.   Previously:   02/22/2018: (She) reports snoring and excessive daytime somnolence. She has previously had a sleep study. Prior sleep study results are not available for my review today. I reviewed your office note from 02/10/2018. She was hospitalized last month secondary to sepsis with pyelonephritis and was noted to have oxygen desaturations while asleep. Her Epworth sleepiness score is 17 out of 24, fatigue score is 63 out of 63. She lives with her fianc, her son and aunt. She works for [employer]. She drinks not currently alcohol, maybe rare. She drinks caffeine in  the form of soda, 20 ounce per day on average. She quit smoking in 94. She was given oxygen at night during her hospital stay. She does have nocturia about 3-4 times per night, she has occasional morning headaches. She has a FHx of OSA in maternal aunt, and father. She is trying to lose weight.   Her Past Medical History Is Significant For: Past Medical History:  Diagnosis Date  . Asthma   . Bipolar 1 disorder ([place])   . Depression    . GERD (gastroesophageal reflux disease)   . Hyperlipemia   . Hypothyroid   . Migraines     Her Past Surgical History Is Significant For: Past Surgical History:  Procedure Laterality Date  . ABDOMINAL SURGERY    . CARPAL TUNNEL RELEASE Bilateral 1999  . CESAREAN SECTION    . CHOLECYSTECTOMY    . THYROIDECTOMY    . TONSILLECTOMY      Her Family History Is Significant For: Family History  Problem Relation Age of Onset  . Mental illness Other   . Diabetes Other     Her Social History Is Significant For: Social History   Socioeconomic History  . Marital status: Divorced    Spouse name: Not on file  . Number of children: Not on file  . Years of education: Not on file  . Highest education level: Not on file  Occupational History  . Not on file  Social Needs  . Financial resource strain: Not on file  . Food insecurity:    Worry: Not on file    Inability: Not on file  . Transportation needs:    Medical: Not on file    Non-medical: Not on file  Tobacco Use  . Smoking status: Former Research scientist (life sciences)  . Smokeless tobacco: Never Used  Substance and Sexual Activity  . Alcohol use: No  . Drug use: No  . Sexual activity: Yes    Birth control/protection: I.U.D.  Lifestyle  . Physical activity:    Days per week: Not on file    Minutes per session: Not on file  . Stress: Not on file  Relationships  . Social connections:    Talks on phone: Not on file    Gets together: Not on file    Attends religious service: Not on file    Active member of club or organization: Not on file    Attends meetings of clubs or organizations: Not on file    Relationship status: Not on file  Other Topics Concern  . Not on file  Social History Narrative  . Not on file    Her Allergies Are:  Allergies  Allergen Reactions  . Diphenhydramine Other (See Comments)    Hair feels like it is crawling  . Morphine Nausea And Vomiting    PCA PUMP N/V IV PUSH IN ER NO PROBLEM PER PT.  . Prednisone  Nausea And Vomiting  . Erythromycin Hives  . Sulfonamide Derivatives Other (See Comments)    Does not take due to family history  :   Her Current Medications Are:  Outpatient Encounter Medications as of 06/15/2018  Medication Sig  . albuterol (PROVENTIL) (2.5 MG/3ML) 0.083% nebulizer solution Nebulize 1 vial three times daily  . atorvastatin (LIPITOR) 10 MG tablet Take 1 tablet (10 mg total) by mouth daily.  . budesonide-formoterol (SYMBICORT) 160-4.5 MCG/ACT inhaler Inhale 2 puffs into the lungs 2 (two) times daily.  . busPIRone (BUSPAR) 15 MG tablet Take 15 mg by mouth 3 (three) times  daily.   . cetirizine (ZYRTEC) 10 MG tablet Take 1 tablet (10 mg total) by mouth daily. (Patient taking differently: Take 20 mg by mouth 2 (two) times daily. )  . diclofenac sodium (VOLTAREN) 1 % GEL Apply 4 g topically 4 (four) times daily.  Marland Kitchen dicyclomine (BENTYL) 20 MG tablet Take 1 tablet (20 mg total) by mouth every 6 (six) hours.  Marland Kitchen doxepin (SINEQUAN) 25 MG capsule Take 25 mg by mouth at bedtime.   Marland Kitchen doxycycline (VIBRA-TABS) 100 MG tablet Take 1 tablet (100 mg total) by mouth 2 (two) times daily.  . fluconazole (DIFLUCAN) 150 MG tablet 1 po q week x 4 weeks  . hydrOXYzine (ATARAX/VISTARIL) 25 MG tablet Take 1 tablet (25 mg total) by mouth daily. (Patient taking differently: Take 25 mg by mouth 3 (three) times daily. )  . ibuprofen (ADVIL,MOTRIN) 800 MG tablet Take 1 tablet (800 mg total) by mouth every 8 (eight) hours as needed.  . lamoTRIgine (LAMICTAL) 150 MG tablet Take 150 mg by mouth 2 (two) times daily.  Marland Kitchen levothyroxine (SYNTHROID, LEVOTHROID) 125 MCG tablet Take 1 tablet (125 mcg total) by mouth daily.  . Lisdexamfetamine Dimesylate (VYVANSE PO) Take 70 mg by mouth daily.   Marland Kitchen lisinopril (PRINIVIL,ZESTRIL) 20 MG tablet Take 20 mg by mouth daily.  . ondansetron (ZOFRAN ODT) 8 MG disintegrating tablet Take 1 tablet (8 mg total) by mouth every 8 (eight) hours as needed for nausea or vomiting.  .  pantoprazole (PROTONIX) 40 MG tablet Take 1 tablet (40 mg total) by mouth 2 (two) times daily.  . pioglitazone (ACTOS) 15 MG tablet TAKE 1 TABLET BY MOUTH DAILY  . sertraline (ZOLOFT) 100 MG tablet Take 200 mg by mouth daily.   . silver sulfADIAZINE (SILVADENE) 1 % cream Apply topically.  . traMADol (ULTRAM) 50 MG tablet Take 1 tablet (50 mg total) by mouth every 8 (eight) hours as needed for severe pain.  Marland Kitchen aspirin EC 81 MG tablet Take 162 mg by mouth daily.   . SUMAtriptan Succinate 4 MG/0.5ML SOCT Inject 4 mg into the skin once.  . [DISCONTINUED] mometasone (ELOCON) 0.1 % cream Apply 1 application topically daily. x10 days   No facility-administered encounter medications on file as of 06/15/2018.   :  Review of Systems:  Out of a complete 14 point review of systems, all are reviewed and negative with the exception of these symptoms as listed below: Review of Systems  Neurological:       Pt presents today to discuss her cpap. Pt sometimes wakes up "smothering". Some nights are better than others and she sleeps well.  Epworth Sleepiness Scale 0= would never doze 1= slight chance of dozing 2= moderate chance of dozing 3= high chance of dozing  Sitting and reading: 1 Watching TV: 1 Sitting inactive in a public place (ex. Theater or meeting): 2 As a passenger in a car for an hour without a break: 2 Lying down to rest in the afternoon: 2 Sitting and talking to someone: 0 Sitting quietly after lunch (no alcohol): 1 In a car, while stopped in traffic: 0 Total: 9     Objective:  Neurological Exam  Physical Exam Physical Examination:   Vitals:   06/15/18 0920  BP: (!) 142/88  Pulse: 88   General Examination: The patient is a very pleasant 48 y.o. female in no acute distress. She appears well-developed and well-nourished and well groomed.   HEENT: Normocephalic, atraumatic, pupils are equal, round and reactive to light  and accommodation. Extraocular tracking is good without  limitation to gaze excursion or nystagmus noted. Normal smooth pursuit is noted. Hearing is grossly intact. Face is symmetric with normal facial animation and normal facial sensation. Speech is clear with no dysarthria noted. There is no hypophonia. There is no lip, neck/head, jaw or voice tremor. Neck shows FROM. Oropharynx exam reveals: moderate mouth dryness, adequate dental hygiene with nearly edentulous state on the top, mild airway crowding noted. Tongue protrudes centrally and palate elevates symmetrically.  Chest: Clear to auscultation without wheezing, rhonchi or crackles noted.  Heart: S1+S2+0, regular and normal without murmurs, rubs or gallops noted.   Abdomen: Soft, non-tender and non-distended with normal bowel sounds appreciated on auscultation.  Extremities: There is no pitting edema in the distal lower extremities bilaterally.   Skin: Warm and dry without trophic changes noted. There are no varicose veins.  Musculoskeletal: exam reveals Mild right knee swelling.  Neurologically:  Mental status: The patient is awake, alert and oriented in all 4 spheres. Her immediate and remote memory, attention, language skills and fund of knowledge are appropriate. There is no evidence of aphasia, agnosia, apraxia or anomia. Speech is clear with normal prosody and enunciation. Thought process is linear. Mood is normal and affect is normal.  Cranial nerves II - XII are as described above under HEENT exam. In addition: shoulder shrug is normal with equal shoulder height noted. Motor exam: Normal bulk, strength and tone is noted. There is no tremor. Fine motor skills and coordination: grossly intact.  Cerebellar testing: No dysmetria or intention tremor. There is no truncal or gait ataxia.  Sensory exam: intact to light touch in the upper and lower extremities.  Gait, station and balance: She stands easily. No veering to one side is noted. No leaning to one side is noted. Posture is  age-appropriate and stance is narrow based. Gait shows normal stride length and normal pace. No problems turning are noted.   Assessment and Plan:   In summary, Judith [name] is a very pleasant 48 year old female with an underlying medical history of migraine headaches, mood d/o, including Bipolar d/o, asthma, hyperlipidemia, hypothyroidism, status post multiple surgeries including carpal tunnel surgery bilaterally, cesarean section, cholecystectomy, thyroidectomy, tonsillectomy, and obesity, who presents for follow-up consultation of her obstructive sleep apnea. She had a baseline sleep study in November 2013 which showed the total AHI to be in the mild range but she had severe REM related desaturations and events. She has established treatment with AutoPap therapy and is compliant with it. She had a lapse in treatment secondary to recent tooth extractions. She is motivated to continue and has benefited from treatment and that her sleep quality and daytime energy are better. She is advised to continue to be compliant with treatment. She has an upcoming knee replacement surgery which is going [place] be [place] of state. She is planning to take her machine with her to [place] as well as to [place] but she comes back from her surgery and is planning to do rehabilitation at her parent's house. She is advised to follow-up routinely in 6 months, sooner if needed. She can see one of our nurse practitioners next time. We talked about her study results in detail as well as her compliance data. I answered all her questions today and she was in agreement. I spent 25 minutes in total face-to-face time with the patient, more than 50% of which was spent in counseling and coordination of care, reviewing test results, reviewing medication  and discussing or reviewing the diagnosis of OSA, its prognosis and treatment options. Pertinent laboratory and imaging test results that were available during this visit with the patient were  reviewed by me and considered in my medical decision making (see chart for details).

## 2018-06-15 NOTE — Progress Notes (Signed)
PT DAILY TREATMENT NOTE 2-15    Patient Name: Sara Cooke  Date:06/15/2018  DOB: June 23, 1970  [x]   Patient DOB Verified  Payor: [employer] [place] / Plan: [employer] / Product Type: Commerical /    In time:733  Out time:343  Total Treatment Time (min): 70  Visit #:  13    Treatment Area: Pain in left shoulder [M25.512]    SUBJECTIVE  Pain Level (0-10 scale): 1/10  Any medication changes, allergies to medications, adverse drug reactions, diagnosis change, or new procedure performed?: [x]  No    []  Yes (see summary sheet for update)  Subjective functional status/changes:   []  No changes reported  Pt reports popping, continues to deny pain      OBJECTIVE     ??      Modality rationale: decrease inflammation and decrease pain??to improve the patient???s ability to perform ADLs and reduce pain levels ??   Min Type Additional Details   ??  ?? [] ?????Estim: [] ???Att ????[] ???Unatt ??????[] ???TENS instruct  ????????????????????????????????[] ???IFC ??[] ???Premod ????[] ???NMES ??  ??????????????????????????????????[] ???Other:  [] ???w/US ????[] ???w/ice ????[] ???w/heat  Position:  Location:   ??  ?? [] ???????Traction: [] ?????Cervical ????????????[] ???Lumbar  ??????????????????????????????????????????[] ?????Prone ??????????????????[] ???Supine  ??????????????????????????????????????????[] ???Intermittent ????[] ???Continuous Lbs:  [] ?????before manual  [] ?????after manual  [] ???w/heat   ?? [] ???????Ultrasound: [] ???Continuous ????[] ?????Pulsed??  ????????????????????????????????????????at:??[] ??? ????[] ??? Location:  W/cm2:   ?? [] ?????Paraffin   ?? Location:   [] ???w/heat   ?? [] ???????Ice ????????[] ???????Heat  [] ???????Ice massage Position:  Location:   ?? [] ???????Laser  [] ???????Other: Position:  Location:   ??  15 [x] ???????Vasopneumatic Device Pressure: ????????????[] ?????lo [x] ?????med [] ?????hi   Temperature:??34   ??  [x] ?????Skin assessment post-treatment: ??[x] ???intact [x] ???redness- no adverse reaction  ????[] ???redness ?????adverse reaction:??  ??  ??  ??  35 min Therapeutic Exercise: ??[x] ?????See flow sheet :    Rationale:??increase ROM and increase strength??to improve the patient???s ability to perform ADLs and reduce pain levels  ??  20 min Manual Therapy:????PROM with inf and post GH mobs, STM to UT and levator   Rationale:??decrease pain, increase ROM, increase tissue extensibility, decrease trigger points and increase postural awareness????to improve the patient???s ability to??perform ADLs and reduce pain levels  ??????????????????????????????????????????????  With   []  TE   []  TA   []  neuro   []  other: Patient Education: [x]  Review HEP    []  Progressed/Changed HEP based on:   []  positioning   []  body mechanics   []  transfers   []  heat/ice application    []  other:      Other Objective/Functional Measures: none noted     Pain Level (0-10 scale) post treatment: 1/10    ASSESSMENT/Changes in Function:   Pt is making progress with her ROM and responds well to posterior GH glides with PROM into ER.  She does have discomfort with return to neutral but was able to get a stretch in this position today  Patient will continue to benefit from skilled PT services to modify and progress therapeutic interventions, address functional mobility deficits, address ROM deficits, address strength deficits, analyze and address soft tissue restrictions, analyze and cue movement patterns, analyze and modify body mechanics/ergonomics and assess and modify postural abnormalities to attain remaining goals.     [x]   See Plan of Care  []   See progress note/recertification  []   See Discharge Summary         Progress towards goals / Updated goals:  Patient is progressing towards goals.     PLAN  [x]   Upgrade activities as tolerated     [  x]  Continue plan of care  [x]   Update interventions per flow sheet       []   Discharge due to:_  []   Other:_      [name], PT 06/15/2018

## 2018-06-15 NOTE — Progress Notes (Signed)
 PT DAILY TREATMENT NOTE 2-15    Patient Name: Sara Cooke  Date:06/15/2018  DOB: 1970/07/19  [x]   Patient DOB Verified  Payor: [employer] [place]  / Plan: [employer] / Product Type: Commerical /    In time:733  Out time:343  Total Treatment Time (min): 70  Visit #:  13    Treatment Area: Pain in left shoulder [M25.512]    SUBJECTIVE  Pain Level (0-10 scale): 1/10  Any medication changes, allergies to medications, adverse drug reactions, diagnosis change, or new procedure performed?: [x]  No    []  Yes (see summary sheet for update)  Subjective functional status/changes:   []  No changes reported  Pt reports popping, continues to deny pain      OBJECTIVE           Modality rationale: decrease inflammation and decrease painto improve the patient's ability to perform ADLs and reduce pain levels    Min Type Additional Details      [] ???Estim: [] ???Att [] ???Unatt [] ???TENS instruct  [] ???IFC [] ???Premod [] ???NMES   [] ???Other:  [] ???w/US  [] ???w/ice [] ???w/heat  Position:  Location:      [] ???Traction: [] ???Cervical [] ???Lumbar  [] ???Prone [] ???Supine  [] ???Intermittent [] ???Continuous Lbs:  [] ???before manual  [] ???after manual  [] ???w/heat    [] ???Ultrasound: [] ???Continuous [] ???Pulsed  at:[] ??? [] ??? Location:  W/cm2:    [] ???Paraffin    Location:   [] ???w/heat    [] ???Ice [] ???Heat  [] ???Ice massage Position:  Location:    [] ???Laser  [] ???Other: Position:  Location:     15 [x] ???Vasopneumatic Device Pressure: [] ???lo [x] ???med [] ???hi   Temperature:34     [x] ???Skin assessment post-treatment: [x] ???intact [x] ???redness- no adverse reaction  [] ???redness -adverse reaction:        35 min Therapeutic Exercise: [x] ???See flow sheet :   Rationale:increase ROM and increase strengthto  improve the patient's ability to perform ADLs and reduce pain levels    20 min Manual Therapy:PROM with inf and post GH mobs, STM to UT and levator   Rationale:decrease pain, increase ROM, increase tissue extensibility, decrease trigger points and increase postural awarenessto improve the patient's ability toperform ADLs and reduce pain levels    With   []  TE   []  TA   []  neuro   []  other: Patient Education: [x]  Review HEP    []  Progressed/Changed HEP based on:   []  positioning   []  body mechanics   []  transfers   []  heat/ice application    []  other:      Other Objective/Functional Measures: none noted     Pain Level (0-10 scale) post treatment: 1/10    ASSESSMENT/Changes in Function:   Pt is making progress with her ROM and responds well to posterior GH glides with PROM into ER.  She does have discomfort with return to neutral but was able to get a stretch in this position today  Patient will continue to benefit from skilled PT services to modify and progress therapeutic interventions, address functional mobility deficits, address ROM deficits, address strength deficits, analyze and address soft tissue restrictions, analyze and cue movement patterns, analyze and modify body mechanics/ergonomics and assess and modify postural abnormalities to attain remaining goals.     [x]   See Plan of Care  []   See progress note/recertification  []   See Discharge Summary         Progress towards goals / Updated goals:  Patient is progressing towards goals.     PLAN  [x]   Upgrade activities as tolerated     [  x]  Continue plan of care  [x]   Update interventions per flow sheet       []   Discharge due to:_  []   Other:_      Alm [name], PT 06/15/2018

## 2018-06-15 NOTE — Patient Instructions (Signed)
Please continue using your autoPAP regularly. While your insurance requires that you use PAP at least 4 hours each night on 70% of the nights, I recommend, that you not skip any nights and use it throughout the night if you can. Getting used to PAP and staying with the treatment long term does take time and patience and discipline. Untreated obstructive sleep apnea when it is moderate to severe can have an adverse impact on cardiovascular health and raise her risk for heart disease, arrhythmias, hypertension, congestive heart failure, stroke and diabetes. Untreated obstructive sleep apnea causes sleep disruption, nonrestorative sleep, and sleep deprivation. This can have an impact on your day to day functioning and cause daytime sleepiness and impairment of cognitive function, memory loss, mood disturbance, and problems focussing. Using PAP regularly can improve these symptoms.  Good luck with your knee surgery, please don't forget to take your autoPAP machine to [place] [facility] [place].

## 2018-06-16 NOTE — Progress Notes (Signed)
Patient on report as eligible for Case Management.  Left discreet message on voicemail with this CM contact information.  Will attempt to contact again to offer [place] [employer] Care [employer] services.

## 2018-06-16 NOTE — Progress Notes (Signed)
Patient on report as eligible for Case Management.  Left discreet message on voicemail with this CM contact information.  Will attempt to contact again to offer [place] [employer] Care [employer] services.

## 2018-06-17 ENCOUNTER — Ambulatory Visit: Admit: 2018-06-17 | Payer: PRIVATE HEALTH INSURANCE | Primary: Family Medicine

## 2018-06-17 ENCOUNTER — Ambulatory Visit: Primary: Family Medicine

## 2018-06-17 DIAGNOSIS — R1114 Bilious vomiting: Secondary | ICD-10-CM

## 2018-06-17 DIAGNOSIS — G43809 Other migraine, not intractable, without status migrainosus: Secondary | ICD-10-CM

## 2018-06-17 LAB — AMB POC URINALYSIS DIP STICK AUTO W/O MICRO
Bilirubin (UA POC): NEGATIVE
Bilirubin, Urine, POC: NEGATIVE
Blood (UA POC): NEGATIVE
Blood (UA POC): NEGATIVE
Glucose (UA POC): NEGATIVE
Glucose, Urine, POC: NEGATIVE
Leukocyte Esterase, Urine, POC: NEGATIVE
Leukocyte esterase (UA POC): NEGATIVE
Nitrite, Urine, POC: NEGATIVE
Nitrites (UA POC): NEGATIVE
Protein (UA POC): NEGATIVE
Protein, Urine, POC: NEGATIVE
Specific Gravity, Urine, POC: 1.03 NA (ref 1.001–1.035)
Specific gravity (UA POC): 1.03 (ref 1.001–1.035)
Urobilinogen (UA POC): 0.2 (ref 0.2–1)
Urobilinogen, POC: 0.2 (ref 0.2–1)
pH (UA POC): 6.5 (ref 4.6–8.0)
pH, Urine, POC: 6.5 NA (ref 4.6–8.0)

## 2018-06-17 LAB — AMB POC SOFIA INFLUENZA A/B TEST
Influenza A Ag POC: NEGATIVE Pos/Neg
Influenza A Antigen, POC: NEGATIVE Pos/Neg
Influenza B Ag POC: NEGATIVE Pos/Neg
Influenza B Antigen, POC: NEGATIVE Pos/Neg
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
## 2018-06-17 NOTE — Patient Instructions (Signed)
Results for orders placed or performed in visit on 06/17/18   AMB POC SOFIA INFLUENZA A/B TEST   Result Value Ref Range    VALID INTERNAL CONTROL POC Yes     Influenza A Ag POC Negative Negative Pos/Neg    Influenza B Ag POC Negative Negative Pos/Neg   AMB POC URINALYSIS DIP STICK AUTO W/O MICRO   Result Value Ref Range    Color (UA POC)      Clarity (UA POC)      Glucose (UA POC) Negative Negative    Bilirubin (UA POC) Negative Negative    Ketones (UA POC) 2+ Negative    Specific gravity (UA POC) 1.030 1.001 - 1.035    Blood (UA POC) Negative Negative    pH (UA POC) 6.5 4.6 - 8.0    Protein (UA POC) Negative Negative    Urobilinogen (UA POC) 0.2 mg/dL 0.2 - 1    Nitrites (UA POC) Negative Negative    Leukocyte esterase (UA POC) Negative Negative         Go to one of the following below immediately without delay:    [facility]   (EMERGENCY DEPARTMENT)  [address]   [phone]   Open 24 hours    [facility]   (EMERGENCY DEPARTMENT)  [address]   [phone]   Open 24 hours    [facility]  (EMERGENCY DEPARTMENT)  [address]  [place], [place] [address]   [phone]     [facility]   [address]  [place], [place] [address]   [phone]
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
## 2018-06-17 NOTE — ED Triage Notes (Cosign Needed)
Pt reports she woke up this morning with migraine and began vomiting (pt reports it is yellow "like bile").

## 2018-06-17 NOTE — ED Notes (Signed)
The patient left the Emergency Department ambulatory, alert and oriented and in no acute distress. The patient was encouraged to call or return to the ED for worsening issues or problems and was encouraged to schedule a follow up appointment for continuing care.   ??  The patient verbalized understanding of discharge instructions and prescriptions, all questions were answered. The patient has no further concerns at this time.
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
## 2018-06-17 NOTE — ED Notes (Signed)
 The patient left the Emergency Department ambulatory, alert and oriented and in no acute distress. The patient was encouraged to call or return to the ED for worsening issues or problems and was encouraged to schedule a follow up appointment for continuing care.      The patient verbalized understanding of discharge instructions and prescriptions, all questions were answered. The patient has no further concerns at this time.

## 2018-06-17 NOTE — ED Notes (Deleted)
Pt reports she woke up this morning with migraine and began vomiting (pt reports it is yellow "like bile").

## 2018-06-18 ENCOUNTER — Inpatient Hospital Stay
Admit: 2018-06-18 | Discharge: 2018-06-18 | Disposition: A | Discharge status: Home / Self Care | Payer: PRIVATE HEALTH INSURANCE | Attending: Emergency Medicine

## 2018-06-18 LAB — METABOLIC PANEL, COMPREHENSIVE
A-G Ratio: 1.4 (ref 1.1–2.2)
ALT (SGPT): 54 U/L (ref 12–78)
AST (SGOT): 27 U/L (ref 15–37)
Albumin: 4.6 g/dL (ref 3.5–5.0)
Alk. phosphatase: 137 U/L — ABNORMAL HIGH (ref 45–117)
Anion gap: 11 mmol/L (ref 5–15)
BUN/Creatinine ratio: 19 (ref 12–20)
BUN: 13 MG/DL (ref 6–20)
Bilirubin, total: 0.7 MG/DL (ref 0.2–1.0)
CO2: 29 mmol/L (ref 21–32)
Calcium: 9.3 MG/DL (ref 8.5–10.1)
Chloride: 101 mmol/L (ref 97–108)
Creatinine: 0.69 MG/DL (ref 0.55–1.02)
GFR est AA: >60 mL/min/{1.73_m2} (ref >60)
GFR est non-AA: >60 mL/min/{1.73_m2} (ref >60)
Globulin: 3.2 g/dL (ref 2.0–4.0)
Glucose: 99 mg/dL (ref 65–100)
Potassium: 3.9 mmol/L (ref 3.5–5.1)
Protein, total: 7.8 g/dL (ref 6.4–8.2)
Sodium: 141 mmol/L (ref 136–145)

## 2018-06-18 LAB — CBC WITH AUTOMATED DIFF
ABS. BASOPHILS: 0 10*3/uL (ref 0.0–0.1)
ABS. EOSINOPHILS: 0 10*3/uL (ref 0.0–0.4)
ABS. IMM. GRANS.: 0 10*3/uL (ref 0.00–0.04)
ABS. LYMPHOCYTES: 0.8 10*3/uL (ref 0.8–3.5)
ABS. MONOCYTES: 0.4 10*3/uL (ref 0.0–1.0)
ABS. NEUTROPHILS: 7.5 10*3/uL (ref 1.8–8.0)
ABSOLUTE NRBC: 0 10*3/uL (ref 0.00–0.01)
BASOPHILS: 0 % (ref 0–1)
EOSINOPHILS: 0 % (ref 0–7)
HCT: 44.3 % (ref 35.0–47.0)
HGB: 14.6 g/dL (ref 11.5–16.0)
IMMATURE GRANULOCYTES: 0 % (ref 0.0–0.5)
LYMPHOCYTES: 9 % — ABNORMAL LOW (ref 12–49)
MCH: 26.6 PG (ref 26.0–34.0)
MCHC: 33 g/dL (ref 30.0–36.5)
MCV: 80.8 FL (ref 80.0–99.0)
MONOCYTES: 5 % (ref 5–13)
MPV: 11.5 FL (ref 8.9–12.9)
NEUTROPHILS: 86 % — ABNORMAL HIGH (ref 32–75)
NRBC: 0 PER 100 WBC
PLATELET: 194 10*3/uL (ref 150–400)
RBC: 5.48 M/uL — ABNORMAL HIGH (ref 3.80–5.20)
RDW: 12.8 % (ref 11.5–14.5)
WBC: 8.7 10*3/uL (ref 3.6–11.0)

## 2018-06-18 LAB — LIPASE
Lipase: 116 U/L (ref 73–393)
Lipase: 116 U/L (ref 73–393)

## 2018-06-18 LAB — CBC WITH AUTO DIFFERENTIAL
Basophils %: 0 % (ref 0–1)
Basophils Absolute: 0 10*3/uL (ref 0.0–0.1)
Eosinophils %: 0 % (ref 0–7)
Eosinophils Absolute: 0 10*3/uL (ref 0.0–0.4)
Granulocyte Absolute Count: 0 10*3/uL (ref 0.00–0.04)
Hematocrit: 44.3 % (ref 35.0–47.0)
Hemoglobin: 14.6 g/dL (ref 11.5–16.0)
Immature Granulocytes: 0 % (ref 0.0–0.5)
Lymphocytes %: 9 % — ABNORMAL LOW (ref 12–49)
Lymphocytes Absolute: 0.8 10*3/uL (ref 0.8–3.5)
MCH: 26.6 PG (ref 26.0–34.0)
MCHC: 33 g/dL (ref 30.0–36.5)
MCV: 80.8 FL (ref 80.0–99.0)
MPV: 11.5 FL (ref 8.9–12.9)
Monocytes %: 5 % (ref 5–13)
Monocytes Absolute: 0.4 10*3/uL (ref 0.0–1.0)
NRBC Absolute: 0 10*3/uL (ref 0.00–0.01)
Neutrophils %: 86 % — ABNORMAL HIGH (ref 32–75)
Neutrophils Absolute: 7.5 10*3/uL (ref 1.8–8.0)
Nucleated RBCs: 0 PER 100 WBC
Platelets: 194 10*3/uL (ref 150–400)
RBC: 5.48 M/uL — ABNORMAL HIGH (ref 3.80–5.20)
RDW: 12.8 % (ref 11.5–14.5)
WBC: 8.7 10*3/uL (ref 3.6–11.0)

## 2018-06-18 LAB — COMPREHENSIVE METABOLIC PANEL
ALT: 54 U/L (ref 12–78)
AST: 27 U/L (ref 15–37)
Albumin/Globulin Ratio: 1.4 (ref 1.1–2.2)
Albumin: 4.6 g/dL (ref 3.5–5.0)
Alkaline Phosphatase: 137 U/L — ABNORMAL HIGH (ref 45–117)
Anion Gap: 11 mmol/L (ref 5–15)
BUN: 13 MG/DL (ref 6–20)
Bun/Cre Ratio: 19 (ref 12–20)
CO2: 29 mmol/L (ref 21–32)
Calcium: 9.3 MG/DL (ref 8.5–10.1)
Chloride: 101 mmol/L (ref 97–108)
Creatinine: 0.69 MG/DL (ref 0.55–1.02)
EGFR IF NonAfrican American: >60 mL/min/{1.73_m2} (ref >60)
GFR African American: >60 mL/min/{1.73_m2} (ref >60)
Globulin: 3.2 g/dL (ref 2.0–4.0)
Glucose: 99 mg/dL (ref 65–100)
Potassium: 3.9 mmol/L (ref 3.5–5.1)
Sodium: 141 mmol/L (ref 136–145)
Total Bilirubin: 0.7 MG/DL (ref 0.2–1.0)
Total Protein: 7.8 g/dL (ref 6.4–8.2)

## 2018-06-18 MED ORDER — SODIUM CHLORIDE 0.9% BOLUS IV
0.9 % | Freq: Once | INTRAVENOUS | Status: AC
Start: 2018-06-18 — End: 2018-06-17
  Administered 2018-06-18: 02:00:00 via INTRAVENOUS

## 2018-06-18 MED ORDER — SODIUM CHLORIDE 0.9 % INJECTION
5 mg/mL | INTRAMUSCULAR | Status: AC
Start: 2018-06-18 — End: 2018-06-17
  Administered 2018-06-18: 02:00:00 via INTRAVENOUS

## 2018-06-18 MED ORDER — ONDANSETRON 4 MG TAB, RAPID DISSOLVE
4 mg | ORAL_TABLET | Freq: Three times a day (TID) | ORAL | 0 refills | Status: AC | PRN
Start: 2018-06-18 — End: 2018-06-20

## 2018-06-18 MED ORDER — DIPHENHYDRAMINE HCL 50 MG/ML IJ SOLN
50 mg/mL | INTRAMUSCULAR | Status: AC
Start: 2018-06-18 — End: 2018-06-17
  Administered 2018-06-18: 02:00:00 via INTRAVENOUS

## 2018-06-18 MED ORDER — SUMATRIPTAN 50 MG TAB
50 mg | ORAL_TABLET | ORAL | 0 refills | Status: DC | PRN
Start: 2018-06-18 — End: 2019-11-07

## 2018-06-18 MED FILL — PROCHLORPERAZINE EDISYLATE 5 MG/ML INJECTION: 5 mg/mL | INTRAMUSCULAR | Qty: 2

## 2018-06-18 MED FILL — DIPHENHYDRAMINE HCL 50 MG/ML IJ SOLN: 50 mg/mL | INTRAMUSCULAR | Qty: 0.5

## 2018-06-18 MED FILL — SODIUM CHLORIDE 0.9 % IV: INTRAVENOUS | Qty: 1000

## 2018-06-18 MED FILL — DIPHENHYDRAMINE HCL 50 MG/ML IJ SOLN: 50 mg/mL | INTRAMUSCULAR | Qty: 1

## 2018-06-20 ENCOUNTER — Inpatient Hospital Stay: Admit: 2018-06-20 | Payer: PRIVATE HEALTH INSURANCE | Primary: Family Medicine

## 2018-06-20 DIAGNOSIS — M25512 Pain in left shoulder: Secondary | ICD-10-CM

## 2018-06-20 NOTE — Progress Notes (Signed)
PT DAILY TREATMENT NOTE 2-15    Patient Name: Sara Cooke  Date:06/20/2018  DOB: 12/24/1970  [x]   Patient DOB Verified  Payor: [employer] [place] / Plan: [employer] / Product Type: Commerical /    In time:735  Out time:843  Total Treatment Time (min): 68  Visit #:  14    Treatment Area: Pain in left shoulder [M25.512]    SUBJECTIVE  Pain Level (0-10 scale): 2/10  Any medication changes, allergies to medications, adverse drug reactions, diagnosis change, or new procedure performed?: [x]  No    []  Yes (see summary sheet for update)  Subjective functional status/changes:   []  No changes reported  Patient reports she went to the ER this weekend because she had severe migraines and was dehydrated. They gave her an IV and she began to feel much better.   She continues to feel popping in her L shoulder with most movements.    OBJECTIVE   ?? ?? ?? ??   Modality rationale: decrease inflammation and decrease pain??to improve the patient???s ability to perform ADLs and reduce pain levels ??   Min Type Additional Details   ??  ?? [] ??????Estim: [] ????Att ????[] ????Unatt ??????[] ????TENS instruct  ????????????????????????????????[] ????IFC ??[] ????Premod ????[] ????NMES ??  ??????????????????????????????????[] ????Other:  [] ????w/US ????[] ????w/ice ????[] ????w/heat  Position:  Location:   ??  ?? [] ????????Traction: [] ??????Cervical ????????????[] ????Lumbar  ??????????????????????????????????????????[] ??????Prone ??????????????????[] ????Supine  ??????????????????????????????????????????[] ????Intermittent ????[] ????Continuous Lbs:  [] ??????before manual  [] ??????after manual  [] ????w/heat   ?? [] ????????Ultrasound: [] ????Continuous ????[] ??????Pulsed??  ????????????????????????????????????????at:??[] ???? ????[] ???? Location:  W/cm2:   ?? [] ??????Paraffin   ?? Location:   [] ????w/heat   ?? [] ????????Ice ????????[] ????????Heat  [] ????????Ice massage Position:  Location:   ?? [] ????????Laser  [] ????????Other: Position:  Location:   ??  15 [x] ????????Vasopneumatic Device Pressure: ????????????[] ??????lo [x] ??????med [] ??????hi   Temperature:??34   ??   [x] ??????Skin assessment post-treatment: ??[x] ????intact [] ????redness- no adverse reaction  ????[] ????redness ?????adverse reaction:??  ??  ??  ??  38 min Therapeutic Exercise: ??[x] ??????See flow sheet :   Rationale:??increase ROM and increase strength??to improve the patient???s ability to perform ADLs and reduce pain levels  ??  15 min Manual Therapy:????PROM with inf and post GH mobs, STM to UT and levator   Rationale:??decrease pain, increase ROM, increase tissue extensibility, decrease trigger points and increase postural awareness????to improve the patient???s ability to??perform ADLs and reduce pain levels  ??????????????????????????????????????????????  With   []  TE   []  TA   []  neuro   []  other: Patient Education: [x]  Review HEP    []  Progressed/Changed HEP based on:   []  positioning   []  body mechanics   []  transfers   []  heat/ice application    []  other:      Other Objective/Functional Measures: none noted     Pain Level (0-10 scale) post treatment: 0/10    ASSESSMENT/Changes in Function:   Patient demonstrates full PROM in all planes other than ER. Continues to show restrictions along the upper trap. Will continue to progress as tolerated.  Patient will continue to benefit from skilled PT services to modify and progress therapeutic interventions, address functional mobility deficits, address ROM deficits, address strength deficits, analyze and address soft tissue restrictions, analyze and cue movement patterns, analyze and modify body mechanics/ergonomics and assess and modify postural abnormalities to attain remaining goals.     [x]   See Plan of Care  []   See progress note/recertification  []   See Discharge Summary         Progress towards goals / Updated goals:  Patient is  progressing towards goals.     PLAN  [x]   Upgrade activities as tolerated     [x]   Continue plan of care  [x]   Update interventions per flow sheet       []   Discharge due to:_  []   Other:_      Sharlet Salina T Iran Kievit 06/20/2018

## 2018-06-20 NOTE — Progress Notes (Signed)
 PT DAILY TREATMENT NOTE 2-15    Patient Name: Sara Cooke  Date:06/20/2018  DOB: Mar 08, 1971  [x]   Patient DOB Verified  Payor: [employer] [place]  / Plan: [employer] / Product Type: Com[employer]ical /    In time:735  Out time:843  Total Treatment Time (min): 68  Visit #:  14    Treatment Area: Pain in left shoulder [M25.512]    SUBJECTIVE  Pain Level (0-10 scale): 2/10  Any medication changes, allergies to medications, adverse drug reactions, diagnosis change, or new procedure performed?: [x]  No    []  Yes (see summary sheet for update)  Subjective functional status/changes:   []  No changes reported  Patient reports she went to the ER this weekend because she had severe migraines and was dehydrated. They gave her an IV and she began to feel much better.   She continues to feel popping in her L shoulder with most movements.    OBJECTIVE         Modality rationale: decrease inflammation and decrease painto improve the patient's ability to perform ADLs and reduce pain levels    Min Type Additional Details      [] ????Estim: [] ????Att [] ????Unatt [] ????TENS instruct  [] ????IFC [] ????Premod [] ????NMES   [] ????Other:  [] ????w/US  [] ????w/ice [] ????w/heat  Position:  Location:      [] ????Traction: [] ????Cervical [] ????Lumbar  [] ????Prone [] ????Supine  [] ????Intermittent [] ????Continuous Lbs:  [] ????before manual  [] ????after manual  [] ????w/heat    [] ????Ultrasound: [] ????Continuous [] ????Pulsed  at:[] ???? [] ???? Location:  W/cm2:    [] ????Paraffin    Location:   [] ????w/heat    [] ????Ice [] ????Heat  [] ????Ice massage Position:  Location:    [] ????Laser  [] ????Other: Position:  Location:     15 [x] ????Vasopneumatic Device Pressure: [] ????lo [x] ????med [] ????hi   Temperature:34     [x] ????Skin assessment  post-treatment: [x] ????intact [] ????redness- no adverse reaction  [] ????redness -adverse reaction:        38 min Therapeutic Exercise: [x] ????See flow sheet :   Rationale:increase ROM and increase strengthto improve the patient's ability to perform ADLs and reduce pain levels    15 min Manual Therapy:PROM with inf and post GH mobs, STM to UT and levator   Rationale:decrease pain, increase ROM, increase tissue extensibility, decrease trigger points and increase postural awarenessto improve the patient's ability toperform ADLs and reduce pain levels    With   []  TE   []  TA   []  neuro   []  other: Patient Education: [x]  Review HEP    []  Progressed/Changed HEP based on:   []  positioning   []  body mechanics   []  transfers   []  heat/ice application    []  other:      Other Objective/Functional Measures: none noted     Pain Level (0-10 scale) post treatment: 0/10    ASSESSMENT/Changes in Function:   Patient demonstrates full PROM in all planes other than ER. Continues to show restrictions along the upper trap. Will continue to progress as tolerated.  Patient will continue to benefit from skilled PT services to modify and progress therapeutic interventions, address functional mobility deficits, address ROM deficits, address strength deficits, analyze and address soft tissue restrictions, analyze and cue movement patterns, analyze and modify body mechanics/ergonomics and assess and modify postural abnormalities to attain remaining goals.     [x]   See Plan of Care  []   See progress note/recertification  []   See Discharge Summary         Progress towards goals / Updated goals:  Patient is  progressing towards goals.     PLAN  [x]   Upgrade activities as tolerated     [x]   Continue plan of care  [x]   Update interventions per flow sheet       []   Discharge due to:_  []   Other:_      [name] Shoulder 06/20/2018

## 2018-06-23 ENCOUNTER — Inpatient Hospital Stay
Admit: 2018-06-23 | Payer: PRIVATE HEALTH INSURANCE | Attending: Rehabilitative and Restorative Service Providers" | Primary: Family Medicine

## 2018-06-23 NOTE — Progress Notes (Signed)
PT DAILY TREATMENT NOTE 2-15    Patient Name: Sara Cooke  Date:06/23/2018  DOB: 07/22/1970  [x]   Patient DOB Verified  Payor: [employer] [place] / Plan: [employer] / Product Type: Commerical /    In time:712  Out time:824  Total Treatment Time (min): 72  Visit #:  15    Treatment Area: Pain in left shoulder [M25.512]    SUBJECTIVE  Pain Level (0-10 scale): 2/10  Any medication changes, allergies to medications, adverse drug reactions, diagnosis change, or new procedure performed?: [x]  No    []  Yes (see summary sheet for update)  Subjective functional status/changes:   []  No changes reported  Pt reports that she has been placed on BP medication secondary to stress.      OBJECTIVE   ?? ?? ?? ??   Modality rationale: decrease inflammation and decrease pain??to improve the patient???s ability to perform ADLs and reduce pain levels ??   Min Type Additional Details   ??  ?? [] ??????Estim: [] ????Att ????[] ????Unatt ??????[] ????TENS instruct  ????????????????????????????????[] ????IFC ??[] ????Premod ????[] ????NMES ??  ??????????????????????????????????[] ????Other:  [] ????w/US ????[] ????w/ice ????[] ????w/heat  Position:  Location:   ??  ?? [] ????????Traction: [] ??????Cervical ????????????[] ????Lumbar  ??????????????????????????????????????????[] ??????Prone ??????????????????[] ????Supine  ??????????????????????????????????????????[] ????Intermittent ????[] ????Continuous Lbs:  [] ??????before manual  [] ??????after manual  [] ????w/heat   ?? [] ????????Ultrasound: [] ????Continuous ????[] ??????Pulsed??  ????????????????????????????????????????at:??[] ???? ????[] ???? Location:  W/cm2:   ?? [] ??????Paraffin   ?? Location:   [] ????w/heat   ?? [] ????????Ice ????????[] ????????Heat  [] ????????Ice massage Position:  Location:   ?? [] ????????Laser  [] ????????Other: Position:  Location:   ??  15 [x] ????????Vasopneumatic Device Pressure: ????????????[] ??????lo [x] ??????med [] ??????hi   Temperature:??34   ??  [x] ??????Skin assessment post-treatment: ??[x] ????intact [] ????redness- no adverse reaction  ????[] ????redness ?????adverse reaction:??  ??  ??  ??   40 min Therapeutic Exercise: ??[x] ??????See flow sheet :   Rationale:??increase ROM and increase strength??to improve the patient???s ability to perform ADLs and reduce pain levels  ??  17 min Manual Therapy:????PROM with inf and post GH mobs, STM to UT and levator   Rationale:??decrease pain, increase ROM, increase tissue extensibility, decrease trigger points and increase postural awareness????to improve the patient???s ability to??perform ADLs and reduce pain levels  ??????????????????????????????????????????????  With   []  TE   []  TA   []  neuro   []  other: Patient Education: [x]  Review HEP    []  Progressed/Changed HEP based on:   []  positioning   []  body mechanics   []  transfers   []  heat/ice application    []  other:      Other Objective/Functional Measures: none noted     Pain Level (0-10 scale) post treatment: 0-1/10    ASSESSMENT/Changes in Function:   Pt is progressing.  Needs cues to set her shoulder blade and lacks the control to do so.  This lack of scap stability is putting greater stress on the cuff and GH joint when asked to complete there-ex.  Will continue to work on strength as PROM is nearing full ROM.    Patient will continue to benefit from skilled PT services to modify and progress therapeutic interventions, address functional mobility deficits, address ROM deficits, address strength deficits, analyze and address soft tissue restrictions, analyze and cue movement patterns, analyze and modify body mechanics/ergonomics and assess and modify postural abnormalities to attain remaining goals.     [x]   See Plan of Care  []   See progress note/recertification  []   See Discharge Summary         Progress towards goals / Updated goals:  Patient is progressing towards goals.     PLAN  [x]   Upgrade activities as tolerated     [x]   Continue plan of care  [x]   Update interventions per flow sheet       []   Discharge due to:_  []   Other:_      [name], PT 06/23/2018

## 2018-06-23 NOTE — Progress Notes (Signed)
Patient identified as eligible for  Employee Care Management services.  Second telephone outreach attempted.  Left discreet voicemail with this CM confidential contact information. Will send UTR letter.

## 2018-06-23 NOTE — Progress Notes (Signed)
Patient identified as eligible for [facility] Employee Care Management services.  Second telephone outreach attempted.  Left discreet voicemail with this CM confidential contact information. Will send UTR letter.

## 2018-06-23 NOTE — Progress Notes (Signed)
 PT DAILY TREATMENT NOTE 2-15    Patient Name: Sara Cooke  Date:06/23/2018  DOB: December 03, 1970  [x]   Patient DOB Verified  Payor: [employer] [place]  / Plan: [employer] / Product Type: Commerical /    In time:712  Out time:824  Total Treatment Time (min): 72  Visit #:  15    Treatment Area: Pain in left shoulder [M25.512]    SUBJECTIVE  Pain Level (0-10 scale): 2/10  Any medication changes, allergies to medications, adverse drug reactions, diagnosis change, or new procedure performed?: [x]  No    []  Yes (see summary sheet for update)  Subjective functional status/changes:   []  No changes reported  Pt reports that she has been placed on BP medication secondary to stress.      OBJECTIVE         Modality rationale: decrease inflammation and decrease painto improve the patient's ability to perform ADLs and reduce pain levels    Min Type Additional Details      [] ????Estim: [] ????Att [] ????Unatt [] ????TENS instruct  [] ????IFC [] ????Premod [] ????NMES   [] ????Other:  [] ????w/US  [] ????w/ice [] ????w/heat  Position:  Location:      [] ????Traction: [] ????Cervical [] ????Lumbar  [] ????Prone [] ????Supine  [] ????Intermittent [] ????Continuous Lbs:  [] ????before manual  [] ????after manual  [] ????w/heat    [] ????Ultrasound: [] ????Continuous [] ????Pulsed  at:[] ???? [] ???? Location:  W/cm2:    [] ????Paraffin    Location:   [] ????w/heat    [] ????Ice [] ????Heat  [] ????Ice massage Position:  Location:    [] ????Laser  [] ????Other: Position:  Location:     15 [x] ????Vasopneumatic Device Pressure: [] ????lo [x] ????med [] ????hi   Temperature:34     [x] ????Skin assessment post-treatment: [x] ????intact [] ????redness- no adverse reaction  [] ????redness -adverse reaction:        40 min Therapeutic Exercise:  [x] ????See flow sheet :   Rationale:increase ROM and increase strengthto improve the patient's ability to perform ADLs and reduce pain levels    17 min Manual Therapy:PROM with inf and post GH mobs, STM to UT and levator   Rationale:decrease pain, increase ROM, increase tissue extensibility, decrease trigger points and increase postural awarenessto improve the patient's ability toperform ADLs and reduce pain levels    With   []  TE   []  TA   []  neuro   []  other: Patient Education: [x]  Review HEP    []  Progressed/Changed HEP based on:   []  positioning   []  body mechanics   []  transfers   []  heat/ice application    []  other:      Other Objective/Functional Measures: none noted     Pain Level (0-10 scale) post treatment: 0-1/10    ASSESSMENT/Changes in Function:   Pt is progressing.  Needs cues to set her shoulder blade and lacks the control to do so.  This lack of scap stability is putting greater stress on the cuff and GH joint when asked to complete there-ex.  Will continue to work on strength as PROM is nearing full ROM.    Patient will continue to benefit from skilled PT services to modify and progress therapeutic interventions, address functional mobility deficits, address ROM deficits, address strength deficits, analyze and address soft tissue restrictions, analyze and cue movement patterns, analyze and modify body mechanics/ergonomics and assess and modify postural abnormalities to attain remaining goals.     [x]   See Plan of Care  []   See progress note/recertification  []   See Discharge Summary         Progress towards goals / Updated goals:  Patient is progressing towards goals.     PLAN  [x]   Upgrade activities as tolerated     [x]   Continue plan of care  [x]   Update interventions per flow sheet       []   Discharge due to:_  []   Other:_      Alm [name], PT 06/23/2018

## 2018-06-27 ENCOUNTER — Inpatient Hospital Stay: Admit: 2018-06-27 | Payer: PRIVATE HEALTH INSURANCE | Primary: Family Medicine

## 2018-06-27 NOTE — Progress Notes (Signed)
PT DAILY TREATMENT NOTE - MCR 2-15    Patient Name: Sara Cooke  Date:06/27/2018  DOB: [birth year]  [x]   Patient DOB Verified  Payor: [employer] [place] / Plan: [employer] / Product Type: Commerical /    In time:733  Out time:840  Total Treatment Time (min): 67  Total Timed Codes (min): 67  1:1 Treatment Time (MC only): 40   Visit #:  16    Treatment Area: Pain in left shoulder [M25.512]    SUBJECTIVE  Pain Level (0-10 scale): 2/10  Any medication changes, allergies to medications, adverse drug reactions, diagnosis change, or new procedure performed?: [x]  No    []  Yes (see summary sheet for update)  Subjective functional status/changes:   []  No changes reported  Patient reports she has her new doctors appointment the 19th.   Feels like her shoulder is not popping quite as much as it has been.     OBJECTIVE   ?? ??     Modality rationale: decrease inflammation and decrease pain??to improve the patient???s ability to perform ADLs and reduce pain levels ??   Min Type Additional Details   ??  ?? [] ???????Estim: [] ?????Att ????[] ?????Unatt ??????[] ?????TENS instruct  ????????????????????????????????[] ?????IFC ??[] ?????Premod ????[] ?????NMES ??  ??????????????????????????????????[] ?????Other:  [] ?????w/US ????[] ?????w/ice ????[] ?????w/heat  Position:  Location:   ??  ?? [] ?????????Traction: [] ???????Cervical ????????????[] ?????Lumbar  ??????????????????????????????????????????[] ???????Prone ??????????????????[] ?????Supine  ??????????????????????????????????????????[] ?????Intermittent ????[] ?????Continuous Lbs:  [] ???????before manual  [] ???????after manual  [] ?????w/heat   ?? [] ?????????Ultrasound: [] ?????Continuous ????[] ???????Pulsed??  ????????????????????????????????????????at:??[] ????? ????[] ????? Location:  W/cm2:   ?? [] ???????Paraffin   ?? Location:   [] ?????w/heat   ?? [] ?????????Ice ????????[] ?????????Heat  [] ?????????Ice massage Position:  Location:   ?? [] ?????????Laser  [] ?????????Other: Position:  Location:   ??  15 [x] ?????????Vasopneumatic Device Pressure: ????????????[] ???????lo [x] ???????med [] ???????hi   Temperature:??34   ??   [x] ???????Skin assessment post-treatment: ??[x] ?????intact [] ?????redness- no adverse reaction  ????[] ?????redness ?????adverse reaction:??  ??  ??  ??  42 min Therapeutic Exercise: ??[x] ???????See flow sheet :   Rationale:??increase ROM and increase strength??to improve the patient???s ability to perform ADLs and reduce pain levels  ??  10 min Manual Therapy:????PROM with inf and post GH mobs, STM to UT and levator   Rationale:??decrease pain, increase ROM, increase tissue extensibility, decrease trigger points and increase postural awareness????to improve the patient???s ability to??perform ADLs and reduce pain levels          With   []  TE   []  TA   []  neuro   []  other: Patient Education: [x]  Review HEP    []  Progressed/Changed HEP based on:   []  positioning   []  body mechanics   []  transfers   []  heat/ice application    []  other:      Other Objective/Functional Measures: none noted     Pain Level (0-10 scale) post treatment: 1/10    ASSESSMENT/Changes in Function:   Patient demonstrates near full PROM. Fatigues quickly with stability based therex. Tolerated all UE weightbearing therex well. Will continue to progress as tolerated.  Patient will continue to benefit from skilled PT services to modify and progress therapeutic interventions, address functional mobility deficits, address ROM deficits, address strength deficits, analyze and address soft tissue restrictions, analyze and cue movement patterns, analyze and modify body mechanics/ergonomics and assess and modify postural abnormalities to attain remaining goals.     [x]   See Plan of Care  []   See progress note/recertification  []   See Discharge Summary         Progress towards goals /  Updated goals:  Patient is progressing towards goals.      PLAN  [x]   Upgrade activities as tolerated     [x]   Continue plan of care  [x]   Update interventions per flow sheet       []   Discharge due to:_  []   Other:_      Sharlet Salina T Yoshito Gaza 06/27/2018

## 2018-06-27 NOTE — Progress Notes (Signed)
PT DAILY TREATMENT NOTE - MCR 2-15    Patient Name: Sara Cooke  Date:06/27/2018  DOB: [birth year]  [x]   Patient DOB Verified  Payor: [employer] [place] / Plan: [employer] / Product Type: Commerical /    In time:733  Out time:840  Total Treatment Time (min): 67  Total Timed Codes (min): 67  1:1 Treatment Time (MC only): 40   Visit #:  16    Treatment Area: Pain in left shoulder [M25.512]    SUBJECTIVE  Pain Level (0-10 scale): 2/10  Any medication changes, allergies to medications, adverse drug reactions, diagnosis change, or new procedure performed?: [x]  No    []  Yes (see summary sheet for update)  Subjective functional status/changes:   []  No changes reported  Patient reports she has her new doctors appointment the 19th.   Feels like her shoulder is not popping quite as much as it has been.     OBJECTIVE         Modality rationale: decrease inflammation and decrease painto improve the patient's ability to perform ADLs and reduce pain levels    Min Type Additional Details      [] ?????Estim: [] ?????Att [] ?????Unatt [] ?????TENS instruct  [] ?????IFC [] ?????Premod [] ?????NMES   [] ?????Other:  [] ?????w/US [] ?????w/ice [] ?????w/heat  Position:  Location:      [] ?????Traction: [] ?????Cervical [] ?????Lumbar  [] ?????Prone [] ?????Supine  [] ?????Intermittent [] ?????Continuous Lbs:  [] ?????before manual  [] ?????after manual  [] ?????w/heat    [] ?????Ultrasound: [] ?????Continuous [] ?????Pulsed  at:[] ????? [] ????? Location:  W/cm2:    [] ?????Paraffin    Location:   [] ?????w/heat    [] ?????Ice [] ?????Heat  [] ?????Ice massage Position:  Location:    [] ?????Laser  [] ?????Other: Position:  Location:     15 [x] ?????Vasopneumatic Device Pressure: [] ?????lo [x] ?????med [] ?????hi   Temperature:34     [x] ?????Skin  assessment post-treatment: [x] ?????intact [] ?????redness- no adverse reaction  [] ?????redness -adverse reaction:        42 min Therapeutic Exercise: [x] ?????See flow sheet :   Rationale:increase ROM and increase strengthto improve the patient's ability to perform ADLs and reduce pain levels    10 min Manual Therapy:PROM with inf and post GH mobs, STM to UT and levator   Rationale:decrease pain, increase ROM, increase tissue extensibility, decrease trigger points and increase postural awarenessto improve the patient's ability toperform ADLs and reduce pain levels          With   []  TE   []  TA   []  neuro   []  other: Patient Education: [x]  Review HEP    []  Progressed/Changed HEP based on:   []  positioning   []  body mechanics   []  transfers   []  heat/ice application    []  other:      Other Objective/Functional Measures: none noted     Pain Level (0-10 scale) post treatment: 1/10    ASSESSMENT/Changes in Function:   Patient demonstrates near full PROM. Fatigues quickly with stability based therex. Tolerated all UE weightbearing therex well. Will continue to progress as tolerated.  Patient will continue to benefit from skilled PT services to modify and progress therapeutic interventions, address functional mobility deficits, address ROM deficits, address strength deficits, analyze and address soft tissue restrictions, analyze and cue movement patterns, analyze and modify body mechanics/ergonomics and assess and modify postural abnormalities to attain remaining goals.     [x]   See Plan of Care  []   See progress note/recertification  []   See Discharge Summary         Progress towards goals /  Updated goals:  Patient is progressing towards goals.      PLAN  [x]   Upgrade activities as tolerated     [x]   Continue plan of care  [x]   Update interventions per flow sheet       []   Discharge due to:_  []   Other:_      [name] 06/27/2018

## 2018-06-29 ENCOUNTER — Inpatient Hospital Stay
Admit: 2018-06-29 | Payer: PRIVATE HEALTH INSURANCE | Attending: Rehabilitative and Restorative Service Providers" | Primary: Family Medicine

## 2018-06-29 NOTE — Progress Notes (Signed)
 PT DAILY TREATMENT NOTE - MCR 2-15    Patient Name: Sara Cooke  Date:06/29/2018  DOB: 1970/06/29  [x]   Patient DOB Verified  Payor: [employer] [place]  / Plan: [employer] / Product Type: Commerical /    In time:222  Out time:348  Total Treatment Time (min): 88  Total Timed Codes (min): 73   Visit #:  17    Treatment Area: Pain in left shoulder [M25.512]    SUBJECTIVE  Pain Level (0-10 scale): 3/10  Any medication changes, allergies to medications, adverse drug reactions, diagnosis change, or new procedure performed?: [x]  No    []  Yes (see summary sheet for update)  Subjective functional status/changes:   []  No changes reported  Patient is progressing and states that she is feeling better    OBJECTIVE         Modality rationale: decrease inflammation and decrease painto improve the patient's ability to perform ADLs and reduce pain levels    Min Type Additional Details      [] ?????Estim: [] ?????Att [] ?????Unatt [] ?????TENS instruct  [] ?????IFC [] ?????Premod [] ?????NMES   [] ?????Other:  [] ?????w/US  [] ?????w/ice [] ?????w/heat  Position:  Location:      [] ?????Traction: [] ?????Cervical [] ?????Lumbar  [] ?????Prone [] ?????Supine  [] ?????Intermittent [] ?????Continuous Lbs:  [] ?????before manual  [] ?????after manual  [] ?????w/heat    [] ?????Ultrasound: [] ?????Continuous [] ?????Pulsed  at:[] ????? [] ????? Location:  W/cm2:    [] ?????Paraffin    Location:   [] ?????w/heat    [] ?????Ice [] ?????Heat  [] ?????Ice massage Position:  Location:    [] ?????Laser  [] ?????Other: Position:  Location:     15 [x] ?????Vasopneumatic Device Pressure: [] ?????lo [x] ?????med [] ?????hi   Temperature:34     [x] ?????Skin assessment post-treatment: [x] ?????intact [] ?????redness- no adverse reaction  [] ?????redness -adverse  reaction:        52 min Therapeutic Exercise: [x] ?????See flow sheet :   Rationale:increase ROM and increase strengthto improve the patient's ability to perform ADLs and reduce pain levels    21 min Manual Therapy:PROM with inf and post GH mobs, STM to UT and levator   Rationale:decrease pain, increase ROM, increase tissue extensibility, decrease trigger points and increase postural awarenessto improve the patient's ability toperform ADLs and reduce pain levels          With   []  TE   []  TA   []  neuro   []  other: Patient Education: [x]  Review HEP    []  Progressed/Changed HEP based on:   []  positioning   []  body mechanics   []  transfers   []  heat/ice application    []  other:      Other Objective/Functional Measures: none noted     Pain Level (0-10 scale) post treatment: 1/10    ASSESSMENT/Changes in Function:   Pt is progressing and tolerating more ER and abduction as she is able to stop guarding in the UT.  Will continue to shift focus to strengthening and CKC activity   Patient will continue to benefit from skilled PT services to modify and progress therapeutic interventions, address functional mobility deficits, address ROM deficits, address strength deficits, analyze and address soft tissue restrictions, analyze and cue movement patterns, analyze and modify body mechanics/ergonomics and assess and modify postural abnormalities to attain remaining goals.     [x]   See Plan of Care  []   See progress note/recertification  []   See Discharge Summary         Progress towards goals / Updated goals:  Patient is progressing towards goals.      PLAN  [x]   Upgrade activities as tolerated     [x]   Continue plan of care  [x]   Update interventions per flow sheet       []   Discharge due to:_  []   Other:_      Alm [name], PT 06/29/2018

## 2018-07-01 NOTE — Progress Notes (Signed)
PT DAILY TREATMENT NOTE - MCR 2-15    Patient Name: Sara Cooke  Date:06/29/2018  DOB: [birth year]  [x]   Patient DOB Verified  Payor: [employer] [place] / Plan: [employer] / Product Type: Commerical /    In time:222  Out time:348  Total Treatment Time (min): 88  Total Timed Codes (min): 73   Visit #:  17    Treatment Area: Pain in left shoulder [M25.512]    SUBJECTIVE  Pain Level (0-10 scale): 3/10  Any medication changes, allergies to medications, adverse drug reactions, diagnosis change, or new procedure performed?: [x]  No    []  Yes (see summary sheet for update)  Subjective functional status/changes:   []  No changes reported  Patient is progressing and states that she is feeling better    OBJECTIVE   ?? ??     Modality rationale: decrease inflammation and decrease pain??to improve the patient???s ability to perform ADLs and reduce pain levels ??   Min Type Additional Details   ??  ?? [] ???????Estim: [] ?????Att ????[] ?????Unatt ??????[] ?????TENS instruct  ????????????????????????????????[] ?????IFC ??[] ?????Premod ????[] ?????NMES ??  ??????????????????????????????????[] ?????Other:  [] ?????w/US ????[] ?????w/ice ????[] ?????w/heat  Position:  Location:   ??  ?? [] ?????????Traction: [] ???????Cervical ????????????[] ?????Lumbar  ??????????????????????????????????????????[] ???????Prone ??????????????????[] ?????Supine  ??????????????????????????????????????????[] ?????Intermittent ????[] ?????Continuous Lbs:  [] ???????before manual  [] ???????after manual  [] ?????w/heat   ?? [] ?????????Ultrasound: [] ?????Continuous ????[] ???????Pulsed??  ????????????????????????????????????????at:??[] ????? ????[] ????? Location:  W/cm2:   ?? [] ???????Paraffin   ?? Location:   [] ?????w/heat   ?? [] ?????????Ice ????????[] ?????????Heat  [] ?????????Ice massage Position:  Location:   ?? [] ?????????Laser  [] ?????????Other: Position:  Location:   ??  15 [x] ?????????Vasopneumatic Device Pressure: ????????????[] ???????lo [x] ???????med [] ???????hi   Temperature:??34   ??  [x] ???????Skin assessment post-treatment: ??[x] ?????intact [] ?????redness- no adverse reaction   ????[] ?????redness ?????adverse reaction:??  ??  ??  ??  52 min Therapeutic Exercise: ??[x] ???????See flow sheet :   Rationale:??increase ROM and increase strength??to improve the patient???s ability to perform ADLs and reduce pain levels  ??  21 min Manual Therapy:????PROM with inf and post GH mobs, STM to UT and levator   Rationale:??decrease pain, increase ROM, increase tissue extensibility, decrease trigger points and increase postural awareness????to improve the patient???s ability to??perform ADLs and reduce pain levels          With   []  TE   []  TA   []  neuro   []  other: Patient Education: [x]  Review HEP    []  Progressed/Changed HEP based on:   []  positioning   []  body mechanics   []  transfers   []  heat/ice application    []  other:      Other Objective/Functional Measures: none noted     Pain Level (0-10 scale) post treatment: 1/10    ASSESSMENT/Changes in Function:   Pt is progressing and tolerating more ER and abduction as she is able to stop guarding in the UT.  Will continue to shift focus to strengthening and CKC activity   Patient will continue to benefit from skilled PT services to modify and progress therapeutic interventions, address functional mobility deficits, address ROM deficits, address strength deficits, analyze and address soft tissue restrictions, analyze and cue movement patterns, analyze and modify body mechanics/ergonomics and assess and modify postural abnormalities to attain remaining goals.     [x]   See Plan of Care  []   See progress note/recertification  []   See Discharge Summary         Progress towards goals / Updated goals:  Patient is progressing towards goals.      PLAN  [x]   Upgrade activities as tolerated     [x]   Continue plan of care  [x]   Update interventions per flow sheet       []   Discharge due to:_  []   Other:_      [name], PT 06/29/2018

## 2018-07-04 ENCOUNTER — Inpatient Hospital Stay: Admit: 2018-07-04 | Payer: PRIVATE HEALTH INSURANCE | Primary: Family Medicine

## 2018-07-04 NOTE — Progress Notes (Signed)
PT DAILY TREATMENT NOTE 2-15    Patient Name: Sara Cooke  Date:07/04/2018  DOB: 11/29/1970   _0   Patient DOB Verified  Payor: [employer] [place] / Plan: [employer] [place] / Product Type: Commerical /    In time:738  Out time:848  Total Treatment Time (min): 70  Visit #:  18    Treatment Area: Pain in left shoulder [M25.512]    SUBJECTIVE  Pain Level (0-10 scale): 3/10  Any medication changes, allergies to medications, adverse drug reactions, diagnosis change, or new procedure performed?: _1  No    _2  Yes (see summary sheet for update)  Subjective functional status/changes:   _3  No changes reported  Patient reports the popping continues, she feels like motion has improved but it is still challenging.     OBJECTIVE           Modality rationale: decrease inflammation and decrease pain??to improve the patient???s ability to perform ADLs and reduce pain levels ??   Min Type Additional Details   ??  ?? _4 ????????Estim: _5 ??????Att ????_6 ??????Unatt ??????_7 ??????TENS instruct  ????????????????????????????????_8 ??????IFC ??_9 ??????Premod ????_10 ??????NMES ??  ??????????????????????????????????_11 ??????Other:  _12 ??????w/US ????_13 ??????w/ice ????_14 ??????w/heat  Position:  Location:   ??  ?? _15 ??????????Traction: _16 ????????Cervical ????????????_17 ??????Lumbar  ??????????????????????????????????????????_18 ????????Prone ??????????????????_19 ??????Supine  ??????????????????????????????????????????_20 ??????Intermittent ????_21 ??????Continuous Lbs:  _22 ????????before manual  _23 ????????after manual  _24 ??????w/heat   ?? _25 ??????????Ultrasound: _26 ??????Continuous ????_27 ????????Pulsed??  ????????????????????????????????????????at:??_28 ??????1MHz ????_29 ??????3MHz Location:  W/cm2:   ?? _30 ????????Paraffin   ?? Location:   _31 ??????w/heat   ?? _32 ??????????Ice ????????_33 ??????????Heat  _34 ??????????Ice massage Position:  Location:   ?? _35 ??????????Laser  _36 ??????????Other: Position:  Location:   ??  15 _37 ??????????Vasopneumatic Device Pressure: ????????????_38 ????????lo _39 ????????med _40 ????????hi   Temperature:??34   ??  _41 ????????Skin assessment post-treatment: ??_42 ??????intact  _43 ??????redness- no adverse reaction  ????_44 ??????redness ?????adverse reaction:??  ??  ??  ??  45 min Therapeutic Exercise: ??_45 ????????See flow sheet :   Rationale:??increase ROM and increase strength??to improve the patient???s ability to perform ADLs and reduce pain levels  ??  10 min Manual Therapy:????PROM with inf and post [place] mobs, STM to UT and levator   Rationale:??decrease pain, increase ROM, increase tissue extensibility, decrease trigger points and increase postural awareness????to improve the patient???s ability to??perform ADLs and reduce pain levels    With   _46  TE   _47  TA   _48  neuro   _49  other: Patient Education: _50  Review HEP    _51  Progressed/Changed HEP based on:   _52  positioning   _53  body mechanics   _54  transfers   <FMBBUYZJQDUKRCVK>_1<\/MMCRFVOHKGOVPCHE>_03  heat/ice application    <TCYELYHTMBPJPETK>_2<\/OECXFQHKUVJDYNXG>_33  other:      Other Objective/Functional Measures:   FOTO:  next visit   Left??Shoulder ROM: ??AROM??????????????????????????????????????????????????PROM  ????????????????????????Flexion??????????????????????????????????????????????????164????????????????????????????????????????????????????  ????????????????????????Extension????????????????????????????????????????NT??????????????????????????????????????????????????????????????  ????????????????????????Abduction????????????????????????????????????????170 ????????????????????????????????????????????????????  ????????????????????????Adduction????????????????????????????????????????NT??????????????????????????????????????????????????????????????  ????????????????????????IR??????????????????????????????????????????????????????????????????Hand to L2 ????????????????????????????????????????????????????????????  ????????????????????????ER??????????????????????????????????????????????????????????????Hand to T1????????????  ??     Pain Level (0-10 scale) post treatment: 1/10    ASSESSMENT/Changes in Function:   Patient demonstrates near full PROM in all planes other than ER, she is still limited by pain. Will continue to show upper trap compensation patterns with scapular retraction. Will continue to progress as tolerated.  Patient will continue to benefit from skilled PT services to modify and progress therapeutic interventions, address functional mobility deficits, address ROM deficits, address strength deficits, analyze and address soft tissue restrictions, analyze and cue movement patterns, analyze and modify  body mechanics/ergonomics and assess and modify postural abnormalities to attain remaining goals.     _57   See Plan of Care  _58   See progress note/recertification  <POIPPGFQMKJIZXYO>_1<\/VWAQLRJPVGKKDPTE>_70   See Discharge Summary  Progress towards goals / Updated goals:  Short Term Goals:??To be accomplished in 6-8??treatments:  ??????????????????????????Pt will be I with HEP met intermittently   Pt will complain of pain??2-3??with all activity met (2-3/10)  Pt will increase ROM to??improve her ability to complete all basic ADL's met  Pt will be able to tolerate full treatment session to include manual??met  ??  Long Term Goals:??To be accomplished in 16-18??treatments:  ????????????????????????Pt will complain of pain??0-1??with all activity progressing towards (2-3/10)  Pt will??have full AROM to complete all ADL's and household chores without increased symptoms progressing towards  Pt will increase FOTO score by??5??points to meet MDC and improve their function    Pt will be able to complete all work tasks without increased symptoms met    PLAN  _0   Upgrade activities as tolerated     _1   Continue plan of care  _2   Update interventions per flow sheet       _3   Discharge due to:_  _4   Other:_      Hanley Ben 07/04/2018

## 2018-07-04 NOTE — Progress Notes (Signed)
 PT DAILY TREATMENT NOTE 2-15    Patient Name: Sara Cooke  Date:07/04/2018  DOB: 1970/12/05   [x]   Patient DOB Verified  Payor: [employer] [place]  / Plan: [employer] / Product Type: Commerical /    In time:738  Out time:848  Total Treatment Time (min): 70  Visit #:  18    Treatment Area: Pain in left shoulder [M25.512]    SUBJECTIVE  Pain Level (0-10 scale): 3/10  Any medication changes, allergies to medications, adverse drug reactions, diagnosis change, or new procedure performed?: [x]  No    []  Yes (see summary sheet for update)  Subjective functional status/changes:   []  No changes reported  Patient reports the popping continues, she feels like motion has improved but it is still challenging.     OBJECTIVE           Modality rationale: decrease inflammation and decrease painto improve the patient's ability to perform ADLs and reduce pain levels    Min Type Additional Details      [] ??????Estim: [] ??????Att [] ??????Unatt [] ??????TENS instruct  [] ??????IFC [] ??????Premod [] ??????NMES   [] ??????Other:  [] ??????w/US  [] ??????w/ice [] ??????w/heat  Position:  Location:      [] ??????Traction: [] ??????Cervical [] ??????Lumbar  [] ??????Prone [] ??????Supine  [] ??????Intermittent [] ??????Continuous Lbs:  [] ??????before manual  [] ??????after manual  [] ??????w/heat    [] ??????Ultrasound: [] ??????Continuous [] ??????Pulsed  at:[] ?????? [] ?????? Location:  W/cm2:    [] ??????Paraffin    Location:   [] ??????w/heat    [] ??????Ice [] ??????Heat  [] ??????Ice massage Position:  Location:    [] ??????Laser  [] ??????Other: Position:  Location:     15 [x] ??????Vasopneumatic Device Pressure: [] ??????lo [x] ??????med [] ??????hi   Temperature:34     [x] ??????Skin assessment post-treatment: [x] ??????intact [] ??????redness-  no adverse reaction  [] ??????redness -adverse reaction:        45 min Therapeutic Exercise: [x] ??????See flow sheet :   Rationale:increase ROM and increase strengthto improve the patient's ability to perform ADLs and reduce pain levels    10 min Manual Therapy:PROM with inf and post GH mobs, STM to UT and levator   Rationale:decrease pain, increase ROM, increase tissue extensibility, decrease trigger points and increase postural awarenessto improve the patient's ability toperform ADLs and reduce pain levels    With   []  TE   []  TA   []  neuro   []  other: Patient Education: [x]  Review HEP    []  Progressed/Changed HEP based on:   []  positioning   []  body mechanics   []  transfers   []  heat/ice application    []  other:      Other Objective/Functional Measures:   FOTO:  next visit   LeftShoulder ROM: AROMPROM  [id number]  ExtensionNT  Abduction170   AdductionNT  IRHand to L2   ERHand to T1       Pain Level (0-10 scale) post treatment: 1/10    ASSESSMENT/Changes in Function:   Patient demonstrates near full PROM in all planes other than ER, she is still limited by pain. Will continue to show upper trap compensation patterns with scapular retraction. Will continue to progress as tolerated.  Patient will continue to benefit from skilled PT services to modify and progress therapeutic interventions, address functional mobility deficits, address ROM deficits, address strength deficits, analyze and address soft tissue restrictions, analyze and cue movement patterns, analyze and modify body mechanics/ergonomics and  assess and modify postural abnormalities to attain remaining goals.     [x]   See Plan of Care  []   See progress note/recertification  []   See Discharge Summary  Progress towards goals / Updated goals:  Short Term Goals:To be accomplished in 6-8treatments:  Pt will be I with HEP met intermittently   Pt will complain of pain2-3with all activity met (2-3/10)  Pt will increase ROM toimprove her ability to complete all basic ADL's met  Pt will be able to tolerate full treatment session to include manualmet    Long Term Goals:To be accomplished in 16-18treatments:  Pt will complain of pain0-1with all activity progressing towards (2-3/10)  Pt willhave full AROM to complete all ADL's and household chores without increased symptoms progressing towards  Pt will increase FOTO score by5points to meet MDC and improve their function    Pt will be able to complete all work tasks without increased symptoms met    PLAN  [x]   Upgrade activities as tolerated     [x]   Continue plan of care  [x]   Update interventions per flow sheet       []   Discharge due to:_  []   Other:_      Morene T Isom 07/04/2018

## 2018-07-05 ENCOUNTER — Other Ambulatory Visit: Payer: Self-pay | Admitting: Physician Assistant

## 2018-07-05 DIAGNOSIS — E039 Hypothyroidism, unspecified: Secondary | ICD-10-CM

## 2018-07-07 ENCOUNTER — Inpatient Hospital Stay
Admit: 2018-07-07 | Payer: PRIVATE HEALTH INSURANCE | Attending: Rehabilitative and Restorative Service Providers" | Primary: Family Medicine

## 2018-07-07 NOTE — Progress Notes (Signed)
PT DAILY TREATMENT NOTE 2-15    Patient Name: Sara Cooke  Date:07/07/2018  DOB: 21-May-1970   [x]   Patient DOB Verified  Payor: [place] / Plan: [employer] [place] / Product Type: Commerical /    In time:706  Out time:812  Total Treatment Time (min): 7  Visit #:  19    Treatment Area: Pain in left shoulder [M25.512]    SUBJECTIVE  Pain Level (0-10 scale): 2/10  Any medication changes, allergies to medications, adverse drug reactions, diagnosis change, or new procedure performed?: [x]  No    []  Yes (see summary sheet for update)  Subjective functional status/changes:   []  No changes reported  Pt states that the MD wants her to continue for 2 more months     OBJECTIVE           Modality rationale: decrease inflammation and decrease pain??to improve the patient???s ability to perform ADLs and reduce pain levels ??   Min Type Additional Details   ??  ?? [] ????????Estim: [] ??????Att ????[] ??????Unatt ??????[] ??????TENS instruct  ????????????????????????????????[] ??????IFC ??[] ??????Premod ????[] ??????NMES ??  ??????????????????????????????????[] ??????Other:  [] ??????w/US ????[] ??????w/ice ????[] ??????w/heat  Position:  Location:   ??  ?? [] ??????????Traction: [] ????????Cervical ????????????[] ??????Lumbar  ??????????????????????????????????????????[] ????????Prone ??????????????????[] ??????Supine  ??????????????????????????????????????????[] ??????Intermittent ????[] ??????Continuous Lbs:  [] ????????before manual  [] ????????after manual  [] ??????w/heat   ?? [] ??????????Ultrasound: [] ??????Continuous ????[] ????????Pulsed??  ????????????????????????????????????????at:??[] ??????1MHz ????[] ??????3MHz Location:  W/cm2:   ?? [] ????????Paraffin   ?? Location:   [] ??????w/heat   ?? [] ??????????Ice ????????[] ??????????Heat  [] ??????????Ice massage Position:  Location:   ?? [] ??????????Laser  [] ??????????Other: Position:  Location:   ??   [x] ??????????Vasopneumatic Device Pressure: ????????????[] ????????lo [x] ????????med [] ????????hi   Temperature:??34   ??  [x] ????????Skin assessment post-treatment: ??[x] ??????intact [] ??????redness- no adverse reaction   ????[] ??????redness ?????adverse reaction:??  ??  ??  ??  50 min Therapeutic Exercise: ??[x] ????????See flow sheet :   Rationale:??increase ROM and increase strength??to improve the patient???s ability to perform ADLs and reduce pain levels  ??  16 min Manual Therapy:????PROM with inf and post [place] mobs, STM to UT and levator   Rationale:??decrease pain, increase ROM, increase tissue extensibility, decrease trigger points and increase postural awareness????to improve the patient???s ability to??perform ADLs and reduce pain levels    With   []  TE   []  TA   []  neuro   []  other: Patient Education: [x]  Review HEP    []  Progressed/Changed HEP based on:   []  positioning   []  body mechanics   []  transfers   []  heat/ice application    []  other:      Other Objective/Functional Measures:   FOTO:   ????????  ??     Pain Level (0-10 scale) post treatment: 1/10    ASSESSMENT/Changes in Function:   Pt is progressing well and demonstrates improving ROM.  Will continue to progress as able  Patient will continue to benefit from skilled PT services to modify and progress therapeutic interventions, address functional mobility deficits, address ROM deficits, address strength deficits, analyze and address soft tissue restrictions, analyze and cue movement patterns, analyze and modify body mechanics/ergonomics and assess and modify postural abnormalities to attain remaining goals.     [x]   See Plan of Care  []   See progress note/recertification  []   See Discharge Summary         Progress towards goals / Updated goals:  Short Term Goals:??To be accomplished in 6-8??treatments:  ??????????????????????????Pt will be I with HEP met intermittently   Pt will complain of pain??2-3??with all activity met (2-3/10)  Pt will increase  ROM to??improve her ability to complete all basic ADL's met  Pt will be able to tolerate full treatment session to include manual??met  ??  Long Term Goals:??To be accomplished in 16-18??treatments:  ????????????????????????Pt will complain of pain??0-1??with all activity progressing  towards (2-3/10)  Pt will??have full AROM to complete all ADL's and household chores without increased symptoms progressing towards  Pt will increase FOTO score by??5??points to meet MDC and improve their function    Pt will be able to complete all work tasks without increased symptoms met    PLAN  [x]   Upgrade activities as tolerated     [x]   Continue plan of care  [x]   Update interventions per flow sheet       []   Discharge due to:_  []   Other:_      Avants Singer, PT 07/07/2018

## 2018-07-07 NOTE — Progress Notes (Signed)
 PT DAILY TREATMENT NOTE 2-15    Patient Name: Sara Cooke  Date:07/07/2018  DOB: 04-22-1971   [x]   Patient DOB Verified  Payor: [employer] [place]  / Plan: [employer] / Product Type: Commerical /    In time:706  Out time:812  Total Treatment Time (min): 66  Visit #:  19    Treatment Area: Pain in left shoulder [M25.512]    SUBJECTIVE  Pain Level (0-10 scale): 2/10  Any medication changes, allergies to medications, adverse drug reactions, diagnosis change, or new procedure performed?: [x]  No    []  Yes (see summary sheet for update)  Subjective functional status/changes:   []  No changes reported  Pt states that the MD wants her to continue for 2 more months     OBJECTIVE           Modality rationale: decrease inflammation and decrease painto improve the patient's ability to perform ADLs and reduce pain levels    Min Type Additional Details      [] ??????Estim: [] ??????Att [] ??????Unatt [] ??????TENS instruct  [] ??????IFC [] ??????Premod [] ??????NMES   [] ??????Other:  [] ??????w/US  [] ??????w/ice [] ??????w/heat  Position:  Location:      [] ??????Traction: [] ??????Cervical [] ??????Lumbar  [] ??????Prone [] ??????Supine  [] ??????Intermittent [] ??????Continuous Lbs:  [] ??????before manual  [] ??????after manual  [] ??????w/heat    [] ??????Ultrasound: [] ??????Continuous [] ??????Pulsed  at:[] ?????? [] ?????? Location:  W/cm2:    [] ??????Paraffin    Location:   [] ??????w/heat    [] ??????Ice [] ??????Heat  [] ??????Ice massage Position:  Location:    [] ??????Laser  [] ??????Other: Position:  Location:      [x] ??????Vasopneumatic Device Pressure: [] ??????lo [x] ??????med [] ??????hi   Temperature:34     [x] ??????Skin assessment post-treatment: [x] ??????intact [] ??????redness- no adverse reaction  [] ??????redness  -adverse reaction:        50 min Therapeutic Exercise: [x] ??????See flow sheet :   Rationale:increase ROM and increase strengthto improve the patient's ability to perform ADLs and reduce pain levels    16 min Manual Therapy:PROM with inf and post GH mobs, STM to UT and levator   Rationale:decrease pain, increase ROM, increase tissue extensibility, decrease trigger points and increase postural awarenessto improve the patient's ability toperform ADLs and reduce pain levels    With   []  TE   []  TA   []  neuro   []  other: Patient Education: [x]  Review HEP    []  Progressed/Changed HEP based on:   []  positioning   []  body mechanics   []  transfers   []  heat/ice application    []  other:      Other Objective/Functional Measures:   FOTO:          Pain Level (0-10 scale) post treatment: 1/10    ASSESSMENT/Changes in Function:   Pt is progressing well and demonstrates improving ROM.  Will continue to progress as able  Patient will continue to benefit from skilled PT services to modify and progress therapeutic interventions, address functional mobility deficits, address ROM deficits, address strength deficits, analyze and address soft tissue restrictions, analyze and cue movement patterns, analyze and modify body mechanics/ergonomics and assess and modify postural abnormalities to attain remaining goals.     [x]   See Plan of Care  []   See progress note/recertification  []   See Discharge Summary         Progress towards goals / Updated goals:  Short Term Goals:To be accomplished in 6-8treatments:  Pt will be I with HEP met intermittently   Pt will complain of pain2-3with all activity met (2-3/10)  Pt will increase  ROM toimprove her ability to complete all basic ADL's met  Pt will be able to tolerate full treatment session to include manualmet    Long Term Goals:To be accomplished in 16-18treatments:  Pt will complain of pain0-1with all activity progressing towards  (2-3/10)  Pt willhave full AROM to complete all ADL's and household chores without increased symptoms progressing towards  Pt will increase FOTO score by5points to meet MDC and improve their function    Pt will be able to complete all work tasks without increased symptoms met    PLAN  [x]   Upgrade activities as tolerated     [x]   Continue plan of care  [x]   Update interventions per flow sheet       []   Discharge due to:_  []   Other:_      Alm [name], PT 07/07/2018

## 2018-07-08 DIAGNOSIS — G4733 Obstructive sleep apnea (adult) (pediatric): Secondary | ICD-10-CM | POA: Diagnosis not present

## 2018-07-11 ENCOUNTER — Inpatient Hospital Stay: Admit: 2018-07-11 | Payer: PRIVATE HEALTH INSURANCE | Primary: Family Medicine

## 2018-07-11 ENCOUNTER — Ambulatory Visit: Payer: BLUE CROSS/BLUE SHIELD | Admitting: Physician Assistant

## 2018-07-11 NOTE — Progress Notes (Signed)
PT DAILY TREATMENT NOTE 2-15    Patient Name: Sara Cooke  Date:07/11/2018  DOB: 04/01/1971  [x]   Patient DOB Verified  Payor: [employer] [place] / Plan: [employer] / Product Type: Commerical /    In time:743  Out time:940  Total Treatment Time (min): 57  Visit #:  20    Treatment Area: Pain in left shoulder [M25.512]    SUBJECTIVE  Pain Level (0-10 scale): 4  Any medication changes, allergies to medications, adverse drug reactions, diagnosis change, or new procedure performed?: [x]  No    []  Yes (see summary sheet for update)  Subjective functional status/changes:   []  No changes reported  Patient continues to admit she is not doing her exercises but understands she will not get better without it.     OBJECTIVE   ?? ?? ??    Modality rationale: decrease inflammation and decrease pain??to improve the patient???s ability to perform ADLs and reduce pain levels ??   Min Type Additional Details   ??  ?? [] ?????????Estim: [] ???????Att ????[] ???????Unatt ??????[] ???????TENS instruct  ????????????????????????????????[] ???????IFC ??[] ???????Premod ????[] ???????NMES ??  ??????????????????????????????????[] ???????Other:  [] ???????w/US ????[] ???????w/ice ????[] ???????w/heat  Position:  Location:   ??  ?? [] ???????????Traction: [] ?????????Cervical ????????????[] ???????Lumbar  ??????????????????????????????????????????[] ?????????Prone ??????????????????[] ???????Supine  ??????????????????????????????????????????[] ???????Intermittent ????[] ???????Continuous Lbs:  [] ?????????before manual  [] ?????????after manual  [] ???????w/heat   ?? [] ???????????Ultrasound: [] ???????Continuous ????[] ?????????Pulsed??  ????????????????????????????????????????at:??[] ??????? ????[] ??????? Location:  W/cm2:   ?? [] ?????????Paraffin   ?? Location:   [] ???????w/heat   ?? [] ???????????Ice ????????[] ???????????Heat  [] ???????????Ice massage Position:  Location:   ?? [] ???????????Laser  [] ???????????Other: Position:  Location:   ??  15 [x] ???????????Vasopneumatic Device Pressure: ????????????[] ?????????lo [x] ?????????med [] ?????????hi   Temperature:??34   ??   [x] ?????????Skin assessment post-treatment: ??[x] ???????intact [] ???????redness- no adverse reaction  ????[] ???????redness ?????adverse reaction:??  ??  ??  ??  32 min Therapeutic Exercise: ??[x] ?????????See flow sheet :   Rationale:??increase ROM and increase strength??to improve the patient???s ability to perform ADLs and reduce pain levels  ??  10 min Manual Therapy:????PROM with inf and post GH mobs, STM to UT and levator   Rationale:??decrease pain, increase ROM, increase tissue extensibility, decrease trigger points and increase postural awareness????to improve the patient???s ability to??perform ADLs and reduce pain levels  ??      With   []  TE   []  TA   []  neuro   []  other: Patient Education: [x]  Review HEP    []  Progressed/Changed HEP based on:   []  positioning   []  body mechanics   []  transfers   []  heat/ice application    []  other:      Other Objective/Functional Measures: none noted    Pain Level (0-10 scale) post treatment: 0/10    ASSESSMENT/Changes in Function:   Patient continues to require heavy cues in order to properly perform therex. Continues to have near full PROM in all planes. Poor GHJ stability with perturbations. Will continue to progress as tolerated.   Patient will continue to benefit from skilled PT services to modify and progress therapeutic interventions, address functional mobility deficits, address ROM deficits, address strength deficits, analyze and address soft tissue restrictions, analyze and cue movement patterns, analyze and modify body mechanics/ergonomics and assess and modify postural abnormalities to attain remaining goals.     [x]   See Plan of Care  []   See progress note/recertification  []   See Discharge Summary         Progress towards goals / Updated goals:  Patient is progressing towards goals.     PLAN  [x]   Upgrade activities as tolerated     [x]   Continue plan of care  [x]   Update interventions per flow sheet       []   Discharge due to:_  []   Other:_      Sharlet Salina T Atiyah Bauer 07/11/2018

## 2018-07-11 NOTE — Progress Notes (Signed)
 PT DAILY TREATMENT NOTE 2-15    Patient Name: Sara Cooke  Date:07/11/2018  DOB: [birth year]  [x]   Patient DOB Verified  Payor: [employer] [place]  / Plan: [employer] / Product Type: Commerical /    In time:743  Out time:940  Total Treatment Time (min): 57  Visit #:  20    Treatment Area: Pain in left shoulder [M25.512]    SUBJECTIVE  Pain Level (0-10 scale): 4  Any medication changes, allergies to medications, adverse drug reactions, diagnosis change, or new procedure performed?: [x]  No    []  Yes (see summary sheet for update)  Subjective functional status/changes:   []  No changes reported  Patient continues to admit she is not doing her exercises but understands she will not get better without it.     OBJECTIVE         Modality rationale: decrease inflammation and decrease painto improve the patient's ability to perform ADLs and reduce pain levels    Min Type Additional Details      [] ???????Estim: [] ???????Att [] ???????Unatt [] ???????TENS instruct  [] ???????IFC [] ???????Premod [] ???????NMES   [] ???????Other:  [] ???????w/US  [] ???????w/ice [] ???????w/heat  Position:  Location:      [] ???????Traction: [] ???????Cervical [] ???????Lumbar  [] ???????Prone [] ???????Supine  [] ???????Intermittent [] ???????Continuous Lbs:  [] ???????before manual  [] ???????after manual  [] ???????w/heat    [] ???????Ultrasound: [] ???????Continuous [] ???????Pulsed  at:[] ??????? [] ??????? Location:  W/cm2:    [] ???????Paraffin    Location:   [] ???????w/heat    [] ???????Ice [] ???????Heat  [] ???????Ice massage Position:  Location:    [] ???????Laser  [] ???????Other: Position:  Location:     15 [x] ???????Vasopneumatic Device Pressure: [] ???????lo [x] ???????med [] ???????hi   Temperature:34     [x] ???????Skin assessment  post-treatment: [x] ???????intact [] ???????redness- no adverse reaction  [] ???????redness -adverse reaction:        32 min Therapeutic Exercise: [x] ???????See flow sheet :   Rationale:increase ROM and increase strengthto improve the patient's ability to perform ADLs and reduce pain levels    10 min Manual Therapy:PROM with inf and post GH mobs, STM to UT and levator   Rationale:decrease pain, increase ROM, increase tissue extensibility, decrease trigger points and increase postural awarenessto improve the patient's ability toperform ADLs and reduce pain levels        With   []  TE   []  TA   []  neuro   []  other: Patient Education: [x]  Review HEP    []  Progressed/Changed HEP based on:   []  positioning   []  body mechanics   []  transfers   []  heat/ice application    []  other:      Other Objective/Functional Measures: none noted    Pain Level (0-10 scale) post treatment: 0/10    ASSESSMENT/Changes in Function:   Patient continues to require heavy cues in order to properly perform therex. Continues to have near full PROM in all planes. Poor GHJ stability with perturbations. Will continue to progress as tolerated.   Patient will continue to benefit from skilled PT services to modify and progress therapeutic interventions, address functional mobility deficits, address ROM deficits, address strength deficits, analyze and address soft tissue restrictions, analyze and cue movement patterns, analyze and modify body mechanics/ergonomics and assess and modify postural abnormalities to attain remaining goals.     [x]   See Plan of Care  []   See progress note/recertification  []   See Discharge Summary         Progress towards goals / Updated goals:  Patient is progressing towards goals.     PLAN  [x]   Upgrade activities as tolerated     [x]   Continue plan of care  [x]   Update interventions per flow sheet       []   Discharge due to:_  []   Other:_      [name] Shoulder 07/11/2018

## 2018-07-12 DIAGNOSIS — Z96651 Presence of right artificial knee joint: Secondary | ICD-10-CM | POA: Diagnosis not present

## 2018-07-13 ENCOUNTER — Encounter
Payer: PRIVATE HEALTH INSURANCE | Attending: Rehabilitative and Restorative Service Providers" | Primary: Family Medicine

## 2018-07-14 ENCOUNTER — Ambulatory Visit: Payer: Self-pay | Admitting: Nurse Practitioner

## 2018-07-18 ENCOUNTER — Inpatient Hospital Stay: Admit: 2018-07-18 | Payer: PRIVATE HEALTH INSURANCE | Primary: Family Medicine

## 2018-07-18 DIAGNOSIS — M25512 Pain in left shoulder: Secondary | ICD-10-CM

## 2018-07-18 NOTE — Progress Notes (Signed)
PT DAILY TREATMENT NOTE 2-15    Patient Name: Sara Cooke  Date:07/18/2018  DOB: [birth year]  [x]   Patient DOB Verified  Payor: [employer] [place] / Plan: [employer] / Product Type: Commerical /    In time:740  Out time:846  Total Treatment Time (min): 66  Visit #:  21    Treatment Area: Pain in left shoulder [M25.512]    SUBJECTIVE  Pain Level (0-10 scale): 2/10  Any medication changes, allergies to medications, adverse drug reactions, diagnosis change, or new procedure performed?: [x]  No    []  Yes (see summary sheet for update)  Subjective functional status/changes:   []  No changes reported  Patient reports she had been working much more diligent with her HEP.     OBJECTIVE     ?? ?? ??    Modality rationale: decrease inflammation and decrease pain??to improve the patient???s ability to perform ADLs and reduce pain levels ??   Min Type Additional Details   ??  ?? [] ??????????Estim: [] ????????Att ????[] ????????Unatt ??????[] ????????TENS instruct  ????????????????????????????????[] ????????IFC ??[] ????????Premod ????[] ????????NMES ??  ??????????????????????????????????[] ????????Other:  [] ????????w/US ????[] ????????w/ice ????[] ????????w/heat  Position:  Location:   ??  ?? [] ????????????Traction: [] ??????????Cervical ????????????[] ????????Lumbar  ??????????????????????????????????????????[] ??????????Prone ??????????????????[] ????????Supine  ??????????????????????????????????????????[] ????????Intermittent ????[] ????????Continuous Lbs:  [] ??????????before manual  [] ??????????after manual  [] ????????w/heat   ?? [] ????????????Ultrasound: [] ????????Continuous ????[] ??????????Pulsed??  ????????????????????????????????????????at:??[] ???????? ????[] ???????? Location:  W/cm2:   ?? [] ??????????Paraffin   ?? Location:   [] ????????w/heat   ?? [] ????????????Ice ????????[] ????????????Heat  [] ????????????Ice massage Position:  Location:   ?? [] ????????????Laser  [] ????????????Other: Position:  Location:   ??  15 [x] ????????????Vasopneumatic Device Pressure: ????????????[] ??????????lo [x] ??????????med [] ??????????hi   Temperature:??34   ??   [x] ??????????Skin assessment post-treatment: ??[x] ????????intact [] ????????redness- no adverse reaction  ????[] ????????redness ?????adverse reaction:??  ??  ??  ??  41 min Therapeutic Exercise: ??[x] ??????????See flow sheet :   Rationale:??increase ROM and increase strength??to improve the patient???s ability to perform ADLs and reduce pain levels  ??  10 min Manual Therapy:????PROM with inf and post GH mobs, STM to UT and levator   Rationale:??decrease pain, increase ROM, increase tissue extensibility, decrease trigger points and increase postural awareness????to improve the patient???s ability to??perform ADLs and reduce pain levels  ??                                           With   []  TE   []  TA   []  neuro   []  other: Patient Education: [x]  Review HEP    []  Progressed/Changed HEP based on:   []  positioning   []  body mechanics   []  transfers   []  heat/ice application    []  other:      Other Objective/Functional Measures: none noted     Pain Level (0-10 scale) post treatment: 1/10    ASSESSMENT/Changes in Function:   Patient continues to struggle with stability based therex. Shows full ROM in all planes, but will experince discomfort in ER and flexion. Will continue to progress as tolerated.  Patient will continue to benefit from skilled PT services to modify and progress therapeutic interventions, address functional mobility deficits, address ROM deficits, address strength deficits, analyze and address soft tissue restrictions, analyze and cue movement patterns, analyze and modify body mechanics/ergonomics and assess and modify postural abnormalities to attain remaining goals.     [x]   See Plan of Care  []   See progress note/recertification  []   See Discharge Summary  Progress towards goals / Updated goals:  Patient is progressing towards goals.      PLAN  [x]   Upgrade activities as tolerated     [x]   Continue plan of care  [x]   Update interventions per flow sheet       []   Discharge due to:_  []   Other:_       Sharlet Salina T Keairra Bardon 07/18/2018

## 2018-07-18 NOTE — Progress Notes (Signed)
 PT DAILY TREATMENT NOTE 2-15    Patient Name: Sara Cooke  Date:07/18/2018  DOB: 04/14/1971  [x]   Patient DOB Verified  Payor: [employer]   / Plan: [employer] / Product Type: Commerical /    In time:740  Out time:846  Total Treatment Time (min): 66  Visit #:  21    Treatment Area: Pain in left shoulder [M25.512]    SUBJECTIVE  Pain Level (0-10 scale): 2/10  Any medication changes, allergies to medications, adverse drug reactions, diagnosis change, or new procedure performed?: [x]  No    []  Yes (see summary sheet for update)  Subjective functional status/changes:   []  No changes reported  Patient reports she had been working much more diligent with her HEP.     OBJECTIVE           Modality rationale: decrease inflammation and decrease painto improve the patient's ability to perform ADLs and reduce pain levels    Min Type Additional Details      [] ????????Estim: [] ????????Att [] ????????Unatt [] ????????TENS instruct  [] ????????IFC [] ????????Premod [] ????????NMES   [] ????????Other:  [] ????????w/US  [] ????????w/ice [] ????????w/heat  Position:  Location:      [] ????????Traction: [] ????????Cervical [] ????????Lumbar  [] ????????Prone [] ????????Supine  [] ????????Intermittent [] ????????Continuous Lbs:  [] ????????before manual  [] ????????after manual  [] ????????w/heat    [] ????????Ultrasound: [] ????????Continuous [] ????????Pulsed  at:[] ???????? [] ???????? Location:  W/cm2:    [] ????????Paraffin    Location:   [] ????????w/heat    [] ????????Ice [] ????????Heat  [] ????????Ice massage Position:  Location:    [] ????????Laser  [] ????????Other: Position:  Location:     15 [x] ????????Vasopneumatic Device Pressure: [] ????????lo [x] ????????med [] ????????hi   Temperature:34     [x] ????????Skin assessment  post-treatment: [x] ????????intact [] ????????redness- no adverse reaction  [] ????????redness -adverse reaction:        41 min Therapeutic Exercise: [x] ????????See flow sheet :   Rationale:increase ROM and increase strengthto improve the patient's ability to perform ADLs and reduce pain levels    10 min Manual Therapy:PROM with inf and post GH mobs, STM to UT and levator   Rationale:decrease pain, increase ROM, increase tissue extensibility, decrease trigger points and increase postural awarenessto improve the patient's ability toperform ADLs and reduce pain levels                                             With   []  TE   []  TA   []  neuro   []  other: Patient Education: [x]  Review HEP    []  Progressed/Changed HEP based on:   []  positioning   []  body mechanics   []  transfers   []  heat/ice application    []  other:      Other Objective/Functional Measures: none noted     Pain Level (0-10 scale) post treatment: 1/10    ASSESSMENT/Changes in Function:   Patient continues to struggle with stability based therex. Shows full ROM in all planes, but will experince discomfort in ER and flexion. Will continue to progress as tolerated.  Patient will continue to benefit from skilled PT services to modify and progress therapeutic interventions, address functional mobility deficits, address ROM deficits, address strength deficits, analyze and address soft tissue restrictions, analyze and cue movement patterns, analyze and modify body mechanics/ergonomics and assess and modify postural abnormalities to attain remaining goals.     [x]   See Plan of Care  []   See progress note/recertification  []   See Discharge Summary  Progress towards goals / Updated goals:  Patient is progressing towards goals.      PLAN  [x]   Upgrade activities as tolerated     [x]   Continue plan of care  [x]   Update interventions per flow sheet       []   Discharge due to:_  []   Other:_      [name] Shoulder 07/18/2018

## 2018-07-20 ENCOUNTER — Inpatient Hospital Stay: Admit: 2018-07-20 | Payer: PRIVATE HEALTH INSURANCE | Primary: Family Medicine

## 2018-07-20 ENCOUNTER — Encounter
Payer: PRIVATE HEALTH INSURANCE | Attending: Rehabilitative and Restorative Service Providers" | Primary: Family Medicine

## 2018-07-20 NOTE — Progress Notes (Signed)
PT DAILY TREATMENT NOTE 2-15    Patient Name: NHIA MCNICHOL  Date:07/20/2018  DOB: 1971/02/22  [x]   Patient DOB Verified  Payor: [employer] [place] / Plan: [employer] / Product Type: Commerical /    In time:202  Out time:303  Total Treatment Time (min): 61  Visit #:  22    Treatment Area: Pain in left shoulder [M25.512]    SUBJECTIVE  Pain Level (0-10 scale): 2/10  Any medication changes, allergies to medications, adverse drug reactions, diagnosis change, or new procedure performed?: [x]  No    []  Yes (see summary sheet for update)  Subjective functional status/changes:   []  No changes reported  Patient reports no major changes since last visit.     OBJECTIVE      ?? ?? ?? ??   Modality rationale: decrease inflammation and decrease pain??to improve the patient???s ability to perform ADLs and reduce pain levels ??   Min Type Additional Details   ??  ?? [] ???????????Estim: [] ?????????Att ????[] ?????????Unatt ??????[] ?????????TENS instruct  ????????????????????????????????[] ?????????IFC ??[] ?????????Premod ????[] ?????????NMES ??  ??????????????????????????????????[] ?????????Other:  [] ?????????w/US ????[] ?????????w/ice ????[] ?????????w/heat  Position:  Location:   ??  ?? [] ?????????????Traction: [] ???????????Cervical ????????????[] ?????????Lumbar  ??????????????????????????????????????????[] ???????????Prone ??????????????????[] ?????????Supine  ??????????????????????????????????????????[] ?????????Intermittent ????[] ?????????Continuous Lbs:  [] ???????????before manual  [] ???????????after manual  [] ?????????w/heat   ?? [] ?????????????Ultrasound: [] ?????????Continuous ????[] ???????????Pulsed??  ????????????????????????????????????????at:??[] ????????? ????[] ????????? Location:  W/cm2:   ?? [] ???????????Paraffin   ?? Location:   [] ?????????w/heat   ?? [] ?????????????Ice ????????[] ?????????????Heat  [] ?????????????Ice massage Position:  Location:   ?? [] ?????????????Laser  [] ?????????????Other: Position:  Location:   ??  15 [x] ?????????????Vasopneumatic Device Pressure: ????????????[] ???????????lo [x] ???????????med [] ???????????hi   Temperature:??34   ??   [x] ???????????Skin assessment post-treatment: ??[x] ?????????intact [] ?????????redness- no adverse reaction  ????[] ?????????redness ?????adverse reaction:??  ??  ??  ??  36 min Therapeutic Exercise: ??[x] ???????????See flow sheet :   Rationale:??increase ROM and increase strength??to improve the patient???s ability to perform ADLs and reduce pain levels  ??  10 min Manual Therapy:????PROM with inf and post GH mobs, STM to UT and levator   Rationale:??decrease pain, increase ROM, increase tissue extensibility, decrease trigger points and increase postural awareness????to improve the patient???s ability to??perform ADLs and reduce pain levels  ??????????      With   []  TE   []  TA   []  neuro   []  other: Patient Education: [x]  Review HEP    []  Progressed/Changed HEP based on:   []  positioning   []  body mechanics   []  transfers   []  heat/ice application    []  other:      Other Objective/Functional Measures: none noted     Pain Level (0-10 scale) post treatment: 3/10    ASSESSMENT/Changes in Function:   Patient tolerated all therex, will continue to progress as tolerated.  Patient will continue to benefit from skilled PT services to modify and progress therapeutic interventions, address functional mobility deficits, address ROM deficits, address strength deficits, analyze and address soft tissue restrictions, analyze and cue movement patterns, analyze and modify body mechanics/ergonomics and assess and modify postural abnormalities to attain remaining goals.     [x]   See Plan of Care  []   See progress note/recertification  []   See Discharge Summary         Progress towards goals / Updated goals:  Patient is progressing towards goals.     PLAN  [x]   Upgrade activities as tolerated     [x]   Continue plan of care  [x]   Update interventions per flow sheet       []   Discharge due to:_  []   Other:_      Glynis Smiles 07/20/2018

## 2018-07-20 NOTE — Progress Notes (Signed)
 PT DAILY TREATMENT NOTE 2-15    Patient Name: Sara Cooke  Date:07/20/2018  DOB: [birth year]  [x]   Patient DOB Verified  Payor: [employer] [place]  / Plan: [employer] / Product Type: Commerical /    In time:202  Out time:303  Total Treatment Time (min): 61  Visit #:  22    Treatment Area: Pain in left shoulder [M25.512]    SUBJECTIVE  Pain Level (0-10 scale): 2/10  Any medication changes, allergies to medications, adverse drug reactions, diagnosis change, or new procedure performed?: [x]  No    []  Yes (see summary sheet for update)  Subjective functional status/changes:   []  No changes reported  Patient reports no major changes since last visit.     OBJECTIVE            Modality rationale: decrease inflammation and decrease painto improve the patient's ability to perform ADLs and reduce pain levels    Min Type Additional Details      [] ?????????Estim: [] ?????????Att [] ?????????Unatt [] ?????????TENS instruct  [] ?????????IFC [] ?????????Premod [] ?????????NMES   [] ?????????Other:  [] ?????????w/US  [] ?????????w/ice [] ?????????w/heat  Position:  Location:      [] ?????????Traction: [] ?????????Cervical [] ?????????Lumbar  [] ?????????Prone [] ?????????Supine  [] ?????????Intermittent [] ?????????Continuous Lbs:  [] ?????????before manual  [] ?????????after manual  [] ?????????w/heat    [] ?????????Ultrasound: [] ?????????Continuous [] ?????????Pulsed  at:[] ????????? [] ????????? Location:  W/cm2:    [] ?????????Paraffin    Location:   [] ?????????w/heat    [] ?????????Ice [] ?????????Heat  [] ?????????Ice massage Position:  Location:    [] ?????????Laser  [] ?????????Other: Position:  Location:     15 [x] ?????????Vasopneumatic Device Pressure: [] ?????????lo [x] ?????????med [] ?????????hi   Temperature:34      [x] ?????????Skin assessment post-treatment: [x] ?????????intact [] ?????????redness- no adverse reaction  [] ?????????redness -adverse reaction:        36 min Therapeutic Exercise: [x] ?????????See flow sheet :   Rationale:increase ROM and increase strengthto improve the patient's ability to perform ADLs and reduce pain levels    10 min Manual Therapy:PROM with inf and post GH mobs, STM to UT and levator   Rationale:decrease pain, increase ROM, increase tissue extensibility, decrease trigger points and increase postural awarenessto improve the patient's ability toperform ADLs and reduce pain levels        With   []  TE   []  TA   []  neuro   []  other: Patient Education: [x]  Review HEP    []  Progressed/Changed HEP based on:   []  positioning   []  body mechanics   []  transfers   []  heat/ice application    []  other:      Other Objective/Functional Measures: none noted     Pain Level (0-10 scale) post treatment: 3/10    ASSESSMENT/Changes in Function:   Patient tolerated all therex, will continue to progress as tolerated.  Patient will continue to benefit from skilled PT services to modify and progress therapeutic interventions, address functional mobility deficits, address ROM deficits, address strength deficits, analyze and address soft tissue restrictions, analyze and cue movement patterns, analyze and modify body mechanics/ergonomics and assess and modify postural abnormalities to attain remaining goals.     [x]   See Plan of Care  []   See progress note/recertification  []   See Discharge Summary         Progress towards goals / Updated goals:  Patient is progressing towards goals.     PLAN  [x]   Upgrade activities as tolerated     [x]   Continue plan of care  [x]   Update interventions per flow sheet       []   Discharge due to:_  []   Other:_      Sara Cooke Shoulder 07/20/2018

## 2018-07-25 ENCOUNTER — Inpatient Hospital Stay: Admit: 2018-07-25 | Payer: PRIVATE HEALTH INSURANCE | Primary: Family Medicine

## 2018-07-25 NOTE — Progress Notes (Signed)
PT DAILY TREATMENT NOTE 2-15    Patient Name: Sara Cooke  Date:07/25/2018  DOB: 1971-05-09  [x]   Patient DOB Verified  Payor: [employer] [place] / Plan: [employer] / Product Type: Com[employer]ical /    In time:738  Out time:849  Total Treatment Time (min): 71  Visit #:  23    Treatment Area: Pain in left shoulder [M25.512]    SUBJECTIVE  Pain Level (0-10 scale): 3/10  Any medication changes, allergies to medications, adverse drug reactions, diagnosis change, or new procedure performed?: [x]  No    []  Yes (see summary sheet for update)  Subjective functional status/changes:   []  No changes reported  Patient reports she was a bit sore after last visit.     OBJECTIVE    ?? ?? ?? ?? ??   Modality rationale: decrease inflammation and decrease pain??to improve the patient???s ability to perform ADLs and reduce pain levels ??   Min Type Additional Details   ??  ?? [] ????????????Estim: [] ??????????Att ????[] ??????????Unatt ??????[] ??????????TENS instruct  ????????????????????????????????[] ??????????IFC ??[] ??????????Premod ????[] ??????????NMES ??  ??????????????????????????????????[] ??????????Other:  [] ??????????w/US ????[] ??????????w/ice ????[] ??????????w/heat  Position:  Location:   ??  ?? [] ??????????????Traction: [] ????????????Cervical ????????????[] ??????????Lumbar  ??????????????????????????????????????????[] ????????????Prone ??????????????????[] ??????????Supine  ??????????????????????????????????????????[] ??????????Intermittent ????[] ??????????Continuous Lbs:  [] ????????????before manual  [] ????????????after manual  [] ??????????w/heat   ?? [] ??????????????Ultrasound: [] ??????????Continuous ????[] ????????????Pulsed??  ????????????????????????????????????????at:??[] ?????????? ????[] ?????????? Location:  W/cm2:   ?? [] ????????????Paraffin   ?? Location:   [] ??????????w/heat   ?? [] ??????????????Ice ????????[] ??????????????Heat  [] ??????????????Ice massage Position:  Location:   ?? [] ??????????????Laser  [] ??????????????Other: Position:  Location:   ??  15 [x] ??????????????Vasopneumatic Device Pressure: ????????????[] ????????????lo  [x] ????????????med [] ????????????hi   Temperature:??34   ??  [x] ????????????Skin assessment post-treatment: ??[x] ??????????intact [] ??????????redness- no adverse reaction  ????[] ??????????redness ?????adverse reaction:??  ??  ??  ??  46 min Therapeutic Exercise: ??[x] ????????????See flow sheet :   Rationale:??increase ROM and increase strength??to improve the patient???s ability to perform ADLs and reduce pain levels  ??  10 min Manual Therapy:????PROM with inf and post GH mobs, STM to UT and levator   Rationale:??decrease pain, increase ROM, increase tissue extensibility, decrease trigger points and increase postural awareness????to improve the patient???s ability to??perform ADLs and reduce pain levels  ??????????                                  With   []  TE   []  TA   []  neuro   []  other: Patient Education: [x]  Review HEP    []  Progressed/Changed HEP based on:   []  positioning   []  body mechanics   []  transfers   []  heat/ice application    []  other:      Other Objective/Functional Measures: none noted     Pain Level (0-10 scale) post treatment: 2/10    ASSESSMENT/Changes in Function:   Patient demonstrates full ROM in flexion, IR, and abduction, but still has minor limitations in ER. Continues to require heavy cues to limit compensation patterns. Tolerated all therex, will continue to progress as tolerated.  Patient will continue to benefit from skilled PT services to modify and progress therapeutic interventions, address functional mobility deficits, address ROM deficits, address strength deficits, analyze and address soft tissue restrictions, analyze and cue movement patterns, analyze and modify body mechanics/ergonomics and assess and modify postural abnormalities to attain remaining goals.     [x]   See Plan of Care  []   See progress note/recertification  []   See Discharge Summary  Progress towards goals / Updated goals:  Patient is progressing towards goals.    PLAN  [x]   Upgrade activities as tolerated     [x]   Continue plan of care   [x]   Update interventions per flow sheet       []   Discharge due to:_  []   Other:_      Glynis Smiles 07/25/2018

## 2018-07-25 NOTE — Progress Notes (Signed)
PT DAILY TREATMENT NOTE 2-15    Patient Name: Sara Cooke  Date:07/25/2018  DOB: 09-15-1970  [x]   Patient DOB Verified  Payor: [employer] [place] / Plan: [employer] / Product Type: Commerical /    In time:738  Out time:849  Total Treatment Time (min): 71  Visit #:  23    Treatment Area: Pain in left shoulder [M25.512]    SUBJECTIVE  Pain Level (0-10 scale): 3/10  Any medication changes, allergies to medications, adverse drug reactions, diagnosis change, or new procedure performed?: [x]  No    []  Yes (see summary sheet for update)  Subjective functional status/changes:   []  No changes reported  Patient reports she was a bit sore after last visit.     OBJECTIVE           Modality rationale: decrease inflammation and decrease painto improve the patient's ability to perform ADLs and reduce pain levels    Min Type Additional Details      [] ??????????Estim: [] ??????????Att [] ??????????Unatt [] ??????????TENS instruct  [] ??????????IFC [] ??????????Premod [] ??????????NMES   [] ??????????Other:  [] ??????????w/US [] ??????????w/ice [] ??????????w/heat  Position:  Location:      [] ??????????Traction: [] ??????????Cervical [] ??????????Lumbar  [] ??????????Prone [] ??????????Supine  [] ??????????Intermittent [] ??????????Continuous Lbs:  [] ??????????before manual  [] ??????????after manual  [] ??????????w/heat    [] ??????????Ultrasound: [] ??????????Continuous [] ??????????Pulsed  at:[] ?????????? [] ?????????? Location:  W/cm2:    [] ??????????Paraffin    Location:   [] ??????????w/heat    [] ??????????Ice [] ??????????Heat  [] ??????????Ice massage Position:  Location:    [] ??????????Laser  [] ??????????Other: Position:  Location:     15 [x] ??????????Vasopneumatic Device Pressure: [] ??????????lo [x] ??????????med  [] ??????????hi   Temperature:34     [x] ??????????Skin assessment post-treatment: [x] ??????????intact [] ??????????redness- no adverse reaction  [] ??????????redness -adverse reaction:        46 min Therapeutic Exercise: [x] ??????????See flow sheet :   Rationale:increase ROM and increase strengthto improve the patient's ability to perform ADLs and reduce pain levels    10 min Manual Therapy:PROM with inf and post GH mobs, STM to UT and levator   Rationale:decrease pain, increase ROM, increase tissue extensibility, decrease trigger points and increase postural awarenessto improve the patient's ability toperform ADLs and reduce pain levels                                    With   []  TE   []  TA   []  neuro   []  other: Patient Education: [x]  Review HEP    []  Progressed/Changed HEP based on:   []  positioning   []  body mechanics   []  transfers   []  heat/ice application    []  other:      Other Objective/Functional Measures: none noted     Pain Level (0-10 scale) post treatment: 2/10    ASSESSMENT/Changes in Function:   Patient demonstrates full ROM in flexion, IR, and abduction, but still has minor limitations in ER. Continues to require heavy cues to limit compensation patterns. Tolerated all therex, will continue to progress as tolerated.  Patient will continue to benefit from skilled PT services to modify and progress therapeutic interventions, address functional mobility deficits, address ROM deficits, address strength deficits, analyze and address soft tissue restrictions, analyze and cue movement patterns, analyze and modify body mechanics/ergonomics and assess and modify postural abnormalities to attain remaining goals.     [x]   See Plan of Care  []   See progress note/recertification  []   See Discharge Summary  Progress towards goals / Updated goals:  Patient is progressing towards goals.    PLAN  [x]   Upgrade activities as tolerated     [x]   Continue plan of care  [x]   Update  interventions per flow sheet       []   Discharge due to:_  []   Other:_      Glynis Smiles 07/25/2018

## 2018-07-28 ENCOUNTER — Inpatient Hospital Stay
Admit: 2018-07-28 | Payer: PRIVATE HEALTH INSURANCE | Attending: Rehabilitative and Restorative Service Providers" | Primary: Family Medicine

## 2018-07-28 NOTE — Progress Notes (Signed)
Telephonic outreach to patient who verified her DOB and zip code.  Patient consents to care management at this time.   Patient went to ED on 06/17/2018 with migraine vomiting. Patient stated that she was started on blood pressure medication at that time.   Patient has a open work comp case after tripping and dislocating her left shoulder and breaking her arm in October of 2019.   Patient stated that she does have a caseworker and is currently attempting to go through her paperwork to identify bills that need to be paid. Patient will also reach out to her caseworker to determine cost and next steps.   Patient does have an appointment with her surgeon on 3/18. Patient grateful she did not require surgery.   Currently in physical therapy.  This CM will reach out to patient late next week to complete CM assessment and discuss needs.

## 2018-07-28 NOTE — Progress Notes (Signed)
Telephonic outreach to patient who verified her DOB and zip code.  Patient consents to care management at this time.   Patient went to ED on 06/17/2018 with migraine vomiting. Patient stated that she was started on blood pressure medication at that time.   Patient has a open work comp case after tripping and dislocating her left shoulder and breaking her arm in October of 2019.   Patient stated that she does have a caseworker and is currently attempting to go through her paperwork to identify bills that need to be paid. Patient will also reach out to her caseworker to determine cost and next steps.   Patient does have an appointment with her surgeon on 3/18. Patient grateful she did not require surgery.   Currently in physical therapy.  This CM will reach out to patient late next week to complete CM assessment and discuss needs.

## 2018-07-28 NOTE — Discharge Instructions (Signed)
[facility]  [address]  [place], [place] [address]  Phone: [phone] Fax: [phone]    Discharge Summary  2-15    Patient name: Sara Cooke  DOB: [birth year]  Provider#: [id number]  Referral source: Katrinka Blazing, Julious(Jody) P,*      Medical/Treatment Diagnosis: Pain in left shoulder [M25.512]     Prior Hospitalization: see medical history     Comorbidities: See Plan of Care  Prior Level of Function:See Plan of Care  Medications: Verified on Patient Summary List    Start of Care: 04/27/18      Onset Date:03/14/18   Visits from Start of Care: 28     Missed Visits: 0  Reporting Period : 04/27/18 to 09/28/18      ASSESSMENT/SUMMARY OF CARE: Pt completed 28 visits of therapy that included both in person and telehealth visits secodnary to the pandemic.  She was able to progress well and was nearing discharge when she stopped attending her appointments.  Pt is discharged today, 06/06/2019, as they have stopped attending therapy.  Final objective data and outcomes were unable to be obtained.          RECOMMENDATIONS:  [] Discontinue therapy: [x] Patient has reached or is progressing toward set goals      [] Patient is non-compliant or has abdicated      [] Due to lack of appreciable progress towards set goals      [x] Other Covid-19    [name], PT 06/06/2019

## 2018-07-28 NOTE — Progress Notes (Signed)
PT DAILY TREATMENT NOTE 2-15    Patient Name: Sara Cooke  Date:07/28/2018  DOB: 12/05/1970  [x]   Patient DOB Verified  Payor: [employer]  / Plan: [employer] / Product Type: Commerical /    In time:731  Out time:833  Total Treatment Time (min): 62  Visit #:  24    Treatment Area: Pain in left shoulder [M25.512]    SUBJECTIVE  Pain Level (0-10 scale): 3/10  Any medication changes, allergies to medications, adverse drug reactions, diagnosis change, or new procedure performed?: [x]  No    []  Yes (see summary sheet for update)  Subjective functional status/changes:   []  No changes reported  Patient reports she was a bit sore after last visit.     OBJECTIVE    ?? ?? ?? ?? ??   Modality rationale: decrease inflammation and decrease pain??to improve the patient???s ability to perform ADLs and reduce pain levels ??   Min Type Additional Details   ??  ?? [] ????????????Estim: [] ??????????Att ????[] ??????????Unatt ??????[] ??????????TENS instruct  ????????????????????????????????[] ??????????IFC ??[] ??????????Premod ????[] ??????????NMES ??  ??????????????????????????????????[] ??????????Other:  [] ??????????w/US ????[] ??????????w/ice ????[] ??????????w/heat  Position:  Location:   ??  ?? [] ??????????????Traction: [] ????????????Cervical ????????????[] ??????????Lumbar  ??????????????????????????????????????????[] ????????????Prone ??????????????????[] ??????????Supine  ??????????????????????????????????????????[] ??????????Intermittent ????[] ??????????Continuous Lbs:  [] ????????????before manual  [] ????????????after manual  [] ??????????w/heat   ?? [] ??????????????Ultrasound: [] ??????????Continuous ????[] ????????????Pulsed??  ????????????????????????????????????????at:??[] ?????????? ????[] ?????????? Location:  W/cm2:   ?? [] ????????????Paraffin   ?? Location:   [] ??????????w/heat   ?? [] ??????????????Ice ????????[] ??????????????Heat  [] ??????????????Ice massage Position:  Location:   ?? [] ??????????????Laser  [] ??????????????Other: Position:  Location:   ??    [x] ??????????????Vasopneumatic Device Pressure: ????????????[] ????????????lo [x] ????????????med [] ????????????hi   Temperature:??34   ??  [x] ????????????Skin assessment post-treatment: ??[x] ??????????intact [] ??????????redness- no adverse reaction  ????[] ??????????redness ?????adverse reaction:??  ??  ??  ??  48 min Therapeutic Exercise: ??[x] ????????????See flow sheet :   Rationale:??increase ROM and increase strength??to improve the patient???s ability to perform ADLs and reduce pain levels  ??  14 min Manual Therapy:????PROM with inf and post GH mobs, STM to UT and levator   Rationale:??decrease pain, increase ROM, increase tissue extensibility, decrease trigger points and increase postural awareness????to improve the patient???s ability to??perform ADLs and reduce pain levels  ??????????                                  With   []  TE   []  TA   []  neuro   []  other: Patient Education: [x]  Review HEP    []  Progressed/Changed HEP based on:   []  positioning   []  body mechanics   []  transfers   []  heat/ice application    []  other:      Other Objective/Functional Measures: none noted     Pain Level (0-10 scale) post treatment: 2/10    ASSESSMENT/Changes in Function:   Patient demonstrates full ROM in flexion, IR, and abduction, but still has minor limitations in ER. Continues to require heavy cues to limit compensation patterns. Tolerated all therex, will continue to progress as tolerated.  Patient will continue to benefit from skilled PT services to modify and progress therapeutic interventions, address functional mobility deficits, address ROM deficits, address strength deficits, analyze and address soft tissue restrictions, analyze and cue movement patterns, analyze and modify body mechanics/ergonomics and assess and modify postural abnormalities to attain remaining goals.     [x]   See Plan of Care  []   See progress note/recertification  []   See Discharge Summary  Progress towards goals / Updated goals:  Patient is progressing towards goals.    PLAN   [x]   Upgrade activities as tolerated     [x]   Continue plan of care  [x]   Update interventions per flow sheet       []   Discharge due to:_  []   Other:_      [name], PT 07/28/2018

## 2018-07-28 NOTE — Progress Notes (Signed)
PT DAILY TREATMENT NOTE 2-15    Patient Name: Sara Cooke  Date:07/28/2018  DOB: April 04, 1971  [x]   Patient DOB Verified  Payor: [employer] [place] / Plan: [employer] / Product Type: Commerical /    In time:731  Out time:833  Total Treatment Time (min): 62  Visit #:  24    Treatment Area: Pain in left shoulder [M25.512]    SUBJECTIVE  Pain Level (0-10 scale): 3/10  Any medication changes, allergies to medications, adverse drug reactions, diagnosis change, or new procedure performed?: [x]  No    []  Yes (see summary sheet for update)  Subjective functional status/changes:   []  No changes reported  Patient reports she was a bit sore after last visit.     OBJECTIVE           Modality rationale: decrease inflammation and decrease painto improve the patient's ability to perform ADLs and reduce pain levels    Min Type Additional Details      [] ??????????Estim: [] ??????????Att [] ??????????Unatt [] ??????????TENS instruct  [] ??????????IFC [] ??????????Premod [] ??????????NMES   [] ??????????Other:  [] ??????????w/US [] ??????????w/ice [] ??????????w/heat  Position:  Location:      [] ??????????Traction: [] ??????????Cervical [] ??????????Lumbar  [] ??????????Prone [] ??????????Supine  [] ??????????Intermittent [] ??????????Continuous Lbs:  [] ??????????before manual  [] ??????????after manual  [] ??????????w/heat    [] ??????????Ultrasound: [] ??????????Continuous [] ??????????Pulsed  at:[] ?????????? [] ?????????? Location:  W/cm2:    [] ??????????Paraffin    Location:   [] ??????????w/heat    [] ??????????Ice [] ??????????Heat  [] ??????????Ice massage Position:  Location:    [] ??????????Laser  [] ??????????Other: Position:  Location:      [x] ??????????Vasopneumatic Device Pressure: [] ??????????lo [x] ??????????med [] ??????????hi    Temperature:34     [x] ??????????Skin assessment post-treatment: [x] ??????????intact [] ??????????redness- no adverse reaction  [] ??????????redness -adverse reaction:        48 min Therapeutic Exercise: [x] ??????????See flow sheet :   Rationale:increase ROM and increase strengthto improve the patient's ability to perform ADLs and reduce pain levels    14 min Manual Therapy:PROM with inf and post GH mobs, STM to UT and levator   Rationale:decrease pain, increase ROM, increase tissue extensibility, decrease trigger points and increase postural awarenessto improve the patient's ability toperform ADLs and reduce pain levels                                    With   []  TE   []  TA   []  neuro   []  other: Patient Education: [x]  Review HEP    []  Progressed/Changed HEP based on:   []  positioning   []  body mechanics   []  transfers   []  heat/ice application    []  other:      Other Objective/Functional Measures: none noted     Pain Level (0-10 scale) post treatment: 2/10    ASSESSMENT/Changes in Function:   Patient demonstrates full ROM in flexion, IR, and abduction, but still has minor limitations in ER. Continues to require heavy cues to limit compensation patterns. Tolerated all therex, will continue to progress as tolerated.  Patient will continue to benefit from skilled PT services to modify and progress therapeutic interventions, address functional mobility deficits, address ROM deficits, address strength deficits, analyze and address soft tissue restrictions, analyze and cue movement patterns, analyze and modify body mechanics/ergonomics and assess and modify postural abnormalities to attain remaining goals.     [x]   See Plan of Care  []   See progress note/recertification  []   See Discharge Summary  Progress towards goals / Updated goals:  Patient is progressing towards goals.    PLAN  [x]   Upgrade activities as tolerated     [x]   Continue plan of care  [x]   Update interventions per flow  sheet       []   Discharge due to:_  []   Other:_      [name], PT 07/28/2018

## 2018-08-01 ENCOUNTER — Inpatient Hospital Stay: Admit: 2018-08-01 | Payer: PRIVATE HEALTH INSURANCE | Primary: Family Medicine

## 2018-08-01 ENCOUNTER — Ambulatory Visit: Payer: BLUE CROSS/BLUE SHIELD | Admitting: Physician Assistant

## 2018-08-01 NOTE — Progress Notes (Signed)
PT DAILY TREATMENT NOTE 2-15    Patient Name: Sara Cooke  Date:08/01/2018  DOB: [birth year]  [x]   Patient DOB Verified  Payor: [place] / Plan: [employer] [place] / Product Type: Commerical /    In time:730  Out time:842  Total Treatment Time (min): 72  Visit #:  25      Treatment Area: Pain in left shoulder [M25.512]    SUBJECTIVE  Pain Level (0-10 scale): 3/10  Any medication changes, allergies to medications, adverse drug reactions, diagnosis change, or new procedure performed?: [x]  No    []  Yes (see summary sheet for update)  Subjective functional status/changes:   []  No changes reported  Patient believes she is 90-95% better.  No longer takes medication.      OBJECTIVE   ?? ?? ?? ??   Modality rationale: decrease inflammation and decrease pain??to improve the patient???s ability to perform ADLs and reduce pain levels ??   Min Type Additional Details   ??  ?? [] ?????????????Estim: [] ???????????Att ????[] ???????????Unatt ??????[] ???????????TENS instruct  ????????????????????????????????[] ???????????IFC ??[] ???????????Premod ????[] ???????????NMES ??  ??????????????????????????????????[] ???????????Other:  [] ???????????w/US ????[] ???????????w/ice ????[] ???????????w/heat  Position:  Location:   ??  ?? [] ???????????????Traction: [] ?????????????Cervical ????????????[] ???????????Lumbar  ??????????????????????????????????????????[] ?????????????Prone ??????????????????[] ???????????Supine  ??????????????????????????????????????????[] ???????????Intermittent ????[] ???????????Continuous Lbs:  [] ?????????????before manual  [] ?????????????after manual  [] ???????????w/heat   ?? [] ???????????????Ultrasound: [] ???????????Continuous ????[] ?????????????Pulsed??  ????????????????????????????????????????at:??[] ???????????1MHz ????[] ???????????3MHz Location:  W/cm2:   ?? [] ?????????????Paraffin   ?? Location:   [] ???????????w/heat   ?? [] ???????????????Ice ????????[] ???????????????Heat  [] ???????????????Ice massage Position:  Location:   ?? [] ???????????????Laser  [] ???????????????Other: Position:  Location:   ??   15 [x] ???????????????Vasopneumatic Device Pressure: ????????????[] ?????????????lo [x] ?????????????med [] ?????????????hi   Temperature:??34   ??  [x] ?????????????Skin assessment post-treatment: ??[x] ???????????intact [] ???????????redness- no adverse reaction  ????[] ???????????redness ?????adverse reaction:??  ??  ??  ??  47 min Therapeutic Exercise: ??[x] ?????????????See flow sheet :   Rationale:??increase ROM and increase strength??to improve the patient???s ability to perform ADLs and reduce pain levels  ??  10 min Manual Therapy:????PROM with inf and post [place] mobs, STM to UT and levator   Rationale:??decrease pain, increase ROM, increase tissue extensibility, decrease trigger points and increase postural awareness????to improve the patient???s ability to??perform ADLs and reduce pain levels  ??????????????????????????????????????????????????????????????????????????  With   []  TE   []  TA   []  neuro   []  other: Patient Education: [x]  Review HEP    []  Progressed/Changed HEP based on:   []  positioning   []  body mechanics   []  transfers   []  heat/ice application    []  other:      Other Objective/Functional Measures:    FOTO:????70  ??Left??Shoulder ROM: ??AROM??????????????????????????????????????????????????PROM  ????????????????????????Flexion??????????????????????????????????????????????????174????????????????????????????????????????????????????  ????????????????????????Extension????????????????????????????????????????NT??????????????????????????????????????????????????????????????  ????????????????????????Abduction????????????????????????????????????????178 ????????????????????????????????????????????????????  ????????????????????????Adduction????????????????????????????????????????NT??????????????????????????????????????????????????????????????  ????????????????????????IR??????????????????????????????????????????????????????????????????Hand to??T10??????????????????????????????????????????????????????????????  ????????????????????????ER??????????????????????????????????????????????????????????????Hand to??T2????????????    Pain Level (0-10 scale) post treatment: 3/10    ASSESSMENT/Changes in Function:   Patient continues to lack slightly with ER and flexion in PROM. Improved shoulder and scapular stability. Will continue to progress as tolerated.  Patient will continue to benefit from skilled PT services to modify and  progress therapeutic interventions, address functional mobility deficits, address ROM deficits, address strength deficits, analyze and address soft tissue restrictions, analyze and cue movement patterns, analyze and modify body mechanics/ergonomics and assess and modify postural abnormalities to attain remaining goals.     []   See Plan of Care  [x]   See progress note/recertification  []   See Discharge Summary         Progress towards goals / Updated goals:  Short Term Goals:??To be accomplished in 6-8??treatments:  ??????????????????????????Pt will be  I with HEP??met intermittently   Pt will complain of pain??2-3??with all activity??met (2-3/10)  Pt will increase ROM to??improve her ability to complete all basic ADL's??met  Pt will be able to tolerate full treatment session to include manual??met  ??  Long Term Goals:??To be accomplished in 16-18??treatments:  ????????????????????????Pt will complain of pain??0-1??with all activity??progressing towards (2-3/10)  Pt will??have full AROM to complete all ADL's and household chores without increased symptoms??progressing towards  Pt will increase FOTO score by??5??points to meet MDC and improve their function??              Pt will be able to complete all work tasks without increased symptoms??met    PLAN  [x]   Upgrade activities as tolerated     [x]   Continue plan of care  [x]   Update interventions per flow sheet       []   Discharge due to:_  []   Other:_      Hanley Ben 08/01/2018

## 2018-08-01 NOTE — Progress Notes (Signed)
 PT DAILY TREATMENT NOTE 2-15    Patient Name: Sara Cooke  Date:08/01/2018  DOB: 1971/01/20  [x]   Patient DOB Verified  Payor: [employer] [place]  / Plan: [employer] / Product Type: Commerical /    In time:730  Out time:842  Total Treatment Time (min): 72  Visit #:  25      Treatment Area: Pain in left shoulder [M25.512]    SUBJECTIVE  Pain Level (0-10 scale): 3/10  Any medication changes, allergies to medications, adverse drug reactions, diagnosis change, or new procedure performed?: [x]  No    []  Yes (see summary sheet for update)  Subjective functional status/changes:   []  No changes reported  Patient believes she is 90-95% better.  No longer takes medication.      OBJECTIVE         Modality rationale: decrease inflammation and decrease painto improve the patient's ability to perform ADLs and reduce pain levels    Min Type Additional Details      [] ???????????Estim: [] ???????????Att [] ???????????Unatt [] ???????????TENS instruct  [] ???????????IFC [] ???????????Premod [] ???????????NMES   [] ???????????Other:  [] ???????????w/US  [] ???????????w/ice [] ???????????w/heat  Position:  Location:      [] ???????????Traction: [] ???????????Cervical [] ???????????Lumbar  [] ???????????Prone [] ???????????Supine  [] ???????????Intermittent [] ???????????Continuous Lbs:  [] ???????????before manual  [] ???????????after manual  [] ???????????w/heat    [] ???????????Ultrasound: [] ???????????Continuous [] ???????????Pulsed  at:[] ??????????? [] ??????????? Location:  W/cm2:    [] ???????????Paraffin    Location:   [] ???????????w/heat    [] ???????????Ice [] ???????????Heat  [] ???????????Ice massage Position:  Location:    [] ???????????Laser  [] ???????????Other: Position:  Location:     15 [x] ???????????Vasopneumatic Device Pressure:  [] ???????????lo [x] ???????????med [] ???????????hi   Temperature:34     [x] ???????????Skin assessment post-treatment: [x] ???????????intact [] ???????????redness- no adverse reaction  [] ???????????redness -adverse reaction:        47 min Therapeutic Exercise: [x] ???????????See flow sheet :   Rationale:increase ROM and increase strengthto improve the patient's ability to perform ADLs and reduce pain levels    10 min Manual Therapy:PROM with inf and post GH mobs, STM to UT and levator   Rationale:decrease pain, increase ROM, increase tissue extensibility, decrease trigger points and increase postural awarenessto improve the patient's ability toperform ADLs and reduce pain levels    With   []  TE   []  TA   []  neuro   []  other: Patient Education: [x]  Review HEP    []  Progressed/Changed HEP based on:   []  positioning   []  body mechanics   []  transfers   []  heat/ice application    []  other:      Other Objective/Functional Measures:    FOTO:70  LeftShoulder ROM: AROMPROM  Flexion174  ExtensionNT  Abduction178   AdductionNT  IRHand toT10  ERHand toT2    Pain Level (0-10 scale) post treatment: 3/10    ASSESSMENT/Changes in Function:   Patient continues to lack slightly with ER and flexion in PROM. Improved shoulder and scapular stability. Will continue to progress as tolerated.  Patient will continue to benefit from skilled PT services to modify and progress therapeutic interventions, address functional mobility deficits, address ROM deficits, address strength  deficits, analyze and address soft tissue restrictions, analyze and cue movement patterns, analyze and modify body mechanics/ergonomics and assess and modify postural abnormalities to attain remaining goals.     []   See Plan of Care  [x]   See progress note/recertification  []   See Discharge Summary         Progress towards goals / Updated goals:  Short Term Goals:To be accomplished in 6-8treatments:  Pt will be  I with HEPmet intermittently   Pt will complain of pain2-3with all activitymet (2-3/10)  Pt will increase ROM toimprove her ability to complete all basic ADL'smet  Pt will be able to tolerate full treatment session to include manualmet    Long Term Goals:To be accomplished in 16-18treatments:  Pt will complain of pain0-1with all activityprogressing towards (2-3/10)  Pt willhave full AROM to complete all ADL's and household chores without increased symptomsprogressing towards  Pt will increase FOTO score by5points to meet MDC and improve their function              Pt will be able to complete all work tasks without increased symptomsmet    PLAN  [x]   Upgrade activities as tolerated     [x]   Continue plan of care  [x]   Update interventions per flow sheet       []   Discharge due to:_  []   Other:_      Sara Cooke 08/01/2018

## 2018-08-02 ENCOUNTER — Ambulatory Visit (INDEPENDENT_AMBULATORY_CARE_PROVIDER_SITE_OTHER): Payer: BLUE CROSS/BLUE SHIELD | Admitting: Rehabilitative and Restorative Service Providers"

## 2018-08-02 ENCOUNTER — Other Ambulatory Visit: Payer: Self-pay

## 2018-08-02 ENCOUNTER — Encounter: Payer: Self-pay | Admitting: Rehabilitative and Restorative Service Providers"

## 2018-08-02 DIAGNOSIS — R600 Localized edema: Secondary | ICD-10-CM

## 2018-08-02 DIAGNOSIS — M25561 Pain in right knee: Secondary | ICD-10-CM

## 2018-08-02 DIAGNOSIS — R2689 Other abnormalities of gait and mobility: Secondary | ICD-10-CM

## 2018-08-02 DIAGNOSIS — M6281 Muscle weakness (generalized): Secondary | ICD-10-CM

## 2018-08-02 NOTE — Patient Instructions (Signed)
Access Code: [facility]  URL: [email or web address]  Date: 08/02/2018  Prepared by: Gillermo Murdoch   Exercises  Supine Hamstring Stretch with Strap - 10 reps - 1 sets - 30 seconds hold - 2x daily - 7x weekly  Supine ITB Stretch with Strap - 3 reps - 1 sets - 30 seconds hold - 2x daily - 7x weekly  Prone Quadriceps Stretch with Strap - 3 reps - 1 sets - 30 seconds hold - 2x daily - 7x weekly  Prone Quadriceps Set - 10 reps - 1 sets - 5 sec hold - 2x daily - 7x weekly  Prone Hip Extension with Plantarflexion - 10 reps - 1 sets - 5 sec hold - 2x daily - 7x weekly  Supine Quad Set - 10 reps - 1 sets - 5 sec hold - 2x daily - 7x weekly  Straight Leg Raise - 10 reps - 1 sets - 5 sec hold - 2x daily - 7x weekly  Standing Weight Shifting Forward and Backward - 20 reps - 1 sets - 2x daily - 7x weekly  Step Up - 10 reps - 1 sets - 2x daily - 7x weekly  Lateral Step Ups - 10 reps - 1 sets - 2x daily - 7x weekly  Single Leg Stance - 5 reps - 1 sets - 30 sec hold - 2x daily - 7x weekly

## 2018-08-02 NOTE — Therapy (Signed)
[place], [place], [address] Phone: [phone]   Fax:  [phone]  Physical Therapy Evaluation  Patient Details  Name: Judith [name] MRN: [id number] Date of Birth: July 01, 1970 Referring Provider (PT): Dr Levan Hurst    Encounter Date: 08/02/2018  PT End of Session - 08/02/18 1206    Visit Number  1    Number of Visits  8    Date for PT Re-Evaluation  08/30/18    PT Start Time  1101    PT Stop Time  1200    PT Time Calculation (min)  59 min    Activity Tolerance  Patient tolerated treatment well       Past Medical History:  Diagnosis Date  . Asthma   . Bipolar 1 disorder ([place])   . Depression   . GERD (gastroesophageal reflux disease)   . Hyperlipemia   . Hypothyroid   . Migraines     Past Surgical History:  Procedure Laterality Date  . ABDOMINAL SURGERY    . CARPAL TUNNEL RELEASE Bilateral 1999  . CESAREAN SECTION    . CHOLECYSTECTOMY    . THYROIDECTOMY    . TONSILLECTOMY      There were no vitals filed for this visit.   Subjective Assessment - 08/02/18 1112    Subjective  Patient reports pain in the Judith [name] knee for years with TKA 05/26/6220 with no complications.     Pertinent History  Lt TKA 01/20/18; asthma; HTN; gall bladder removed; depression; anxiety; c-section     Diagnostic tests  xrays     Patient Stated Goals  back to work     Currently in Pain?  Yes    Pain Score  3     Pain Location  Knee    Pain Orientation  Right    Pain Descriptors / Indicators  Radiating;Shooting    Pain Type  Chronic pain    Pain Radiating Towards  to side of knee     Pain Onset  More than a month ago    Pain Frequency  Constant    Aggravating Factors   sitting; standing; sit to stand; walking too much    Pain Relieving Factors  ice; elevation; meds          [facility] PT Assessment - 08/02/18 0001      Assessment   Medical Diagnosis  Judith [name] TKA    Referring Provider (PT)  Dr Levan Hurst     Onset Date/Surgical Date  06/22/18    Hand Dominance  Right    Next MD Visit  08/09/2018    Prior Therapy  here for Lt TKA       Precautions   Precautions  None      Balance Screen   Has the patient fallen in the past 6 months  No    Has the patient had a decrease in activity level because of a fear of falling?   No    Is the patient reluctant to leave their home because of a fear of falling?   No      Prior Function   Level of Independence  Independent    Vocation  Full time employment    Vocation Requirements  climbing ladder; lifting; reaching; stocking     Leisure  in gym prior to surgery       Observation/Other Assessments   Focus on Therapeutic Outcomes (FOTO)   61% limitation  Observation/Other Assessments-Edema    Edema  --   edema noted Judith [name] knee      Sensation   Additional Comments  some numbness in lateral Judith [name] knee       AROM   Right/Left Hip  --   WNL's bilat    Right Knee Extension  -9    Right Knee Flexion  120    Left Knee Extension  0    Left Knee Flexion  128    Right/Left Ankle  --   WNL's bilat      Strength   Right Hip Flexion  --   5-/5   Right Hip Extension  --   5-/5   Right Hip ABduction  --   5-/5   Right Hip ADduction  5/5    Left Hip Flexion  5/5    Left Hip Extension  5/5    Left Hip ABduction  5/5    Left Hip ADduction  5/5    Right Knee Flexion  4+/5    Right Knee Extension  4/5    Left Knee Flexion  5/5    Left Knee Extension  5/5      Flexibility   Hamstrings  tight ~ 75-80 deg bilat     Quadriceps  Judith [name] 88 deg; Lt 110 deg     ITB  mild tightness     Piriformis  mild tightness       Ambulation/Gait   Ambulation/Gait  Yes    Ambulation/Gait Assistance  7: Independent    Ambulation Distance (Feet)  80 Feet    Assistive device  None    Gait Pattern  Step-through pattern    Ambulation Surface  Level    Gait Comments  abnormal gait - limp on Judith [name] LE with wt bearing on Judith [name]                 Objective  measurements completed on examination: See above findings.      [facility] Adult PT Treatment/Exercise - 08/02/18 0001      Knee/Hip Exercises: Stretches   Passive Hamstring Stretch  Right;3 reps;30 seconds   supine with strap    Quad Stretch  Right;3 reps;30 seconds   prone with strap    ITB Stretch  Right;2 reps;30 seconds   supine with strap      Knee/Hip Exercises: Standing   Lateral Step Up  Right;10 reps;Hand Hold: 1;Hand Hold: 0;Step Height: 6"    Forward Step Up  Right;10 reps;Hand Hold: 1;Hand Hold: 0;Step Height: 6"    SLS  20 sec x 5 reps       Knee/Hip Exercises: Supine   Quad Sets  AROM;Strengthening;Right;10 reps   5 sec hold heel propped on foam roll    Straight Leg Raises  AROM;Strengthening;Right;1 set;10 reps   5 sec hold      Knee/Hip Exercises: Prone   Hip Extension  Strengthening;Right;10 reps   5 sec hold    Other Prone Exercises  quad set prone straightening knee with toe down 5 sec hold x 10       Vasopneumatic   Number Minutes Vasopneumatic   15 minutes    Vasopnuematic Location   Knee   Judith [name]    Vasopneumatic Pressure  Low    Vasopneumatic Temperature   34 deg             PT Education - 08/02/18 1155    Education Details  HEP     Person(s) Educated  Patient  Methods  Explanation;Demonstration;Tactile cues;Verbal cues;Handout    Comprehension  Verbalized understanding;Returned demonstration;Verbal cues required;Tactile cues required          PT Long Term Goals - 08/02/18 1212      PT LONG TERM GOAL #1   Title  Increase ROM Judith [name] knee to 0 deg extension - 125 deg flexion 08/30/2018    Time  4    Period  Weeks    Status  New      PT LONG TERM GOAL #2   Title  5/5 strength Judith [name] LE 08/30/2018    Time  4    Period  Weeks    Status  New      PT LONG TERM GOAL #3   Title  Patient tolerating 30 min of standing and or walking 08/30/2018    Time  4    Period  Weeks    Status  New      PT LONG TERM GOAL #4   Title  Independent in HEP  08/30/2018    Time  4    Period  Weeks      PT LONG TERM GOAL #5   Title  Improve FOTO to </= 43% limitation 08/30/2018    Baseline  -    Time  4    Period  Weeks    Status  New             Plan - 08/02/18 1207    Clinical Impression Statement  Judith [name] presents s/p Judith [name] TKA 06/22/2018 with continued pain; limited ROM; decreased strength; abnormal gait; limited functional activities and inability to perform tasks necessary for her job. Christinna will benefit from PT to address problems identified.     Examination-Activity Limitations  Squat;Stairs;Locomotion Level;Stand;Sit;Other    Examination-Participation Restrictions  Other    Clinical Decision Making  Low    Rehab Potential  Excellent    PT Frequency  2x / week    PT Duration  4 weeks    PT Treatment/Interventions  Patient/family education;ADLs/Self Care Home Management;Cryotherapy;Electrical Stimulation;Iontophoresis 4mg /ml Dexamethasone;Moist Heat;Ultrasound;Dry needling;Manual techniques;Gait training;Stair training;Functional mobility training;Therapeutic activities;Therapeutic exercise;Balance training;Neuromuscular re-education;Taping    PT Next Visit Plan  reveiw HEP; progress with stretching and strengthening; gait training; manual work and modalities as indicated     Consulted and Agree with Plan of Care  Patient       Patient will benefit from skilled therapeutic intervention in order to improve the following deficits and impairments:  Increased muscle spasms, Increased fascial restricitons, Decreased mobility, Decreased range of motion, Abnormal gait, Decreased activity tolerance, Decreased endurance, Decreased strength, Pain  Visit Diagnosis: Acute pain of right knee - Plan: PT plan of care cert/re-cert  Muscle weakness (generalized) - Plan: PT plan of care cert/re-cert  Other abnormalities of gait and mobility - Plan: PT plan of care cert/re-cert  Localized edema - Plan: PT plan of care cert/re-cert     Problem  List Patient Active Problem List   Diagnosis Date Noted  . Acute cystitis without hematuria 03/21/2018  . History of left knee replacement 02/10/2018  . Morbid obesity ([place]) 11/21/2017  . Primary osteoarthritis of left knee 09/22/2017  . Primary osteoarthritis of right knee 09/22/2017  . Attention deficit hyperactivity disorder (ADHD) 05/12/2016  . Episodic mood disorder ([place]) 05/12/2016  . Type 2 diabetes mellitus without complication, without long-term current use of insulin ([place]) 05/12/2016  . Gastroesophageal reflux disease without esophagitis 05/12/2016  . DYSPNEA 07/22/2010  . CHEST PAIN 07/22/2010  . Hypothyroidism  09/13/2007  . Hyperlipidemia 09/13/2007  . ANXIETY DEPRESSION 09/13/2007  . MIGRAINE HEADACHE 09/13/2007  . Essential hypertension 09/13/2007  . ASTHMA 09/13/2007  . CHOLELITHIASIS, HX OF 09/13/2007  . NEPHROLITHIASIS, HX OF 09/13/2007  . CARPAL TUNNEL RELEASE, HX OF 09/13/2007    Wilburta Milbourn Nilda Simmer PT, MPH  08/03/2018, 7:07 AM  [facility] [place], [place], [address] Phone: [phone]   Fax:  [phone]  Name: Judith [name] MRN: [id number] Date of Birth: 1971/03/03

## 2018-08-04 ENCOUNTER — Encounter
Payer: PRIVATE HEALTH INSURANCE | Attending: Rehabilitative and Restorative Service Providers" | Primary: Family Medicine

## 2018-08-04 ENCOUNTER — Ambulatory Visit (INDEPENDENT_AMBULATORY_CARE_PROVIDER_SITE_OTHER): Payer: BLUE CROSS/BLUE SHIELD | Admitting: Rehabilitative and Restorative Service Providers"

## 2018-08-04 ENCOUNTER — Encounter: Payer: Self-pay | Admitting: Rehabilitative and Restorative Service Providers"

## 2018-08-04 ENCOUNTER — Other Ambulatory Visit: Payer: Self-pay

## 2018-08-04 DIAGNOSIS — R600 Localized edema: Secondary | ICD-10-CM | POA: Diagnosis not present

## 2018-08-04 DIAGNOSIS — M25561 Pain in right knee: Secondary | ICD-10-CM | POA: Diagnosis not present

## 2018-08-04 DIAGNOSIS — M6281 Muscle weakness (generalized): Secondary | ICD-10-CM | POA: Diagnosis not present

## 2018-08-04 DIAGNOSIS — R2689 Other abnormalities of gait and mobility: Secondary | ICD-10-CM

## 2018-08-04 NOTE — Therapy (Signed)
[place], [place], [address] Phone: [phone]   Fax:  [phone]  Physical Therapy Treatment  Patient Details  Name: Judith [name] MRN: [id number] Date of Birth: Jan 24, 1971 Referring Provider (PT): Dr Levan Hurst    Encounter Date: 08/04/2018  PT End of Session - 08/04/18 1109    Visit Number  2    Number of Visits  8    Date for PT Re-Evaluation  08/30/18    PT Start Time  1108    PT Stop Time  1200    PT Time Calculation (min)  52 min    Activity Tolerance  Patient tolerated treatment well       Past Medical History:  Diagnosis Date  . Asthma   . Bipolar 1 disorder ([place])   . Depression   . GERD (gastroesophageal reflux disease)   . Hyperlipemia   . Hypothyroid   . Migraines     Past Surgical History:  Procedure Laterality Date  . ABDOMINAL SURGERY    . CARPAL TUNNEL RELEASE Bilateral 1999  . CESAREAN SECTION    . CHOLECYSTECTOMY    . THYROIDECTOMY    . TONSILLECTOMY      There were no vitals filed for this visit.  Subjective Assessment - 08/04/18 1109    Subjective  Increased pain following evaluation and initial treatment - sore in the outside knee area.     Currently in Pain?  Yes    Pain Score  6     Pain Location  Knee    Pain Orientation  Right    Pain Descriptors / Indicators  Radiating;Shooting    Pain Type  Chronic pain                       [facility] Adult PT Treatment/Exercise - 08/04/18 0001      Knee/Hip Exercises: Aerobic   Nustep  L5 x 6 min LE/UE - 9       Knee/Hip Exercises: Standing   Heel Raises  Both   25 reps    Lateral Step Up  Right;10 reps;Hand Hold: 1;Hand Hold: 0;Step Height: 6"    Forward Step Up  Right;10 reps;Hand Hold: 1;Hand Hold: 0;Step Height: 6"    SLS  30 sec x 3 reps each LE no UE support       Knee/Hip Exercises: Supine   Quad Sets  AROM;Strengthening;Right;10 reps   5 sec hold heel propped on foam roll    Heel Slides  AROM;Right;10 reps    Straight Leg Raises  AROM;Strengthening;Right;1 set;10 reps   5 sec hold      Knee/Hip Exercises: Prone   Hip Extension  Strengthening;Right;10 reps   5 sec hold    Other Prone Exercises  quad set prone straightening knee with toe down 5 sec hold x 10       Vasopneumatic   Number Minutes Vasopneumatic   15 minutes    Vasopnuematic Location   Knee   Rt    Vasopneumatic Pressure  Low    Vasopneumatic Temperature   34 deg      Manual Therapy   Manual therapy comments  pt supine hooklying     Soft tissue mobilization  soft tissue work along the lateral distal thigh through the ITB     Kinesiotex  Inhibit Muscle;Edema      Kinesiotix   Edema  scar management with zigzag kineso tape  Inhibit Muscle   long strip lateral thigh across the knee to proximal fibula with 2 small T strips one above and one below knee              PT Education - 08/04/18 1155    Education Details  HEP     Person(s) Educated  Patient    Methods  Explanation    Comprehension  Verbalized understanding          PT Long Term Goals - 08/02/18 1212      PT LONG TERM GOAL #1   Title  Increase ROM Rt knee to 0 deg extension - 125 deg flexion 08/30/2018    Time  4    Period  Weeks    Status  New      PT LONG TERM GOAL #2   Title  5/5 strength Rt LE 08/30/2018    Time  4    Period  Weeks    Status  New      PT LONG TERM GOAL #3   Title  Patient tolerating 30 min of standing and or walking 08/30/2018    Time  4    Period  Weeks    Status  New      PT LONG TERM GOAL #4   Title  Independent in HEP 08/30/2018    Time  4    Period  Weeks      PT LONG TERM GOAL #5   Title  Improve FOTO to </= 43% limitation 08/30/2018    Baseline  -    Time  4    Period  Weeks    Status  New            Plan - 08/04/18 1109    Clinical Impression Statement  Some increased pain in the lateral Rt knee since initial treatment. note muscular tightness through the distal  lateral thigh in the ITB and hamstrings. Responded well to manual work and taping.  No new exercises today. Continue with current HEP     Examination-Activity Limitations  Squat;Stairs;Locomotion Level;Stand;Sit;Other    Examination-Participation Restrictions  Other    Rehab Potential  Excellent    PT Frequency  2x / week    PT Duration  4 weeks    PT Treatment/Interventions  Patient/family education;ADLs/Self Care Home Management;Cryotherapy;Electrical Stimulation;Iontophoresis 4mg /ml Dexamethasone;Moist Heat;Ultrasound;Dry needling;Manual techniques;Gait training;Stair training;Functional mobility training;Therapeutic activities;Therapeutic exercise;Balance training;Neuromuscular re-education;Taping    PT Next Visit Plan  reveiw HEP; progress with stretching and strengthening; gait training; manual work and modalities as indicated Assess response to taping     Consulted and Agree with Plan of Care  Patient       Patient will benefit from skilled therapeutic intervention in order to improve the following deficits and impairments:  Increased muscle spasms, Increased fascial restricitons, Decreased mobility, Decreased range of motion, Abnormal gait, Decreased activity tolerance, Decreased endurance, Decreased strength, Pain  Visit Diagnosis: Acute pain of right knee  Muscle weakness (generalized)  Other abnormalities of gait and mobility  Localized edema     Problem List Patient Active Problem List   Diagnosis Date Noted  . Acute cystitis without hematuria 03/21/2018  . History of left knee replacement 02/10/2018  . Morbid obesity ([place]) 11/21/2017  . Primary osteoarthritis of left knee 09/22/2017  . Primary osteoarthritis of right knee 09/22/2017  . Attention deficit hyperactivity disorder (ADHD) 05/12/2016  . Episodic mood disorder ([place]) 05/12/2016  . Type 2 diabetes mellitus without complication, without long-term current use  of insulin ([place]) 05/12/2016  . Gastroesophageal  reflux disease without esophagitis 05/12/2016  . DYSPNEA 07/22/2010  . CHEST PAIN 07/22/2010  . Hypothyroidism 09/13/2007  . Hyperlipidemia 09/13/2007  . ANXIETY DEPRESSION 09/13/2007  . MIGRAINE HEADACHE 09/13/2007  . Essential hypertension 09/13/2007  . ASTHMA 09/13/2007  . CHOLELITHIASIS, HX OF 09/13/2007  . NEPHROLITHIASIS, HX OF 09/13/2007  . CARPAL TUNNEL RELEASE, HX OF 09/13/2007    Carlis Blanchard Nilda Simmer PT, MPH  08/04/2018, 12:05 PM  [facility] [place], [place], [address] Phone: [phone]   Fax:  [phone]  Name: NATASHIA ROSEMAN MRN: [id number] Date of Birth: Apr 03, 1971

## 2018-08-04 NOTE — Patient Instructions (Signed)
Kinesiology tape ?What is kinesiology tape?  ?There are many brands of kinesiology tape.  KTape, Rock Tape, Body Sport, Dynamic tape, to name a few. It is an elasticized tape designed to support the body?s natural healing process. This tape provides stability and support to muscles and joints without restricting motion. It can also help decrease swelling in the area of application. ?How does it work? ?The tape microscopically lifts and decompresses the skin to allow for drainage of lymph (swelling) to flow away from area, reducing inflammation.  The tape has the ability to help re-educate the neuromuscular system by targeting specific receptors in the skin.  The presence of the tape increases the body?s awareness of posture and body mechanics.  ?Do not use with: ?Open wounds ?Skin lesions ?Adhesive allergies ?Safe removal of the tape: ?In some rare cases, mild/moderate skin irritation can occur.  This can include redness, itchiness, or hives. If this occurs, immediately remove tape and consult your primary care physician if symptoms are severe or do not resolve within 2 days.  ?To remove tape safely, hold nearby skin with one hand and gentle roll tape down with other hand.  You can apply oil or conditioner to tape while in shower prior to removal to loosen adhesive.  ?DO NOT swiftly rip tape off like a band-aid, as this could cause skin tears and additional skin irritation.  ? ?

## 2018-08-05 ENCOUNTER — Telehealth: Payer: Self-pay | Admitting: Physical Therapy

## 2018-08-05 NOTE — Telephone Encounter (Signed)
Called patient to inform her that [facility] will be closed until 4/6 due to COVID-19. Informed her the scheduled visits over next 2 wks have been cancelled. She stated that she has a good HEP in place and will continue this until her next visit. Scheduled visits for her after 4/6, and confirmed new appts with her.    Kerin Perna, PTA 08/05/18 12:48 PM

## 2018-08-08 ENCOUNTER — Encounter: Payer: PRIVATE HEALTH INSURANCE | Primary: Family Medicine

## 2018-08-08 ENCOUNTER — Encounter: Payer: BLUE CROSS/BLUE SHIELD | Admitting: Physical Therapy

## 2018-08-09 ENCOUNTER — Other Ambulatory Visit: Payer: Self-pay | Admitting: Physician Assistant

## 2018-08-09 NOTE — Telephone Encounter (Signed)
Patient aware that she would have to be seen to discuss and she is also advised that we must have records from psychiatrist for [facility] to review before appointment

## 2018-08-10 ENCOUNTER — Encounter: Payer: BLUE CROSS/BLUE SHIELD | Admitting: Physical Therapy

## 2018-08-11 ENCOUNTER — Encounter
Payer: PRIVATE HEALTH INSURANCE | Attending: Rehabilitative and Restorative Service Providers" | Primary: Family Medicine

## 2018-08-12 ENCOUNTER — Telehealth: Payer: Self-pay | Admitting: Physician Assistant

## 2018-08-12 NOTE — Telephone Encounter (Signed)
No I do not. 

## 2018-08-12 NOTE — Telephone Encounter (Signed)
Patient aware that [name] does not due ADHD testing.

## 2018-08-15 ENCOUNTER — Encounter: Payer: PRIVATE HEALTH INSURANCE | Primary: Family Medicine

## 2018-08-15 ENCOUNTER — Encounter: Payer: BLUE CROSS/BLUE SHIELD | Admitting: Physical Therapy

## 2018-08-17 ENCOUNTER — Inpatient Hospital Stay
Admit: 2018-08-17 | Discharge: 2018-08-17 | Disposition: A | Discharge status: Home / Self Care | Payer: PRIVATE HEALTH INSURANCE | Attending: Emergency Medicine

## 2018-08-17 ENCOUNTER — Emergency Department: Admit: 2018-08-17 | Payer: PRIVATE HEALTH INSURANCE | Primary: Family Medicine

## 2018-08-17 DIAGNOSIS — R05 Cough: Secondary | ICD-10-CM

## 2018-08-17 LAB — STREP AG SCREEN, GROUP A
Group A Strep Ag ID: NEGATIVE
Strep A Ag: NEGATIVE

## 2018-08-17 NOTE — ED Notes (Signed)
Discharge instructions given to patient by PA/NP and RN. Pt has been given counseling regarding at home treatment plan. Pt verbalizes understanding of need to seek further treatment if symptoms worsen. Pt ambulated off of unit in no signs of distress.
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
## 2018-08-17 NOTE — ED Triage Notes (Addendum)
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  My dad is in the ICU, I'm having shortness of breath and chest pain.  I need to get tested".  Patient reports father tested positive for COVID.
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
## 2018-08-17 NOTE — ED Notes (Signed)
Discharge instructions given to patient by PA/NP and RN. Pt has been given counseling regarding at home treatment plan. Pt verbalizes understanding of need to seek further treatment if symptoms worsen. Pt ambulated off of unit in no signs of distress.

## 2018-08-17 NOTE — ED Notes (Signed)
 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  My dad is in the ICU, I'm having shortness of breath and chest pain.  I need to get tested.  Patient reports father tested positive for COVID.

## 2018-08-18 ENCOUNTER — Encounter: Payer: BLUE CROSS/BLUE SHIELD | Admitting: Physical Therapy

## 2018-08-18 NOTE — Progress Notes (Signed)
Patient is currently enrolled in care management services.   Recent ED visit for COVID Testing    COVID-19 Screening Initial Follow-up Note    Patient contacted regarding COVID-19 exposure.  Care Transition Nurse/ Ambulatory Care Manager contacted the patient by telephone to perform post discharge assessment. Verified name and DOB with patient as identifiers. Provided introduction to self, and explanation of the CTN/ACM role, and reason for call due to risk factors for infection and/or exposure to COVID-19.   Patient reports that her father was tested for COVID and is currently hospitalized. Her mother is also experiencing symptoms and is going to the ED to be tested today. Patient stated that she went to the ED yesterday, was not tested for COVID-19, but did have a chest x-ray completed.   [facility] plans to go [facility] today to [facility] for [facility].  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   Patient has contacted the Absence line regarding return to work.     Symptoms reviewed with patient who verbalized the following symptoms: fever, cough, shortness of breath, no new/worsening symptoms       Due to no new or worsening symptoms encounter was not routed to provider for escalation.      Patient has following risk factors of: None. CTN/ACM reviewed discharge instructions, medical action plan and red flags such as increased shortness of breath, increasing fever and signs of decompensation with patient who verbalized understanding.   Discussed exposure protocols and quarantine with CDC Guidelines ???What to do if you are sick with coronavirus disease 2019??? Patient who was given an opportunity for questions and concerns. The parent agrees to contact the [facility] exposure line [phone], local health department [place] [facility]  [phone] and PCP office for questions related  to their healthcare. CTN/ACM provided contact information for future reference.    Reviewed and educated patient on any new and changed medications related to discharge diagnosis     Plan for follow-up call in 3-5 days based on severity of symptoms and risk factors

## 2018-08-18 NOTE — Progress Notes (Signed)
Patient is currently enrolled in care management services.   Recent ED visit for COVID Testing    COVID-19 Screening Initial Follow-up Note    Patient contacted regarding COVID-19 exposure.  Care Transition Nurse/ Ambulatory Care Manager contacted the patient by telephone to perform post discharge assessment. Verified name and DOB with patient as identifiers. Provided introduction to self, and explanation of the CTN/ACM role, and reason for call due to risk factors for infection and/or exposure to COVID-19.   Patient reports that her father was tested for COVID and is currently hospitalized. Her mother is also experiencing symptoms and is going to the ED to be tested today. Patient stated that she went to the ED yesterday, was not tested for COVID-19, but did have a chest x-ray completed.   Patient plans to go to [facility] today to test for COVID-19.  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   Patient has contacted the Absence line regarding return to work.     Symptoms reviewed with patient who verbalized the following symptoms: fever, cough, shortness of breath, no new/worsening symptoms       Due to no new or worsening symptoms encounter was not routed to provider for escalation.      Patient has following risk factors of: None. CTN/ACM reviewed discharge instructions, medical action plan and red flags such as increased shortness of breath, increasing fever and signs of decompensation with patient who verbalized understanding.   Discussed exposure protocols and quarantine with CDC Guidelines "What to do if you are sick with coronavirus disease 2019" Patient who was given an opportunity for questions and concerns. The parent agrees to contact the [facility] exposure line [phone], local health department [place] [facility]  [phone] and PCP office for questions related to their healthcare. CTN/ACM provided contact information  for future reference.    Reviewed and educated patient on any new and changed medications related to discharge diagnosis     Plan for follow-up call in 3-5 days based on severity of symptoms and risk factors

## 2018-08-19 ENCOUNTER — Other Ambulatory Visit: Payer: Self-pay | Admitting: Physician Assistant

## 2018-08-19 LAB — CULTURE, THROAT
Culture result:: NORMAL
Culture: NORMAL

## 2018-08-22 ENCOUNTER — Encounter: Payer: Self-pay | Admitting: Physical Therapy

## 2018-08-22 ENCOUNTER — Ambulatory Visit (INDEPENDENT_AMBULATORY_CARE_PROVIDER_SITE_OTHER): Payer: BLUE CROSS/BLUE SHIELD | Admitting: Physical Therapy

## 2018-08-22 ENCOUNTER — Other Ambulatory Visit: Payer: Self-pay

## 2018-08-22 DIAGNOSIS — M25561 Pain in right knee: Secondary | ICD-10-CM | POA: Diagnosis not present

## 2018-08-22 DIAGNOSIS — R600 Localized edema: Secondary | ICD-10-CM

## 2018-08-22 DIAGNOSIS — M6281 Muscle weakness (generalized): Secondary | ICD-10-CM | POA: Diagnosis not present

## 2018-08-22 NOTE — Progress Notes (Signed)
Telephonic outreach to patient to follow up.  Verified name and DOB.  Patient stated that she is still not feeling well, that she is very fatigued, but she is also worried about caring for her parents.   Patient will be staying with her parents for the next few days, as her father will be discharging from hospital after being diagnosed with COVID-19. [name] mother is also showing symptoms and was testing 7 days ago in the ED, results still pending.   Patient was tested on 4/3, results pending.   Patient is feeling fatigued and is concerned about having the supplies needed to care for there parents.  Patient plans to borrow a pulse oximeter from her employer until one can purchased. Patient is also in need of a few thermometers.   -This CM reached out to MSW Andres Labrum for assistance obtaining supply equipment and supportive care for her parents.  -This CM will follow up with patient later this week.

## 2018-08-22 NOTE — Progress Notes (Signed)
Telephonic outreach to patient to follow up.  Verified name and DOB.  Patient stated that she is still not feeling well, that she is very fatigued, but she is also worried about caring for her parents.   Patient will be staying with her parents for the next few days, as her father will be discharging from hospital after being diagnosed with COVID-19. [name] mother is also showing symptoms and was testing 7 days ago in the ED, results still pending.   Patient was tested on 4/3, results pending.   Patient is feeling fatigued and is concerned about having the supplies needed to care for there parents.  Patient plans to borrow a pulse oximeter from her employer until one can purchased. Patient is also in need of a few thermometers.   -This CM reached out to MSW Alisha Gray-Johnson for assistance obtaining supply equipment and supportive care for her parents.  -This CM will follow up with patient later this week.

## 2018-08-22 NOTE — Patient Instructions (Signed)
HIP: Abduction - Side-Lying    Lie on side, legs straight and in line with trunk. Squeeze glutes. Raise top leg up and slightly back. Point toes forward. _10__ reps per set, __2-3_ sets per day, __5_ days per week Bend bottom leg to stabilize pelvis.  Knee Extension Mobilization: Hang (Prone)    With table supporting thighs, place _0__ pound weight on right ankle. Hold _30 seconds to_1__ minute. Repeat _2-3___ times per set. Do __2-3__ sets per session. Do __1-2__ sessions per day.  Hamstring Stretch (Standing)    Standing, place one heel on chair or bench. Use one or both hands on thigh for support. Keeping torso straight, lean forward slowly until a stretch is felt in back of same thigh. Hold __30__ seconds. Repeat with other leg.   [facility] [place], [place] [address]  [phone] (office) [phone] (fax)

## 2018-08-22 NOTE — Therapy (Signed)
[place], [place], [address] Phone: [phone]   Fax:  [phone]  Physical Therapy Treatment  Patient Details  Name: Judith [name] MRN: [id number] Date of Birth: 1971/03/24 Referring Provider (PT): Dr Levan Hurst    Encounter Date: 08/22/2018  PT End of Session - 08/22/18 1012    Visit Number  3    Number of Visits  8    Date for PT Re-Evaluation  08/30/18    PT Start Time  1005    PT Stop Time  1101    PT Time Calculation (min)  56 min       Past Medical History:  Diagnosis Date  . Asthma   . Bipolar 1 disorder ([place])   . Depression   . GERD (gastroesophageal reflux disease)   . Hyperlipemia   . Hypothyroid   . Migraines     Past Surgical History:  Procedure Laterality Date  . ABDOMINAL SURGERY    . CARPAL TUNNEL RELEASE Bilateral 1999  . CESAREAN SECTION    . CHOLECYSTECTOMY    . THYROIDECTOMY    . TONSILLECTOMY      There were no vitals filed for this visit.  Subjective Assessment - 08/22/18 1018    Subjective  Pt reports it's been a rough few days. "It feels like my calf is ripping, and my shin is hurting".  Continued stiffness. Has been working on her HEP daily.     Pertinent History  Lt TKA 01/20/18; asthma; HTN; gall bladder removed; depression; anxiety; c-section     Currently in Pain?  Yes    Pain Score  5     Pain Location  Knee    Pain Orientation  Right    Pain Descriptors / Indicators  Constant;Tightness    Aggravating Factors   prolonged positions     Pain Relieving Factors  ice         [facility] PT Assessment - 08/22/18 0001      Assessment   Medical Diagnosis  Rt TKA    Referring Provider (PT)  Dr Levan Hurst     Onset Date/Surgical Date  06/22/18    Next MD Visit  08/31/18      AROM   Right Knee Extension  -7   with quad set   Right Knee Flexion  122   AAROM, with strap assist     Flexibility   Quadriceps  Rt 96 deg        [facility] Adult  PT Treatment/Exercise - 08/22/18 0001      Knee/Hip Exercises: Stretches   Passive Hamstring Stretch  Right;3 reps;30 seconds   supine with strap    Quad Stretch  Right;3 reps;30 seconds   prone with strap    Gastroc Stretch  Both;3 reps;30 seconds   incline board.      Knee/Hip Exercises: Aerobic   Stationary Bike  L1-2 x 6 min, for ROM and warm up     Other Aerobic  laps around gym in between exercises to decrease stiffness.       Knee/Hip Exercises: Standing   Heel Raises  Both;1 set;10 reps    Lateral Step Up  Right;15 reps    Forward Step Up  Right;1 set;15 reps;Hand Hold: 1;Step Height: 6"   VC for slow controlled movement.    Step Down  Left;1 set;10 reps;Step Height: 2";Step Height: 6"   challenging; hops up backwards.    SLS  30  sec x 2 reps each leg with occasional UE support to steady (standing on blue pad)       Knee/Hip Exercises: Sidelying   Hip ABduction  Strengthening;Right;1 set;20 reps      Knee/Hip Exercises: Prone   Hamstring Curl  1 set;5 reps   in between prone hang.    Prone Knee Hang  1 minute   2 reps    Other Prone Exercises  quad set prone straightening knee with toe down 5 sec hold x 10       Vasopneumatic   Number Minutes Vasopneumatic   15 minutes    Vasopnuematic Location   Knee   Rt    Vasopneumatic Pressure  Medium    Vasopneumatic Temperature   34 deg      Kinesiotix   Edema  sensitive skin Rock tape applied for scar management with zigzag pattern    Inhibit Muscle   I strip of sensitive skin tape applied with 15% stretch applied to Rt lateral knee to prox shin; small perpendicular strip applied lateral to patella with 50% stretch to decompress tissue and decrease pain.                    PT Long Term Goals - 08/02/18 1212      PT LONG TERM GOAL #1   Title  Increase ROM Rt knee to 0 deg extension - 125 deg flexion 08/30/2018    Time  4    Period  Weeks    Status  New      PT LONG TERM GOAL #2   Title  5/5 strength Rt  LE 08/30/2018    Time  4    Period  Weeks    Status  New      PT LONG TERM GOAL #3   Title  Patient tolerating 30 min of standing and or walking 08/30/2018    Time  4    Period  Weeks    Status  New      PT LONG TERM GOAL #4   Title  Independent in HEP 08/30/2018    Time  4    Period  Weeks      PT LONG TERM GOAL #5   Title  Improve FOTO to </= 43% limitation 08/30/2018    Baseline  -    Time  4    Period  Weeks    Status  New            Plan - 08/22/18 1110    Clinical Impression Statement  Pt tolerated all exercises with only mild increase in discomfort. Pt required minor cues to slow exercise speed.  Pt making gradual progress with Rt knee ROM.  Progressing well towards therapy goals.     Rehab Potential  Excellent    PT Frequency  2x / week    PT Duration  4 weeks    PT Treatment/Interventions  Patient/family education;ADLs/Self Care Home Management;Cryotherapy;Electrical Stimulation;Iontophoresis 4mg /ml Dexamethasone;Moist Heat;Ultrasound;Dry needling;Manual techniques;Gait training;Stair training;Functional mobility training;Therapeutic activities;Therapeutic exercise;Balance training;Neuromuscular re-education;Taping    PT Next Visit Plan  reveiw HEP; progress with stretching and strengthening; gait training; manual work and modalities as indicated.     Consulted and Agree with Plan of Care  Patient       Patient will benefit from skilled therapeutic intervention in order to improve the following deficits and impairments:  Increased muscle spasms, Increased fascial restricitons, Decreased mobility, Decreased range of motion, Abnormal gait, Decreased activity tolerance,  Decreased endurance, Decreased strength, Pain  Visit Diagnosis: Acute pain of right knee  Muscle weakness (generalized)  Localized edema     Problem List Patient Active Problem List   Diagnosis Date Noted  . Acute cystitis without hematuria 03/21/2018  . History of left knee replacement  02/10/2018  . Morbid obesity ([place]) 11/21/2017  . Primary osteoarthritis of left knee 09/22/2017  . Primary osteoarthritis of right knee 09/22/2017  . Attention deficit hyperactivity disorder (ADHD) 05/12/2016  . Episodic mood disorder ([place]) 05/12/2016  . Type 2 diabetes mellitus without complication, without long-term current use of insulin ([place]) 05/12/2016  . Gastroesophageal reflux disease without esophagitis 05/12/2016  . DYSPNEA 07/22/2010  . CHEST PAIN 07/22/2010  . Hypothyroidism 09/13/2007  . Hyperlipidemia 09/13/2007  . ANXIETY DEPRESSION 09/13/2007  . MIGRAINE HEADACHE 09/13/2007  . Essential hypertension 09/13/2007  . ASTHMA 09/13/2007  . CHOLELITHIASIS, HX OF 09/13/2007  . NEPHROLITHIASIS, HX OF 09/13/2007  . CARPAL TUNNEL RELEASE, HX OF 09/13/2007   Kerin Perna, PTA 08/22/18 11:37 AM  [place], [place], [address] Phone: [phone]   Fax:  [phone]  Name: CRESENCIA ASMUS MRN: [id number] Date of Birth: 1970-11-30

## 2018-08-24 ENCOUNTER — Ambulatory Visit (INDEPENDENT_AMBULATORY_CARE_PROVIDER_SITE_OTHER): Payer: BLUE CROSS/BLUE SHIELD | Admitting: Physical Therapy

## 2018-08-24 ENCOUNTER — Other Ambulatory Visit: Payer: Self-pay

## 2018-08-24 ENCOUNTER — Encounter: Payer: Self-pay | Admitting: Physical Therapy

## 2018-08-24 DIAGNOSIS — R2689 Other abnormalities of gait and mobility: Secondary | ICD-10-CM

## 2018-08-24 DIAGNOSIS — M6281 Muscle weakness (generalized): Secondary | ICD-10-CM

## 2018-08-24 DIAGNOSIS — R600 Localized edema: Secondary | ICD-10-CM

## 2018-08-24 DIAGNOSIS — M25561 Pain in right knee: Secondary | ICD-10-CM | POA: Diagnosis not present

## 2018-08-24 NOTE — Progress Notes (Signed)
Telephonic outreach to patient to follow up and provide information resources.   Verified name and DOB.  -Patient states that she received her test results back and she is negative for COVID-19.  [name] main concern now is for her parents. Her father tested positive and has been hospitalized for several days, returning home today or tomorrow. Her mother also tested positive and is at home with symptoms.   -Patient encouraged not to go into the home and to isolated regardless of negative result, largely for the fact that her mother tested negative and she has been exposed since her test.   Patient provided the number for the [place] [facility], the [facility] Conduit line and encouraged to stay in touch with the [employer] for return to work instructions.   This CM will reach out to patient next week for follow up call.

## 2018-08-24 NOTE — Progress Notes (Signed)
Telephonic outreach to patient to follow up and provide information resources.   Verified name and DOB.  -Patient states that she received her test results back and she is negative for COVID-19.  [name] main concern now is for her parents. Her father tested positive and has been hospitalized for several days, returning home today or tomorrow. Her mother also tested positive and is at home with symptoms.   -Patient encouraged not to go into the home and to isolated regardless of negative result, largely for the fact that her mother tested negative and she has been exposed since her test.   Patient provided the number for the [facility] the [facility] Conduit line and encouraged to stay in touch with the [employer] Line for return to work instructions.   This CM will reach out to patient next week for follow up call.

## 2018-08-24 NOTE — Therapy (Signed)
[place], [place], [address] Phone: [phone]   Fax:  [phone]  Physical Therapy Treatment  Patient Details  Name: Judith [name] MRN: [id number] Date of Birth: [birth year] Referring Provider (PT): Dr Levan Hurst    Encounter Date: 08/24/2018  PT End of Session - 08/24/18 1118    Visit Number  4    Number of Visits  8    Date for PT Re-Evaluation  08/30/18    PT Start Time  1111    PT Stop Time  1204    PT Time Calculation (min)  53 min    Activity Tolerance  Patient tolerated treatment well    Behavior During Therapy  [facility] for tasks assessed/performed       Past Medical History:  Diagnosis Date  . Asthma   . Bipolar 1 disorder ([place])   . Depression   . GERD (gastroesophageal reflux disease)   . Hyperlipemia   . Hypothyroid   . Migraines     Past Surgical History:  Procedure Laterality Date  . ABDOMINAL SURGERY    . CARPAL TUNNEL RELEASE Bilateral 1999  . CESAREAN SECTION    . CHOLECYSTECTOMY    . THYROIDECTOMY    . TONSILLECTOMY      There were no vitals filed for this visit.  Subjective Assessment - 08/24/18 1119    Subjective  Pt reports she is having a difficult time straightening her knee.   She's had to take more medication to decrease pain so that she can sleep.  Pt reports the sensitive skin tape fell off the same night it was applied.  when it was on, it had provided relief of pain.     Currently in Pain?  Yes    Pain Score  5     Pain Location  Knee    Pain Orientation  Right    Pain Descriptors / Indicators  Throbbing    Aggravating Factors   prolonged positions    Pain Relieving Factors  ice, medication          [facility] PT Assessment - 08/24/18 0001      Assessment   Medical Diagnosis  Rt TKA    Referring Provider (PT)  Dr Levan Hurst     Onset Date/Surgical Date  06/22/18    Next MD Visit  08/31/18       [facility] Adult PT Treatment/Exercise -  08/24/18 0001      Ambulation/Gait   Ambulation/Gait  Yes    Ambulation/Gait Assistance  5: Supervision    Ambulation Distance (Feet)  --   40-80 ft gait trials; multiple reps.    Gait Pattern  Step-through pattern;Decreased arm swing - left;Decreased step length - right;Decreased stance time - right;Decreased dorsiflexion - right    Ambulation Surface  Indoor;Level    Gait velocity - backwards  (see treadmill in aerobic)    Pre-Gait Activities  tandem walk forward and backwards at counter with light UE support x 33ft x 8 reps     Gait Comments  VC for decreased step length Lt, increased DF on Rt, tactile cues for reciprocal arm swing, VC /tactile cues to increase weight shift to Rt.  Some carry over with repetition and cues.       Knee/Hip Exercises: Stretches   Sports administrator  Right;3 reps;30 seconds   prone with strap    Gastroc Stretch  Both;3 reps;30 seconds   incline board.  Knee/Hip Exercises: Aerobic   Stationary Bike  L1-2 x 5 min, for ROM and warm up     Tread Mill  retro gait @ -0.7 to -0.9 mph, cues for equal step and knee ext. x 3 min     Other Aerobic  laps around gym in between exercises to decrease stiffness.       Knee/Hip Exercises: Standing   Lateral Step Up Limitations  cross over lateral step ups on 4" step leading with RLE x 10; repeated on 6" step    Functional Squat  2 sets;10 reps      Modalities   Modalities  Moist Heat;Vasopneumatic      Moist Heat Therapy   Number Minutes Moist Heat  10 Minutes    Moist Heat Location  Hip   RLe     Vasopneumatic   Number Minutes Vasopneumatic   10 minutes    Vasopnuematic Location   Knee   Rt    Vasopneumatic Pressure  Low    Vasopneumatic Temperature   34 deg      Manual Therapy   Manual therapy comments  pt supine hooklying     Soft tissue mobilization  IASTM and STM to Lt lateral distal to mid quad to decrease tightness, pain and fascial banding.       Kinesiotix   Edema  Regular  Rock tape applied for  scar management with zigzag pattern    Inhibit Muscle   I strip of Regular Rock tape applied with 15% stretch applied to Rt lateral knee to prox shin; small perpendicular strip applied lateral to patella with 50% stretch to decompress tissue and decrease pain.               PT Education - 08/24/18 1227    Education Details  HEP - from last session was mailed. verbally reviewed today.     Person(s) Educated  Patient    Methods  Explanation;Handout    Comprehension  Verbalized understanding          PT Long Term Goals - 08/02/18 1212      PT LONG TERM GOAL #1   Title  Increase ROM Rt knee to 0 deg extension - 125 deg flexion 08/30/2018    Time  4    Period  Weeks    Status  New      PT LONG TERM GOAL #2   Title  5/5 strength Rt LE 08/30/2018    Time  4    Period  Weeks    Status  New      PT LONG TERM GOAL #3   Title  Patient tolerating 30 min of standing and or walking 08/30/2018    Time  4    Period  Weeks    Status  New      PT LONG TERM GOAL #4   Title  Independent in HEP 08/30/2018    Time  4    Period  Weeks      PT LONG TERM GOAL #5   Title  Improve FOTO to </= 43% limitation 08/30/2018    Baseline  -    Time  4    Period  Weeks    Status  New            Plan - 08/24/18 1227    Clinical Impression Statement  Pt reporting continued tightness/ pain in Rt lateral knee.  Some mild improvement with manual therapy / tape application.  Gait quality  improved with cues and repetition, however only minor carry over observed when pt was leaving treatment session.  Pt progressing towards goals and will benefit from continued PT intervention to max functional mobility and return to work.     Rehab Potential  Excellent    PT Frequency  2x / week    PT Duration  4 weeks    PT Treatment/Interventions  Patient/family education;ADLs/Self Care Home Management;Cryotherapy;Electrical Stimulation;Iontophoresis 4mg /ml Dexamethasone;Moist Heat;Ultrasound;Dry needling;Manual  techniques;Gait training;Stair training;Functional mobility training;Therapeutic activities;Therapeutic exercise;Balance training;Neuromuscular re-education;Taping    PT Next Visit Plan  reveiw HEP; progress with stretching and strengthening; gait training; manual work and modalities as indicated.     Consulted and Agree with Plan of Care  Patient       Patient will benefit from skilled therapeutic intervention in order to improve the following deficits and impairments:  Increased muscle spasms, Increased fascial restricitons, Decreased mobility, Decreased range of motion, Abnormal gait, Decreased activity tolerance, Decreased endurance, Decreased strength, Pain  Visit Diagnosis: Acute pain of right knee  Muscle weakness (generalized)  Localized edema  Other abnormalities of gait and mobility     Problem List Patient Active Problem List   Diagnosis Date Noted  . Acute cystitis without hematuria 03/21/2018  . History of left knee replacement 02/10/2018  . Morbid obesity ([place]) 11/21/2017  . Primary osteoarthritis of left knee 09/22/2017  . Primary osteoarthritis of right knee 09/22/2017  . Attention deficit hyperactivity disorder (ADHD) 05/12/2016  . Episodic mood disorder ([place]) 05/12/2016  . Type 2 diabetes mellitus without complication, without long-term current use of insulin ([place]) 05/12/2016  . Gastroesophageal reflux disease without esophagitis 05/12/2016  . DYSPNEA 07/22/2010  . CHEST PAIN 07/22/2010  . Hypothyroidism 09/13/2007  . Hyperlipidemia 09/13/2007  . ANXIETY DEPRESSION 09/13/2007  . MIGRAINE HEADACHE 09/13/2007  . Essential hypertension 09/13/2007  . ASTHMA 09/13/2007  . CHOLELITHIASIS, HX OF 09/13/2007  . NEPHROLITHIASIS, HX OF 09/13/2007  . CARPAL TUNNEL RELEASE, HX OF 09/13/2007   Kerin Perna, PTA 08/24/18 12:32 PM  [place], [place], [address] Phone:  [phone]   Fax:  [phone]  Name: ANALYAH MCCONNON MRN: [id number] Date of Birth: Aug 09, 1970

## 2018-08-29 ENCOUNTER — Inpatient Hospital Stay
Payer: PRIVATE HEALTH INSURANCE | Attending: Rehabilitative and Restorative Service Providers" | Primary: Family Medicine

## 2018-08-29 ENCOUNTER — Ambulatory Visit (INDEPENDENT_AMBULATORY_CARE_PROVIDER_SITE_OTHER): Payer: BLUE CROSS/BLUE SHIELD | Admitting: Physical Therapy

## 2018-08-29 ENCOUNTER — Encounter: Payer: Self-pay | Admitting: Physical Therapy

## 2018-08-29 DIAGNOSIS — R600 Localized edema: Secondary | ICD-10-CM | POA: Diagnosis not present

## 2018-08-29 DIAGNOSIS — M25561 Pain in right knee: Secondary | ICD-10-CM | POA: Diagnosis not present

## 2018-08-29 DIAGNOSIS — M6281 Muscle weakness (generalized): Secondary | ICD-10-CM | POA: Diagnosis not present

## 2018-08-29 NOTE — Therapy (Signed)
[place], [place], [address] Phone: [phone]   Fax:  [phone]  Physical Therapy Treatment  Patient Details  Name: Judith [name] MRN: [id number] Date of Birth: 12/20/1970 Referring Provider (PT): Dr Levan Hurst    Encounter Date: 08/29/2018  PT End of Session - 08/29/18 1109    Visit Number  5    Number of Visits  8    Date for PT Re-Evaluation  08/30/18    PT Start Time  1105    PT Stop Time  1200    PT Time Calculation (min)  55 min    Behavior During Therapy  [facility] for tasks assessed/performed       Past Medical History:  Diagnosis Date  . Asthma   . Bipolar 1 disorder ([place])   . Depression   . GERD (gastroesophageal reflux disease)   . Hyperlipemia   . Hypothyroid   . Migraines     Past Surgical History:  Procedure Laterality Date  . ABDOMINAL SURGERY    . CARPAL TUNNEL RELEASE Bilateral 1999  . CESAREAN SECTION    . CHOLECYSTECTOMY    . THYROIDECTOMY    . TONSILLECTOMY      There were no vitals filed for this visit.  Subjective Assessment - 08/29/18 1109    Subjective  Pt reports she has been massaging her calf and leg and this has been helping with the tightness.  She has been doing a lot of walking in community lately.     Pertinent History  Lt TKA 01/20/18; asthma; HTN; gall bladder removed; depression; anxiety; c-section     Patient Stated Goals  back to work     Currently in Pain?  Yes    Pain Score  4     Pain Location  Knee    Pain Orientation  Right    Pain Descriptors / Indicators  Tightness;Throbbing    Aggravating Factors   prolonged positions    Pain Relieving Factors  ice, medication          [facility] PT Assessment - 08/29/18 0001      Assessment   Medical Diagnosis  Rt TKA    Referring Provider (PT)  Dr Levan Hurst     Onset Date/Surgical Date  06/22/18    Next MD Visit  08/31/18      AROM   Right Knee Extension  -7    Right Knee  Flexion  120      PROM   Right Knee Extension  -4    Right Knee Flexion  124        [facility] Adult PT Treatment/Exercise - 08/29/18 0001      Knee/Hip Exercises: Stretches   Passive Hamstring Stretch  Right;2 reps;30 seconds   foot on 12" step, overpressure from pt.    Quad Stretch  Right;3 reps;60 seconds   prone with strap    Gastroc Stretch  Both;3 reps;30 seconds   incline board.    Other Knee/Hip Stretches  Rt quad stretch with PNF x 5 reps       Knee/Hip Exercises: Aerobic   Stationary Bike  L1-2 x 5.5 min, for ROM and warm up     Other Aerobic  laps around gym in between exercises to decrease stiffness.       Knee/Hip Exercises: Standing   Lateral Step Up Limitations  cross over lateral step ups on 6" step leading with RLE x 15  Other Standing Knee Exercises  retro tandem gait with light UE support on counter      Knee/Hip Exercises: Supine   Quad Sets  AROM;Right;1 set;5 reps    Heel Slides  AAROM;Right;1 set;5 reps   10 sec holds, strap assist   Bridges  1 set;15 reps      Vasopneumatic   Number Minutes Vasopneumatic   10 minutes    Vasopnuematic Location   Knee   Rt    Vasopneumatic Pressure  Low    Vasopneumatic Temperature   34 deg      Manual Therapy   Manual Therapy  Passive ROM;Soft tissue mobilization    Soft tissue mobilization  STM to Rt distal to mid quad and Rt hamstring.     Passive ROM  overpressure into Rt knee ext x 20 sec, multiple repse      Kinesiotix   Edema  Regular  Rock tape applied for scar management with zigzag pattern    Inhibit Muscle   I strip of Regular Rock tape applied with 15% stretch applied to Rt lateral knee to prox shin; small perpendicular strip applied lateral to patella with 50% stretch to decompress tissue and decrease pain.                    PT Long Term Goals - 08/29/18 1118      PT LONG TERM GOAL #1   Title  Increase ROM Rt knee to 0 deg extension - 125 deg flexion 08/30/2018    Time  4    Period   Weeks    Status  On-going      PT LONG TERM GOAL #2   Title  5/5 strength Rt LE 08/30/2018    Time  4    Period  Weeks    Status  On-going      PT LONG TERM GOAL #3   Title  Patient tolerating 30 min of standing and or walking 08/30/2018    Time  4    Period  Weeks    Status  On-going   08/29/18: able to stand/walk 25 min, but pain increases up to 7/10.      PT LONG TERM GOAL #4   Title  Independent in HEP 08/30/2018    Time  4    Period  Weeks    Status  On-going      PT LONG TERM GOAL #5   Title  Improve FOTO to </= 43% limitation 08/30/2018    Time  4    Period  Weeks    Status  On-going            Plan - 08/29/18 1145    Clinical Impression Statement  Pt's Rt knee ROM remains similar to last assessment.  Palpable tightness throughout Rt hamstring and quad.  She reported mild increase in discomfort with exercises and passive ROM; reduced with use of vaso at end of session.  Pt gradually progressing towards remaining goals.     Rehab Potential  Excellent    PT Frequency  2x / week    PT Duration  4 weeks    PT Treatment/Interventions  Patient/family education;ADLs/Self Care Home Management;Cryotherapy;Electrical Stimulation;Iontophoresis 4mg /ml Dexamethasone;Moist Heat;Ultrasound;Dry needling;Manual techniques;Gait training;Stair training;Functional mobility training;Therapeutic activities;Therapeutic exercise;Balance training;Neuromuscular re-education;Taping    PT Next Visit Plan  continue aggressive ROM, progressive strengthening, gait training.     Consulted and Agree with Plan of Care  Patient       Patient will  benefit from skilled therapeutic intervention in order to improve the following deficits and impairments:  Increased muscle spasms, Increased fascial restricitons, Decreased mobility, Decreased range of motion, Abnormal gait, Decreased activity tolerance, Decreased endurance, Decreased strength, Pain  Visit Diagnosis: Acute pain of right knee  Muscle  weakness (generalized)  Localized edema     Problem List Patient Active Problem List   Diagnosis Date Noted  . Acute cystitis without hematuria 03/21/2018  . History of left knee replacement 02/10/2018  . Morbid obesity ([place]) 11/21/2017  . Primary osteoarthritis of left knee 09/22/2017  . Primary osteoarthritis of right knee 09/22/2017  . Attention deficit hyperactivity disorder (ADHD) 05/12/2016  . Episodic mood disorder ([place]) 05/12/2016  . Type 2 diabetes mellitus without complication, without long-term current use of insulin ([place]) 05/12/2016  . Gastroesophageal reflux disease without esophagitis 05/12/2016  . DYSPNEA 07/22/2010  . CHEST PAIN 07/22/2010  . Hypothyroidism 09/13/2007  . Hyperlipidemia 09/13/2007  . ANXIETY DEPRESSION 09/13/2007  . MIGRAINE HEADACHE 09/13/2007  . Essential hypertension 09/13/2007  . ASTHMA 09/13/2007  . CHOLELITHIASIS, HX OF 09/13/2007  . NEPHROLITHIASIS, HX OF 09/13/2007  . CARPAL TUNNEL RELEASE, HX OF 09/13/2007   Kerin Perna, PTA 08/29/18 12:11 PM  [place], [place], [address] Phone: [phone]   Fax:  [phone]  Name: CHANAY NUGENT MRN: [id number] Date of Birth: 11-03-1970

## 2018-08-30 ENCOUNTER — Inpatient Hospital Stay
Admit: 2018-08-30 | Discharge: 2018-08-30 | Payer: PRIVATE HEALTH INSURANCE | Attending: Rehabilitative and Restorative Service Providers" | Primary: Family Medicine

## 2018-08-30 ENCOUNTER — Encounter: Payer: Self-pay | Admitting: Physician Assistant

## 2018-08-30 ENCOUNTER — Ambulatory Visit (INDEPENDENT_AMBULATORY_CARE_PROVIDER_SITE_OTHER): Payer: BLUE CROSS/BLUE SHIELD | Admitting: Physician Assistant

## 2018-08-30 ENCOUNTER — Other Ambulatory Visit: Payer: Self-pay

## 2018-08-30 DIAGNOSIS — M25512 Pain in left shoulder: Secondary | ICD-10-CM

## 2018-08-30 DIAGNOSIS — M1711 Unilateral primary osteoarthritis, right knee: Secondary | ICD-10-CM

## 2018-08-30 MED ORDER — TRAMADOL HCL 50 MG PO TABS: 50.0000 mg | ORAL_TABLET | Freq: Three times a day (TID) | ORAL | 5 refills | Status: DC | PRN

## 2018-08-30 NOTE — Progress Notes (Signed)
PT DAILY TREATMENT NOTE 2-15    Patient Name: Sara Cooke  Date:08/30/2018  DOB: 1970/06/07  [x]   Patient DOB Verified  Payor: [employer] OF  / Plan: [employer] / Product Type: Commerical /    In time:1108  Out time:1143  Total Treatment Time (min): 35  Visit #:  27    Treatment Area: Left shoulder pain [M25.512]    Sara Cooke was informed of the inherent limitations of a virtual visit,  and has consented to a virtual therapy visit on 08/30/2018.  Information regarding emergency contact information for this patient during this visit is to contact:  Sara Cooke at [phone] in addition to calling 911.  The patient was informed that at any time during the virtual visit, they can decide to stop the virtual visit.  The patient verified that they are physically located in the state of [place] for this virtual visit.    SUBJECTIVE  Pain Level (0-10 scale): 0  Any medication changes, allergies to medications, adverse drug reactions, diagnosis change, or new procedure performed?: [x]  No    []  Yes (see summary sheet for update)  Subjective functional status/changes:   []  No changes reported  Pt reports that she has been taking care of her parents who have had the coronavirus so she hasn't done much of her HEP.      OBJECTIVE      35 min Therapeutic Exercise:  [x]  See flow sheet :   Rationale: increase ROM, increase strength, improve coordination, improve balance and increase proprioception to improve the patient???s ability to complete all activity    Other Objective/Functional Measures: Access Code: [email or web address]   URL: [email or web address]   Date: 08/30/2018   Prepared by: Richardson Landryavid Jonavin Seder     See attached media for pictures of current AROM  Exercises   Sidelying Shoulder External Rotation - 10 reps - 2 sets - 1 lb hold - 2x daily   Sidelying Shoulder Horizontal Abduction - 10 reps - 2 sets - 1lb hold - 2x daily    Sidelying Shoulder Abduction - 10 reps - 2 sets - 1lb hold - 2x daily   Supine Shoulder Flexion Extension AAROM with Dowel - 10 reps - 2 sets - 2x daily   Supine Shoulder External Rotation with Dowel - 10 reps - 2 sets - 2x daily   Supine Shoulder Abduction AAROM with Dowel - 10 reps - 2 sets - 2x daily   Standing shoulder flexion wall slides - 10 reps - 2 sets - 2x daily   Prone Shoulder Horizontal Abduction - 10 reps - 2 sets - 2x daily   Standing Wall Ball Circles in Scaption with Mini Swiss Ball - 10 reps - 2 sets - 2x daily   Standing Wall [employer] Circles with [employer]ini Swiss Ball - 10 reps - 2 sets - 2x daily        Pain Level (0-10 scale) post treatment: 2    ASSESSMENT/Changes in Function:   Pt is progressing well but needs to be more consistent with improving her strengthening.  Have emailed a new HEP to her with some modifications and encouraged her to complete the routine start to finish instead of breaking it up during the day.  Will continue to see her 1 x week     Patient will continue to benefit from skilled PT services to modify and progress therapeutic interventions, address functional mobility deficits, address ROM deficits, address strength deficits, analyze  and address soft tissue restrictions, analyze and cue movement patterns, analyze and modify body mechanics/ergonomics, assess and modify postural abnormalities and address imbalance/dizziness to attain remaining goals.     []   See Plan of Care  []   See progress note/recertification  []   See Discharge Summary         PLAN  [x]   Upgrade activities as tolerated     [x]   Continue plan of care  [x]   Update interventions per flow sheet       []   Discharge due to:_  []   Other:_        Sara Cooke is a 48 y.o. female being evaluated by a Virtual Visit (video visit) encounter to address concerns as mentioned above.  A caregiver was present when appropriate. Due to this being a Scientist, research (medical) (During  COVID-19 public health emergency), evaluation of the following areas was limited: Direct tissue palpation, direct goniometric measurements, blood pressure, O2 saturation. Pursuant to the emergency declaration under the [facility] Act and the [employer], 1135 waiver authority and the [employer] Act, this Virtual Visit was conducted with patient's (and/or legal guardian's) consent, to reduce the risk of exposure to COVID-19 and provide necessary medical care.      Services were provided through a video synchronous discussion virtually to substitute for in-person encounter.    --[name], PT on 08/30/2018 at 11:54 AM    An electronic signature was used to authenticate this note.

## 2018-08-30 NOTE — Progress Notes (Signed)
 PT DAILY TREATMENT NOTE 2-15    Patient Name: Sara Cooke  Date:08/30/2018  DOB: 05/20/1970  [x]   Patient DOB Verified  Payor: [employer] OF [place]  / Plan: [employer] / Product Type: Commerical /    In time:1108  Out time:1143  Total Treatment Time (min): 35  Visit #:  27    Treatment Area: Left shoulder pain [M25.512]    Sara Cooke was informed of the inherent limitations of a virtual visit,  and has consented to a virtual therapy visit on 08/30/2018.  Information regarding emergency contact information for this patient during this visit is to contact:  [name] at [phone] in addition to calling 911.  The patient was informed that at any time during the virtual visit, they can decide to stop the virtual visit.  The patient verified that they are physically located in the state of [place]  for this virtual visit.    SUBJECTIVE  Pain Level (0-10 scale): 0  Any medication changes, allergies to medications, adverse drug reactions, diagnosis change, or new procedure performed?: [x]  No    []  Yes (see summary sheet for update)  Subjective functional status/changes:   []  No changes reported  Pt reports that she has been taking care of her parents who have had the coronavirus so she hasn't done much of her HEP.      OBJECTIVE      35 min Therapeutic Exercise:  [x]  See flow sheet :   Rationale: increase ROM, increase strength, improve coordination, improve balance and increase proprioception to improve the patient's ability to complete all activity    Other Objective/Functional Measures: Access Code: [email or web address]   URL: [email or web address]   Date: 08/30/2018   Prepared by: Alm Ada     See attached media for pictures of current AROM  Exercises   Sidelying Shoulder External Rotation - 10 reps - 2 sets - 1 lb hold - 2x daily   Sidelying Shoulder Horizontal Abduction - 10 reps - 2 sets - 1lb hold - 2x daily   Sidelying Shoulder Abduction - 10 reps - 2 sets - 1lb hold - 2x  daily   Supine Shoulder Flexion Extension AAROM with Dowel - 10 reps - 2 sets - 2x daily   Supine Shoulder External Rotation with Dowel - 10 reps - 2 sets - 2x daily   Supine Shoulder Abduction AAROM with Dowel - 10 reps - 2 sets - 2x daily   Standing shoulder flexion wall slides - 10 reps - 2 sets - 2x daily   Prone Shoulder Horizontal Abduction - 10 reps - 2 sets - 2x daily   Standing Wall Ball Circles in Scaption with Mini Swiss Ball - 10 reps - 2 sets - 2x daily   Standing Wall [employer] with [employer] - 10 reps - 2 sets - 2x daily        Pain Level (0-10 scale) post treatment: 2    ASSESSMENT/Changes in Function:   Pt is progressing well but needs to be more consistent with improving her strengthening.  Have emailed a new HEP to her with some modifications and encouraged her to complete the routine start to finish instead of breaking it up during the day.  Will continue to see her 1 x week     Patient will continue to benefit from skilled PT services to modify and progress therapeutic interventions, address functional mobility deficits, address ROM deficits, address strength deficits, analyze  and address soft tissue restrictions, analyze and cue movement patterns, analyze and modify body mechanics/ergonomics, assess and modify postural abnormalities and address imbalance/dizziness to attain remaining goals.     []   See Plan of Care  []   See progress note/recertification  []   See Discharge Summary         PLAN  [x]   Upgrade activities as tolerated     [x]   Continue plan of care  [x]   Update interventions per flow sheet       []   Discharge due to:_  []   Other:_        Sara Cooke is a 48 y.o. female being evaluated by a Virtual Visit (video visit) encounter to address concerns as mentioned above.  A caregiver was present when appropriate. Due to this being a Scientist, research (medical) (During COVID-19 public health emergency), evaluation of the following areas was limited: Direct tissue palpation, direct  goniometric measurements, blood pressure, O2 saturation. Pursuant to the emergency declaration under the [facility] Act and the [employer], 1135 waiver authority and the [employer] Act, this Virtual Visit was conducted with patient's (and/or legal guardian's) consent, to reduce the risk of exposure to COVID-19 and provide necessary medical care.      Services were provided through a video synchronous discussion virtually to substitute for in-person encounter.    --Alm [name], PT on 08/30/2018 at 11:54 AM    An electronic signature was used to authenticate this note.

## 2018-08-30 NOTE — Progress Notes (Signed)
Telephone visit  Subjective: CC: Short-term disability on arthritis of the knee PCP: Terald Sleeper, PA-C HPI:Judith [name] is a 48 y.o. female calls for telephone consult today. Patient provides verbal consent for consult held via phone.  Patient is identified with 2 separate identifiers.  At this time the entire area is on COVID-19 social distancing and stay home orders are in place.  Patient is of higher risk and therefore we are performing this by a virtual method.  Location of patient: home Location of provider: [facility] Others present for call: no  This patient has had her knee replacement surgery she is still undergoing some physical therapy.  She is greatly improved.  She is currently having her short-term disability through April 28.  It had already been approved.  She needs an extension to May 14 for her short-term disability ability.  And she will return to work on 09/30/2018.  Work note will need to be given and [employer] papers will be coming for an extension of the paperwork.  She does need a refill on tramadol which she is beginning to use less and less.  She has not needed anything stronger for her pain.   ROS: Per HPI  Allergies  Allergen Reactions  . Diphenhydramine Other (See Comments)    Hair feels like it is crawling  . Morphine Nausea And Vomiting    PCA PUMP N/V IV PUSH IN ER NO PROBLEM PER PT.  . Prednisone Nausea And Vomiting  . Erythromycin Hives  . Sulfonamide Derivatives Other (See Comments)    Does not take due to family history   Past Medical History:  Diagnosis Date  . Asthma   . Bipolar 1 disorder ([place])   . Depression   . GERD (gastroesophageal reflux disease)   . Hyperlipemia   . Hypothyroid   . Migraines     Current Outpatient Medications:  .  albuterol (PROVENTIL) (2.5 MG/3ML) 0.083% nebulizer solution, Nebulize 1 vial three times daily, Disp: 150 mL, Rfl: 2 .  aspirin EC 81 MG tablet, Take 162 mg by mouth daily. , Disp: , Rfl:  .   atorvastatin (LIPITOR) 10 MG tablet, Take 1 tablet (10 mg total) by mouth daily., Disp: 90 tablet, Rfl: 3 .  budesonide-formoterol (SYMBICORT) 160-4.5 MCG/ACT inhaler, Inhale 2 puffs into the lungs 2 (two) times daily., Disp: 1 Inhaler, Rfl: 3 .  busPIRone (BUSPAR) 15 MG tablet, Take 15 mg by mouth 3 (three) times daily. , Disp: , Rfl:  .  cetirizine (ZYRTEC) 10 MG tablet, Take 1 tablet (10 mg total) by mouth daily. (Patient taking differently: Take 20 mg by mouth 2 (two) times daily. ), Disp: 30 tablet, Rfl: 11 .  diclofenac sodium (VOLTAREN) 1 % GEL, Apply 4 g topically 4 (four) times daily., Disp: 400 g, Rfl: 2 .  dicyclomine (BENTYL) 20 MG tablet, Take 1 tablet (20 mg total) by mouth every 6 (six) hours., Disp: 60 tablet, Rfl: 5 .  doxepin (SINEQUAN) 25 MG capsule, Take 25 mg by mouth at bedtime. , Disp: , Rfl:  .  fluconazole (DIFLUCAN) 150 MG tablet, 1 po q week x 4 weeks, Disp: 4 tablet, Rfl: 2 .  hydrOXYzine (ATARAX/VISTARIL) 25 MG tablet, Take 1 tablet (25 mg total) by mouth daily. (Patient taking differently: Take 25 mg by mouth 3 (three) times daily. ), Disp: 30 tablet, Rfl: 1 .  ibuprofen (ADVIL,MOTRIN) 800 MG tablet, Take 1 tablet (800 mg total) by mouth every 8 (eight) hours as needed.,  Disp: 90 tablet, Rfl: 5 .  lamoTRIgine (LAMICTAL) 150 MG tablet, Take 150 mg by mouth 2 (two) times daily., Disp: , Rfl:  .  levothyroxine (SYNTHROID, LEVOTHROID) 125 MCG tablet, TAKE ONE TAB BY MOUTH DAILY, Disp: 90 tablet, Rfl: 1 .  Lisdexamfetamine Dimesylate (VYVANSE PO), Take 70 mg by mouth daily. , Disp: , Rfl:  .  lisinopril (PRINIVIL,ZESTRIL) 20 MG tablet, Take 20 mg by mouth daily., Disp: , Rfl:  .  ondansetron (ZOFRAN ODT) 8 MG disintegrating tablet, Take 1 tablet (8 mg total) by mouth every 8 (eight) hours as needed for nausea or vomiting., Disp: 20 tablet, Rfl: 0 .  pantoprazole (PROTONIX) 40 MG tablet, TAKE ONE TAB BY MOUTH 2 TIMES DAILY, Disp: 180 tablet, Rfl: 0 .  pioglitazone (ACTOS) 15  MG tablet, TAKE 1 TABLET BY MOUTH DAILY, Disp: 90 tablet, Rfl: 0 .  sertraline (ZOLOFT) 100 MG tablet, Take 200 mg by mouth daily. , Disp: , Rfl:  .  silver sulfADIAZINE (SILVADENE) 1 % cream, Apply topically., Disp: , Rfl:  .  SUMAtriptan Succinate 4 MG/0.5ML SOCT, Inject 4 mg into the skin once., Disp: 9 Cartridge, Rfl: 11 .  traMADol (ULTRAM) 50 MG tablet, Take 1 tablet (50 mg total) by mouth every 8 (eight) hours as needed for severe pain., Disp: 90 tablet, Rfl: 5  Assessment/ Plan: 48 y.o. female   1. Primary osteoarthritis of right knee  Needs new short-term disability note through May 14, she was already approved for April 28.  New papers from her disability company, [employer], will be coming.  It is okay to do the extension paper and write a note  - traMADol (ULTRAM) 50 MG tablet; Take 1 tablet (50 mg total) by mouth every 8 (eight) hours as needed for severe pain.  Dispense: 90 tablet; Refill: 5   Start time: 11:16 AM End time: 11:25 AM  Meds ordered this encounter  Medications  . traMADol (ULTRAM) 50 MG tablet    Sig: Take 1 tablet (50 mg total) by mouth every 8 (eight) hours as needed for severe pain.    Dispense:  90 tablet    Refill:  5    Order Specific Question:   Supervising Provider    Answer:   Janora Norlander [[id number]]    Particia Nearing PA-C [place] [phone]

## 2018-08-31 ENCOUNTER — Encounter: Payer: BLUE CROSS/BLUE SHIELD | Admitting: Physician Assistant

## 2018-08-31 ENCOUNTER — Encounter: Payer: Self-pay | Admitting: *Deleted

## 2018-08-31 ENCOUNTER — Other Ambulatory Visit: Payer: Self-pay

## 2018-08-31 ENCOUNTER — Ambulatory Visit (INDEPENDENT_AMBULATORY_CARE_PROVIDER_SITE_OTHER): Payer: BLUE CROSS/BLUE SHIELD | Admitting: Rehabilitative and Restorative Service Providers"

## 2018-08-31 ENCOUNTER — Encounter: Payer: Self-pay | Admitting: Rehabilitative and Restorative Service Providers"

## 2018-08-31 DIAGNOSIS — M6281 Muscle weakness (generalized): Secondary | ICD-10-CM | POA: Diagnosis not present

## 2018-08-31 DIAGNOSIS — R600 Localized edema: Secondary | ICD-10-CM

## 2018-08-31 DIAGNOSIS — M25561 Pain in right knee: Secondary | ICD-10-CM | POA: Diagnosis not present

## 2018-08-31 DIAGNOSIS — R2689 Other abnormalities of gait and mobility: Secondary | ICD-10-CM | POA: Diagnosis not present

## 2018-08-31 NOTE — Therapy (Signed)
[facility] [place], [place], [address] Phone: [phone]   Fax:  [phone]  Physical Therapy Treatment  Patient Details  Name: Judith [name] MRN: [id number] Date of Birth: 1971-03-04 Referring Provider (PT): Dr Levan Hurst    Encounter Date: 08/31/2018  PT End of Session - 08/31/18 1009    Visit Number  6    Number of Visits  18    Date for PT Re-Evaluation  10/12/18    PT Start Time  1006    PT Stop Time  1100    PT Time Calculation (min)  54 min    Activity Tolerance  Patient tolerated treatment well       Past Medical History:  Diagnosis Date  . Asthma   . Bipolar 1 disorder ([place])   . Depression   . GERD (gastroesophageal reflux disease)   . Hyperlipemia   . Hypothyroid   . Migraines     Past Surgical History:  Procedure Laterality Date  . ABDOMINAL SURGERY    . CARPAL TUNNEL RELEASE Bilateral 1999  . CESAREAN SECTION    . CHOLECYSTECTOMY    . THYROIDECTOMY    . TONSILLECTOMY      There were no vitals filed for this visit.  Subjective Assessment - 08/31/18 1011    Subjective  Patient reports that she has had a couple of rough days. She has incresaed stiffness and pain today. May be related to therapy and weather. Still working on her exercises.    Currently in Pain?  Yes    Pain Score  8     Pain Location  Knee    Pain Orientation  Right    Pain Descriptors / Indicators  Tightness;Throbbing         [facility] PT Assessment - 08/31/18 0001      Assessment   Medical Diagnosis  Rt TKA    Referring Provider (PT)  Dr Levan Hurst     Onset Date/Surgical Date  06/22/18    Hand Dominance  Right    Next MD Visit  to schedule     Prior Therapy  here for Lt TKA       AROM   Right Knee Extension  -7    Right Knee Flexion  120      PROM   Right Knee Extension  -4    Right Knee Flexion  124      Ambulation/Gait   Ambulation/Gait  Yes    Ambulation/Gait Assistance  7:  Independent    Ambulation Distance (Feet)  --   limited community distances    Assistive device  None    Gait Pattern  Step-through pattern;Decreased arm swing - left;Decreased step length - right;Right flexed knee in stance    Pre-Gait Activities  side steps and tandem walking 10 feet x 5 each     Gait Comments  VC for equal step length Lt, VC cues for reciprocal arm swing                   [facility] Adult PT Treatment/Exercise - 08/31/18 0001      Knee/Hip Exercises: Stretches   Passive Hamstring Stretch  Right;2 reps;30 seconds   foot on 12" step, overpressure from pt.    Quad Stretch  Right;3 reps;60 seconds   prone with strap    Gastroc Stretch  Both;3 reps;30 seconds   incline board.      Knee/Hip Exercises: Aerobic  Tread Mill  2.0 mph x 7 min level     Other Aerobic  laps around gym in between exercises to decrease stiffness.       Knee/Hip Exercises: Standing   Knee Flexion Limitations  alternate knee flexion to tap foot to 12 inch step x ~ 2 min     Hip Abduction  AROM;Stengthening;Right;Left;2 sets;10 reps;Knee straight   UE support on cabinet at shoulder height    Hip Extension  AAROM;Stengthening;Right;Left;2 sets;10 reps;Knee straight   UE support on cabinet at shoulder height    Other Standing Knee Exercises  retro tandem gait with light UE support on counter 10 ft x 5 reps       Knee/Hip Exercises: Seated   Other Seated Knee/Hip Exercises  knee flexion sitting scooting hips forward 20 sec x 5 resp     Marching  AROM;Right;Left;10 reps      Knee/Hip Exercises: Supine   Knee Flexion Limitations  rolling large green ball into hip and knee flexion x 2 min     Other Supine Knee/Hip Exercises  LE/trunk rotation side to side with ball ~ 1 min       Vasopneumatic   Number Minutes Vasopneumatic   10 minutes    Vasopnuematic Location   Knee   Rt    Vasopneumatic Pressure  Low    Vasopneumatic Temperature   34 deg      Kinesiotix   Edema  Regular  Rock  tape applied for scar management with zigzag pattern    Inhibit Muscle   I strip of Regular Rock tape applied with 15% stretch applied to Rt lateral knee to prox shin; small perpendicular strip applied lateral to patella with 50% stretch to decompress tissue and decrease pain.                    PT Long Term Goals - 08/31/18 1058      PT LONG TERM GOAL #1   Title  Increase ROM Rt knee to 0 deg extension - 125 deg flexion 10/12/2018    Time  10    Period  Weeks    Status  Revised      PT LONG TERM GOAL #2   Title  5/5 strength Rt LE 10/12/2018    Time  10    Period  Weeks    Status  Revised      PT LONG TERM GOAL #3   Title  Patient tolerating 30 min of standing and or walking 10/12/2018    Time  10    Period  Weeks    Status  Revised      PT LONG TERM GOAL #4   Title  Independent in HEP 10/12/2018    Time  10    Period  Weeks    Status  Revised      PT LONG TERM GOAL #5   Title  Improve FOTO to </= 43% limitation 10/12/2018    Time  10    Period  Weeks    Status  Revised            Plan - 08/31/18 1011    Clinical Impression Statement  Patient presents today with increased pain in Rt knee and hip. Continued with modified exerises which were tolerated well. Patient continues to progress well toward stated goals of rehab and will benefit from additional PT to achieve maximum rehab potential.     Rehab Potential  Excellent    PT  Frequency  2x / week    PT Duration  4 weeks    PT Treatment/Interventions  Patient/family education;ADLs/Self Care Home Management;Cryotherapy;Electrical Stimulation;Iontophoresis 4mg /ml Dexamethasone;Moist Heat;Ultrasound;Dry needling;Manual techniques;Gait training;Stair training;Functional mobility training;Therapeutic activities;Therapeutic exercise;Balance training;Neuromuscular re-education;Taping    PT Next Visit Plan  continue aggressive ROM, progressive strengthening, gait training.     Consulted and Agree with Plan of Care   Patient       Patient will benefit from skilled therapeutic intervention in order to improve the following deficits and impairments:  Increased muscle spasms, Increased fascial restricitons, Decreased mobility, Decreased range of motion, Abnormal gait, Decreased activity tolerance, Decreased endurance, Decreased strength, Pain  Visit Diagnosis: Acute pain of right knee - Plan: PT plan of care cert/re-cert  Muscle weakness (generalized) - Plan: PT plan of care cert/re-cert  Localized edema - Plan: PT plan of care cert/re-cert  Other abnormalities of gait and mobility - Plan: PT plan of care cert/re-cert     Problem List Patient Active Problem List   Diagnosis Date Noted  . Acute cystitis without hematuria 03/21/2018  . History of left knee replacement 02/10/2018  . Morbid obesity ([place]) 11/21/2017  . Primary osteoarthritis of left knee 09/22/2017  . Primary osteoarthritis of right knee 09/22/2017  . Attention deficit hyperactivity disorder (ADHD) 05/12/2016  . Episodic mood disorder ([place]) 05/12/2016  . Type 2 diabetes mellitus without complication, without long-term current use of insulin ([place]) 05/12/2016  . Gastroesophageal reflux disease without esophagitis 05/12/2016  . DYSPNEA 07/22/2010  . CHEST PAIN 07/22/2010  . Hypothyroidism 09/13/2007  . Hyperlipidemia 09/13/2007  . ANXIETY DEPRESSION 09/13/2007  . MIGRAINE HEADACHE 09/13/2007  . Essential hypertension 09/13/2007  . ASTHMA 09/13/2007  . CHOLELITHIASIS, HX OF 09/13/2007  . NEPHROLITHIASIS, HX OF 09/13/2007  . CARPAL TUNNEL RELEASE, HX OF 09/13/2007    Judith [name] Nilda Simmer PT, MPH  08/31/2018, 11:04 AM  [facility] [place], [place], [address] Phone: [phone]   Fax:  [phone]  Name: Judith [name] MRN: [id number] Date of Birth: 1971-02-22

## 2018-08-31 NOTE — Progress Notes (Signed)
Patient resolved from Transition of Care episode on 08/31/2018  Patient/family has been provided the following resources and education related to COVID-19:                         Signs, symptoms and red flags related to COVID-19            CDC exposure and quarantine guidelines            [place] exposure contact - [phone]            Contact for their local Department of Health                 Patient currently reports that the following symptoms have improved:  no new/worsening symptoms     No further outreach scheduled with this CTN/ACM.  Episode of Care resolved.  Patient has this CTN/ACM contact information if future needs arise.

## 2018-08-31 NOTE — Progress Notes (Signed)
Patient resolved from Transition of Care episode on 08/31/2018  Patient/family has been provided the following resources and education related to COVID-19:                         Signs, symptoms and red flags related to COVID-19            CDC exposure and quarantine guidelines            [place] exposure contact - [phone]            Contact for their local Department of Health                 Patient currently reports that the following symptoms have improved:  no new/worsening symptoms     No further outreach scheduled with this CTN/ACM.  Episode of Care resolved.  Patient has this CTN/ACM contact information if future needs arise.

## 2018-09-05 ENCOUNTER — Encounter: Payer: Self-pay | Admitting: Physical Therapy

## 2018-09-07 ENCOUNTER — Encounter: Payer: Self-pay | Admitting: Rehabilitative and Restorative Service Providers"

## 2018-09-07 ENCOUNTER — Ambulatory Visit (INDEPENDENT_AMBULATORY_CARE_PROVIDER_SITE_OTHER): Payer: BLUE CROSS/BLUE SHIELD | Admitting: Rehabilitative and Restorative Service Providers"

## 2018-09-07 ENCOUNTER — Other Ambulatory Visit: Payer: Self-pay

## 2018-09-07 DIAGNOSIS — M6281 Muscle weakness (generalized): Secondary | ICD-10-CM | POA: Diagnosis not present

## 2018-09-07 DIAGNOSIS — R2689 Other abnormalities of gait and mobility: Secondary | ICD-10-CM | POA: Diagnosis not present

## 2018-09-07 DIAGNOSIS — M25561 Pain in right knee: Secondary | ICD-10-CM

## 2018-09-07 DIAGNOSIS — R600 Localized edema: Secondary | ICD-10-CM | POA: Diagnosis not present

## 2018-09-07 NOTE — Therapy (Signed)
[place], [place], [address] Phone: [phone]   Fax:  [phone]  Physical Therapy Treatment  Patient Details  Name: Judith [name] MRN: [id number] Date of Birth: [birth year] Referring Provider (PT): Dr Levan Hurst    Encounter Date: 09/07/2018  PT End of Session - 09/07/18 1107    Visit Number  7    Number of Visits  18    Date for PT Re-Evaluation  10/12/18    PT Start Time  1102    PT Stop Time  1200    PT Time Calculation (min)  58 min    Activity Tolerance  Patient tolerated treatment well       Past Medical History:  Diagnosis Date  . Asthma   . Bipolar 1 disorder ([place])   . Depression   . GERD (gastroesophageal reflux disease)   . Hyperlipemia   . Hypothyroid   . Migraines     Past Surgical History:  Procedure Laterality Date  . ABDOMINAL SURGERY    . CARPAL TUNNEL RELEASE Bilateral 1999  . CESAREAN SECTION    . CHOLECYSTECTOMY    . THYROIDECTOMY    . TONSILLECTOMY      There were no vitals filed for this visit.  Subjective Assessment - 09/07/18 1108    Subjective  Continuing to work on exercises at home. Not bad today. Notices a little stiffness.     Currently in Pain?  Yes    Pain Score  3     Pain Location  Knee    Pain Orientation  Right    Pain Descriptors / Indicators  Tightness   stiffness    Pain Type  Chronic pain         [facility] PT Assessment - 09/07/18 0001      Assessment   Medical Diagnosis  Rt TKA    Referring Provider (PT)  Dr Levan Hurst     Onset Date/Surgical Date  06/22/18    Hand Dominance  Right    Next MD Visit  to schedule     Prior Therapy  here for Lt TKA       PROM   Right Knee Extension  -4    Right Knee Flexion  125                   [facility] Adult PT Treatment/Exercise - 09/07/18 0001      Knee/Hip Exercises: Stretches   Passive Hamstring Stretch  Right;2 reps;30 seconds   foot on 12" step,  overpressure from pt.    Quad Stretch  Right;3 reps;60 seconds   prone with strap    ITB Stretch  Right;2 reps;30 seconds   supine with strap    Gastroc Stretch  Both;3 reps;30 seconds   incline board.      Knee/Hip Exercises: Aerobic   Tread Mill  2.5 mph x 5 min flat at 10% incline then flat     Other Aerobic  laps around gym in between exercises to decrease stiffness.       Knee/Hip Exercises: Standing   Lateral Step Up  Right;15 reps;Hand Hold: 0;Step Height: 8"    Other Standing Knee Exercises  standing TKE w/ black TB     Other Standing Knee Exercises  standing TKE isometric pressing back of knee into small orange ball 10 sec hold x 10       Knee/Hip Exercises: Supine   Quad Sets  AROM;Strengthening;Right;10  reps   10 sec hold heel on foam roll    Short Arc Quad Sets  Strengthening;Right;10 reps   10 sec hold - LE supported on round bolster    Bridges  Strengthening;Both;15 reps   10 sec hold    Straight Leg Raises  AROM;Strengthening;Right;10 reps   10 sec hold ~ 12 inches    Straight Leg Raise with External Rotation  AROM;Strengthening;Right;10 reps   5 sec hold ~ 12 inch lift    Knee Flexion Limitations  rolling large green ball into hip and knee flexion x 2 min     Other Supine Knee/Hip Exercises  LE/trunk rotation side to side with ball ~ 1 min       Moist Heat Therapy   Number Minutes Moist Heat  15 Minutes    Moist Heat Location  Hip   Rt      Vasopneumatic   Number Minutes Vasopneumatic   15 minutes    Vasopnuematic Location   Knee   Rt    Vasopneumatic Pressure  Low    Vasopneumatic Temperature   34 deg      Manual Therapy   Passive ROM  overpressure into Rt knee ext x 20 sec x 3 reps              PT Education - 09/07/18 1140    Education Details  HEP     Person(s) Educated  Patient    Methods  Explanation;Demonstration;Tactile cues;Verbal cues;Handout    Comprehension  Verbalized understanding;Returned demonstration;Verbal cues  required;Tactile cues required          PT Long Term Goals - 08/31/18 1058      PT LONG TERM GOAL #1   Title  Increase ROM Rt knee to 0 deg extension - 125 deg flexion 10/12/2018    Time  10    Period  Weeks    Status  Revised      PT LONG TERM GOAL #2   Title  5/5 strength Rt LE 10/12/2018    Time  10    Period  Weeks    Status  Revised      PT LONG TERM GOAL #3   Title  Patient tolerating 30 min of standing and or walking 10/12/2018    Time  10    Period  Weeks    Status  Revised      PT LONG TERM GOAL #4   Title  Independent in HEP 10/12/2018    Time  10    Period  Weeks    Status  Revised      PT LONG TERM GOAL #5   Title  Improve FOTO to </= 43% limitation 10/12/2018    Time  10    Period  Weeks    Status  Revised            Plan - 09/07/18 1107    Clinical Impression Statement  Continued progress with less pain reported today. Patient has also increased activity level. She demonstrates good knee flexion but continues to have (-)4 degree knee extension. Worked today on knee extension strenngth in TKE and PROM into knee extension. Patient was encouraged to work on the knee extension activities at home and work on some activity to straighten knee at least 3-4 times per day. Patient will benefit from continue PT to reach maximum rehab potential and achieve RTW status.     Rehab Potential  Excellent    PT Frequency  2x /  week    PT Duration  4 weeks    PT Treatment/Interventions  Patient/family education;ADLs/Self Care Home Management;Cryotherapy;Electrical Stimulation;Iontophoresis 4mg /ml Dexamethasone;Moist Heat;Ultrasound;Dry needling;Manual techniques;Gait training;Stair training;Functional mobility training;Therapeutic activities;Therapeutic exercise;Balance training;Neuromuscular re-education;Taping    PT Next Visit Plan  continue ROM, progressive strengthening, gait training - focus on knee extension     PT Home Exercise Plan  Access Code: [email or web address]      Consulted and Agree with Plan of Care  Patient       Patient will benefit from skilled therapeutic intervention in order to improve the following deficits and impairments:  Increased muscle spasms, Increased fascial restricitons, Decreased mobility, Decreased range of motion, Abnormal gait, Decreased activity tolerance, Decreased endurance, Decreased strength, Pain  Visit Diagnosis: Acute pain of right knee  Muscle weakness (generalized)  Localized edema  Other abnormalities of gait and mobility     Problem List Patient Active Problem List   Diagnosis Date Noted  . Acute cystitis without hematuria 03/21/2018  . History of left knee replacement 02/10/2018  . Morbid obesity ([place]) 11/21/2017  . Primary osteoarthritis of left knee 09/22/2017  . Primary osteoarthritis of right knee 09/22/2017  . Attention deficit hyperactivity disorder (ADHD) 05/12/2016  . Episodic mood disorder ([place]) 05/12/2016  . Type 2 diabetes mellitus without complication, without long-term current use of insulin ([place]) 05/12/2016  . Gastroesophageal reflux disease without esophagitis 05/12/2016  . DYSPNEA 07/22/2010  . CHEST PAIN 07/22/2010  . Hypothyroidism 09/13/2007  . Hyperlipidemia 09/13/2007  . ANXIETY DEPRESSION 09/13/2007  . MIGRAINE HEADACHE 09/13/2007  . Essential hypertension 09/13/2007  . ASTHMA 09/13/2007  . CHOLELITHIASIS, HX OF 09/13/2007  . NEPHROLITHIASIS, HX OF 09/13/2007  . CARPAL TUNNEL RELEASE, HX OF 09/13/2007    Jewett Mcgann Nilda Simmer PT, MPH  09/07/2018, 11:57 AM  [facility] [place], [place], [address] Phone: [phone]   Fax:  [phone]  Name: Judith [name] MRN: [id number] Date of Birth: Sep 23, 1970

## 2018-09-07 NOTE — Patient Instructions (Signed)
Access Code: [email or web address]  URL: [email or web address]  Date: 09/07/2018  Prepared by: Gillermo Murdoch   Exercises  Standing Terminal Knee Extension with Resistance - 10 reps - 1-2 sets - 10 sec hold - 2x daily - 7x weekly  Standing Terminal Knee Extension at Wall with Ball - 10 reps - 1-2 sets - 10 sec hold - 2x daily - 7x weekly

## 2018-09-09 ENCOUNTER — Encounter: Payer: Self-pay | Admitting: Physical Therapy

## 2018-09-13 ENCOUNTER — Ambulatory Visit (INDEPENDENT_AMBULATORY_CARE_PROVIDER_SITE_OTHER): Payer: BLUE CROSS/BLUE SHIELD | Admitting: Physical Therapy

## 2018-09-13 ENCOUNTER — Other Ambulatory Visit: Payer: Self-pay

## 2018-09-13 DIAGNOSIS — M25561 Pain in right knee: Secondary | ICD-10-CM

## 2018-09-13 DIAGNOSIS — R2689 Other abnormalities of gait and mobility: Secondary | ICD-10-CM | POA: Diagnosis not present

## 2018-09-13 DIAGNOSIS — R600 Localized edema: Secondary | ICD-10-CM

## 2018-09-13 DIAGNOSIS — M6281 Muscle weakness (generalized): Secondary | ICD-10-CM | POA: Diagnosis not present

## 2018-09-13 NOTE — Therapy (Signed)
[place], [place], [address] Phone: [phone]   Fax:  [phone]  Physical Therapy Treatment  Patient Details  Name: Judith [name] MRN: [id number] Date of Birth: 10-21-1970 Referring Provider (PT): Dr Levan Hurst    Encounter Date: 09/13/2018  PT End of Session - 09/13/18 1204    Visit Number  8    Number of Visits  18    Date for PT Re-Evaluation  10/12/18    PT Start Time  1105    PT Stop Time  1200    PT Time Calculation (min)  55 min    Activity Tolerance  Patient tolerated treatment well    Behavior During Therapy  [facility] for tasks assessed/performed       Past Medical History:  Diagnosis Date  . Asthma   . Bipolar 1 disorder ([place])   . Depression   . GERD (gastroesophageal reflux disease)   . Hyperlipemia   . Hypothyroid   . Migraines     Past Surgical History:  Procedure Laterality Date  . ABDOMINAL SURGERY    . CARPAL TUNNEL RELEASE Bilateral 1999  . CESAREAN SECTION    . CHOLECYSTECTOMY    . THYROIDECTOMY    . TONSILLECTOMY      There were no vitals filed for this visit.  Subjective Assessment - 09/13/18 1111    Subjective  Pt reports her knee pain was an 8/10 the other day, and that's why she missed her last therapy appt.  She believes the weather may have played a role.  She complains that she couldn't straighten her Rt knee very well.  She returns to work 09/30/18.     Currently in Pain?  Yes    Pain Score  5     Pain Location  Knee    Pain Orientation  Right    Pain Descriptors / Indicators  Tightness;Aching    Aggravating Factors   prolonged positions    Pain Relieving Factors  ice, medication          [facility] PT Assessment - 09/13/18 0001      Assessment   Medical Diagnosis  Rt TKA    Referring Provider (PT)  Dr Levan Hurst     Onset Date/Surgical Date  06/22/18    Hand Dominance  Right    Next MD Visit  to schedule     Prior Therapy  here for  Lt TKA       AROM   Right Knee Extension  -7      Flexibility   Quadriceps  Rt 108 deg.         [facility] Adult PT Treatment/Exercise - 09/13/18 0001      Knee/Hip Exercises: Stretches   Quad Stretch  Right;4 reps;30 seconds   prone with strap    Gastroc Stretch  Both;3 reps;30 seconds   incline board.    Other Knee/Hip Stretches  Rt hamstring stretch (with overpressure into thigh) with foot propped up on 12" step x 30 sec x 2 reps;  Rt forward lunge on 12" step x 10 reps, held 10 seconds (to increased Rt knee flexion)    Other Knee/Hip Stretches  supine,feet on green swiss ball, rolling ball in/out for ROM of knees x 10 (5 sec pause in each direction )      Knee/Hip Exercises: Aerobic   Tread Mill  2.3-2.5 mph x 5.5 min, 0% incline     Other Aerobic  laps around gym in between exercises to decrease stiffness.       Knee/Hip Exercises: Standing   Forward Step Up Limitations  Rt step up onto ladder x 5 reps, (12" step)     Functional Squat  10 reps   squatting and lifting 15# box from floor (holding handles)     Knee/Hip Exercises: Supine   Heel Slides  AAROM;Right;1 set;5 reps   10 sec holds, strap assist     Knee/Hip Exercises: Prone   Other Prone Exercises  quad set prone straightening knee with toe down 5 sec hold x 10       Moist Heat Therapy   Number Minutes Moist Heat  15 Minutes    Moist Heat Location  Lumbar Spine      Vasopneumatic   Number Minutes Vasopneumatic   15 minutes    Vasopnuematic Location   Knee   Rt    Vasopneumatic Pressure  Low    Vasopneumatic Temperature   34 deg      Manual Therapy   Manual therapy comments  attempt to tape knee; tape would not adhere; sent tape home with pt to apply after shower.     Passive ROM  overpressure into Rt knee ext x 20 sec x 3 reps                   PT Long Term Goals - 08/31/18 1058      PT LONG TERM GOAL #1   Title  Increase ROM Rt knee to 0 deg extension - 125 deg flexion 10/12/2018    Time  10     Period  Weeks    Status  Revised      PT LONG TERM GOAL #2   Title  5/5 strength Rt LE 10/12/2018    Time  10    Period  Weeks    Status  Revised      PT LONG TERM GOAL #3   Title  Patient tolerating 30 min of standing and or walking 10/12/2018    Time  10    Period  Weeks    Status  Revised      PT LONG TERM GOAL #4   Title  Independent in HEP 10/12/2018    Time  10    Period  Weeks    Status  Revised      PT LONG TERM GOAL #5   Title  Improve FOTO to </= 43% limitation 10/12/2018    Time  10    Period  Weeks    Status  Revised            Plan - 09/13/18 1155    Clinical Impression Statement  Continued report of stiffness in knee, both at home and during session.  Pt demonstrated improved Rt quad flexibility today.  Rt knee ext similar to last assessment.  Pt was able to tolerate ascending/descending step ladder with little difficulty or increase in discomfort.  Will begin to add more work related exercises into sessions to prepare for return to work.  Progressing gradually towards remaining goals.     Rehab Potential  Excellent    PT Frequency  2x / week    PT Duration  4 weeks    PT Treatment/Interventions  Patient/family education;ADLs/Self Care Home Management;Cryotherapy;Electrical Stimulation;Iontophoresis 4mg /ml Dexamethasone;Moist Heat;Ultrasound;Dry needling;Manual techniques;Gait training;Stair training;Functional mobility training;Therapeutic activities;Therapeutic exercise;Balance training;Neuromuscular re-education;Taping    PT Next Visit Plan  trial of retro stair descending holding package (to  simulate work activity); continued focus on ROM and functional strengthening.     PT Home Exercise Plan  Access Code: [email or web address]     Consulted and Agree with Plan of Care  Patient       Patient will benefit from skilled therapeutic intervention in order to improve the following deficits and impairments:  Increased muscle spasms, Increased fascial restricitons,  Decreased mobility, Decreased range of motion, Abnormal gait, Decreased activity tolerance, Decreased endurance, Decreased strength, Pain  Visit Diagnosis: Acute pain of right knee  Muscle weakness (generalized)  Localized edema  Other abnormalities of gait and mobility     Problem List Patient Active Problem List   Diagnosis Date Noted  . Acute cystitis without hematuria 03/21/2018  . History of left knee replacement 02/10/2018  . Morbid obesity ([place]) 11/21/2017  . Primary osteoarthritis of left knee 09/22/2017  . Primary osteoarthritis of right knee 09/22/2017  . Attention deficit hyperactivity disorder (ADHD) 05/12/2016  . Episodic mood disorder ([place]) 05/12/2016  . Type 2 diabetes mellitus without complication, without long-term current use of insulin ([place]) 05/12/2016  . Gastroesophageal reflux disease without esophagitis 05/12/2016  . DYSPNEA 07/22/2010  . CHEST PAIN 07/22/2010  . Hypothyroidism 09/13/2007  . Hyperlipidemia 09/13/2007  . ANXIETY DEPRESSION 09/13/2007  . MIGRAINE HEADACHE 09/13/2007  . Essential hypertension 09/13/2007  . ASTHMA 09/13/2007  . CHOLELITHIASIS, HX OF 09/13/2007  . NEPHROLITHIASIS, HX OF 09/13/2007  . CARPAL TUNNEL RELEASE, HX OF 09/13/2007   Kerin Perna, PTA 09/13/18 12:07 PM  [place] [place], [place], [address] Phone: [phone]   Fax:  [phone]  Name: Judith [name] MRN: [id number] Date of Birth: 01-16-1971

## 2018-09-14 ENCOUNTER — Inpatient Hospital Stay
Admit: 2018-09-14 | Discharge: 2018-09-14 | Payer: PRIVATE HEALTH INSURANCE | Attending: Rehabilitative and Restorative Service Providers" | Primary: Family Medicine

## 2018-09-14 NOTE — Progress Notes (Signed)
PT DAILY TREATMENT NOTE 2-15    Patient Name: Sara Cooke  Date:09/14/2018  DOB: [birth year]  [x]   Patient DOB Verified  Payor: [employer] [place] / Plan: [employer] / Product Type: Commerical /    In time:310  Out time:415  Total Treatment Time (min): 65  Visit #:  27    Treatment Area: No admission diagnoses are documented for this encounter.    Sara Cooke was informed of the inherent limitations of a virtual visit,  and has consented to a virtual therapy visit on 06/06/2019.  Information regarding emergency contact information for this patient during this visit is to contact:  Haywood Pao at [phone] in addition to calling [phone].  The patient was informed that at any time during the virtual visit, they can decide to stop the virtual visit.  The patient verified that they are physically located in the state of [place] for this virtual visit.    SUBJECTIVE  Pain Level (0-10 scale): 0  Any medication changes, allergies to medications, adverse drug reactions, diagnosis change, or new procedure performed?: [x]  No    []  Yes (see summary sheet for update)  Subjective functional status/changes:   []  No changes reported  Pt states that she is doing pretty good with the HEP, but work has been hectic    OBJECTIVE    65 min Therapeutic Exercise:  [x]  See flow sheet :   Rationale: increase ROM, increase strength and improve coordination to improve the patient???s ability to complete all activity          With   [x]  TE   []  TA   []  Neuro   []  SC   []  other: Patient Education: [x]  Review HEP    [x]  Progressed/Changed HEP based on:   [x]  positioning   [x]  body mechanics   []  transfers   []  heat/ice application    [x]  other:      Pain Level (0-10 scale) post treatment: 0    ASSESSMENT/Changes in Function:    PT is doing very well with her HEP.  She demonstrates an understanding and has space laid out in her home for her to complete her routine daily.  She is demonstrating improved strength and her form is improved.  She still lacks some end range motion but as she continues to strengthen and use the arm this should also see greater progress  Patient will continue to benefit from skilled PT services to modify and progress therapeutic interventions, address functional mobility deficits, address ROM deficits, address strength deficits, analyze and address soft tissue restrictions, analyze and cue movement patterns, analyze and modify body mechanics/ergonomics, assess and modify postural abnormalities and address imbalance/dizziness to attain remaining goals.     []   See Plan of Care  []   See progress note/recertification  []   See Discharge Summary         Progress towards goals / Updated goals:  Progressing toward treatment goals    PLAN  [x]   Upgrade activities as tolerated     [x]   Continue plan of care  [x]   Update interventions per flow sheet       []   Discharge due to:_  []   Other:_        Sara Cooke is a 49 y.o. female being evaluated by a Virtual Visit (video visit) encounter to address concerns as mentioned above.  A caregiver was present when appropriate. Due to this being a  TeleHealth encounter (During COVID-19 public health emergency), evaluation of the following areas was limited: Direct tissue palpation, direct goniometric measurements, blood pressure, O2 saturation. Pursuant to the emergency declaration under the [facility] Act and the [employer] Emergencies Act, 1135 waiver authority and the [employer]onavirus Preparedness and [employer] Supplemental Appropriations Act, this Virtual Visit was conducted with patient's (and/or legal guardian's) consent, to reduce the risk of exposure to COVID-19 and provide necessary medical care.       Services were provided through a video synchronous discussion virtually to substitute for in-person encounter.    --Glenis Smokeravid M Masayuki Sakai, PT on 09/14/2018 at 7:19 AM    An electronic signature was used to authenticate this note.

## 2018-09-14 NOTE — Progress Notes (Signed)
 PT DAILY TREATMENT NOTE 2-15    Patient Name: Sara Cooke  Date:09/14/2018  DOB: 18-Aug-1970  [x]   Patient DOB Verified  Payor: [employer] [place]  / Plan: [employer] / Product Type: Commerical /    In time:310  Out time:415  Total Treatment Time (min): 65  Visit #:  27    Treatment Area: No admission diagnoses are documented for this encounter.    Abigaelle A Dohn was informed of the inherent limitations of a virtual visit,  and has consented to a virtual therapy visit on 06/06/2019.  Information regarding emergency contact information for this patient during this visit is to contact:  [name] at [phone] in addition to calling 911.  The patient was informed that at any time during the virtual visit, they can decide to stop the virtual visit.  The patient verified that they are physically located in the state of [place]  for this virtual visit.    SUBJECTIVE  Pain Level (0-10 scale): 0  Any medication changes, allergies to medications, adverse drug reactions, diagnosis change, or new procedure performed?: [x]  No    []  Yes (see summary sheet for update)  Subjective functional status/changes:   []  No changes reported  Pt states that she is doing pretty good with the HEP, but work has been hectic    OBJECTIVE    65 min Therapeutic Exercise:  [x]  See flow sheet :   Rationale: increase ROM, increase strength and improve coordination to improve the patient's ability to complete all activity          With   [x]  TE   []  TA   []  Neuro   []  SC   []  other: Patient Education: [x]  Review HEP    [x]  Progressed/Changed HEP based on:   [x]  positioning   [x]  body mechanics   []  transfers   []  heat/ice application    [x]  other:      Pain Level (0-10 scale) post treatment: 0    ASSESSMENT/Changes in Function:   PT is doing very well with her HEP.  She demonstrates an understanding and has space laid out in her home for her to complete her routine daily.  She is demonstrating improved strength  and her form is improved.  She still lacks some end range motion but as she continues to strengthen and use the arm this should also see greater progress  Patient will continue to benefit from skilled PT services to modify and progress therapeutic interventions, address functional mobility deficits, address ROM deficits, address strength deficits, analyze and address soft tissue restrictions, analyze and cue movement patterns, analyze and modify body mechanics/ergonomics, assess and modify postural abnormalities and address imbalance/dizziness to attain remaining goals.     []   See Plan of Care  []   See progress note/recertification  []   See Discharge Summary         Progress towards goals / Updated goals:  Progressing toward treatment goals    PLAN  [x]   Upgrade activities as tolerated     [x]   Continue plan of care  [x]   Update interventions per flow sheet       []   Discharge due to:_  []   Other:_        Shalaya A Frie is a 48 y.o. female being evaluated by a Virtual Visit (video visit) encounter to address concerns as mentioned above.  A caregiver was present when appropriate. Due to this being a  TeleHealth encounter (During COVID-19 public health emergency), evaluation of the following areas was limited: Direct tissue palpation, direct goniometric measurements, blood pressure, O2 saturation. Pursuant to the emergency declaration under the [facility] Act and the [employer], 1135 waiver authority and the [employer] and [employer] Act, this Virtual Visit was conducted with patient's (and/or legal guardian's) consent, to reduce the risk of exposure to COVID-19 and provide necessary medical care.      Services were provided through a video synchronous discussion virtually to substitute for in-person encounter.    --Alm [name], PT on 09/14/2018 at 7:19 AM    An electronic signature was used to authenticate this note.

## 2018-09-15 ENCOUNTER — Encounter: Payer: Self-pay | Admitting: Rehabilitative and Restorative Service Providers"

## 2018-09-15 ENCOUNTER — Ambulatory Visit (INDEPENDENT_AMBULATORY_CARE_PROVIDER_SITE_OTHER): Payer: BLUE CROSS/BLUE SHIELD | Admitting: Rehabilitative and Restorative Service Providers"

## 2018-09-15 ENCOUNTER — Other Ambulatory Visit: Payer: Self-pay

## 2018-09-15 DIAGNOSIS — R600 Localized edema: Secondary | ICD-10-CM | POA: Diagnosis not present

## 2018-09-15 DIAGNOSIS — M25561 Pain in right knee: Secondary | ICD-10-CM | POA: Diagnosis not present

## 2018-09-15 DIAGNOSIS — M6281 Muscle weakness (generalized): Secondary | ICD-10-CM

## 2018-09-15 DIAGNOSIS — R2689 Other abnormalities of gait and mobility: Secondary | ICD-10-CM | POA: Diagnosis not present

## 2018-09-15 NOTE — Therapy (Signed)
[place], [place], [address] Phone: [phone]   Fax:  [phone]  Physical Therapy Treatment  Patient Details  Name: Judith [name] MRN: [id number] Date of Birth: May 29, 1970 Referring Provider (PT): Dr Levan Hurst    Encounter Date: 09/15/2018  PT End of Session - 09/15/18 1112    Visit Number  9    Number of Visits  18    Date for PT Re-Evaluation  10/12/18    PT Start Time  1104    PT Stop Time  1200    PT Time Calculation (min)  56 min    Activity Tolerance  Patient tolerated treatment well       Past Medical History:  Diagnosis Date  . Asthma   . Bipolar 1 disorder ([place])   . Depression   . GERD (gastroesophageal reflux disease)   . Hyperlipemia   . Hypothyroid   . Migraines     Past Surgical History:  Procedure Laterality Date  . ABDOMINAL SURGERY    . CARPAL TUNNEL RELEASE Bilateral 1999  . CESAREAN SECTION    . CHOLECYSTECTOMY    . THYROIDECTOMY    . TONSILLECTOMY      There were no vitals filed for this visit.  Subjective Assessment - 09/15/18 1113    Subjective  Patient reports that she was better until yesterday - incresaed pain with the cooler weather and rain. Needs to work on her strength so she can return to work     Pain Score  5                        [facility] Adult PT Treatment/Exercise - 09/15/18 0001      Knee/Hip Exercises: Stretches   Passive Hamstring Stretch  3 reps;30 seconds   supine with strap    Passive Hamstring Stretch Limitations  seated HS stretch 30 sec x 3     Gastroc Stretch  Both;3 reps;30 seconds   incline board.      Knee/Hip Exercises: Aerobic   Elliptical  2 min no resistance - 1.5 min fwd; 30 sec back     Tread Mill  2.3-2.5 mph x 3 min, 0% incline     Recumbent Bike  4 min       Knee/Hip Exercises: Standing   Lateral Step Up  Right;2 sets;10 reps;Step Height: 6";Hand Hold: 0    Forward Step Up  Right;2  sets;10 reps;Step Height: 6";Hand Hold: 0    Wall Squat  5 reps;10 seconds   10-20 sec hold    SLS  SLS with forward lean to touch table - x 10    SLS with Vectors  SLS 20-30 sec while moving body blade 30 sec x 5     Other Standing Knee Exercises  climbing one step on ladder with Lt UE support at back of truck x 10 reps    Other Standing Knee Exercises  stepping up and down stairs with 10 # weight       Moist Heat Therapy   Number Minutes Moist Heat  15 Minutes    Moist Heat Location  Lumbar Spine      Vasopneumatic   Number Minutes Vasopneumatic   15 minutes    Vasopnuematic Location   Knee   Rt    Vasopneumatic Pressure  Low    Vasopneumatic Temperature   34 deg  PT Long Term Goals - 08/31/18 1058      PT LONG TERM GOAL #1   Title  Increase ROM Rt knee to 0 deg extension - 125 deg flexion 10/12/2018    Time  10    Period  Weeks    Status  Revised      PT LONG TERM GOAL #2   Title  5/5 strength Rt LE 10/12/2018    Time  10    Period  Weeks    Status  Revised      PT LONG TERM GOAL #3   Title  Patient tolerating 30 min of standing and or walking 10/12/2018    Time  10    Period  Weeks    Status  Revised      PT LONG TERM GOAL #4   Title  Independent in HEP 10/12/2018    Time  10    Period  Weeks    Status  Revised      PT LONG TERM GOAL #5   Title  Improve FOTO to </= 43% limitation 10/12/2018    Time  10    Period  Weeks    Status  Revised            Plan - 09/15/18 1112    Clinical Impression Statement  Worked on strengthening with functional activities with no significant difficulty. She did fatique with exercise requiring periods of rest. She continues to require additional PT to achieve return to work status.     Rehab Potential  Excellent    PT Frequency  2x / week    PT Duration  4 weeks    PT Treatment/Interventions  Patient/family education;ADLs/Self Care Home Management;Cryotherapy;Electrical Stimulation;Iontophoresis  4mg /ml Dexamethasone;Moist Heat;Ultrasound;Dry needling;Manual techniques;Gait training;Stair training;Functional mobility training;Therapeutic activities;Therapeutic exercise;Balance training;Neuromuscular re-education;Taping    PT Next Visit Plan  continue to simulate work activities with functional strengthening; continued focus on ROM and functional strengthening.     PT Home Exercise Plan  Access Code: [phone]     Consulted and Agree with Plan of Care  Patient       Patient will benefit from skilled therapeutic intervention in order to improve the following deficits and impairments:  Increased muscle spasms, Increased fascial restricitons, Decreased mobility, Decreased range of motion, Abnormal gait, Decreased activity tolerance, Decreased endurance, Decreased strength, Pain  Visit Diagnosis: Acute pain of right knee  Muscle weakness (generalized)  Localized edema  Other abnormalities of gait and mobility     Problem List Patient Active Problem List   Diagnosis Date Noted  . Acute cystitis without hematuria 03/21/2018  . History of left knee replacement 02/10/2018  . Morbid obesity ([place]) 11/21/2017  . Primary osteoarthritis of left knee 09/22/2017  . Primary osteoarthritis of right knee 09/22/2017  . Attention deficit hyperactivity disorder (ADHD) 05/12/2016  . Episodic mood disorder ([place]) 05/12/2016  . Type 2 diabetes mellitus without complication, without long-term current use of insulin ([place]) 05/12/2016  . Gastroesophageal reflux disease without esophagitis 05/12/2016  . DYSPNEA 07/22/2010  . CHEST PAIN 07/22/2010  . Hypothyroidism 09/13/2007  . Hyperlipidemia 09/13/2007  . ANXIETY DEPRESSION 09/13/2007  . MIGRAINE HEADACHE 09/13/2007  . Essential hypertension 09/13/2007  . ASTHMA 09/13/2007  . CHOLELITHIASIS, HX OF 09/13/2007  . NEPHROLITHIASIS, HX OF 09/13/2007  . CARPAL TUNNEL RELEASE, HX OF 09/13/2007    Desa Rech Nilda Simmer PT, MPH  09/15/2018, 12:19 PM  [facility] [place], [place], [address]  Phone: [phone]   Fax:  [phone]  Name: Judith [name] MRN: [id number] Date of Birth: 1970-07-04

## 2018-09-20 ENCOUNTER — Encounter: Payer: Self-pay | Admitting: Physical Therapy

## 2018-09-20 ENCOUNTER — Other Ambulatory Visit: Payer: Self-pay

## 2018-09-20 ENCOUNTER — Ambulatory Visit (INDEPENDENT_AMBULATORY_CARE_PROVIDER_SITE_OTHER): Payer: BLUE CROSS/BLUE SHIELD | Admitting: Physical Therapy

## 2018-09-20 DIAGNOSIS — R600 Localized edema: Secondary | ICD-10-CM

## 2018-09-20 DIAGNOSIS — M25561 Pain in right knee: Secondary | ICD-10-CM

## 2018-09-20 DIAGNOSIS — R2689 Other abnormalities of gait and mobility: Secondary | ICD-10-CM

## 2018-09-20 DIAGNOSIS — M6281 Muscle weakness (generalized): Secondary | ICD-10-CM | POA: Diagnosis not present

## 2018-09-20 NOTE — Therapy (Signed)
[place], [place], [address] Phone: [phone]   Fax:  [phone]  Physical Therapy Treatment  Patient Details  Name: Judith [name] MRN: [id number] Date of Birth: [birth year] Referring Provider (PT): Dr Levan Hurst    Encounter Date: 09/20/2018  PT End of Session - 09/20/18 1109    Visit Number  10    Number of Visits  18    Date for PT Re-Evaluation  10/12/18    PT Start Time  1104    PT Stop Time  1157    PT Time Calculation (min)  53 min    Activity Tolerance  Patient tolerated treatment well    Behavior During Therapy  [facility] for tasks assessed/performed       Past Medical History:  Diagnosis Date  . Asthma   . Bipolar 1 disorder ([place])   . Depression   . GERD (gastroesophageal reflux disease)   . Hyperlipemia   . Hypothyroid   . Migraines     Past Surgical History:  Procedure Laterality Date  . ABDOMINAL SURGERY    . CARPAL TUNNEL RELEASE Bilateral 1999  . CESAREAN SECTION    . CHOLECYSTECTOMY    . THYROIDECTOMY    . TONSILLECTOMY      There were no vitals filed for this visit.  Subjective Assessment - 09/20/18 1111    Subjective  Pt reports her Rt knee is achy today; attributes it to the rainy weather.  Interested in strengthening to prepare for work and getting her knee straighter.     Patient Stated Goals  back to work     Currently in Pain?  Yes    Pain Score  4     Pain Location  Knee    Pain Orientation  Right    Pain Descriptors / Indicators  Tightness;Aching    Aggravating Factors   prolonged positions    Pain Relieving Factors  ice, medication.          [facility] PT Assessment - 09/20/18 0001      PROM   Right Knee Flexion  125       [facility] Adult PT Treatment/Exercise - 09/20/18 0001      Knee/Hip Exercises: Stretches   Passive Hamstring Stretch  30 seconds;2 reps   supine with strap    Quad Stretch  Right;2 reps;30 seconds   prone with strap   Gastroc Stretch  Both;3 reps;30 seconds   incline board.    Other Knee/Hip Stretches  Rt knee flexion, forward lunge with foot on 28" surface x 20 sec x 4 reps       Knee/Hip Exercises: Aerobic   Elliptical  2 min no resistance - 1.5 min fwd; 30 sec back     Recumbent Bike  L1-2: 5 min     Other Aerobic  laps around gym in between exercises to decrease stiffness, cues to increase Rt heel strike.       Knee/Hip Exercises: Standing   SLS with Vectors  --    Other Standing Knee Exercises  forward step up onto 10.5" step with unilateral UE support, other hand holding 10# x 10 reps; repeated with 5#.  split squat with limited depth, unilateral support on counter x 8 reps each leg forward.     Other Standing Knee Exercises  standing to high kneeling (on 3" pad) and returning to standing x3 reps       Moist Heat Therapy  Number Minutes Moist Heat  10 Minutes    Moist Heat Location  Lumbar Spine      Vasopneumatic   Number Minutes Vasopneumatic   10 minutes    Vasopnuematic Location   Knee   Rt    Vasopneumatic Pressure  Low    Vasopneumatic Temperature   34 deg      Manual Therapy   Soft tissue mobilization  STM to Rt hamstring and prox calf.     Passive ROM  overpressure into Rt knee ext x 20 sec x 3 reps                   PT Long Term Goals - 08/31/18 1058      PT LONG TERM GOAL #1   Title  Increase ROM Rt knee to 0 deg extension - 125 deg flexion 10/12/2018    Time  10    Period  Weeks    Status  Revised      PT LONG TERM GOAL #2   Title  5/5 strength Rt LE 10/12/2018    Time  10    Period  Weeks    Status  Revised      PT LONG TERM GOAL #3   Title  Patient tolerating 30 min of standing and or walking 10/12/2018    Time  10    Period  Weeks    Status  Revised      PT LONG TERM GOAL #4   Title  Independent in HEP 10/12/2018    Time  10    Period  Weeks    Status  Revised      PT LONG TERM GOAL #5   Title  Improve FOTO to </= 43% limitation 10/12/2018     Time  10    Period  Weeks    Status  Revised            Plan - 09/20/18 1154    Clinical Impression Statement  Pt continues with limited Rt knee ext ROM.  pt tolerated all exercises well, without difficulty.  Pt is progressing gradually towards remaining goals.     Rehab Potential  Excellent    PT Frequency  2x / week    PT Duration  4 weeks    PT Treatment/Interventions  Patient/family education;ADLs/Self Care Home Management;Cryotherapy;Electrical Stimulation;Iontophoresis 4mg /ml Dexamethasone;Moist Heat;Ultrasound;Dry needling;Manual techniques;Gait training;Stair training;Functional mobility training;Therapeutic activities;Therapeutic exercise;Balance training;Neuromuscular re-education;Taping    PT Next Visit Plan  continue to simulate work activities with functional strengthening; continued focus on ROM and functional strengthening.     Consulted and Agree with Plan of Care  Patient       Patient will benefit from skilled therapeutic intervention in order to improve the following deficits and impairments:  Increased muscle spasms, Increased fascial restricitons, Decreased mobility, Decreased range of motion, Abnormal gait, Decreased activity tolerance, Decreased endurance, Decreased strength, Pain  Visit Diagnosis: Acute pain of right knee  Muscle weakness (generalized)  Localized edema  Other abnormalities of gait and mobility     Problem List Patient Active Problem List   Diagnosis Date Noted  . Acute cystitis without hematuria 03/21/2018  . History of left knee replacement 02/10/2018  . Morbid obesity ([place]) 11/21/2017  . Primary osteoarthritis of left knee 09/22/2017  . Primary osteoarthritis of right knee 09/22/2017  . Attention deficit hyperactivity disorder (ADHD) 05/12/2016  . Episodic mood disorder ([place]) 05/12/2016  . Type 2 diabetes mellitus without complication, without long-term current use of  insulin ([place]) 05/12/2016  . Gastroesophageal reflux disease  without esophagitis 05/12/2016  . DYSPNEA 07/22/2010  . CHEST PAIN 07/22/2010  . Hypothyroidism 09/13/2007  . Hyperlipidemia 09/13/2007  . ANXIETY DEPRESSION 09/13/2007  . MIGRAINE HEADACHE 09/13/2007  . Essential hypertension 09/13/2007  . ASTHMA 09/13/2007  . CHOLELITHIASIS, HX OF 09/13/2007  . NEPHROLITHIASIS, HX OF 09/13/2007  . CARPAL TUNNEL RELEASE, HX OF 09/13/2007   Kerin Perna, PTA 09/20/18 11:56 AM  [place], [place], [address] Phone: [phone]   Fax:  [phone]  Name: Judith [name] MRN: [id number] Date of Birth: [birth year]

## 2018-09-22 ENCOUNTER — Encounter: Payer: BLUE CROSS/BLUE SHIELD | Admitting: Physical Therapy

## 2018-09-27 ENCOUNTER — Other Ambulatory Visit: Payer: Self-pay

## 2018-09-27 ENCOUNTER — Encounter: Payer: Self-pay | Admitting: Rehabilitative and Restorative Service Providers"

## 2018-09-27 ENCOUNTER — Ambulatory Visit (INDEPENDENT_AMBULATORY_CARE_PROVIDER_SITE_OTHER): Payer: BLUE CROSS/BLUE SHIELD | Admitting: Rehabilitative and Restorative Service Providers"

## 2018-09-27 DIAGNOSIS — M25561 Pain in right knee: Secondary | ICD-10-CM | POA: Diagnosis not present

## 2018-09-27 DIAGNOSIS — R600 Localized edema: Secondary | ICD-10-CM | POA: Diagnosis not present

## 2018-09-27 DIAGNOSIS — R2689 Other abnormalities of gait and mobility: Secondary | ICD-10-CM | POA: Diagnosis not present

## 2018-09-27 DIAGNOSIS — M6281 Muscle weakness (generalized): Secondary | ICD-10-CM

## 2018-09-27 NOTE — Therapy (Signed)
[place], [place], [address] Phone: [phone]   Fax:  [phone]  Physical Therapy Treatment  Patient Details  Name: Judith [name] MRN: [id number] Date of Birth: 1971/05/08 Referring Provider (PT): Dr Levan Hurst    Encounter Date: 09/27/2018  PT End of Session - 09/27/18 1153    Visit Number  11    Number of Visits  18    Date for PT Re-Evaluation  10/12/18    PT Start Time  1128    PT Stop Time  1224    PT Time Calculation (min)  56 min    Activity Tolerance  Patient tolerated treatment well       Past Medical History:  Diagnosis Date  . Asthma   . Bipolar 1 disorder ([place])   . Depression   . GERD (gastroesophageal reflux disease)   . Hyperlipemia   . Hypothyroid   . Migraines     Past Surgical History:  Procedure Laterality Date  . ABDOMINAL SURGERY    . CARPAL TUNNEL RELEASE Bilateral 1999  . CESAREAN SECTION    . CHOLECYSTECTOMY    . THYROIDECTOMY    . TONSILLECTOMY      There were no vitals filed for this visit.  Subjective Assessment - 09/27/18 1153    Subjective  Returns to work Saturday - wants to get back. Bored at home. Knee has good days and bad days - still stiff at times and she can't get it really all the way straight.     Currently in Pain?  Yes    Pain Score  5     Pain Location  Knee    Pain Orientation  Right    Pain Descriptors / Indicators  Tightness;Aching    Pain Type  Chronic pain    Pain Onset  More than a month ago    Pain Frequency  Constant    Aggravating Factors   holding leg in one position    Pain Relieving Factors  ice; medication                        [facility] Adult PT Treatment/Exercise - 09/27/18 0001      Knee/Hip Exercises: Stretches   Passive Hamstring Stretch  30 seconds;2 reps   supine with strap assist w/ pressure through distal quad    Gastroc Stretch  Both;3 reps;30 seconds   incline board.    Gastroc  Stretch Limitations  prolonged stretch leaning onto high table 30 sec x 3 reps       Knee/Hip Exercises: Aerobic   Recumbent Bike  L1-2: 7 min     Other Aerobic  laps around gym in between exercises to decrease stiffness, cues to increase Rt heel strike.       Knee/Hip Exercises: Standing   Walking with Sports Cord  knee extension in standing pressing back of knee into orange ball 10 sec hold x 10 reps     Gait Training  stepping up and down on step ladder x 10 resp       Knee/Hip Exercises: Seated   Other Seated Knee/Hip Exercises  working on knee extension pressing down at distal thigh heel on floor 1 min x 3 reps     Sit to Sand  5 reps;without UE support   slow eccentric stand to sit holding near seated position      Knee/Hip Exercises: Supine   Quad Sets  AROM;Strengthening;Right;10 reps   focus on terminal knee extension    Short Arc Quad Sets  Strengthening;Right;10 reps;2 sets   thigh resting on green noodle slow release from straight    Straight Leg Raises  AROM;Strengthening;Right;10 reps   8-10 in lift maintaining knee ext for eccentric lowering      Moist Heat Therapy   Number Minutes Moist Heat  15 Minutes    Moist Heat Location  Lumbar Spine      Vasopneumatic   Number Minutes Vasopneumatic   15 minutes    Vasopnuematic Location   Knee   Rt    Vasopneumatic Pressure  Low    Vasopneumatic Temperature   34 deg      Manual Therapy   Passive ROM  overpressure into Rt knee ext x 20 sec x 4 reps                   PT Long Term Goals - 08/31/18 1058      PT LONG TERM GOAL #1   Title  Increase ROM Rt knee to 0 deg extension - 125 deg flexion 10/12/2018    Time  10    Period  Weeks    Status  Revised      PT LONG TERM GOAL #2   Title  5/5 strength Rt LE 10/12/2018    Time  10    Period  Weeks    Status  Revised      PT LONG TERM GOAL #3   Title  Patient tolerating 30 min of standing and or walking 10/12/2018    Time  10    Period  Weeks    Status   Revised      PT LONG TERM GOAL #4   Title  Independent in HEP 10/12/2018    Time  10    Period  Weeks    Status  Revised      PT LONG TERM GOAL #5   Title  Improve FOTO to </= 43% limitation 10/12/2018    Time  10    Period  Weeks    Status  Revised            Plan - 09/27/18 1153    Clinical Impression Statement  Continued work on ROM; strength; functional activities. Added exercises. Continues to have limited Rt knee extension. Progressing well toward stated goals of therapy. Returns to work this week.     Rehab Potential  Excellent    PT Frequency  2x / week    PT Duration  4 weeks    PT Treatment/Interventions  Patient/family education;ADLs/Self Care Home Management;Cryotherapy;Electrical Stimulation;Iontophoresis 4mg /ml Dexamethasone;Moist Heat;Ultrasound;Dry needling;Manual techniques;Gait training;Stair training;Functional mobility training;Therapeutic activities;Therapeutic exercise;Balance training;Neuromuscular re-education;Taping    PT Next Visit Plan  continue to simulate work activities with functional strengthening; continued focus on ROM and functional strengthening.     Consulted and Agree with Plan of Care  Patient       Patient will benefit from skilled therapeutic intervention in order to improve the following deficits and impairments:  Increased muscle spasms, Increased fascial restricitons, Decreased mobility, Decreased range of motion, Abnormal gait, Decreased activity tolerance, Decreased endurance, Decreased strength, Pain  Visit Diagnosis: Acute pain of right knee  Muscle weakness (generalized)  Localized edema  Other abnormalities of gait and mobility     Problem List Patient Active Problem List   Diagnosis Date Noted  . Acute cystitis without hematuria 03/21/2018  . History of left knee replacement 02/10/2018  .  Morbid obesity ([place]) 11/21/2017  . Primary osteoarthritis of left knee 09/22/2017  . Primary osteoarthritis of right knee  09/22/2017  . Attention deficit hyperactivity disorder (ADHD) 05/12/2016  . Episodic mood disorder ([place]) 05/12/2016  . Type 2 diabetes mellitus without complication, without long-term current use of insulin ([place]) 05/12/2016  . Gastroesophageal reflux disease without esophagitis 05/12/2016  . DYSPNEA 07/22/2010  . CHEST PAIN 07/22/2010  . Hypothyroidism 09/13/2007  . Hyperlipidemia 09/13/2007  . ANXIETY DEPRESSION 09/13/2007  . MIGRAINE HEADACHE 09/13/2007  . Essential hypertension 09/13/2007  . ASTHMA 09/13/2007  . CHOLELITHIASIS, HX OF 09/13/2007  . NEPHROLITHIASIS, HX OF 09/13/2007  . CARPAL TUNNEL RELEASE, HX OF 09/13/2007    Judith [name] Nilda Simmer PT, MPH  09/27/2018, 12:29 PM  [facility] [place], [place], [address] Phone: [phone]   Fax:  [phone]  Name: Judith [name] MRN: [id number] Date of Birth: August 25, 1970

## 2018-09-28 ENCOUNTER — Inpatient Hospital Stay
Admit: 2018-09-28 | Payer: PRIVATE HEALTH INSURANCE | Attending: Rehabilitative and Restorative Service Providers" | Primary: Family Medicine

## 2018-09-28 DIAGNOSIS — M25512 Pain in left shoulder: Secondary | ICD-10-CM

## 2018-09-28 NOTE — Progress Notes (Signed)
PT DAILY TREATMENT NOTE 2-15    Patient Name: Sara Cooke Sara Cooke  Date:09/28/2018  DOB: [birth year]  [x]   Patient DOB Verified  Payor: [employer] [place] / Plan: [employer] / Product Type: Commerical /    In time:315  Out time:411  Total Treatment Time (min): 56  Visit #:  28    Treatment Area: Left shoulder pain [M25.512]    Sara Cooke was informed of the inherent limitations of Sara virtual visit,  and has consented to Sara virtual therapy visit on 06/06/2019.  Information regarding emergency contact information for this patient during this visit is to contact:  Sara Cooke at [phone] in addition to calling [phone].  The patient was informed that at any time during the virtual visit, they can decide to stop the virtual visit.  The patient verified that they are physically located in the state of [place] for this virtual visit.    SUBJECTIVE  Pain Level (0-10 scale): 0  Any medication changes, allergies to medications, adverse drug reactions, diagnosis change, or new procedure performed?: [x]  No    []  Yes (see summary sheet for update)  Subjective functional status/changes:   [x]  No changes reported    OBJECTIVE    56 min Therapeutic Exercise:  [x]  See flow sheet :   Rationale: increase ROM, increase strength and improve coordination to improve the patient???s ability to complete all activity          With   [x]  TE   []  TA   []  Neuro   []  SC   []  other: Patient Education: [x]  Review HEP    [x]  Progressed/Changed HEP based on:   [x]  positioning   [x]  body mechanics   []  transfers   []  heat/ice application    [x]  other:      Pain Level (0-10 scale) post treatment: 0    ASSESSMENT/Changes in Function:    Pt continues to progress her strength, but is finding it difficult to get her routine done as often as she would like.  Work has become more challenging with the pandemic and she is also trying to take care of her parents as well.  She will continue to progress through her HEP and work to gain end range motion as well as improve her strength to increase the efficiency of her movement and complete this without any compensations  Patient will continue to benefit from skilled PT services to modify and progress therapeutic interventions, address functional mobility deficits, address ROM deficits, address strength deficits, analyze and address soft tissue restrictions, analyze and cue movement patterns, analyze and modify body mechanics/ergonomics, assess and modify postural abnormalities and address imbalance/dizziness to attain remaining goals.     []   See Plan of Care  []   See progress note/recertification  []   See Discharge Summary         Progress towards goals / Updated goals:  Progressing toward treatment goals    PLAN  [x]   Upgrade activities as tolerated     [x]   Continue plan of care  [x]   Update interventions per flow sheet       []   Discharge due to:_  []   Other:_         Sara Cooke is Sara 48 y.o. female being evaluated by Sara Virtual Visit (video visit) encounter to address concerns as mentioned above.  Sara caregiver was present when appropriate. Due to this being Sara TeleHealth encounter (During COVID-19  public health emergency), evaluation of the following areas was limited: Direct tissue palpation, direct goniometric measurements, blood pressure, O2 saturation. Pursuant to the emergency declaration under the [facility] Act and the [employer], 1135 waiver authority and the [employer] Act, this Virtual Visit was conducted with patient's (and/or legal guardian's) consent, to reduce the risk of exposure to COVID-19 and provide necessary medical care.      Services were provided through Sara video synchronous discussion virtually to substitute for in-person encounter.    --Sara Cooke, PT on 09/28/2018 at 7:19 AM    An electronic signature was used to authenticate this note.

## 2018-09-28 NOTE — Progress Notes (Signed)
 PT DAILY TREATMENT NOTE 2-15    Patient Name: Sara Cooke  Date:09/28/2018  DOB: 10-14-1970  [x]   Patient DOB Verified  Payor: [employer] [place]  / Plan: [employer] / Product Type: Commerical /    In time:315  Out time:411  Total Treatment Time (min): 56  Visit #:  28    Treatment Area: Left shoulder pain [M25.512]    Sara Cooke was informed of the inherent limitations of a virtual visit,  and has consented to a virtual therapy visit on 06/06/2019.  Information regarding emergency contact information for this patient during this visit is to contact:  Sara Cooke at [phone] in addition to calling 911.  The patient was informed that at any time during the virtual visit, they can decide to stop the virtual visit.  The patient verified that they are physically located in the state of [place]  for this virtual visit.    SUBJECTIVE  Pain Level (0-10 scale): 0  Any medication changes, allergies to medications, adverse drug reactions, diagnosis change, or new procedure performed?: [x]  No    []  Yes (see summary sheet for update)  Subjective functional status/changes:   [x]  No changes reported    OBJECTIVE    56 min Therapeutic Exercise:  [x]  See flow sheet :   Rationale: increase ROM, increase strength and improve coordination to improve the patient's ability to complete all activity          With   [x]  TE   []  TA   []  Neuro   []  SC   []  other: Patient Education: [x]  Review HEP    [x]  Progressed/Changed HEP based on:   [x]  positioning   [x]  body mechanics   []  transfers   []  heat/ice application    [x]  other:      Pain Level (0-10 scale) post treatment: 0    ASSESSMENT/Changes in Function:   Pt continues to progress her strength, but is finding it difficult to get her routine done as often as she would like.  Work has become more challenging with the pandemic and she is also trying to take care of her parents as well.  She will continue to progress through her HEP and work to  gain end range motion as well as improve her strength to increase the efficiency of her movement and complete this without any compensations  Patient will continue to benefit from skilled PT services to modify and progress therapeutic interventions, address functional mobility deficits, address ROM deficits, address strength deficits, analyze and address soft tissue restrictions, analyze and cue movement patterns, analyze and modify body mechanics/ergonomics, assess and modify postural abnormalities and address imbalance/dizziness to attain remaining goals.     []   See Plan of Care  []   See progress note/recertification  []   See Discharge Summary         Progress towards goals / Updated goals:  Progressing toward treatment goals    PLAN  [x]   Upgrade activities as tolerated     [x]   Continue plan of care  [x]   Update interventions per flow sheet       []   Discharge due to:_  []   Other:_        Sara Cooke is a 48 y.o. female being evaluated by a Virtual Visit (video visit) encounter to address concerns as mentioned above.  A caregiver was present when appropriate. Due to this being a Scientist, research (medical) (During COVID-19 public  health emergency), evaluation of the following areas was limited: Direct tissue palpation, direct goniometric measurements, blood pressure, O2 saturation. Pursuant to the emergency declaration under the [facility] Act and the [employer], 1135 waiver authority and the [employer] Act, this Virtual Visit was conducted with patient's (and/or legal guardian's) consent, to reduce the risk of exposure to COVID-19 and provide necessary medical care.      Services were provided through a video synchronous discussion virtually to substitute for in-person encounter.    --Alm Sara Cooke, PT on 09/28/2018 at 7:19 AM    An electronic signature was used to authenticate this note.

## 2018-09-29 ENCOUNTER — Ambulatory Visit (INDEPENDENT_AMBULATORY_CARE_PROVIDER_SITE_OTHER): Payer: BLUE CROSS/BLUE SHIELD | Admitting: Rehabilitative and Restorative Service Providers"

## 2018-09-29 ENCOUNTER — Encounter: Payer: Self-pay | Admitting: Rehabilitative and Restorative Service Providers"

## 2018-09-29 ENCOUNTER — Other Ambulatory Visit: Payer: Self-pay

## 2018-09-29 DIAGNOSIS — R600 Localized edema: Secondary | ICD-10-CM | POA: Diagnosis not present

## 2018-09-29 DIAGNOSIS — R2689 Other abnormalities of gait and mobility: Secondary | ICD-10-CM

## 2018-09-29 DIAGNOSIS — M6281 Muscle weakness (generalized): Secondary | ICD-10-CM | POA: Diagnosis not present

## 2018-09-29 DIAGNOSIS — M25561 Pain in right knee: Secondary | ICD-10-CM | POA: Diagnosis not present

## 2018-09-29 DIAGNOSIS — M25562 Pain in left knee: Secondary | ICD-10-CM

## 2018-09-29 NOTE — Therapy (Signed)
[facility] [place], [place], [address] Phone: [phone]   Fax:  [phone]  Physical Therapy Treatment  Patient Details  Name: Judith [name] MRN: [id number] Date of Birth: [birth year] Referring Provider (PT): Dr Levan Hurst    Encounter Date: 09/29/2018  PT End of Session - 09/29/18 1108    Visit Number  12    Number of Visits  18    Date for PT Re-Evaluation  10/12/18    PT Start Time  1105    PT Stop Time  1200    PT Time Calculation (min)  55 min    Activity Tolerance  Patient tolerated treatment well       Past Medical History:  Diagnosis Date  . Asthma   . Bipolar 1 disorder ([place])   . Depression   . GERD (gastroesophageal reflux disease)   . Hyperlipemia   . Hypothyroid   . Migraines     Past Surgical History:  Procedure Laterality Date  . ABDOMINAL SURGERY    . CARPAL TUNNEL RELEASE Bilateral 1999  . CESAREAN SECTION    . CHOLECYSTECTOMY    . THYROIDECTOMY    . TONSILLECTOMY      There were no vitals filed for this visit.  Subjective Assessment - 09/29/18 1108    Subjective  Very sore after last PT session. She had increased pain and swelling that evening and the knee is still sore. Goes to work Saturday. Has ice machine that she plans to use when she has a break at work.     Currently in Pain?  Yes    Pain Score  5     Pain Location  Knee    Pain Orientation  Right    Pain Descriptors / Indicators  Aching;Tightness    Pain Type  Chronic pain;Surgical pain    Pain Onset  More than a month ago    Pain Frequency  Constant    Aggravating Factors   holding leg in one position     Pain Relieving Factors  ice; medication          [facility] PT Assessment - 09/29/18 0001      Assessment   Medical Diagnosis  Rt TKA    Referring Provider (PT)  Dr Levan Hurst     Onset Date/Surgical Date  06/22/18    Hand Dominance  Right    Next MD Visit  to schedule     Prior Therapy   here for Lt TKA       AROM   Right Knee Extension  -7    Right Knee Flexion  120    Left Knee Extension  0    Left Knee Flexion  128      PROM   Right Knee Extension  -5      Strength   Right Hip Flexion  5/5    Right Hip Extension  --   5-/5   Right Hip ABduction  --   5-/5   Right Hip ADduction  5/5    Left Hip Flexion  5/5    Left Hip Extension  5/5    Left Hip ABduction  5/5    Left Hip ADduction  5/5    Right Knee Flexion  5/5    Right Knee Extension  --   5-/5   Left Knee Flexion  5/5    Left Knee Extension  5/5  [facility] Adult PT Treatment/Exercise - 09/29/18 0001      Knee/Hip Exercises: Stretches   Passive Hamstring Stretch  30 seconds;2 reps   supine with strap assist w/ pressure through distal quad      Knee/Hip Exercises: Aerobic   Tread Mill  2.0-2.5 mph x 5 min, 0% incline     Recumbent Bike  L1-2: 7 min     Other Aerobic  laps around gym in between exercises to decrease stiffness, cues to increase Rt heel strike.       Knee/Hip Exercises: Supine   Quad Sets  AROM;Strengthening;Right;10 reps   focus on terminal knee extension    Short Arc Quad Sets  Strengthening;Right;10 reps;2 sets   thigh resting on green noodle slow release from straight    Straight Leg Raises  AROM;Strengthening;Right;10 reps   8-10 in lift maintaining knee ext for eccentric lowering      Moist Heat Therapy   Number Minutes Moist Heat  15 Minutes    Moist Heat Location  Lumbar Spine      Vasopneumatic   Number Minutes Vasopneumatic   15 minutes    Vasopnuematic Location   Knee   Rt    Vasopneumatic Pressure  Low    Vasopneumatic Temperature   34 deg      Manual Therapy   Passive ROM  overpressure into Rt knee ext x 20 sec x 4 reps              PT Education - 09/29/18 1118    Education Details  encouraged continued consistent HEP; ice for flare up of symptoms; slow return to work     [employer]) Educated  Patient    Methods   Explanation    Comprehension  Verbalized understanding          PT Long Term Goals - 08/31/18 1058      PT LONG TERM GOAL #1   Title  Increase ROM Rt knee to 0 deg extension - 125 deg flexion 10/12/2018    Time  10    Period  Weeks    Status  Revised      PT LONG TERM GOAL #2   Title  5/5 strength Rt LE 10/12/2018    Time  10    Period  Weeks    Status  Revised      PT LONG TERM GOAL #3   Title  Patient tolerating 30 min of standing and or walking 10/12/2018    Time  10    Period  Weeks    Status  Revised      PT LONG TERM GOAL #4   Title  Independent in HEP 10/12/2018    Time  10    Period  Weeks    Status  Revised      PT LONG TERM GOAL #5   Title  Improve FOTO to </= 43% limitation 10/12/2018    Time  10    Period  Weeks    Status  Revised            Plan - 09/29/18 1108    Clinical Impression Statement  Flare up of pain and edema following last therapy session. Decreased intensity of exercise today due to flare up and anticipating RTW Saturday. Will continue rehab progressing as tolerated.     Rehab Potential  Excellent    PT Frequency  2x / week    PT Duration  4 weeks    PT Treatment/Interventions  Patient/family education;ADLs/Self Care Home Management;Cryotherapy;Electrical Stimulation;Iontophoresis 4mg /ml Dexamethasone;Moist Heat;Ultrasound;Dry needling;Manual techniques;Gait training;Stair training;Functional mobility training;Therapeutic activities;Therapeutic exercise;Balance training;Neuromuscular re-education;Taping    PT Next Visit Plan  continue to simulate work activities with functional strengthening; continued focus on ROM and functional strengthening.     Consulted and Agree with Plan of Care  Patient       Patient will benefit from skilled therapeutic intervention in order to improve the following deficits and impairments:  Increased muscle spasms, Increased fascial restricitons, Decreased mobility, Decreased range of motion, Abnormal gait,  Decreased activity tolerance, Decreased endurance, Decreased strength, Pain  Visit Diagnosis: Acute pain of right knee  Muscle weakness (generalized)  Localized edema  Other abnormalities of gait and mobility  Acute pain of left knee     Problem List Patient Active Problem List   Diagnosis Date Noted  . Acute cystitis without hematuria 03/21/2018  . History of left knee replacement 02/10/2018  . Morbid obesity ([place]) 11/21/2017  . Primary osteoarthritis of left knee 09/22/2017  . Primary osteoarthritis of right knee 09/22/2017  . Attention deficit hyperactivity disorder (ADHD) 05/12/2016  . Episodic mood disorder ([place]) 05/12/2016  . Type 2 diabetes mellitus without complication, without long-term current use of insulin ([place]) 05/12/2016  . Gastroesophageal reflux disease without esophagitis 05/12/2016  . DYSPNEA 07/22/2010  . CHEST PAIN 07/22/2010  . Hypothyroidism 09/13/2007  . Hyperlipidemia 09/13/2007  . ANXIETY DEPRESSION 09/13/2007  . MIGRAINE HEADACHE 09/13/2007  . Essential hypertension 09/13/2007  . ASTHMA 09/13/2007  . CHOLELITHIASIS, HX OF 09/13/2007  . NEPHROLITHIASIS, HX OF 09/13/2007  . CARPAL TUNNEL RELEASE, HX OF 09/13/2007    Cozetta Seif Nilda Simmer PT, MPH  09/29/2018, 11:43 AM  [facility] [place], [place], [address] Phone: [phone]   Fax:  [phone]  Name: NADEGE CARRIGER MRN: [id number] Date of Birth: 22-Aug-1970

## 2018-10-04 ENCOUNTER — Encounter: Payer: Self-pay | Admitting: Physical Therapy

## 2018-10-04 ENCOUNTER — Ambulatory Visit (INDEPENDENT_AMBULATORY_CARE_PROVIDER_SITE_OTHER): Payer: BLUE CROSS/BLUE SHIELD | Admitting: Physical Therapy

## 2018-10-04 ENCOUNTER — Other Ambulatory Visit: Payer: Self-pay

## 2018-10-04 DIAGNOSIS — M6281 Muscle weakness (generalized): Secondary | ICD-10-CM | POA: Diagnosis not present

## 2018-10-04 DIAGNOSIS — R600 Localized edema: Secondary | ICD-10-CM

## 2018-10-04 DIAGNOSIS — M25561 Pain in right knee: Secondary | ICD-10-CM

## 2018-10-04 DIAGNOSIS — R2689 Other abnormalities of gait and mobility: Secondary | ICD-10-CM

## 2018-10-04 NOTE — Therapy (Signed)
[facility] [place], [place], [address] Phone: [phone]   Fax:  [phone]  Physical Therapy Treatment  Patient Details  Name: Judith [name] MRN: [id number] Date of Birth: 05/25/1970 Referring Provider (PT): Dr Levan Hurst    Encounter Date: 10/04/2018  PT End of Session - 10/04/18 1006    Visit Number  13    Number of Visits  18    Date for PT Re-Evaluation  10/12/18    PT Start Time  1005    PT Stop Time  1100    PT Time Calculation (min)  55 min       Past Medical History:  Diagnosis Date  . Asthma   . Bipolar 1 disorder ([place])   . Depression   . GERD (gastroesophageal reflux disease)   . Hyperlipemia   . Hypothyroid   . Migraines     Past Surgical History:  Procedure Laterality Date  . ABDOMINAL SURGERY    . CARPAL TUNNEL RELEASE Bilateral 1999  . CESAREAN SECTION    . CHOLECYSTECTOMY    . THYROIDECTOMY    . TONSILLECTOMY      There were no vitals filed for this visit.  Subjective Assessment - 10/04/18 1007    Subjective  Pt reports that Saturday (day after last session) was rough.  She has returned to work, and noticed she is nervous going down the stairs (fearful of leg buckling) .      Pertinent History  Lt TKA 01/20/18; asthma; HTN; gall bladder removed; depression; anxiety; c-section     Patient Stated Goals  back to work     Currently in Pain?  Yes    Pain Score  3     Pain Location  Knee    Pain Orientation  Right    Pain Descriptors / Indicators  Tightness;Aching    Aggravating Factors   holding leg in one position    Pain Relieving Factors  ice, medication          [facility] PT Assessment - 10/04/18 0001      Assessment   Medical Diagnosis  Rt TKA    Referring Provider (PT)  Dr Levan Hurst     Onset Date/Surgical Date  06/22/18    Hand Dominance  Right    Next MD Visit  to schedule     Prior Therapy  here for Lt TKA         [facility] Adult PT  Treatment/Exercise - 10/04/18 0001      Knee/Hip Exercises: Stretches   Passive Hamstring Stretch  3 reps;30 seconds;Right;Left   standing with heel on 12" step   ITB Stretch  Right;2 reps;30 seconds   supine with strap    Gastroc Stretch  Both;30 seconds;3 reps   incline board      Knee/Hip Exercises: Aerobic   Recumbent Bike  L1-2: 5 min    PTA present to discuss progress     Knee/Hip Exercises: Standing   Terminal Knee Extension  Right;1 set;10 reps - blue band, 5 sec holds, cues for form.    Forward Step Up  Right;1 set;Hand Hold: 1;10 reps   step height 10.5"; eccentric lowering backwards   Functional Squat  10 reps   squatting and lifting 17# box from floor (holding handles)     Knee/Hip Exercises: Supine   Quad Sets  AROM;Strengthening;Right;10 reps   focus on terminal knee extension    Knee Flexion Limitations  rolling large green ball into hip and knee flexion x 1 min     Other Supine Knee/Hip Exercises  LE/trunk rotation side to side with ball ~ 1 min       Knee/Hip Exercises: Sidelying   Hip ABduction  Strengthening;Right;2 sets;10 reps    Hip ABduction Limitations  cues for technique      Vasopneumatic   Number Minutes Vasopneumatic   10 minutes    Vasopnuematic Location   Knee   Rt    Vasopneumatic Pressure  Medium    Vasopneumatic Temperature   34 deg      Manual Therapy   Soft tissue mobilization  STM to Rt hamstring and prox calf.     Passive ROM  overpressure into Rt knee ext x 20 sec x 4 reps       Kinesiotix   Inhibit Muscle   I strip of Regular Rock tape applied with 15% stretch applied to Rt lateral hamstring; small perpendicular strip applied lateral to bilateral patella with 50% stretch to decompress tissue and decrease pain.               PT Education - 10/04/18 1210    Education Details  added ITB stretch to HEP    Person(s) Educated  Patient    Methods  Explanation;Demonstration   pt declined handout   Comprehension  Verbalized  understanding;Returned demonstration          PT Long Term Goals - 08/31/18 1058      PT LONG TERM GOAL #1   Title  Increase ROM Rt knee to 0 deg extension - 125 deg flexion 10/12/2018    Time  10    Period  Weeks    Status  Revised      PT LONG TERM GOAL #2   Title  5/5 strength Rt LE 10/12/2018    Time  10    Period  Weeks    Status  Revised      PT LONG TERM GOAL #3   Title  Patient tolerating 30 min of standing and or walking 10/12/2018    Time  10    Period  Weeks    Status  Revised      PT LONG TERM GOAL #4   Title  Independent in HEP 10/12/2018    Time  10    Period  Weeks    Status  Revised      PT LONG TERM GOAL #5   Title  Improve FOTO to </= 43% limitation 10/12/2018    Time  10    Period  Weeks    Status  Revised            Plan - 10/04/18 1207    Clinical Impression Statement  Pt continues with decreased Rt knee ext and functionally weak Rt quad; pt demonstrated difficulty with forward step ups on 10" step, as well as eccentric lowering from step.  Persistant tightness noted in Rt lateral distal hamstring; improved with STM to area. Pt making gradual progress towards remaining goals.     Rehab Potential  Excellent    PT Frequency  2x / week    PT Duration  4 weeks    PT Treatment/Interventions  Patient/family education;ADLs/Self Care Home Management;Cryotherapy;Electrical Stimulation;Iontophoresis 4mg /ml Dexamethasone;Moist Heat;Ultrasound;Dry needling;Manual techniques;Gait training;Stair training;Functional mobility training;Therapeutic activities;Therapeutic exercise;Balance training;Neuromuscular re-education;Taping    PT Next Visit Plan  continue to simulate work activities with functional strengthening; continued focus on ROM and functional strengthening.  PT Home Exercise Plan  Access Code: [email or web address]     Consulted and Agree with Plan of Care  Patient       Patient will benefit from skilled therapeutic intervention in order to improve the  following deficits and impairments:  Increased muscle spasms, Increased fascial restricitons, Decreased mobility, Decreased range of motion, Abnormal gait, Decreased activity tolerance, Decreased endurance, Decreased strength, Pain  Visit Diagnosis: Acute pain of right knee  Muscle weakness (generalized)  Localized edema  Other abnormalities of gait and mobility     Problem List Patient Active Problem List   Diagnosis Date Noted  . Acute cystitis without hematuria 03/21/2018  . History of left knee replacement 02/10/2018  . Morbid obesity ([place]) 11/21/2017  . Primary osteoarthritis of left knee 09/22/2017  . Primary osteoarthritis of right knee 09/22/2017  . Attention deficit hyperactivity disorder (ADHD) 05/12/2016  . Episodic mood disorder ([place]) 05/12/2016  . Type 2 diabetes mellitus without complication, without long-term current use of insulin ([place]) 05/12/2016  . Gastroesophageal reflux disease without esophagitis 05/12/2016  . DYSPNEA 07/22/2010  . CHEST PAIN 07/22/2010  . Hypothyroidism 09/13/2007  . Hyperlipidemia 09/13/2007  . ANXIETY DEPRESSION 09/13/2007  . MIGRAINE HEADACHE 09/13/2007  . Essential hypertension 09/13/2007  . ASTHMA 09/13/2007  . CHOLELITHIASIS, HX OF 09/13/2007  . NEPHROLITHIASIS, HX OF 09/13/2007  . CARPAL TUNNEL RELEASE, HX OF 09/13/2007   Kerin Perna, PTA 10/04/18 12:13 PM  [facility], [place], [address] Phone: [phone]   Fax:  [phone]  Name: Judith [name] MRN: [id number] Date of Birth: [birth year]

## 2018-10-06 ENCOUNTER — Other Ambulatory Visit: Payer: Self-pay

## 2018-10-06 ENCOUNTER — Encounter: Payer: Self-pay | Admitting: Physical Therapy

## 2018-10-06 ENCOUNTER — Ambulatory Visit (INDEPENDENT_AMBULATORY_CARE_PROVIDER_SITE_OTHER): Payer: BLUE CROSS/BLUE SHIELD | Admitting: Physical Therapy

## 2018-10-06 DIAGNOSIS — R2689 Other abnormalities of gait and mobility: Secondary | ICD-10-CM

## 2018-10-06 DIAGNOSIS — M6281 Muscle weakness (generalized): Secondary | ICD-10-CM

## 2018-10-06 DIAGNOSIS — M25561 Pain in right knee: Secondary | ICD-10-CM | POA: Diagnosis not present

## 2018-10-06 DIAGNOSIS — R600 Localized edema: Secondary | ICD-10-CM

## 2018-10-06 NOTE — Therapy (Signed)
[place], [place], [address] Phone: [phone]   Fax:  [phone]  Physical Therapy Treatment  Patient Details  Name: Judith [name] MRN: [id number] Date of Birth: 23-Jan-1971 Referring Provider (PT): Dr Levan Hurst    Encounter Date: 10/06/2018  PT End of Session - 10/06/18 1549    Visit Number  14    Number of Visits  18    Date for PT Re-Evaluation  10/12/18    PT Start Time  1500    PT Stop Time  1555    PT Time Calculation (min)  55 min    Activity Tolerance  Patient tolerated treatment well    Behavior During Therapy  [facility] for tasks assessed/performed       Past Medical History:  Diagnosis Date  . Asthma   . Bipolar 1 disorder ([place])   . Depression   . GERD (gastroesophageal reflux disease)   . Hyperlipemia   . Hypothyroid   . Migraines     Past Surgical History:  Procedure Laterality Date  . ABDOMINAL SURGERY    . CARPAL TUNNEL RELEASE Bilateral 1999  . CESAREAN SECTION    . CHOLECYSTECTOMY    . THYROIDECTOMY    . TONSILLECTOMY      There were no vitals filed for this visit.  Subjective Assessment - 10/06/18 1511    Subjective  Pt reports she worked the last two days (8 hr shifts).  "I'm having spasms at top of calf".  Pt reports her knee has felt more swollen since starting work.      Currently in Pain?  Yes    Pain Score  5     Pain Location  Knee    Pain Orientation  Right    Pain Descriptors / Indicators  Tightness    Aggravating Factors   standing to long     Pain Relieving Factors  ice, medication          [facility] PT Assessment - 10/06/18 0001      Assessment   Medical Diagnosis  Rt TKA    Referring Provider (PT)  Dr Levan Hurst     Onset Date/Surgical Date  06/22/18    Hand Dominance  Right    Next MD Visit  to schedule     Prior Therapy  here for Lt TKA       AROM   Right Knee Extension  -10       [facility] Adult PT Treatment/Exercise -  10/06/18 0001      Knee/Hip Exercises: Stretches   Passive Hamstring Stretch  3 reps;30 seconds;Right;Left   seated with overpressure from pt.    ITB Stretch  Right;2 reps;30 seconds   supine with strap    ITB Stretch Limitations  trial of standing; unable to complete (complained of increased back pain )    Gastroc Stretch  Both;30 seconds;3 reps   incline board      Knee/Hip Exercises: Aerobic   Tread Mill  2.0 mph x 3 min with focus on even steps and increased Rt heel strike; retro step @ 1.0 mph with focus on TKE     Recumbent Bike  L1-2: 5 min    PTA present to discuss progress     Knee/Hip Exercises: Standing   Lateral Step Up  Right;2 sets;10 reps;Hand Hold: 1;Step Height: 8"    Lateral Step Up Limitations  second set as cross over step ups  Forward Step Up  Right;1 set;Hand Hold: 1;10 reps;Step Height: 8"   eccentric lowering backwards     Moist Heat Therapy   Number Minutes Moist Heat  10 Minutes    Moist Heat Location  Lumbar Spine      Vasopneumatic   Number Minutes Vasopneumatic   10 minutes    Vasopnuematic Location   Knee   Rt    Vasopneumatic Pressure  Medium    Vasopneumatic Temperature   34 deg      Manual Therapy   Soft tissue mobilization  STM to Rt hamstring and prox calf.     Passive ROM  overpressure into Rt knee ext x 20 sec x 4 reps         PT Education - 10/06/18 1659    Education Details  HEP    Person(s) Educated  Patient    Methods  Explanation;Handout;Demonstration;Verbal cues    Comprehension  Returned demonstration;Verbalized understanding          PT Long Term Goals - 08/31/18 1058      PT LONG TERM GOAL #1   Title  Increase ROM Rt knee to 0 deg extension - 125 deg flexion 10/12/2018    Time  10    Period  Weeks    Status  Revised      PT LONG TERM GOAL #2   Title  5/5 strength Rt LE 10/12/2018    Time  10    Period  Weeks    Status  Revised      PT LONG TERM GOAL #3   Title  Patient tolerating 30 min of standing and or  walking 10/12/2018    Time  10    Period  Weeks    Status  Revised      PT LONG TERM GOAL #4   Title  Independent in HEP 10/12/2018    Time  10    Period  Weeks    Status  Revised      PT LONG TERM GOAL #5   Title  Improve FOTO to </= 43% limitation 10/12/2018    Time  10    Period  Weeks    Status  Revised            Plan - 10/06/18 1633    Clinical Impression Statement  Pt has had flare up of swelling and pain in Rt knee since returning to work with increased standing hours.  Her Rt knee ext was lacking 10 deg, vs last visit of lacking 7 deg.  She was able to demonstrate improved control with ascending/descending stairs of 8" height.  She had difficulty tolerating medium pressure of vaso at end of session, with foot propped up.  No new goals met this date. Pt remains motivated to progress towards established therapy goals.     Rehab Potential  Excellent    PT Frequency  2x / week    PT Duration  4 weeks    PT Treatment/Interventions  Patient/family education;ADLs/Self Care Home Management;Cryotherapy;Electrical Stimulation;Iontophoresis 60m/ml Dexamethasone;Moist Heat;Ultrasound;Dry needling;Manual techniques;Gait training;Stair training;Functional mobility training;Therapeutic activities;Therapeutic exercise;Balance training;Neuromuscular re-education;Taping    PT Next Visit Plan  continued focus on ROM and functional strengthening.     PT Home Exercise Plan  Access Code: [email or web address]   Consulted and Agree with Plan of Care  Patient       Patient will benefit from skilled therapeutic intervention in order to improve the following deficits and impairments:  Increased muscle spasms,  Increased fascial restricitons, Decreased mobility, Decreased range of motion, Abnormal gait, Decreased activity tolerance, Decreased endurance, Decreased strength, Pain  Visit Diagnosis: Acute pain of right knee  Muscle weakness (generalized)  Localized edema  Other abnormalities of gait  and mobility     Problem List Patient Active Problem List   Diagnosis Date Noted  . Acute cystitis without hematuria 03/21/2018  . History of left knee replacement 02/10/2018  . Morbid obesity ([place]) 11/21/2017  . Primary osteoarthritis of left knee 09/22/2017  . Primary osteoarthritis of right knee 09/22/2017  . Attention deficit hyperactivity disorder (ADHD) 05/12/2016  . Episodic mood disorder ([place]) 05/12/2016  . Type 2 diabetes mellitus without complication, without long-term current use of insulin ([place]) 05/12/2016  . Gastroesophageal reflux disease without esophagitis 05/12/2016  . DYSPNEA 07/22/2010  . CHEST PAIN 07/22/2010  . Hypothyroidism 09/13/2007  . Hyperlipidemia 09/13/2007  . ANXIETY DEPRESSION 09/13/2007  . MIGRAINE HEADACHE 09/13/2007  . Essential hypertension 09/13/2007  . ASTHMA 09/13/2007  . CHOLELITHIASIS, HX OF 09/13/2007  . NEPHROLITHIASIS, HX OF 09/13/2007  . CARPAL TUNNEL RELEASE, HX OF 09/13/2007   Kerin Perna, PTA 10/06/18 5:03 PM   [place], [place], [address] Phone: [phone]   Fax:  [phone]  Name: Judith [name] MRN: [id number] Date of Birth: 08-Mar-1971

## 2018-10-06 NOTE — Patient Instructions (Signed)
Access Code: [email or web address]  URL: [email or web address]  Date: 10/06/2018  Prepared by: Kerin Perna   Exercises  Supine ITB Stretch with Strap - 2-3 reps - 1 sets - 20 seconds hold - 1x daily - 7x weekly  Standing ITB Stretch - 2-3 reps - 1 sets - 20 seonds hold - 1x daily - 7x weekly  Seated Knee Extension Stretch with Chair - 3 reps - 1 sets - 30-60 seconds hold - 2x daily - 7x weekly  Prone Knee Extension Hang - 1 reps - 2 sets - 1-2 minutes hold - 1x daily - 7x weekly  Standing Bilateral Gastroc Stretch with Step - 3 reps - 1 sets - 30-60 seconds hold - 2x daily - 7x weekly

## 2018-10-08 ENCOUNTER — Other Ambulatory Visit: Payer: Self-pay | Admitting: Physician Assistant

## 2018-10-08 DIAGNOSIS — M1711 Unilateral primary osteoarthritis, right knee: Secondary | ICD-10-CM

## 2018-10-13 ENCOUNTER — Encounter: Payer: BLUE CROSS/BLUE SHIELD | Admitting: Physical Therapy

## 2018-10-17 ENCOUNTER — Encounter: Payer: Self-pay | Admitting: Physician Assistant

## 2018-10-17 ENCOUNTER — Ambulatory Visit (INDEPENDENT_AMBULATORY_CARE_PROVIDER_SITE_OTHER): Payer: BLUE CROSS/BLUE SHIELD | Admitting: Physical Therapy

## 2018-10-17 ENCOUNTER — Ambulatory Visit (INDEPENDENT_AMBULATORY_CARE_PROVIDER_SITE_OTHER): Payer: BLUE CROSS/BLUE SHIELD | Admitting: Physician Assistant

## 2018-10-17 ENCOUNTER — Other Ambulatory Visit: Payer: Self-pay

## 2018-10-17 DIAGNOSIS — R600 Localized edema: Secondary | ICD-10-CM

## 2018-10-17 DIAGNOSIS — M6281 Muscle weakness (generalized): Secondary | ICD-10-CM | POA: Diagnosis not present

## 2018-10-17 DIAGNOSIS — R2689 Other abnormalities of gait and mobility: Secondary | ICD-10-CM | POA: Diagnosis not present

## 2018-10-17 DIAGNOSIS — M1711 Unilateral primary osteoarthritis, right knee: Secondary | ICD-10-CM

## 2018-10-17 DIAGNOSIS — M25561 Pain in right knee: Secondary | ICD-10-CM

## 2018-10-17 NOTE — Progress Notes (Signed)
Telephone visit  Subjective: YS:AYTK arthritis PCP: Terald Sleeper, PA-C HPI:Judith [name] is a 48 y.o. female calls for telephone consult today. Patient provides verbal consent for consult held via phone.  Patient is identified with 2 separate identifiers.  At this time the entire area is on COVID-19 social distancing and stay home orders are in place.  Patient is of higher risk and therefore we are performing this by a virtual method.  Location of patient: home Location of provider: HOME Others present for call: no  This is for recheck on the patient's chronic medical condition of Severe osteoarthritis and knee pain.  She is in the postsurgical..  She is still going to physical therapy and they are working on getting her full mobility accomplished.  She does need to have some changes to her FMLA paperwork they are as noted.  As of 10/18/18 she will have a change in her restrictions.  She is able to do standing, lifting, squatting etc.  The main his restriction is that she can work and no more than 3 days in a row.  And then another request is to have availability for a break every 2 hours to rest her leg for a bit.  Patient just can be made to her FMLA paperwork and sent to her insurance company and work.  The duration of these limitations are 10/18/18 through 12/18/18  ROS: Per HPI  Allergies  Allergen Reactions  . Diphenhydramine Other (See Comments)    Hair feels like it is crawling  . Morphine Nausea And Vomiting    PCA PUMP N/V IV PUSH IN ER NO PROBLEM PER PT.  . Prednisone Nausea And Vomiting  . Erythromycin Hives  . Sulfonamide Derivatives Other (See Comments)    Does not take due to family history   Past Medical History:  Diagnosis Date  . Asthma   . Bipolar 1 disorder ([place])   . Depression   . GERD (gastroesophageal reflux disease)   . Hyperlipemia   . Hypothyroid   . Migraines     Current Outpatient Medications:  .  albuterol (PROVENTIL) (2.5 MG/3ML)  0.083% nebulizer solution, Nebulize 1 vial three times daily, Disp: 150 mL, Rfl: 2 .  aspirin EC 81 MG tablet, Take 162 mg by mouth daily. , Disp: , Rfl:  .  atorvastatin (LIPITOR) 10 MG tablet, Take 1 tablet (10 mg total) by mouth daily., Disp: 90 tablet, Rfl: 3 .  budesonide-formoterol (SYMBICORT) 160-4.5 MCG/ACT inhaler, Inhale 2 puffs into the lungs 2 (two) times daily., Disp: 1 Inhaler, Rfl: 3 .  busPIRone (BUSPAR) 15 MG tablet, Take 15 mg by mouth 3 (three) times daily. , Disp: , Rfl:  .  cetirizine (ZYRTEC) 10 MG tablet, Take 1 tablet (10 mg total) by mouth daily. (Patient taking differently: Take 20 mg by mouth 2 (two) times daily. ), Disp: 30 tablet, Rfl: 11 .  diclofenac sodium (VOLTAREN) 1 % GEL, Apply 4 g topically 4 (four) times daily., Disp: 400 g, Rfl: 2 .  dicyclomine (BENTYL) 20 MG tablet, Take 1 tablet (20 mg total) by mouth every 6 (six) hours., Disp: 60 tablet, Rfl: 5 .  doxepin (SINEQUAN) 25 MG capsule, Take 25 mg by mouth at bedtime. , Disp: , Rfl:  .  fluconazole (DIFLUCAN) 150 MG tablet, 1 po q week x 4 weeks, Disp: 4 tablet, Rfl: 2 .  hydrOXYzine (ATARAX/VISTARIL) 25 MG tablet, Take 1 tablet (25 mg total) by mouth daily. (Patient taking differently:  Take 25 mg by mouth 3 (three) times daily. ), Disp: 30 tablet, Rfl: 1 .  ibuprofen (ADVIL) 800 MG tablet, TAKE 1 TABLET BY MOUTH EVERY 8 HOURS AS NEEDED, Disp: 90 tablet, Rfl: 2 .  lamoTRIgine (LAMICTAL) 150 MG tablet, Take 150 mg by mouth 2 (two) times daily., Disp: , Rfl:  .  levothyroxine (SYNTHROID, LEVOTHROID) 125 MCG tablet, TAKE ONE TAB BY MOUTH DAILY, Disp: 90 tablet, Rfl: 1 .  Lisdexamfetamine Dimesylate (VYVANSE PO), Take 70 mg by mouth daily. , Disp: , Rfl:  .  lisinopril (PRINIVIL,ZESTRIL) 20 MG tablet, Take 20 mg by mouth daily., Disp: , Rfl:  .  ondansetron (ZOFRAN ODT) 8 MG disintegrating tablet, Take 1 tablet (8 mg total) by mouth every 8 (eight) hours as needed for nausea or vomiting., Disp: 20 tablet, Rfl: 0 .   pantoprazole (PROTONIX) 40 MG tablet, TAKE ONE TAB BY MOUTH 2 TIMES DAILY, Disp: 180 tablet, Rfl: 0 .  pioglitazone (ACTOS) 15 MG tablet, TAKE 1 TABLET BY MOUTH DAILY, Disp: 90 tablet, Rfl: 0 .  sertraline (ZOLOFT) 100 MG tablet, Take 200 mg by mouth daily. , Disp: , Rfl:  .  silver sulfADIAZINE (SILVADENE) 1 % cream, Apply topically., Disp: , Rfl:  .  SUMAtriptan Succinate 4 MG/0.5ML SOCT, Inject 4 mg into the skin once., Disp: 9 Cartridge, Rfl: 11 .  traMADol (ULTRAM) 50 MG tablet, Take 1 tablet (50 mg total) by mouth every 8 (eight) hours as needed for severe pain., Disp: 90 tablet, Rfl: 5  Assessment/ Plan: 48 y.o. female   1. Primary osteoarthritis of right knee Continued limitations as noted above  Recheck in 2 months  Start time: 2:38 PM End time: 2:49 PM  No orders of the defined types were placed in this encounter.   Particia Nearing PA-C [place] [phone]

## 2018-10-17 NOTE — Therapy (Signed)
[place], [place], [address] Phone: [phone]   Fax:  [phone]  Physical Therapy Treatment  Patient Details  Name: ALAJA GOLDINGER MRN: [id number] Date of Birth: January 12, 1971 Referring Provider (PT): Dr Levan Hurst    Encounter Date: 10/17/2018  PT End of Session - 10/17/18 1504    Visit Number  15    Number of Visits  28    Date for PT Re-Evaluation  11/14/18    PT Start Time  1455    PT Stop Time  1550    PT Time Calculation (min)  55 min    Activity Tolerance  Patient tolerated treatment well    Behavior During Therapy  [facility] for tasks assessed/performed       Past Medical History:  Diagnosis Date  . Asthma   . Bipolar 1 disorder ([place])   . Depression   . GERD (gastroesophageal reflux disease)   . Hyperlipemia   . Hypothyroid   . Migraines     Past Surgical History:  Procedure Laterality Date  . ABDOMINAL SURGERY    . CARPAL TUNNEL RELEASE Bilateral 1999  . CESAREAN SECTION    . CHOLECYSTECTOMY    . THYROIDECTOMY    . TONSILLECTOMY      There were no vitals filed for this visit.  Subjective Assessment - 10/17/18 1505    Subjective  Pt complains of increased pain in bilat in calves lately.  She has been working 32 hrs/wk.  She has changed her sleep position, and this has helped her sleep better with less spasms.     Pertinent History  Lt TKA 01/20/18; asthma; HTN; gall bladder removed; depression; anxiety; c-section     Patient Stated Goals  back to work     Currently in Pain?  Yes    Pain Score  4     Pain Location  Calf    Pain Orientation  Right;Left    Pain Descriptors / Indicators  Sore;Constant    Aggravating Factors   raising up onto toes too long    Pain Relieving Factors  stretching, rest.     Multiple Pain Sites  Yes    Pain Score  2    Pain Location  Knee    Pain Orientation  Right    Pain Descriptors / Indicators  Aching;Dull    Aggravating Factors    standing too long    Pain Relieving Factors  ice          [facility] PT Assessment - 10/17/18 0001      Assessment   Medical Diagnosis  Rt TKA    Referring Provider (PT)  Dr Levan Hurst     Onset Date/Surgical Date  06/22/18    Hand Dominance  Right    Next MD Visit  to schedule     Prior Therapy  here for Lt TKA       Observation/Other Assessments-Edema    Edema  Circumferential      Circumferential Edema   Circumferential - Right  Knee: 41.5 cm, calf: 43.4    Circumferential - Left   Knee: 39 cm, calf: 42.2      AROM   Right Knee Extension  -8    Right Knee Flexion  122  (during AAROM stretch into flexion)       [facility] Adult PT Treatment/Exercise - 10/17/18 0001      Knee/Hip Exercises: Stretches   Passive Hamstring Stretch  Right;30 seconds;3 reps   long sitting, overpressure from pt into knee ext    Knee: Self-Stretch to increase Flexion  Right;4 reps;20 seconds   foot on back of truck   Press photographer  Both;30 seconds;3 reps   incline board      Knee/Hip Exercises: Aerobic   Recumbent Bike  L1-2: 7.5 min    PTA present to discuss progress   Other Aerobic  laps around gym in between exercises to decrease stiffness, cues to increase Rt heel strike.       Knee/Hip Exercises: Seated   Sit to Sand  without UE support;10 reps   slow eccentric, Lt foot forward - to black mat     Knee/Hip Exercises: Prone   Prone Knee Hang  1 minute   2 reps      Moist Heat Therapy   Number Minutes Moist Heat  10 Minutes    Moist Heat Location  --   thoracic spine (per pt request)     Vasopneumatic   Number Minutes Vasopneumatic   10 minutes   Legs on double bolster elevation   Vasopnuematic Location   Knee   Rt    Vasopneumatic Pressure  Low    Vasopneumatic Temperature   34 deg      Manual Therapy   Soft tissue mobilization  STM to Rt hamstring and prox calf.     Passive ROM  overpressure into Rt knee ext x 20 sec x 5 reps        PT Long Term Goals - 10/17/18  1502      PT LONG TERM GOAL #1   Title  Increase ROM Rt knee to 0 deg extension - 125 deg flexion 11/14/2018    Time  14    Period  Weeks    Status  Revised      PT LONG TERM GOAL #2   Title  5/5 strength Rt LE 11/14/2018    Time  14    Period  Weeks    Status  Revised      PT LONG TERM GOAL #3   Title  Patient tolerating 45-60 min of standing and or walking at work with minimal difficulty 11/14/2018    Time  14    Period  Weeks    Status  Revised      PT LONG TERM GOAL #4   Title  Independent in HEP 11/14/2018    Time  14    Period  Weeks    Status  Revised      PT LONG TERM GOAL #5   Title  Improve FOTO to </= 43% limitation 11/14/2018    Time  14    Period  Weeks    Status  Revised            Plan - 10/17/18 1546    Clinical Impression Statement  Persistant swelling in RLE since returning to work with increased standing hours.  Rt knee ROM  8-122 deg flexion. Encouraged pt to remain diligent with ROM HEP exercises and wear some sort of compression to decrease edema in LE.  Pt remains motivated to progress towards established therapy goals.     Rehab Potential  Good    PT Frequency  2x / week    PT Duration  4 weeks    PT Treatment/Interventions  Patient/family education;ADLs/Self Care Home Management;Cryotherapy;Electrical Stimulation;Iontophoresis 4mg /ml Dexamethasone;Moist Heat;Ultrasound;Dry needling;Manual techniques;Gait training;Stair training;Functional mobility training;Therapeutic activities;Therapeutic exercise;Balance training;Neuromuscular re-education;Taping    PT Next  Visit Plan  continued focus on ROM and functional strengthening.     PT Home Exercise Plan  Access Code: [email or web address]    Consulted and Agree with Plan of Care  Patient       Patient will benefit from skilled therapeutic intervention in order to improve the following deficits and impairments:  Increased muscle spasms, Increased fascial restricitons, Decreased mobility, Decreased range  of motion, Abnormal gait, Decreased activity tolerance, Decreased endurance, Decreased strength, Pain  Visit Diagnosis: Acute pain of right knee  Muscle weakness (generalized)  Localized edema  Other abnormalities of gait and mobility     Problem List Patient Active Problem List   Diagnosis Date Noted  . Acute cystitis without hematuria 03/21/2018  . History of left knee replacement 02/10/2018  . Morbid obesity ([place]) 11/21/2017  . Primary osteoarthritis of left knee 09/22/2017  . Primary osteoarthritis of right knee 09/22/2017  . Attention deficit hyperactivity disorder (ADHD) 05/12/2016  . Episodic mood disorder ([place]) 05/12/2016  . Type 2 diabetes mellitus without complication, without long-term current use of insulin ([place]) 05/12/2016  . Gastroesophageal reflux disease without esophagitis 05/12/2016  . DYSPNEA 07/22/2010  . CHEST PAIN 07/22/2010  . Hypothyroidism 09/13/2007  . Hyperlipidemia 09/13/2007  . ANXIETY DEPRESSION 09/13/2007  . MIGRAINE HEADACHE 09/13/2007  . Essential hypertension 09/13/2007  . ASTHMA 09/13/2007  . CHOLELITHIASIS, HX OF 09/13/2007  . NEPHROLITHIASIS, HX OF 09/13/2007  . CARPAL TUNNEL RELEASE, HX OF 09/13/2007   Kerin Perna, PTA 10/17/18 3:52 PM   Celyn P. Helene Kelp PT, MPH 10/17/18 3:56 PM   [facility] [place], [place], [address] Phone: [phone]   Fax:  [phone]  Name: SEDONIA KITNER MRN: [id number] Date of Birth: 1971/02/07

## 2018-10-18 ENCOUNTER — Encounter: Payer: BLUE CROSS/BLUE SHIELD | Admitting: Physician Assistant

## 2018-10-20 ENCOUNTER — Ambulatory Visit (INDEPENDENT_AMBULATORY_CARE_PROVIDER_SITE_OTHER): Payer: BLUE CROSS/BLUE SHIELD | Admitting: Physical Therapy

## 2018-10-20 ENCOUNTER — Other Ambulatory Visit: Payer: Self-pay

## 2018-10-20 DIAGNOSIS — M25561 Pain in right knee: Secondary | ICD-10-CM

## 2018-10-20 DIAGNOSIS — M6281 Muscle weakness (generalized): Secondary | ICD-10-CM

## 2018-10-20 DIAGNOSIS — R2689 Other abnormalities of gait and mobility: Secondary | ICD-10-CM | POA: Diagnosis not present

## 2018-10-20 DIAGNOSIS — R600 Localized edema: Secondary | ICD-10-CM | POA: Diagnosis not present

## 2018-10-20 NOTE — Therapy (Signed)
[place] [place], [place], [address] Phone: [phone]   Fax:  [phone]  Physical Therapy Treatment  Patient Details  Name: Judith [name] MRN: [id number] Date of Birth: February 22, 1971 Referring Provider (PT): Dr Levan Hurst    Encounter Date: 10/20/2018  PT End of Session - 10/20/18 1510    Visit Number  16    Number of Visits  28    Date for PT Re-Evaluation  11/14/18    PT Start Time  0102    PT Stop Time  1546    PT Time Calculation (min)  48 min    Activity Tolerance  Patient tolerated treatment well    Behavior During Therapy  [facility] for tasks assessed/performed       Past Medical History:  Diagnosis Date  . Asthma   . Bipolar 1 disorder ([place])   . Depression   . GERD (gastroesophageal reflux disease)   . Hyperlipemia   . Hypothyroid   . Migraines     Past Surgical History:  Procedure Laterality Date  . ABDOMINAL SURGERY    . CARPAL TUNNEL RELEASE Bilateral 1999  . CESAREAN SECTION    . CHOLECYSTECTOMY    . THYROIDECTOMY    . TONSILLECTOMY      There were no vitals filed for this visit.  Subjective Assessment - 10/20/18 1513    Subjective  "My knee hurt so bad after the last visit.  I couldn't go to work the next day, I had to lay in bed.  I don't want you to push on my knee".     Currently in Pain?  Yes    Pain Score  3     Pain Location  Knee    Pain Orientation  Right    Pain Descriptors / Indicators  Aching;Tightness    Aggravating Factors   straightening knee too much    Pain Relieving Factors  stretching, massage, rest, ice       [facility] Adult PT Treatment/Exercise - 10/20/18 0001      Knee/Hip Exercises: Stretches   Passive Hamstring Stretch  Right;30 seconds;4 reps   long sitting,with opp leg off of table   Knee: Self-Stretch to increase Flexion  Right;4 reps;20 seconds   foot on back of truck   Press photographer  Both;30 seconds;4 reps   incline board      Knee/Hip  Exercises: Aerobic   Tread Mill  2.0 mph x 3 min with focus on even steps and increased Rt heel strike; retro step @ 1.0 mph with focus on TKE     Recumbent Bike  L1: 5 min     Other Aerobic  laps around gym in between exercises to decrease stiffness, cues to increase Rt heel strike.       Knee/Hip Exercises: Seated   Sit to Sand  without UE support;5 reps   slow eccentric, Lt foot forward - to black mat     Knee/Hip Exercises: Supine   Short Arc Quad Sets  Strengthening;Right;10 reps;2 sets   thigh resting on green noodle slow release from straight      Knee/Hip Exercises: Prone   Hamstring Curl  5 reps;2 sets   in between prone hang.    Prone Knee Hang  1 minute   2 reps    Prone Knee Hang Weights (lbs)  1.5#    1   Other Prone Exercises  quad set prone straightening knee with toe down 5  sec hold x 10       Moist Heat Therapy   Number Minutes Moist Heat  10 Minutes    Moist Heat Location  --   lumbar spine (per pt request)     Vasopneumatic   Number Minutes Vasopneumatic   10 minutes   Legs on double bolster elevation   Vasopnuematic Location   Knee   Rt      Manual Therapy   Manual therapy comments  I strip of Rock tape applied with 15 % stretch to Lt medial knee and Rt shin, to decompress tissue, decrease pain.     Soft tissue mobilization  STM and IASTM to Rt hamstring and prox calf.       Kinesiotix   Edema  Reg rock tape applied in squid pattern across ant Rt knee to assist with edema reduciton.                   PT Long Term Goals - 10/17/18 1502      PT LONG TERM GOAL #1   Title  Increase ROM Rt knee to 0 deg extension - 125 deg flexion 11/14/2018    Time  14    Period  Weeks    Status  Revised      PT LONG TERM GOAL #2   Title  5/5 strength Rt LE 11/14/2018    Time  14    Period  Weeks    Status  Revised      PT LONG TERM GOAL #3   Title  Patient tolerating 45-60 min of standing and or walking at work with minimal difficulty 11/14/2018     Time  14    Period  Weeks    Status  Revised      PT LONG TERM GOAL #4   Title  Independent in HEP 11/14/2018    Time  14    Period  Weeks    Status  Revised      PT LONG TERM GOAL #5   Title  Improve FOTO to </= 43% limitation 11/14/2018    Time  14    Period  Weeks    Status  Revised            Plan - 10/20/18 1736    Clinical Impression Statement  Focus today was on gentle ROM, especially extension of Rt knee.  Pt reported reduction of tigtness in knee at end of session.  Goals are ongoing at this time.     Rehab Potential  Good    PT Frequency  2x / week    PT Duration  4 weeks    PT Treatment/Interventions  Patient/family education;ADLs/Self Care Home Management;Cryotherapy;Electrical Stimulation;Iontophoresis 4mg /ml Dexamethasone;Moist Heat;Ultrasound;Dry needling;Manual techniques;Gait training;Stair training;Functional mobility training;Therapeutic activities;Therapeutic exercise;Balance training;Neuromuscular re-education;Taping    PT Next Visit Plan  continued focus on ROM and functional strengthening - focus on terminal knee extension    PT Home Exercise Plan  Access Code: [email or web address]    Consulted and Agree with Plan of Care  Patient       Patient will benefit from skilled therapeutic intervention in order to improve the following deficits and impairments:  Increased muscle spasms, Increased fascial restricitons, Decreased mobility, Decreased range of motion, Abnormal gait, Decreased activity tolerance, Decreased endurance, Decreased strength, Pain  Visit Diagnosis: Acute pain of right knee  Muscle weakness (generalized)  Localized edema  Other abnormalities of gait and mobility     Problem List Patient Active Problem  List   Diagnosis Date Noted  . Acute cystitis without hematuria 03/21/2018  . History of left knee replacement 02/10/2018  . Morbid obesity ([place]) 11/21/2017  . Primary osteoarthritis of left knee 09/22/2017  . Primary  osteoarthritis of right knee 09/22/2017  . Attention deficit hyperactivity disorder (ADHD) 05/12/2016  . Episodic mood disorder ([place]) 05/12/2016  . Type 2 diabetes mellitus without complication, without long-term current use of insulin ([place]) 05/12/2016  . Gastroesophageal reflux disease without esophagitis 05/12/2016  . DYSPNEA 07/22/2010  . CHEST PAIN 07/22/2010  . Hypothyroidism 09/13/2007  . Hyperlipidemia 09/13/2007  . ANXIETY DEPRESSION 09/13/2007  . MIGRAINE HEADACHE 09/13/2007  . Essential hypertension 09/13/2007  . ASTHMA 09/13/2007  . CHOLELITHIASIS, HX OF 09/13/2007  . NEPHROLITHIASIS, HX OF 09/13/2007  . CARPAL TUNNEL RELEASE, HX OF 09/13/2007   Kerin Perna, PTA 10/20/18 5:42 PM  [place], [place], [address] Phone: [phone]   Fax:  [phone]  Name: MARISELA LINE MRN: [id number] Date of Birth: 08/10/1970

## 2018-10-21 ENCOUNTER — Telehealth: Payer: Self-pay | Admitting: *Deleted

## 2018-10-21 ENCOUNTER — Encounter: Payer: Self-pay | Admitting: Physician Assistant

## 2018-10-21 ENCOUNTER — Telehealth: Payer: Self-pay | Admitting: Physician Assistant

## 2018-10-21 ENCOUNTER — Ambulatory Visit (INDEPENDENT_AMBULATORY_CARE_PROVIDER_SITE_OTHER): Payer: BLUE CROSS/BLUE SHIELD | Admitting: Physician Assistant

## 2018-10-21 ENCOUNTER — Other Ambulatory Visit: Payer: BLUE CROSS/BLUE SHIELD

## 2018-10-21 DIAGNOSIS — R110 Nausea: Secondary | ICD-10-CM | POA: Diagnosis not present

## 2018-10-21 DIAGNOSIS — R05 Cough: Secondary | ICD-10-CM | POA: Diagnosis not present

## 2018-10-21 DIAGNOSIS — Z20822 Contact with and (suspected) exposure to covid-19: Secondary | ICD-10-CM

## 2018-10-21 DIAGNOSIS — R062 Wheezing: Secondary | ICD-10-CM

## 2018-10-21 DIAGNOSIS — M791 Myalgia, unspecified site: Secondary | ICD-10-CM

## 2018-10-21 DIAGNOSIS — R0981 Nasal congestion: Secondary | ICD-10-CM

## 2018-10-21 DIAGNOSIS — R059 Cough, unspecified: Secondary | ICD-10-CM

## 2018-10-21 DIAGNOSIS — R6889 Other general symptoms and signs: Secondary | ICD-10-CM | POA: Diagnosis not present

## 2018-10-21 MED ORDER — BUDESONIDE-FORMOTEROL FUMARATE 160-4.5 MCG/ACT IN AERO: 2.0000 | INHALATION_SPRAY | Freq: Two times a day (BID) | RESPIRATORY_TRACT | 3 refills | Status: DC

## 2018-10-21 MED ORDER — AZITHROMYCIN 250 MG PO TABS: ORAL_TABLET | ORAL | 0 refills | Status: DC

## 2018-10-21 NOTE — Telephone Encounter (Signed)
Please review and advise.

## 2018-10-21 NOTE — Progress Notes (Signed)
Telephone visit  Subjective: [id number] symptoms PCP: Terald Sleeper, PA-C HPI:Judith [name] is a 48 y.o. female calls for telephone consult today. Patient provides verbal consent for consult held via phone.  Patient is identified with 2 separate identifiers.  At this time the entire area is on COVID-19 social distancing and stay home orders are in place.  Patient is of higher risk and therefore we are performing this by a virtual method.  Location of patient: home Location of provider: [facility] Others present for call: no  Patient works in [employer] and has been back at work 2 to 3 weeks.  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  She has had just 1 day of all of the symptoms: Cough, congestion of the nasal sinus, myalgia, nausea, wheeze.  We will send for COVID testing through the community pool.   ROS: Per HPI  Allergies  Allergen Reactions  . Diphenhydramine Other (See Comments)    Hair feels like it is crawling  . Morphine Nausea And Vomiting    PCA PUMP N/V IV PUSH IN ER NO PROBLEM PER PT.  . Prednisone Nausea And Vomiting  . Erythromycin Hives  . Sulfonamide Derivatives Other (See Comments)    Does not take due to family history   Past Medical History:  Diagnosis Date  . Asthma   . Bipolar 1 disorder ([place])   . Depression   . GERD (gastroesophageal reflux disease)   . Hyperlipemia   . Hypothyroid   . Migraines     Current Outpatient Medications:  .  albuterol (PROVENTIL) (2.5 MG/3ML) 0.083% nebulizer solution, Nebulize 1 vial three times daily, Disp: 150 mL, Rfl: 2 .  aspirin EC 81 MG tablet, Take 162 mg by mouth daily. , Disp: , Rfl:  .  atorvastatin (LIPITOR) 10 MG tablet, Take 1 tablet (10 mg total) by mouth daily., Disp: 90 tablet, Rfl: 3 .  budesonide-formoterol (SYMBICORT) 160-4.5 MCG/ACT inhaler, Inhale 2 puffs into the lungs 2 (two) times daily., Disp: 1 Inhaler, Rfl: 3 .  busPIRone (BUSPAR) 15 MG  tablet, Take 15 mg by mouth 3 (three) times daily. , Disp: , Rfl:  .  cetirizine (ZYRTEC) 10 MG tablet, Take 1 tablet (10 mg total) by mouth daily. (Patient taking differently: Take 20 mg by mouth 2 (two) times daily. ), Disp: 30 tablet, Rfl: 11 .  diclofenac sodium (VOLTAREN) 1 % GEL, Apply 4 g topically 4 (four) times daily., Disp: 400 g, Rfl: 2 .  dicyclomine (BENTYL) 20 MG tablet, Take 1 tablet (20 mg total) by mouth every 6 (six) hours., Disp: 60 tablet, Rfl: 5 .  doxepin (SINEQUAN) 25 MG capsule, Take 25 mg by mouth at bedtime. , Disp: , Rfl:  .  fluconazole (DIFLUCAN) 150 MG tablet, 1 po q week x 4 weeks, Disp: 4 tablet, Rfl: 2 .  hydrOXYzine (ATARAX/VISTARIL) 25 MG tablet, Take 1 tablet (25 mg total) by mouth daily. (Patient taking differently: Take 25 mg by mouth 3 (three) times daily. ), Disp: 30 tablet, Rfl: 1 .  ibuprofen (ADVIL) 800 MG tablet, TAKE 1 TABLET BY MOUTH EVERY 8 HOURS AS NEEDED, Disp: 90 tablet, Rfl: 2 .  lamoTRIgine (LAMICTAL) 150 MG tablet, Take 150 mg by mouth 2 (two) times daily., Disp: , Rfl:  .  levothyroxine (SYNTHROID, LEVOTHROID) 125 MCG tablet, TAKE ONE TAB BY MOUTH DAILY, Disp: 90 tablet, Rfl: 1 .  Lisdexamfetamine Dimesylate (VYVANSE PO), Take 70 mg by mouth daily. , Disp: , Rfl:  .  lisinopril (PRINIVIL,ZESTRIL) 20 MG tablet, Take 20 mg by mouth daily., Disp: , Rfl:  .  ondansetron (ZOFRAN ODT) 8 MG disintegrating tablet, Take 1 tablet (8 mg total) by mouth every 8 (eight) hours as needed for nausea or vomiting., Disp: 20 tablet, Rfl: 0 .  pantoprazole (PROTONIX) 40 MG tablet, TAKE ONE TAB BY MOUTH 2 TIMES DAILY, Disp: 180 tablet, Rfl: 0 .  pioglitazone (ACTOS) 15 MG tablet, TAKE 1 TABLET BY MOUTH DAILY, Disp: 90 tablet, Rfl: 0 .  sertraline (ZOLOFT) 100 MG tablet, Take 200 mg by mouth daily. , Disp: , Rfl:  .  silver sulfADIAZINE (SILVADENE) 1 % cream, Apply topically., Disp: , Rfl:  .  SUMAtriptan Succinate 4 MG/0.5ML SOCT, Inject 4 mg into the skin once.,  Disp: 9 Cartridge, Rfl: 11 .  traMADol (ULTRAM) 50 MG tablet, Take 1 tablet (50 mg total) by mouth every 8 (eight) hours as needed for severe pain., Disp: 90 tablet, Rfl: 5 .  azithromycin (ZITHROMAX Z-PAK) 250 MG tablet, Take as directed, Disp: 6 each, Rfl: 0  Assessment/ Plan: 48 y.o. female   1. Cough  2. Congestion of nasal sinus  3. Myalgia  4. Nausea  5. Wheeze  Patient except for COVID testing for the above diagnoses.  We will send a prescription for Zithromax to the pharmacy along with a refill for her Symbicort.   Start time: 10:25 AM End time: 10:31 AM  Meds ordered this encounter  Medications  . budesonide-formoterol (SYMBICORT) 160-4.5 MCG/ACT inhaler    Sig: Inhale 2 puffs into the lungs 2 (two) times daily.    Dispense:  1 Inhaler    Refill:  3    Order Specific Question:   Supervising Provider    Answer:   Janora Norlander [[id number]]  . azithromycin (ZITHROMAX Z-PAK) 250 MG tablet    Sig: Take as directed    Dispense:  6 each    Refill:  0    Order Specific Question:   Supervising Provider    Answer:   Janora Norlander [[id number]]    Particia Nearing PA-C [place] [phone]

## 2018-10-21 NOTE — Telephone Encounter (Addendum)
Contacted pt to schedule testing; pt offered and accepted appointment at [facility] site 10/21/2018 at 1200; pt given address, location, and instructions that she and all occupants of her vehicle wear masks; she verbalized understanding; orders placed per protocol.   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  She has had just 1 day of all of the symptoms: Cough, congestion of the nasal sinus, myalgia, nausea, wheeze.  We will send for COVID testing through the community pool.  Particia Nearing

## 2018-10-24 ENCOUNTER — Ambulatory Visit: Payer: BLUE CROSS/BLUE SHIELD | Admitting: Physician Assistant

## 2018-10-24 ENCOUNTER — Encounter: Payer: BLUE CROSS/BLUE SHIELD | Admitting: Physical Therapy

## 2018-10-24 LAB — NOVEL CORONAVIRUS, NAA: SARS-CoV-2, NAA: NOT DETECTED

## 2018-10-25 ENCOUNTER — Other Ambulatory Visit: Payer: Self-pay

## 2018-10-25 ENCOUNTER — Ambulatory Visit (INDEPENDENT_AMBULATORY_CARE_PROVIDER_SITE_OTHER): Payer: BLUE CROSS/BLUE SHIELD

## 2018-10-25 ENCOUNTER — Ambulatory Visit (INDEPENDENT_AMBULATORY_CARE_PROVIDER_SITE_OTHER): Payer: BLUE CROSS/BLUE SHIELD | Admitting: Family Medicine

## 2018-10-25 DIAGNOSIS — R0602 Shortness of breath: Secondary | ICD-10-CM | POA: Diagnosis not present

## 2018-10-25 DIAGNOSIS — R079 Chest pain, unspecified: Secondary | ICD-10-CM | POA: Diagnosis not present

## 2018-10-25 DIAGNOSIS — J069 Acute upper respiratory infection, unspecified: Secondary | ICD-10-CM

## 2018-10-25 MED ORDER — METHYLPREDNISOLONE 4 MG PO TBPK: ORAL_TABLET | ORAL | 0 refills | Status: DC

## 2018-10-25 NOTE — Progress Notes (Signed)
Telephone visit  Subjective: CC: shortness of breath/ cough PCP: Terald Sleeper, PA-C HPI:Judith [name] is a 48 y.o. female calls for telephone consult today. Patient provides verbal consent for consult held via phone.  Location of patient: home Location of provider: Working remotely from home Others present for call: none  1.  Cough/URI Patient with several day history of cough, sinus congestion and other flulike symptoms.  She was tested for COVID-19 recently and notes that she had a negative result yesterday.  She is just finishing a Z-Pak.  She notes she still has quite a bit of sensitivity along the back of her head.  She continues to have pressure within her chest that makes her feel like she cannot take a deep breath in.  She is using the inhaler which she does feel is helping but notes that it is not long lasting improvement in breathing.  No hemoptysis.   ROS: Per HPI  Allergies  Allergen Reactions  . Diphenhydramine Other (See Comments)    Hair feels like it is crawling  . Morphine Nausea And Vomiting    PCA PUMP N/V IV PUSH IN ER NO PROBLEM PER PT.  . Prednisone Nausea And Vomiting  . Erythromycin Hives  . Sulfonamide Derivatives Other (See Comments)    Does not take due to family history   Past Medical History:  Diagnosis Date  . Asthma   . Bipolar 1 disorder ([place])   . Depression   . GERD (gastroesophageal reflux disease)   . Hyperlipemia   . Hypothyroid   . Migraines     Current Outpatient Medications:  .  albuterol (PROVENTIL) (2.5 MG/3ML) 0.083% nebulizer solution, Nebulize 1 vial three times daily, Disp: 150 mL, Rfl: 2 .  aspirin EC 81 MG tablet, Take 162 mg by mouth daily. , Disp: , Rfl:  .  atorvastatin (LIPITOR) 10 MG tablet, Take 1 tablet (10 mg total) by mouth daily., Disp: 90 tablet, Rfl: 3 .  azithromycin (ZITHROMAX Z-PAK) 250 MG tablet, Take as directed, Disp: 6 each, Rfl: 0 .  budesonide-formoterol (SYMBICORT) 160-4.5 MCG/ACT inhaler, Inhale 2  puffs into the lungs 2 (two) times daily., Disp: 1 Inhaler, Rfl: 3 .  busPIRone (BUSPAR) 15 MG tablet, Take 15 mg by mouth 3 (three) times daily. , Disp: , Rfl:  .  cetirizine (ZYRTEC) 10 MG tablet, Take 1 tablet (10 mg total) by mouth daily. (Patient taking differently: Take 20 mg by mouth 2 (two) times daily. ), Disp: 30 tablet, Rfl: 11 .  diclofenac sodium (VOLTAREN) 1 % GEL, Apply 4 g topically 4 (four) times daily., Disp: 400 g, Rfl: 2 .  dicyclomine (BENTYL) 20 MG tablet, Take 1 tablet (20 mg total) by mouth every 6 (six) hours., Disp: 60 tablet, Rfl: 5 .  doxepin (SINEQUAN) 25 MG capsule, Take 25 mg by mouth at bedtime. , Disp: , Rfl:  .  fluconazole (DIFLUCAN) 150 MG tablet, 1 po q week x 4 weeks, Disp: 4 tablet, Rfl: 2 .  hydrOXYzine (ATARAX/VISTARIL) 25 MG tablet, Take 1 tablet (25 mg total) by mouth daily. (Patient taking differently: Take 25 mg by mouth 3 (three) times daily. ), Disp: 30 tablet, Rfl: 1 .  ibuprofen (ADVIL) 800 MG tablet, TAKE 1 TABLET BY MOUTH EVERY 8 HOURS AS NEEDED, Disp: 90 tablet, Rfl: 2 .  lamoTRIgine (LAMICTAL) 150 MG tablet, Take 150 mg by mouth 2 (two) times daily., Disp: , Rfl:  .  levothyroxine (SYNTHROID, LEVOTHROID) 125 MCG tablet, TAKE ONE  TAB BY MOUTH DAILY, Disp: 90 tablet, Rfl: 1 .  Lisdexamfetamine Dimesylate (VYVANSE PO), Take 70 mg by mouth daily. , Disp: , Rfl:  .  lisinopril (PRINIVIL,ZESTRIL) 20 MG tablet, Take 20 mg by mouth daily., Disp: , Rfl:  .  ondansetron (ZOFRAN ODT) 8 MG disintegrating tablet, Take 1 tablet (8 mg total) by mouth every 8 (eight) hours as needed for nausea or vomiting., Disp: 20 tablet, Rfl: 0 .  pantoprazole (PROTONIX) 40 MG tablet, TAKE ONE TAB BY MOUTH 2 TIMES DAILY, Disp: 180 tablet, Rfl: 0 .  pioglitazone (ACTOS) 15 MG tablet, TAKE 1 TABLET BY MOUTH DAILY, Disp: 90 tablet, Rfl: 0 .  sertraline (ZOLOFT) 100 MG tablet, Take 200 mg by mouth daily. , Disp: , Rfl:  .  silver sulfADIAZINE (SILVADENE) 1 % cream, Apply  topically., Disp: , Rfl:  .  SUMAtriptan Succinate 4 MG/0.5ML SOCT, Inject 4 mg into the skin once., Disp: 9 Cartridge, Rfl: 11 .  traMADol (ULTRAM) 50 MG tablet, Take 1 tablet (50 mg total) by mouth every 8 (eight) hours as needed for severe pain., Disp: 90 tablet, Rfl: 5  Assessment/ Plan: 48 y.o. female   1. URI with cough and congestion Because she is not having significant response to the Z-Pak this is likely viral in nature.  However, given ongoing sensation of shortness of breath and what appears to be bronchospasm I have gone ahead and prescribed a Medrol Dosepak.  She is had issues with prednisone in the past but has tolerated Depo-Medrol here in the office.  We discussed reasons for emergent evaluation in the emergency department.  I am going to try and arrange an outpatient chest x-ray to rule out any other abnormalities.  She is to complete the Z-Pak as directed. - methylPREDNISolone (MEDROL DOSEPAK) 4 MG TBPK tablet; Take as directed  Dispense: 21 tablet; Refill: 0 - DG Chest 2 View; Future  2. Shortness of breath As above - methylPREDNISolone (MEDROL DOSEPAK) 4 MG TBPK tablet; Take as directed  Dispense: 21 tablet; Refill: 0 - DG Chest 2 View; Future   Start time: 2:23pm End time: 2:39pm  Total time spent on patient care (including telephone call/ virtual visit): 19 minutes  [place], [place] [phone]

## 2018-10-27 ENCOUNTER — Encounter: Payer: BLUE CROSS/BLUE SHIELD | Admitting: Physical Therapy

## 2018-10-31 ENCOUNTER — Ambulatory Visit (INDEPENDENT_AMBULATORY_CARE_PROVIDER_SITE_OTHER): Payer: BLUE CROSS/BLUE SHIELD | Admitting: Physician Assistant

## 2018-10-31 ENCOUNTER — Other Ambulatory Visit: Payer: Self-pay

## 2018-10-31 ENCOUNTER — Encounter: Payer: Self-pay | Admitting: Physician Assistant

## 2018-10-31 DIAGNOSIS — Z96651 Presence of right artificial knee joint: Secondary | ICD-10-CM | POA: Diagnosis not present

## 2018-10-31 DIAGNOSIS — M1711 Unilateral primary osteoarthritis, right knee: Secondary | ICD-10-CM | POA: Diagnosis not present

## 2018-10-31 MED ORDER — NYSTATIN 100000 UNIT/ML MT SUSP: 5.0000 mL | Freq: Four times a day (QID) | OROMUCOSAL | 0 refills | Status: DC

## 2018-10-31 NOTE — Progress Notes (Signed)
Telephone visit  Subjective: XN:ATFTDDU on disability PCP: Terald Sleeper, PA-C HPI:Judith [name] is a 48 y.o. female calls for telephone consult today. Patient provides verbal consent for consult held via phone.  Patient is identified with 2 separate identifiers.  At this time the entire area is on COVID-19 social distancing and stay home orders are in place.  Patient is of higher risk and therefore we are performing this by a virtual method.  Location of patient: home Location of provider: HOME Others present for call: no  This patient is for periodic recheck on her chronic medical condition.  She had been on short-term disability related to her knee.  She had her right knee replaced and has been doing physical therapy since she was able to.  However she has tried to go back to work in the past month or so.  And every time she reports she has a significant amount of swelling.  Whenever she gets home she is unable to do much stretching and exercising.  She does feel like she has regressed in her healing of the leg.  She works in Armed forces training and education officer.  At this time time she does have the option to take long-term disability for the knee.  We will write a note for her to be out from 10/31/2018 through 02/15/2019.  We should be expecting long-term disability papers coming from [place].  A note will be sent to her managers.  Hopefully we can get her knee come down once and for all.  I will put in a new order for physical therapy to be continued.  Patient is also been having some thrush in her mouth related to her Symbicort.  We will send some nystatin for this.   ROS: Per HPI  Allergies  Allergen Reactions  . Diphenhydramine Other (See Comments)    Hair feels like it is crawling  . Morphine Nausea And Vomiting    PCA PUMP N/V IV PUSH IN ER NO PROBLEM PER PT.  . Prednisone Nausea And Vomiting  . Erythromycin Hives  . Sulfonamide Derivatives Other (See Comments)    Does not take due to family  history   Past Medical History:  Diagnosis Date  . Asthma   . Bipolar 1 disorder ([place])   . Depression   . GERD (gastroesophageal reflux disease)   . Hyperlipemia   . Hypothyroid   . Migraines     Current Outpatient Medications:  .  albuterol (PROVENTIL) (2.5 MG/3ML) 0.083% nebulizer solution, Nebulize 1 vial three times daily, Disp: 150 mL, Rfl: 2 .  aspirin EC 81 MG tablet, Take 162 mg by mouth daily. , Disp: , Rfl:  .  atorvastatin (LIPITOR) 10 MG tablet, Take 1 tablet (10 mg total) by mouth daily., Disp: 90 tablet, Rfl: 3 .  azithromycin (ZITHROMAX Z-PAK) 250 MG tablet, Take as directed, Disp: 6 each, Rfl: 0 .  budesonide-formoterol (SYMBICORT) 160-4.5 MCG/ACT inhaler, Inhale 2 puffs into the lungs 2 (two) times daily., Disp: 1 Inhaler, Rfl: 3 .  busPIRone (BUSPAR) 15 MG tablet, Take 15 mg by mouth 3 (three) times daily. , Disp: , Rfl:  .  cetirizine (ZYRTEC) 10 MG tablet, Take 1 tablet (10 mg total) by mouth daily. (Patient taking differently: Take 20 mg by mouth 2 (two) times daily. ), Disp: 30 tablet, Rfl: 11 .  diclofenac sodium (VOLTAREN) 1 % GEL, Apply 4 g topically 4 (four) times daily., Disp: 400 g, Rfl: 2 .  dicyclomine (BENTYL) 20  MG tablet, Take 1 tablet (20 mg total) by mouth every 6 (six) hours., Disp: 60 tablet, Rfl: 5 .  doxepin (SINEQUAN) 25 MG capsule, Take 25 mg by mouth at bedtime. , Disp: , Rfl:  .  fluconazole (DIFLUCAN) 150 MG tablet, 1 po q week x 4 weeks, Disp: 4 tablet, Rfl: 2 .  hydrOXYzine (ATARAX/VISTARIL) 25 MG tablet, Take 1 tablet (25 mg total) by mouth daily. (Patient taking differently: Take 25 mg by mouth 3 (three) times daily. ), Disp: 30 tablet, Rfl: 1 .  ibuprofen (ADVIL) 800 MG tablet, TAKE 1 TABLET BY MOUTH EVERY 8 HOURS AS NEEDED, Disp: 90 tablet, Rfl: 2 .  lamoTRIgine (LAMICTAL) 150 MG tablet, Take 150 mg by mouth 2 (two) times daily., Disp: , Rfl:  .  levothyroxine (SYNTHROID, LEVOTHROID) 125 MCG tablet, TAKE ONE TAB BY MOUTH DAILY, Disp: 90  tablet, Rfl: 1 .  Lisdexamfetamine Dimesylate (VYVANSE PO), Take 70 mg by mouth daily. , Disp: , Rfl:  .  lisinopril (PRINIVIL,ZESTRIL) 20 MG tablet, Take 20 mg by mouth daily., Disp: , Rfl:  .  methylPREDNISolone (MEDROL DOSEPAK) 4 MG TBPK tablet, Take as directed, Disp: 21 tablet, Rfl: 0 .  nystatin (MYCOSTATIN) 100000 UNIT/ML suspension, Take 5 mLs (500,000 Units total) by mouth 4 (four) times daily., Disp: 60 mL, Rfl: 0 .  ondansetron (ZOFRAN ODT) 8 MG disintegrating tablet, Take 1 tablet (8 mg total) by mouth every 8 (eight) hours as needed for nausea or vomiting., Disp: 20 tablet, Rfl: 0 .  pantoprazole (PROTONIX) 40 MG tablet, TAKE ONE TAB BY MOUTH 2 TIMES DAILY, Disp: 180 tablet, Rfl: 0 .  pioglitazone (ACTOS) 15 MG tablet, TAKE 1 TABLET BY MOUTH DAILY, Disp: 90 tablet, Rfl: 0 .  sertraline (ZOLOFT) 100 MG tablet, Take 200 mg by mouth daily. , Disp: , Rfl:  .  silver sulfADIAZINE (SILVADENE) 1 % cream, Apply topically., Disp: , Rfl:  .  SUMAtriptan Succinate 4 MG/0.5ML SOCT, Inject 4 mg into the skin once., Disp: 9 Cartridge, Rfl: 11 .  traMADol (ULTRAM) 50 MG tablet, Take 1 tablet (50 mg total) by mouth every 8 (eight) hours as needed for severe pain., Disp: 90 tablet, Rfl: 5  Assessment/ Plan: 48 y.o. female   1. Primary osteoarthritis of right knee - Ambulatory referral to Physical Therapy  2. Status post right knee replacement - Ambulatory referral to Physical Therapy  Out of work 10/31/2018 through 02/15/2019 Continue all other maintenance medications as listed above.  Start time: 9:27 AM End time: 9:40 AM  Meds ordered this encounter  Medications  . nystatin (MYCOSTATIN) 100000 UNIT/ML suspension    Sig: Take 5 mLs (500,000 Units total) by mouth 4 (four) times daily.    Dispense:  60 mL    Refill:  0    Order Specific Question:   Supervising Provider    Answer:   Janora Norlander [[id number]]    Particia Nearing PA-C [place] [phone]

## 2018-11-01 ENCOUNTER — Encounter: Payer: Self-pay | Admitting: Rehabilitative and Restorative Service Providers"

## 2018-11-02 ENCOUNTER — Encounter (HOSPITAL_COMMUNITY): Payer: Self-pay | Admitting: Emergency Medicine

## 2018-11-02 ENCOUNTER — Emergency Department (HOSPITAL_COMMUNITY)
Admission: EM | Admit: 2018-11-02 | Discharge: 2018-11-03 | Disposition: A | Discharge status: Home / Self Care | Payer: BC Managed Care – PPO | Attending: Emergency Medicine | Admitting: Emergency Medicine

## 2018-11-02 ENCOUNTER — Emergency Department (HOSPITAL_COMMUNITY): Payer: BC Managed Care – PPO

## 2018-11-02 ENCOUNTER — Encounter: Payer: Self-pay | Admitting: Physical Therapy

## 2018-11-02 ENCOUNTER — Other Ambulatory Visit: Payer: Self-pay

## 2018-11-02 ENCOUNTER — Ambulatory Visit (INDEPENDENT_AMBULATORY_CARE_PROVIDER_SITE_OTHER): Payer: BC Managed Care – PPO | Admitting: Physical Therapy

## 2018-11-02 VITALS — BP 159/111 | HR 108

## 2018-11-02 DIAGNOSIS — M6281 Muscle weakness (generalized): Secondary | ICD-10-CM | POA: Diagnosis not present

## 2018-11-02 DIAGNOSIS — I10 Essential (primary) hypertension: Secondary | ICD-10-CM | POA: Diagnosis not present

## 2018-11-02 DIAGNOSIS — Z79899 Other long term (current) drug therapy: Secondary | ICD-10-CM | POA: Diagnosis not present

## 2018-11-02 DIAGNOSIS — E039 Hypothyroidism, unspecified: Secondary | ICD-10-CM | POA: Insufficient documentation

## 2018-11-02 DIAGNOSIS — Z87891 Personal history of nicotine dependence: Secondary | ICD-10-CM | POA: Diagnosis not present

## 2018-11-02 DIAGNOSIS — R600 Localized edema: Secondary | ICD-10-CM | POA: Diagnosis not present

## 2018-11-02 DIAGNOSIS — R2689 Other abnormalities of gait and mobility: Secondary | ICD-10-CM

## 2018-11-02 DIAGNOSIS — M25561 Pain in right knee: Secondary | ICD-10-CM | POA: Diagnosis not present

## 2018-11-02 DIAGNOSIS — H538 Other visual disturbances: Secondary | ICD-10-CM | POA: Diagnosis not present

## 2018-11-02 DIAGNOSIS — Z96652 Presence of left artificial knee joint: Secondary | ICD-10-CM | POA: Insufficient documentation

## 2018-11-02 DIAGNOSIS — R072 Precordial pain: Secondary | ICD-10-CM | POA: Diagnosis not present

## 2018-11-02 DIAGNOSIS — R739 Hyperglycemia, unspecified: Secondary | ICD-10-CM

## 2018-11-02 DIAGNOSIS — R51 Headache: Secondary | ICD-10-CM | POA: Insufficient documentation

## 2018-11-02 DIAGNOSIS — E1165 Type 2 diabetes mellitus with hyperglycemia: Secondary | ICD-10-CM | POA: Insufficient documentation

## 2018-11-02 DIAGNOSIS — R079 Chest pain, unspecified: Secondary | ICD-10-CM | POA: Diagnosis not present

## 2018-11-02 DIAGNOSIS — Z7982 Long term (current) use of aspirin: Secondary | ICD-10-CM | POA: Diagnosis not present

## 2018-11-02 DIAGNOSIS — R000 Tachycardia, unspecified: Secondary | ICD-10-CM | POA: Diagnosis not present

## 2018-11-02 DIAGNOSIS — M5481 Occipital neuralgia: Secondary | ICD-10-CM | POA: Diagnosis not present

## 2018-11-02 DIAGNOSIS — R519 Headache, unspecified: Secondary | ICD-10-CM

## 2018-11-02 LAB — CBG MONITORING, ED: Glucose-Capillary: 548 mg/dL (ref 70–99)

## 2018-11-02 LAB — CBC
HCT: 44.1 % (ref 36.0–46.0)
Hemoglobin: 14.5 g/dL (ref 12.0–15.0)
MCH: 26.6 pg (ref 26.0–34.0)
MCHC: 32.9 g/dL (ref 30.0–36.0)
MCV: 80.8 fL (ref 80.0–100.0)
Platelets: 342 10*3/uL (ref 150–400)
RBC: 5.46 MIL/uL — ABNORMAL HIGH (ref 3.87–5.11)
RDW: 12.8 % (ref 11.5–15.5)
WBC: 10.3 10*3/uL (ref 4.0–10.5)
nRBC: 0 % (ref 0.0–0.2)

## 2018-11-02 LAB — BASIC METABOLIC PANEL
Anion gap: 14 (ref 5–15)
BUN: 14 mg/dL (ref 6–20)
CO2: 22 mmol/L (ref 22–32)
Calcium: 8.8 mg/dL — ABNORMAL LOW (ref 8.9–10.3)
Chloride: 95 mmol/L — ABNORMAL LOW (ref 98–111)
Creatinine, Ser: 1.12 mg/dL — ABNORMAL HIGH (ref 0.44–1.00)
GFR calc Af Amer: >60 mL/min (ref >60)
GFR calc non Af Amer: 58 mL/min — ABNORMAL LOW (ref >60)
Glucose, Bld: 550 mg/dL (ref 70–99)
Potassium: 3.9 mmol/L (ref 3.5–5.1)
Sodium: 131 mmol/L — ABNORMAL LOW (ref 135–145)

## 2018-11-02 LAB — TROPONIN I: Troponin I: <0.03 ng/mL (ref <0.03)

## 2018-11-02 LAB — I-STAT BETA HCG BLOOD, ED (MC, WL, AP ONLY): I-stat hCG, quantitative: <5 m[IU]/mL (ref <5)

## 2018-11-02 MED ORDER — SODIUM CHLORIDE 0.9% FLUSH: 3.0000 mL | Freq: Once | INTRAVENOUS | Status: DC

## 2018-11-02 MED ORDER — SODIUM CHLORIDE 0.9 % IV BOLUS
2000.0000 mL | Freq: Once | INTRAVENOUS | Status: AC
  Administered 2018-11-02: 2000 mL via INTRAVENOUS

## 2018-11-02 MED ORDER — KETOROLAC TROMETHAMINE 15 MG/ML IJ SOLN
15.0000 mg | Freq: Once | INTRAMUSCULAR | Status: AC
  Administered 2018-11-02: 15 mg via INTRAVENOUS
  Filled 2018-11-02: qty 1

## 2018-11-02 MED ORDER — PROCHLORPERAZINE EDISYLATE 10 MG/2ML IJ SOLN
10.0000 mg | Freq: Once | INTRAMUSCULAR | Status: AC
  Administered 2018-11-02: 10 mg via INTRAVENOUS
  Filled 2018-11-02: qty 2

## 2018-11-02 NOTE — ED Provider Notes (Signed)
Pt with hyperglycemia Plan for fluids, recheck CBG, likely d/c home   Ripley Fraise, MD 11/02/18 2310

## 2018-11-02 NOTE — ED Triage Notes (Addendum)
Reports June 5th she woke up with a "knot" at the base of her neck, throbbing headache, blurred vision and chest heaviness.  She called her PCP who tested for Covid which came back neg. Took abx for a sinus infection.  Called PCP again due to blurred vision/h/a and chest heaviness not resolving. PCP ordered chest xray which was normal.  Pt was then given steroid but still feels fatigued with blurred vision/headaches.  Sent here for hypertension 159/111. Reports extreme thirst today.

## 2018-11-02 NOTE — Therapy (Signed)
[place], [place], [address] Phone: [phone]   Fax:  [phone]  Physical Therapy Treatment  Patient Details  Name: Judith [name] MRN: [id number] Date of Birth: 11-Apr-1971 Referring Provider (PT): Dr Levan Hurst    Encounter Date: 11/02/2018  PT End of Session - 11/02/18 1608    Visit Number  17    Number of Visits  28    Date for PT Re-Evaluation  11/14/18    PT Start Time  1603    Activity Tolerance  Patient tolerated treatment well    Behavior During Therapy  [facility] for tasks assessed/performed       Past Medical History:  Diagnosis Date  . Asthma   . Bipolar 1 disorder ([place])   . Depression   . GERD (gastroesophageal reflux disease)   . Hyperlipemia   . Hypothyroid   . Migraines     Past Surgical History:  Procedure Laterality Date  . ABDOMINAL SURGERY    . CARPAL TUNNEL RELEASE Bilateral 1999  . CESAREAN SECTION    . CHOLECYSTECTOMY    . THYROIDECTOMY    . TONSILLECTOMY      Vitals:   11/02/18 1612  BP: (!) 159/111  Pulse: (!) 108    Subjective Assessment - 11/02/18 1612    Subjective  Pt tearful as she reports that she has been sick since 10/21/18 with upper respiratory virus; "I just feel so weak".   She has been out of work since then and feels her knee is less swollen/straighter.  She has been written out of work until 02/15/19.    Currently in Pain?  Yes    Pain Score  2     Pain Location  Knee    Pain Orientation  Right    Pain Descriptors / Indicators  Dull    Aggravating Factors   prolonged standing    Pain Relieving Factors  ice    Pain Score  5    Pain Location  Neck    Pain Orientation  Right;Left;Upper    Aggravating Factors   ?    Pain Relieving Factors  ?         [facility] PT Assessment - 11/02/18 0001      Assessment   Medical Diagnosis  Rt TKA    Referring Provider (PT)  Dr Levan Hurst     Onset Date/Surgical Date  06/22/18    Hand  Dominance  Right    Next MD Visit  to schedule     Prior Therapy  here for Lt TKA       Circumferential Edema   Circumferential - Right  Knee:40.2 Calf: 40.9 cm        [facility] Adult PT Treatment/Exercise - 11/02/18 0001      Knee/Hip Exercises: Stretches   Passive Hamstring Stretch  Right;30 seconds;5 reps   3 long sitting,opp leg off of table; 2 supine with strap   Gastroc Stretch  Both;30 seconds;4 reps   incline board      Knee/Hip Exercises: Aerobic   Tread Mill  1.8-2.3 mph x 5.5 s    Other Aerobic  laps around gym in between exercises to decrease stiffness, cues to increase Rt heel strike.       Knee/Hip Exercises: Standing   Terminal Knee Extension  Right;1 set;10 reps   10 sec holds, ball behind knee    Step Down  Left;1 set;10 reps;Hand Hold: 2;Step Height: 4"  focus on retro step up into TKE     Knee/Hip Exercises: Supine   Short Arc Quad Sets  Strengthening;Right;10 reps;2 sets   thigh resting on green noodle slow release from straight      Knee/Hip Exercises: Prone   Hamstring Curl  5 reps;3 sets   in between prone hang.    Prone Knee Hang  3 minutes   1 min reps   Prone Knee Hang Weights (lbs)  2      Modalities   Modalities  Cryotherapy      Cryotherapy   Number Minutes Cryotherapy  10 Minutes    Cryotherapy Location  Cervical    Type of Cryotherapy  Ice pack      Vasopneumatic   Number Minutes Vasopneumatic   10 minutes    Vasopnuematic Location   Knee   Rt    Vasopneumatic Pressure  Low    Vasopneumatic Temperature   34 deg                  PT Long Term Goals - 10/17/18 1502      PT LONG TERM GOAL #1   Title  Increase ROM Rt knee to 0 deg extension - 125 deg flexion 11/14/2018    Time  14    Period  Weeks    Status  Revised      PT LONG TERM GOAL #2   Title  5/5 strength Rt LE 11/14/2018    Time  14    Period  Weeks    Status  Revised      PT LONG TERM GOAL #3   Title  Patient tolerating 45-60 min of standing and or walking  at work with minimal difficulty 11/14/2018    Time  14    Period  Weeks    Status  Revised      PT LONG TERM GOAL #4   Title  Independent in HEP 11/14/2018    Time  14    Period  Weeks    Status  Revised      PT LONG TERM GOAL #5   Title  Improve FOTO to </= 43% limitation 11/14/2018    Time  14    Period  Weeks    Status  Revised            Plan - 11/02/18 1609    Clinical Impression Statement  Pt's edema is measurably less in Rt knee today.  Rt knee ext improved by 2 deg.  Pt tolerated all exercises well, without increase in pain.  Towards end of session, pt reported her vision has been blurry lately.  Vitals taken and BP elevated.  Pt reports she may go seek treatment after therapy session (urgent care).  Progressing gradually towards goals.    Rehab Potential  Good    PT Frequency  2x / week    PT Duration  4 weeks    PT Treatment/Interventions  Patient/family education;ADLs/Self Care Home Management;Cryotherapy;Electrical Stimulation;Iontophoresis 4mg /ml Dexamethasone;Moist Heat;Ultrasound;Dry needling;Manual techniques;Gait training;Stair training;Functional mobility training;Therapeutic activities;Therapeutic exercise;Balance training;Neuromuscular re-education;Taping    PT Next Visit Plan  continued focus on ROM and functional strengthening - focus on terminal knee extension    PT Home Exercise Plan  Access Code: [email or web address]    Consulted and Agree with Plan of Care  Patient       Patient will benefit from skilled therapeutic intervention in order to improve the following deficits and impairments:  Increased muscle spasms, Increased fascial  restricitons, Decreased mobility, Decreased range of motion, Abnormal gait, Decreased activity tolerance, Decreased endurance, Decreased strength, Pain  Visit Diagnosis: 1. Acute pain of right knee   2. Muscle weakness (generalized)   3. Localized edema   4. Other abnormalities of gait and mobility        Problem  List Patient Active Problem List   Diagnosis Date Noted  . Acute cystitis without hematuria 03/21/2018  . History of left knee replacement 02/10/2018  . Morbid obesity ([place]) 11/21/2017  . Primary osteoarthritis of left knee 09/22/2017  . Primary osteoarthritis of right knee 09/22/2017  . Attention deficit hyperactivity disorder (ADHD) 05/12/2016  . Episodic mood disorder ([place]) 05/12/2016  . Type 2 diabetes mellitus without complication, without long-term current use of insulin ([place]) 05/12/2016  . Gastroesophageal reflux disease without esophagitis 05/12/2016  . DYSPNEA 07/22/2010  . CHEST PAIN 07/22/2010  . Hypothyroidism 09/13/2007  . Hyperlipidemia 09/13/2007  . ANXIETY DEPRESSION 09/13/2007  . MIGRAINE HEADACHE 09/13/2007  . Essential hypertension 09/13/2007  . ASTHMA 09/13/2007  . CHOLELITHIASIS, HX OF 09/13/2007  . NEPHROLITHIASIS, HX OF 09/13/2007  . CARPAL TUNNEL RELEASE, HX OF 09/13/2007   Kerin Perna, PTA 11/02/18 4:52 PM  [place], [place], [address] Phone: [phone]   Fax:  [phone]  Name: Judith [name] MRN: [id number] Date of Birth: 04-15-1971

## 2018-11-02 NOTE — ED Provider Notes (Signed)
[facility] EMERGENCY DEPARTMENT Provider Note   CSN: [id number] Arrival date & time: 11/02/18  1940    History   Chief Complaint Chief Complaint  Patient presents with  . Chest Pain  . Blurred Vision  . Hypertension    HPI Judith [name] is a 48 y.o. female.     48 yo F with chief complaint of headache blurred vision cough congestion polyphagia polydipsia polyuria.  Going on for about 2 weeks.  Has seen her family doctor 4 times and that period.  She called her family who said it sounds like she has type 1 diabetes though the patient currently has a diagnosis of type 2 diabetes.  She is somewhat worried about them.  States that normally she gets migraines and tension headaches that usually are diffusely along the back of her head but this 1 is more left-sided than right.  Worse with laying her head on a pillow or touching it.  She has had some congestion and mild bilateral ear pain.  Sore throat.  Was started on antibiotics and then steroids for this.  The history is provided by the patient.  Chest Pain Associated symptoms: no dizziness, no fever, no headache, no nausea, no palpitations, no shortness of breath and no vomiting   Hypertension Associated symptoms include chest pain. Pertinent negatives include no headaches and no shortness of breath.  Illness Severity:  Moderate Onset quality:  Gradual Duration:  2 days Timing:  Constant Progression:  Worsening Chronicity:  New Associated symptoms: chest pain   Associated symptoms: no congestion, no fever, no headaches, no myalgias, no nausea, no rhinorrhea, no shortness of breath, no vomiting and no wheezing     Past Medical History:  Diagnosis Date  . Asthma   . Bipolar 1 disorder ([place])   . Depression   . GERD (gastroesophageal reflux disease)   . Hyperlipemia   . Hypothyroid   . Migraines     Patient Active Problem List   Diagnosis Date Noted  . Acute cystitis without hematuria 03/21/2018  .  History of left knee replacement 02/10/2018  . Morbid obesity ([place]) 11/21/2017  . Primary osteoarthritis of left knee 09/22/2017  . Primary osteoarthritis of right knee 09/22/2017  . Attention deficit hyperactivity disorder (ADHD) 05/12/2016  . Episodic mood disorder ([place]) 05/12/2016  . Type 2 diabetes mellitus without complication, without long-term current use of insulin ([place]) 05/12/2016  . Gastroesophageal reflux disease without esophagitis 05/12/2016  . DYSPNEA 07/22/2010  . CHEST PAIN 07/22/2010  . Hypothyroidism 09/13/2007  . Hyperlipidemia 09/13/2007  . ANXIETY DEPRESSION 09/13/2007  . MIGRAINE HEADACHE 09/13/2007  . Essential hypertension 09/13/2007  . ASTHMA 09/13/2007  . CHOLELITHIASIS, HX OF 09/13/2007  . NEPHROLITHIASIS, HX OF 09/13/2007  . CARPAL TUNNEL RELEASE, HX OF 09/13/2007    Past Surgical History:  Procedure Laterality Date  . ABDOMINAL SURGERY    . CARPAL TUNNEL RELEASE Bilateral 1999  . CESAREAN SECTION    . CHOLECYSTECTOMY    . THYROIDECTOMY    . TONSILLECTOMY       OB History    Gravida  1   Para  1   Term  1   Preterm      AB      Living        SAB      TAB      Ectopic      Multiple      Live Births  Home Medications    Prior to Admission medications   Medication Sig Start Date End Date Taking? Authorizing Provider  albuterol (PROVENTIL) (2.5 MG/3ML) 0.083% nebulizer solution Nebulize 1 vial three times daily 05/25/16   Terald Sleeper, PA-C  aspirin EC 81 MG tablet Take 162 mg by mouth daily.     [provider]  atorvastatin (LIPITOR) 10 MG tablet Take 1 tablet (10 mg total) by mouth daily. 11/02/17   Terald Sleeper, PA-C  azithromycin (ZITHROMAX Z-PAK) 250 MG tablet Take as directed 10/21/18   Terald Sleeper, PA-C  budesonide-formoterol [facility]) 160-4.5 MCG/ACT inhaler Inhale 2 puffs into the lungs 2 (two) times daily. 10/21/18   Terald Sleeper, PA-C  busPIRone (BUSPAR) 15 MG tablet Take 15 mg by mouth  3 (three) times daily.  07/08/16   [provider]  cetirizine (ZYRTEC) 10 MG tablet Take 1 tablet (10 mg total) by mouth daily. Patient taking differently: Take 20 mg by mouth 2 (two) times daily.  11/08/17   Terald Sleeper, PA-C  diclofenac sodium (VOLTAREN) 1 % GEL Apply 4 g topically 4 (four) times daily. 01/04/18   Terald Sleeper, PA-C  dicyclomine (BENTYL) 20 MG tablet Take 1 tablet (20 mg total) by mouth every 6 (six) hours. 01/04/18   Terald Sleeper, PA-C  doxepin (SINEQUAN) 25 MG capsule Take 25 mg by mouth at bedtime.  05/23/18   [provider]  fluconazole (DIFLUCAN) 150 MG tablet 1 po q week x 4 weeks 05/24/18   Terald Sleeper, PA-C  hydrOXYzine (ATARAX/VISTARIL) 25 MG tablet Take 1 tablet (25 mg total) by mouth daily. Patient taking differently: Take 25 mg by mouth 3 (three) times daily.  02/21/18   Hassell Done, Mary-Margaret, FNP  ibuprofen (ADVIL) 800 MG tablet TAKE 1 TABLET BY MOUTH EVERY 8 HOURS AS NEEDED 10/11/18   Terald Sleeper, PA-C  lamoTRIgine (LAMICTAL) 150 MG tablet Take 150 mg by mouth 2 (two) times daily.    [provider]  levothyroxine (SYNTHROID, LEVOTHROID) 125 MCG tablet TAKE ONE TAB BY MOUTH DAILY 07/05/18   Terald Sleeper, PA-C  Lisdexamfetamine Dimesylate (VYVANSE PO) Take 70 mg by mouth daily.     [provider]  lisinopril (PRINIVIL,ZESTRIL) 20 MG tablet Take 20 mg by mouth daily.    [provider]  methylPREDNISolone (MEDROL DOSEPAK) 4 MG TBPK tablet Take as directed 10/25/18   Ronnie Doss M, DO  nystatin (MYCOSTATIN) 100000 UNIT/ML suspension Take 5 mLs (500,000 Units total) by mouth 4 (four) times daily. 10/31/18   Terald Sleeper, PA-C  ondansetron (ZOFRAN ODT) 8 MG disintegrating tablet Take 1 tablet (8 mg total) by mouth every 8 (eight) hours as needed for nausea or vomiting. 05/25/18   Terald Sleeper, PA-C  pantoprazole (PROTONIX) 40 MG tablet TAKE ONE TAB BY MOUTH 2 TIMES DAILY 08/22/18   Dettinger, Fransisca Kaufmann, MD   pioglitazone (ACTOS) 15 MG tablet TAKE 1 TABLET BY MOUTH DAILY 06/15/18   Terald Sleeper, PA-C  sertraline (ZOLOFT) 100 MG tablet Take 200 mg by mouth daily.  10/15/16   [provider]  silver sulfADIAZINE (SILVADENE) 1 % cream Apply topically. 05/23/18   [provider]  SUMAtriptan Succinate 4 MG/0.5ML SOCT Inject 4 mg into the skin once. 07/16/16 10/21/18  Terald Sleeper, PA-C  traMADol (ULTRAM) 50 MG tablet Take 1 tablet (50 mg total) by mouth every 8 (eight) hours as needed for severe pain. 08/30/18   Particia Nearing  S, PA-C    Family History Family History  Problem Relation Age of Onset  . Mental illness Other   . Diabetes Other     Social History Social History   Tobacco Use  . Smoking status: Former Research scientist (life sciences)  . Smokeless tobacco: Never Used  Substance Use Topics  . Alcohol use: No  . Drug use: No     Allergies   Diphenhydramine, Morphine, Prednisone, Erythromycin, and Sulfonamide derivatives   Review of Systems Review of Systems  Constitutional: Negative for chills and fever.  HENT: Negative for congestion and rhinorrhea.   Eyes: Negative for redness and visual disturbance.  Respiratory: Negative for shortness of breath and wheezing.   Cardiovascular: Positive for chest pain. Negative for palpitations.  Gastrointestinal: Negative for nausea and vomiting.  Genitourinary: Negative for dysuria and urgency.  Musculoskeletal: Negative for arthralgias and myalgias.  Skin: Negative for pallor and wound.  Neurological: Negative for dizziness and headaches.     Physical Exam Updated Vital Signs BP (!) 148/108   Pulse (!) 55   Temp 97.9 F (36.6 C) (Axillary)   Resp 18   SpO2 100%   Physical Exam Vitals signs and nursing note reviewed.  Constitutional:      General: She is not in acute distress.    Appearance: She is well-developed. She is not diaphoretic.  HENT:     Head: Normocephalic and atraumatic.     Comments: Swollen turbinates, posterior nasal  drip, no noted sinus ttp, tm normal bilaterally.   Eyes:     Pupils: Pupils are equal, round, and reactive to light.  Neck:     Musculoskeletal: Normal range of motion and neck supple.  Cardiovascular:     Rate and Rhythm: Normal rate and regular rhythm.     Heart sounds: No murmur. No friction rub. No gallop.   Pulmonary:     Effort: Pulmonary effort is normal.     Breath sounds: No wheezing or rales.  Abdominal:     General: There is no distension.     Palpations: Abdomen is soft.     Tenderness: There is no abdominal tenderness.  Musculoskeletal:        General: No tenderness.  Skin:    General: Skin is warm and dry.  Neurological:     Mental Status: She is alert and oriented to person, place, and time.     Cranial Nerves: Cranial nerves are intact.     Sensory: Sensation is intact.     Motor: Motor function is intact.     Coordination: Coordination is intact.     Gait: Gait is intact.     Comments: Mild tenderness to the left side of the occiput. Benign neuro exam  Psychiatric:        Behavior: Behavior normal.      ED Treatments / Results  Labs (all labs ordered are listed, but only abnormal results are displayed) Labs Reviewed  BASIC METABOLIC PANEL - Abnormal; Notable for the following components:      Result Value   Sodium 131 (*)    Chloride 95 (*)    Glucose, Bld 550 (*)    Creatinine, Ser 1.12 (*)    Calcium 8.8 (*)    GFR calc non Af Amer 58 (*)    All other components within normal limits  CBC - Abnormal; Notable for the following components:   RBC 5.46 (*)    All other components within normal limits  CBG MONITORING, ED - Abnormal;  Notable for the following components:   Glucose-Capillary 548 (*)    All other components within normal limits  TROPONIN I  I-STAT BETA HCG BLOOD, ED ([facility], [facility], [facility] ONLY)    EKG EKG Interpretation  Date/Time:  Wednesday November 02 2018 20:06:36 EDT Ventricular Rate:  105 PR Interval:  156 QRS Duration: 84 QT Interval:   368 QTC Calculation: 486 R Axis:   66 Text Interpretation:  Sinus tachycardia Otherwise normal ECG Since last tracing rate faster Confirmed by Deno Etienne [phone]) on 11/02/2018 10:06:50 PM   Radiology Dg Chest 2 View  Result Date: 11/02/2018 CLINICAL DATA:  Chest pain EXAM: CHEST - 2 VIEW COMPARISON:  10/25/2018 FINDINGS: The heart size and mediastinal contours are within normal limits. Both lungs are clear. Mild degenerative changes of the spine. IMPRESSION: No active cardiopulmonary disease. Electronically Signed   By: Donavan Foil M.D.   On: 11/02/2018 20:50    Procedures Procedures (including critical care time)  Medications Ordered in ED Medications  sodium chloride flush (NS) 0.9 % injection 3 mL (has no administration in time range)  sodium chloride 0.9 % bolus 2,000 mL (2,000 mLs Intravenous New Bag/Given 11/02/18 2236)  prochlorperazine (COMPAZINE) injection 10 mg (10 mg Intravenous Given 11/02/18 2240)  ketorolac (TORADOL) 15 MG/ML injection 15 mg (15 mg Intravenous Given 11/02/18 2237)     Initial Impression / Assessment and Plan / ED Course  I have reviewed the triage vital signs and the nursing notes.  Pertinent labs & imaging results that were available during my care of the patient were reviewed by me and considered in my medical decision making (see chart for details).        48 yo F with a chief complaint of headache blurred vision polydipsia polyuria polyphagia and URI-like symptoms going on for the past 2 weeks.  Headache and blurry vision are a little bit more recent.  Patient has a benign neurologic exam.  Her blood sugar is 550 which I think likely explains most of her symptoms.  This may be caused by her steroid.  We will give 2 L of IV fluids.  Headache cocktail and reassess.  Signed out to Dr. Christy Gentles please see his note for further details of care.   The patients results and plan were reviewed and discussed.   Any x-rays performed were independently  reviewed by myself.   Differential diagnosis were considered with the presenting HPI.  Medications  sodium chloride flush (NS) 0.9 % injection 3 mL (has no administration in time range)  sodium chloride 0.9 % bolus 2,000 mL (2,000 mLs Intravenous New Bag/Given 11/02/18 2236)  prochlorperazine (COMPAZINE) injection 10 mg (10 mg Intravenous Given 11/02/18 2240)  ketorolac (TORADOL) 15 MG/ML injection 15 mg (15 mg Intravenous Given 11/02/18 2237)    Vitals:   11/02/18 1958  BP: (!) 148/108  Pulse: (!) 55  Resp: 18  Temp: 97.9 F (36.6 C)  TempSrc: Axillary  SpO2: 100%    Final diagnoses:  Hyperglycemia  Occipital headache    Admission/ observation were discussed with the admitting physician, patient and/or family and they are comfortable with the plan.    Final Clinical Impressions(s) / ED Diagnoses   Final diagnoses:  Hyperglycemia  Occipital headache    ED Discharge Orders    None       Deno Etienne, DO 11/02/18 2314

## 2018-11-03 ENCOUNTER — Emergency Department (HOSPITAL_COMMUNITY): Payer: BC Managed Care – PPO

## 2018-11-03 DIAGNOSIS — R51 Headache: Secondary | ICD-10-CM | POA: Diagnosis not present

## 2018-11-03 LAB — CBG MONITORING, ED
Glucose-Capillary: 376 mg/dL — ABNORMAL HIGH (ref 70–99)
Glucose-Capillary: 417 mg/dL — ABNORMAL HIGH (ref 70–99)

## 2018-11-03 MED ORDER — DIPHENHYDRAMINE HCL 50 MG/ML IJ SOLN
12.5000 mg | Freq: Once | INTRAMUSCULAR | Status: AC
  Administered 2018-11-03: 12.5 mg via INTRAVENOUS
  Filled 2018-11-03: qty 1

## 2018-11-03 MED ORDER — METOCLOPRAMIDE HCL 5 MG/ML IJ SOLN
10.0000 mg | Freq: Once | INTRAMUSCULAR | Status: AC
  Administered 2018-11-03: 10 mg via INTRAVENOUS
  Filled 2018-11-03: qty 2

## 2018-11-03 NOTE — Discharge Instructions (Addendum)

## 2018-11-03 NOTE — ED Provider Notes (Signed)
Patient improved.  CT head negative.  Glucose now less than 400.  Patient reports medication compliance.  I have instructed her to call her PCP later today to review her diabetic meds as her glucose was over 500 originally.   Ripley Fraise, MD 11/03/18 0300

## 2018-11-03 NOTE — ED Provider Notes (Signed)
Patient reporting persistent headache, no focal neuro deficits.  Will treat headache with Reglan and Benadryl, CT head and if negative will be discharged home.  Glucose is improving She reports she can take small doses of Benadryl.   Ripley Fraise, MD 11/03/18 [phone]

## 2018-11-04 ENCOUNTER — Encounter: Payer: Self-pay | Admitting: Physical Therapy

## 2018-11-07 DIAGNOSIS — H10013 Acute follicular conjunctivitis, bilateral: Secondary | ICD-10-CM | POA: Diagnosis not present

## 2018-11-09 ENCOUNTER — Other Ambulatory Visit: Payer: Self-pay

## 2018-11-09 ENCOUNTER — Ambulatory Visit (INDEPENDENT_AMBULATORY_CARE_PROVIDER_SITE_OTHER): Payer: BC Managed Care – PPO | Admitting: Physical Therapy

## 2018-11-09 ENCOUNTER — Encounter: Payer: Self-pay | Admitting: Physical Therapy

## 2018-11-09 DIAGNOSIS — M6281 Muscle weakness (generalized): Secondary | ICD-10-CM | POA: Diagnosis not present

## 2018-11-09 DIAGNOSIS — R600 Localized edema: Secondary | ICD-10-CM | POA: Diagnosis not present

## 2018-11-09 DIAGNOSIS — R2689 Other abnormalities of gait and mobility: Secondary | ICD-10-CM

## 2018-11-09 DIAGNOSIS — M25561 Pain in right knee: Secondary | ICD-10-CM | POA: Diagnosis not present

## 2018-11-09 NOTE — Therapy (Signed)
[place], [place], [address] Phone: [phone]   Fax:  [phone]  Physical Therapy Treatment  Patient Details  Name: Judith [name] MRN: [id number] Date of Birth: 04-15-1971 Referring Provider (PT): Dr Levan Hurst    Encounter Date: 11/09/2018  PT End of Session - 11/09/18 1411    Visit Number  17    Number of Visits  28    Date for PT Re-Evaluation  11/14/18    PT Start Time  6203    PT Stop Time  1445    PT Time Calculation (min)  40 min    Activity Tolerance  Patient tolerated treatment well    Behavior During Therapy  [facility] for tasks assessed/performed       Past Medical History:  Diagnosis Date  . Asthma   . Bipolar 1 disorder ([place])   . Depression   . GERD (gastroesophageal reflux disease)   . Hyperlipemia   . Hypothyroid   . Migraines     Past Surgical History:  Procedure Laterality Date  . ABDOMINAL SURGERY    . CARPAL TUNNEL RELEASE Bilateral 1999  . CESAREAN SECTION    . CHOLECYSTECTOMY    . THYROIDECTOMY    . TONSILLECTOMY      There were no vitals filed for this visit.  Subjective Assessment - 11/09/18 1411    Subjective  Pt reports she now has an ulcer on her Lt cornea; she has been using drops on eye for this.  (Observed wearing folded paper towel over Lt eye).  She reports she has less swelling in her Rt knee today. Rt knee hasn't hurt since 4 days when it was raining.    Currently in Pain?  Yes    Pain Score  5     Pain Location  Eye    Pain Orientation  Left    Pain Descriptors / Indicators  Throbbing;Burning    Aggravating Factors   bright light    Pain Relieving Factors  rest, cold rag, meds         [facility] PT Assessment - 11/09/18 0001      Assessment   Medical Diagnosis  Rt TKA    Referring Provider (PT)  Dr Levan Hurst     Onset Date/Surgical Date  06/22/18    Hand Dominance  Right    Next MD Visit  to schedule     Prior Therapy  here  for Lt TKA       AROM   Right Knee Extension  -6       [facility] Adult PT Treatment/Exercise - 11/09/18 0001      Knee/Hip Exercises: Stretches   Passive Hamstring Stretch  Right;30 seconds;3 reps   3 long sitting,opp leg off of table, overpressure from pt.    ITB Stretch  Right;30 seconds;3 reps   supine with strap    Gastroc Stretch  Both;30 seconds;4 reps   incline board      Knee/Hip Exercises: Aerobic   Tread Mill  2.0 mph x 3 min     Recumbent Bike  L1: 3 min     Other Aerobic  laps around gym in between exercises to decrease stiffness, cues to increase Rt heel strike.       Knee/Hip Exercises: Standing   Terminal Knee Extension  Right;1 set;10 reps   10 sec holds, ball behind knee      Knee/Hip Exercises: Prone   Hamstring Curl  5 reps;3 sets   in between prone hang.    Prone Knee Hang  3 minutes   1 min reps   Prone Knee Hang Weights (lbs)  3    Other Prone Exercises  quad set prone straightening knee with toe tucked 10 sec hold x 10       Modalities   Modalities  Ultrasound      Ultrasound   Ultrasound Location  Rt prox calf and mid-distal hamstring     Ultrasound Parameters  100%, 1.3w/cm 2 x 8 min     Ultrasound Goals  --   tightness     Manual Therapy   Soft tissue mobilization  STM Rt hamstring and prox calf.     Passive ROM  overpressure into Rt knee ext x 20 sec x 5 reps    RLE on therapist shoulder with hamstring stretch                 PT Long Term Goals - 10/17/18 1502      PT LONG TERM GOAL #1   Title  Increase ROM Rt knee to 0 deg extension - 125 deg flexion 11/14/2018    Time  14    Period  Weeks    Status  Revised      PT LONG TERM GOAL #2   Title  5/5 strength Rt LE 11/14/2018    Time  14    Period  Weeks    Status  Revised      PT LONG TERM GOAL #3   Title  Patient tolerating 45-60 min of standing and or walking at work with minimal difficulty 11/14/2018    Time  14    Period  Weeks    Status  Revised      PT LONG TERM  GOAL #4   Title  Independent in HEP 11/14/2018    Time  14    Period  Weeks    Status  Revised      PT LONG TERM GOAL #5   Title  Improve FOTO to </= 43% limitation 11/14/2018    Time  14    Period  Weeks    Status  Revised            Plan - 11/09/18 1412    Clinical Impression Statement  Rt knee visually less swollen than last week.  Rt knee extension gradually improving.  Less palpable tightness in Rt hamstrings than 2 sessions ago.  Pt tolerated all exercises well.  Goals are ongoing.    Rehab Potential  Good    PT Frequency  2x / week    PT Duration  4 weeks    PT Treatment/Interventions  Patient/family education;ADLs/Self Care Home Management;Cryotherapy;Electrical Stimulation;Iontophoresis 4mg /ml Dexamethasone;Moist Heat;Ultrasound;Dry needling;Manual techniques;Gait training;Stair training;Functional mobility training;Therapeutic activities;Therapeutic exercise;Balance training;Neuromuscular re-education;Taping    PT Next Visit Plan  continued focus on ROM and functional strengthening - focus on terminal knee extension    PT Home Exercise Plan  Access Code: [email or web address]    Consulted and Agree with Plan of Care  Patient       Patient will benefit from skilled therapeutic intervention in order to improve the following deficits and impairments:  Increased muscle spasms, Increased fascial restricitons, Decreased mobility, Decreased range of motion, Abnormal gait, Decreased activity tolerance, Decreased endurance, Decreased strength, Pain  Visit Diagnosis: 1. Acute pain of right knee   2. Muscle weakness (generalized)   3. Localized edema   4. Other abnormalities  of gait and mobility        Problem List Patient Active Problem List   Diagnosis Date Noted  . Acute cystitis without hematuria 03/21/2018  . History of left knee replacement 02/10/2018  . Morbid obesity ([place]) 11/21/2017  . Primary osteoarthritis of left knee 09/22/2017  . Primary osteoarthritis of  right knee 09/22/2017  . Attention deficit hyperactivity disorder (ADHD) 05/12/2016  . Episodic mood disorder ([place]) 05/12/2016  . Type 2 diabetes mellitus without complication, without long-term current use of insulin ([place]) 05/12/2016  . Gastroesophageal reflux disease without esophagitis 05/12/2016  . DYSPNEA 07/22/2010  . CHEST PAIN 07/22/2010  . Hypothyroidism 09/13/2007  . Hyperlipidemia 09/13/2007  . ANXIETY DEPRESSION 09/13/2007  . MIGRAINE HEADACHE 09/13/2007  . Essential hypertension 09/13/2007  . ASTHMA 09/13/2007  . CHOLELITHIASIS, HX OF 09/13/2007  . NEPHROLITHIASIS, HX OF 09/13/2007  . CARPAL TUNNEL RELEASE, HX OF 09/13/2007   Kerin Perna, PTA 11/09/18 2:56 PM  [place], [place], [address] Phone: [phone]   Fax:  [phone]  Name: Judith [name] MRN: [id number] Date of Birth: 1970/08/04

## 2018-11-11 ENCOUNTER — Encounter: Payer: BC Managed Care – PPO | Admitting: Rehabilitative and Restorative Service Providers"

## 2018-11-12 DIAGNOSIS — H10013 Acute follicular conjunctivitis, bilateral: Secondary | ICD-10-CM | POA: Diagnosis not present

## 2018-11-15 ENCOUNTER — Ambulatory Visit (INDEPENDENT_AMBULATORY_CARE_PROVIDER_SITE_OTHER): Payer: BC Managed Care – PPO | Admitting: Rehabilitative and Restorative Service Providers"

## 2018-11-15 ENCOUNTER — Other Ambulatory Visit: Payer: Self-pay | Admitting: Physician Assistant

## 2018-11-15 ENCOUNTER — Other Ambulatory Visit: Payer: Self-pay

## 2018-11-15 ENCOUNTER — Encounter: Payer: Self-pay | Admitting: Rehabilitative and Restorative Service Providers"

## 2018-11-15 DIAGNOSIS — M25561 Pain in right knee: Secondary | ICD-10-CM

## 2018-11-15 DIAGNOSIS — M25562 Pain in left knee: Secondary | ICD-10-CM

## 2018-11-15 DIAGNOSIS — R2689 Other abnormalities of gait and mobility: Secondary | ICD-10-CM

## 2018-11-15 DIAGNOSIS — M6281 Muscle weakness (generalized): Secondary | ICD-10-CM | POA: Diagnosis not present

## 2018-11-15 DIAGNOSIS — R600 Localized edema: Secondary | ICD-10-CM | POA: Diagnosis not present

## 2018-11-15 NOTE — Therapy (Signed)
[place], [place], [address] Phone: [phone]   Fax:  [phone]  Physical Therapy Treatment  Patient Details  Name: Judith [name] MRN: [id number] Date of Birth: January 09, 1971 Referring Provider (PT): Dr Levan Hurst    Encounter Date: 11/15/2018  PT End of Session - 11/15/18 1104    Visit Number  18    Number of Visits  28    Date for PT Re-Evaluation  12/26/18    PT Start Time  1100    PT Stop Time  1154    PT Time Calculation (min)  54 min       Past Medical History:  Diagnosis Date  . Asthma   . Bipolar 1 disorder ([place])   . Depression   . GERD (gastroesophageal reflux disease)   . Hyperlipemia   . Hypothyroid   . Migraines     Past Surgical History:  Procedure Laterality Date  . ABDOMINAL SURGERY    . CARPAL TUNNEL RELEASE Bilateral 1999  . CESAREAN SECTION    . CHOLECYSTECTOMY    . THYROIDECTOMY    . TONSILLECTOMY      There were no vitals filed for this visit.  Subjective Assessment - 11/15/18 1111    Subjective  Patient reports that the swelling in her knee is much better and the ROM is increased since she has been out of work. She will remain out of work until September to keep working on the knee rehab.    Currently in Pain?  Yes    Pain Score  1     Pain Location  Knee    Pain Orientation  Right    Pain Descriptors / Indicators  Aching;Throbbing;Burning    Pain Type  Chronic pain;Surgical pain    Pain Onset  More than a month ago    Pain Frequency  Intermittent    Aggravating Factors   work; rainy cold weather    Pain Relieving Factors  rest; cold; meds         [facility] PT Assessment - 11/15/18 0001      Assessment   Medical Diagnosis  Rt TKA    Referring Provider (PT)  Dr Levan Hurst     Onset Date/Surgical Date  06/22/18    Hand Dominance  Right    Next MD Visit  to schedule     Prior Therapy  here for Lt TKA       AROM   Right Knee Extension   -5    Right Knee Flexion  122    Left Knee Extension  0    Left Knee Flexion  128    Right/Left Ankle  --   WFl's      PROM   Right Knee Extension  -4    Right Knee Flexion  125      Strength   Right Hip Flexion  5/5    Right Hip Extension  5/5    Right Hip ABduction  5/5    Right Hip ADduction  5/5    Left Hip Flexion  5/5    Left Hip Extension  5/5    Left Hip ABduction  5/5    Left Hip ADduction  5/5    Right Knee Flexion  5/5    Right Knee Extension  4+/5   difficulty with TKE    Left Knee Flexion  5/5    Left Knee Extension  5/5  Flexibility   Hamstrings  tight ~ 75-80 deg bilat     Quadriceps  Rt tightness noted ~ 105     ITB  mild tightness     Piriformis  mild tightness                    [facility] Adult PT Treatment/Exercise - 11/15/18 0001      Knee/Hip Exercises: Stretches   Passive Hamstring Stretch  Right;30 seconds;3 reps   3 long sitting,opp leg off of table, overpressure from pt.    ITB Stretch  Right;30 seconds;3 reps   supine with strap    Gastroc Stretch  Both;30 seconds;4 reps   incline board    Other Knee/Hip Stretches  supine,feet on green swiss ball, rolling ball in/out for ROM of knees x 10 (5 sec pause in each direction )      Knee/Hip Exercises: Aerobic   Tread Mill  2.0 mph x 4 min     Recumbent Bike  L1: 5 min     Other Aerobic  laps around gym in between exercises to decrease stiffness, cues to increase Rt heel strike.       Knee/Hip Exercises: Standing   Terminal Knee Extension  Right;1 set;10 reps   10 sec holds, ball behind knee    Terminal Knee Extension Limitations  resisted TKE with black TB PT holding band to provide resistance       Knee/Hip Exercises: Supine   Quad Sets  AROM;Strengthening;Right;10 reps   heel supported on yoga block    Short Arc Quad Sets  Strengthening;Right;10 reps;2 sets   10 sec hold slow eccentric lowering    Straight Leg Raises  AROM;Strengthening;Right;10 reps   quad sest to focus  on TKE then lift    Other Supine Knee/Hip Exercises  LE/trunk rotation side to side with ball; hip knee flex/ext with ball  ~ 1-2 min each       Manual Therapy   Passive ROM  overpressure into Rt knee ext x 20 sec x 5 reps    heel on yoga block             PT Education - 11/15/18 1156    Education Details  HEP - focus on TKE    Person(s) Educated  Patient    Methods  Explanation;Demonstration;Tactile cues;Verbal cues    Comprehension  Verbalized understanding;Returned demonstration;Verbal cues required;Tactile cues required          PT Long Term Goals - 11/15/18 1105      PT LONG TERM GOAL #1   Title  Increase ROM Rt knee to 0 deg extension - 125 deg flexion 12/26/2018    Time  20    Period  Weeks    Status  Revised      PT LONG TERM GOAL #2   Title  5/5 strength Rt LE 12/26/2018    Time  20    Period  Weeks    Status  Revised      PT LONG TERM GOAL #3   Title  Patient tolerating 45-60 min of standing and or walking at work with minimal difficulty 12/26/2018    Time  20    Period  Weeks    Status  Revised      PT LONG TERM GOAL #4   Title  Independent in HEP 11/25/2018    Time  20    Period  Weeks    Status  Revised  PT LONG TERM GOAL #5   Title  Improve FOTO to </= 43% limitation 12/26/2018    Time  20    Period  Weeks    Status  Revised            Plan - 11/15/18 1108    Clinical Impression Statement  Patient is now out of work again until September in order to allow knee to heal - decrease swelling and improve strength/stamina for work activities and ADL's. She has continued limited knee extension and LE weakness. Will benefit from PT to achieve maximum rehab potential.    Rehab Potential  Good    PT Frequency  2x / week    PT Duration  4 weeks    PT Treatment/Interventions  Patient/family education;ADLs/Self Care Home Management;Cryotherapy;Electrical Stimulation;Iontophoresis 4mg /ml Dexamethasone;Moist Heat;Ultrasound;Dry needling;Manual  techniques;Gait training;Stair training;Functional mobility training;Therapeutic activities;Therapeutic exercise;Balance training;Neuromuscular re-education;Taping    PT Next Visit Plan  continued focus on ROM and functional strengthening - focus on terminal knee extension    PT Home Exercise Plan  Access Code: [email or web address]    Consulted and Agree with Plan of Care  Patient       Patient will benefit from skilled therapeutic intervention in order to improve the following deficits and impairments:  Increased muscle spasms, Increased fascial restricitons, Decreased mobility, Decreased range of motion, Abnormal gait, Decreased activity tolerance, Decreased endurance, Decreased strength, Pain  Visit Diagnosis: 1. Acute pain of right knee   2. Muscle weakness (generalized)   3. Localized edema   4. Other abnormalities of gait and mobility   5. Acute pain of left knee        Problem List Patient Active Problem List   Diagnosis Date Noted  . Acute cystitis without hematuria 03/21/2018  . History of left knee replacement 02/10/2018  . Morbid obesity ([place]) 11/21/2017  . Primary osteoarthritis of left knee 09/22/2017  . Primary osteoarthritis of right knee 09/22/2017  . Attention deficit hyperactivity disorder (ADHD) 05/12/2016  . Episodic mood disorder ([place]) 05/12/2016  . Type 2 diabetes mellitus without complication, without long-term current use of insulin ([place]) 05/12/2016  . Gastroesophageal reflux disease without esophagitis 05/12/2016  . DYSPNEA 07/22/2010  . CHEST PAIN 07/22/2010  . Hypothyroidism 09/13/2007  . Hyperlipidemia 09/13/2007  . ANXIETY DEPRESSION 09/13/2007  . MIGRAINE HEADACHE 09/13/2007  . Essential hypertension 09/13/2007  . ASTHMA 09/13/2007  . CHOLELITHIASIS, HX OF 09/13/2007  . NEPHROLITHIASIS, HX OF 09/13/2007  . CARPAL TUNNEL RELEASE, HX OF 09/13/2007    Sherill Mangen Nilda Simmer PT, MPH  11/15/2018, 12:01 PM  [facility] [place], [place], [address] Phone: [phone]   Fax:  [phone]  Name: Judith [name] MRN: [id number] Date of Birth: 09/04/1970

## 2018-11-16 ENCOUNTER — Ambulatory Visit (INDEPENDENT_AMBULATORY_CARE_PROVIDER_SITE_OTHER): Payer: Medicaid Other | Admitting: Physician Assistant

## 2018-11-16 ENCOUNTER — Encounter: Payer: Self-pay | Admitting: Physician Assistant

## 2018-11-16 VITALS — BP 143/88 | HR 94 | Temp 99.8°F | Ht 68.0 in | Wt 250.4 lb

## 2018-11-16 DIAGNOSIS — Z01419 Encounter for gynecological examination (general) (routine) without abnormal findings: Secondary | ICD-10-CM

## 2018-11-16 DIAGNOSIS — W57XXXA Bitten or stung by nonvenomous insect and other nonvenomous arthropods, initial encounter: Secondary | ICD-10-CM

## 2018-11-16 DIAGNOSIS — F39 Unspecified mood [affective] disorder: Secondary | ICD-10-CM

## 2018-11-16 DIAGNOSIS — Z96651 Presence of right artificial knee joint: Secondary | ICD-10-CM

## 2018-11-16 DIAGNOSIS — Z96652 Presence of left artificial knee joint: Secondary | ICD-10-CM

## 2018-11-16 DIAGNOSIS — E039 Hypothyroidism, unspecified: Secondary | ICD-10-CM

## 2018-11-16 DIAGNOSIS — Z0000 Encounter for general adult medical examination without abnormal findings: Secondary | ICD-10-CM

## 2018-11-16 DIAGNOSIS — I10 Essential (primary) hypertension: Secondary | ICD-10-CM

## 2018-11-16 DIAGNOSIS — F988 Other specified behavioral and emotional disorders with onset usually occurring in childhood and adolescence: Secondary | ICD-10-CM

## 2018-11-16 DIAGNOSIS — E119 Type 2 diabetes mellitus without complications: Secondary | ICD-10-CM

## 2018-11-16 LAB — BAYER DCA HB A1C WAIVED: HB A1C (BAYER DCA - WAIVED): >14 % — ABNORMAL HIGH (ref <7.0)

## 2018-11-16 MED ORDER — PIOGLITAZONE HCL 15 MG PO TABS: 15.0000 mg | ORAL_TABLET | Freq: Every day | ORAL | 3 refills | Status: DC

## 2018-11-16 MED ORDER — DICLOFENAC SODIUM 1 % TD GEL: 4.0000 g | Freq: Four times a day (QID) | TRANSDERMAL | 2 refills | Status: DC

## 2018-11-16 MED ORDER — LISDEXAMFETAMINE DIMESYLATE 50 MG PO CAPS: 50.0000 mg | ORAL_CAPSULE | Freq: Every day | ORAL | 0 refills | Status: DC

## 2018-11-16 MED ORDER — LISINOPRIL 20 MG PO TABS: 20.0000 mg | ORAL_TABLET | Freq: Every day | ORAL | 3 refills | Status: DC

## 2018-11-16 MED ORDER — PANTOPRAZOLE SODIUM 40 MG PO TBEC: DELAYED_RELEASE_TABLET | ORAL | 3 refills | Status: DC

## 2018-11-16 MED ORDER — CETIRIZINE HCL 10 MG PO TABS: 10.0000 mg | ORAL_TABLET | Freq: Every day | ORAL | 11 refills | Status: DC

## 2018-11-16 MED ORDER — SERTRALINE HCL 100 MG PO TABS: 200.0000 mg | ORAL_TABLET | Freq: Every day | ORAL | 3 refills | Status: DC

## 2018-11-16 MED ORDER — LAMOTRIGINE 150 MG PO TABS: 150.0000 mg | ORAL_TABLET | Freq: Two times a day (BID) | ORAL | 5 refills | Status: DC

## 2018-11-16 MED ORDER — HYDROXYZINE HCL 25 MG PO TABS: 25.0000 mg | ORAL_TABLET | Freq: Three times a day (TID) | ORAL | 5 refills | Status: DC

## 2018-11-16 MED ORDER — ATORVASTATIN CALCIUM 10 MG PO TABS: 10.0000 mg | ORAL_TABLET | Freq: Every day | ORAL | 3 refills | Status: DC

## 2018-11-17 ENCOUNTER — Other Ambulatory Visit: Payer: Self-pay

## 2018-11-17 ENCOUNTER — Ambulatory Visit (INDEPENDENT_AMBULATORY_CARE_PROVIDER_SITE_OTHER): Payer: BC Managed Care – PPO | Admitting: Rehabilitative and Restorative Service Providers"

## 2018-11-17 ENCOUNTER — Encounter: Payer: Self-pay | Admitting: Rehabilitative and Restorative Service Providers"

## 2018-11-17 DIAGNOSIS — M6281 Muscle weakness (generalized): Secondary | ICD-10-CM | POA: Diagnosis not present

## 2018-11-17 DIAGNOSIS — R2689 Other abnormalities of gait and mobility: Secondary | ICD-10-CM

## 2018-11-17 DIAGNOSIS — R600 Localized edema: Secondary | ICD-10-CM

## 2018-11-17 DIAGNOSIS — M25561 Pain in right knee: Secondary | ICD-10-CM | POA: Diagnosis not present

## 2018-11-17 LAB — CBC WITH DIFFERENTIAL/PLATELET
Basophils Absolute: 0.1 10*3/uL (ref 0.0–0.2)
Basos: 1 %
EOS (ABSOLUTE): 0.1 10*3/uL (ref 0.0–0.4)
Eos: 2 %
Hematocrit: 45.2 % (ref 34.0–46.6)
Hemoglobin: 14.8 g/dL (ref 11.1–15.9)
Immature Grans (Abs): 0 10*3/uL (ref 0.0–0.1)
Immature Granulocytes: 0 %
Lymphocytes Absolute: 2.6 10*3/uL (ref 0.7–3.1)
Lymphs: 33 %
MCH: 26.1 pg — ABNORMAL LOW (ref 26.6–33.0)
MCHC: 32.7 g/dL (ref 31.5–35.7)
MCV: 80 fL (ref 79–97)
Monocytes Absolute: 0.5 10*3/uL (ref 0.1–0.9)
Monocytes: 7 %
Neutrophils Absolute: 4.5 10*3/uL (ref 1.4–7.0)
Neutrophils: 57 %
Platelets: 370 10*3/uL (ref 150–450)
RBC: 5.66 x10E6/uL — ABNORMAL HIGH (ref 3.77–5.28)
RDW: 13 % (ref 11.7–15.4)
WBC: 7.9 10*3/uL (ref 3.4–10.8)

## 2018-11-17 LAB — CMP14+EGFR
ALT: 12 IU/L (ref 0–32)
AST: 13 IU/L (ref 0–40)
Albumin/Globulin Ratio: 1.4 (ref 1.2–2.2)
Albumin: 4.4 g/dL (ref 3.8–4.8)
Alkaline Phosphatase: 129 IU/L — ABNORMAL HIGH (ref 39–117)
BUN/Creatinine Ratio: 16 (ref 9–23)
BUN: 15 mg/dL (ref 6–24)
Bilirubin Total: 0.3 mg/dL (ref 0.0–1.2)
CO2: 21 mmol/L (ref 20–29)
Calcium: 9.3 mg/dL (ref 8.7–10.2)
Chloride: 97 mmol/L (ref 96–106)
Creatinine, Ser: 0.93 mg/dL (ref 0.57–1.00)
GFR calc Af Amer: 84 mL/min/{1.73_m2} (ref >59)
GFR calc non Af Amer: 73 mL/min/{1.73_m2} (ref >59)
Globulin, Total: 3.2 g/dL (ref 1.5–4.5)
Glucose: 452 mg/dL (ref 65–99)
Potassium: 4.5 mmol/L (ref 3.5–5.2)
Sodium: 137 mmol/L (ref 134–144)
Total Protein: 7.6 g/dL (ref 6.0–8.5)

## 2018-11-17 LAB — MICROALBUMIN / CREATININE URINE RATIO
Creatinine, Urine: 59.7 mg/dL
Microalb/Creat Ratio: 28 mg/g creat (ref 0–29)
Microalbumin, Urine: 16.8 ug/mL

## 2018-11-17 NOTE — Therapy (Signed)
[place], [place], [address] Phone: [phone]   Fax:  [phone]  Physical Therapy Treatment  Patient Details  Name: Judith [name] MRN: [id number] Date of Birth: 1971/05/07 Referring Provider (PT): Dr Levan Hurst    Encounter Date: 11/17/2018  PT End of Session - 11/17/18 1126    Visit Number  19    Number of Visits  28    Date for PT Re-Evaluation  12/26/18    PT Start Time  1118    PT Stop Time  1206    PT Time Calculation (min)  48 min    Activity Tolerance  Patient tolerated treatment well       Past Medical History:  Diagnosis Date  . Asthma   . Bipolar 1 disorder ([place])   . Depression   . GERD (gastroesophageal reflux disease)   . Hyperlipemia   . Hypothyroid   . Migraines     Past Surgical History:  Procedure Laterality Date  . ABDOMINAL SURGERY    . CARPAL TUNNEL RELEASE Bilateral 1999  . CESAREAN SECTION    . CHOLECYSTECTOMY    . THYROIDECTOMY    . TONSILLECTOMY      There were no vitals filed for this visit.      [facility] PT Assessment - 11/17/18 0001      Assessment   Medical Diagnosis  Rt TKA    Referring Provider (PT)  Dr Levan Hurst     Onset Date/Surgical Date  06/22/18    Hand Dominance  Right    Next MD Visit  to schedule     Prior Therapy  here for Lt TKA       Observation/Other Assessments   Focus on Therapeutic Outcomes (FOTO)   34% limitation       AROM   Right Knee Extension  -5    Right Knee Flexion  122    Left Knee Extension  0    Left Knee Flexion  128      PROM   Right Knee Extension  -4    Right Knee Flexion  125      Strength   Right Hip Flexion  5/5    Right Hip Extension  5/5    Right Hip ABduction  5/5    Right Hip ADduction  5/5    Left Hip Flexion  5/5    Left Hip Extension  5/5    Left Hip ABduction  5/5    Left Hip ADduction  5/5    Right Knee Flexion  5/5    Left Knee Flexion  5/5    Left Knee Extension   5/5      Flexibility   Hamstrings  tight ~ 75-80 deg bilat     Quadriceps  Rt tightness noted ~ 105     ITB  mild tightness     Piriformis  mild tightness                    [facility] Adult PT Treatment/Exercise - 11/17/18 0001      Knee/Hip Exercises: Stretches   Passive Hamstring Stretch  Right;30 seconds;3 reps   3 long sitting,opp leg off of table, overpressure from pt.    Passive Hamstring Stretch Limitations  seated HS stretch 30 sec x 3    fwd lean with quad set x ~ 5-7 reps    Gastroc Stretch  Both;30 seconds;4 reps   incline  board    Other Knee/Hip Stretches  supine,feet on green swiss ball, rolling ball in/out for ROM of knees x 10 (5 sec pause in each direction )      Knee/Hip Exercises: Aerobic   Tread Mill  2.2 mph x 5 min     Recumbent Bike  L3 x 2 min; L2 x 3 min     Other Aerobic  laps around gym in between exercises to decrease stiffness, cues to increase Rt heel strike.       Knee/Hip Exercises: Standing   Lateral Step Up  Right;10 reps;Hand Hold: 1;Hand Hold: 2;Step Height: 4"   touching heel to floor without shifting weight    Step Down  Right;15 reps;Step Height: 6";Hand Hold: 0;Hand Hold: 1   stepping up backward with Rt; down with Lt    SLS  30 sec x 1 reps each LE repeated on blue cushion 30 sec x 4 reps each LE     SLS with Vectors  SLS Rt with reach forward to touch chair x 10     Other Standing Knee Exercises  pushing sled +50# x 20 ft fwd/back x 3 reps       Knee/Hip Exercises: Supine   Quad Sets  AROM;Strengthening;Right;10 reps   heel supported on yoga block 30 sec hold   Short Arc Quad Sets  Strengthening;Right;10 reps;2 sets   10 sec hold slow eccentric lowering    Straight Leg Raises  AROM;Strengthening;Right;10 reps   quad sest to focus on TKE then lift    Other Supine Knee/Hip Exercises  LE/trunk rotation side to side with ball; hip knee flex/ext with ball  ~ 1-2 min each                   PT Long Term Goals - 11/15/18  1105      PT LONG TERM GOAL #1   Title  Increase ROM Rt knee to 0 deg extension - 125 deg flexion 12/26/2018    Time  20    Period  Weeks    Status  Revised      PT LONG TERM GOAL #2   Title  5/5 strength Rt LE 12/26/2018    Time  20    Period  Weeks    Status  Revised      PT LONG TERM GOAL #3   Title  Patient tolerating 45-60 min of standing and or walking at work with minimal difficulty 12/26/2018    Time  20    Period  Weeks    Status  Revised      PT LONG TERM GOAL #4   Title  Independent in HEP 11/25/2018    Time  20    Period  Weeks    Status  Revised      PT LONG TERM GOAL #5   Title  Improve FOTO to </= 43% limitation 12/26/2018    Time  20    Period  Weeks    Status  Revised            Plan - 11/17/18 1127    Clinical Impression Statement  Continued work on terminal knee extension; functional strengthening; AAROM into knee extension. Progressing again now that edema is decreased now that she is out of work.    Rehab Potential  Good    PT Frequency  2x / week    PT Duration  4 weeks    PT Treatment/Interventions  Patient/family education;ADLs/Self Care Home  Management;Cryotherapy;Electrical Stimulation;Iontophoresis 4mg /ml Dexamethasone;Moist Heat;Ultrasound;Dry needling;Manual techniques;Gait training;Stair training;Functional mobility training;Therapeutic activities;Therapeutic exercise;Balance training;Neuromuscular re-education;Taping    PT Next Visit Plan  continued focus on ROM and functional strengthening - focus on terminal knee extension    PT Home Exercise Plan  Access Code: [email or web address]    Consulted and Agree with Plan of Care  Patient       Patient will benefit from skilled therapeutic intervention in order to improve the following deficits and impairments:  Increased muscle spasms, Increased fascial restricitons, Decreased mobility, Decreased range of motion, Abnormal gait, Decreased activity tolerance, Decreased endurance, Decreased strength,  Pain  Visit Diagnosis: 1. Acute pain of right knee   2. Muscle weakness (generalized)   3. Localized edema   4. Other abnormalities of gait and mobility        Problem List Patient Active Problem List   Diagnosis Date Noted  . 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 Acute cystitis without hematuria 03/21/2018  . History of left knee replacement 02/10/2018  . Morbid obesity ([place]) 11/21/2017  . Primary osteoarthritis of left knee 09/22/2017  . Primary osteoarthritis of right knee 09/22/2017  . Attention deficit hyperactivity disorder (ADHD) 05/12/2016  . Episodic mood disorder ([place]) 05/12/2016  . Type 2 diabetes mellitus without complication, without long-term current use of insulin ([place]) 05/12/2016  . Gastroesophageal reflux disease without esophagitis 05/12/2016  . DYSPNEA 07/22/2010  . CHEST PAIN 07/22/2010  . Hypothyroidism 09/13/2007  . Hyperlipidemia 09/13/2007  . ANXIETY DEPRESSION 09/13/2007  . MIGRAINE HEADACHE 09/13/2007  . Essential hypertension 09/13/2007  . ASTHMA 09/13/2007  . CHOLELITHIASIS, HX OF 09/13/2007  . NEPHROLITHIASIS, HX OF 09/13/2007  . CARPAL TUNNEL RELEASE, HX OF 09/13/2007    Darenda Fike Nilda Simmer PT, MPH  11/17/2018, 12:19 PM  [facility] [place], [place], [address] Phone: [phone]   Fax:  [phone]  Name: Judith [name] MRN: [id number] Date of Birth: Feb 25, 1971

## 2018-11-18 LAB — TOXASSURE SELECT 13 (MW), URINE

## 2018-11-20 NOTE — Progress Notes (Signed)
BP (!) 143/88   Pulse 94   Temp 99.8 F (37.7 C) (Oral)   Ht _0  (1.727 m)   Wt 250 lb 6.4 oz (113.6 kg)   BMI 38.07 kg/m    Subjective:    Patient ID: Judith [name], female    DOB: 1971-02-06, 48 y.o.   MRN: [id number]  HPI: Judith [name] is a 48 y.o. female presenting on 11/16/2018 for Annual Exam  This patient comes in for annual well physical examination. All medications are reviewed today. There are no reports of any problems with the medications. All of the medical conditions are reviewed and updated.  Lab work is reviewed and will be ordered as medically necessary.    She is still in the time of being out from her knee replacement and physical therapy.  Papers will be sent to her insurance company.  She would like to have the physical therapy notes sent if possible.  She does need refills on her chronic medications for her chronic conditions including hypertension, hypothyroidism, diabetes, arthritis, mood disorder, anxiety, obesity, mood disorder, ADHD.  She has been fairly stable with her mood disorders and we are refilling her medication in the past she has been on Adderall and we will restart the medication for her.  She has no other issues at this time.  Past Medical History:  Diagnosis Date  . Asthma   . Bipolar 1 disorder ([place])   . Depression   . GERD (gastroesophageal reflux disease)   . Hyperlipemia   . Hypothyroid   . Migraines    Relevant past medical, surgical, family and social history reviewed and updated as indicated. Interim medical history since our last visit reviewed. Allergies and medications reviewed and updated. DATA REVIEWED: CHART IN EPIC  Family History reviewed for pertinent findings.  Review of Systems  Constitutional: Negative.  Negative for activity change, fatigue and fever.  HENT: Negative.   Eyes: Negative.   Respiratory: Negative.  Negative for cough.   Cardiovascular: Negative.  Negative for chest pain.  Gastrointestinal: Negative.   Negative for abdominal pain.  Endocrine: Negative.   Genitourinary: Negative.  Negative for dysuria.  Musculoskeletal: Positive for arthralgias and myalgias.  Skin: Negative.   Neurological: Positive for weakness.  Psychiatric/Behavioral: Positive for decreased concentration. The patient is nervous/anxious.     Allergies as of 11/16/2018      Reactions   Diphenhydramine Other (See Comments)   Hair feels like it is crawling   Morphine Nausea And Vomiting   PCA PUMP N/V IV PUSH IN ER NO PROBLEM PER PT.   Prednisone Nausea And Vomiting   Erythromycin Hives   Sulfonamide Derivatives Other (See Comments)   Does not take due to family history      Medication List       Accurate as of November 16, 2018 11:59 PM. If you have any questions, ask your nurse or doctor.        STOP taking these medications   azithromycin 250 MG tablet Commonly known as: Zithromax Z-Pak Stopped by: Terald Sleeper, PA-C   doxepin 25 MG capsule Commonly known as: SINEQUAN Stopped by: Terald Sleeper, PA-C   methylPREDNISolone 4 MG Tbpk tablet Commonly known as: MEDROL DOSEPAK Stopped by: Terald Sleeper, PA-C   ondansetron 8 MG disintegrating tablet Commonly known as: Zofran ODT Stopped by: Terald Sleeper, PA-C   silver sulfADIAZINE 1 % cream Commonly known as: SILVADENE Stopped by: Terald Sleeper, PA-C  SUMAtriptan Succinate 4 MG/0.5ML Soct Stopped by: Terald Sleeper, PA-C     TAKE these medications   albuterol (2.5 MG/3ML) 0.083% nebulizer solution Commonly known as: PROVENTIL Nebulize 1 vial three times daily   aspirin EC 81 MG tablet Take 162 mg by mouth daily.   atorvastatin 10 MG tablet Commonly known as: LIPITOR Take 1 tablet (10 mg total) by mouth daily.   busPIRone 15 MG tablet Commonly known as: BUSPAR Take 15 mg by mouth 3 (three) times daily.   cetirizine 10 MG tablet Commonly known as: ZYRTEC Take 1 tablet (10 mg total) by mouth daily. What changed:   how much to take   when to take this   diclofenac sodium 1 % Gel Commonly known as: VOLTAREN Apply 4 g topically 4 (four) times daily.   dicyclomine 20 MG tablet Commonly known as: BENTYL Take 1 tablet (20 mg total) by mouth every 6 (six) hours.   fluconazole 150 MG tablet Commonly known as: Diflucan 1 po q week x 4 weeks   hydrOXYzine 25 MG tablet Commonly known as: ATARAX/VISTARIL Take 1 tablet (25 mg total) by mouth 3 (three) times daily.   ibuprofen 800 MG tablet Commonly known as: ADVIL TAKE 1 TABLET BY MOUTH EVERY 8 HOURS AS NEEDED   lamoTRIgine 150 MG tablet Commonly known as: LAMICTAL Take 1 tablet (150 mg total) by mouth 2 (two) times daily.   levothyroxine 125 MCG tablet Commonly known as: SYNTHROID TAKE ONE TAB BY MOUTH DAILY   lisdexamfetamine 50 MG capsule Commonly known as: Vyvanse Take 1 capsule (50 mg total) by mouth daily. What changed:   medication strength  how much to take Changed by: Terald Sleeper, PA-C   lisinopril 20 MG tablet Commonly known as: ZESTRIL Take 1 tablet (20 mg total) by mouth daily.   nystatin 100000 UNIT/ML suspension Commonly known as: MYCOSTATIN Take 5 mLs (500,000 Units total) by mouth 4 (four) times daily.   pantoprazole 40 MG tablet Commonly known as: PROTONIX TAKE ONE TAB BY MOUTH 2 TIMES DAILY   pioglitazone 15 MG tablet Commonly known as: ACTOS Take 1 tablet (15 mg total) by mouth daily.   sertraline 100 MG tablet Commonly known as: ZOLOFT Take 2 tablets (200 mg total) by mouth daily.   Symbicort 160-4.5 MCG/ACT inhaler Generic drug: budesonide-formoterol TAKE 2 PUFFS BY MOUTH TWICE A DAY   traMADol 50 MG tablet Commonly known as: ULTRAM Take 1 tablet (50 mg total) by mouth every 8 (eight) hours as needed for severe pain.          Objective:    BP (!) 143/88   Pulse 94   Temp 99.8 F (37.7 C) (Oral)   Ht _0  (1.727 m)   Wt 250 lb 6.4 oz (113.6 kg)   BMI 38.07 kg/m   Allergies  Allergen Reactions  .  Diphenhydramine Other (See Comments)    Hair feels like it is crawling  . Morphine Nausea And Vomiting    PCA PUMP N/V IV PUSH IN ER NO PROBLEM PER PT.  . Prednisone Nausea And Vomiting  . Erythromycin Hives  . Sulfonamide Derivatives Other (See Comments)    Does not take due to family history    Wt Readings from Last 3 Encounters:  11/16/18 250 lb 6.4 oz (113.6 kg)  06/15/18 255 lb (115.7 kg)  05/24/18 248 lb 9.6 oz (112.8 kg)    Physical Exam Constitutional:      Appearance: She is well-developed.  HENT:     Head: Normocephalic and atraumatic.  Eyes:     Conjunctiva/sclera: Conjunctivae normal.     Pupils: Pupils are equal, round, and reactive to light.  Neck:     Musculoskeletal: Normal range of motion and neck supple.  Cardiovascular:     Rate and Rhythm: Normal rate and regular rhythm.     Heart sounds: Normal heart sounds.  Pulmonary:     Effort: Pulmonary effort is normal.     Breath sounds: Normal breath sounds.  Chest:     Breasts: Breasts are symmetrical.        Right: No mass, skin change or tenderness.        Left: No mass, skin change or tenderness.  Abdominal:     General: Bowel sounds are normal.     Palpations: Abdomen is soft.  Genitourinary:    Labia:        Right: No tenderness or lesion.        Left: No tenderness or lesion.      Vagina: Normal. No vaginal discharge, tenderness or bleeding.     Cervix: No cervical motion tenderness, discharge or friability.     Uterus: Not deviated, not enlarged and not tender.      Adnexa:        Right: No mass, tenderness or fullness.         Left: No mass, tenderness or fullness.       Rectum: No anal fissure.  Skin:    General: Skin is warm and dry.     Findings: No rash.  Neurological:     Mental Status: She is alert and oriented to person, place, and time.     Deep Tendon Reflexes: Reflexes are normal and symmetric.  Psychiatric:        Behavior: Behavior normal.        Thought Content: Thought  content normal.        Judgment: Judgment normal.     Results for orders placed or performed in visit on 11/16/18  CMP14+EGFR  Result Value Ref Range   Glucose 452 (HH) 65 - 99 mg/dL   BUN 15 6 - 24 mg/dL   Creatinine, Ser 0.93 0.57 - 1.00 mg/dL   GFR calc non Af Amer 73 >59 mL/min/1.73   GFR calc Af Amer 84 >59 mL/min/1.73   BUN/Creatinine Ratio 16 9 - 23   Sodium 137 134 - 144 mmol/L   Potassium 4.5 3.5 - 5.2 mmol/L   Chloride 97 96 - 106 mmol/L   CO2 21 20 - 29 mmol/L   Calcium 9.3 8.7 - 10.2 mg/dL   Total Protein 7.6 6.0 - 8.5 g/dL   Albumin 4.4 3.8 - 4.8 g/dL   Globulin, Total 3.2 1.5 - 4.5 g/dL   Albumin/Globulin Ratio 1.4 1.2 - 2.2   Bilirubin Total 0.3 0.0 - 1.2 mg/dL   Alkaline Phosphatase 129 (H) 39 - 117 IU/L   AST 13 0 - 40 IU/L   ALT 12 0 - 32 IU/L  CBC with Differential/Platelet  Result Value Ref Range   WBC 7.9 3.4 - 10.8 x10E3/uL   RBC 5.66 (H) 3.77 - 5.28 x10E6/uL   Hemoglobin 14.8 11.1 - 15.9 g/dL   Hematocrit 45.2 34.0 - 46.6 %   MCV 80 79 - 97 fL   MCH 26.1 (L) 26.6 - 33.0 pg   MCHC 32.7 31.5 - 35.7 g/dL   RDW 13.0 11.7 - 15.4 %   Platelets 370  150 - 450 x10E3/uL   Neutrophils 57 Not Estab. %   Lymphs 33 Not Estab. %   Monocytes 7 Not Estab. %   Eos 2 Not Estab. %   Basos 1 Not Estab. %   Neutrophils Absolute 4.5 1.4 - 7.0 x10E3/uL   Lymphocytes Absolute 2.6 0.7 - 3.1 x10E3/uL   Monocytes Absolute 0.5 0.1 - 0.9 x10E3/uL   EOS (ABSOLUTE) 0.1 0.0 - 0.4 x10E3/uL   Basophils Absolute 0.1 0.0 - 0.2 x10E3/uL   Immature Granulocytes 0 Not Estab. %   Immature Grans (Abs) 0.0 0.0 - 0.1 x10E3/uL  Bayer DCA Hb A1c Waived  Result Value Ref Range   HB A1C (BAYER DCA - WAIVED) >14.0 (H) <7.0 %  Microalbumin / creatinine urine ratio  Result Value Ref Range   Creatinine, Urine 59.7 Not Estab. mg/dL   Microalbumin, Urine 16.8 Not Estab. ug/mL   Microalb/Creat Ratio 28 0 - 29 mg/g creat  ToxASSURE Select 13 (MW), Urine  Result Value Ref Range   Summary  Note       Assessment & Plan:   1. Well adult exam - CMP14+EGFR - CBC with Differential/Platelet - Bayer DCA Hb A1c Waived  2. Well female exam with routine gynecological exam - Pap IG w/ reflex to HPV when ASC-U ([facility])  3. Essential hypertension - CMP14+EGFR - CBC with Differential/Platelet - Bayer DCA Hb A1c Waived - Microalbumin / creatinine urine ratio - lisinopril (ZESTRIL) 20 MG tablet; Take 1 tablet (20 mg total) by mouth daily.  Dispense: 90 tablet; Refill: 3  4. Type 2 diabetes mellitus without complication, without long-term current use of insulin (HCC) - CMP14+EGFR - CBC with Differential/Platelet - Bayer DCA Hb A1c Waived - Microalbumin / creatinine urine ratio - pioglitazone (ACTOS) 15 MG tablet; Take 1 tablet (15 mg total) by mouth daily.  Dispense: 90 tablet; Refill: 3  5. Morbid obesity ([place])  6. Hypothyroidism, unspecified type  7. Status post right knee replacement - ToxASSURE Select 13 (MW), Urine - diclofenac sodium (VOLTAREN) 1 % GEL; Apply 4 g topically 4 (four) times daily.  Dispense: 400 g; Refill: 2  8. History of left knee replacement - ToxASSURE Select 13 (MW), Urine - diclofenac sodium (VOLTAREN) 1 % GEL; Apply 4 g topically 4 (four) times daily.  Dispense: 400 g; Refill: 2  9. Bug bite, initial encounter - hydrOXYzine (ATARAX/VISTARIL) 25 MG tablet; Take 1 tablet (25 mg total) by mouth 3 (three) times daily.  Dispense: 90 tablet; Refill: 5  10. Episodic mood disorder (HCC) - lamoTRIgine (LAMICTAL) 150 MG tablet; Take 1 tablet (150 mg total) by mouth 2 (two) times daily.  Dispense: 60 tablet; Refill: 5 - sertraline (ZOLOFT) 100 MG tablet; Take 2 tablets (200 mg total) by mouth daily.  Dispense: 90 tablet; Refill: 3  11. Attention deficit disorder (ADD) without hyperactivity - lisdexamfetamine (VYVANSE) 50 MG capsule; Take 1 capsule (50 mg total) by mouth daily.  Dispense: 30 capsule; Refill: 0   Continue all other maintenance  medications as listed above.  Follow up plan: Return in about 4 weeks (around 12/14/2018) for recheck medications.  Educational handout given for [place] PA-C [place] [address]  [place], [place] [address] [phone]   11/20/2018, 10:29 PM

## 2018-11-21 ENCOUNTER — Encounter: Payer: BC Managed Care – PPO | Admitting: Physical Therapy

## 2018-11-22 LAB — PAP IG W/ RFLX HPV ASCU

## 2018-11-23 ENCOUNTER — Encounter: Payer: Self-pay | Admitting: Physical Therapy

## 2018-11-23 ENCOUNTER — Other Ambulatory Visit: Payer: Self-pay

## 2018-11-23 ENCOUNTER — Ambulatory Visit (INDEPENDENT_AMBULATORY_CARE_PROVIDER_SITE_OTHER): Payer: BC Managed Care – PPO | Admitting: Physical Therapy

## 2018-11-23 DIAGNOSIS — M25561 Pain in right knee: Secondary | ICD-10-CM

## 2018-11-23 DIAGNOSIS — R600 Localized edema: Secondary | ICD-10-CM | POA: Diagnosis not present

## 2018-11-23 DIAGNOSIS — M6281 Muscle weakness (generalized): Secondary | ICD-10-CM | POA: Diagnosis not present

## 2018-11-23 NOTE — Therapy (Signed)
[facility] [place], [place], [address] Phone: [phone]   Fax:  [phone]  Physical Therapy Treatment  Patient Details  Name: Judith [name] MRN: [id number] Date of Birth: 08/24/1970 Referring Provider (PT): Dr Levan Hurst    Encounter Date: 11/23/2018  PT End of Session - 11/23/18 1207    Visit Number  21    Number of Visits  28    Date for PT Re-Evaluation  12/26/18    PT Start Time  1205    PT Stop Time  1245    PT Time Calculation (min)  40 min    Activity Tolerance  Patient tolerated treatment well    Behavior During Therapy  [facility] for tasks assessed/performed       Past Medical History:  Diagnosis Date  . Asthma   . Bipolar 1 disorder ([place])   . Depression   . GERD (gastroesophageal reflux disease)   . Hyperlipemia   . Hypothyroid   . Migraines     Past Surgical History:  Procedure Laterality Date  . ABDOMINAL SURGERY    . CARPAL TUNNEL RELEASE Bilateral 1999  . CESAREAN SECTION    . CHOLECYSTECTOMY    . THYROIDECTOMY    . TONSILLECTOMY      There were no vitals filed for this visit.  Subjective Assessment - 11/23/18 1208    Subjective  Pt reports that both knees flared up after last session, with some swelling.  She felt like even her Lt knee was buckling the following day.    Pertinent History  Lt TKA 01/20/18; asthma; HTN; gall bladder removed; depression; anxiety; c-section     Patient Stated Goals  back to work     Currently in Pain?  Yes    Pain Score  2     Pain Location  Knee    Pain Orientation  Right;Left    Pain Descriptors / Indicators  Aching;Dull    Pain Radiating Towards  to lateral side of knee    Aggravating Factors   rainy cold weather; work    Pain Relieving Factors  rest, cold, meds         [facility] PT Assessment - 11/23/18 0001      AROM   Right Knee Extension  -5       [facility] Adult PT Treatment/Exercise - 11/23/18 0001      Knee/Hip Exercises:  Stretches   Passive Hamstring Stretch Limitations  seated HS stretch 30 sec x 3    fwd lean with quad set x ~ 5-7 reps    Gastroc Stretch  Both;30 seconds;3 reps   incline board      Knee/Hip Exercises: Aerobic   Tread Mill  2.3 mph x 2 min; retro @1 .3 mph x 2 min     Recumbent Bike  L1 x 2 min; L2 x 3 min     Other Aerobic  laps around gym in between exercises to decrease stiffness, cues to increase Rt heel strike.       Knee/Hip Exercises: Standing   Step Down  Right;15 reps;Step Height: 6";Hand Hold: 0;Hand Hold: 1   stepping up backward with Rt; down with Lt    SLS  30 sec x 1 reps each LE on blue cushion- intermittent UE support    SLS with Vectors  SLS Rt with reach forward to touch chair x 10, x 5 LLE (very challenging)      Knee/Hip Exercises: Seated  Long [employer]  Strengthening;Right;1 set;15 reps   5 sec hold and eccentric lowering      Knee/Hip Exercises: Supine   Straight Leg Raises  AROM;Strengthening;Right;10 reps   quad sest to focus on TKE then lift    Straight Leg Raises Limitations  (eccentric lowering)    Other Supine Knee/Hip Exercises  Lower trunk rotation x 3 reps of 10 sec each side.                   PT Long Term Goals - 11/15/18 1105      PT LONG TERM GOAL #1   Title  Increase ROM Rt knee to 0 deg extension - 125 deg flexion 12/26/2018    Time  20    Period  Weeks    Status  Revised      PT LONG TERM GOAL #2   Title  5/5 strength Rt LE 12/26/2018    Time  20    Period  Weeks    Status  Revised      PT LONG TERM GOAL #3   Title  Patient tolerating 45-60 min of standing and or walking at work with minimal difficulty 12/26/2018    Time  20    Period  Weeks    Status  Revised      PT LONG TERM GOAL #4   Title  Independent in HEP 11/25/2018    Time  20    Period  Weeks    Status  Revised      PT LONG TERM GOAL #5   Title  Improve FOTO to </= 43% limitation 12/26/2018    Time  20    Period  Weeks    Status  Revised             Plan - 11/23/18 1237    Clinical Impression Statement  Pt was able to tolerate all exercises well without increase in symptoms.  Rt knee ROM remains unchanged.  Rt SLS exercises remain challenging (especially forward leans), but are improving.  Pt gradually progressing towards remaining goals.    Examination-Activity Limitations  Squat;Stairs;Locomotion Level;Stand;Sit;Other    Rehab Potential  Good    PT Frequency  2x / week    PT Duration  4 weeks    PT Treatment/Interventions  Patient/family education;ADLs/Self Care Home Management;Cryotherapy;Electrical Stimulation;Iontophoresis 4mg /ml Dexamethasone;Moist Heat;Ultrasound;Dry needling;Manual techniques;Gait training;Stair training;Functional mobility training;Therapeutic activities;Therapeutic exercise;Balance training;Neuromuscular re-education;Taping    PT Next Visit Plan  continued focus on ROM and functional strengthening - focus on terminal knee extension    PT Home Exercise Plan  Access Code: [email or web address]    Consulted and Agree with Plan of Care  Patient       Patient will benefit from skilled therapeutic intervention in order to improve the following deficits and impairments:  Increased muscle spasms, Increased fascial restricitons, Decreased mobility, Decreased range of motion, Abnormal gait, Decreased activity tolerance, Decreased endurance, Decreased strength, Pain  Visit Diagnosis: 1. Acute pain of right knee   2. Muscle weakness (generalized)   3. Localized edema        Problem List Patient Active Problem List   Diagnosis Date Noted  . 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 Acute cystitis without hematuria 03/21/2018  . History of left knee replacement 02/10/2018  . Morbid obesity ([place]) 11/21/2017  . Primary osteoarthritis of left knee 09/22/2017  . Primary osteoarthritis of right knee 09/22/2017  . Attention deficit hyperactivity disorder (ADHD) 05/12/2016  . Episodic  mood disorder  ([place]) 05/12/2016  . Type 2 diabetes mellitus without complication, without long-term current use of insulin ([place]) 05/12/2016  . Gastroesophageal reflux disease without esophagitis 05/12/2016  . DYSPNEA 07/22/2010  . CHEST PAIN 07/22/2010  . Hypothyroidism 09/13/2007  . Hyperlipidemia 09/13/2007  . ANXIETY DEPRESSION 09/13/2007  . MIGRAINE HEADACHE 09/13/2007  . Essential hypertension 09/13/2007  . ASTHMA 09/13/2007  . CHOLELITHIASIS, HX OF 09/13/2007  . NEPHROLITHIASIS, HX OF 09/13/2007  . CARPAL TUNNEL RELEASE, HX OF 09/13/2007   Kerin Perna, PTA 11/23/18 12:58 PM  [facility] [place], [place], [address] Phone: [phone]   Fax:  [phone]  Name: AHLIA LEMANSKI MRN: [id number] Date of Birth: 07/06/1970

## 2018-11-25 ENCOUNTER — Encounter: Payer: BC Managed Care – PPO | Admitting: Physical Therapy

## 2018-11-28 ENCOUNTER — Encounter: Payer: BC Managed Care – PPO | Admitting: Rehabilitative and Restorative Service Providers"

## 2018-11-30 ENCOUNTER — Encounter: Payer: BC Managed Care – PPO | Admitting: Physical Therapy

## 2018-12-02 ENCOUNTER — Ambulatory Visit (INDEPENDENT_AMBULATORY_CARE_PROVIDER_SITE_OTHER): Payer: BC Managed Care – PPO | Admitting: Physical Therapy

## 2018-12-02 ENCOUNTER — Other Ambulatory Visit: Payer: Self-pay

## 2018-12-02 DIAGNOSIS — M6281 Muscle weakness (generalized): Secondary | ICD-10-CM | POA: Diagnosis not present

## 2018-12-02 DIAGNOSIS — M25561 Pain in right knee: Secondary | ICD-10-CM | POA: Diagnosis not present

## 2018-12-02 NOTE — Patient Instructions (Signed)
Access Code: [email or web address]  URL: [email or web address]  Date: 12/02/2018  Prepared by: Kerin Perna   Exercises  Forward T with Counter Support - 10 reps - 1 sets - 1x daily - 7x weekly  Figure 4 Bridge - 10 reps - 1-2 sets - 1-5 hold - 1x daily - 7x weekly

## 2018-12-02 NOTE — Therapy (Addendum)
[place], [place], [address] Phone: [phone]   Fax:  [phone]  Physical Therapy Treatment  Patient Details  Name: Judith [name] MRN: [id number] Date of Birth: 1970-07-22 Referring Provider (PT): Dr Levan Hurst    Encounter Date: 12/02/2018  PT End of Session - 12/02/18 1209    Visit Number  22    Number of Visits  28    Date for PT Re-Evaluation  12/26/18    PT Start Time  7654    PT Stop Time  6503    PT Time Calculation (min)  41 min    Activity Tolerance  Patient tolerated treatment well;No increased pain    Behavior During Therapy  WFL for tasks assessed/performed       Past Medical History:  Diagnosis Date  . Asthma   . Bipolar 1 disorder ([place])   . Depression   . GERD (gastroesophageal reflux disease)   . Hyperlipemia   . Hypothyroid   . Migraines     Past Surgical History:  Procedure Laterality Date  . ABDOMINAL SURGERY    . CARPAL TUNNEL RELEASE Bilateral 1999  . CESAREAN SECTION    . CHOLECYSTECTOMY    . THYROIDECTOMY    . TONSILLECTOMY      There were no vitals filed for this visit.  Subjective Assessment - 12/02/18 1210    Subjective  Patrese reports she has been working on knee extension when she gets up in the night to use restroom and then does quad sets until she falls asleep again. She reports her Rt knee incision hurt when she woke up.    Patient Stated Goals  back to work     Currently in Pain?  Yes    Pain Score  2     Pain Location  Knee    Pain Orientation  Right;Left;Anterior    Pain Descriptors / Indicators  Aching    Aggravating Factors   rainy weather, work    Pain Relieving Factors  rest, ice, meds         [facility] PT Assessment - 12/02/18 0001      Assessment   Medical Diagnosis  Rt TKA    Referring Provider (PT)  Dr Levan Hurst     Onset Date/Surgical Date  06/22/18    Hand Dominance  Right    Next MD Visit  to schedule     Prior Therapy  here for Lt TKA       AROM   Right Knee Extension  -4    Right Knee Flexion  125    Left Knee Extension  0    Left Knee Flexion  129      Flexibility   Quadriceps  bilat 107 deg       [facility] Adult PT Treatment/Exercise - 12/02/18 0001      Knee/Hip Exercises: Stretches   Passive Hamstring Stretch Limitations  seated HS stretch 30 sec x 3    fwd lean with quad set x ~ 5-7 reps    Quad Stretch  Right;Left;3 reps;30 seconds    Knee: Self-Stretch to increase Flexion  Right;Left;2 reps;10 seconds   foot on back of truck   Piriformis Stretch  Right;2 reps;30 seconds    Gastroc Stretch  Both;30 seconds;3 reps   incline board      Knee/Hip Exercises: Aerobic   Tread Mill  2.0 mph x 2 min; retro _0 .8 mph x 3 min  Recumbent Bike  L2 x 5 min     Other Aerobic  laps around gym in between exercises to decrease stiffness, cues to increase Rt heel strike.       Knee/Hip Exercises: Standing   SLS  30 sec x 1 rep on solid ground, 1 rep on blue cushion- intermittent UE support when on cushion    SLS with Vectors  SLS Rt with reach forward to touch chair x 10      Knee/Hip Exercises: Supine   Single Leg Bridge  Right;1 set;10 reps    Straight Leg Raises  AROM;Strengthening;Right;10 reps   quad sest to focus on TKE then lift    Straight Leg Raises Limitations  (eccentric lowering)    Straight Leg Raise with External Rotation  Right;1 set;10 reps        PT Long Term Goals - 12/02/18 1304      PT LONG TERM GOAL #1   Title  Increase ROM Rt knee to 0 deg extension - 125 deg flexion 12/26/2018    Time  20    Period  Weeks    Status  Partially Met      PT LONG TERM GOAL #2   Title  5/5 strength Rt LE 12/26/2018    Time  20    Period  Weeks    Status  Partially Met      PT LONG TERM GOAL #3   Title  Patient tolerating 45-60 min of standing and or walking at work with minimal difficulty 12/26/2018    Time  20    Period  Weeks    Status  Partially Met      PT LONG TERM  GOAL #4   Title  Independent in HEP 11/25/2018    Time  20    Period  Weeks    Status  On-going      PT LONG TERM GOAL #5   Title  Improve FOTO to </= 43% limitation 12/26/2018    Time  20    Period  Weeks    Status  On-going         Access Code: [email or web address]  URL: [email or web address]  Date: 12/02/2018  Prepared by: Kerin Perna   Exercises  Forward T with Counter Support - 10 reps - 1 sets - 1x daily - 7x weekly  Figure 4 Bridge - 10 reps - 1-2 sets - 1-5 hold - 1x daily - 7x weekly      Plan - 12/02/18 1259    Clinical Impression Statement  Pt initially reporting knee pain upon arrival. Rt knee pain ceased after completing prone quad stretch, Lt knee pain after completing bridges.  Pt's Rt knee ROM gradually improving.  Pt near meeting remaining goals.    Examination-Activity Limitations  Squat;Stairs;Locomotion Level;Stand;Sit;Other    Rehab Potential  Good    PT Frequency  2x / week    PT Duration  4 weeks    PT Treatment/Interventions  Patient/family education;ADLs/Self Care Home Management;Cryotherapy;Electrical Stimulation;Iontophoresis 31m/ml Dexamethasone;Moist Heat;Ultrasound;Dry needling;Manual techniques;Gait training;Stair training;Functional mobility training;Therapeutic activities;Therapeutic exercise;Balance training;Neuromuscular re-education;Taping    PT Next Visit Plan  continued focus on ROM and functional strengthening - focus on terminal knee extension    PT Home Exercise Plan  Access Code: [email or web address]   Consulted and Agree with Plan of Care  Patient       Patient will benefit from skilled therapeutic intervention in order to improve the following deficits and impairments:  Increased muscle spasms,  Increased fascial restricitons, Decreased mobility, Decreased range of motion, Abnormal gait, Decreased activity tolerance, Decreased endurance, Decreased strength, Pain  Visit Diagnosis: 1. Acute pain of right knee   2. Muscle  weakness (generalized)        Problem List Patient Active Problem List   Diagnosis Date Noted  . 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 Acute cystitis without hematuria 03/21/2018  . History of left knee replacement 02/10/2018  . Morbid obesity ([place]) 11/21/2017  . Primary osteoarthritis of left knee 09/22/2017  . Primary osteoarthritis of right knee 09/22/2017  . Attention deficit hyperactivity disorder (ADHD) 05/12/2016  . Episodic mood disorder ([place]) 05/12/2016  . Type 2 diabetes mellitus without complication, without long-term current use of insulin ([place]) 05/12/2016  . Gastroesophageal reflux disease without esophagitis 05/12/2016  . DYSPNEA 07/22/2010  . CHEST PAIN 07/22/2010  . Hypothyroidism 09/13/2007  . Hyperlipidemia 09/13/2007  . ANXIETY DEPRESSION 09/13/2007  . MIGRAINE HEADACHE 09/13/2007  . Essential hypertension 09/13/2007  . ASTHMA 09/13/2007  . CHOLELITHIASIS, HX OF 09/13/2007  . NEPHROLITHIASIS, HX OF 09/13/2007  . CARPAL TUNNEL RELEASE, HX OF 09/13/2007   Kerin Perna, PTA 12/02/18 1:30 PM  [place], [place], [address] Phone: [phone]   Fax:  [phone]  Name: HERMILA MILLIS MRN: [id number] Date of Birth: 1970/10/04

## 2018-12-07 ENCOUNTER — Encounter: Payer: BC Managed Care – PPO | Admitting: Physical Therapy

## 2018-12-09 ENCOUNTER — Encounter: Payer: BC Managed Care – PPO | Admitting: Physical Therapy

## 2018-12-19 ENCOUNTER — Other Ambulatory Visit: Payer: Self-pay

## 2018-12-19 ENCOUNTER — Ambulatory Visit (INDEPENDENT_AMBULATORY_CARE_PROVIDER_SITE_OTHER): Payer: BC Managed Care – PPO | Admitting: Physical Therapy

## 2018-12-19 ENCOUNTER — Encounter: Payer: Self-pay | Admitting: Physical Therapy

## 2018-12-19 DIAGNOSIS — M25561 Pain in right knee: Secondary | ICD-10-CM

## 2018-12-19 DIAGNOSIS — M6281 Muscle weakness (generalized): Secondary | ICD-10-CM | POA: Diagnosis not present

## 2018-12-19 DIAGNOSIS — R2689 Other abnormalities of gait and mobility: Secondary | ICD-10-CM

## 2018-12-19 DIAGNOSIS — R600 Localized edema: Secondary | ICD-10-CM

## 2018-12-19 NOTE — Therapy (Signed)
[place], [place], [address] Phone: [phone]   Fax:  [phone]  Physical Therapy Treatment  Patient Details  Name: Judith [name] MRN: [id number] Date of Birth: April 12, 1971 Referring Provider (PT): Dr Levan Hurst    Encounter Date: 12/19/2018  PT End of Session - 12/19/18 1604    Visit Number  23    Date for PT Re-Evaluation  12/26/18    PT Start Time  1600    PT Stop Time  0347    PT Time Calculation (min)  41 min    Activity Tolerance  Patient tolerated treatment well;No increased pain    Behavior During Therapy  WFL for tasks assessed/performed       Past Medical History:  Diagnosis Date  . Asthma   . Bipolar 1 disorder ([place])   . Depression   . GERD (gastroesophageal reflux disease)   . Hyperlipemia   . Hypothyroid   . Migraines     Past Surgical History:  Procedure Laterality Date  . ABDOMINAL SURGERY    . CARPAL TUNNEL RELEASE Bilateral 1999  . CESAREAN SECTION    . CHOLECYSTECTOMY    . THYROIDECTOMY    . TONSILLECTOMY      There were no vitals filed for this visit.  Subjective Assessment - 12/19/18 1604    Subjective  Pt reports she has been walking a lot more, and continues to work on HEP.  She reports both her knees have been painful for the last 3 days; unsure if it is the weather (rainy).    Patient Stated Goals  back to work     Currently in Pain?  Yes    Pain Score  4     Pain Location  Knee    Pain Orientation  Right;Left;Anterior    Pain Descriptors / Indicators  Shooting;Dull    Aggravating Factors   rainy weather, on legs too long    Pain Relieving Factors  rest, meds, ice         [facility] PT Assessment - 12/19/18 0001      Assessment   Medical Diagnosis  Rt TKA    Referring Provider (PT)  Dr Levan Hurst     Onset Date/Surgical Date  06/22/18    Hand Dominance  Right    Next MD Visit  to schedule     Prior Therapy  here for Lt TKA       AROM   Right Knee Extension  -5    Right Knee Flexion  125      Flexibility   Quadriceps  bilat 107 deg       [facility] Adult PT Treatment/Exercise - 12/19/18 0001      Knee/Hip Exercises: Stretches   Passive Hamstring Stretch  Right;2 reps;30 seconds    Quad Stretch  Right;Left;3 reps;20 seconds    Knee: Self-Stretch to increase Flexion  Right;Left;1 rep;20 seconds   foot on back of truck   Press photographer  Both;30 seconds;3 reps   incline board      Knee/Hip Exercises: Aerobic   Nustep  L5: 5 min     Other Aerobic  laps around gym in between exercises to decrease stiffness, cues to increase Rt heel strike.       Knee/Hip Exercises: Standing   Terminal Knee Extension  Right;1 set;10 reps   10 sec holds   Theraband Level (Terminal Knee Extension)  Level 4 (Blue)    Forward  Step Up  Right;1 set;10 reps   10" step, 1 hand support on surface   Step Down  Right;Step Height: 6";Hand Hold: 0;Hand Hold: 1;10 reps   stepping up backward with Rt; down with Lt    SLS  30 sec x 2 rep on solid ground,with intermittent UE support to steady.       Manual Therapy   Manual therapy comments  I strip of Rock tape applied with 25 % stretch to Rt medial/lateral knee and Lt medial knee at joint line, to decompress tissue, decrease pain.     Soft tissue mobilization  IASTM and STM Rt hamstring and prox calf.    prone, with feet off table in prone hang   Passive ROM  overpressure into Rt knee ext x 20 sec x 2 reps                   PT Long Term Goals - 12/02/18 1304      PT LONG TERM GOAL #1   Title  Increase ROM Rt knee to 0 deg extension - 125 deg flexion 12/26/2018    Time  20    Period  Weeks    Status  Partially Met      PT LONG TERM GOAL #2   Title  5/5 strength Rt LE 12/26/2018    Time  20    Period  Weeks    Status  Partially Met      PT LONG TERM GOAL #3   Title  Patient tolerating 45-60 min of standing and or walking at work with minimal difficulty 12/26/2018    Time   20    Period  Weeks    Status  Partially Met      PT LONG TERM GOAL #4   Title  Independent in HEP 11/25/2018    Time  20    Period  Weeks    Status  On-going      PT LONG TERM GOAL #5   Title  Improve FOTO to </= 43% limitation 12/26/2018    Time  20    Period  Weeks    Status  On-going            Plan - 12/19/18 1649    Clinical Impression Statement  Pt has been away, awaiting authorization for additional visits.  Rt knee ROM and quad flexibility remains the same as last assessment.  Will return to functional strengthening to assist with return to work.    Examination-Activity Limitations  Squat;Stairs;Locomotion Level;Stand;Sit;Other    Rehab Potential  Good    PT Frequency  2x / week    PT Duration  4 weeks    PT Treatment/Interventions  Patient/family education;ADLs/Self Care Home Management;Cryotherapy;Electrical Stimulation;Iontophoresis '4mg'$ /ml Dexamethasone;Moist Heat;Ultrasound;Dry needling;Manual techniques;Gait training;Stair training;Functional mobility training;Therapeutic activities;Therapeutic exercise;Balance training;Neuromuscular re-education;Taping    PT Next Visit Plan  continued focus on ROM and functional strengthening - focus on terminal knee extension    PT Home Exercise Plan  Access Code: [email or web address]    Consulted and Agree with Plan of Care  Patient       Patient will benefit from skilled therapeutic intervention in order to improve the following deficits and impairments:  Increased muscle spasms, Increased fascial restricitons, Decreased mobility, Decreased range of motion, Abnormal gait, Decreased activity tolerance, Decreased endurance, Decreased strength, Pain  Visit Diagnosis: 1. Acute pain of right knee   2. Muscle weakness (generalized)   3. Localized edema   4. Other abnormalities  of gait and mobility        Problem List Patient Active Problem List   Diagnosis Date Noted  . 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 Acute  cystitis without hematuria 03/21/2018  . History of left knee replacement 02/10/2018  . Morbid obesity ([place]) 11/21/2017  . Primary osteoarthritis of left knee 09/22/2017  . Primary osteoarthritis of right knee 09/22/2017  . Attention deficit hyperactivity disorder (ADHD) 05/12/2016  . Episodic mood disorder ([place]) 05/12/2016  . Type 2 diabetes mellitus without complication, without long-term current use of insulin ([place]) 05/12/2016  . Gastroesophageal reflux disease without esophagitis 05/12/2016  . DYSPNEA 07/22/2010  . CHEST PAIN 07/22/2010  . Hypothyroidism 09/13/2007  . Hyperlipidemia 09/13/2007  . ANXIETY DEPRESSION 09/13/2007  . MIGRAINE HEADACHE 09/13/2007  . Essential hypertension 09/13/2007  . ASTHMA 09/13/2007  . CHOLELITHIASIS, HX OF 09/13/2007  . NEPHROLITHIASIS, HX OF 09/13/2007  . CARPAL TUNNEL RELEASE, HX OF 09/13/2007   Kerin Perna, PTA 12/19/18 4:53 PM  [facility] [place], [place], [address] Phone: [phone]   Fax:  [phone]  Name: Judith [name] MRN: [id number] Date of Birth: 1970-08-04

## 2018-12-20 ENCOUNTER — Ambulatory Visit (INDEPENDENT_AMBULATORY_CARE_PROVIDER_SITE_OTHER): Payer: Medicaid Other | Admitting: Family Medicine

## 2018-12-20 ENCOUNTER — Encounter: Payer: Self-pay | Admitting: Family Medicine

## 2018-12-20 DIAGNOSIS — B373 Candidiasis of vulva and vagina: Secondary | ICD-10-CM

## 2018-12-20 DIAGNOSIS — B3731 Acute candidiasis of vulva and vagina: Secondary | ICD-10-CM

## 2018-12-20 MED ORDER — FLUCONAZOLE 150 MG PO TABS: ORAL_TABLET | ORAL | 0 refills | Status: DC

## 2018-12-20 NOTE — Progress Notes (Signed)
Virtual Visit via telephone Note Due to COVID-19 pandemic this visit was conducted virtually. This visit type was conducted due to national recommendations for restrictions regarding the COVID-19 Pandemic (e.g. social distancing, sheltering in place) in an effort to limit this patient's exposure and mitigate transmission in our community. All issues noted in this document were discussed and addressed.  A physical exam was not performed with this format.   I connected with Judith [name] on 12/20/18 at 1505 by telephone and verified that I am speaking with the correct person using two identifiers. Judith [name] is currently located at home and family is currently with them during visit. The provider, Monia Pouch, FNP is located in their office at time of visit.  I discussed the limitations, risks, security and privacy concerns of performing an evaluation and management service by telephone and the availability of in person appointments. I also discussed with the patient that there may be a patient responsible charge related to this service. The patient expressed understanding and agreed to proceed.  Subjective:  Patient ID: Judith [name], female    DOB: August 07, 1970, 48 y.o.   MRN: [id number]  Chief Complaint:  Vaginal Discharge   HPI: Judith [name] is a 48 y.o. female presenting on 12/20/2018 for Vaginal Discharge   Pt reports 3-4 days of vaginal discharge.   Vaginal Discharge The patient's primary symptoms include genital itching and vaginal discharge. The patient's pertinent negatives include no genital lesions, genital odor, genital rash, missed menses, pelvic pain or vaginal bleeding. This is a new problem. The current episode started in the past 7 days. The problem occurs constantly. The problem has been gradually worsening. The pain is mild. She is not pregnant. Pertinent negatives include no abdominal pain, anorexia, back pain, chills, constipation, diarrhea, discolored urine, dysuria, fever,  flank pain, frequency, headaches, hematuria, joint pain, joint swelling, nausea, painful intercourse, rash, sore throat, urgency or vomiting. The vaginal discharge was white and thick. There has been no bleeding. Nothing aggravates the symptoms. She has tried antifungals for the symptoms. The treatment provided no relief. No, her partner does not have an STD. She uses an IUD for contraception. Her past medical history is significant for an abdominal surgery and a Cesarean section.     Relevant past medical, surgical, family, and social history reviewed and updated as indicated.  Allergies and medications reviewed and updated.   Past Medical History:  Diagnosis Date  . Asthma   . Bipolar 1 disorder ([place])   . Depression   . GERD (gastroesophageal reflux disease)   . Hyperlipemia   . Hypothyroid   . Migraines     Past Surgical History:  Procedure Laterality Date  . ABDOMINAL SURGERY    . CARPAL TUNNEL RELEASE Bilateral 1999  . CESAREAN SECTION    . CHOLECYSTECTOMY    . THYROIDECTOMY    . TONSILLECTOMY      Social History   Socioeconomic History  . Marital status: Divorced    Spouse name: Not on file  . Number of children: Not on file  . Years of education: Not on file  . Highest education level: Not on file  Occupational History  . Not on file  Social Needs  . Financial resource strain: Not on file  . Food insecurity    Worry: Not on file    Inability: Not on file  . Transportation needs    Medical: Not on file    Non-medical: Not on file  Tobacco  Use  . Smoking status: Former Smoker  . Smokeless tobacco: Never Used  Substance and Sexual Activity  . Alcohol use: No  . Drug use: No  . Sexual activity: Yes    Birth control/protection: I.U.D.  Lifestyle  . Physical activity    Days per week: Not on file    Minutes per session: Not on file  . Stress: Not on file  Relationships  . Social Herbalist on phone: Not on file    Gets together: Not on file     Attends religious service: Not on file    Active member of club or organization: Not on file    Attends meetings of clubs or organizations: Not on file    Relationship status: Not on file  . Intimate partner violence    Fear of current or ex partner: Not on file    Emotionally abused: Not on file    Physically abused: Not on file    Forced sexual activity: Not on file  Other Topics Concern  . Not on file  Social History Narrative  . Not on file    Outpatient Encounter Medications as of 12/20/2018  Medication Sig  . albuterol (PROVENTIL) (2.5 MG/3ML) 0.083% nebulizer solution Nebulize 1 vial three times daily  . aspirin EC 81 MG tablet Take 162 mg by mouth daily.   Marland Kitchen atorvastatin (LIPITOR) 10 MG tablet Take 1 tablet (10 mg total) by mouth daily.  . busPIRone (BUSPAR) 15 MG tablet Take 15 mg by mouth 3 (three) times daily.   . cetirizine (ZYRTEC) 10 MG tablet Take 1 tablet (10 mg total) by mouth daily.  . diclofenac sodium (VOLTAREN) 1 % GEL Apply 4 g topically 4 (four) times daily.  Marland Kitchen dicyclomine (BENTYL) 20 MG tablet Take 1 tablet (20 mg total) by mouth every 6 (six) hours.  . fluconazole (DIFLUCAN) 150 MG tablet 1 po q week x 4 weeks  . hydrOXYzine (ATARAX/VISTARIL) 25 MG tablet Take 1 tablet (25 mg total) by mouth 3 (three) times daily.  Marland Kitchen ibuprofen (ADVIL) 800 MG tablet TAKE 1 TABLET BY MOUTH EVERY 8 HOURS AS NEEDED  . lamoTRIgine (LAMICTAL) 150 MG tablet Take 1 tablet (150 mg total) by mouth 2 (two) times daily.  Marland Kitchen levothyroxine (SYNTHROID, LEVOTHROID) 125 MCG tablet TAKE ONE TAB BY MOUTH DAILY  . lisdexamfetamine (VYVANSE) 50 MG capsule Take 1 capsule (50 mg total) by mouth daily.  Marland Kitchen lisinopril (ZESTRIL) 20 MG tablet Take 1 tablet (20 mg total) by mouth daily.  Marland Kitchen nystatin (MYCOSTATIN) 100000 UNIT/ML suspension Take 5 mLs (500,000 Units total) by mouth 4 (four) times daily.  . pantoprazole (PROTONIX) 40 MG tablet TAKE ONE TAB BY MOUTH 2 TIMES DAILY  . pioglitazone (ACTOS) 15 MG  tablet Take 1 tablet (15 mg total) by mouth daily.  . sertraline (ZOLOFT) 100 MG tablet Take 2 tablets (200 mg total) by mouth daily.  . SYMBICORT 160-4.5 MCG/ACT inhaler TAKE 2 PUFFS BY MOUTH TWICE A DAY  . traMADol (ULTRAM) 50 MG tablet Take 1 tablet (50 mg total) by mouth every 8 (eight) hours as needed for severe pain.  . [DISCONTINUED] fluconazole (DIFLUCAN) 150 MG tablet 1 po q week x 4 weeks   No facility-administered encounter medications on file as of 12/20/2018.     Allergies  Allergen Reactions  . Diphenhydramine Other (See Comments)    Hair feels like it is crawling  . Morphine Nausea And Vomiting    PCA PUMP N/V  IV PUSH IN ER NO PROBLEM PER PT.  . Prednisone Nausea And Vomiting  . Erythromycin Hives  . Sulfonamide Derivatives Other (See Comments)    Does not take due to family history    Review of Systems  Constitutional: Negative for activity change, appetite change, chills, diaphoresis, fatigue, fever and unexpected weight change.  HENT: Negative for sore throat.   Respiratory: Negative for cough and shortness of breath.   Cardiovascular: Negative for chest pain and palpitations.  Gastrointestinal: Negative for abdominal pain, anorexia, constipation, diarrhea, nausea and vomiting.  Genitourinary: Positive for vaginal discharge. Negative for decreased urine volume, difficulty urinating, dyspareunia, dysuria, enuresis, flank pain, frequency, genital sores, hematuria, menstrual problem, missed menses, pelvic pain, urgency, vaginal bleeding and vaginal pain.  Musculoskeletal: Negative for arthralgias, back pain, joint pain and joint swelling.  Skin: Negative for color change and rash.  Neurological: Negative for dizziness, syncope, weakness, light-headedness and headaches.  Psychiatric/Behavioral: Negative for confusion.  All other systems reviewed and are negative.        Observations/Objective: No vital signs or physical exam, this was a telephone or virtual  health encounter.  Pt alert and oriented, answers all questions appropriately, and able to speak in full sentences.    Assessment and Plan: Yarenis was seen today for vaginal discharge.  Diagnoses and all orders for this visit:  Candidal vaginitis Reported symptoms consistent with candidal vaginitis. Will treat with Diflucan. 1 tablet weekly for four weeks due to recurrent symptoms. Discussed better control of blood sugars to help prevent recurrent symptoms. Report any new, worsening, or persistent symptoms.  -     fluconazole (DIFLUCAN) 150 MG tablet; 1 po q week x 4 weeks     Follow Up Instructions: Return if symptoms worsen or fail to improve.    I discussed the assessment and treatment plan with the patient. The patient was provided an opportunity to ask questions and all were answered. The patient agreed with the plan and demonstrated an understanding of the instructions.   The patient was advised to call back or seek an in-person evaluation if the symptoms worsen or if the condition fails to improve as anticipated.  The above assessment and management plan was discussed with the patient. The patient verbalized understanding of and has agreed to the management plan. Patient is aware to call the clinic if symptoms persist or worsen. Patient is aware when to return to the clinic for a follow-up visit. Patient educated on when it is appropriate to go to the emergency department.    I provided 15 minutes of non-face-to-face time during this encounter. The call started at 1505. The call ended at 1520. The other time was used for coordination of care.    Monia Pouch, FNP-C [facility] [address] [place], [place] [address] [phone] 12/20/18

## 2018-12-22 ENCOUNTER — Ambulatory Visit (INDEPENDENT_AMBULATORY_CARE_PROVIDER_SITE_OTHER): Payer: BC Managed Care – PPO | Admitting: Physical Therapy

## 2018-12-22 ENCOUNTER — Other Ambulatory Visit: Payer: Self-pay

## 2018-12-22 DIAGNOSIS — R600 Localized edema: Secondary | ICD-10-CM

## 2018-12-22 DIAGNOSIS — M6281 Muscle weakness (generalized): Secondary | ICD-10-CM

## 2018-12-22 DIAGNOSIS — M25561 Pain in right knee: Secondary | ICD-10-CM

## 2018-12-22 NOTE — Patient Instructions (Signed)

## 2018-12-22 NOTE — Therapy (Signed)
[place], [place], [address] Phone: [phone]   Fax:  [phone]  Physical Therapy Treatment  Patient Details  Name: Judith [name] MRN: [id number] Date of Birth: 1971/04/07 Referring Provider (PT): Dr Levan Hurst    Encounter Date: 12/22/2018  PT End of Session - 12/22/18 1508    Visit Number  24    Number of Visits  28    Date for PT Re-Evaluation  12/26/18    PT Start Time  3817   pt arrived late   PT Stop Time  1540   MHP last 10 min   PT Time Calculation (min)  49 min    Activity Tolerance  Patient tolerated treatment well    Behavior During Therapy  [facility] for tasks assessed/performed       Past Medical History:  Diagnosis Date  . Asthma   . Bipolar 1 disorder ([place])   . Depression   . GERD (gastroesophageal reflux disease)   . Hyperlipemia   . Hypothyroid   . Migraines     Past Surgical History:  Procedure Laterality Date  . ABDOMINAL SURGERY    . CARPAL TUNNEL RELEASE Bilateral 1999  . CESAREAN SECTION    . CHOLECYSTECTOMY    . THYROIDECTOMY    . TONSILLECTOMY      There were no vitals filed for this visit.  Subjective Assessment - 12/22/18 1503    Subjective  "I feel like the more active I am, the more it (Rt knee) swells".  She stated she went to grocery store and washed dishes and she felt this made her knee swell some.    Patient Stated Goals  back to work     Currently in Pain?  Yes    Pain Score  3     Pain Location  Knee    Pain Orientation  Right    Pain Descriptors / Indicators  Dull    Aggravating Factors   rainy weather; on legs too long    Pain Relieving Factors  rest, medicine, ice         [facility] PT Assessment - 12/22/18 0001      Assessment   Medical Diagnosis  Rt TKA    Referring Provider (PT)  Dr Levan Hurst     Onset Date/Surgical Date  06/22/18    Hand Dominance  Right    Next MD Visit  to schedule     Prior Therapy  here for Lt  TKA       AROM   Right Knee Extension  -4      PROM   Right Knee Extension  -2       [facility] Adult PT Treatment/Exercise - 12/22/18 0001      Knee/Hip Exercises: Stretches   Passive Hamstring Stretch  Right;4 reps;30 seconds   2 reps with PTA assist, overpressure into ext    [employer]  Right;2 reps;30 seconds   prone quad stretch   Gastroc Stretch  Both;30 seconds;3 reps   incline board      Knee/Hip Exercises: Aerobic   Tread Mill  2.0 mph x 3 min; retro @ 1.4 mph x 3 min     Other Aerobic  laps around gym in between exercises to decrease stiffness, cues to increase Rt heel strike.       Modalities   Modalities  Moist Heat;Cryotherapy      Moist Heat Therapy   Number Minutes Moist  Heat  10 Minutes    Moist Heat Location  --   Rt hamstring, post knee     Cryotherapy   Number Minutes Cryotherapy  10 Minutes    Cryotherapy Location  Knee   ant Rt    Type of Cryotherapy  Ice pack      Manual Therapy   Manual therapy comments  Pt prone feet supported on bolster    Soft tissue mobilization  deep tissue work and IASTM Rt hamstring and ITB, prox calf      Passive ROM  overpressure into Rt knee ext x 20 sec x 2 reps        Trigger Point Dry Needling - 12/22/18 0001    Consent Given?  Yes    Education Handout Provided  Yes    Other Dry Needling  Rt     Hamstring Response  Palpable increased muscle length;Twitch response elicited    Tensor Fascia Lata Response  Palpable increased muscle length   distally - mid thigh area           PT Education - 12/22/18 1506    Education Details  DN info    Person(s) Educated  Patient    Methods  Explanation;Handout    Comprehension  Verbalized understanding          PT Long Term Goals - 12/02/18 1304      PT LONG TERM GOAL #1   Title  Increase ROM Rt knee to 0 deg extension - 125 deg flexion 12/26/2018    Time  20    Period  Weeks    Status  Partially Met      PT LONG TERM GOAL #2   Title  5/5 strength Rt LE  12/26/2018    Time  20    Period  Weeks    Status  Partially Met      PT LONG TERM GOAL #3   Title  Patient tolerating 45-60 min of standing and or walking at work with minimal difficulty 12/26/2018    Time  20    Period  Weeks    Status  Partially Met      PT LONG TERM GOAL #4   Title  Independent in HEP 11/25/2018    Time  20    Period  Weeks    Status  On-going      PT LONG TERM GOAL #5   Title  Improve FOTO to </= 43% limitation 12/26/2018    Time  20    Period  Weeks    Status  On-going            Plan - 12/22/18 1509    Clinical Impression Statement  Pt continues with limited Rt knee extension.  Trial of DN followed by IASTM to Rt hamstring/TFL - ITB. Patient tolerated DN well with decreased palpable tightness noted following treatment.    Rehab Potential  Good    PT Frequency  2x / week    PT Duration  4 weeks    PT Treatment/Interventions  Patient/family education;ADLs/Self Care Home Management;Cryotherapy;Electrical Stimulation;Iontophoresis '4mg'$ /ml Dexamethasone;Moist Heat;Ultrasound;Dry needling;Manual techniques;Gait training;Stair training;Functional mobility training;Therapeutic activities;Therapeutic exercise;Balance training;Neuromuscular re-education;Taping    PT Next Visit Plan  continued focus on ROM and functional strengthening - focus on terminal knee extension. assess response to DN.    PT Home Exercise Plan  Access Code: [email or web address]    Consulted and Agree with Plan of Care  Patient       Patient  will benefit from skilled therapeutic intervention in order to improve the following deficits and impairments:  Increased muscle spasms, Increased fascial restricitons, Decreased mobility, Decreased range of motion, Abnormal gait, Decreased activity tolerance, Decreased endurance, Decreased strength, Pain  Visit Diagnosis: 1. Acute pain of right knee   2. Muscle weakness (generalized)   3. Localized edema        Problem List Patient Active Problem  List   Diagnosis Date Noted  . 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 Acute cystitis without hematuria 03/21/2018  . History of left knee replacement 02/10/2018  . Morbid obesity ([place]) 11/21/2017  . Primary osteoarthritis of left knee 09/22/2017  . Primary osteoarthritis of right knee 09/22/2017  . Attention deficit hyperactivity disorder (ADHD) 05/12/2016  . Episodic mood disorder ([place]) 05/12/2016  . Type 2 diabetes mellitus without complication, without long-term current use of insulin ([place]) 05/12/2016  . Gastroesophageal reflux disease without esophagitis 05/12/2016  . DYSPNEA 07/22/2010  . CHEST PAIN 07/22/2010  . Hypothyroidism 09/13/2007  . Hyperlipidemia 09/13/2007  . ANXIETY DEPRESSION 09/13/2007  . MIGRAINE HEADACHE 09/13/2007  . Essential hypertension 09/13/2007  . ASTHMA 09/13/2007  . CHOLELITHIASIS, HX OF 09/13/2007  . NEPHROLITHIASIS, HX OF 09/13/2007  . CARPAL TUNNEL RELEASE, HX OF 09/13/2007   Kerin Perna, PTA 12/22/18 3:46 PM   Celyn P. Helene Kelp PT, MPH 12/22/18 3:46 PM    [facility] [place], [place], [address] Phone: [phone]   Fax:  [phone]  Name: Judith [name] MRN: [id number] Date of Birth: Nov 10, 1970

## 2018-12-26 ENCOUNTER — Encounter: Payer: BC Managed Care – PPO | Admitting: Rehabilitative and Restorative Service Providers"

## 2018-12-29 ENCOUNTER — Other Ambulatory Visit: Payer: Self-pay

## 2018-12-29 ENCOUNTER — Ambulatory Visit (INDEPENDENT_AMBULATORY_CARE_PROVIDER_SITE_OTHER): Payer: BC Managed Care – PPO | Admitting: Physical Therapy

## 2018-12-29 DIAGNOSIS — R600 Localized edema: Secondary | ICD-10-CM

## 2018-12-29 DIAGNOSIS — M25561 Pain in right knee: Secondary | ICD-10-CM

## 2018-12-29 DIAGNOSIS — M6281 Muscle weakness (generalized): Secondary | ICD-10-CM

## 2018-12-29 DIAGNOSIS — R2689 Other abnormalities of gait and mobility: Secondary | ICD-10-CM

## 2018-12-29 NOTE — Therapy (Signed)
[place], [place], [address] Phone: [phone]   Fax:  [phone]  Physical Therapy Treatment  Patient Details  Name: Judith [name] MRN: [id number] Date of Birth: [birth year] Referring Provider (PT): Dr Levan Hurst    Encounter Date: 12/29/2018  PT End of Session - 12/29/18 1455    Visit Number  25   4/8 remaining insurance approved visits.   Number of Visits  28    PT Start Time  1497    PT Stop Time  1525    PT Time Calculation (min)  40 min    Activity Tolerance  Patient tolerated treatment well    Behavior During Therapy  [facility] for tasks assessed/performed       Past Medical History:  Diagnosis Date  . Asthma   . Bipolar 1 disorder ([place])   . Depression   . GERD (gastroesophageal reflux disease)   . Hyperlipemia   . Hypothyroid   . Migraines     Past Surgical History:  Procedure Laterality Date  . ABDOMINAL SURGERY    . CARPAL TUNNEL RELEASE Bilateral 1999  . CESAREAN SECTION    . CHOLECYSTECTOMY    . THYROIDECTOMY    . TONSILLECTOMY      There were no vitals filed for this visit.  Subjective Assessment - 12/29/18 1457    Subjective  pt reports she thinks the DN helped some. Rt knee feels a little straighter. She has been driving stick shift, so both knees are irritated.    Currently in Pain?  Yes    Pain Score  4     Pain Location  Knee    Pain Orientation  Left;Right    Pain Descriptors / Indicators  Dull;Aching    Aggravating Factors   on legs too long; rainy weather    Pain Relieving Factors  rest, medicine, ice         [facility] PT Assessment - 12/29/18 0001      Assessment   Medical Diagnosis  Rt TKA    Referring Provider (PT)  Dr Levan Hurst     Onset Date/Surgical Date  06/22/18    Hand Dominance  Right    Next MD Visit  to schedule     Prior Therapy  here for Lt TKA       AROM   Right Knee Extension  -4      [facility] Adult PT Treatment/Exercise  - 12/29/18 0001      Knee/Hip Exercises: Stretches   Active Hamstring Stretch  Right;Left;3 reps;30 seconds   overpressure into ext    Gastroc Stretch  Both;30 seconds;3 reps   incline board    Other Knee/Hip Stretches  Rt/Lt knee flexion, forward lunge with foot on 28" surface x 10 sec x 2 reps       Knee/Hip Exercises: Aerobic   Elliptical  1.0 x 1 min - not tolerated due to increased Rt knee pain.     Tread Mill  2.0 mph x 2 min; retro @ 1.4 mph x 2 min     Other Aerobic  laps around gym in between exercises to decrease stiffness, cues to increase Rt heel strike.       Knee/Hip Exercises: Standing   Functional Squat  --    Functional Squat Limitations  14# box lift from floor to waist x 5; thigh height to chest x 8 reps     Stairs  10 steps forward,  reciprocal pattern with Lt railing, carrying package with Rt with retro step down x 3 reps (no wt first trial, 3# 2nd  trial, 12# 3rd trial) to simulate climbing work ladder.    Other Standing Knee Exercises  standing to/from high kneeling on 3" pad with intermittent UE support on black mat x 3 reps each side (to replicate getting down to stock shelves)  High kneeling to/from sitting to standing x 1 rep      Manual Therapy   Soft tissue mobilization  IASTM and STM Rt hamstring and prox calf.    prone, with feet off table in prone hang       PT Long Term Goals - 12/29/18 1551      PT LONG TERM GOAL #1   Title  Increase ROM Rt knee to 0 deg extension - 125 deg flexion 12/26/2018    Baseline  Rt knee 4-125 deg flexion 12/29/18    Time  20    Period  Weeks    Status  Partially Met      PT LONG TERM GOAL #2   Title  5/5 strength Rt LE 12/26/2018    Time  20    Period  Weeks    Status  Partially Met   will assess next visit     PT LONG TERM GOAL #3   Title  Patient tolerating 45-60 min of standing and or walking at work with minimal difficulty 12/26/2018    Baseline  walking in community for 60 min without difficulty; has not  returned to work yet.    Time  20    Period  Weeks    Status  Partially Met      PT LONG TERM GOAL #4   Title  Independent in HEP 11/25/2018    Time  20    Period  Weeks    Status  On-going      PT LONG TERM GOAL #5   Title  Improve FOTO to </= 43% limitation 12/26/2018    Time  20    Period  Weeks    Status  On-going   will assess next visit.           Plan - 12/29/18 1536    Clinical Impression Statement  Pt tolerated DN well last session.  Persistant tightness remains in Rt posterior LE; decreased palpable tightness and reported ease with gait after IASTM.  Trial of work related exercises to prepare pt to return to work; tolerated well with minimal increase in pain. Pt has partially met her goals and is near meeting remaing goals.    Rehab Potential  Good    PT Frequency  2x / week    PT Duration  4 weeks    PT Treatment/Interventions  Patient/family education;ADLs/Self Care Home Management;Cryotherapy;Electrical Stimulation;Iontophoresis '4mg'$ /ml Dexamethasone;Moist Heat;Ultrasound;Dry needling;Manual techniques;Gait training;Stair training;Functional mobility training;Therapeutic activities;Therapeutic exercise;Balance training;Neuromuscular re-education;Taping    PT Next Visit Plan  continued focus on ROM and functional strengthening - focus on terminal knee extension. continue DN/manual therapy as indicated.    PT Home Exercise Plan  Access Code: [email or web address]    Consulted and Agree with Plan of Care  Patient       Patient will benefit from skilled therapeutic intervention in order to improve the following deficits and impairments:  Increased muscle spasms, Increased fascial restricitons, Decreased mobility, Decreased range of motion, Abnormal gait, Decreased activity tolerance, Decreased endurance, Decreased strength, Pain  Visit Diagnosis: 1. Acute pain of right knee  2. Muscle weakness (generalized)   3. Localized edema   4. Other abnormalities of gait and  mobility        Problem List Patient Active Problem List   Diagnosis Date Noted  . 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 Acute cystitis without hematuria 03/21/2018  . History of left knee replacement 02/10/2018  . Morbid obesity ([place]) 11/21/2017  . Primary osteoarthritis of left knee 09/22/2017  . Primary osteoarthritis of right knee 09/22/2017  . Attention deficit hyperactivity disorder (ADHD) 05/12/2016  . Episodic mood disorder ([place]) 05/12/2016  . Type 2 diabetes mellitus without complication, without long-term current use of insulin ([place]) 05/12/2016  . Gastroesophageal reflux disease without esophagitis 05/12/2016  . DYSPNEA 07/22/2010  . CHEST PAIN 07/22/2010  . Hypothyroidism 09/13/2007  . Hyperlipidemia 09/13/2007  . ANXIETY DEPRESSION 09/13/2007  . MIGRAINE HEADACHE 09/13/2007  . Essential hypertension 09/13/2007  . ASTHMA 09/13/2007  . CHOLELITHIASIS, HX OF 09/13/2007  . NEPHROLITHIASIS, HX OF 09/13/2007  . CARPAL TUNNEL RELEASE, HX OF 09/13/2007   Kerin Perna, PTA 12/29/18 3:54 PM  [place], [place], [address] Phone: [phone]   Fax:  [phone]  Name: Judith [name] MRN: [id number] Date of Birth: March 02, 1971

## 2018-12-30 ENCOUNTER — Telehealth: Payer: Self-pay | Admitting: Physician Assistant

## 2019-01-02 ENCOUNTER — Ambulatory Visit: Payer: Medicaid Other | Admitting: Physician Assistant

## 2019-01-04 ENCOUNTER — Encounter: Payer: Self-pay | Admitting: Rehabilitative and Restorative Service Providers"

## 2019-01-04 ENCOUNTER — Other Ambulatory Visit: Payer: Self-pay

## 2019-01-04 ENCOUNTER — Ambulatory Visit (INDEPENDENT_AMBULATORY_CARE_PROVIDER_SITE_OTHER): Payer: BC Managed Care – PPO | Admitting: Rehabilitative and Restorative Service Providers"

## 2019-01-04 ENCOUNTER — Ambulatory Visit: Payer: BLUE CROSS/BLUE SHIELD | Admitting: Adult Health

## 2019-01-04 DIAGNOSIS — R600 Localized edema: Secondary | ICD-10-CM

## 2019-01-04 DIAGNOSIS — M6281 Muscle weakness (generalized): Secondary | ICD-10-CM

## 2019-01-04 DIAGNOSIS — R2689 Other abnormalities of gait and mobility: Secondary | ICD-10-CM | POA: Diagnosis not present

## 2019-01-04 DIAGNOSIS — M25561 Pain in right knee: Secondary | ICD-10-CM | POA: Diagnosis not present

## 2019-01-04 DIAGNOSIS — M25562 Pain in left knee: Secondary | ICD-10-CM

## 2019-01-04 NOTE — Addendum Note (Signed)
Addended by: Everardo All on: 01/04/2019 03:25 PM   Modules accepted: Orders

## 2019-01-04 NOTE — Therapy (Signed)
[place], [place], [address] Phone: [phone]   Fax:  [phone]  Physical Therapy Treatment  Patient Details  Name: RANIYAH CURENTON MRN: [id number] Date of Birth: 01/11/1971 Referring Provider (PT): Dr Levan Hurst    Encounter Date: 01/04/2019  PT End of Session - 01/04/19 1444    Visit Number  26    Number of Visits  28    PT Start Time  8372    PT Stop Time  1515    PT Time Calculation (min)  30 min    Activity Tolerance  Patient tolerated treatment well       Past Medical History:  Diagnosis Date  . Asthma   . Bipolar 1 disorder ([place])   . Depression   . GERD (gastroesophageal reflux disease)   . Hyperlipemia   . Hypothyroid   . Migraines     Past Surgical History:  Procedure Laterality Date  . ABDOMINAL SURGERY    . CARPAL TUNNEL RELEASE Bilateral 1999  . CESAREAN SECTION    . CHOLECYSTECTOMY    . THYROIDECTOMY    . TONSILLECTOMY      There were no vitals filed for this visit.  Subjective Assessment - 01/04/19 1444    Subjective  working harder on ROM - may have made the knee sore - hurts more in the past couple of days. Has changed insurances. Feels she can continue with her independent HEP and will call if she needs additional treatment.    Currently in Pain?  Yes    Pain Score  4     Pain Location  Knee    Pain Orientation  Right;Left    Pain Descriptors / Indicators  Aching;Dull                       [facility] Adult PT Treatment/Exercise - 01/04/19 0001      Knee/Hip Exercises: Stretches   Active Hamstring Stretch  Right;Left;3 reps;30 seconds   overpressure into ext    Gastroc Stretch  Both;30 seconds;3 reps   incline board    Other Knee/Hip Stretches  hip adductor stretch supine with strap 30 sec x 3 reps Rt LE       Manual Therapy   Manual therapy comments  Pt prone feet supported on bolster; supine LE's supported on bolster     Soft tissue  mobilization  deep tissue work Rt proximal calf and distal hamstrings/adductors        Trigger Point Dry Needling - 01/04/19 0001    Consent Given?  Yes    Education Handout Provided  Previously provided    Other Dry Needling  Rt    Adductor Response  Palpable increased muscle length    Vastus medialis Response  Palpable increased muscle length    Hamstring Response  Palpable increased muscle length    Soleus Response  Palpable increased muscle length                PT Long Term Goals - 01/04/19 1520      PT LONG TERM GOAL #1   Title  Increase ROM Rt knee to 0 deg extension - 125 deg flexion 12/26/2018    Baseline  Rt knee 4-125 deg flexion 12/29/18    Time  20    Period  Weeks    Status  Partially Met      PT LONG TERM GOAL #2   Title  5/5 strength Rt LE 12/26/2018    Time  20    Period  Weeks    Status  Partially Met   will assess next visit     PT LONG TERM GOAL #3   Title  Patient tolerating 45-60 min of standing and or walking at work with minimal difficulty 12/26/2018    Baseline  walking in community for 60 min without difficulty; has not returned to work yet.    Time  20    Period  Weeks    Status  Partially Met      PT LONG TERM GOAL #4   Title  Independent in HEP 11/25/2018    Time  20    Period  Weeks    Status  On-going      PT LONG TERM GOAL #5   Title  Improve FOTO to </= 43% limitation 12/26/2018    Time  20    Period  Weeks    Status  On-going   will assess next visit.           Plan - 01/04/19 1512    Clinical Impression Statement  Continues to work on ROM and strengthening at home. Not sure she will continue with PT - has all the tools for continuing to work on her knee rehab at home. She responded well to DN Rt calf and thigh. Will await patient's decision on further therapy.    Rehab Potential  Good    PT Frequency  2x / week    PT Duration  4 weeks    PT Treatment/Interventions  Patient/family education;ADLs/Self Care Home  Management;Cryotherapy;Electrical Stimulation;Iontophoresis 54m/ml Dexamethasone;Moist Heat;Ultrasound;Dry needling;Manual techniques;Gait training;Stair training;Functional mobility training;Therapeutic activities;Therapeutic exercise;Balance training;Neuromuscular re-education;Taping    PT Next Visit Plan  patient will continue with HEP - call to schedule as needed    PT Home Exercise Plan  Access Code: [email or web address]   Consulted and Agree with Plan of Care  Patient       Patient will benefit from skilled therapeutic intervention in order to improve the following deficits and impairments:  Increased muscle spasms, Increased fascial restricitons, Decreased mobility, Decreased range of motion, Abnormal gait, Decreased activity tolerance, Decreased endurance, Decreased strength, Pain  Visit Diagnosis: 1. Acute pain of right knee   2. Muscle weakness (generalized)   3. Localized edema   4. Other abnormalities of gait and mobility   5. Acute pain of left knee        Problem List Patient Active Problem List   Diagnosis Date Noted  . 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 Acute cystitis without hematuria 03/21/2018  . History of left knee replacement 02/10/2018  . Morbid obesity ([place] 11/21/2017  . Primary osteoarthritis of left knee 09/22/2017  . Primary osteoarthritis of right knee 09/22/2017  . Attention deficit hyperactivity disorder (ADHD) 05/12/2016  . Episodic mood disorder ([place] 05/12/2016  . Type 2 diabetes mellitus without complication, without long-term current use of insulin ([place] 05/12/2016  . Gastroesophageal reflux disease without esophagitis 05/12/2016  . DYSPNEA 07/22/2010  . CHEST PAIN 07/22/2010  . Hypothyroidism 09/13/2007  . Hyperlipidemia 09/13/2007  . ANXIETY DEPRESSION 09/13/2007  . MIGRAINE HEADACHE 09/13/2007  . Essential hypertension 09/13/2007  . ASTHMA 09/13/2007  . CHOLELITHIASIS, HX OF 09/13/2007  . NEPHROLITHIASIS, HX OF 09/13/2007   . CARPAL TUNNEL RELEASE, HX OF 09/13/2007    Mayla Biddy PNilda SimmerPT, MPH  01/04/2019, 4:14 PM  [facility] [phone]  [place], [place], [address] Phone: [phone]   Fax:  [phone]  Name: BLIMI GODBY MRN: [id number] Date of Birth: Oct 25, 1970  PHYSICAL THERAPY DISCHARGE SUMMARY  Visits from Start of Care: 26  Current functional level related to goals / functional outcomes: See progress notes for discharge status    Remaining deficits: Some continued intermittent pain; slight limitation in end range knee extension    Education / Equipment: HEP  Plan: Patient agrees to discharge.  Patient goals were partially met. Patient is being discharged due to being pleased with the current functional level.  ?????    Pt reports that she is changing insurances and will no longer have coverage for therapy. She feels that she has the tools to continue with her rehab process at home. She will call with any questions or problems.   Adasha Boehme P. Helene Kelp PT, MPH 01/04/19 4:17 PM

## 2019-01-06 ENCOUNTER — Encounter: Payer: BC Managed Care – PPO | Admitting: Physical Therapy

## 2019-01-13 ENCOUNTER — Telehealth: Payer: Self-pay | Admitting: Physician Assistant

## 2019-01-13 ENCOUNTER — Other Ambulatory Visit: Payer: Self-pay

## 2019-01-13 ENCOUNTER — Other Ambulatory Visit: Payer: Medicaid Other

## 2019-01-13 DIAGNOSIS — E119 Type 2 diabetes mellitus without complications: Secondary | ICD-10-CM

## 2019-01-13 LAB — BAYER DCA HB A1C WAIVED: HB A1C (BAYER DCA - WAIVED): >14 % — ABNORMAL HIGH (ref <7.0)

## 2019-01-13 NOTE — Telephone Encounter (Signed)
Patient aware.

## 2019-01-13 NOTE — Telephone Encounter (Signed)
  Follow up plan: Return in about 4 weeks (around 12/14/2018) for recheck medications.  That was the information on the 11/16/18 note.  Will have to be seen for refills on controlled medication

## 2019-01-14 LAB — TSH: TSH: 4.87 u[IU]/mL — ABNORMAL HIGH (ref 0.450–4.500)

## 2019-01-16 ENCOUNTER — Other Ambulatory Visit: Payer: Self-pay

## 2019-01-17 ENCOUNTER — Ambulatory Visit (INDEPENDENT_AMBULATORY_CARE_PROVIDER_SITE_OTHER): Payer: Medicaid Other | Admitting: Physician Assistant

## 2019-01-17 ENCOUNTER — Encounter: Payer: Self-pay | Admitting: Physician Assistant

## 2019-01-17 ENCOUNTER — Other Ambulatory Visit: Payer: Self-pay | Admitting: Family Medicine

## 2019-01-17 VITALS — BP 135/90 | HR 114 | Temp 97.1°F | Ht 68.0 in | Wt 234.8 lb

## 2019-01-17 DIAGNOSIS — B3731 Acute candidiasis of vulva and vagina: Secondary | ICD-10-CM

## 2019-01-17 DIAGNOSIS — B373 Candidiasis of vulva and vagina: Secondary | ICD-10-CM

## 2019-01-17 DIAGNOSIS — M1712 Unilateral primary osteoarthritis, left knee: Secondary | ICD-10-CM | POA: Diagnosis not present

## 2019-01-17 DIAGNOSIS — Z96651 Presence of right artificial knee joint: Secondary | ICD-10-CM | POA: Diagnosis not present

## 2019-01-17 DIAGNOSIS — E109 Type 1 diabetes mellitus without complications: Secondary | ICD-10-CM | POA: Diagnosis not present

## 2019-01-17 DIAGNOSIS — M1711 Unilateral primary osteoarthritis, right knee: Secondary | ICD-10-CM

## 2019-01-17 DIAGNOSIS — Z96652 Presence of left artificial knee joint: Secondary | ICD-10-CM

## 2019-01-17 DIAGNOSIS — F909 Attention-deficit hyperactivity disorder, unspecified type: Secondary | ICD-10-CM

## 2019-01-17 MED ORDER — INSULIN PEN NEEDLE 31G X 5 MM MISC: 1.0000 [IU] | Freq: Every day | 5 refills | Status: DC

## 2019-01-17 MED ORDER — SITAGLIPTIN PHOSPHATE 50 MG PO TABS: 50.0000 mg | ORAL_TABLET | Freq: Every day | ORAL | 5 refills | Status: DC

## 2019-01-17 MED ORDER — FLUCONAZOLE 150 MG PO TABS: ORAL_TABLET | ORAL | 2 refills | Status: DC

## 2019-01-17 MED ORDER — LANTUS SOLOSTAR 100 UNIT/ML ~~LOC~~ SOPN: 20.0000 [IU] | PEN_INJECTOR | Freq: Every day | SUBCUTANEOUS | 99 refills | Status: DC

## 2019-01-17 MED ORDER — BLOOD GLUCOSE METER KIT: PACK | 0 refills | Status: DC

## 2019-01-18 ENCOUNTER — Telehealth

## 2019-01-18 ENCOUNTER — Telehealth: Payer: Self-pay | Admitting: *Deleted

## 2019-01-18 ENCOUNTER — Telehealth: Payer: Self-pay | Admitting: Physician Assistant

## 2019-01-18 ENCOUNTER — Other Ambulatory Visit: Payer: Self-pay | Admitting: Physician Assistant

## 2019-01-18 ENCOUNTER — Encounter: Payer: Self-pay | Admitting: Physician Assistant

## 2019-01-18 DIAGNOSIS — F988 Other specified behavioral and emotional disorders with onset usually occurring in childhood and adolescence: Secondary | ICD-10-CM

## 2019-01-18 MED ORDER — LISDEXAMFETAMINE DIMESYLATE 50 MG PO CAPS: 50.0000 mg | ORAL_CAPSULE | Freq: Every day | ORAL | 0 refills | Status: DC

## 2019-01-18 NOTE — Telephone Encounter (Signed)
It was sent. Please make sure her next appt is 30 minutes. She is dealing with disability and multiple chronic conditions

## 2019-01-18 NOTE — Telephone Encounter (Signed)
Patient needs an order to have labs drawn in order to complete her wellness form for her employment insurance company - form can be completed after this has been done and completed  Haskell Flirt, LPN  0/08/1779  6:41 AM

## 2019-01-18 NOTE — Telephone Encounter (Signed)
OK labs

## 2019-01-18 NOTE — Progress Notes (Signed)
BP 135/90   Pulse (!) 114   Temp (!) 97.1 F (36.2 C) (Temporal)   Ht _0  (1.727 m)   Wt 234 lb 12.8 oz (106.5 kg)   BMI 35.70 kg/m    Subjective:    Patient ID: Judith [name], female    DOB: November 10, 1970, 48 y.o.   MRN: [id number]  HPI: Judith [name] is a 48 y.o. female presenting on 01/17/2019 for Medical Management of Chronic Issues and Medication Refill  This patient comes in for a follow-up on her multiple chronic medical conditions.  She is still under her disability leave related to her osteoarthritis of her knees.  She was out through February 15, 2019 at our last note.  She did do home physical therapy and then she was able to go to physical therapy at the outpatient clinic.  They were able to get her mostly to the full amount of range of motion.  However when he is still not completely there.  The right knee is still giving her the most trouble.  She did try to go back to work but had swelling of the knees.  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  Her job includes standing on concrete floor, walking, moving merchandise.  Because of her company, an orthopedic program was available to her.  She went out of state to have her surgeries performed.  Therefore there is no local orthopedist here.  She was given the name of a provider who did participate with the insurance program this person was in [place].  She is to let us know who this is and we will try to make an appointment with them for orthopedic evaluation and their final evaluation on the status of her knees.  However at this time the patient is on Medicaid because of being out of work.  So she is working on long-term disability.  A note can be sent to her [place] taking her out through April 02, 2019.  The patient is having severe uncontrolled diabetes.  Her hemoglobin A1c is greater than 14.  She has lost 16 pounds since  her last visit.  However I think it is due to muscle wasting secondary to her hyperglycemia.  She does feel tired and has little energy.  She had tried metformin in the past and failed.  She is currently on Actos 15 mg.  We have discussed the treatment of increasing her Actos to 30 mg, adding Januvia and starting with a basal insulin.  She is given instructions on all of these things.  We will plan for her to return in 1 month for a follow-up.  She is to keep a log of her diabetes readings.  The patient does need refills on her medication including ADHD medicine.   Past Medical History:  Diagnosis Date  . Asthma   . Bipolar 1 disorder ([place])   . Depression   . GERD (gastroesophageal reflux disease)   . Hyperlipemia   . Hypothyroid   . Migraines    Relevant past medical, surgical, family and social history reviewed and updated as indicated. Interim medical history since our last visit reviewed. Allergies and medications reviewed and updated. DATA REVIEWED: CHART IN EPIC  Family History reviewed for pertinent findings.  Review of Systems  Constitutional: Positive for fatigue. Negative for activity change and fever.  HENT: Negative.  Eyes: Negative.   Respiratory: Negative.  Negative for cough.   Cardiovascular: Negative.  Negative for chest pain.  Gastrointestinal: Negative.  Negative for abdominal pain.  Endocrine: Negative.   Genitourinary: Negative.  Negative for dysuria.  Musculoskeletal: Positive for arthralgias and joint swelling.  Skin: Negative.   Neurological: Negative.     Allergies as of 01/17/2019      Reactions   Diphenhydramine Other (See Comments)   Hair feels like it is crawling   Morphine Nausea And Vomiting   PCA PUMP N/V IV PUSH IN ER NO PROBLEM PER PT.   Prednisone Nausea And Vomiting   Erythromycin Hives   Sulfonamide Derivatives Other (See Comments)   Does not take due to family history      Medication List       Accurate as of January 17, 2019  11:59 PM. If you have any questions, ask your nurse or doctor.        albuterol (2.5 MG/3ML) 0.083% nebulizer solution Commonly known as: PROVENTIL Nebulize 1 vial three times daily   aspirin EC 81 MG tablet Take 162 mg by mouth daily.   atorvastatin 10 MG tablet Commonly known as: LIPITOR Take 1 tablet (10 mg total) by mouth daily.   blood glucose meter kit and supplies Dispense based on patient and insurance preference. Check blood sugar bid and as needed. Dx: E11.9 Started by: Terald Sleeper, PA-C   busPIRone 15 MG tablet Commonly known as: BUSPAR Take 15 mg by mouth 3 (three) times daily.   cetirizine 10 MG tablet Commonly known as: ZYRTEC Take 1 tablet (10 mg total) by mouth daily.   diclofenac sodium 1 % Gel Commonly known as: VOLTAREN Apply 4 g topically 4 (four) times daily.   dicyclomine 20 MG tablet Commonly known as: BENTYL Take 1 tablet (20 mg total) by mouth every 6 (six) hours.   fluconazole 150 MG tablet Commonly known as: Diflucan 1 po q week x 4 weeks   hydrOXYzine 25 MG tablet Commonly known as: ATARAX/VISTARIL Take 1 tablet (25 mg total) by mouth 3 (three) times daily.   ibuprofen 800 MG tablet Commonly known as: ADVIL TAKE 1 TABLET BY MOUTH EVERY 8 HOURS AS NEEDED   Insulin Pen Needle 31G X 5 MM Misc 1 Units by Does not apply route daily. Started by: Terald Sleeper, PA-C   lamoTRIgine 150 MG tablet Commonly known as: LAMICTAL Take 1 tablet (150 mg total) by mouth 2 (two) times daily.   Lantus SoloStar 100 UNIT/ML Solostar Pen Generic drug: Insulin Glargine Inject 20-50 Units into the skin daily. Started by: Terald Sleeper, PA-C   levothyroxine 125 MCG tablet Commonly known as: SYNTHROID TAKE ONE TAB BY MOUTH DAILY   lisdexamfetamine 50 MG capsule Commonly known as: Vyvanse Take 1 capsule (50 mg total) by mouth daily.   lisinopril 20 MG tablet Commonly known as: ZESTRIL Take 1 tablet (20 mg total) by mouth daily.   nystatin  100000 UNIT/ML suspension Commonly known as: MYCOSTATIN Take 5 mLs (500,000 Units total) by mouth 4 (four) times daily.   pantoprazole 40 MG tablet Commonly known as: PROTONIX TAKE ONE TAB BY MOUTH 2 TIMES DAILY   pioglitazone 15 MG tablet Commonly known as: ACTOS Take 1 tablet (15 mg total) by mouth daily.   sertraline 100 MG tablet Commonly known as: ZOLOFT Take 2 tablets (200 mg total) by mouth daily.   sitaGLIPtin 50 MG tablet Commonly known as: Januvia Take 1 tablet (50 mg  total) by mouth daily. Started by: Terald Sleeper, PA-C   Symbicort 160-4.5 MCG/ACT inhaler Generic drug: budesonide-formoterol TAKE 2 PUFFS BY MOUTH TWICE A DAY   traMADol 50 MG tablet Commonly known as: ULTRAM Take 1 tablet (50 mg total) by mouth every 8 (eight) hours as needed for severe pain.          Objective:    BP 135/90   Pulse (!) 114   Temp (!) 97.1 F (36.2 C) (Temporal)   Ht '5\' 8"'$  (1.727 m)   Wt 234 lb 12.8 oz (106.5 kg)   BMI 35.70 kg/m   Allergies  Allergen Reactions  . Diphenhydramine Other (See Comments)    Hair feels like it is crawling  . Morphine Nausea And Vomiting    PCA PUMP N/V IV PUSH IN ER NO PROBLEM PER PT.  . Prednisone Nausea And Vomiting  . Erythromycin Hives  . Sulfonamide Derivatives Other (See Comments)    Does not take due to family history    Wt Readings from Last 3 Encounters:  01/17/19 234 lb 12.8 oz (106.5 kg)  11/16/18 250 lb 6.4 oz (113.6 kg)  06/15/18 255 lb (115.7 kg)    Physical Exam Constitutional:      General: She is not in acute distress.    Appearance: Normal appearance. She is well-developed.  HENT:     Head: Normocephalic and atraumatic.  Cardiovascular:     Rate and Rhythm: Normal rate.  Pulmonary:     Effort: Pulmonary effort is normal.  Musculoskeletal:     Right knee: She exhibits decreased range of motion and swelling. Tenderness found.     Left knee: She exhibits decreased range of motion and swelling. Tenderness  found.       Legs:  Skin:    General: Skin is warm and dry.     Findings: No rash.  Neurological:     Mental Status: She is alert and oriented to person, place, and time.     Deep Tendon Reflexes: Reflexes are normal and symmetric.     Results for orders placed or performed in visit on 01/13/19  TSH  Result Value Ref Range   TSH 4.870 (H) 0.450 - 4.500 uIU/mL  Bayer DCA Hb A1c Waived  Result Value Ref Range   HB A1C (BAYER DCA - WAIVED) >14.0 (H) <7.0 %      Assessment & Plan:   1. Type 1 diabetes mellitus without complication (HCC) - Insulin Glargine (LANTUS SOLOSTAR) 100 UNIT/ML Solostar Pen; Inject 20-50 Units into the skin daily.  Dispense: 5 pen; Refill: PRN - sitaGLIPtin (JANUVIA) 50 MG tablet; Take 1 tablet (50 mg total) by mouth daily.  Dispense: 30 tablet; Refill: 5 - Insulin Pen Needle 31G X 5 MM MISC; 1 Units by Does not apply route daily.  Dispense: 100 each; Refill: 5 - blood glucFollow with orthopedics Continue out of work through 11/152020  continue other [employer] and supplies; Dispense based on patient and insurance preference. Check blood sugar bid and as needed. Dx: E11.9  Dispense: 1 each; Refill: 0  2. Candidal vaginitis - fluconazole (DIFLUCAN) 150 MG tablet; 1 po q week x 4 weeks  Dispense: 4 tablet; Refill: 2  3. Primary osteoarthritis of left knee Follow with orthopedics Continue out of work through 11/152020  continue other medications  4. Status post right knee replacement Follow with orthopedics Continue out of work through 11/152020  continue other medications  5. History of left knee replacement  Follow with orthopedics Continue out of work through 11/152020  continue other medications  6. Primary osteoarthritis of right knee Follow with orthopedics Continue out of work through 11/152020  continue other medications  7. Morbid obesity ([place]) Follow with orthopedics Continue out of work through 11/152020  continue other  medications  8. Attention deficit hyperactivity disorder (ADHD), unspecified ADHD type Continue medications  Out of work note needs to be sent to [facility] for her to be out 6/15 through 04/02/2019.  Continue all other maintenance medications as listed above.  Follow up plan: Return in about 1 month (around 02/16/2019).  Educational handout given for carb counting  Terald Sleeper PA-C [facility] [address]  [place], [place] [address] [phone]   01/18/2019, 5:31 PM

## 2019-01-18 NOTE — Telephone Encounter (Signed)
Januvia non-preferred by Medicaid  Pt must try and fail Metformin or Metformin containing products unless contraindicated or documented adverse reaction

## 2019-01-18 NOTE — Telephone Encounter (Signed)
Failed metformin and actos

## 2019-01-18 NOTE — Telephone Encounter (Signed)
What is the name of the medication? vyvanse 70 mg  Have you contacted your pharmacy to request a refill? No pt had apt yesterday forgot to mention  Which pharmacy would you like this sent to? [facility]   Patient notified that their request is being sent to the clinical staff for review and that they should receive a call once it is complete. If they do not receive a call within 24 hours they can check with their pharmacy or our office.

## 2019-01-18 NOTE — Telephone Encounter (Signed)
Prior Auth for Tramadol 50mg  HCL tab-In Process  Confirmation number [id number]  [employer]

## 2019-01-18 NOTE — Telephone Encounter (Signed)
Please review and advise.

## 2019-01-19 LAB — TSH 3RD GENERATION
TSH: 1.28 u[IU]/mL (ref 0.450–4.500)
TSH: 1.28 u[IU]/mL (ref 0.450–4.500)

## 2019-01-19 LAB — METABOLIC PANEL, COMPREHENSIVE
A-G Ratio: 2.2 (ref 1.2–2.2)
ALT (SGPT): 42 IU/L — ABNORMAL HIGH (ref 0–32)
AST (SGOT): 24 IU/L (ref 0–40)
Albumin: 4.8 g/dL (ref 3.8–4.8)
Alk. phosphatase: 127 IU/L — ABNORMAL HIGH (ref 39–117)
BUN/Creatinine ratio: 28 — ABNORMAL HIGH (ref 9–23)
BUN: 20 mg/dL (ref 6–24)
Bilirubin, total: 0.6 mg/dL (ref 0.0–1.2)
CO2: 25 mmol/L (ref 20–29)
Calcium: 9.2 mg/dL (ref 8.7–10.2)
Chloride: 101 mmol/L (ref 96–106)
Creatinine: 0.71 mg/dL (ref 0.57–1.00)
GFR est AA: 116 mL/min/{1.73_m2} (ref >59)
GFR est non-AA: 100 mL/min/{1.73_m2} (ref >59)
GLOBULIN, TOTAL: 2.2 g/dL (ref 1.5–4.5)
Glucose: 79 mg/dL (ref 65–99)
Potassium: 4 mmol/L (ref 3.5–5.2)
Protein, total: 7 g/dL (ref 6.0–8.5)
Sodium: 142 mmol/L (ref 134–144)

## 2019-01-19 LAB — SPECIMEN STATUS REPORT

## 2019-01-19 LAB — HEMOGLOBIN A1C WITH EAG
Estimated average glucose: 103 mg/dL
Hemoglobin A1c: 5.2 % (ref 4.8–5.6)

## 2019-01-19 LAB — LIPID PANEL
Cholesterol, Total: 225 mg/dL — ABNORMAL HIGH (ref 100–199)
Cholesterol, total: 225 mg/dL — ABNORMAL HIGH (ref 100–199)
HDL Cholesterol: 71 mg/dL (ref >39)
HDL: 71 mg/dL (ref >39)
LDL Calculated: 144 mg/dL — ABNORMAL HIGH (ref 0–99)
LDL, calculated: 144 mg/dL — ABNORMAL HIGH (ref 0–99)
Triglyceride: 58 mg/dL (ref 0–149)
Triglycerides: 58 mg/dL (ref 0–149)
VLDL, calculated: 10 mg/dL (ref 5–40)
VLDL: 10 mg/dL (ref 5–40)

## 2019-01-19 LAB — CVD REPORT

## 2019-01-19 LAB — COMPREHENSIVE METABOLIC PANEL
ALT: 42 IU/L — ABNORMAL HIGH (ref 0–32)
AST: 24 IU/L (ref 0–40)
Albumin/Globulin Ratio: 2.2 NA (ref 1.2–2.2)
Albumin: 4.8 g/dL (ref 3.8–4.8)
Alkaline Phosphatase: 127 IU/L — ABNORMAL HIGH (ref 39–117)
BUN: 20 mg/dL (ref 6–24)
Bun/Cre Ratio: 28 NA — ABNORMAL HIGH (ref 9–23)
CO2: 25 mmol/L (ref 20–29)
Calcium: 9.2 mg/dL (ref 8.7–10.2)
Chloride: 101 mmol/L (ref 96–106)
Creatinine: 0.71 mg/dL (ref 0.57–1.00)
EGFR IF NonAfrican American: 100 mL/min/{1.73_m2} (ref >59)
GFR African American: 116 mL/min/{1.73_m2} (ref >59)
Globulin, Total: 2.2 g/dL (ref 1.5–4.5)
Glucose: 79 mg/dL (ref 65–99)
Potassium: 4 mmol/L (ref 3.5–5.2)
Sodium: 142 mmol/L (ref 134–144)
Total Bilirubin: 0.6 mg/dL (ref 0.0–1.2)
Total Protein: 7 g/dL (ref 6.0–8.5)

## 2019-01-19 LAB — HEMOGLOBIN A1C W/EAG
Hemoglobin A1C: 5.2 % (ref 4.8–5.6)
eAG: 103 mg/dL

## 2019-01-19 NOTE — Telephone Encounter (Addendum)
Prior Auth for Januvia 50mg -APPROVED 01/19/2019-01/19/2020   Confirmation# [id number]  [employer]

## 2019-01-19 NOTE — Telephone Encounter (Signed)
Patient aware, next appointment changed to 30 minutes.

## 2019-01-19 NOTE — Telephone Encounter (Signed)
Prior Auth for Tramadol HCL tab-APPROVED 01/18/2019 through 07/17/2019  PA# [id number]  NCT[employer]

## 2019-01-30 ENCOUNTER — Ambulatory Visit: Payer: Medicaid Other | Admitting: Physician Assistant

## 2019-02-03 ENCOUNTER — Other Ambulatory Visit: Payer: Self-pay | Admitting: Physician Assistant

## 2019-02-13 ENCOUNTER — Other Ambulatory Visit: Payer: Self-pay | Admitting: Physician Assistant

## 2019-02-13 MED ORDER — PIOGLITAZONE HCL 30 MG PO TABS: 30.0000 mg | ORAL_TABLET | Freq: Every day | ORAL | 1 refills | Status: DC

## 2019-02-13 NOTE — Telephone Encounter (Signed)
Patient was seem 9/1 and per note her Actos was getting increased to 30mg .  New rx was sent to pharmacy as patient requested.

## 2019-02-17 ENCOUNTER — Other Ambulatory Visit: Payer: Self-pay | Admitting: Physician Assistant

## 2019-02-17 ENCOUNTER — Other Ambulatory Visit: Payer: Self-pay

## 2019-02-20 ENCOUNTER — Encounter: Payer: Self-pay | Admitting: Physician Assistant

## 2019-02-20 ENCOUNTER — Other Ambulatory Visit: Payer: Self-pay | Admitting: Physician Assistant

## 2019-02-20 ENCOUNTER — Ambulatory Visit (INDEPENDENT_AMBULATORY_CARE_PROVIDER_SITE_OTHER): Payer: Medicaid Other | Admitting: Physician Assistant

## 2019-02-20 ENCOUNTER — Other Ambulatory Visit: Payer: Self-pay

## 2019-02-20 VITALS — BP 130/83 | HR 82 | Temp 98.2°F | Ht 68.0 in | Wt 246.0 lb

## 2019-02-20 DIAGNOSIS — Z96652 Presence of left artificial knee joint: Secondary | ICD-10-CM | POA: Diagnosis not present

## 2019-02-20 DIAGNOSIS — M1712 Unilateral primary osteoarthritis, left knee: Secondary | ICD-10-CM

## 2019-02-20 DIAGNOSIS — E785 Hyperlipidemia, unspecified: Secondary | ICD-10-CM

## 2019-02-20 DIAGNOSIS — E119 Type 2 diabetes mellitus without complications: Secondary | ICD-10-CM

## 2019-02-20 DIAGNOSIS — Z23 Encounter for immunization: Secondary | ICD-10-CM | POA: Diagnosis not present

## 2019-02-20 DIAGNOSIS — F39 Unspecified mood [affective] disorder: Secondary | ICD-10-CM

## 2019-02-20 DIAGNOSIS — F988 Other specified behavioral and emotional disorders with onset usually occurring in childhood and adolescence: Secondary | ICD-10-CM

## 2019-02-20 DIAGNOSIS — M1711 Unilateral primary osteoarthritis, right knee: Secondary | ICD-10-CM

## 2019-02-20 DIAGNOSIS — E039 Hypothyroidism, unspecified: Secondary | ICD-10-CM

## 2019-02-20 DIAGNOSIS — Z96651 Presence of right artificial knee joint: Secondary | ICD-10-CM

## 2019-02-20 MED ORDER — LISDEXAMFETAMINE DIMESYLATE 70 MG PO CAPS: 70.0000 mg | ORAL_CAPSULE | Freq: Every day | ORAL | 0 refills | Status: DC

## 2019-02-20 NOTE — Progress Notes (Deleted)
BP 135/90   Pulse (!) 114   Temp (!) 97.1 F (36.2 C) (Temporal)   Ht '5\' 8"'$  (1.727 m)   Wt 234 lb 12.8 oz (106.5 kg)   BMI 35.70 kg/m    Subjective:    Patient ID: Judith [name], female    DOB: [birth year], 48 y.o.   MRN: [id number]  HPI: Judith [name] is a 48 y.o. female presenting on 01/17/2019 for Medical Management of Chronic Issues and Medication Refill  This patient comes in for a follow-up on her multiple chronic medical conditions.  She is still under her disability leave related to her osteoarthritis of her knees.  She was out through Apr 02, 2019 at our last note.    She did do home physical therapy and then she was able to go to physical therapy at the outpatient clinic.  They were able to get her mostly to the full amount of range of motion.  However when he is still not completely there.  The right knee is still giving her the most trouble.  She did try to go back to work but had swelling of the knees.  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  Her job includes standing on concrete floor, walking, moving merchandise.  Because of her company, an orthopedic program was available to her.  She went out of state to have her surgeries performed.  Therefore there is no local orthopedist here.    She was given the name of a provider who did participate with the insurance program this person was in [place].  She is to let us know who this is and we will try to make an appointment with them for orthopedic evaluation and their final evaluation on the status of her knees.    However at this time the patient is on Medicaid because of being out of work.  So she is working on long-term disability.  A note can be sent to her [place] taking her out through April 02, 2019.  The patient is having severe uncontrolled diabetes.  Her hemoglobin A1c is greater than 14.  She has lost 16 pounds  since her last visit.  However I think it is due to muscle wasting secondary to her hyperglycemia.  She does feel tired and has little energy.  She had tried metformin in the past and failed.  She is currently on Actos 15 mg.  We have discussed the treatment of increasing her Actos to 30 mg, adding Januvia and starting with a basal insulin.  She is given instructions on all of these things.  We will plan for her to return in 1 month for a follow-up.  She is to keep a log of her diabetes readings.  The patient does need refills on her medication including ADHD medicine.   Past Medical History:  Diagnosis Date  . Asthma   . Bipolar 1 disorder ([place])   . Depression   . GERD (gastroesophageal reflux disease)   . Hyperlipemia   . Hypothyroid   . Migraines    Relevant past medical, surgical, family and social history reviewed and updated as indicated. Interim medical history since our last visit reviewed. Allergies and medications reviewed and updated. DATA REVIEWED: CHART IN EPIC  Family History reviewed for pertinent findings.  Review of Systems  Constitutional: Positive for fatigue. Negative for activity change and  fever.  HENT: Negative.   Eyes: Negative.   Respiratory: Negative.  Negative for cough.   Cardiovascular: Negative.  Negative for chest pain.  Gastrointestinal: Negative.  Negative for abdominal pain.  Endocrine: Negative.   Genitourinary: Negative.  Negative for dysuria.  Musculoskeletal: Positive for arthralgias and joint swelling.  Skin: Negative.   Neurological: Negative.     Allergies as of 01/17/2019      Reactions   Diphenhydramine Other (See Comments)   Hair feels like it is crawling   Morphine Nausea And Vomiting   PCA PUMP N/V IV PUSH IN ER NO PROBLEM PER PT.   Prednisone Nausea And Vomiting   Erythromycin Hives   Sulfonamide Derivatives Other (See Comments)   Does not take due to family history      Medication List       Accurate as of January 17, 2019 11:59 PM. If you have any questions, ask your nurse or doctor.        albuterol (2.5 MG/3ML) 0.083% nebulizer solution Commonly known as: PROVENTIL Nebulize 1 vial three times daily   aspirin EC 81 MG tablet Take 162 mg by mouth daily.   atorvastatin 10 MG tablet Commonly known as: LIPITOR Take 1 tablet (10 mg total) by mouth daily.   blood glucose meter kit and supplies Dispense based on patient and insurance preference. Check blood sugar bid and as needed. Dx: E11.9 Started by: Terald Sleeper, PA-C   busPIRone 15 MG tablet Commonly known as: BUSPAR Take 15 mg by mouth 3 (three) times daily.   cetirizine 10 MG tablet Commonly known as: ZYRTEC Take 1 tablet (10 mg total) by mouth daily.   diclofenac sodium 1 % Gel Commonly known as: VOLTAREN Apply 4 g topically 4 (four) times daily.   dicyclomine 20 MG tablet Commonly known as: BENTYL Take 1 tablet (20 mg total) by mouth every 6 (six) hours.   fluconazole 150 MG tablet Commonly known as: Diflucan 1 po q week x 4 weeks   hydrOXYzine 25 MG tablet Commonly known as: ATARAX/VISTARIL Take 1 tablet (25 mg total) by mouth 3 (three) times daily.   ibuprofen 800 MG tablet Commonly known as: ADVIL TAKE 1 TABLET BY MOUTH EVERY 8 HOURS AS NEEDED   Insulin Pen Needle 31G X 5 MM Misc 1 Units by Does not apply route daily. Started by: Terald Sleeper, PA-C   lamoTRIgine 150 MG tablet Commonly known as: LAMICTAL Take 1 tablet (150 mg total) by mouth 2 (two) times daily.   Lantus SoloStar 100 UNIT/ML Solostar Pen Generic drug: Insulin Glargine Inject 20-50 Units into the skin daily. Started by: Terald Sleeper, PA-C   levothyroxine 125 MCG tablet Commonly known as: SYNTHROID TAKE ONE TAB BY MOUTH DAILY   lisdexamfetamine 50 MG capsule Commonly known as: Vyvanse Take 1 capsule (50 mg total) by mouth daily.   lisinopril 20 MG tablet Commonly known as: ZESTRIL Take 1 tablet (20 mg total) by mouth daily.   nystatin  100000 UNIT/ML suspension Commonly known as: MYCOSTATIN Take 5 mLs (500,000 Units total) by mouth 4 (four) times daily.   pantoprazole 40 MG tablet Commonly known as: PROTONIX TAKE ONE TAB BY MOUTH 2 TIMES DAILY   pioglitazone 15 MG tablet Commonly known as: ACTOS Take 1 tablet (15 mg total) by mouth daily.   sertraline 100 MG tablet Commonly known as: ZOLOFT Take 2 tablets (200 mg total) by mouth daily.   sitaGLIPtin 50 MG tablet Commonly known as:  Januvia Take 1 tablet (50 mg total) by mouth daily. Started by: Terald Sleeper, PA-C   Symbicort 160-4.5 MCG/ACT inhaler Generic drug: budesonide-formoterol TAKE 2 PUFFS BY MOUTH TWICE A DAY   traMADol 50 MG tablet Commonly known as: ULTRAM Take 1 tablet (50 mg total) by mouth every 8 (eight) hours as needed for severe pain.          Objective:     BP 130/83   Pulse 82   Temp 98.2 F (36.8 C) (Temporal)   Ht '5\' 8"'$  (1.727 m)   Wt 246 lb (111.6 kg)   SpO2 99%   BMI 37.40 kg/m   Allergies  Allergen Reactions  . Diphenhydramine Other (See Comments)    Hair feels like it is crawling  . Morphine Nausea And Vomiting    PCA PUMP N/V IV PUSH IN ER NO PROBLEM PER PT.  . Prednisone Nausea And Vomiting  . Erythromycin Hives  . Sulfonamide Derivatives Other (See Comments)    Does not take due to family history    Wt Readings from Last 3 Encounters:  01/17/19 234 lb 12.8 oz (106.5 kg)  11/16/18 250 lb 6.4 oz (113.6 kg)  06/15/18 255 lb (115.7 kg)       Results for orders placed or performed in visit on 01/13/19  TSH  Result Value Ref Range   TSH 4.870 (H) 0.450 - 4.500 uIU/mL  Bayer DCA Hb A1c Waived  Result Value Ref Range   HB A1C (BAYER DCA - WAIVED) >14.0 (H) <7.0 %      Assessment & Plan:   1. Type 1 diabetes mellitus without complication (HCC) - Insulin Glargine (LANTUS SOLOSTAR) 100 UNIT/ML Solostar Pen; Inject 20-50 Units into the skin daily.  Dispense: 5 pen; Refill: PRN - sitaGLIPtin (JANUVIA)  50 MG tablet; Take 1 tablet (50 mg total) by mouth daily.  Dispense: 30 tablet; Refill: 5 - Insulin Pen Needle 31G X 5 MM MISC; 1 Units by Does not apply route daily.  Dispense: 100 each; Refill: 5 - blood glucFollow with orthopedics Continue out of work through 11/152020  continue other [employer] and supplies; Dispense based on patient and insurance preference. Check blood sugar bid and as needed. Dx: E11.9  Dispense: 1 each; Refill: 0    3. Primary osteoarthritis of left knee Follow with orthopedics Continue out of work through 11/152020  continue other medications  4. Status post right knee replacement Follow with orthopedics Continue out of work through 11/152020  continue other medications  5. History of left knee replacement Follow with orthopedics Continue out of work through 11/152020  continue other medications  6. Primary osteoarthritis of right knee Follow with orthopedics Continue out of work through 11/152020  continue other medications    8. Attention deficit hyperactivity disorder (ADHD), unspecified ADHD type Continue medications  Out of work note needs to be sent to [facility] for her to be out 6/15 through 04/02/2019.  Continue all other maintenance medications as listed above.

## 2019-02-20 NOTE — Progress Notes (Addendum)
BP 130/83   Pulse 82   Temp 98.2 F (36.8 C) (Temporal)   Ht '5\' 8"'$  (1.727 m)   Wt 246 lb (111.6 kg)   SpO2 99%   BMI 37.40 kg/m    Subjective:    Patient ID: Judith [name], female    DOB: 02-May-1971, 48 y.o.   MRN: [id number]  HPI: Judith [name] is a 48 y.o. female presenting on 02/20/2019 for Diabetes and Knee Pain  This patient comes in for a follow-up on her multiple chronic medical conditions.  She is still under her disability leave related to her osteoarthritis of her knees.  She was out through Apr 02, 2019 at our last note.  At this time she has been in seeing her orthopedist at [facility] in [place].  Is Dr. Terrial Rhodes the third.  His phone number is [phone].  We will have her get records from his office.  He reports that it can sometimes take a year for a joint to completely heal.  In February it will be 1 year for her right knee.  And it will be 1-1/2-year for the left at that time.  She has completed her physical therapy and now is just doing home exercises and strengthening.  We will continue for her to be out at this time due to the fact that it is still hurting significantly and swelling when she is up on it for very long.    Her job includes standing on concrete floor, walking, moving merchandise.  Because of her company, an orthopedic program was available to her.  She went out of state to have her surgeries performed.  Therefore there is no local orthopedist here.       However at this time the patient is on Medicaid because of being out of work.  So she is working on long-term disability.  A note can be sent to her [place] taking her out through April 02, 2019.  The patient was having severe uncontrolled diabetes at her last visit.  The A1c had gone up to over 14.  At this point she is up to 45 units of Lantus.  Her fasting blood sugars are generally very close to 100.  And her postprandials are being of 114 or under.  It did take  her couple weeks to get the Januvia.  We will wait and check an A1c at the next visit.  The patient does need refills on her medication including ADHD medicine.  Currently taking Vyvanse 50 mg.  And we had planned to build it up.  So we will go ahead and go to 70 mg now.  Past Medical History:  Diagnosis Date  . Asthma   . Bipolar 1 disorder ([place])   . Depression   . GERD (gastroesophageal reflux disease)   . Hyperlipemia   . Hypothyroid   . Migraines    Relevant past medical, surgical, family and social history reviewed and updated as indicated. Interim medical history since our last visit reviewed. Allergies and medications reviewed and updated. DATA REVIEWED: CHART IN EPIC  Family History reviewed for pertinent findings.  Review of Systems  Allergies as of 02/20/2019      Reactions   Diphenhydramine Other (See Comments)   Hair feels like it is crawling   Morphine Nausea And Vomiting   PCA PUMP N/V IV PUSH IN ER NO PROBLEM PER PT.   Prednisone Nausea And Vomiting   Erythromycin Hives   Sulfonamide  Derivatives Other (See Comments)   Does not take due to family history      Medication List       Accurate as of February 20, 2019  9:14 PM. If you have any questions, ask your nurse or doctor.        Accu-Chek FastClix Lancets Misc Test blood sugars twice daily Dx E11.9   Accu-Chek Guide test strip Generic drug: glucose blood CHECK BLOOD SUGARS TWICE A DAY AND AS DIRECTED   albuterol (2.5 MG/3ML) 0.083% nebulizer solution Commonly known as: PROVENTIL Nebulize 1 vial three times daily   aspirin EC 81 MG tablet Take 162 mg by mouth daily.   atorvastatin 10 MG tablet Commonly known as: LIPITOR Take 1 tablet (10 mg total) by mouth daily.   blood glucose meter kit and supplies Dispense based on patient and insurance preference. Check blood sugar bid and as needed. Dx: E11.9   busPIRone 15 MG tablet Commonly known as: BUSPAR Take 15 mg by mouth 3 (three) times  daily.   cetirizine 10 MG tablet Commonly known as: ZYRTEC Take 1 tablet (10 mg total) by mouth daily.   diclofenac sodium 1 % Gel Commonly known as: VOLTAREN Apply 4 g topically 4 (four) times daily.   dicyclomine 20 MG tablet Commonly known as: BENTYL Take 1 tablet (20 mg total) by mouth every 6 (six) hours.   fluconazole 150 MG tablet Commonly known as: Diflucan 1 po q week x 4 weeks   hydrOXYzine 25 MG tablet Commonly known as: ATARAX/VISTARIL Take 1 tablet (25 mg total) by mouth 3 (three) times daily.   ibuprofen 800 MG tablet Commonly known as: ADVIL TAKE 1 TABLET BY MOUTH EVERY 8 HOURS AS NEEDED   Insulin Pen Needle 31G X 5 MM Misc 1 Units by Does not apply route daily.   lamoTRIgine 150 MG tablet Commonly known as: LAMICTAL Take 1 tablet (150 mg total) by mouth 2 (two) times daily.   Lantus SoloStar 100 UNIT/ML Solostar Pen Generic drug: Insulin Glargine Inject 20-50 Units into the skin daily.   levothyroxine 125 MCG tablet Commonly known as: SYNTHROID TAKE ONE TAB BY MOUTH DAILY   lisdexamfetamine 70 MG capsule Commonly known as: Vyvanse Take 1 capsule (70 mg total) by mouth daily. What changed:   medication strength  how much to take Changed by: Terald Sleeper, PA-C   lisdexamfetamine 70 MG capsule Commonly known as: Vyvanse Take 1 capsule (70 mg total) by mouth daily. What changed: You were already taking a medication with the same name, and this prescription was added. Make sure you understand how and when to take each. Changed by: Terald Sleeper, PA-C   lisdexamfetamine 70 MG capsule Commonly known as: Vyvanse Take 1 capsule (70 mg total) by mouth daily. What changed: You were already taking a medication with the same name, and this prescription was added. Make sure you understand how and when to take each. Changed by: Terald Sleeper, PA-C   lisinopril 20 MG tablet Commonly known as: ZESTRIL Take 1 tablet (20 mg total) by mouth daily.    nystatin 100000 UNIT/ML suspension Commonly known as: MYCOSTATIN Take 5 mLs (500,000 Units total) by mouth 4 (four) times daily.   pantoprazole 40 MG tablet Commonly known as: PROTONIX TAKE ONE TAB BY MOUTH 2 TIMES DAILY   pioglitazone 30 MG tablet Commonly known as: Actos Take 1 tablet (30 mg total) by mouth daily.   sertraline 100 MG tablet Commonly known as: ZOLOFT TAKE  2 TABLETS BY MOUTH EVERY DAY   sitaGLIPtin 50 MG tablet Commonly known as: Januvia Take 1 tablet (50 mg total) by mouth daily.   Symbicort 160-4.5 MCG/ACT inhaler Generic drug: budesonide-formoterol TAKE 2 PUFFS BY MOUTH TWICE A DAY   traMADol 50 MG tablet Commonly known as: ULTRAM Take 1 tablet (50 mg total) by mouth every 8 (eight) hours as needed for severe pain.          Objective:    BP 130/83   Pulse 82   Temp 98.2 F (36.8 C) (Temporal)   Ht '5\' 8"'$  (1.727 m)   Wt 246 lb (111.6 kg)   SpO2 99%   BMI 37.40 kg/m   Allergies  Allergen Reactions  . Diphenhydramine Other (See Comments)    Hair feels like it is crawling  . Morphine Nausea And Vomiting    PCA PUMP N/V IV PUSH IN ER NO PROBLEM PER PT.  . Prednisone Nausea And Vomiting  . Erythromycin Hives  . Sulfonamide Derivatives Other (See Comments)    Does not take due to family history    Wt Readings from Last 3 Encounters:  02/20/19 246 lb (111.6 kg)  01/17/19 234 lb 12.8 oz (106.5 kg)  11/16/18 250 lb 6.4 oz (113.6 kg)    Physical Exam Constitutional:      General: She is not in acute distress.    Appearance: Normal appearance. She is well-developed.  HENT:     Head: Normocephalic and atraumatic.  Cardiovascular:     Rate and Rhythm: Normal rate.  Pulmonary:     Effort: Pulmonary effort is normal.  Skin:    General: Skin is warm and dry.     Findings: No rash.  Neurological:     Mental Status: She is alert and oriented to person, place, and time.     Deep Tendon Reflexes: Reflexes are normal and symmetric.      Results for orders placed or performed in visit on 01/13/19  TSH  Result Value Ref Range   TSH 4.870 (H) 0.450 - 4.500 uIU/mL  Bayer DCA Hb A1c Waived  Result Value Ref Range   HB A1C (BAYER DCA - WAIVED) >14.0 (H) <7.0 %      Assessment & Plan:   1. Type 2 diabetes mellitus without complication, without long-term current use of insulin ([place])  2. Primary osteoarthritis of left knee  3. Primary osteoarthritis of right knee  4. History of left knee replacement  5. Status post right knee replacement  6. Morbid obesity ([place])  7. Attention deficit disorder (ADD) without hyperactivity - lisdexamfetamine (VYVANSE) 70 MG capsule; Take 1 capsule (70 mg total) by mouth daily.  Dispense: 30 capsule; Refill: 0  8. Acquired hypothyroidism - TSH  9. Hyperlipidemia, unspecified hyperlipidemia type - Lipid panel  10. Need for immunization against influenza - Flu Vaccine QUAD 36+ mos IM   Continue all other maintenance medications as listed above.  Follow up plan: Return in about 4 weeks (around 03/20/2019).  Educational handout given for [place]  Terald Sleeper PA-C [facility] [address]  [place], [place] [address] [phone]   02/20/2019, 9:14 PM

## 2019-02-20 NOTE — Patient Instructions (Signed)
Carbohydrate Counting for Diabetes Mellitus, Adult  Carbohydrate counting is a method of keeping track of how many carbohydrates you eat. Eating carbohydrates naturally increases the amount of sugar (glucose) in the blood. Counting how many carbohydrates you eat helps keep your blood glucose within normal limits, which helps you manage your diabetes (diabetes mellitus). It is important to know how many carbohydrates you can safely have in each meal. This is different for every person. A diet and nutrition specialist (registered dietitian) can help you make a meal plan and calculate how many carbohydrates you should have at each meal and snack. Carbohydrates are found in the following foods:  Grains, such as breads and cereals.  Dried beans and soy products.  Starchy vegetables, such as potatoes, peas, and corn.  Fruit and fruit juices.  Milk and yogurt.  Sweets and snack foods, such as cake, cookies, candy, chips, and soft drinks. How do I count carbohydrates? There are two ways to count carbohydrates in food. You can use either of the methods or a combination of both. Reading "Nutrition Facts" on packaged food The "Nutrition Facts" list is included on the labels of almost all packaged foods and beverages in the [place] It includes:  The serving size.  Information about nutrients in each serving, including the grams (g) of carbohydrate per serving. To use the "Nutrition Facts":  Decide how many servings you will have.  Multiply the number of servings by the number of carbohydrates per serving.  The resulting number is the total amount of carbohydrates that you will be having. Learning standard serving sizes of other foods When you eat carbohydrate foods that are not packaged or do not include "Nutrition Facts" on the label, you need to measure the servings in order to count the amount of carbohydrates:  Measure the foods that you will eat with a food scale or measuring cup, if needed.   Decide how many standard-size servings you will eat.  Multiply the number of servings by 15. Most carbohydrate-rich foods have about 15 g of carbohydrates per serving. ? For example, if you eat 8 oz (170 g) of strawberries, you will have eaten 2 servings and 30 g of carbohydrates (2 servings x 15 g = 30 g).  For foods that have more than one food mixed, such as soups and casseroles, you must count the carbohydrates in each food that is included. The following list contains standard serving sizes of common carbohydrate-rich foods. Each of these servings has about 15 g of carbohydrates:   hamburger bun or  English muffin.   oz (15 mL) syrup.   oz (14 g) jelly.  1 slice of bread.  1 six-inch tortilla.  3 oz (85 g) cooked rice or pasta.  4 oz (113 g) cooked dried beans.  4 oz (113 g) starchy vegetable, such as peas, corn, or potatoes.  4 oz (113 g) hot cereal.  4 oz (113 g) mashed potatoes or  of a large baked potato.  4 oz (113 g) canned or frozen fruit.  4 oz (120 mL) fruit juice.  4-6 crackers.  6 chicken nuggets.  6 oz (170 g) unsweetened dry cereal.  6 oz (170 g) plain fat-free yogurt or yogurt sweetened with artificial sweeteners.  8 oz (240 mL) milk.  8 oz (170 g) fresh fruit or one small piece of fruit.  24 oz (680 g) popped popcorn. Example of carbohydrate counting Sample meal  3 oz (85 g) chicken breast.  6 oz (170 g)   brown rice.  4 oz (113 g) corn.  8 oz (240 mL) milk.  8 oz (170 g) strawberries with sugar-free whipped topping. Carbohydrate calculation 1. Identify the foods that contain carbohydrates: ? Rice. ? Corn. ? Milk. ? Strawberries. 2. Calculate how many servings you have of each food: ? 2 servings rice. ? 1 serving corn. ? 1 serving milk. ? 1 serving strawberries. 3. Multiply each number of servings by 15 g: ? 2 servings rice x 15 g = 30 g. ? 1 serving corn x 15 g = 15 g. ? 1 serving milk x 15 g = 15 g. ? 1 serving  strawberries x 15 g = 15 g. 4. Add together all of the amounts to find the total grams of carbohydrates eaten: ? 30 g + 15 g + 15 g + 15 g = 75 g of carbohydrates total. Summary  Carbohydrate counting is a method of keeping track of how many carbohydrates you eat.  Eating carbohydrates naturally increases the amount of sugar (glucose) in the blood.  Counting how many carbohydrates you eat helps keep your blood glucose within normal limits, which helps you manage your diabetes.  A diet and nutrition specialist (registered dietitian) can help you make a meal plan and calculate how many carbohydrates you should have at each meal and snack. This information is not intended to replace advice given to you by your health care provider. Make sure you discuss any questions you have with your health care provider. Document Released: 05/04/2005 Document Revised: 11/26/2016 Document Reviewed: 10/16/2015 Elsevier Patient Education  2020 Elsevier Inc.  

## 2019-02-27 ENCOUNTER — Other Ambulatory Visit: Payer: Self-pay | Admitting: Physician Assistant

## 2019-03-09 ENCOUNTER — Encounter

## 2019-03-10 ENCOUNTER — Inpatient Hospital Stay: Admit: 2019-03-10 | Payer: PRIVATE HEALTH INSURANCE | Attending: Family Medicine | Primary: Family Medicine

## 2019-03-10 DIAGNOSIS — R1011 Right upper quadrant pain: Secondary | ICD-10-CM

## 2019-03-17 ENCOUNTER — Other Ambulatory Visit: Payer: Medicaid Other

## 2019-03-17 ENCOUNTER — Other Ambulatory Visit: Payer: Self-pay

## 2019-03-18 LAB — LIPID PANEL
Chol/HDL Ratio: 7.9 ratio — ABNORMAL HIGH (ref 0.0–4.4)
Cholesterol, Total: 262 mg/dL — ABNORMAL HIGH (ref 100–199)
HDL: 33 mg/dL — ABNORMAL LOW (ref >39)
LDL Chol Calc (NIH): 198 mg/dL — ABNORMAL HIGH (ref 0–99)
Triglycerides: 165 mg/dL — ABNORMAL HIGH (ref 0–149)
VLDL Cholesterol Cal: 31 mg/dL (ref 5–40)

## 2019-03-18 LAB — TSH: TSH: 3.8 u[IU]/mL (ref 0.450–4.500)

## 2019-03-20 ENCOUNTER — Other Ambulatory Visit: Payer: Self-pay | Admitting: Physician Assistant

## 2019-03-20 ENCOUNTER — Other Ambulatory Visit: Payer: Self-pay

## 2019-03-20 DIAGNOSIS — M1711 Unilateral primary osteoarthritis, right knee: Secondary | ICD-10-CM

## 2019-03-21 ENCOUNTER — Other Ambulatory Visit: Payer: Self-pay | Admitting: Physician Assistant

## 2019-03-21 ENCOUNTER — Ambulatory Visit (INDEPENDENT_AMBULATORY_CARE_PROVIDER_SITE_OTHER): Payer: Medicaid Other | Admitting: Physician Assistant

## 2019-03-21 ENCOUNTER — Encounter: Payer: Self-pay | Admitting: Physician Assistant

## 2019-03-21 VITALS — BP 117/78 | HR 82 | Temp 97.5°F | Ht 68.0 in | Wt 248.6 lb

## 2019-03-21 DIAGNOSIS — F909 Attention-deficit hyperactivity disorder, unspecified type: Secondary | ICD-10-CM

## 2019-03-21 DIAGNOSIS — E039 Hypothyroidism, unspecified: Secondary | ICD-10-CM

## 2019-03-21 DIAGNOSIS — Z96652 Presence of left artificial knee joint: Secondary | ICD-10-CM

## 2019-03-21 DIAGNOSIS — E785 Hyperlipidemia, unspecified: Secondary | ICD-10-CM | POA: Diagnosis not present

## 2019-03-21 DIAGNOSIS — M1712 Unilateral primary osteoarthritis, left knee: Secondary | ICD-10-CM

## 2019-03-21 DIAGNOSIS — F341 Dysthymic disorder: Secondary | ICD-10-CM | POA: Diagnosis not present

## 2019-03-21 DIAGNOSIS — E1065 Type 1 diabetes mellitus with hyperglycemia: Secondary | ICD-10-CM

## 2019-03-21 DIAGNOSIS — Z96651 Presence of right artificial knee joint: Secondary | ICD-10-CM | POA: Insufficient documentation

## 2019-03-21 DIAGNOSIS — M1711 Unilateral primary osteoarthritis, right knee: Secondary | ICD-10-CM

## 2019-03-21 LAB — BAYER DCA HB A1C WAIVED: HB A1C (BAYER DCA - WAIVED): 8.5 % — ABNORMAL HIGH (ref <7.0)

## 2019-03-21 MED ORDER — TRAMADOL HCL 50 MG PO TABS: 50.0000 mg | ORAL_TABLET | Freq: Three times a day (TID) | ORAL | 5 refills | Status: DC | PRN

## 2019-03-21 NOTE — Progress Notes (Signed)
BP 117/78   Pulse 82   Temp (!) 97.5 F (36.4 C) (Temporal)   Ht '5\' 8"'$  (1.727 m)   Wt 248 lb 9.6 oz (112.8 kg)   SpO2 97%   BMI 37.80 kg/m    Subjective:    Patient ID: Judith [name], female    DOB: 04-Jan-1971, 48 y.o.   MRN: [id number]  HPI: Judith [name] is a 48 y.o. female presenting on 03/21/2019 for Diabetes  The patient reports that she was not approved for disability.  And is going to have to go back to work.  She is concerned about a long duration of standing like her job requires.  So we have discussed the possibility of having some restriction.  And we will write a letter explaining these.     They will say:  She can return to work with the following restrictions. No more than 4 days in a row. Allow for 15 minute break every 2 hours.  Return to work 04/03/2019 with the above limitations through 05/28/2019.  History of disability:  She is still under her disability leave related to her osteoarthritis of her knees.  She was out through Apr 02, 2019 at our last note.  At this time she has seen her orthopedist at [facility] in [facility].  Is Dr. Terrial Rhodes III.  His phone number is [phone].    He reports that it can sometimes take a year for a joint to completely heal.  In February it will be 1 year for her right knee.  And it will be 1-1/2-year for the left at that time.  She has completed her physical therapy and now is just doing home exercises and strengthening.  We will continue for her to be out at this time due to the fact that it is still hurting significantly and swelling when she is up on it for very long.   Her job includes standing on concrete floor, walking, moving merchandise.  Because of her company, an orthopedic program was available to her.  She went out of state to have her surgeries performed.  Therefore there is no local orthopedist here in the  [place] area.    However at this time the patient is on Medicaid because of  being out of work.  So she is working on long-term disability. A note can be sent to her [place] taking her out through April 02, 2019.  The patient was having severe uncontrolled diabetes at her last visit.  The A1c was over 14.0 at her August visit.  However today it is 8.5.  She is having very good changes in her diet and continuing her medication.  At this point she is still using her Lantus is and is up to 45 units.  Most of the time her fasting blood sugars in the morning are between 80 and 90.  The patient does need refills on her medication including ADHD medicine.  Currently taking Vyvanse 70 mg.  She reports that she is doing well with this.  Past Medical History:  Diagnosis Date  . Asthma   . Bipolar 1 disorder ([place])   . Depression   . GERD (gastroesophageal reflux disease)   . Hyperlipemia   . Hypothyroid   . Migraines    Relevant past medical, surgical, family and social history reviewed and updated as indicated. Interim medical history since our last visit reviewed. Allergies and medications reviewed and updated. DATA REVIEWED:  CHART IN EPIC  Family History reviewed for pertinent findings.  Review of Systems  Constitutional: Negative.   HENT: Negative.   Eyes: Negative.   Respiratory: Negative.   Gastrointestinal: Negative.   Genitourinary: Negative.   Musculoskeletal: Positive for arthralgias, joint swelling and myalgias.    Allergies as of 03/21/2019      Reactions   Diphenhydramine Other (See Comments)   Hair feels like it is crawling   Morphine Nausea And Vomiting   PCA PUMP N/V IV PUSH IN ER NO PROBLEM PER PT.   Prednisone Nausea And Vomiting   Erythromycin Hives   Sulfonamide Derivatives Other (See Comments)   Does not take due to family history      Medication List       Accurate as of March 21, 2019 11:59 PM. If you have any questions, ask your nurse or doctor.        STOP taking these medications   aspirin EC 81 MG  tablet Stopped by: Terald Sleeper, PA-C   busPIRone 15 MG tablet Commonly known as: BUSPAR Stopped by: Terald Sleeper, PA-C   fluconazole 150 MG tablet Commonly known as: Diflucan Stopped by: Terald Sleeper, PA-C     TAKE these medications   Accu-Chek FastClix Lancets Misc Test blood sugars twice daily Dx E11.9   Accu-Chek Guide test strip Generic drug: glucose blood CHECK BLOOD SUGARS TWICE A DAY AND AS DIRECTED   albuterol (2.5 MG/3ML) 0.083% nebulizer solution Commonly known as: PROVENTIL Nebulize 1 vial three times daily   atorvastatin 10 MG tablet Commonly known as: LIPITOR Take 1 tablet (10 mg total) by mouth daily.   blood glucose meter kit and supplies Dispense based on patient and insurance preference. Check blood sugar bid and as needed. Dx: E11.9   cetirizine 10 MG tablet Commonly known as: ZYRTEC Take 1 tablet (10 mg total) by mouth daily.   diclofenac sodium 1 % Gel Commonly known as: VOLTAREN Apply 4 g topically 4 (four) times daily.   dicyclomine 20 MG tablet Commonly known as: BENTYL Take 1 tablet (20 mg total) by mouth every 6 (six) hours.   hydrOXYzine 25 MG tablet Commonly known as: ATARAX/VISTARIL Take 1 tablet (25 mg total) by mouth 3 (three) times daily.   ibuprofen 800 MG tablet Commonly known as: ADVIL TAKE 1 TABLET BY MOUTH EVERY 8 HOURS AS NEEDED   Insulin Pen Needle 31G X 5 MM Misc 1 Units by Does not apply route daily.   lamoTRIgine 150 MG tablet Commonly known as: LAMICTAL Take 1 tablet (150 mg total) by mouth 2 (two) times daily.   Lantus SoloStar 100 UNIT/ML Solostar Pen Generic drug: Insulin Glargine Inject 20-50 Units into the skin daily.   levothyroxine 125 MCG tablet Commonly known as: SYNTHROID TAKE ONE TAB BY MOUTH DAILY   lisdexamfetamine 70 MG capsule Commonly known as: Vyvanse Take 1 capsule (70 mg total) by mouth daily.   lisdexamfetamine 70 MG capsule Commonly known as: Vyvanse Take 1 capsule (70 mg total)  by mouth daily.   lisdexamfetamine 70 MG capsule Commonly known as: Vyvanse Take 1 capsule (70 mg total) by mouth daily.   lisinopril 20 MG tablet Commonly known as: ZESTRIL Take 1 tablet (20 mg total) by mouth daily.   montelukast 10 MG tablet Commonly known as: SINGULAIR TAKE ONE TAB BY MOUTH AT BEDTIME   nystatin 100000 UNIT/ML suspension Commonly known as: MYCOSTATIN Take 5 mLs (500,000 Units total) by mouth 4 (four) times daily.  pantoprazole 40 MG tablet Commonly known as: PROTONIX TAKE ONE TAB BY MOUTH 2 TIMES DAILY   pioglitazone 30 MG tablet Commonly known as: Actos Take 1 tablet (30 mg total) by mouth daily.   sertraline 100 MG tablet Commonly known as: ZOLOFT TAKE 2 TABLETS BY MOUTH EVERY DAY   sitaGLIPtin 50 MG tablet Commonly known as: Januvia Take 1 tablet (50 mg total) by mouth daily.   Symbicort 160-4.5 MCG/ACT inhaler Generic drug: budesonide-formoterol TAKE 2 PUFFS BY MOUTH TWICE A DAY   traMADol 50 MG tablet Commonly known as: ULTRAM Take 1 tablet (50 mg total) by mouth every 8 (eight) hours as needed for severe pain.          Objective:    BP 117/78   Pulse 82   Temp (!) 97.5 F (36.4 C) (Temporal)   Ht '5\' 8"'$  (1.727 m)   Wt 248 lb 9.6 oz (112.8 kg)   SpO2 97%   BMI 37.80 kg/m   Allergies  Allergen Reactions  . Diphenhydramine Other (See Comments)    Hair feels like it is crawling  . Morphine Nausea And Vomiting    PCA PUMP N/V IV PUSH IN ER NO PROBLEM PER PT.  . Prednisone Nausea And Vomiting  . Erythromycin Hives  . Sulfonamide Derivatives Other (See Comments)    Does not take due to family history    Wt Readings from Last 3 Encounters:  03/21/19 248 lb 9.6 oz (112.8 kg)  02/20/19 246 lb (111.6 kg)  01/17/19 234 lb 12.8 oz (106.5 kg)    Physical Exam Constitutional:      General: She is not in acute distress.    Appearance: Normal appearance. She is well-developed.  HENT:     Head: Normocephalic and atraumatic.   Cardiovascular:     Rate and Rhythm: Normal rate.  Pulmonary:     Effort: Pulmonary effort is normal.  Musculoskeletal:     Right knee: She exhibits decreased range of motion and deformity.     Left knee: She exhibits decreased range of motion and deformity.  Skin:    General: Skin is warm and dry.     Findings: No rash.  Neurological:     Mental Status: She is alert and oriented to person, place, and time.     Deep Tendon Reflexes: Reflexes are normal and symmetric.    Diabetic Foot Exam - Simple   Simple Foot Form Diabetic Foot exam was performed with the following findings: Yes 03/21/2019 11:05 AM  Visual Inspection No deformities, no ulcerations, no other skin breakdown bilaterally: Yes Sensation Testing Intact to touch and monofilament testing bilaterally: Yes Pulse Check Posterior Tibialis and Dorsalis pulse intact bilaterally: Yes Comments     Results for orders placed or performed in visit on 03/21/19  Bayer DCA Hb A1c Waived  Result Value Ref Range   HB A1C (BAYER DCA - WAIVED) 8.5 (H) <7.0 %      Assessment & Plan:   1. History of left knee replacement Continue activity and daily physical therapy exercises Attempt to return to work with limitations as noted above  2. Primary osteoarthritis of left knee Continue activity and daily physical therapy exercises Attempt to return to work with limitations as noted above  3. Primary osteoarthritis of right knee Continue activity and daily physical therapy exercises Attempt to return to work with limitations as noted above - traMADol (ULTRAM) 50 MG tablet; Take 1 tablet (50 mg total) by mouth every 8 (eight)  hours as needed for severe pain.  Dispense: 90 tablet; Refill: 5  4. History of right knee joint replacement Continue activity and daily physical therapy exercises Attempt to return to work with limitations as noted above  5. Type 1 diabetes mellitus with hyperglycemia (H[facility]) - Bayer DCA Hb A1c Waived  6.  Acquired hypothyroidism Continue medications  7. Hyperlipidemia, unspecified hyperlipidemia type Continue diet and exercise  8. ANXIETY DEPRESSION Continue medication  9. Attention deficit hyperactivity disorder (ADHD), unspecified ADHD type Continue medications  Patient states that she needs the letter from today sent to [facility] She also needs all notes from March 2020 to current day sent to [facility].     Continue all other maintenance medications as listed above.  Follow up plan: Return in about 4 weeks (around 04/18/2019).  Educational handout given for [place] PA-C [place] [address]  [place], [place] [address] [phone]   03/26/2019, 3:41 PM

## 2019-05-01 ENCOUNTER — Telehealth
Admit: 2019-05-01 | Discharge: 2019-05-03 | Payer: PRIVATE HEALTH INSURANCE | Attending: Family Medicine | Primary: Family Medicine

## 2019-05-01 ENCOUNTER — Telehealth: Attending: Family Medicine | Primary: Family Medicine

## 2019-05-01 DIAGNOSIS — U071 COVID-19: Secondary | ICD-10-CM

## 2019-05-01 NOTE — Progress Notes (Signed)
 Chief Complaint   Patient presents with   . 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    I have reviewed the patient's medical history in detail and updated the computerized patient record.    Patient has not been out of the country in (8-9 months), NO diarrhea, NO cough, NO chest conjestion, NO temp.  Pt has not been around anyone with these symptoms.     1. Have you been to the ER, urgent care clinic since your last visit?  Hospitalized since your last visit?yes    2. Have you seen or consulted any other health care providers outside of the [facility] since your last visit?  Include any pap smears or colon screening. yes      Encouraged pt to discuss pt's wishes with spouse/partner/family and bring them in the next appt to follow thru with the Advanced Directive    Fall Risk Assessment, last 12 mths 05/01/2019   Able to walk? Yes   Fall in past 12 months? Yes   Fall with injury? Yes   Number of falls in past 12 months 1   Fall Risk Score 2       3 most recent PHQ Screens 05/01/2019   Little interest or pleasure in doing things Several days   Feeling down, depressed, irritable, or hopeless Several days   Total Score PHQ 2 2       Abuse Screening Questionnaire 05/01/2019   Do you ever feel afraid of your partner? N   Are you in a relationship with someone who physically or mentally threatens you? N   Is it safe for you to go home? Y       ADL Assessment 05/01/2019   Feeding yourself No Help Needed   Getting from bed to chair No Help Needed   Getting dressed No Help Needed   Bathing or showering No Help Needed   Walk across the room (includes cane/walker) No Help Needed   Using the telphone No Help Needed   Taking your medications No Help Needed   Preparing meals No Help Needed   Managing money (expenses/bills) No Help Needed   Moderately strenuous housework (laundry) No Help Needed   Shopping for  personal items (toiletries/medicines) No Help Needed   Shopping for groceries No Help Needed   Driving No Help Needed   Climbing a flight of stairs No Help Needed   Getting to places beyond walking distances No Help Needed

## 2019-05-01 NOTE — Progress Notes (Signed)
Neg pt notified

## 2019-05-01 NOTE — Progress Notes (Signed)
Subjective:   Sara Cooke is a 48 y.o. female who complains of congestion, nasal blockage, dry cough, headache and ?loss of smell for 4-5 days days, stable since that time.  She denies a history of fevers, shortness of breath and wheezing.  She is an employee of [employer].  Evaluation to date: . none  Treatment to date: antihistamines, OTC products.  Patient does not smoke cigarettes.  Relevant PMH: No pertinent additional PMH.  Allergies   Allergen Reactions   ??? Percocet [Oxycodone-Acetaminophen] Nausea and Vomiting   ??? Percocet [Oxycodone-Acetaminophen] Nausea and Vomiting   ??? Wellbutrin [Bupropion Hcl] Itching   ??? Wellbutrin [Bupropion Hcl] Itching     Patient has known exposures to [place] whose kids both have +covid with coronavirus infection.  Covid status currently unknown.  There has been no travel to the infected countries of [place], [place], [place], [place], or [place], recently.  Did go to [place] Nov 24.Patient does not live in assisted living or nursing facility.  Last covid test Nov 21 was neg.  Both parents have been diagnosed with Covid but have recovered.    Social History     Tobacco Use   ??? Smoking status: Never Smoker   ??? Smokeless tobacco: Never Used   Substance Use Topics   ??? Alcohol use: No     Comment: socially        Review of Systems  Pertinent items are noted in HPI.    Objective:     There were no vitals taken for this visit.  General:  alert, cooperative, no distress   Eyes:    Ears:    Sinuses:    Mouth:     Neck:    Heart:    Lungs:    Abdomen:       Assessment/Plan:   allergic rhinitis  Nasal steroids per orders.  RTC prn.  Encounter Diagnoses   Name Primary?   ??? COVID-19 Yes   ??? Non-seasonal allergic rhinitis due to pollen      Orders Placed This Encounter   ??? NOVEL CORONAVIRUS (COVID-19)   .  Follow-up as needed

## 2019-05-01 NOTE — Progress Notes (Signed)
Chief Complaint   Patient presents with   ??? 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    I have reviewed the patient's medical history in detail and updated the computerized patient record.    Patient has not been out of the country in (8-9 months), NO diarrhea, NO cough, NO chest conjestion, NO temp.  Pt has not been around anyone with these symptoms.     1. Have you been to the ER, urgent care clinic since your last visit?  Hospitalized since your last visit?yes    2. Have you seen or consulted any other health care providers outside of the [facility] since your last visit?  Include any pap smears or colon screening. yes      Encouraged pt to discuss pt's wishes with spouse/partner/family and bring them in the next appt to follow thru with the Advanced Directive    Fall Risk Assessment, last 12 mths 05/01/2019   Able to walk? Yes   Fall in past 12 months? Yes   Fall with injury? Yes   Number of falls in past 12 months 1   Fall Risk Score 2       3 most recent PHQ Screens 05/01/2019   Little interest or pleasure in doing things Several days   Feeling down, depressed, irritable, or hopeless Several days   Total Score PHQ 2 2       Abuse Screening Questionnaire 05/01/2019   Do you ever feel afraid of your partner? N   Are you in a relationship with someone who physically or mentally threatens you? N   Is it safe for you to go home? Y       ADL Assessment 05/01/2019   Feeding yourself No Help Needed   Getting from bed to chair No Help Needed   Getting dressed No Help Needed   Bathing or showering No Help Needed   Walk across the room (includes cane/walker) No Help Needed   Using the telphone No Help Needed   Taking your medications No Help Needed   Preparing meals No Help Needed   Managing money (expenses/bills) No Help Needed   Moderately strenuous housework (laundry) No Help Needed    Shopping for personal items (toiletries/medicines) No Help Needed   Shopping for groceries No Help Needed   Driving No Help Needed   Climbing a flight of stairs No Help Needed   Getting to places beyond walking distances No Help Needed

## 2019-05-01 NOTE — Progress Notes (Signed)
Subjective:   Sara Cooke is a 48 y.o. female who complains of congestion, nasal blockage, dry cough, headache and ?loss of smell for 4-5 days days, stable since that time.  She denies a history of fevers, shortness of breath and wheezing.  She is an employee of [employer].  Evaluation to date: . none  Treatment to date: antihistamines, OTC products.  Patient does not smoke cigarettes.  Relevant PMH: No pertinent additional PMH.  Allergies   Allergen Reactions   ??? Percocet [Oxycodone-Acetaminophen] Nausea and Vomiting   ??? Percocet [Oxycodone-Acetaminophen] Nausea and Vomiting   ??? Wellbutrin [Bupropion Hcl] Itching   ??? Wellbutrin [Bupropion Hcl] Itching     Patient has known exposures to [place] whose kids both have +covid with coronavirus infection.  Covid status currently unknown.  There has been no travel to the infected countries of [place], [place], [place], [place], or [place], recently.  Did go to [facility] Nov 24.Patient does not live in assisted living or nursing facility.  Last covid test Nov 21 was neg.  Both parents have been diagnosed with Covid but have recovered.    Social History     Tobacco Use   ??? Smoking status: Never Smoker   ??? Smokeless tobacco: Never Used   Substance Use Topics   ??? Alcohol use: No     Comment: socially        Review of Systems  Pertinent items are noted in HPI.    Objective:     There were no vitals taken for this visit.  General:  alert, cooperative, no distress   Eyes:    Ears:    Sinuses:    Mouth:     Neck:    Heart:    Lungs:    Abdomen:       Assessment/Plan:   allergic rhinitis  Nasal steroids per orders.  RTC prn.  Encounter Diagnoses   Name Primary?   ??? COVID-19 Yes   ??? Non-seasonal allergic rhinitis due to pollen      Orders Placed This Encounter   ??? NOVEL CORONAVIRUS (COVID-19)   .  Follow-up as needed

## 2019-05-01 NOTE — Progress Notes (Signed)
Neg pt notified

## 2019-05-04 LAB — NOVEL CORONAVIRUS (COVID-19): SARS-CoV-2, NAA: NOT DETECTED

## 2019-05-04 LAB — COVID-19: SARS-CoV-2, NAA: NOT DETECTED

## 2019-05-08 ENCOUNTER — Other Ambulatory Visit: Payer: Self-pay | Admitting: Physician Assistant

## 2019-05-09 MED ORDER — AMOXICILLIN 500 MG TABLET
500 mg | ORAL_TABLET | Freq: Three times a day (TID) | ORAL | 0 refills | Status: AC
Start: 2019-05-09 — End: 2019-05-16

## 2019-05-09 NOTE — Telephone Encounter (Signed)
Sinusitis Sx OKay Amox

## 2019-05-18 ENCOUNTER — Other Ambulatory Visit: Payer: Self-pay | Admitting: Family Medicine

## 2019-05-18 DIAGNOSIS — B373 Candidiasis of vulva and vagina: Secondary | ICD-10-CM

## 2019-05-18 DIAGNOSIS — B3731 Acute candidiasis of vulva and vagina: Secondary | ICD-10-CM

## 2019-05-22 ENCOUNTER — Ambulatory Visit (INDEPENDENT_AMBULATORY_CARE_PROVIDER_SITE_OTHER): Payer: Medicaid Other | Admitting: Physician Assistant

## 2019-05-22 ENCOUNTER — Encounter: Payer: Self-pay | Admitting: Physician Assistant

## 2019-05-22 DIAGNOSIS — Z96651 Presence of right artificial knee joint: Secondary | ICD-10-CM

## 2019-05-22 DIAGNOSIS — Z96652 Presence of left artificial knee joint: Secondary | ICD-10-CM | POA: Diagnosis not present

## 2019-05-22 DIAGNOSIS — M1711 Unilateral primary osteoarthritis, right knee: Secondary | ICD-10-CM

## 2019-05-22 DIAGNOSIS — F988 Other specified behavioral and emotional disorders with onset usually occurring in childhood and adolescence: Secondary | ICD-10-CM

## 2019-05-22 DIAGNOSIS — M1712 Unilateral primary osteoarthritis, left knee: Secondary | ICD-10-CM | POA: Diagnosis not present

## 2019-05-22 MED ORDER — LISDEXAMFETAMINE DIMESYLATE 70 MG PO CAPS: 70.0000 mg | ORAL_CAPSULE | Freq: Every day | ORAL | 0 refills | Status: DC

## 2019-05-22 NOTE — Progress Notes (Signed)
Telephone visit  Subjective: WO:[id number] from knee DJD and replacements PCP: Terald Sleeper, PA-C HPI:Judith [name] is a 49 y.o. female calls for telephone consult today. Patient provides verbal consent for consult held via phone.  Patient is identified with 2 separate identifiers.  At this time the entire area is on COVID-19 social distancing and stay home orders are in place.  Patient is of higher risk and therefore we are performing this by a virtual method.  Location of patient: home Location of provider: HOME Others present for call: no   On December 21 we made an adjustment to her restrictions that she could work 5 days in a row.  A letter was sent.  She states that this is hitting at about the right amount of time.  She is very tired at the end of the 5 days.  And it does take her couple days to recover.  She does not think she can go any further or work any longer shifts.  We will write a note for them to keep the restrictions at this amount of time over the next 6 months.    Letter:  To Whom it May Concern:  [name] return to work with the following restrictions. She can work 5 days in a row. Allow for 15 minute break every 2 hours.  Return to work 04/03/2019 with the above limitations through 11/25/2019.    History of disability:  She is still under her disability leave related to her osteoarthritis of her knees. She was out through Apr 02, 2019 at our last note. At this time she has seen her orthopedist at [facility]. Is Dr. Terrial Rhodes III. His phone number is [phone].  He reports that it can sometimes take a year for a joint to completely heal. In February it will be 1 year for her right knee. And it will be 1-1/2-year for the left at that time. She has completed her physical therapy and now is just doing home exercises and strengthening. We will continue for her to be out at this time due to the  fact that it is still hurting significantly and swelling when she is up on it for very long.   Her job includes standing on concrete floor, walking, moving merchandise. Because of her company, an orthopedic program was available to her. She went out of state to have her surgeries performed. Therefore there is no local orthopedist here in the  [place] area.   However at this time the patient is on Medicaid because of being out of work. So she is working on long-term disability. A note can be sent to her [place] taking her with restrictions as noted above.    She is asking about future FMLA leave, intermittent  ROS: Per HPI  Allergies  Allergen Reactions  . Diphenhydramine Other (See Comments)    Hair feels like it is crawling  . Morphine Nausea And Vomiting    PCA PUMP N/V IV PUSH IN ER NO PROBLEM PER PT.  . Prednisone Nausea And Vomiting  . Erythromycin Hives  . Sulfonamide Derivatives Other (See Comments)    Does not take due to family history   Past Medical History:  Diagnosis Date  . Asthma   . Bipolar 1 disorder ([place])   . Depression   . GERD (gastroesophageal reflux disease)   . Hyperlipemia   . Hypothyroid   . Migraines  Current Outpatient Medications:  .  Accu-Chek FastClix Lancets MISC, Test blood sugars twice daily Dx E11.9, Disp: 102 each, Rfl: 3 .  ACCU-CHEK GUIDE test strip, CHECK BLOOD SUGARS TWICE A DAY AND AS DIRECTED, Disp: 100 strip, Rfl: 0 .  albuterol (PROVENTIL) (2.5 MG/3ML) 0.083% nebulizer solution, Nebulize 1 vial three times daily, Disp: 150 mL, Rfl: 2 .  atorvastatin (LIPITOR) 10 MG tablet, Take 1 tablet (10 mg total) by mouth daily. (Patient not taking: Reported on 03/21/2019), Disp: 90 tablet, Rfl: 3 .  blood glucose meter kit and supplies, Dispense based on patient and insurance preference. Check blood sugar bid and as needed. Dx: E11.9, Disp: 1 each, Rfl: 0 .  cetirizine (ZYRTEC) 10 MG tablet, Take 1 tablet (10 mg total) by  mouth daily., Disp: 30 tablet, Rfl: 11 .  diclofenac sodium (VOLTAREN) 1 % GEL, Apply 4 g topically 4 (four) times daily., Disp: 400 g, Rfl: 2 .  dicyclomine (BENTYL) 20 MG tablet, Take 1 tablet (20 mg total) by mouth every 6 (six) hours., Disp: 60 tablet, Rfl: 5 .  fluconazole (DIFLUCAN) 150 MG tablet, TAKE ONE TAB BY MOUTH EACH WEEK, Disp: 4 tablet, Rfl: 0 .  hydrOXYzine (ATARAX/VISTARIL) 25 MG tablet, Take 1 tablet (25 mg total) by mouth 3 (three) times daily., Disp: 90 tablet, Rfl: 5 .  ibuprofen (ADVIL) 800 MG tablet, TAKE 1 TABLET BY MOUTH EVERY 8 HOURS AS NEEDED, Disp: 90 tablet, Rfl: 2 .  Insulin Glargine (LANTUS SOLOSTAR) 100 UNIT/ML Solostar Pen, Inject 20-50 Units into the skin daily., Disp: 5 pen, Rfl: PRN .  Insulin Pen Needle 31G X 5 MM MISC, 1 Units by Does not apply route daily., Disp: 100 each, Rfl: 5 .  lamoTRIgine (LAMICTAL) 150 MG tablet, Take 1 tablet (150 mg total) by mouth 2 (two) times daily., Disp: 60 tablet, Rfl: 5 .  levothyroxine (SYNTHROID, LEVOTHROID) 125 MCG tablet, TAKE ONE TAB BY MOUTH DAILY, Disp: 90 tablet, Rfl: 1 .  lisdexamfetamine (VYVANSE) 70 MG capsule, Take 1 capsule (70 mg total) by mouth daily., Disp: 30 capsule, Rfl: 0 .  lisdexamfetamine (VYVANSE) 70 MG capsule, Take 1 capsule (70 mg total) by mouth daily., Disp: 30 capsule, Rfl: 0 .  lisdexamfetamine (VYVANSE) 70 MG capsule, Take 1 capsule (70 mg total) by mouth daily., Disp: 30 capsule, Rfl: 0 .  lisinopril (ZESTRIL) 20 MG tablet, Take 1 tablet (20 mg total) by mouth daily., Disp: 90 tablet, Rfl: 3 .  montelukast (SINGULAIR) 10 MG tablet, TAKE ONE TAB BY MOUTH AT BEDTIME, Disp: 90 tablet, Rfl: 3 .  nystatin (MYCOSTATIN) 100000 UNIT/ML suspension, Take 5 mLs (500,000 Units total) by mouth 4 (four) times daily., Disp: 60 mL, Rfl: 0 .  pantoprazole (PROTONIX) 40 MG tablet, TAKE ONE TAB BY MOUTH 2 TIMES DAILY, Disp: 180 tablet, Rfl: 3 .  pioglitazone (ACTOS) 30 MG tablet, Take 1 tablet (30 mg total) by  mouth daily., Disp: 90 tablet, Rfl: 1 .  sertraline (ZOLOFT) 100 MG tablet, TAKE 2 TABLETS BY MOUTH EVERY DAY, Disp: 180 tablet, Rfl: 0 .  sitaGLIPtin (JANUVIA) 50 MG tablet, Take 1 tablet (50 mg total) by mouth daily., Disp: 30 tablet, Rfl: 5 .  SYMBICORT 160-4.5 MCG/ACT inhaler, TAKE 2 PUFFS BY MOUTH TWICE A DAY, Disp: 30 Inhaler, Rfl: 0 .  traMADol (ULTRAM) 50 MG tablet, Take 1 tablet (50 mg total) by mouth every 8 (eight) hours as needed for severe pain., Disp: 90 tablet, Rfl: 5  Assessment/ Plan: 49 y.o.  female   1. History of right knee joint replacement Return to work with noted restrictions  2. History of left knee replacement Continue work with noted restrictions  3. Primary osteoarthritis of left knee Continue work with medical  4. Primary osteoarthritis of right knee Continue work-related restrictions  5. Attention deficit disorder (ADD) without hyperactivity - lisdexamfetamine (VYVANSE) 70 MG capsule; Take 1 capsule (70 mg total) by mouth daily.  Dispense: 30 capsule; Refill: 0    Return in about 4 weeks (around 06/19/2019) for recheck medications Vyvanse UDS and contract.  Continue all other maintenance medications as listed above.  Start time: 2:30 PM End time: 2:42 PM  Meds ordered this encounter  Medications  . lisdexamfetamine (VYVANSE) 70 MG capsule    Sig: Take 1 capsule (70 mg total) by mouth daily.    Dispense:  30 capsule    Refill:  0    Order Specific Question:   Supervising Provider    Answer:   Janora Norlander [[id number]]    Particia Nearing PA-C [facility] [phone]

## 2019-05-29 DIAGNOSIS — Z20828 Contact with and (suspected) exposure to other viral communicable diseases: Secondary | ICD-10-CM | POA: Diagnosis not present

## 2019-05-31 ENCOUNTER — Telehealth: Payer: Self-pay | Admitting: Physician Assistant

## 2019-06-09 ENCOUNTER — Encounter: Payer: Self-pay | Admitting: Nurse Practitioner

## 2019-06-09 ENCOUNTER — Other Ambulatory Visit: Payer: Self-pay

## 2019-06-09 ENCOUNTER — Ambulatory Visit: Payer: Medicaid Other | Admitting: Nurse Practitioner

## 2019-06-09 VITALS — BP 113/71 | HR 82 | Temp 97.5°F | Resp 20 | Ht 68.0 in | Wt 267.0 lb

## 2019-06-09 DIAGNOSIS — T2112XA Burn of first degree of abdominal wall, initial encounter: Secondary | ICD-10-CM

## 2019-06-09 MED ORDER — SILVER SULFADIAZINE 1 % EX CREA: 1.0000 "application " | TOPICAL_CREAM | Freq: Every day | CUTANEOUS | 1 refills | Status: DC
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
## 2019-06-09 NOTE — Patient Instructions (Signed)
Burn Care, Adult A burn is an injury to the skin or the tissues under the skin. There are three types of burns:  First degree. These burns may cause the skin to be red and a bit swollen.  Second degree. These burns are very painful and cause the skin to be very red. The skin may also leak fluid, look shiny, and start to have blisters.  Third degree. These burns cause permanent damage. They turn the skin white or black and make it look charred, dry, and leathery. Taking care of your burn properly can help to prevent pain and infection. It can also help the burn to heal more quickly. How is this treated? Right after a burn:  Rinse or soak the burn under cool water. Do this for several minutes. Do not put ice on your burn. That can cause more damage.  Lightly cover the burn with a clean (sterile) cloth (dressing). Burn care  Raise (elevate) the injured area above the level of your heart while sitting or lying down.  Follow instructions from your doctor about: ? How to clean and take care of the burn. ? When to change and remove the cloth.  Check your burn every day for signs of infection. Check for: ? More redness, swelling, or pain. ? Warmth. ? Pus or a bad smell. Medicine   Take over-the-counter and prescription medicines only as told by your doctor.  If you were prescribed antibiotic medicine, take or apply it as told by your doctor. Do not stop using the antibiotic even if your condition improves. General instructions  To prevent infection: ? Do not put butter, oil, or other home treatments on the burn. ? Do not scratch or pick at the burn. ? Do not break any blisters. ? Do not peel skin.  Do not rub your burn, even when you are cleaning it.  Protect your burn from the sun. Contact a doctor if:  Your condition does not get better.  Your condition gets worse.  You have a fever.  Your burn looks different or starts to have black or red spots on it.  Your burn  feels warm to the touch.  Your pain is not controlled with medicine. Get help right away if:  You have redness, swelling, or pain at the site of the burn.  You have fluid, blood, or pus coming from your burn.  You have red streaks near the burn.  You have very bad pain. This information is not intended to replace advice given to you by your health care provider. Make sure you discuss any questions you have with your health care provider. Document Revised: 08/24/2018 Document Reviewed: 10/22/2015 Elsevier Patient Education  2020 [employer]  

## 2019-06-19 ENCOUNTER — Other Ambulatory Visit: Payer: Self-pay

## 2019-06-20 ENCOUNTER — Ambulatory Visit: Payer: Medicaid Other | Admitting: Physician Assistant

## 2019-06-20 ENCOUNTER — Encounter: Payer: Self-pay | Admitting: Physician Assistant

## 2019-06-20 VITALS — BP 124/84 | HR 92 | Temp 97.0°F | Ht 68.0 in | Wt 271.0 lb

## 2019-06-20 DIAGNOSIS — E785 Hyperlipidemia, unspecified: Secondary | ICD-10-CM | POA: Diagnosis not present

## 2019-06-20 DIAGNOSIS — M5416 Radiculopathy, lumbar region: Secondary | ICD-10-CM

## 2019-06-20 DIAGNOSIS — T2112XA Burn of first degree of abdominal wall, initial encounter: Secondary | ICD-10-CM

## 2019-06-20 DIAGNOSIS — E039 Hypothyroidism, unspecified: Secondary | ICD-10-CM

## 2019-06-20 DIAGNOSIS — T2112XD Burn of first degree of abdominal wall, subsequent encounter: Secondary | ICD-10-CM | POA: Diagnosis not present

## 2019-06-20 DIAGNOSIS — F39 Unspecified mood [affective] disorder: Secondary | ICD-10-CM | POA: Diagnosis not present

## 2019-06-20 DIAGNOSIS — E1065 Type 1 diabetes mellitus with hyperglycemia: Secondary | ICD-10-CM

## 2019-06-20 LAB — BAYER DCA HB A1C WAIVED: HB A1C (BAYER DCA - WAIVED): 7.8 % — ABNORMAL HIGH (ref <7.0)

## 2019-06-20 MED ORDER — SERTRALINE HCL 100 MG PO TABS: 200.0000 mg | ORAL_TABLET | Freq: Every day | ORAL | 3 refills | Status: DC

## 2019-06-20 MED ORDER — MELOXICAM 7.5 MG PO TABS: 7.5000 mg | ORAL_TABLET | Freq: Every day | ORAL | 0 refills | Status: DC

## 2019-06-20 MED ORDER — CYCLOBENZAPRINE HCL 10 MG PO TABS: 10.0000 mg | ORAL_TABLET | Freq: Three times a day (TID) | ORAL | 5 refills | Status: DC | PRN

## 2019-06-21 LAB — LIPID PANEL
Chol/HDL Ratio: 8.9 ratio — ABNORMAL HIGH (ref 0.0–4.4)
Cholesterol, Total: 284 mg/dL — ABNORMAL HIGH (ref 100–199)
HDL: 32 mg/dL — ABNORMAL LOW (ref >39)
LDL Chol Calc (NIH): 162 mg/dL — ABNORMAL HIGH (ref 0–99)
Triglycerides: 465 mg/dL — ABNORMAL HIGH (ref 0–149)
VLDL Cholesterol Cal: 90 mg/dL — ABNORMAL HIGH (ref 5–40)

## 2019-06-21 LAB — CBC WITH DIFFERENTIAL/PLATELET
Basophils Absolute: 0.1 10*3/uL (ref 0.0–0.2)
Basos: 1 %
EOS (ABSOLUTE): 0.2 10*3/uL (ref 0.0–0.4)
Eos: 2 %
Hematocrit: 42.3 % (ref 34.0–46.6)
Hemoglobin: 14.5 g/dL (ref 11.1–15.9)
Immature Grans (Abs): 0 10*3/uL (ref 0.0–0.1)
Immature Granulocytes: 0 %
Lymphocytes Absolute: 3.6 10*3/uL — ABNORMAL HIGH (ref 0.7–3.1)
Lymphs: 36 %
MCH: 28 pg (ref 26.6–33.0)
MCHC: 34.3 g/dL (ref 31.5–35.7)
MCV: 82 fL (ref 79–97)
Monocytes Absolute: 0.8 10*3/uL (ref 0.1–0.9)
Monocytes: 8 %
Neutrophils Absolute: 5.4 10*3/uL (ref 1.4–7.0)
Neutrophils: 53 %
Platelets: 365 10*3/uL (ref 150–450)
RBC: 5.17 x10E6/uL (ref 3.77–5.28)
RDW: 12.4 % (ref 11.7–15.4)
WBC: 10 10*3/uL (ref 3.4–10.8)

## 2019-06-21 LAB — CMP14+EGFR
ALT: 11 IU/L (ref 0–32)
AST: 11 IU/L (ref 0–40)
Albumin/Globulin Ratio: 1.4 (ref 1.2–2.2)
Albumin: 4.2 g/dL (ref 3.8–4.8)
Alkaline Phosphatase: 91 IU/L (ref 39–117)
BUN/Creatinine Ratio: 29 — ABNORMAL HIGH (ref 9–23)
BUN: 23 mg/dL (ref 6–24)
Bilirubin Total: <0.2 mg/dL (ref 0.0–1.2)
CO2: 26 mmol/L (ref 20–29)
Calcium: 9.2 mg/dL (ref 8.7–10.2)
Chloride: 99 mmol/L (ref 96–106)
Creatinine, Ser: 0.79 mg/dL (ref 0.57–1.00)
GFR calc Af Amer: 102 mL/min/{1.73_m2} (ref >59)
GFR calc non Af Amer: 88 mL/min/{1.73_m2} (ref >59)
Globulin, Total: 2.9 g/dL (ref 1.5–4.5)
Glucose: 110 mg/dL — ABNORMAL HIGH (ref 65–99)
Potassium: 4.4 mmol/L (ref 3.5–5.2)
Sodium: 138 mmol/L (ref 134–144)
Total Protein: 7.1 g/dL (ref 6.0–8.5)

## 2019-06-21 LAB — TSH: TSH: 1.9 u[IU]/mL (ref 0.450–4.500)

## 2019-06-22 NOTE — Progress Notes (Addendum)
Acute Office Visit  Subjective:    Patient ID: Judith [name], female    DOB: 03-18-1971, 49 y.o.   MRN: [id number]  Chief Complaint  Patient presents with  . Medical Management of Chronic Issues  . Diabetes  . Bilateral knee pain    Diabetes She presents for her follow-up diabetic visit. She has type 1 diabetes mellitus. Her disease course has been worsening. There are no hypoglycemic associated symptoms. Associated symptoms include fatigue. Pertinent negatives for diabetes include no chest pain. Symptoms are stable. Risk factors for coronary artery disease include sedentary lifestyle, dyslipidemia and diabetes mellitus. Current diabetic treatment includes insulin injections and oral agent (triple therapy). An ACE inhibitor/angiotensin II receptor blocker is being taken. She sees a podiatrist.Eye exam is current.  Knee Pain  The incident occurred more than 1 week ago. The pain is present in the right knee and left knee. The pain is at a severity of 7/10. The pain has been fluctuating since onset. Associated symptoms include an inability to bear weight and a loss of motion. The symptoms are aggravated by weight bearing.  Thyroid Problem Presents for follow-up visit. Symptoms include fatigue. Patient reports no constipation, depressed mood or hair loss. The symptoms have been stable. Her past medical history is significant for hyperlipidemia.  Hyperlipidemia This is a chronic problem. The problem is controlled. Pertinent negatives include no chest pain. Current antihyperlipidemic treatment includes statins. The current treatment provides moderate improvement of lipids.    Past Medical History:  Diagnosis Date  . Asthma   . Bipolar 1 disorder (Christine)   . Depression   . GERD (gastroesophageal reflux disease)   . Hyperlipemia   . Hypothyroid   . Migraines     Past Surgical History:  Procedure Laterality Date  . ABDOMINAL SURGERY    . CARPAL TUNNEL RELEASE Bilateral 1999  . CESAREAN  SECTION    . CHOLECYSTECTOMY    . THYROIDECTOMY    . TONSILLECTOMY      Family History  Problem Relation Age of Onset  . Mental illness Other   . Diabetes Other     Social History   Socioeconomic History  . Marital status: Divorced    Spouse name: Not on file  . Number of children: Not on file  . Years of education: Not on file  . Highest education level: Not on file  Occupational History  . Not on file  Tobacco Use  . Smoking status: Former Research scientist (life sciences)  . Smokeless tobacco: Never Used  Substance and Sexual Activity  . Alcohol use: No  . Drug use: No  . Sexual activity: Yes    Birth control/protection: I.U.D.  Other Topics Concern  . Not on file  Social History Narrative  . Not on file   Social Determinants of Health   Financial Resource Strain:   . Difficulty of Paying Living Expenses: Not on file  Food Insecurity:   . Worried About Charity fundraiser in the Last Year: Not on file  . Ran Out of Food in the Last Year: Not on file  Transportation Needs:   . Lack of Transportation (Medical): Not on file  . Lack of Transportation (Non-Medical): Not on file  Physical Activity:   . Days of Exercise per Week: Not on file  . Minutes of Exercise per Session: Not on file  Stress:   . Feeling of Stress : Not on file  Social Connections:   . Frequency of Communication with Friends and Family:  Not on file  . Frequency of Social Gatherings with Friends and Family: Not on file  . Attends Religious Services: Not on file  . Active Member of Clubs or Organizations: Not on file  . Attends Archivist Meetings: Not on file  . Marital Status: Not on file  Intimate Partner Violence:   . Fear of Current or Ex-Partner: Not on file  . Emotionally Abused: Not on file  . Physically Abused: Not on file  . Sexually Abused: Not on file    Outpatient Medications Prior to Visit  Medication Sig Dispense Refill  . Accu-Chek FastClix Lancets MISC Test blood sugars twice daily Dx  E11.9 102 each 3  . ACCU-CHEK GUIDE test strip CHECK BLOOD SUGARS TWICE A DAY AND AS DIRECTED 100 strip 0  . albuterol (PROVENTIL) (2.5 MG/3ML) 0.083% nebulizer solution Nebulize 1 vial three times daily 150 mL 2  . atorvastatin (LIPITOR) 10 MG tablet Take 1 tablet (10 mg total) by mouth daily. 90 tablet 3  . blood glucose meter kit and supplies Dispense based on patient and insurance preference. Check blood sugar bid and as needed. Dx: E11.9 1 each 0  . cetirizine (ZYRTEC) 10 MG tablet Take 1 tablet (10 mg total) by mouth daily. 30 tablet 11  . diclofenac sodium (VOLTAREN) 1 % GEL Apply 4 g topically 4 (four) times daily. 400 g 2  . dicyclomine (BENTYL) 20 MG tablet Take 1 tablet (20 mg total) by mouth every 6 (six) hours. 60 tablet 5  . fluconazole (DIFLUCAN) 150 MG tablet TAKE ONE TAB BY MOUTH EACH WEEK 4 tablet 0  . hydrOXYzine (ATARAX/VISTARIL) 25 MG tablet Take 1 tablet (25 mg total) by mouth 3 (three) times daily. 90 tablet 5  . Insulin Glargine (LANTUS SOLOSTAR) 100 UNIT/ML Solostar Pen Inject 20-50 Units into the skin daily. 5 pen PRN  . Insulin Pen Needle 31G X 5 MM MISC 1 Units by Does not apply route daily. 100 each 5  . lamoTRIgine (LAMICTAL) 150 MG tablet Take 1 tablet (150 mg total) by mouth 2 (two) times daily. 60 tablet 5  . levothyroxine (SYNTHROID, LEVOTHROID) 125 MCG tablet TAKE ONE TAB BY MOUTH DAILY 90 tablet 1  . lisdexamfetamine (VYVANSE) 70 MG capsule Take 1 capsule (70 mg total) by mouth daily. 30 capsule 0  . lisdexamfetamine (VYVANSE) 70 MG capsule Take 1 capsule (70 mg total) by mouth daily. 30 capsule 0  . lisdexamfetamine (VYVANSE) 70 MG capsule Take 1 capsule (70 mg total) by mouth daily. 30 capsule 0  . lisinopril (ZESTRIL) 20 MG tablet Take 1 tablet (20 mg total) by mouth daily. 90 tablet 3  . montelukast (SINGULAIR) 10 MG tablet TAKE ONE TAB BY MOUTH AT BEDTIME 90 tablet 3  . pantoprazole (PROTONIX) 40 MG tablet TAKE ONE TAB BY MOUTH 2 TIMES DAILY 180 tablet 3   . pioglitazone (ACTOS) 30 MG tablet Take 1 tablet (30 mg total) by mouth daily. 90 tablet 1  . silver sulfADIAZINE (SILVADENE) 1 % cream Apply 1 application topically daily. 400 g 1  . sitaGLIPtin (JANUVIA) 50 MG tablet Take 1 tablet (50 mg total) by mouth daily. 30 tablet 5  . SYMBICORT 160-4.5 MCG/ACT inhaler TAKE 2 PUFFS BY MOUTH TWICE A DAY 30 Inhaler 0  . traMADol (ULTRAM) 50 MG tablet Take 1 tablet (50 mg total) by mouth every 8 (eight) hours as needed for severe pain. 90 tablet 5  . ibuprofen (ADVIL) 800 MG tablet TAKE 1 TABLET BY  MOUTH EVERY 8 HOURS AS NEEDED 90 tablet 2  . nystatin (MYCOSTATIN) 100000 UNIT/ML suspension Take 5 mLs (500,000 Units total) by mouth 4 (four) times daily. 60 mL 0  . sertraline (ZOLOFT) 100 MG tablet TAKE 2 TABLETS BY MOUTH EVERY DAY 180 tablet 0   No facility-administered medications prior to visit.    Allergies  Allergen Reactions  . Diphenhydramine Other (See Comments)    Hair feels like it is crawling  . Morphine Nausea And Vomiting    PCA PUMP N/V IV PUSH IN ER NO PROBLEM PER PT.  . Prednisone Nausea And Vomiting  . Erythromycin Hives  . Sulfonamide Derivatives Other (See Comments)    Does not take due to family history    Review of Systems  Constitutional: Positive for fatigue. Negative for activity change and fever.  HENT: Negative.   Eyes: Negative.   Respiratory: Negative.  Negative for cough.   Cardiovascular: Negative.  Negative for chest pain.  Gastrointestinal: Negative.  Negative for abdominal pain and constipation.  Endocrine: Negative.   Genitourinary: Negative.  Negative for dysuria.  Musculoskeletal: Positive for arthralgias, back pain, gait problem and joint swelling.  Skin: Negative.        Objective:    Physical Exam Constitutional:      General: She is not in acute distress.    Appearance: Normal appearance. She is well-developed.  HENT:     Head: Normocephalic and atraumatic.  Cardiovascular:     Rate and  Rhythm: Normal rate.  Pulmonary:     Effort: Pulmonary effort is normal.  Musculoskeletal:     Right knee: Swelling present.     Left knee: Swelling and bony tenderness present.       Legs:  Skin:    General: Skin is warm and dry.     Findings: Burn and rash present. Rash is crusting.       Neurological:     Mental Status: She is alert and oriented to person, place, and time.     Deep Tendon Reflexes: Reflexes are normal and symmetric.     BP 124/84   Pulse 92   Temp (!) 97 F (36.1 C)   Ht _0  (1.727 m)   Wt 271 lb (122.9 kg)   SpO2 97%   BMI 41.21 kg/m  Wt Readings from Last 3 Encounters:  06/20/19 271 lb (122.9 kg)  06/09/19 267 lb (121.1 kg)  03/21/19 248 lb 9.6 oz (112.8 kg)    Health Maintenance Due  Topic Date Due  . OPHTHALMOLOGY EXAM  01/15/1980  . HIV Screening  01/14/1985  . MAMMOGRAM  05/14/2019    There are no preventive care reminders to display for this patient.   Lab Results  Component Value Date   TSH 1.900 06/20/2019   Lab Results  Component Value Date   WBC 10.0 06/20/2019   HGB 14.5 06/20/2019   HCT 42.3 06/20/2019   MCV 82 06/20/2019   PLT 365 06/20/2019   Lab Results  Component Value Date   NA 138 06/20/2019   K 4.4 06/20/2019   CO2 26 06/20/2019   GLUCOSE 110 (H) 06/20/2019   BUN 23 06/20/2019   CREATININE 0.79 06/20/2019   BILITOT <0.2 06/20/2019   ALKPHOS 91 06/20/2019   AST 11 06/20/2019   ALT 11 06/20/2019   PROT 7.1 06/20/2019   ALBUMIN 4.2 06/20/2019   CALCIUM 9.2 06/20/2019   ANIONGAP 14 11/02/2018   Lab Results  Component Value Date  CHOL 284 (H) 06/20/2019   Lab Results  Component Value Date   HDL 32 (L) 06/20/2019   Lab Results  Component Value Date   LDLCALC 162 (H) 06/20/2019   Lab Results  Component Value Date   TRIG 465 (H) 06/20/2019   Lab Results  Component Value Date   CHOLHDL 8.9 (H) 06/20/2019   Lab Results  Component Value Date   HGBA1C 7.8 (H) 06/20/2019       Assessment &  Plan:   Problem List Items Addressed This Visit      Endocrine   Hypothyroidism   Type 1 diabetes mellitus with hyperglycemia (HCC) - Primary   Relevant Orders   Bayer DCA Hb A1c Waived (Completed)     Nervous and Auditory   Lumbar back pain with radiculopathy affecting lower extremity   Relevant Medications   meloxicam (MOBIC) 7.5 MG tablet   cyclobenzaprine (FLEXERIL) 10 MG tablet   sertraline (ZOLOFT) 100 MG tablet     Other   Hyperlipidemia   Episodic mood disorder (HCC)   Relevant Medications   sertraline (ZOLOFT) 100 MG tablet    Other Visit Diagnoses    Superficial burn of abdominal wall, initial encounter           Meds ordered this encounter  Medications  . meloxicam (MOBIC) 7.5 MG tablet    Sig: Take 1 tablet (7.5 mg total) by mouth daily.    Dispense:  30 tablet    Refill:  0    Order Specific Question:   Supervising Provider    Answer:   Janora Norlander [[id number]]  . cyclobenzaprine (FLEXERIL) 10 MG tablet    Sig: Take 1 tablet (10 mg total) by mouth 3 (three) times daily as needed for muscle spasms.    Dispense:  60 tablet    Refill:  5    Order Specific Question:   Supervising Provider    Answer:   Janora Norlander [[id number]]  . sertraline (ZOLOFT) 100 MG tablet    Sig: Take 2 tablets (200 mg total) by mouth daily.    Dispense:  180 tablet    Refill:  3    Order Specific Question:   Supervising Provider    Answer:   Janora Norlander [[id number]]     Terald Sleeper, PA-C

## 2019-06-23 NOTE — Progress Notes (Signed)
corrected

## 2019-06-26 ENCOUNTER — Telehealth: Payer: Self-pay | Admitting: Physician Assistant

## 2019-06-26 NOTE — Telephone Encounter (Signed)
Left message to call back  

## 2019-06-27 NOTE — Telephone Encounter (Signed)
Patient viewed results on mychart.

## 2019-06-30 ENCOUNTER — Other Ambulatory Visit: Payer: Self-pay | Admitting: Physician Assistant

## 2019-06-30 ENCOUNTER — Telehealth: Payer: Self-pay | Admitting: Physician Assistant

## 2019-06-30 DIAGNOSIS — M5136 Other intervertebral disc degeneration, lumbar region: Secondary | ICD-10-CM

## 2019-06-30 DIAGNOSIS — M1712 Unilateral primary osteoarthritis, left knee: Secondary | ICD-10-CM

## 2019-06-30 DIAGNOSIS — M1711 Unilateral primary osteoarthritis, right knee: Secondary | ICD-10-CM

## 2019-06-30 DIAGNOSIS — M51369 Other intervertebral disc degeneration, lumbar region without mention of lumbar back pain or lower extremity pain: Secondary | ICD-10-CM | POA: Insufficient documentation

## 2019-06-30 NOTE — Telephone Encounter (Signed)
REFERRAL REQUEST Telephone Note  What type of referral do you need? Orthopedic   Have you been seen at our office for this problem? Yes - Patient was seen on 06/20/2019 and stated Ref was to be placed then.  (Advise that they will likely need an appointment with their PCP before a referral can be done)  Is there a particular doctor or location that you prefer? [facility]  Patient notified that referrals can take up to a week or longer to process. If they haven't heard anything within a week they should call back and speak with the referral department.

## 2019-06-30 NOTE — Telephone Encounter (Signed)
ordered

## 2019-07-03 ENCOUNTER — Telehealth: Payer: Self-pay | Admitting: Physician Assistant

## 2019-07-03 NOTE — Telephone Encounter (Signed)
Ref has been updated in proficient as well. Comment left for receiving office

## 2019-07-03 NOTE — Telephone Encounter (Signed)
I put both diagnoses in the referral information box and when I signed the referral we have to associate it with diagnoses.

## 2019-07-12 ENCOUNTER — Other Ambulatory Visit: Payer: Self-pay | Admitting: Physician Assistant

## 2019-07-12 DIAGNOSIS — M5417 Radiculopathy, lumbosacral region: Secondary | ICD-10-CM | POA: Diagnosis not present

## 2019-07-12 DIAGNOSIS — M25569 Pain in unspecified knee: Secondary | ICD-10-CM | POA: Diagnosis not present

## 2019-07-12 DIAGNOSIS — M5416 Radiculopathy, lumbar region: Secondary | ICD-10-CM | POA: Diagnosis not present

## 2019-07-12 DIAGNOSIS — E039 Hypothyroidism, unspecified: Secondary | ICD-10-CM

## 2019-07-18 ENCOUNTER — Ambulatory Visit: Payer: Medicaid Other | Admitting: Physician Assistant

## 2019-07-22 ENCOUNTER — Other Ambulatory Visit: Payer: Self-pay | Admitting: Physician Assistant

## 2019-07-22 DIAGNOSIS — M5416 Radiculopathy, lumbar region: Secondary | ICD-10-CM

## 2019-07-22 DIAGNOSIS — E109 Type 1 diabetes mellitus without complications: Secondary | ICD-10-CM

## 2019-07-31 ENCOUNTER — Telehealth: Payer: Self-pay | Admitting: Physician Assistant

## 2019-07-31 ENCOUNTER — Encounter: Payer: Self-pay | Admitting: Family

## 2019-07-31 ENCOUNTER — Ambulatory Visit (INDEPENDENT_AMBULATORY_CARE_PROVIDER_SITE_OTHER): Payer: Medicaid Other | Admitting: Family

## 2019-07-31 DIAGNOSIS — J4521 Mild intermittent asthma with (acute) exacerbation: Secondary | ICD-10-CM | POA: Diagnosis not present

## 2019-07-31 MED ORDER — SYMBICORT 160-4.5 MCG/ACT IN AERO: INHALATION_SPRAY | RESPIRATORY_TRACT | 1 refills | Status: DC

## 2019-07-31 MED ORDER — ALBUTEROL SULFATE (2.5 MG/3ML) 0.083% IN NEBU: INHALATION_SOLUTION | RESPIRATORY_TRACT | 2 refills | Status: DC

## 2019-07-31 MED ORDER — ALBUTEROL SULFATE HFA 108 (90 BASE) MCG/ACT IN AERS: 2.0000 | INHALATION_SPRAY | Freq: Four times a day (QID) | RESPIRATORY_TRACT | 1 refills | Status: DC | PRN

## 2019-07-31 MED ORDER — DEXAMETHASONE 6 MG PO TABS: 6.0000 mg | ORAL_TABLET | Freq: Every day | ORAL | 0 refills | Status: DC

## 2019-07-31 NOTE — Progress Notes (Signed)
Virtual Visit via telephone Note Due to COVID-19 pandemic this visit was conducted virtually. This visit type was conducted due to national recommendations for restrictions regarding the COVID-19 Pandemic (e.g. social distancing, sheltering in place) in an effort to limit this patient's exposure and mitigate transmission in our community. All issues noted in this document were discussed and addressed.  A physical exam was not performed with this format.  I connected with Judith [name] on 07/31/19 at 11:32 AM  by telephone and verified that I am speaking with the correct person using two identifiers. Judith [name] is currently located at home  and son and aunt  is currently with her during visit. The provider, Evelina Dun, FNP is located in their office at time of visit.  I discussed the limitations, risks, security and privacy concerns of performing an evaluation and management service by telephone and the availability of in person appointments. I also discussed with the patient that there may be a patient responsible charge related to this service. The patient expressed understanding and agreed to proceed.   History and Present Illness:  URI  This is a new problem. The current episode started yesterday. The problem has been unchanged. There has been no fever. Associated symptoms include congestion, coughing, headaches, rhinorrhea, sinus pain and wheezing. Pertinent negatives include no ear pain, sneezing or sore throat. She has tried inhaler use and antihistamine for the symptoms. The treatment provided mild relief.  Asthma She complains of cough, shortness of breath and wheezing. There is no difficulty breathing. This is a new problem. Associated symptoms include headaches and rhinorrhea. Pertinent negatives include no ear pain, sneezing or sore throat. Her past medical history is significant for asthma.      Review of Systems  HENT: Positive for congestion, rhinorrhea and sinus pain.  Negative for ear pain, sneezing and sore throat.   Respiratory: Positive for cough, shortness of breath and wheezing.   Neurological: Positive for headaches.     Observations/Objective: No SOB or distress noted   Assessment and Plan: 1. Mild intermittent asthma with acute exacerbation Rest Force fluids Restart Symbicort Work note given Call if symptoms worsen or not improve  - dexamethasone (DECADRON) 6 MG tablet; Take 1 tablet (6 mg total) by mouth daily.  Dispense: 5 tablet; Refill: 0 - albuterol (VENTOLIN HFA) 108 (90 Base) MCG/ACT inhaler; Inhale 2 puffs into the lungs every 6 (six) hours as needed for wheezing or shortness of breath.  Dispense: 8 g; Refill: 1 - albuterol (PROVENTIL) (2.5 MG/3ML) 0.083% nebulizer solution; Nebulize 1 vial three times daily  Dispense: 150 mL; Refill: 2 - SYMBICORT 160-4.5 MCG/ACT inhaler; TAKE 2 PUFFS BY MOUTH TWICE A DAY  Dispense: 1 Inhaler; Refill: 1 - For home use only DME Nebulizer machine     I discussed the assessment and treatment plan with the patient. The patient was provided an opportunity to ask questions and all were answered. The patient agreed with the plan and demonstrated an understanding of the instructions.   The patient was advised to call back or seek an in-person evaluation if the symptoms worsen or if the condition fails to improve as anticipated.  The above assessment and management plan was discussed with the patient. The patient verbalized understanding of and has agreed to the management plan. Patient is aware to call the clinic if symptoms persist or worsen. Patient is aware when to return to the clinic for a follow-up visit. Patient educated on when it is appropriate to  go to the emergency department.   Time call ended:  11:48 AM   I provided 16 minutes of non-face-to-face time during this encounter.    Evelina Dun, FNP

## 2019-08-01 ENCOUNTER — Other Ambulatory Visit: Payer: Self-pay | Admitting: Physician Assistant

## 2019-08-01 DIAGNOSIS — J4521 Mild intermittent asthma with (acute) exacerbation: Secondary | ICD-10-CM

## 2019-08-04 MED FILL — HYDROCHLOROTHIAZIDE 12.5MG TABS: 12.5 mg | ORAL | 90 days supply | Qty: 90 | Fill #0 | Status: AC

## 2019-08-04 MED FILL — UNITHROID 88MCG TABS: 88 ug | ORAL | 30 days supply | Qty: 30 | Fill #0 | Status: AC

## 2019-08-15 DIAGNOSIS — Z23 Encounter for immunization: Secondary | ICD-10-CM | POA: Diagnosis not present

## 2019-08-15 DIAGNOSIS — S61012A Laceration without foreign body of left thumb without damage to nail, initial encounter: Secondary | ICD-10-CM | POA: Diagnosis not present

## 2019-08-16 ENCOUNTER — Other Ambulatory Visit: Payer: Self-pay | Admitting: *Deleted

## 2019-08-16 MED ORDER — PIOGLITAZONE HCL 30 MG PO TABS: 30.0000 mg | ORAL_TABLET | Freq: Every day | ORAL | 0 refills | Status: DC

## 2019-08-18 DIAGNOSIS — J4521 Mild intermittent asthma with (acute) exacerbation: Secondary | ICD-10-CM | POA: Diagnosis not present

## 2019-08-19 ENCOUNTER — Emergency Department (INDEPENDENT_AMBULATORY_CARE_PROVIDER_SITE_OTHER): Payer: Medicaid Other

## 2019-08-19 ENCOUNTER — Emergency Department (INDEPENDENT_AMBULATORY_CARE_PROVIDER_SITE_OTHER)
Admission: EM | Admit: 2019-08-19 | Discharge: 2019-08-19 | Disposition: A | Discharge status: Home / Self Care | Payer: Medicaid Other | Source: Home / Self Care

## 2019-08-19 ENCOUNTER — Other Ambulatory Visit: Payer: Self-pay

## 2019-08-19 ENCOUNTER — Encounter: Payer: Self-pay | Admitting: Emergency Medicine

## 2019-08-19 DIAGNOSIS — R0602 Shortness of breath: Secondary | ICD-10-CM

## 2019-08-19 DIAGNOSIS — R05 Cough: Secondary | ICD-10-CM

## 2019-08-19 DIAGNOSIS — R079 Chest pain, unspecified: Secondary | ICD-10-CM

## 2019-08-19 DIAGNOSIS — J4521 Mild intermittent asthma with (acute) exacerbation: Secondary | ICD-10-CM

## 2019-08-19 DIAGNOSIS — R059 Cough, unspecified: Secondary | ICD-10-CM

## 2019-08-19 MED ORDER — METHYLPREDNISOLONE SODIUM SUCC 40 MG IJ SOLR
80.0000 mg | Freq: Once | INTRAMUSCULAR | Status: AC
  Administered 2019-08-19: 80 mg via INTRAMUSCULAR

## 2019-08-19 NOTE — Discharge Instructions (Signed)
You may take 500mg  acetaminophen every 4-6 hours or in combination with ibuprofen 400-600mg  every 6-8 hours as needed for pain, inflammation, and fever.  Be sure to well hydrated with clear liquids and get at least 8 hours of sleep at night, preferably more while sick.   Please follow up with family medicine later in the week if still not improving.  You will only be notified of Positive test results, usually in about 2 days.  You may view all lab and imaging results in your free  MyChart Account.  If you do not have an account, please follow the instructions in this discharge paperwork.  The code provided for new patients is only good for 1 month so be sure to activate as soon as possible.   Due to concern for possibly having Covid-19, it is advised that you self-isolate at home until test results come back.  If positive, it is recommended you stay isolated for at least 10 days after symptom onset, 24 hours after last fever without taking medication (whichever is longer) and symptoms improving.  If you MUST go out, please wear a mask at all times, limit contact with others.

## 2019-08-19 NOTE — ED Provider Notes (Signed)
[name]    CSN: [id number] Arrival date & time: 08/19/19  1318      History   Chief Complaint Chief Complaint  Patient presents with  . Shortness of Breath    HPI Judith [name] is a 49 y.o. female.   HPI  Judith [name] is a 49 y.o. female presenting to UC with c/o mild intermittent non-productive cough, chest tightness, and SOB 2 days ago, c/w asthma attack.  She has used her albuterol nebulizer and took oral steroids after completing a virtual visit but reports only minimal improvement. Symptoms feel similar to prior asthma attacks, pt believes triggered by recent weather changes and cold snap.  Denies fever, chills, n/v/d. No HA, sore throat, body aches, sick contacts or recent travel.  Pt is requesting a solumedrol injection as injected steroids tend to help much more for pt.   Pt has not received the covid vaccine yet.   Past Medical History:  Diagnosis Date  . Asthma   . Bipolar 1 disorder ([place])   . Depression   . GERD (gastroesophageal reflux disease)   . Hyperlipemia   . Hypothyroid   . Migraines     Patient Active Problem List   Diagnosis Date Noted  . DDD (degenerative disc disease), lumbar 06/30/2019  . Lumbar back pain with radiculopathy affecting lower extremity 06/20/2019  . 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/21/2019  . Type 1 diabetes mellitus with hyperglycemia ([place]) 03/21/2019  . 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 Acute cystitis without hematuria 03/21/2018  . History of left knee replacement 02/10/2018  . Morbid obesity ([place]) 11/21/2017  . Primary osteoarthritis of left knee 09/22/2017  . Primary osteoarthritis of right knee 09/22/2017  . 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 Episodic mood disorder ([place]) 05/12/2016  . Gastroesophageal reflux disease without esophagitis 05/12/2016  . DYSPNEA 07/22/2010  . CHEST PAIN 07/22/2010  . Hypothyroidism 09/13/2007  . Hyperlipidemia 09/13/2007    . ANXIETY DEPRESSION 09/13/2007  . MIGRAINE HEADACHE 09/13/2007  . Essential hypertension 09/13/2007  . ASTHMA 09/13/2007  . CHOLELITHIASIS, HX OF 09/13/2007  . NEPHROLITHIASIS, HX OF 09/13/2007  . CARPAL TUNNEL RELEASE, HX OF 09/13/2007    Past Surgical History:  Procedure Laterality Date  . ABDOMINAL SURGERY    . CARPAL TUNNEL RELEASE Bilateral 1999  . CESAREAN SECTION    . CHOLECYSTECTOMY    . THYROIDECTOMY    . TONSILLECTOMY      OB History    Gravida  1   Para  1   Term  1   Preterm      AB      Living        SAB      TAB      Ectopic      Multiple      Live Births               Home Medications    Prior to Admission medications   Medication Sig Start Date End Date Taking? Authorizing Provider  Accu-Chek FastClix Lancets MISC Test blood sugars twice daily Dx E11.9 02/06/19  Yes Terald Sleeper, PA-C  albuterol (PROVENTIL) (2.5 MG/3ML) 0.083% nebulizer solution Nebulize 1 vial three times daily 07/31/19  Yes Hawks, Christy A, FNP  atorvastatin (LIPITOR) 10 MG tablet Take 1 tablet (10 mg total) by mouth daily. 11/16/18  Yes Terald Sleeper, PA-C  cyclobenzaprine (FLEXERIL) 10 MG tablet Take 1 tablet (10 mg total)  by mouth 3 (three) times daily as needed for muscle spasms. 06/20/19  Yes Terald Sleeper, PA-C  diclofenac sodium (VOLTAREN) 1 % GEL Apply 4 g topically 4 (four) times daily. 11/16/18  Yes Terald Sleeper, PA-C  Insulin Glargine (LANTUS SOLOSTAR) 100 UNIT/ML Solostar Pen Inject 20-50 Units into the skin daily. 01/17/19  Yes Terald Sleeper, PA-C  JANUVIA 50 MG tablet TAKE 1 TABLET DAILY 07/24/19  Yes Terald Sleeper, PA-C  lamoTRIgine (LAMICTAL) 150 MG tablet Take 1 tablet (150 mg total) by mouth 2 (two) times daily. 11/16/18  Yes Terald Sleeper, PA-C  levothyroxine (SYNTHROID) 125 MCG tablet TAKE 1 TABLET BY MOUTH EVERY DAY 07/13/19  Yes Terald Sleeper, PA-C  lisdexamfetamine (VYVANSE) 70 MG capsule Take 1 capsule (70 mg total) by mouth daily. 02/20/19  Yes  Terald Sleeper, PA-C  lisdexamfetamine (VYVANSE) 70 MG capsule Take 1 capsule (70 mg total) by mouth daily. 05/22/19  Yes Terald Sleeper, PA-C  lisinopril (ZESTRIL) 20 MG tablet Take 1 tablet (20 mg total) by mouth daily. 11/16/18  Yes Terald Sleeper, PA-C  meloxicam (MOBIC) 7.5 MG tablet TAKE 1 TABLET DAILY 07/24/19  Yes Terald Sleeper, PA-C  montelukast (SINGULAIR) 10 MG tablet TAKE ONE TAB BY MOUTH AT BEDTIME 02/27/19  Yes Terald Sleeper, PA-C  pantoprazole (PROTONIX) 40 MG tablet TAKE ONE TAB BY MOUTH 2 TIMES DAILY 11/16/18  Yes Terald Sleeper, PA-C  pioglitazone (ACTOS) 30 MG tablet Take 1 tablet (30 mg total) by mouth daily. 08/16/19  Yes Hendricks Limes F, FNP  sertraline (ZOLOFT) 100 MG tablet Take 2 tablets (200 mg total) by mouth daily. 06/20/19  Yes Terald Sleeper, PA-C  SYMBICORT 160-4.5 MCG/ACT inhaler TAKE 2 PUFFS BY MOUTH TWICE A DAY 07/31/19  Yes Hawks, Christy A, FNP  traMADol (ULTRAM) 50 MG tablet Take 1 tablet (50 mg total) by mouth every 8 (eight) hours as needed for severe pain. 03/21/19  Yes Terald Sleeper, PA-C  ACCU-CHEK GUIDE test strip CHECK BLOOD SUGARS TWICE A DAY AND AS DIRECTED 02/17/19   Terald Sleeper, PA-C  albuterol (VENTOLIN HFA) 108 (90 Base) MCG/ACT inhaler Inhale 2 puffs into the lungs every 6 (six) hours as needed for wheezing or shortness of breath. 07/31/19   Sharion Balloon, FNP  blood glucose meter kit and supplies Dispense based on patient and insurance preference. Check blood sugar bid and as needed. Dx: E11.9 01/17/19   Terald Sleeper, PA-C  cetirizine (ZYRTEC) 10 MG tablet Take 1 tablet (10 mg total) by mouth daily. 11/16/18   Terald Sleeper, PA-C  dexamethasone (DECADRON) 6 MG tablet Take 1 tablet (6 mg total) by mouth daily. 07/31/19   Sharion Balloon, FNP  dicyclomine (BENTYL) 20 MG tablet Take 1 tablet (20 mg total) by mouth every 6 (six) hours. 01/04/18   Terald Sleeper, PA-C  fluconazole (DIFLUCAN) 150 MG tablet TAKE ONE TAB BY MOUTH EACH WEEK 05/22/19   Terald Sleeper,  PA-C  hydrOXYzine (ATARAX/VISTARIL) 25 MG tablet Take 1 tablet (25 mg total) by mouth 3 (three) times daily. 11/16/18   Terald Sleeper, PA-C  Insulin Pen Needle 31G X 5 MM MISC 1 Units by Does not apply route daily. 01/17/19   Terald Sleeper, PA-C  lisdexamfetamine (VYVANSE) 70 MG capsule Take 1 capsule (70 mg total) by mouth daily. 02/20/19   Terald Sleeper, PA-C  silver sulfADIAZINE (SILVADENE) 1 % cream Apply 1 application topically  daily. 06/09/19   Chevis Pretty, FNP    Family History Family History  Problem Relation Age of Onset  . Mental illness Other   . Diabetes Other   . Healthy Mother     Social History Social History   Tobacco Use  . Smoking status: Former Research scientist (life sciences)  . Smokeless tobacco: Never Used  Substance Use Topics  . Alcohol use: No  . Drug use: No     Allergies   Diphenhydramine, Morphine, Prednisone, Erythromycin, and Sulfonamide derivatives   Review of Systems Review of Systems  Constitutional: Negative for chills and fever.  HENT: Negative for congestion, ear pain, sore throat, trouble swallowing and voice change.   Respiratory: Positive for cough, chest tightness, shortness of breath and wheezing.   Cardiovascular: Negative for chest pain and palpitations.  Gastrointestinal: Negative for abdominal pain, diarrhea, nausea and vomiting.  Musculoskeletal: Negative for arthralgias, back pain and myalgias.  Skin: Negative for rash.  All other systems reviewed and are negative.    Physical Exam Triage Vital Signs ED Triage Vitals  Enc Vitals Group     BP 08/19/19 1344 136/88     Pulse Rate 08/19/19 1344 88     Resp 08/19/19 1344 20     Temp 08/19/19 1344 98.8 F (37.1 C)     Temp Source 08/19/19 1344 Oral     SpO2 08/19/19 1344 96 %     Weight --      Height --      Head Circumference --      Peak Flow --      Pain Score 08/19/19 1348 0     Pain Loc --      Pain Edu? --      Excl. in [place]? --    No data found.  Updated Vital Signs BP  136/88 (BP Location: Right Arm)   Pulse 88   Temp 98.8 F (37.1 C) (Oral)   Resp 20   SpO2 96%   Visual Acuity Right Eye Distance:   Left Eye Distance:   Bilateral Distance:    Right Eye Near:   Left Eye Near:    Bilateral Near:     Physical Exam Vitals and nursing note reviewed.  Constitutional:      General: She is not in acute distress.    Appearance: She is well-developed. She is obese. She is not ill-appearing, toxic-appearing or diaphoretic.  HENT:     Head: Normocephalic and atraumatic.     Right Ear: Tympanic membrane and ear canal normal.     Left Ear: Tympanic membrane and ear canal normal.     Nose: Nose normal.     Right Sinus: No maxillary sinus tenderness or frontal sinus tenderness.     Left Sinus: No maxillary sinus tenderness or frontal sinus tenderness.     Mouth/Throat:     Lips: Pink.     Mouth: Mucous membranes are moist.     Pharynx: Oropharynx is clear. Uvula midline.  Cardiovascular:     Rate and Rhythm: Normal rate and regular rhythm.  Pulmonary:     Effort: Pulmonary effort is normal.     Breath sounds: Examination of the right-lower field reveals decreased breath sounds. Examination of the left-lower field reveals decreased breath sounds. Decreased breath sounds present. No wheezing, rhonchi or rales.  Musculoskeletal:        General: Normal range of motion.     Cervical back: Normal range of motion.  Skin:    General:  Skin is warm and dry.  Neurological:     Mental Status: She is alert and oriented to person, place, and time.  Psychiatric:        Behavior: Behavior normal.      UC Treatments / Results  Labs (all labs ordered are listed, but only abnormal results are displayed) Labs Reviewed  NOVEL CORONAVIRUS, NAA    EKG   Radiology DG Chest 2 View  Result Date: 08/19/2019 CLINICAL DATA:  Chest pain with cough and shortness of breath EXAM: CHEST - 2 VIEW COMPARISON:  November 02, 2018 FINDINGS: There is minimal scarring in the left  base. The lungs elsewhere are clear. Heart size and pulmonary vascularity are normal. No adenopathy. No pneumothorax. No bone lesions. IMPRESSION: Slight scarring left base. Lungs elsewhere clear. Cardiac silhouette within normal limits. Chest is Electronically Signed   By: Lowella Grip III M.D.   On: 08/19/2019 14:19    Procedures Procedures (including critical care time)  Medications Ordered in UC Medications  methylPREDNISolone sodium succinate (SOLU-MEDROL) 40 mg/mL injection 80 mg (80 mg Intramuscular Given 08/19/19 1419)    Initial Impression / Assessment and Plan / UC Course  I have reviewed the triage vital signs and the nursing notes.  Pertinent labs & imaging results that were available during my care of the patient were reviewed by me and considered in my medical decision making (see chart for details).     Decreased breath sounds in lower lung fields but no respiratory distress on exam. O2 Sat 96% on RA, HR- 88bpm Pt agreeable for Covid-19 test Solumedrol '80mg'$  IM given in UC AVS provided  Final Clinical Impressions(s) / UC Diagnoses   Final diagnoses:  Cough  SOB (shortness of breath)  Mild intermittent asthma with exacerbation     Discharge Instructions     You may take '500mg'$  acetaminophen every 4-6 hours or in combination with ibuprofen 400-'600mg'$  every 6-8 hours as needed for pain, inflammation, and fever.  Be sure to well hydrated with clear liquids and get at least 8 hours of sleep at night, preferably more while sick.   Please follow up with family medicine later in the week if still not improving.  You will only be notified of Positive test results, usually in about 2 days.  You may view all lab and imaging results in your free [facility] MyChart Account.  If you do not have an account, please follow the instructions in this discharge paperwork.  The code provided for new patients is only good for 1 month so be sure to activate as soon as possible.    Due to concern for possibly having Covid-19, it is advised that you self-isolate at home until test results come back.  If positive, it is recommended you stay isolated for at least 10 days after symptom onset, 24 hours after last fever without taking medication (whichever is longer) and symptoms improving.  If you MUST go out, please wear a mask at all times, limit contact with others.       ED Prescriptions    None     PDMP not reviewed this encounter.   Noe Gens, [place] 08/19/19 1507

## 2019-08-19 NOTE — ED Triage Notes (Signed)
C/o dry NP cough- woke up on Thursday morning with asthma attack Picked up albuterol nebulizer yesterday Did one treatment - min relief Finished a course of steroids one week ago

## 2019-08-20 LAB — SARS-COV-2, NAA 2 DAY TAT

## 2019-08-20 LAB — NOVEL CORONAVIRUS, NAA: SARS-CoV-2, NAA: NOT DETECTED

## 2019-08-22 ENCOUNTER — Other Ambulatory Visit: Payer: Self-pay | Admitting: *Deleted

## 2019-08-22 DIAGNOSIS — F39 Unspecified mood [affective] disorder: Secondary | ICD-10-CM

## 2019-08-22 MED ORDER — LAMOTRIGINE 150 MG PO TABS: 150.0000 mg | ORAL_TABLET | Freq: Two times a day (BID) | ORAL | 2 refills | Status: DC

## 2019-09-01 ENCOUNTER — Ambulatory Visit: Payer: Medicaid Other | Admitting: Family Medicine

## 2019-09-01 ENCOUNTER — Ambulatory Visit: Payer: Medicaid Other | Admitting: Physician Assistant

## 2019-09-05 ENCOUNTER — Telehealth: Payer: Self-pay | Admitting: Family Medicine

## 2019-09-11 ENCOUNTER — Other Ambulatory Visit: Payer: Self-pay | Admitting: *Deleted

## 2019-09-11 DIAGNOSIS — M5416 Radiculopathy, lumbar region: Secondary | ICD-10-CM

## 2019-09-11 MED ORDER — MELOXICAM 7.5 MG PO TABS: 7.5000 mg | ORAL_TABLET | Freq: Every day | ORAL | 0 refills | Status: DC

## 2019-09-13 MED FILL — SUPREP BOWEL PREP SOL (354ML=1BOX): 17.5-3.13-1.6 GM/177ML | ORAL | 1 days supply | Qty: 354 | Fill #0 | Status: AC

## 2019-09-14 ENCOUNTER — Telehealth (INDEPENDENT_AMBULATORY_CARE_PROVIDER_SITE_OTHER): Payer: Medicaid Other | Admitting: Family Medicine

## 2019-09-14 DIAGNOSIS — Z5329 Procedure and treatment not carried out because of patient's decision for other reasons: Secondary | ICD-10-CM

## 2019-09-14 NOTE — Progress Notes (Signed)
Attempted to reach patient several times.  She will need to reschedule appt. Please make in office for face to face visit if needing controlled substances.  Start time: 3:47pm (text sent); 3:51 (second text sent); 3:55pm (called no answer) End time: disconnected video at 3:56pm  [place], [place] [phone]

## 2019-09-19 ENCOUNTER — Encounter: Payer: Self-pay | Admitting: Family

## 2019-09-19 ENCOUNTER — Ambulatory Visit: Payer: Medicaid Other | Admitting: Family

## 2019-09-19 ENCOUNTER — Other Ambulatory Visit: Payer: Self-pay

## 2019-09-19 VITALS — BP 110/80 | HR 88 | Temp 97.2°F | Ht 68.0 in | Wt 292.0 lb

## 2019-09-19 DIAGNOSIS — E039 Hypothyroidism, unspecified: Secondary | ICD-10-CM

## 2019-09-19 DIAGNOSIS — M1711 Unilateral primary osteoarthritis, right knee: Secondary | ICD-10-CM

## 2019-09-19 DIAGNOSIS — F988 Other specified behavioral and emotional disorders with onset usually occurring in childhood and adolescence: Secondary | ICD-10-CM

## 2019-09-19 DIAGNOSIS — M5416 Radiculopathy, lumbar region: Secondary | ICD-10-CM

## 2019-09-19 DIAGNOSIS — K219 Gastro-esophageal reflux disease without esophagitis: Secondary | ICD-10-CM

## 2019-09-19 DIAGNOSIS — I10 Essential (primary) hypertension: Secondary | ICD-10-CM | POA: Diagnosis not present

## 2019-09-19 DIAGNOSIS — F39 Unspecified mood [affective] disorder: Secondary | ICD-10-CM | POA: Diagnosis not present

## 2019-09-19 DIAGNOSIS — F321 Major depressive disorder, single episode, moderate: Secondary | ICD-10-CM

## 2019-09-19 DIAGNOSIS — E785 Hyperlipidemia, unspecified: Secondary | ICD-10-CM

## 2019-09-19 DIAGNOSIS — E118 Type 2 diabetes mellitus with unspecified complications: Secondary | ICD-10-CM

## 2019-09-19 DIAGNOSIS — Z79899 Other long term (current) drug therapy: Secondary | ICD-10-CM | POA: Diagnosis not present

## 2019-09-19 DIAGNOSIS — J45909 Unspecified asthma, uncomplicated: Secondary | ICD-10-CM

## 2019-09-19 DIAGNOSIS — Z794 Long term (current) use of insulin: Secondary | ICD-10-CM

## 2019-09-19 MED ORDER — PANTOPRAZOLE SODIUM 40 MG PO TBEC: DELAYED_RELEASE_TABLET | ORAL | 3 refills | Status: DC

## 2019-09-19 MED ORDER — DICYCLOMINE HCL 20 MG PO TABS: 20.0000 mg | ORAL_TABLET | Freq: Four times a day (QID) | ORAL | 5 refills | Status: DC

## 2019-09-19 MED ORDER — SERTRALINE HCL 100 MG PO TABS: 200.0000 mg | ORAL_TABLET | Freq: Every day | ORAL | 3 refills | Status: DC

## 2019-09-19 MED ORDER — LISDEXAMFETAMINE DIMESYLATE 70 MG PO CAPS: 70.0000 mg | ORAL_CAPSULE | Freq: Every day | ORAL | 0 refills | Status: DC

## 2019-09-19 MED ORDER — CYCLOBENZAPRINE HCL 10 MG PO TABS: 10.0000 mg | ORAL_TABLET | Freq: Three times a day (TID) | ORAL | 5 refills | Status: DC | PRN

## 2019-09-19 MED ORDER — PIOGLITAZONE HCL 30 MG PO TABS: 30.0000 mg | ORAL_TABLET | Freq: Every day | ORAL | 0 refills | Status: DC

## 2019-09-19 MED ORDER — TRAMADOL HCL 50 MG PO TABS: 50.0000 mg | ORAL_TABLET | Freq: Three times a day (TID) | ORAL | 5 refills | Status: DC | PRN

## 2019-09-19 MED ORDER — LISINOPRIL 20 MG PO TABS: 20.0000 mg | ORAL_TABLET | Freq: Every day | ORAL | 3 refills | Status: DC

## 2019-09-19 MED ORDER — MONTELUKAST SODIUM 10 MG PO TABS: ORAL_TABLET | ORAL | 3 refills | Status: DC

## 2019-09-19 MED ORDER — LEVOTHYROXINE SODIUM 125 MCG PO TABS: 125.0000 ug | ORAL_TABLET | Freq: Every day | ORAL | 1 refills | Status: DC

## 2019-09-19 MED ORDER — ATORVASTATIN CALCIUM 10 MG PO TABS: 10.0000 mg | ORAL_TABLET | Freq: Every day | ORAL | 3 refills | Status: DC

## 2019-09-19 MED ORDER — MELOXICAM 7.5 MG PO TABS: 7.5000 mg | ORAL_TABLET | Freq: Every day | ORAL | 0 refills | Status: DC

## 2019-09-19 MED ORDER — LANTUS SOLOSTAR 100 UNIT/ML ~~LOC~~ SOPN: 20.0000 [IU] | PEN_INJECTOR | Freq: Every day | SUBCUTANEOUS | 99 refills | Status: DC

## 2019-09-19 MED ORDER — SITAGLIPTIN PHOSPHATE 50 MG PO TABS: 50.0000 mg | ORAL_TABLET | Freq: Every day | ORAL | 2 refills | Status: DC

## 2019-09-19 MED ORDER — LAMOTRIGINE 150 MG PO TABS: 150.0000 mg | ORAL_TABLET | Freq: Two times a day (BID) | ORAL | 2 refills | Status: DC

## 2019-09-19 MED FILL — VYVANSE 60MG CAPS: 60 mg | ORAL | 30 days supply | Qty: 30 | Fill #0 | Status: AC

## 2019-09-19 NOTE — Patient Instructions (Signed)
Health Maintenance, Female Adopting a healthy lifestyle and getting preventive care are important in promoting health and wellness. Ask your health care provider about:  The right schedule for you to have regular tests and exams.  Things you can do on your own to prevent diseases and keep yourself healthy. What should I know about diet, weight, and exercise? Eat a healthy diet   Eat a diet that includes plenty of vegetables, fruits, low-fat dairy products, and lean protein.  Do not eat a lot of foods that are high in solid fats, added sugars, or sodium. Maintain a healthy weight Body mass index (BMI) is used to identify weight problems. It estimates body fat based on height and weight. Your health care provider can help determine your BMI and help you achieve or maintain a healthy weight. Get regular exercise Get regular exercise. This is one of the most important things you can do for your health. Most adults should:  Exercise for at least 150 minutes each week. The exercise should increase your heart rate and make you sweat (moderate-intensity exercise).  Do strengthening exercises at least twice a week. This is in addition to the moderate-intensity exercise.  Spend less time sitting. Even light physical activity can be beneficial. Watch cholesterol and blood lipids Have your blood tested for lipids and cholesterol at 50 years of age, then have this test every 5 years. Have your cholesterol levels checked more often if:  Your lipid or cholesterol levels are high.  You are older than 50 years of age.  You are at high risk for heart disease. What should I know about cancer screening? Depending on your health history and family history, you may need to have cancer screening at various ages. This may include screening for:  Breast cancer.  Cervical cancer.  Colorectal cancer.  Skin cancer.  Lung cancer. What should I know about heart disease, diabetes, and high blood  pressure? Blood pressure and heart disease  High blood pressure causes heart disease and increases the risk of stroke. This is more likely to develop in people who have high blood pressure readings, are of African descent, or are overweight.  Have your blood pressure checked: ? Every 3-5 years if you are 18-39 years of age. ? Every year if you are 40 years old or older. Diabetes Have regular diabetes screenings. This checks your fasting blood sugar level. Have the screening done:  Once every three years after age 40 if you are at a normal weight and have a low risk for diabetes.  More often and at a younger age if you are overweight or have a high risk for diabetes. What should I know about preventing infection? Hepatitis B If you have a higher risk for hepatitis B, you should be screened for this virus. Talk with your health care provider to find out if you are at risk for hepatitis B infection. Hepatitis C Testing is recommended for:  Everyone born from 1945 through 1965.  Anyone with known risk factors for hepatitis C. Sexually transmitted infections (STIs)  Get screened for STIs, including gonorrhea and chlamydia, if: ? You are sexually active and are younger than 50 years of age. ? You are older than 50 years of age and your health care provider tells you that you are at risk for this type of infection. ? Your sexual activity has changed since you were last screened, and you are at increased risk for chlamydia or gonorrhea. Ask your health care provider if   you are at risk.  Ask your health care provider about whether you are at high risk for HIV. Your health care provider may recommend a prescription medicine to help prevent HIV infection. If you choose to take medicine to prevent HIV, you should first get tested for HIV. You should then be tested every 3 months for as long as you are taking the medicine. Pregnancy  If you are about to stop having your period (premenopausal) and  you may become pregnant, seek counseling before you get pregnant.  Take 400 to 800 micrograms (mcg) of folic acid every day if you become pregnant.  Ask for birth control (contraception) if you want to prevent pregnancy. Osteoporosis and menopause Osteoporosis is a disease in which the bones lose minerals and strength with aging. This can result in bone fractures. If you are 65 years old or older, or if you are at risk for osteoporosis and fractures, ask your health care provider if you should:  Be screened for bone loss.  Take a calcium or vitamin D supplement to lower your risk of fractures.  Be given hormone replacement therapy (HRT) to treat symptoms of menopause. Follow these instructions at home: Lifestyle  Do not use any products that contain nicotine or tobacco, such as cigarettes, e-cigarettes, and chewing tobacco. If you need help quitting, ask your health care provider.  Do not use street drugs.  Do not share needles.  Ask your health care provider for help if you need support or information about quitting drugs. Alcohol use  Do not drink alcohol if: ? Your health care provider tells you not to drink. ? You are pregnant, may be pregnant, or are planning to become pregnant.  If you drink alcohol: ? Limit how much you use to 0-1 drink a day. ? Limit intake if you are breastfeeding.  Be aware of how much alcohol is in your drink. In the [place] one drink equals one 12 oz bottle of beer (355 mL), one 5 oz glass of wine (148 mL), or one 1 oz glass of hard liquor (44 mL). General instructions  Schedule regular health, dental, and eye exams.  Stay current with your vaccines.  Tell your health care provider if: ? You often feel depressed. ? You have ever been abused or do not feel safe at home. Summary  Adopting a healthy lifestyle and getting preventive care are important in promoting health and wellness.  Follow your health care provider's instructions about healthy  diet, exercising, and getting tested or screened for diseases.  Follow your health care provider's instructions on monitoring your cholesterol and blood pressure. This information is not intended to replace advice given to you by your health care provider. Make sure you discuss any questions you have with your health care provider. Document Revised: 04/27/2018 Document Reviewed: 04/27/2018 Elsevier Patient Education  2020 [employer]  

## 2019-09-19 NOTE — Progress Notes (Signed)
Subjective:    Patient ID: Judith [name], female    DOB: 1970-08-27, 49 y.o.   MRN: [id number]  Chief Complaint  Patient presents with  . Medical Management of Chronic Issues   Pt presents to the office today for chronic follow up.  Hypertension This is a chronic problem. The current episode started more than 1 year ago. The problem has been resolved since onset. The problem is controlled. Associated symptoms include anxiety and peripheral edema. Pertinent negatives include no blurred vision. Risk factors for coronary artery disease include dyslipidemia, diabetes mellitus, obesity and sedentary lifestyle. The current treatment provides moderate improvement. Identifiable causes of hypertension include a thyroid problem.  Diabetes She presents for her follow-up diabetic visit. She has type 2 diabetes mellitus. Her disease course has been stable. Hypoglycemia symptoms include nervousness/anxiousness. Associated symptoms include fatigue and foot paresthesias. Pertinent negatives for diabetes include no blurred vision. Symptoms are stable. Diabetic complications include peripheral neuropathy. Risk factors for coronary artery disease include dyslipidemia, diabetes mellitus, female sex, hypertension, sedentary lifestyle and post-menopausal. She is following a generally unhealthy diet. Her overall blood glucose range is 110-130 mg/dl. Eye exam is current.  Thyroid Problem Presents for follow-up visit. Symptoms include anxiety, constipation, dry skin and fatigue. Patient reports no depressed mood, diaphoresis or diarrhea. The symptoms have been stable. Her past medical history is significant for hyperlipidemia.  Back Pain This is a chronic problem. The current episode started more than 1 year ago. The problem occurs intermittently. The problem has been waxing and waning since onset. The pain is present in the lumbar spine. The quality of the pain is described as aching. The pain is at a severity of 7/10. The  pain is moderate. She has tried muscle relaxant and NSAIDs for the symptoms. The treatment provided moderate relief.  Hyperlipidemia This is a chronic problem. The current episode started more than 1 year ago. The problem is controlled. Recent lipid tests were reviewed and are normal. Exacerbating diseases include obesity. Current antihyperlipidemic treatment includes statins. The current treatment provides moderate improvement of lipids. Risk factors for coronary artery disease include dyslipidemia, hypertension, a sedentary lifestyle and post-menopausal.  Asthma She complains of cough and wheezing. This is a chronic problem. The current episode started more than 1 year ago. The problem has been waxing and waning. Her past medical history is significant for asthma.  Anxiety Presents for follow-up visit. Symptoms include excessive worry, irritability, nervous/anxious behavior and restlessness. Patient reports no depressed mood. Symptoms occur occasionally. The severity of symptoms is moderate. The quality of sleep is good.   Her past medical history is significant for asthma.  Depression        This is a chronic problem.  The current episode started more than 1 year ago.   The onset quality is gradual.   The problem occurs intermittently.  Associated symptoms include fatigue and restlessness.  Past medical history includes thyroid problem and anxiety.   Arthritis Presents for follow-up visit. She complains of pain and stiffness. The symptoms have been stable. Affected locations include the right knee and left knee. Her pain is at a severity of 7/10. Associated symptoms include fatigue. Pertinent negatives include no diarrhea.  ADHD  Pt taking vyvanse 70 mg daily, but has not taken in 3 weeks. She reports this helps her stay focused when working and driving. She reports has a difficult time staying on task.     Review of Systems  Constitutional: Positive for fatigue and irritability. Negative  for  diaphoresis.  Eyes: Negative for blurred vision.  Respiratory: Positive for cough and wheezing.   Gastrointestinal: Positive for constipation. Negative for diarrhea.  Musculoskeletal: Positive for arthritis, back pain and stiffness.  Psychiatric/Behavioral: Positive for depression. The patient is nervous/anxious.   All other systems reviewed and are negative.      Objective:   Physical Exam Vitals reviewed.  Constitutional:      General: She is not in acute distress.    Appearance: She is well-developed. She is obese.  HENT:     Head: Normocephalic and atraumatic.     Right Ear: Tympanic membrane normal.     Left Ear: Tympanic membrane normal.  Eyes:     Pupils: Pupils are equal, round, and reactive to light.  Neck:     Thyroid: No thyromegaly.  Cardiovascular:     Rate and Rhythm: Normal rate and regular rhythm.     Heart sounds: Normal heart sounds. No murmur.  Pulmonary:     Effort: Pulmonary effort is normal. No respiratory distress.     Breath sounds: Normal breath sounds. No wheezing.  Abdominal:     General: Bowel sounds are normal. There is no distension.     Palpations: Abdomen is soft.     Tenderness: There is no abdominal tenderness.  Musculoskeletal:        General: No tenderness. Normal range of motion.     Cervical back: Normal range of motion and neck supple.  Skin:    General: Skin is warm and dry.  Neurological:     Mental Status: She is alert and oriented to person, place, and time.     Cranial Nerves: No cranial nerve deficit.     Deep Tendon Reflexes: Reflexes are normal and symmetric.  Psychiatric:        Behavior: Behavior normal.        Thought Content: Thought content normal.        Judgment: Judgment normal.       BP 110/80   Pulse 88   Temp (!) 97.2 F (36.2 C) (Temporal)   Ht 5\' 8"  (1.727 m)   Wt 292 lb (132.5 kg)   SpO2 95%   BMI 44.40 kg/m      Assessment & Plan:  Judith [name] comes in today with chief complaint of Medical  Management of Chronic Issues   Diagnosis and orders addressed:  1. Primary osteoarthritis of right knee - traMADol (ULTRAM) 50 MG tablet; Take 1 tablet (50 mg total) by mouth every 8 (eight) hours as needed for severe pain.  Dispense: 90 tablet; Refill: 5  2. Lumbar back pain with radiculopathy affecting lower extremity - cyclobenzaprine (FLEXERIL) 10 MG tablet; Take 1 tablet (10 mg total) by mouth 3 (three) times daily as needed for muscle spasms.  Dispense: 60 tablet; Refill: 5 - meloxicam (MOBIC) 7.5 MG tablet; Take 1 tablet (7.5 mg total) by mouth daily.  Dispense: 30 tablet; Refill: 0  3. Hypothyroidism, unspecified type - levothyroxine (SYNTHROID) 125 MCG tablet; Take 1 tablet (125 mcg total) by mouth daily.  Dispense: 90 tablet; Refill: 1  4. Essential hypertension - lisinopril (ZESTRIL) 20 MG tablet; Take 1 tablet (20 mg total) by mouth daily.  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  Dispense: 30 capsule; Refill:  0 - lisdexamfetamine (VYVANSE) 70 MG capsule; Take 1 capsule (70 mg total) by mouth daily.  Dispense: 30 capsule; Refill: 0 - lisdexamfetamine (VYVANSE) 70 MG capsule; Take 1 capsule (70 mg total) by mouth daily.  Dispense: 30 capsule; Refill: 0  6. Episodic mood disorder (HCC) - lamoTRIgine (LAMICTAL) 150 MG tablet; Take 1 tablet (150 mg total) by mouth 2 (two) times daily.  Dispense: 60 tablet; Refill: 2 - sertraline (ZOLOFT) 100 MG tablet; Take 2 tablets (200 mg total) by mouth daily.  Dispense: 180 tablet; Refill: 3  7. Gastroesophageal reflux disease without esophagitis - pantoprazole (PROTONIX) 40 MG tablet; TAKE ONE TAB BY MOUTH 2 TIMES DAILY  Dispense: 180 tablet; Refill: 3  8. Controlled type 2 diabetes mellitus with complication, with  long-term current use of insulin (HCC) - pioglitazone (ACTOS) 30 MG tablet; Take 1 tablet (30 mg total) by mouth daily.  Dispense: 90 tablet; Refill: 0 - sitaGLIPtin (JANUVIA) 50 MG tablet; Take 1 tablet (50 mg total) by mouth daily.  Dispense: 30 tablet; Refill: 2 - insulin glargine (LANTUS SOLOSTAR) 100 UNIT/ML Solostar Pen; Inject 20-50 Units into the skin daily.  Dispense: 5 pen; Refill: PRN - Bayer DCA Hb A1c Waived  9. Hyperlipidemia, unspecified hyperlipidemia type - atorvastatin (LIPITOR) 10 MG tablet; Take 1 tablet (10 mg total) by mouth daily.  Dispense: 90 tablet; Refill: 3  10. Morbid obesity ([place])   11. Moderate asthma without complication, unspecified whether persistent - montelukast (SINGULAIR) 10 MG tablet; TAKE ONE TAB BY MOUTH AT BEDTIME  Dispense: 90 tablet; Refill: 3  12. Controlled substance agreement signed  13. Depression, major, single episode, moderate ([place])   Labs pending Pt reviewed in [place] controlled database- no red flags noted, contract and urine drug up dated today, pt reports she has not had her Vyvanse in 3 weeks Health Maintenance reviewed- Requested Diabetic eye exam  Diet and exercise encouraged  Follow up plan: 3 months with Dr. Paulla Fore, FNP

## 2019-09-20 ENCOUNTER — Encounter

## 2019-09-20 ENCOUNTER — Telehealth: Payer: Self-pay | Admitting: *Deleted

## 2019-09-20 IMAGING — CT CT HEAD WITHOUT CONTRAST
4 series · 17 of 47 positions shown, 19 images · non-contrast
Comparison: None.

CLINICAL DATA: Headache

EXAM:
CT HEAD WITHOUT CONTRAST
TECHNIQUE: Contiguous axial images were obtained from the base of the skull
through the vertex without intravenous contrast.

[Series 3: head without · axial · non-contrast · 0.46mm/px · z∈[+1427,+1562]mm · 7 of 37 slices shown, 9 images]
[im 5/37  brain]
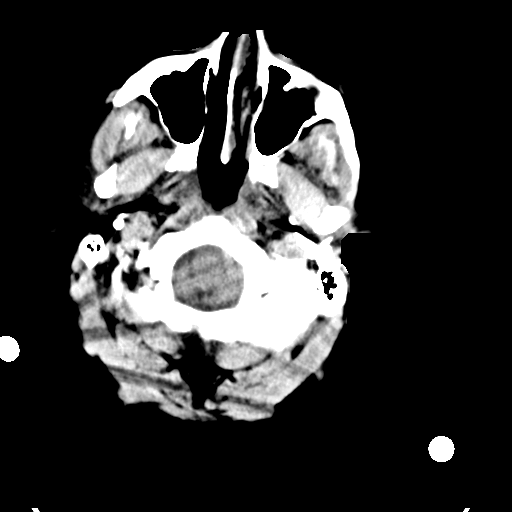
[im 5/37  bone]
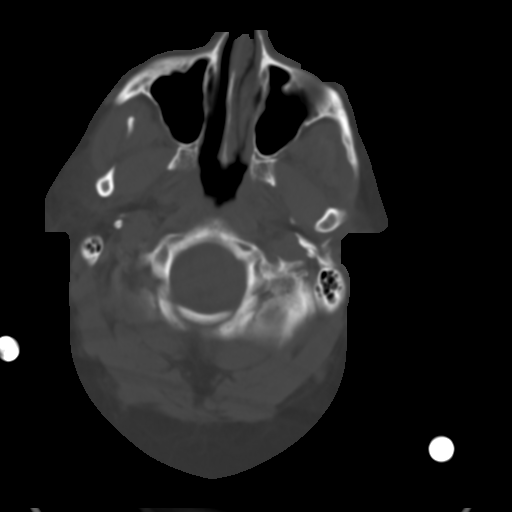
[im 10/37  brain]
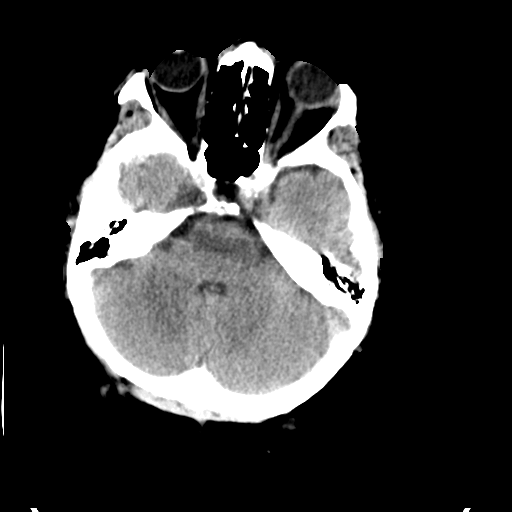
[im 14/37  brain]
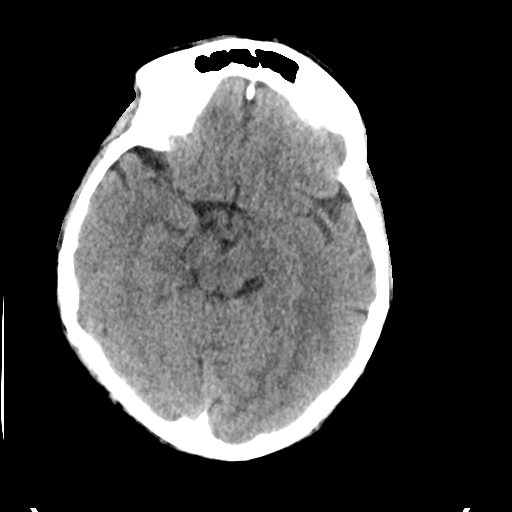
[im 19/37  brain]
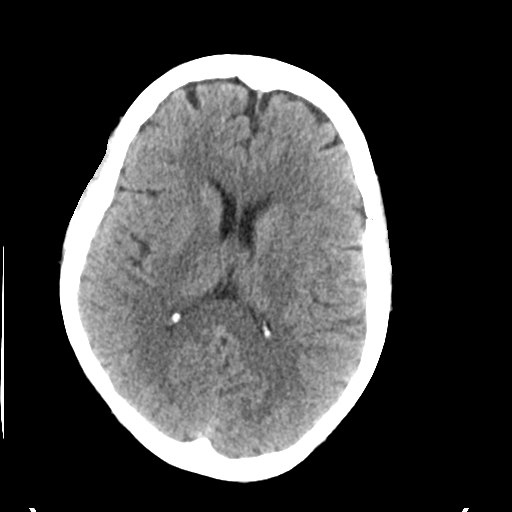
[im 23/37  brain]
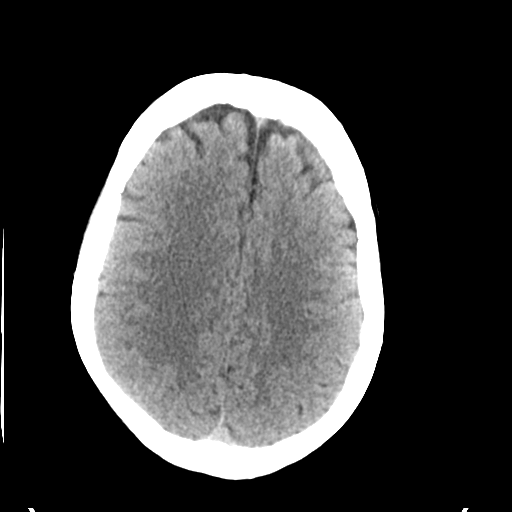
[im 23/37  bone]
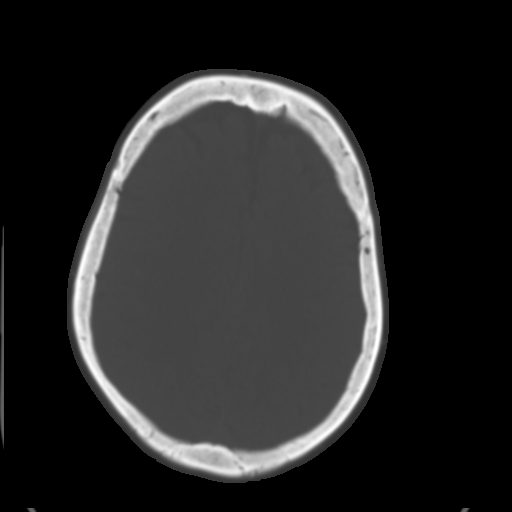
[im 28/37  brain]
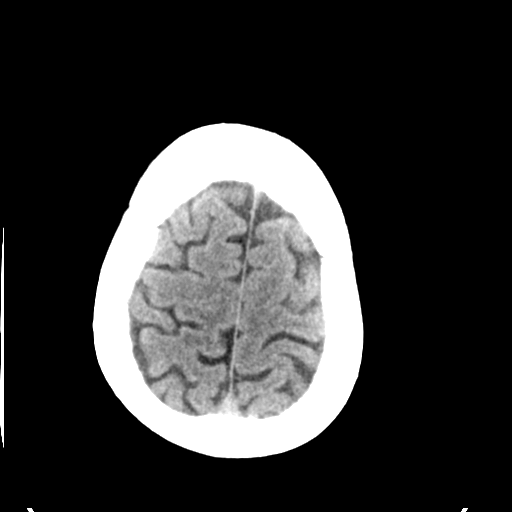
[im 32/37  brain]
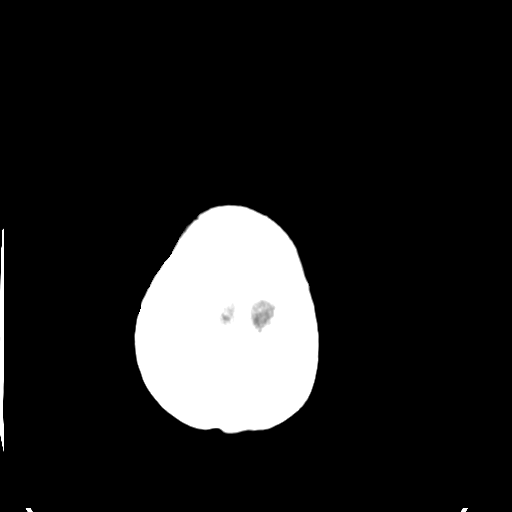

[Series 4: head bone · axial · 0.46mm/px · z∈[+1425,+1489]mm · 4 of 93 slices shown]
[im 10/93  bone]
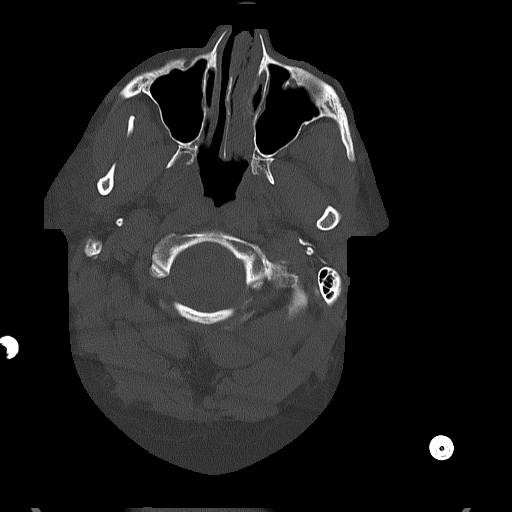
[im 19/93  bone]
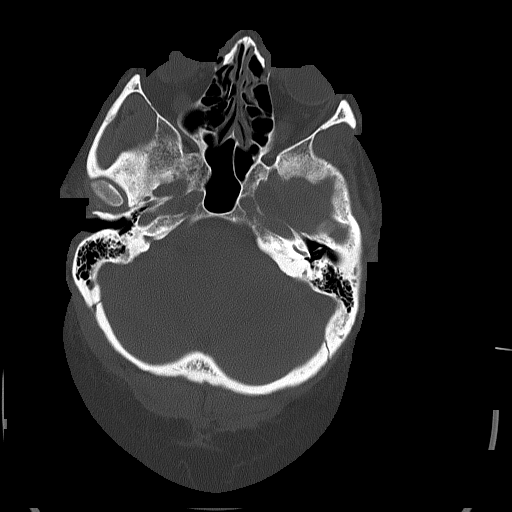
[im 28/93  bone]
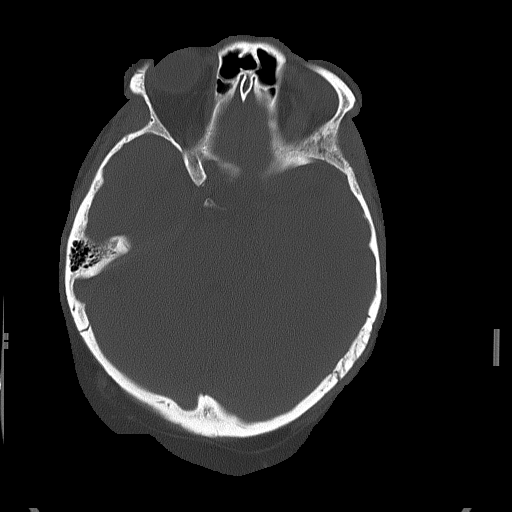
[im 42/93  bone]
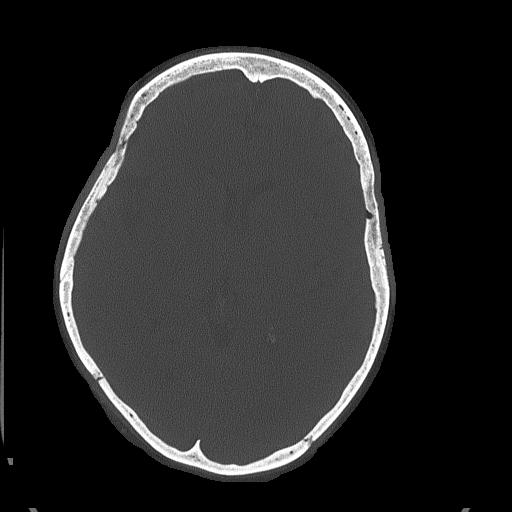

[Series 5: head without cor · coronal · non-contrast · 0.34mm/px · 3 of 75 slices shown]
[im 25/75  brain]
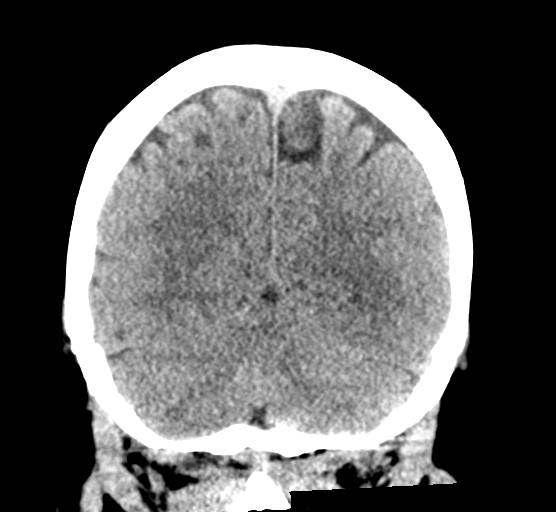
[im 33/75  brain]
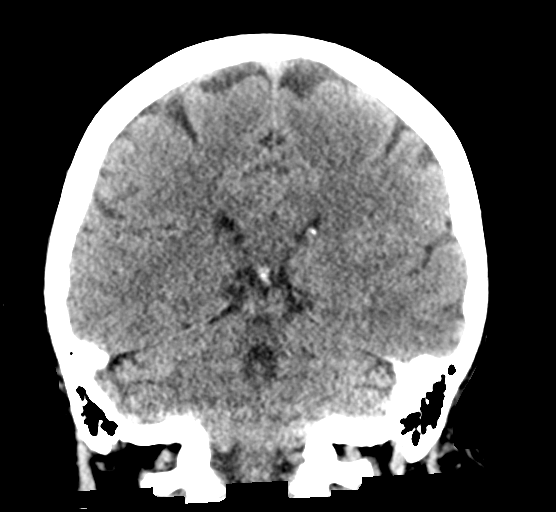
[im 42/75  brain]
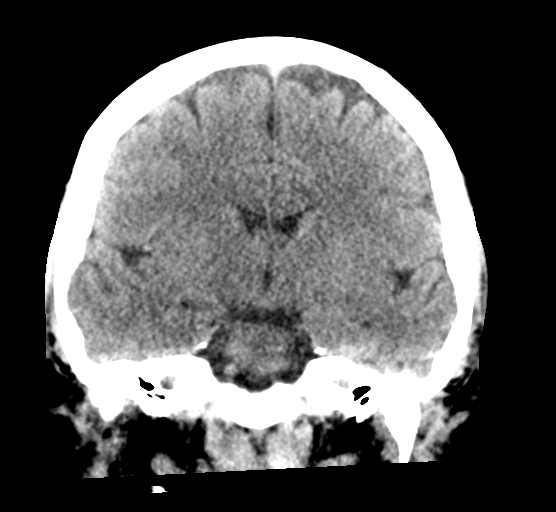

[Series 6: head without sag · sagittal · non-contrast · 0.36mm/px · 3 of 67 slices shown]
[im 23/67  brain]
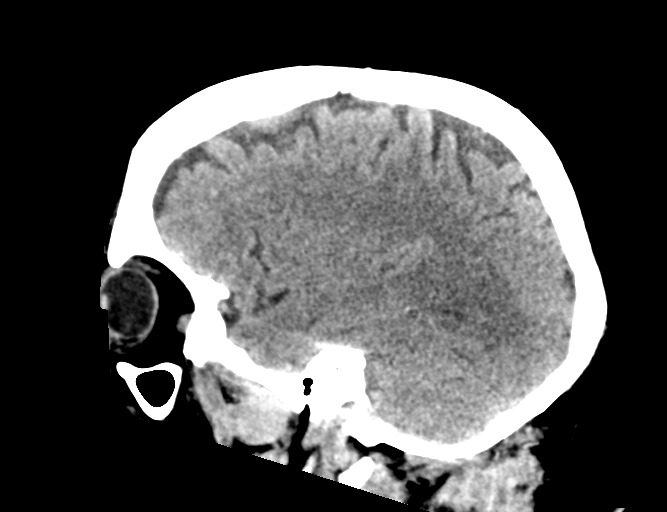
[im 34/67  brain]
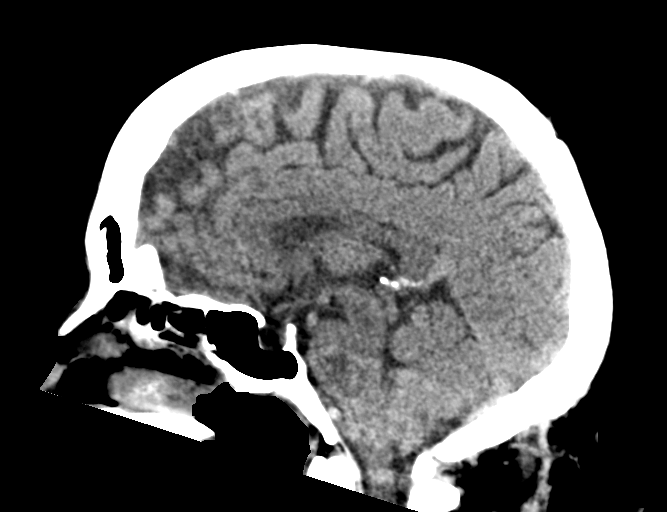
[im 45/67  brain]
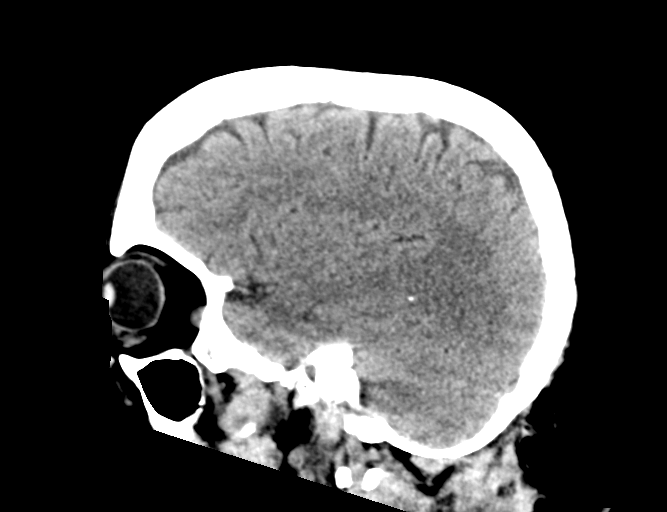

[17 of 47 positions shown; findings below may reference images not displayed]

FINDINGS: Brain: No acute intracranial abnormality. Specifically, no
hemorrhage, hydrocephalus, mass lesion, acute infarction, or
significant intracranial injury.

Vascular: No hyperdense vessel or unexpected calcification.

Skull: No acute calvarial abnormality.

Sinuses/Orbits: Visualized paranasal sinuses and mastoids clear.
Orbital soft tissues unremarkable.

Other: None
IMPRESSION: Normal study.

## 2019-09-20 MED FILL — UNITHROID 88MCG TABS: 88 ug | ORAL | 15 days supply | Qty: 15 | Fill #0 | Status: AC

## 2019-09-20 NOTE — Telephone Encounter (Signed)
[place] [employer] OPIOD Form filled out and placed on provider desk to sign ( notes attached )  This will need to be faxed once completed by Dr.

## 2019-09-24 LAB — TOXASSURE SELECT 13 (MW), URINE

## 2019-09-26 NOTE — Telephone Encounter (Signed)
Alyse Low signed the form and faxed to [facility]

## 2019-09-27 ENCOUNTER — Other Ambulatory Visit: Payer: Medicaid Other

## 2019-09-27 ENCOUNTER — Other Ambulatory Visit: Payer: Self-pay

## 2019-09-27 DIAGNOSIS — E039 Hypothyroidism, unspecified: Secondary | ICD-10-CM

## 2019-09-27 DIAGNOSIS — E1065 Type 1 diabetes mellitus with hyperglycemia: Secondary | ICD-10-CM

## 2019-09-27 DIAGNOSIS — E785 Hyperlipidemia, unspecified: Secondary | ICD-10-CM | POA: Diagnosis not present

## 2019-09-27 LAB — BAYER DCA HB A1C WAIVED: HB A1C (BAYER DCA - WAIVED): 6.6 % (ref <7.0)

## 2019-09-28 LAB — LIPID PANEL
Chol/HDL Ratio: 7.4 ratio — ABNORMAL HIGH (ref 0.0–4.4)
Cholesterol, Total: 216 mg/dL — ABNORMAL HIGH (ref 100–199)
HDL: 29 mg/dL — ABNORMAL LOW (ref >39)
LDL Chol Calc (NIH): 119 mg/dL — ABNORMAL HIGH (ref 0–99)
Triglycerides: 388 mg/dL — ABNORMAL HIGH (ref 0–149)
VLDL Cholesterol Cal: 68 mg/dL — ABNORMAL HIGH (ref 5–40)

## 2019-09-28 LAB — TSH: TSH: 2.76 u[IU]/mL (ref 0.450–4.500)

## 2019-09-28 NOTE — Telephone Encounter (Signed)
Prior Auth for Tramadol-APPROVED till 03/25/20  PA# [id number]  Pharmacy notified

## 2019-10-12 ENCOUNTER — Inpatient Hospital Stay: Admit: 2019-10-12 | Payer: PRIVATE HEALTH INSURANCE | Attending: Medical | Primary: Family Medicine

## 2019-10-12 DIAGNOSIS — R1013 Epigastric pain: Secondary | ICD-10-CM

## 2019-10-12 MED ORDER — SINCALIDE 5 MCG IJ SOLR
5 mcg | INTRAMUSCULAR | Status: AC
Start: 2019-10-12 — End: ?

## 2019-10-12 MED ORDER — SINCALIDE 5 MCG IJ SOLR
5 mcg | Freq: Once | INTRAMUSCULAR | Status: AC
Start: 2019-10-12 — End: 2019-10-12
  Administered 2019-10-12: 13:00:00 via INTRAVENOUS

## 2019-10-12 MED ORDER — TECHNETIUM TC 99M MEBROFENIN
Freq: Once | Status: AC
Start: 2019-10-12 — End: 2019-10-12
  Administered 2019-10-12: 12:00:00 via INTRAVENOUS

## 2019-10-12 MED FILL — KINEVAC 5 MCG SOLUTION FOR INJECTION: 5 mcg | INTRAMUSCULAR | Qty: 5

## 2019-10-12 NOTE — Progress Notes (Signed)
Pt had cramping and bloating during [facility] infusion.

## 2019-10-12 NOTE — Progress Notes (Signed)
Pt had cramping and bloating during [facility] infusion.

## 2019-10-18 MED FILL — VYVANSE 60MG CAPS: 60 mg | ORAL | 30 days supply | Qty: 30 | Fill #0 | Status: AC

## 2019-10-18 MED FILL — HYDROCHLOROTHIAZIDE 12.5MG TABS: 12.5 mg | ORAL | 30 days supply | Qty: 30 | Fill #0 | Status: AC

## 2019-10-18 MED FILL — UNITHROID 88MCG TABS: 88 ug | ORAL | 30 days supply | Qty: 30 | Fill #0 | Status: AC

## 2019-10-20 ENCOUNTER — Ambulatory Visit: Payer: Medicaid Other | Admitting: Family Medicine

## 2019-10-20 ENCOUNTER — Encounter: Payer: Self-pay | Admitting: Family Medicine

## 2019-10-20 ENCOUNTER — Other Ambulatory Visit: Payer: Self-pay

## 2019-10-20 VITALS — BP 127/77 | HR 102 | Temp 97.5°F | Ht 68.0 in | Wt 295.0 lb

## 2019-10-20 DIAGNOSIS — Z7689 Persons encountering health services in other specified circumstances: Secondary | ICD-10-CM | POA: Diagnosis not present

## 2019-10-20 DIAGNOSIS — G8929 Other chronic pain: Secondary | ICD-10-CM | POA: Diagnosis not present

## 2019-10-20 DIAGNOSIS — R635 Abnormal weight gain: Secondary | ICD-10-CM | POA: Diagnosis not present

## 2019-10-20 DIAGNOSIS — F39 Unspecified mood [affective] disorder: Secondary | ICD-10-CM | POA: Diagnosis not present

## 2019-10-20 DIAGNOSIS — Z794 Long term (current) use of insulin: Secondary | ICD-10-CM | POA: Diagnosis not present

## 2019-10-20 DIAGNOSIS — M25562 Pain in left knee: Secondary | ICD-10-CM

## 2019-10-20 DIAGNOSIS — F988 Other specified behavioral and emotional disorders with onset usually occurring in childhood and adolescence: Secondary | ICD-10-CM | POA: Diagnosis not present

## 2019-10-20 DIAGNOSIS — E118 Type 2 diabetes mellitus with unspecified complications: Secondary | ICD-10-CM | POA: Diagnosis not present

## 2019-10-20 DIAGNOSIS — M25561 Pain in right knee: Secondary | ICD-10-CM

## 2019-10-20 MED ORDER — HYDROXYZINE HCL 25 MG PO TABS: 25.0000 mg | ORAL_TABLET | Freq: Three times a day (TID) | ORAL | 5 refills | Status: DC

## 2019-10-20 MED ORDER — LISDEXAMFETAMINE DIMESYLATE 70 MG PO CAPS: 70.0000 mg | ORAL_CAPSULE | Freq: Every day | ORAL | 0 refills | Status: DC

## 2019-10-20 MED ORDER — DICLOFENAC SODIUM 1 % EX GEL: 4.0000 g | Freq: Four times a day (QID) | CUTANEOUS | 2 refills | Status: DC

## 2019-10-20 NOTE — Patient Instructions (Signed)
[name] will call you for appt about switching off insulin onto an oral.
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
## 2019-10-25 ENCOUNTER — Other Ambulatory Visit: Payer: Self-pay

## 2019-10-25 ENCOUNTER — Ambulatory Visit (INDEPENDENT_AMBULATORY_CARE_PROVIDER_SITE_OTHER): Payer: Medicaid Other | Admitting: Pharmacist

## 2019-10-25 DIAGNOSIS — E118 Type 2 diabetes mellitus with unspecified complications: Secondary | ICD-10-CM | POA: Diagnosis not present

## 2019-10-25 DIAGNOSIS — Z794 Long term (current) use of insulin: Secondary | ICD-10-CM

## 2019-10-25 LAB — TOXASSURE SELECT 13 (MW), URINE

## 2019-10-25 MED ORDER — TRULICITY 1.5 MG/0.5ML ~~LOC~~ SOAJ: 1.5000 mg | SUBCUTANEOUS | 4 refills | Status: DC

## 2019-10-25 NOTE — Progress Notes (Signed)
    10/25/2019 Name: Judith [name] MRN: [id number] DOB: 1970/06/21   S:  42 yoF presents for diabetes evaluation, education, and management Patient was referred and last seen by Primary Care Provider on 10/20/19  Insurance coverage/medication affordability: medicaid  Patient reports adherence with medications. . Current diabetes medications include: lantus, pioglitazone, januvia . Current hypertension medications include: lisinopril Goal 130/80 . Current hyperlipidemia medications include: atorvastatin   Patient denies hypoglycemic events.   Patient reported dietary habits: Eats 2-3 meals/day  Discussed meal planning options and Plate method for healthy eating  Avoid sugary drinks and desserts  Incorporate balanced protein, non starchy veggies, 1 serving of carbohydrate  Increase water intake  Increase physical activity as able.  Patient-reported exercise habits: N/A   O:  Lab Results  Component Value Date   HGBA1C 6.6 09/27/2019    Lipid Panel     Component Value Date/Time   CHOL 216 (H) 09/27/2019 1650   TRIG 388 (H) 09/27/2019 1650   HDL 29 (L) 09/27/2019 1650   CHOLHDL 7.4 (H) 09/27/2019 1650   LDLCALC 119 (H) 09/27/2019 1650     Home fasting blood sugars: 100-130  2 hour post-meal/random blood sugars: n/a.    A/P:  Diabetes T2DM currently CONTROLLED, but plan is to optimize regimen. Patient is able to verbalize appropriate hypoglycemia management plan. Patient is adherent with medication.    STOP Januvia    DECREASE Lantus to 30 units    START Trulicity   sample given -->LOT#[id number] E, EXP 03/15/21 #2pens   WORK TO DECREASE/DISCONTINUE PIOGLITAZONE   LIBRE 2 CGM CALLED IN FOR PATIENT, SHE IS CURRENTLY INJECTING TWICE A DAY (COUNTING HER NEW TRULICITY)  -Extensively discussed pathophysiology of diabetes, recommended lifestyle interventions, dietary effects on blood sugar control  -Counseled on s/sx of and management of hypoglycemia  -Next  A1C anticipated 01/2020      Written patient instructions provided.  Total time in face to face counseling 29 minutes.   Follow up Pharmacist TELEVISIT Visit ON  11/13/19  Regina Eck, PharmD, BCPS Clinical Pharmacist, [place]  [facility] Phone [phone]

## 2019-10-26 ENCOUNTER — Telehealth: Payer: Self-pay | Admitting: *Deleted

## 2019-10-26 NOTE — Telephone Encounter (Signed)
Trulicity 1.5mg /0.21ml pen-injectors not covered through insurance.  Patient my try and fail 2 of the 3 following medication: Bydureon, Byetta or Victoza

## 2019-10-27 ENCOUNTER — Ambulatory Visit: Payer: Medicaid Other | Admitting: Pharmacist

## 2019-10-27 MED ORDER — TRULICITY 1.5 MG/0.5ML ~~LOC~~ SOAJ: 1.5000 mg | SUBCUTANEOUS | 4 refills | Status: DC

## 2019-10-27 NOTE — Telephone Encounter (Signed)
I don't but Judith [name] might?

## 2019-10-27 NOTE — Telephone Encounter (Signed)
Do you happen to have a copy of rejection or PA?? Trulicity is now on the preferred list for Medicaid as of June 1st , 2021.  I emailed [employer] rep to determine why this is an issue.  It should be a $3 copay.

## 2019-10-27 NOTE — Telephone Encounter (Signed)
Re-faxed prescription for Trulicity to see if covered (preferred on PDL 2021)

## 2019-10-27 NOTE — Telephone Encounter (Signed)
PS anyway around this?

## 2019-10-30 ENCOUNTER — Telehealth: Payer: Self-pay | Admitting: *Deleted

## 2019-10-30 NOTE — Telephone Encounter (Signed)
PA in process for Trulicity. PA completed in [employer].  Confirmation [id number]

## 2019-10-30 NOTE — Telephone Encounter (Signed)
PA approved for 10/30/2019-10/29/2020. Pharmacy and patient aware.

## 2019-10-31 DIAGNOSIS — R1084 Generalized abdominal pain: Secondary | ICD-10-CM | POA: Diagnosis not present

## 2019-10-31 DIAGNOSIS — Z9049 Acquired absence of other specified parts of digestive tract: Secondary | ICD-10-CM | POA: Diagnosis not present

## 2019-10-31 DIAGNOSIS — I700 Atherosclerosis of aorta: Secondary | ICD-10-CM | POA: Diagnosis not present

## 2019-10-31 DIAGNOSIS — R100 Acute abdomen: Secondary | ICD-10-CM | POA: Diagnosis not present

## 2019-10-31 DIAGNOSIS — E1143 Type 2 diabetes mellitus with diabetic autonomic (poly)neuropathy: Secondary | ICD-10-CM | POA: Diagnosis not present

## 2019-11-01 DIAGNOSIS — Z23 Encounter for immunization: Secondary | ICD-10-CM | POA: Diagnosis not present

## 2019-11-01 MED FILL — EPINEPHRINE 0.3MG/0.3ML SOAJ: 0.3 mg/mL | INTRAMUSCULAR | 1 days supply | Qty: 2 | Fill #0 | Status: AC

## 2019-11-01 MED FILL — ALBUTEROL SULFATE HFA 108 AERS: 108 (90 Base) MCG/ACT | RESPIRATORY_TRACT | 16 days supply | Qty: 8.5 | Fill #0 | Status: AC

## 2019-11-01 MED FILL — BUDESONIDE-FORMOTEROL  160-4.5 AERO: 160-4.5 MCG/ACT | RESPIRATORY_TRACT | 30 days supply | Qty: 10.2 | Fill #0 | Status: AC

## 2019-11-03 ENCOUNTER — Other Ambulatory Visit: Payer: Self-pay | Admitting: Family Medicine

## 2019-11-03 ENCOUNTER — Ambulatory Visit: Payer: Medicaid Other | Admitting: Nurse Practitioner

## 2019-11-03 ENCOUNTER — Encounter: Payer: Self-pay | Admitting: Nurse Practitioner

## 2019-11-03 ENCOUNTER — Other Ambulatory Visit: Payer: Self-pay

## 2019-11-03 VITALS — BP 136/73 | HR 115 | Temp 98.6°F | Resp 20 | Ht 68.0 in | Wt 286.0 lb

## 2019-11-03 DIAGNOSIS — Z09 Encounter for follow-up examination after completed treatment for conditions other than malignant neoplasm: Secondary | ICD-10-CM

## 2019-11-03 DIAGNOSIS — K3184 Gastroparesis: Secondary | ICD-10-CM | POA: Diagnosis not present

## 2019-11-03 MED ORDER — ACCU-CHEK FASTCLIX LANCETS MISC: 3 refills | Status: DC

## 2019-11-03 MED ORDER — ACCU-CHEK GUIDE VI STRP: ORAL_STRIP | 12 refills | Status: DC

## 2019-11-03 NOTE — Patient Instructions (Addendum)
Gastroparesis Patient is a 49 year old female who presents to clinic for follow-up after ED visit for gastroparesis.  Patient went to the ED on 31 October 2019 with signs and symptoms of nausea,diarrhea right lower and right upper quadrant pain and epigastric pain.  Patient is reporting symptoms have resolved.  Only mild tenderness upper abdominal area.  Provided extensive education on gastroparesis and taking new medication Trulicity and adjusting eating habits.  Patient verbalized understanding printed handout provided to patient. Discharge instructions reviewed and completed. Patient knows to follow-up with worsening or unresolved symptoms.  Hospital discharge follow-up Hospital ED discharge follow-up discharge instructions completed.  Symptoms resolved. Patient knows to follow-up with any new symptoms, worsening or unresolved.    Gastroparesis  Gastroparesis is a condition in which food takes longer than normal to empty from the stomach. The condition is usually long-lasting (chronic). It may also be called delayed gastric emptying. There is no cure, but there are treatments and things that you can do at home to help relieve symptoms. Treating the underlying condition that causes gastroparesis can also help relieve symptoms. What are the causes? In many cases, the cause of this condition is not known. Possible causes include:  A hormone (endocrine) disorder, such as hypothyroidism or diabetes.  A nervous system disease, such as Parkinson's disease or multiple sclerosis.  Cancer, infection, or surgery that affects the stomach or vagus nerve. The vagus nerve runs from your chest, through your neck, to the lower part of your brain.  A connective tissue disorder, such as scleroderma.  Certain medicines. What increases the risk? You are more likely to develop this condition if you:  Have certain disorders or diseases, including: ? An endocrine disorder. ? An eating  disorder. ? Amyloidosis. ? Scleroderma. ? Parkinson's disease. ? Multiple sclerosis. ? Cancer or infection of the stomach or the vagus nerve.  Have had surgery on the stomach or vagus nerve.  Take certain medicines.  Are female. What are the signs or symptoms? Symptoms of this condition include:  Feeling full after eating very little.  Nausea.  Vomiting.  Heartburn.  Abdominal bloating.  Inconsistent blood sugar (glucose) levels on blood tests.  Lack of appetite.  Weight loss.  Acid from the stomach coming up into the esophagus (gastroesophageal reflux).  Sudden tightening (spasm) of the stomach, which can be painful. Symptoms may come and go. Some people may not notice any symptoms. How is this diagnosed? This condition is diagnosed with tests, such as:  Tests that check how long it takes food to move through the stomach and intestines. These tests include: ? Upper gastrointestinal (GI) series. For this test, you drink a liquid that shows up well on X-rays, and then X-rays will be taken of your intestines. ? Gastric emptying scintigraphy. For this test, you eat food that contains a small amount of radioactive material, and then scans are taken. ? Wireless capsule GI monitoring system. For this test, you swallow a pill (capsule) that records information about how foods and fluid move through your stomach.  Gastric manometry. For this test, a tube is passed down your throat and into your stomach to measure electrical and muscular activity.  Endoscopy. For this test, a long, thin tube is passed down your throat and into your stomach to check for problems in your stomach lining.  Ultrasound. This test uses sound waves to create images of inside the body. This can help rule out gallbladder disease or pancreatitis as a cause of your symptoms. How is  this treated? There is no cure for gastroparesis. Treatment may include:  Treating the underlying cause.  Managing your  symptoms by making changes to your diet and exercise habits.  Taking medicines to control nausea and vomiting and to stimulate stomach muscles.  Getting food through a feeding tube in the hospital. This may be done in severe cases.  Having surgery to insert a device into your body that helps improve stomach emptying and control nausea and vomiting (gastric neurostimulator). Follow these instructions at home:  Take over-the-counter and prescription medicines only as told by your health care provider.  Follow instructions from your health care provider about eating or drinking restrictions. Your health care provider may recommend that you: ? Eat smaller meals more often. ? Eat low-fat foods. ? Eat low-fiber forms of high-fiber foods. For example, eat cooked vegetables instead of raw vegetables. ? Have only liquid foods instead of solid foods. Liquid foods are easier to digest.  Drink enough fluid to keep your urine pale yellow.  Exercise as often as told by your health care provider.  Keep all follow-up visits as told by your health care provider. This is important. Contact a health care provider if you:  Notice that your symptoms do not improve with treatment.  Have new symptoms. Get help right away if you:  Have severe abdominal pain that does not improve with treatment.  Have nausea that is severe or does not go away.  Cannot drink fluids without vomiting. Summary  Gastroparesis is a chronic condition in which food takes longer than normal to empty from the stomach.  Symptoms include nausea, vomiting, heartburn, abdominal bloating, and loss of appetite.  Eating smaller portions, and low-fat, low-fiber foods may help you manage your symptoms.  Get help right away if you have severe abdominal pain. This information is not intended to replace advice given to you by your health care provider. Make sure you discuss any questions you have with your health care  provider. Document Revised: 08/02/2017 Document Reviewed: 03/09/2017 Elsevier Patient Education  2020 [employer].

## 2019-11-03 NOTE — Assessment & Plan Note (Signed)
Patient is a 49 year old female who presents to clinic for follow-up after ED visit for gastroparesis.  Patient went to the ED on 31 October 2019 with signs and symptoms of nausea,diarrhea right lower and right upper quadrant pain and epigastric pain.  Patient is reporting symptoms have resolved.  Only mild tenderness upper abdominal area.  Provided extensive education on gastroparesis and taking new medication Trulicity and adjusting eating habits.  Patient verbalized understanding printed handout provided to patient. Discharge instructions reviewed and completed. Patient knows to follow-up with worsening or unresolved symptoms.

## 2019-11-03 NOTE — Progress Notes (Signed)
Established Patient Office Visit  Subjective:  Patient ID: Judith [name], female    DOB: [birth year]  Age: 49 y.o. MRN: [id number]  CC:  Chief Complaint  Patient presents with  . Hospitalization Follow-up    ER - [facility] -10/31/19- gastroparesis     HPI Lan R Divelbiss presents for patient is following up today for gastroparesis after ED visit on October 31, 2019. Patient reports starting new medication Trulicity and ate a little bit more than she normally would and started having nausea and diarrhea. Right lower and right upper quadrant pain/epigastric pain. Patient is reporting symptoms have since resolved, only mild tenderness upper abdominal area.  Past Medical History:  Diagnosis Date  . Asthma   . Bipolar 1 disorder ([place])   . Depression   . GERD (gastroesophageal reflux disease)   . Hyperlipemia   . Hypothyroid   . Migraines     Past Surgical History:  Procedure Laterality Date  . ABDOMINAL SURGERY    . CARPAL TUNNEL RELEASE Bilateral 1999  . CESAREAN SECTION    . CHOLECYSTECTOMY    . THYROIDECTOMY    . TONSILLECTOMY      Family History  Problem Relation Age of Onset  . Mental illness Other   . Diabetes Other   . Healthy Mother     Social History   Socioeconomic History  . Marital status: Divorced    Spouse name: Not on file  . Number of children: Not on file  . Years of education: Not on file  . Highest education level: Not on file  Occupational History  . Not on file  Tobacco Use  . Smoking status: Former Research scientist (life sciences)  . Smokeless tobacco: Never Used  Vaping Use  . Vaping Use: Never assessed  Substance and Sexual Activity  . Alcohol use: No  . Drug use: No  . Sexual activity: Yes    Birth control/protection: I.U.D.  Other Topics Concern  . Not on file  Social History Narrative  . Not on file   Social Determinants of Health   Financial Resource Strain:   . Difficulty of Paying Living Expenses:   Food Insecurity:   . Worried About Charity fundraiser  in the Last Year:   . Arboriculturist in the Last Year:   Transportation Needs:   . Film/video editor (Medical):   Marland Kitchen Lack of Transportation (Non-Medical):   Physical Activity:   . Days of Exercise per Week:   . Minutes of Exercise per Session:   Stress:   . Feeling of Stress :   Social Connections:   . Frequency of Communication with Friends and Family:   . Frequency of Social Gatherings with Friends and Family:   . Attends Religious Services:   . Active Member of Clubs or Organizations:   . Attends Archivist Meetings:   Marland Kitchen Marital Status:   Intimate Partner Violence:   . Fear of Current or Ex-Partner:   . Emotionally Abused:   Marland Kitchen Physically Abused:   . Sexually Abused:     Outpatient Medications Prior to Visit  Medication Sig Dispense Refill  . Accu-Chek FastClix Lancets MISC Test blood sugars twice daily Dx E11.9 102 each 3  . ACCU-CHEK GUIDE test strip CHECK BLOOD SUGARS TWICE A DAY AND AS DIRECTED 100 strip 0  . albuterol (PROVENTIL) (2.5 MG/3ML) 0.083% nebulizer solution Nebulize 1 vial three times daily 150 mL 2  . albuterol (VENTOLIN HFA) 108 (90 Base) MCG/ACT inhaler  Inhale 2 puffs into the lungs every 6 (six) hours as needed for wheezing or shortness of breath. 8 g 1  . atorvastatin (LIPITOR) 10 MG tablet Take 1 tablet (10 mg total) by mouth daily. 90 tablet 3  . blood glucose meter kit and supplies Dispense based on patient and insurance preference. Check blood sugar bid and as needed. Dx: E11.9 1 each 0  . cetirizine (ZYRTEC) 10 MG tablet Take 1 tablet (10 mg total) by mouth daily. 30 tablet 11  . cyclobenzaprine (FLEXERIL) 10 MG tablet Take 1 tablet (10 mg total) by mouth 3 (three) times daily as needed for muscle spasms. 60 tablet 5  . diclofenac Sodium (VOLTAREN) 1 % GEL Apply 4 g topically 4 (four) times daily. For knee pain 400 g 2  . dicyclomine (BENTYL) 20 MG tablet Take 1 tablet (20 mg total) by mouth every 6 (six) hours. 60 tablet 5  . Dulaglutide  (TRULICITY) 1.5 TI/1.4ER SOPN Inject 0.5 mLs (1.5 mg total) into the skin once a week. 12 pen 4  . hydrOXYzine (ATARAX/VISTARIL) 25 MG tablet Take 1 tablet (25 mg total) by mouth 3 (three) times daily. 90 tablet 5  . insulin glargine (LANTUS SOLOSTAR) 100 UNIT/ML Solostar Pen Inject 20-50 Units into the skin daily. (Patient taking differently: Inject 30 Units into the skin daily. ) 5 pen PRN  . Insulin Pen Needle 31G X 5 MM MISC 1 Units by Does not apply route daily. 100 each 5  . lamoTRIgine (LAMICTAL) 150 MG tablet Take 1 tablet (150 mg total) by mouth 2 (two) times daily. 60 tablet 2  . levothyroxine (SYNTHROID) 125 MCG tablet Take 1 tablet (125 mcg total) by mouth daily. 90 tablet 1  . lisdexamfetamine (VYVANSE) 70 MG capsule Take 1 capsule (70 mg total) by mouth daily. 30 capsule 0  . lisdexamfetamine (VYVANSE) 70 MG capsule Take 1 capsule (70 mg total) by mouth daily. 30 capsule 0  . [START ON 12/19/2019] lisdexamfetamine (VYVANSE) 70 MG capsule Take 1 capsule (70 mg total) by mouth daily. 30 capsule 0  . lisinopril (ZESTRIL) 20 MG tablet Take 1 tablet (20 mg total) by mouth daily. 90 tablet 3  . meloxicam (MOBIC) 7.5 MG tablet Take 1 tablet (7.5 mg total) by mouth daily. 30 tablet 0  . montelukast (SINGULAIR) 10 MG tablet TAKE ONE TAB BY MOUTH AT BEDTIME 90 tablet 3  . pantoprazole (PROTONIX) 40 MG tablet TAKE ONE TAB BY MOUTH 2 TIMES DAILY 180 tablet 3  . pioglitazone (ACTOS) 30 MG tablet Take 1 tablet (30 mg total) by mouth daily. 90 tablet 0  . sertraline (ZOLOFT) 100 MG tablet Take 2 tablets (200 mg total) by mouth daily. 180 tablet 3  . SYMBICORT 160-4.5 MCG/ACT inhaler TAKE 2 PUFFS BY MOUTH TWICE A DAY 1 Inhaler 1  . traMADol (ULTRAM) 50 MG tablet Take 1 tablet (50 mg total) by mouth every 8 (eight) hours as needed for severe pain. 90 tablet 5   No facility-administered medications prior to visit.    Allergies  Allergen Reactions  . Diphenhydramine Other (See Comments)    Hair  feels like it is crawling  . Morphine Nausea And Vomiting    PCA PUMP N/V IV PUSH IN ER NO PROBLEM PER PT.  . Prednisone Nausea And Vomiting  . Adhesive [Tape]     Paper tape causes irritation   . Erythromycin Hives  . Metformin And Related     Diarrhea with IR and XR  .  Sulfonamide Derivatives Other (See Comments)    Does not take due to family history    ROS Review of Systems  Constitutional: Negative.   HENT: Negative.   Eyes: Negative.   Respiratory: Negative.   Cardiovascular: Negative.   Gastrointestinal: Positive for abdominal pain and constipation. Negative for diarrhea, nausea and vomiting.  Genitourinary: Negative.   Skin: Negative for color change and rash.      Objective:    Physical Exam Constitutional:      Appearance: Normal appearance.  HENT:     Head: Normocephalic.  Cardiovascular:     Rate and Rhythm: Normal rate and regular rhythm.     Pulses: Normal pulses.     Heart sounds: Normal heart sounds.  Pulmonary:     Effort: Pulmonary effort is normal.     Breath sounds: Normal breath sounds.  Abdominal:     General: Bowel sounds are normal.     Tenderness: There is abdominal tenderness.  Musculoskeletal:        General: No tenderness.     Cervical back: Neck supple.  Skin:    General: Skin is warm.     Findings: No erythema.  Neurological:     Mental Status: She is oriented to person, place, and time.  Psychiatric:        Mood and Affect: Mood normal.        Behavior: Behavior normal.     BP 136/73   Pulse (!) 115   Temp 98.6 F (37 C)   Resp 20   Ht '5\' 8"'$  (1.727 m)   Wt 286 lb (129.7 kg)   SpO2 97%   BMI 43.49 kg/m  Wt Readings from Last 3 Encounters:  11/03/19 286 lb (129.7 kg)  10/20/19 295 lb (133.8 kg)  09/19/19 292 lb (132.5 kg)     Health Maintenance Due  Topic Date Due  . Hepatitis C Screening  Never done  . OPHTHALMOLOGY EXAM  Never done  . MAMMOGRAM  05/14/2019  . COVID-19 Vaccine (2 - Moderna 2-dose series)  10/30/2019    There are no preventive care reminders to display for this patient.  Lab Results  Component Value Date   TSH 2.760 09/27/2019   Lab Results  Component Value Date   WBC 10.0 06/20/2019   HGB 14.5 06/20/2019   HCT 42.3 06/20/2019   MCV 82 06/20/2019   PLT 365 06/20/2019   Lab Results  Component Value Date   NA 138 06/20/2019   K 4.4 06/20/2019   CO2 26 06/20/2019   GLUCOSE 110 (H) 06/20/2019   BUN 23 06/20/2019   CREATININE 0.79 06/20/2019   BILITOT <0.2 06/20/2019   ALKPHOS 91 06/20/2019   AST 11 06/20/2019   ALT 11 06/20/2019   PROT 7.1 06/20/2019   ALBUMIN 4.2 06/20/2019   CALCIUM 9.2 06/20/2019   ANIONGAP 14 11/02/2018   Lab Results  Component Value Date   CHOL 216 (H) 09/27/2019   Lab Results  Component Value Date   HDL 29 (L) 09/27/2019   Lab Results  Component Value Date   LDLCALC 119 (H) 09/27/2019   Lab Results  Component Value Date   TRIG 388 (H) 09/27/2019   Lab Results  Component Value Date   CHOLHDL 7.4 (H) 09/27/2019   Lab Results  Component Value Date   HGBA1C 6.6 09/27/2019      Assessment & Plan:   Problem List Items Addressed This Visit      Digestive   Gastroparesis -  Primary    Patient is a 49 year old female who presents to clinic for follow-up after ED visit for gastroparesis.  Patient went to the ED on 31 October 2019 with signs and symptoms of nausea,diarrhea right lower and right upper quadrant pain and epigastric pain.  Patient is reporting symptoms have resolved.  Only mild tenderness upper abdominal area.  Provided extensive education on gastroparesis and taking new medication Trulicity and adjusting eating habits.  Patient verbalized understanding printed handout provided to patient. Discharge instructions reviewed and completed. Patient knows to follow-up with worsening or unresolved symptoms.        Other   Hospital discharge follow-up    Hospital ED discharge follow-up discharge instructions completed.   Symptoms resolved. Patient knows to follow-up with any new symptoms, worsening or unresolved.         No orders of the defined types were placed in this encounter.   Follow-up: Return if symptoms worsen or fail to improve.    Ivy Lynn, NP

## 2019-11-03 NOTE — Assessment & Plan Note (Addendum)
Hospital ED discharge follow-up discharge instructions completed.  Symptoms resolved. Patient knows to follow-up with any new symptoms, worsening or unresolved.

## 2019-11-04 ENCOUNTER — Inpatient Hospital Stay: Admit: 2019-11-04 | Payer: PRIVATE HEALTH INSURANCE | Primary: Family Medicine

## 2019-11-04 ENCOUNTER — Encounter

## 2019-11-04 DIAGNOSIS — Z01812 Encounter for preprocedural laboratory examination: Secondary | ICD-10-CM

## 2019-11-06 ENCOUNTER — Encounter: Payer: Self-pay | Admitting: Pharmacist

## 2019-11-06 LAB — SARS-COV-2: SARS-CoV-2: NOT DETECTED

## 2019-11-06 LAB — COVID-19: SARS-CoV-2: NOT DETECTED

## 2019-11-06 MED ORDER — FREESTYLE LIBRE 2 READER DEVI: 0 refills | Status: DC

## 2019-11-06 MED ORDER — FREESTYLE LIBRE 2 SENSOR MISC: 4 refills | Status: DC

## 2019-11-07 ENCOUNTER — Inpatient Hospital Stay: Payer: PRIVATE HEALTH INSURANCE

## 2019-11-07 ENCOUNTER — Telehealth: Payer: Self-pay | Admitting: *Deleted

## 2019-11-07 MED ORDER — GLYCOPYRROLATE 0.2 MG/ML IJ SOLN
0.2 mg/mL | INTRAMUSCULAR | Status: DC | PRN
Start: 2019-11-07 — End: 2019-11-07
  Administered 2019-11-07: 18:00:00 via INTRAVENOUS

## 2019-11-07 MED ORDER — SODIUM CHLORIDE 0.9 % IV
INTRAVENOUS | Status: DC | PRN
Start: 2019-11-07 — End: 2019-11-07
  Administered 2019-11-07: 18:00:00 via INTRAVENOUS

## 2019-11-07 MED ORDER — PROPOFOL 10 MG/ML IV EMUL
10 mg/mL | INTRAVENOUS | Status: DC | PRN
Start: 2019-11-07 — End: 2019-11-07
  Administered 2019-11-07 (×14): via INTRAVENOUS

## 2019-11-07 MED ORDER — LIDOCAINE (PF) 20 MG/ML (2 %) IJ SOLN
20 mg/mL (2 %) | INTRAMUSCULAR | Status: DC | PRN
Start: 2019-11-07 — End: 2019-11-07
  Administered 2019-11-07: 18:00:00 via INTRAVENOUS

## 2019-11-07 MED ORDER — PROPOFOL 10 MG/ML IV EMUL
10 mg/mL | INTRAVENOUS | Status: AC
Start: 2019-11-07 — End: ?

## 2019-11-07 MED FILL — DIPRIVAN 10 MG/ML INTRAVENOUS EMULSION: 10 mg/mL | INTRAVENOUS | Qty: 80

## 2019-11-07 NOTE — Anesthesia Post-Procedure Evaluation (Signed)
Procedure(s):  COLONOSCOPY AND UPPER ENDOSCOPY  ESOPHAGOGASTRODUODENOSCOPY (EGD)  ESOPHAGOGASTRODUODENAL (EGD) BIOPSY  ESOPHAGEAL DILATION.    MAC    Anesthesia Post Evaluation        Patient location during evaluation: PACU  Patient participation: complete - patient participated  Level of consciousness: awake and alert  Pain management: adequate  Airway patency: patent  Anesthetic complications: no  Cardiovascular status: acceptable  Respiratory status: acceptable  Hydration status: acceptable  Comments: Seen, no complaints   Post anesthesia nausea and vomiting:  none  Final Post Anesthesia Temperature Assessment:  Normothermia (36.0-37.5 degrees C)      INITIAL Post-op Vital signs:   Vitals Value Taken Time   BP 130/85 11/07/19 1450   Temp 36.8 ??C (98.2 ??F) 11/07/19 1430   Pulse 68 11/07/19 1450   Resp 14 11/07/19 1450   SpO2 98 % 11/07/19 1450   Vitals shown include unvalidated device data.

## 2019-11-07 NOTE — Procedures (Signed)
Procedures  by Inez Pilgrim, MD at 11/07/19 1446                Author: Inez Pilgrim, MD  Service: Gastroenterology  Author Type: Physician       Filed: 11/07/19 1447  Date of Service: 11/07/19 1446  Status: Signed          Editor: Inez Pilgrim, MD (Physician)            Pre-procedure Diagnoses        1. Colon cancer screening [Z12.11]                           Post-procedure Diagnoses        1. Diverticulosis of colon [K57.30]                           Procedures        1. [place]   [place], [place]                       Colonoscopy Procedure Note      Indications:   See Preoperative Diagnosis above   Referring Physician: Seward Meth, DO   Anesthesia/Sedation: MAC anesthesia Propofol   Endoscopist:  Dr. Inez Pilgrim   Assistant:  Endoscopy RN-1: Kelby Fam, RN   Endoscopy RN-2: Sharon Mt, RN   Preoperative diagnosis: Belching [R14.2]   Constipation, unspecified constipation type [K59.00]   Dysphagia, unspecified type [R13.10]   Epigastric pain [R10.13]   Flatulence, eructation, and gas pain [R14.3, R14.1, R14.2]   Right upper quadrant pain [R10.11]   Postoperative diagnosis: GASTRITIS, ESOPHAGITIS, DIVERTICULOSIS      Procedure in Detail:   Informed consent was obtained for the procedure, including sedation.  Risks of perforation, hemorrhage, adverse drug reaction, and aspiration were discussed. The patient was placed in the left lateral decubitus position.  Based on the pre-procedure assessment,  including review of the patient's medical history, medications, allergies, and review of systems, she had been deemed to be an appropriate candidate  for moderate sedation; she was therefore sedated with the medications listed above.   The patient was monitored continuously with ECG tracing,  pulse oximetry, blood pressure monitoring, and direct observations.        A rectal examination was  performed. The [id number] was inserted into the rectum and advanced under direct vision to the terminal ileum.  The quality of the colonic preparation was good.  A careful inspection was made as the colonoscope was withdrawn, including  a retroflexed view of the rectum; findings and interventions are described below.  Appropriate photodocumentation was obtained.      Findings:   Rectum: normal   Sigmoid: mild diverticulosis;   Descending Colon: mild diverticulosis;   Transverse Colon: mild diverticulosis;   Ascending Colon: normal   Cecum: normal   Terminal Ileum: normal      Specimens:     none      EBL: None      Complications: None; patient tolerated the procedure well.      Recommendations:      - For colon cancer screening in this average-risk patient, colonoscopy may  be repeated in 10 years.     - High fiber diet.            Signed By: Inez Pilgrim, MD                         November 07, 2019

## 2019-11-07 NOTE — Telephone Encounter (Signed)
PA form faxed to [facility] for [employer].

## 2019-11-07 NOTE — Anesthesia Pre-Procedure Evaluation (Signed)
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)    IBS Endo/Other      Hypothyroidism  Arthritis and anemia    Comments: Costochondritis (M94.0) Other Findings              Physical Exam    Airway  Mallampati: I  TM Distance: 4 - 6 cm  Neck ROM: normal range of motion   Mouth opening: Normal     Cardiovascular  Regular rate and rhythm,  S1 and S2 normal,  no murmur, click, rub, or gallop  Rhythm: regular  Rate: normal         Dental  No notable dental hx       Pulmonary  Breath sounds clear to auscultation               Abdominal  GI exam deferred       Other Findings            Anesthetic Plan    ASA: 2  Anesthesia type: MAC          Induction: Intravenous  Anesthetic plan and risks discussed with: Patient
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
## 2019-11-07 NOTE — Progress Notes (Signed)
ESOPHAGEAL DILATION 51 FR., BY [email or web address]

## 2019-11-07 NOTE — Procedures (Signed)
Procedures  by Inez Pilgrim, MD at 11/07/19 1439                Author: Inez Pilgrim, MD  Service: Gastroenterology  Author Type: Physician       Filed: 11/07/19 1445  Date of Service: 11/07/19 1439  Status: Signed          Editor: Inez Pilgrim, MD (Physician)            Pre-procedure Diagnoses        1. Epigastric pain [R10.13]        2. Gastroesophageal reflux disease without esophagitis [K21.9]        3. Esophageal dysphagia [R13.10]                           Post-procedure Diagnoses        1. Esophagitis [K20.90]        2. Gastritis without bleeding, unspecified chronicity, unspecified gastritis type [K29.70]                           Procedures        1. UPPER GI ENDOSCOPY,DILATN W GUIDE [[id number]]        2. UPPER GI ENDOSCOPY,BIOPSY [[id number]]                              [place]                       NAME:  Sara Cooke     DOB:   April 17, 1971    MRN:   [id number]       Date/Time:  11/07/2019 2:40  PM      Esophagogastroduodenoscopy (EGD) Procedure Note      Operator:  Inez Pilgrim, MD      Staff: Endoscopy RN-1: Kelby Fam, RN   Endoscopy RN-2: Sharon Mt, RN       Referring Provider:  Seward Meth, DO      Anethesia/Sedation:  MAC anesthesia Propofol      Preoperative diagnosis: Belching [R14.2]   Constipation, unspecified constipation type [K59.00]   Dysphagia, unspecified type [R13.10]   Epigastric pain [R10.13]   Flatulence, eructation, and gas pain [R14.3, R14.1, R14.2]   Right upper quadrant pain [R10.11]      Postoperative diagnosis: GASTRITIS, ESOPHAGITIS, DIVERTICULOSIS      Procedure Details       After infom consent was obtained for the procedure, with all risks and benefits of procedure explained the patient was taken to the endoscopy suite and placed in the left lateral decubitus position.  Following sequential administration of sedation as  per above, the [id number] gastroscope was inserted into the mouth and  advanced under direct vision to second portion of the duodenum.  A careful  inspection was made as the gastroscope was withdrawn, including a retroflexed view of the proximal stomach; findings and interventions are described below.        Findings:   Esophagus:Small hiatal hernia with Z line at 37 cm. Grade 2 esophagitis at GE junction, biopsies done. Random biopsies obtained throughout  esophagus. Esophagus dilated with 49 F wire guided Savary dilator.   Stomach:Patchy erythema and congestion in antrum, biopsies done   Duodenum/jejunum:Normal mucosa, biopsies done  Therapies:  As above      Specimens: GE junction, esophagus, antrum, descending duodenum bx             EBL: None      Complications:   None; patient tolerated the procedure well.             Impression:     See Postoperative diagnosis above      Recommendations:   -Acid suppression with a proton pump inhibitor., -Await pathology.      Discharge disposition:  Home in the company of driver when able to ambulate      Inez Pilgrim, MD

## 2019-11-09 MED FILL — ESOMEPRAZOLE MAGNESIUM 40MG CPDR: 40 mg | ORAL | 30 days supply | Qty: 30 | Fill #0 | Status: AC

## 2019-11-10 NOTE — Telephone Encounter (Signed)
[facility] states that they need the form re-faxed. Form Faxed.

## 2019-11-13 ENCOUNTER — Telehealth: Payer: Self-pay | Admitting: Pharmacist

## 2019-11-13 ENCOUNTER — Other Ambulatory Visit: Payer: Self-pay | Admitting: Family Medicine

## 2019-11-13 MED FILL — UNITHROID 88MCG TABS: 88 ug | ORAL | 30 days supply | Qty: 30 | Fill #0 | Status: AC

## 2019-11-13 MED FILL — VYVANSE 60MG CAPS: 60 mg | ORAL | 30 days supply | Qty: 30 | Fill #0 | Status: AC

## 2019-11-13 NOTE — Telephone Encounter (Signed)
I checked on Status today - still no approval/ denial.

## 2019-11-15 NOTE — Telephone Encounter (Signed)
PA approved 11/10/19-05/08/2020 for [facility] reader and sensor.  Sensor PA number [id number] Reader PA number [id number]   Pharmacy aware.

## 2019-11-16 MED FILL — AMPHETAMINE-DEXTROAMPHETAMIN 5 TABS: 5 mg | 30 days supply | Qty: 30 | Fill #0 | Status: AC

## 2019-11-24 ENCOUNTER — Encounter: Attending: Obstetrics & Gynecology | Primary: Family Medicine

## 2019-11-28 ENCOUNTER — Ambulatory Visit
Admit: 2019-11-28 | Discharge: 2019-11-28 | Payer: PRIVATE HEALTH INSURANCE | Attending: Obstetrics & Gynecology | Primary: Family Medicine

## 2019-11-28 ENCOUNTER — Ambulatory Visit: Attending: Obstetrics & Gynecology | Primary: Family Medicine

## 2019-11-28 DIAGNOSIS — Z01419 Encounter for gynecological examination (general) (routine) without abnormal findings: Secondary | ICD-10-CM

## 2019-11-28 MED FILL — VITAMIN D (ERGOCALCIFE 1.25 MG CAPS: 1.25 MG (50000 UT) | ORAL | 84 days supply | Qty: 12 | Fill #0 | Status: AC
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
## 2019-12-01 ENCOUNTER — Telehealth: Payer: Self-pay | Admitting: Pharmacist

## 2019-12-01 ENCOUNTER — Telehealth: Payer: Self-pay | Admitting: *Deleted

## 2019-12-01 ENCOUNTER — Other Ambulatory Visit: Payer: Self-pay

## 2019-12-01 ENCOUNTER — Encounter: Payer: Self-pay | Admitting: Physician Assistant

## 2019-12-01 ENCOUNTER — Encounter: Payer: Self-pay | Admitting: Family Medicine

## 2019-12-01 ENCOUNTER — Ambulatory Visit: Payer: Medicaid Other | Admitting: Family Medicine

## 2019-12-01 VITALS — BP 103/71 | HR 77 | Temp 97.0°F | Ht 68.0 in | Wt 287.0 lb

## 2019-12-01 DIAGNOSIS — R1013 Epigastric pain: Secondary | ICD-10-CM | POA: Diagnosis not present

## 2019-12-01 MED ORDER — TRULICITY 0.75 MG/0.5ML ~~LOC~~ SOAJ
0.7500 mg | SUBCUTANEOUS | 12 refills | Status: DC
Start: 2019-12-01 — End: 2020-01-09

## 2019-12-01 MED FILL — UNITHROID 88MCG TABS: 88 ug | ORAL | 90 days supply | Qty: 90 | Fill #0 | Status: AC

## 2019-12-01 NOTE — Telephone Encounter (Signed)
Patient with complaints of "feeling full" since starting Trulicity She is currently on the 1.5mg  weekly dosing Will allow this medication to wash out of system Start Trulicity 0.75mg  sq weekly on July 26th Continue Insulin---patient with [facility], she is educated on how to adjust her basal insulin FBGs in the 110s Patient has made great progress Will f/u by telephone in 2-3 weeks Encouraged patient to call if needs arise

## 2019-12-01 NOTE — Telephone Encounter (Addendum)
PA came in today for Trulicity Pt has MCD and per their request - pt must try and fail 2 metformin-containing products   OR Have dx of ASCVD, heart failure, or CKD  I do not see any of these in problem list/ med hx  Please advise  Will forward to [place] as well, in the case I'm missing something.    Spoke with [name] - pre- trial meds were updates in June 2021. Will proceed with PA   [facility] PA started  Response states "suspended"  conf #[id number] W

## 2019-12-01 NOTE — Addendum Note (Signed)
Addended by: Janora Norlander on: 12/01/2019 12:08 PM   Modules accepted: Orders

## 2019-12-01 NOTE — Addendum Note (Signed)
Addended by: Janora Norlander on: 12/01/2019 12:02 PM   Modules accepted: Orders, Level of Service

## 2019-12-01 NOTE — Patient Instructions (Signed)

## 2019-12-01 NOTE — Telephone Encounter (Signed)
I thought we had gotten the Trulicity already for her?

## 2019-12-01 NOTE — Progress Notes (Addendum)
Subjective: CC: Epigastric pain, nausea PCP: Janora Norlander, DO HPI:Judith [name] is a 49 y.o. female presenting to clinic today for:  1.  Epigastric pain, nausea Patient with ongoing epigastric pain, nausea.  She was actually seen in the emergency department at an outside facility on June 15 and had imaging studies and labs done there.  No evidence of pancreatitis.  Her CT abdomen was unremarkable.  She was subsequently seen on the 18th by a colleague who agreed that symptoms seem to be a medication induced gastroparesis.  She is here for follow-up due to ongoing symptoms.  She reports increased acid reflux and diarrhea.  Diarrhea is refractory to Imodium.  Reflux is refractory to her PPI.  Blood sugars however have looked fantastic.  She is not really had anything above 180 but she did have a few hypoglycemic episodes into the 60s and therefore reduced her Lantus to 25 units nightly.   ROS: Per HPI  Allergies  Allergen Reactions  . Diphenhydramine Other (See Comments)    Hair feels like it is crawling  . Morphine Nausea And Vomiting    PCA PUMP N/V IV PUSH IN ER NO PROBLEM PER PT.  . Prednisone Nausea And Vomiting  . Adhesive [Tape]     Paper tape causes irritation   . Erythromycin Hives  . Metformin And Related     Diarrhea with IR and XR  . Sulfonamide Derivatives Other (See Comments)    Does not take due to family history   Past Medical History:  Diagnosis Date  . Asthma   . Bipolar 1 disorder ([place])   . Depression   . GERD (gastroesophageal reflux disease)   . Hyperlipemia   . Hypothyroid   . Migraines     Current Outpatient Medications:  .  Accu-Chek FastClix Lancets MISC, Test blood sugars daily Dx E11.9, Disp: 102 each, Rfl: 3 .  albuterol (PROVENTIL) (2.5 MG/3ML) 0.083% nebulizer solution, Nebulize 1 vial three times daily, Disp: 150 mL, Rfl: 2 .  albuterol (VENTOLIN HFA) 108 (90 Base) MCG/ACT inhaler, Inhale 2 puffs into the lungs every 6 (six) hours as  needed for wheezing or shortness of breath., Disp: 8 g, Rfl: 1 .  atorvastatin (LIPITOR) 10 MG tablet, Take 1 tablet (10 mg total) by mouth daily., Disp: 90 tablet, Rfl: 3 .  blood glucose meter kit and supplies, Dispense based on patient and insurance preference. Check blood sugar bid and as needed. Dx: E11.9, Disp: 1 each, Rfl: 0 .  Continuous Blood Gluc Receiver (FREESTYLE LIBRE 2 READER) DEVI, Use to scan blood sugar as directed up to 6 times daily. DX: E11.9, Disp: 1 each, Rfl: 0 .  Continuous Blood Gluc Sensor (FREESTYLE LIBRE 2 SENSOR) MISC, Use to check blood sugar as directed up to 6 times daily. DX: E11.9. 2 sensors=30 day supply, 6 sensors=90 day supply, Disp: 6 each, Rfl: 4 .  cyclobenzaprine (FLEXERIL) 10 MG tablet, Take 1 tablet (10 mg total) by mouth 3 (three) times daily as needed for muscle spasms., Disp: 60 tablet, Rfl: 5 .  diclofenac Sodium (VOLTAREN) 1 % GEL, Apply 4 g topically 4 (four) times daily. For knee pain, Disp: 400 g, Rfl: 2 .  dicyclomine (BENTYL) 20 MG tablet, Take 1 tablet (20 mg total) by mouth every 6 (six) hours., Disp: 60 tablet, Rfl: 5 .  Dulaglutide (TRULICITY) 1.5 OD/0.5YR SOPN, Inject 0.5 mLs (1.5 mg total) into the skin once a week., Disp: 12 pen, Rfl: 4 .  glucose blood (ACCU-CHEK GUIDE) test strip, Use as instructed E11.9, Disp: 100 strip, Rfl: 12 .  hydrOXYzine (ATARAX/VISTARIL) 25 MG tablet, Take 1 tablet (25 mg total) by mouth 3 (three) times daily., Disp: 90 tablet, Rfl: 5 .  insulin glargine (LANTUS SOLOSTAR) 100 UNIT/ML Solostar Pen, Inject 20-50 Units into the skin daily. (Patient taking differently: Inject 30 Units into the skin daily. ), Disp: 5 pen, Rfl: PRN .  Insulin Pen Needle 31G X 5 MM MISC, 1 Units by Does not apply route daily., Disp: 100 each, Rfl: 5 .  lamoTRIgine (LAMICTAL) 150 MG tablet, Take 1 tablet (150 mg total) by mouth 2 (two) times daily., Disp: 60 tablet, Rfl: 2 .  levothyroxine (SYNTHROID) 125 MCG tablet, Take 1 tablet (125 mcg  total) by mouth daily., Disp: 90 tablet, Rfl: 1 .  lisdexamfetamine (VYVANSE) 70 MG capsule, Take 1 capsule (70 mg total) by mouth daily., Disp: 30 capsule, Rfl: 0 .  lisinopril (ZESTRIL) 20 MG tablet, Take 1 tablet (20 mg total) by mouth daily., Disp: 90 tablet, Rfl: 3 .  meloxicam (MOBIC) 7.5 MG tablet, Take 1 tablet (7.5 mg total) by mouth daily., Disp: 30 tablet, Rfl: 0 .  montelukast (SINGULAIR) 10 MG tablet, TAKE ONE TAB BY MOUTH AT BEDTIME, Disp: 90 tablet, Rfl: 3 .  pantoprazole (PROTONIX) 40 MG tablet, TAKE ONE TAB BY MOUTH 2 TIMES DAILY, Disp: 180 tablet, Rfl: 3 .  pioglitazone (ACTOS) 30 MG tablet, Take 1 tablet (30 mg total) by mouth daily., Disp: 90 tablet, Rfl: 0 .  sertraline (ZOLOFT) 100 MG tablet, Take 2 tablets (200 mg total) by mouth daily., Disp: 180 tablet, Rfl: 3 .  traMADol (ULTRAM) 50 MG tablet, Take 1 tablet (50 mg total) by mouth every 8 (eight) hours as needed for severe pain., Disp: 90 tablet, Rfl: 5 .  lisdexamfetamine (VYVANSE) 70 MG capsule, Take 1 capsule (70 mg total) by mouth daily., Disp: 30 capsule, Rfl: 0 .  [START ON 12/19/2019] lisdexamfetamine (VYVANSE) 70 MG capsule, Take 1 capsule (70 mg total) by mouth daily., Disp: 30 capsule, Rfl: 0 .  SYMBICORT 160-4.5 MCG/ACT inhaler, TAKE 2 PUFFS BY MOUTH TWICE A DAY (Patient not taking: Reported on 12/01/2019), Disp: 1 Inhaler, Rfl: 1 Social History   Socioeconomic History  . Marital status: Divorced    Spouse name: Not on file  . Number of children: Not on file  . Years of education: Not on file  . Highest education level: Not on file  Occupational History  . Not on file  Tobacco Use  . Smoking status: Former Research scientist (life sciences)  . Smokeless tobacco: Never Used  Vaping Use  . Vaping Use: Never assessed  Substance and Sexual Activity  . Alcohol use: No  . Drug use: No  . Sexual activity: Yes    Birth control/protection: I.U.D.  Other Topics Concern  . Not on file  Social History Narrative  . Not on file   Social  Determinants of Health   Financial Resource Strain:   . Difficulty of Paying Living Expenses:   Food Insecurity:   . Worried About Charity fundraiser in the Last Year:   . Arboriculturist in the Last Year:   Transportation Needs:   . Film/video editor (Medical):   Marland Kitchen Lack of Transportation (Non-Medical):   Physical Activity:   . Days of Exercise per Week:   . Minutes of Exercise per Session:   Stress:   . Feeling of Stress :  Social Connections:   . Frequency of Communication with Friends and Family:   . Frequency of Social Gatherings with Friends and Family:   . Attends Religious Services:   . Active Member of Clubs or Organizations:   . Attends Archivist Meetings:   Marland Kitchen Marital Status:   Intimate Partner Violence:   . Fear of Current or Ex-Partner:   . Emotionally Abused:   Marland Kitchen Physically Abused:   . Sexually Abused:    Family History  Problem Relation Age of Onset  . Mental illness Other   . Diabetes Other   . Healthy Mother     Objective: Office vital signs reviewed. BP 103/71   Pulse 77   Temp (!) 97 F (36.1 C) (Temporal)   Ht 5' 8" (1.727 m)   Wt 287 lb (130.2 kg)   SpO2 97%   BMI 43.64 kg/m   Physical Examination:  General: Awake, alert, morbidly obese, No acute distress HEENT: Normal, sclera white. No jaundice GI: soft, obese. +epigastric TTP. No rebound or guarding  Assessment/ Plan: 49 y.o. female   1. Epigastric pain I agree that many of her symptoms are likely secondary to medication induced gastroparesis.  We are going to back down on the Trulicity to 1.18 but we will perform a 1 week washout of medication with subsequent insulin adjustment during that week.  Regarding plan for SGLT2 if unable to tolerate reduced dose.  Push oral fluids.  Recheck labs given area of pain.  Follow-up as directed or in September for 67-monthdiabetic follow-up.  JAlmyra Freemet with patient as well today.  Will place referral to [facility] for evaluation.  May  benefit from gastric emptying study - CMP14+EGFR - Lipase - CBC with Differential   Orders Placed This Encounter  Procedures  . CMP14+EGFR  . Lipase  . CBC with Differential  . Ambulatory referral to Gastroenterology    Referral Priority:   Routine    Referral Type:   Consultation    Referral Reason:   Specialty Services Required    Number of Visits Requested:   1   No orders of the defined types were placed in this encounter.    AJanora Norlander DO [place]([phone]

## 2019-12-02 LAB — CBC WITH DIFFERENTIAL/PLATELET
Basophils Absolute: 0.1 10*3/uL (ref 0.0–0.2)
Basos: 1 %
EOS (ABSOLUTE): 0.2 10*3/uL (ref 0.0–0.4)
Eos: 2 %
Hematocrit: 45.6 % (ref 34.0–46.6)
Hemoglobin: 14.4 g/dL (ref 11.1–15.9)
Immature Grans (Abs): 0 10*3/uL (ref 0.0–0.1)
Immature Granulocytes: 0 %
Lymphocytes Absolute: 2.8 10*3/uL (ref 0.7–3.1)
Lymphs: 30 %
MCH: 26.2 pg — ABNORMAL LOW (ref 26.6–33.0)
MCHC: 31.6 g/dL (ref 31.5–35.7)
MCV: 83 fL (ref 79–97)
Monocytes Absolute: 0.7 10*3/uL (ref 0.1–0.9)
Monocytes: 8 %
Neutrophils Absolute: 5.6 10*3/uL (ref 1.4–7.0)
Neutrophils: 59 %
Platelets: 372 10*3/uL (ref 150–450)
RBC: 5.49 x10E6/uL — ABNORMAL HIGH (ref 3.77–5.28)
RDW: 13.5 % (ref 11.7–15.4)
WBC: 9.3 10*3/uL (ref 3.4–10.8)

## 2019-12-02 LAB — CMP14+EGFR
ALT: 9 IU/L (ref 0–32)
AST: 14 IU/L (ref 0–40)
Albumin/Globulin Ratio: 1.8 (ref 1.2–2.2)
Albumin: 4.5 g/dL (ref 3.8–4.8)
Alkaline Phosphatase: 96 IU/L (ref 48–121)
BUN/Creatinine Ratio: 17 (ref 9–23)
BUN: 15 mg/dL (ref 6–24)
Bilirubin Total: 0.2 mg/dL (ref 0.0–1.2)
CO2: 28 mmol/L (ref 20–29)
Calcium: 9.5 mg/dL (ref 8.7–10.2)
Chloride: 101 mmol/L (ref 96–106)
Creatinine, Ser: 0.86 mg/dL (ref 0.57–1.00)
GFR calc Af Amer: 92 mL/min/{1.73_m2} (ref >59)
GFR calc non Af Amer: 80 mL/min/{1.73_m2} (ref >59)
Globulin, Total: 2.5 g/dL (ref 1.5–4.5)
Glucose: 104 mg/dL — ABNORMAL HIGH (ref 65–99)
Potassium: 4.5 mmol/L (ref 3.5–5.2)
Sodium: 142 mmol/L (ref 134–144)
Total Protein: 7 g/dL (ref 6.0–8.5)

## 2019-12-02 LAB — LIPASE: Lipase: 66 U/L (ref 14–72)

## 2019-12-04 ENCOUNTER — Telehealth

## 2019-12-04 ENCOUNTER — Telehealth: Payer: Self-pay | Admitting: Family Medicine

## 2019-12-04 MED ORDER — METOCLOPRAMIDE HCL 5 MG PO TABS
5.0000 mg | ORAL_TABLET | Freq: Three times a day (TID) | ORAL | 0 refills | Status: DC | PRN
Start: 2019-12-04 — End: 2020-02-22

## 2019-12-04 NOTE — Telephone Encounter (Signed)
Pt called stating that she saw Dr Lajuana Ripple on Friday for stomach issues and is still having stomach issues. Says stomach has not felt any better. Pt says she was prescribed medicine for nausea but says that has not helped either. Wants to know if someone can send her something else in since Dr Lajuana Ripple is not in office today or advise her on what she can do to help.

## 2019-12-04 NOTE — Telephone Encounter (Signed)
Call placed to [place] [employer] to seek approval for Trulicity 0.75mg  sq weekly 84mL/per 30 days  Patient was unable to tolerate 1.5mg  sq weekly dose (cancelled 1.5mg  RX at pharmacy)  She has tried and failed metformin containing product per patient and chart review  SI#[id number] PA EFFECTIVE UNTIL 11/28/2020  PA approved and patient/PHARMACY notified

## 2019-12-04 NOTE — Telephone Encounter (Signed)
Left axillary [place] ordered

## 2019-12-04 NOTE — Telephone Encounter (Signed)
Patient states that she has not had BM since 11/28/19.  Complains of nausea, reflux and abdominal pain.  States that she has used an enema, taken a stool softner and miralax with no relief.

## 2019-12-04 NOTE — Telephone Encounter (Signed)
Since it was a dose change---medicaid requires a new one Should be approved, we just submitted :)

## 2019-12-04 NOTE — Telephone Encounter (Signed)
Reglan Prescription sent to pharmacy. Continue to use Miralax and keep GI appt.

## 2019-12-05 ENCOUNTER — Telehealth: Payer: Self-pay | Admitting: Family Medicine

## 2019-12-05 NOTE — Telephone Encounter (Signed)
Patient has not been able to go to work since last Friday needs for 07/17, 07/19, 7/20. Patient saw you last week for it. Is note okay ? Wants sent to mychart.

## 2019-12-05 NOTE — Telephone Encounter (Signed)
Yes, this is fine to give.

## 2019-12-05 NOTE — Telephone Encounter (Signed)
Just FYI    Patient is finally going to the bathroom (after enema, miralax and suppositories). 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  She has now had to file for short term disability (per work to be sent to PCP office  HOLD trulicity until symptoms resolve, may not restart pending patient progress.  Instructed patient to increase insulin to 25 units nightly. She states her BGs are still within goal range.  Will f/u w/ patient on 12/18/19 by telephone

## 2019-12-05 NOTE — Telephone Encounter (Signed)
Letter sent to my chart per patient request

## 2019-12-06 NOTE — Telephone Encounter (Signed)
Where pt works, she has to wait on someone to relieve her so that she can go to the restroom. She needs to go out on STD to keep her job. She is starting on a lower dose of the medication next Monday and has appt with GI next Monday Also needs her accommodations to remain as not more than 5 days in a week with breaks every 2 hours

## 2019-12-06 NOTE — Progress Notes (Signed)
The patient called stating she wants BILATERAL ultrasounds ( axilla). Dr. Andrey Campanile was aware of only the LEFT axilla lump. She was not aware of anything going on with the RIGHT axilla. The patient was asked to call back with further explanation per Dr. Andrey Campanile.

## 2019-12-07 MED FILL — HYDROCHLOROTHIAZIDE 12.5MG TABS: 12.5 mg | ORAL | 90 days supply | Qty: 90 | Fill #0 | Status: AC

## 2019-12-07 MED FILL — VYVANSE 60MG CAPS: 60 mg | 30 days supply | Qty: 30 | Fill #0 | Status: AC

## 2019-12-07 NOTE — Telephone Encounter (Signed)
Completed.  Further dates pending specialist evaluation.

## 2019-12-11 ENCOUNTER — Ambulatory Visit
Admit: 2019-12-11 | Discharge: 2019-12-11 | Payer: PRIVATE HEALTH INSURANCE | Attending: Family | Primary: Family Medicine

## 2019-12-11 ENCOUNTER — Ambulatory Visit: Attending: Family | Primary: Family Medicine

## 2019-12-11 DIAGNOSIS — N6011 Diffuse cystic mastopathy of right breast: Secondary | ICD-10-CM

## 2019-12-11 NOTE — Progress Notes (Signed)
HISTORY OF PRESENT ILLNESS  Sara Cooke is a 49 y.o. female.  HPI NEW patient presents for evaluation of a RIGHT axillary mass.  Reports feeling RIGHT mass and fullness for a few weeks.  Was previously documents as a LEFT axillary mass, but she does not have LEFT axillary concerns at this time.  Denies breast changes and other worrisome changes to axilla.      Breast history -   Referring - self  No history of breast biopsies    Family history -   No family history of breast or ovarian cancer      OB History     Gravida   0    Para   0    Term   0    Preterm   0    AB   0    Living   0       SAB   0    TAB   0    Ectopic   0    Molar   0    Multiple   0    Live Births   0          Obstetric Comments   Menarche 15, LMP 2019, # of children 0, age of 1st delivery -, Hysterectomy/oophorectomy NO/NO, Breast bx NO, history of breast feeding NO, BCP YES, Hormone therapy NO             Past Surgical History:   Procedure Laterality Date   ??? COLONOSCOPY N/A 11/07/2019    COLONOSCOPY AND UPPER ENDOSCOPY performed by Alma Downs, MD at [facility] ENDOSCOPY   ??? HX GYN     ??? HX OTHER SURGICAL      wisdom teeth,gums   ??? PR CARDIAC SURG PROCEDURE UNLIST     ??? PR COLONOSCOPY W/BIOPSY SINGLE/MULTIPLE  01/07/2009        ??? PR EGD TRANSORAL BIOPSY SINGLE/MULTIPLE  01/07/2009              ROS    Physical Exam  Constitutional:       Appearance: Normal appearance.   Chest:      Breasts:         Right: No mass, nipple discharge, skin change or tenderness.         Left: No mass, nipple discharge, skin change or tenderness.      Comments: RIGHT axillary swelling as compared to the LEFT, but no discrete mass palpated  Musculoskeletal:      Comments: FROM - UE x 2   Lymphadenopathy:      Upper Body:      Right upper body: Axillary adenopathy present. No supraclavicular adenopathy.      Left upper body: No supraclavicular or axillary adenopathy.   Neurological:      Mental Status: She is alert.   Psychiatric:         Attention and Perception: Attention  normal.         Mood and Affect: Mood normal.         Speech: Speech normal.         Behavior: Behavior normal.         Visit Vitals  BP 130/82 (BP 1 Location: Right arm, BP Patient Position: Sitting, BP Cuff Size: Adult)   Pulse 76   Ht 5' 5.5" (1.664 m)   Wt 215 lb (97.5 kg)   BMI 35.23 kg/m??         ASSESSMENT and PLAN  Encounter Diagnoses   Name Primary?   ??? Fibrocystic breast changes of both breasts Yes   ??? Right axillary swelling        Normal exam of the breasts, but RIGHT axillary swelling - RIGHT axilla feels fuller than LEFT although no discrete mass is felt.    BDmammogram 3D and RIGHT axillary [place].  If imaging is normal, than no clinical follow-up is needed.    RTC PRN. She is comfortable with this plan.  All questions answered and she stated understanding.        Total time spent for this patient - 30 minutes.

## 2019-12-14 ENCOUNTER — Encounter

## 2019-12-14 ENCOUNTER — Inpatient Hospital Stay: Admit: 2019-12-14 | Payer: PRIVATE HEALTH INSURANCE | Attending: Family | Primary: Family Medicine

## 2019-12-14 DIAGNOSIS — M7989 Other specified soft tissue disorders: Secondary | ICD-10-CM

## 2019-12-14 DIAGNOSIS — N6011 Diffuse cystic mastopathy of right breast: Secondary | ICD-10-CM

## 2019-12-14 MED FILL — ESOMEPRAZOLE MAGNESIUM 40MG CPDR: 40 mg | ORAL | 60 days supply | Qty: 60 | Fill #1 | Status: AC

## 2019-12-18 ENCOUNTER — Encounter: Payer: PRIVATE HEALTH INSURANCE | Attending: Family | Primary: Family Medicine

## 2019-12-18 ENCOUNTER — Encounter: Attending: Surgery | Primary: Family Medicine

## 2019-12-18 ENCOUNTER — Telehealth: Payer: Medicaid Other | Admitting: Pharmacist

## 2019-12-19 ENCOUNTER — Telehealth: Payer: Self-pay | Admitting: Family Medicine

## 2019-12-19 NOTE — Telephone Encounter (Signed)
Would stop trulicity to see if symptoms resolve.  Referral is already in place for GI.  If not moving bowels at all, having severe pain, nausea, needs to go to ED to rule out obstruction

## 2019-12-19 NOTE — Telephone Encounter (Signed)
·   Patient still having GI issues--says her bowels aren't moving, when it does it is like mush  Nausea has subsided, but more constipated now, taking Miralax DAILY--feels like she "can't get it out"  Taking reglan only as needed  Re-Started Trulicity on Tuesday (lower dose)--she does not feel that it is the medicine making her feel this way  Recommended patient increase in fiber in her diet  More of a gastroparesis issue vs fiber vs ?   Does patient need to seen? GI appt is on 01/09/20

## 2019-12-19 NOTE — Telephone Encounter (Signed)
Pt's appt with specialist is not till 01/09/20, FMLA ppw pulled and put on providers desk Pt still having issues, not able to work with this, nausea has subsided, but still issues with stomach

## 2019-12-20 NOTE — Telephone Encounter (Signed)
Did I already give this back?  I don't have this on my desk

## 2019-12-20 NOTE — Telephone Encounter (Signed)
Patient made aware of PCP recommendations  Patient verbalizes understanding

## 2019-12-22 NOTE — Telephone Encounter (Signed)
Updated FMLA ppw faxed

## 2019-12-26 ENCOUNTER — Telehealth: Payer: Self-pay | Admitting: Family Medicine

## 2019-12-26 NOTE — Telephone Encounter (Signed)
I believe we indicated those restrictions previously.  Tye [place] can you check on this?

## 2019-12-26 NOTE — Telephone Encounter (Signed)
Pt came into office, copy of updated FMLA ppw was given to patient. She was also wanting to know about RTW date is the same as date of appt with specialist. We will keep it as this until her appt

## 2019-12-26 NOTE — Telephone Encounter (Signed)
Pt called stating that she knows her short term disability forms have been completed and Dr Lajuana Ripple has her returning to work on the 14th but says she needs to have restrictions listed for her when she goes back that include: not working more than 3 days in a row for a month and getting a 15 min break every 2-3 hrs to eat.

## 2020-01-09 ENCOUNTER — Ambulatory Visit: Payer: Medicaid Other | Admitting: Physician Assistant

## 2020-01-09 ENCOUNTER — Encounter: Payer: Self-pay | Admitting: Physician Assistant

## 2020-01-09 VITALS — BP 102/74 | HR 78 | Ht 67.5 in | Wt 292.5 lb

## 2020-01-09 DIAGNOSIS — R1314 Dysphagia, pharyngoesophageal phase: Secondary | ICD-10-CM

## 2020-01-09 DIAGNOSIS — R1013 Epigastric pain: Secondary | ICD-10-CM | POA: Diagnosis not present

## 2020-01-09 DIAGNOSIS — K219 Gastro-esophageal reflux disease without esophagitis: Secondary | ICD-10-CM | POA: Diagnosis not present

## 2020-01-09 DIAGNOSIS — R194 Change in bowel habit: Secondary | ICD-10-CM | POA: Diagnosis not present

## 2020-01-09 MED ORDER — SUTAB 1479-225-188 MG PO TABS: 1.0000 | ORAL_TABLET | Freq: Once | ORAL | 0 refills | Status: AC

## 2020-01-09 NOTE — Progress Notes (Signed)
Agree with assessment and plan as outlined. Will need to confirm, has she had a manometry to confirm she has had achalasia? Will proceed with EGD in light of her symptoms and consider empiric dilation but no plans for Botox, if she has achalasia will need more definitive therapy for that.

## 2020-01-09 NOTE — Progress Notes (Signed)
Chief Complaint: Epigastric pain, GERD  HPI:    Judith [name] is a 49 year old female with a past medical history as listed below including GERD, previously known to Dr. Deatra Ina, who was referred to me by Janora Norlander, DO for a complaint of epigastric pain and GERD.      11/29/2006 EGD with Botox injection.  This was given for achalasia.    12/01/2019 office visit with PCP.  At that time discussed ongoing epigastric pain and nausea.  Apparently had been to the ER on June 15 and had imaging studies and labs done.  No evidence of pancreatitis.  CT was unremarkable.  Was thought her symptoms were medication induced gastroparesis.  At time of follow-up she had increased reflux and diarrhea.  At that visit as discussed they are going to back down on Trulicity.    12/01/2019 CBC, lipase and CMP normal.    Today, the patient presents to clinic and describes a lot of GI issues.  She tells me that she has diabetes and was started on Trulicity a few months ago which she took for a week but then started with diarrhea which was endless until she took 3 Imodium 3 days later.  She finally went to the ER and they did labs and a CT and told her everything was okay but that she might have gastroparesis.  She then went to see her PCP who took her off of Trulicity, she has not been taking this for the past 3 weeks but continues with alternating between diarrhea and constipation as well as bloating and hardness in her abdomen accompanied by epigastric pain which is sharp and feels like she has been "punched", constantly.  It is worse after eating.  Also discusses some issue with feeling like food is sitting at the top of her chest.  Also has pain in her low back and rectum which she typically associates with constipation.  She has been using generic MiraLAX but this does not seem to really help.  Apparently has been out of work since the middle of July due to the symptoms.  Tells me she can have unexpected urgent diarrhea.   Continues her Dicyclomine twice a day and Pantoprazole 40 mg, she typically takes this 2 tabs in the morning.    Describes distant diagnosis of IBS, but tells me there was never any testing.    Denies fever, chills or blood in her stool.  Past Medical History:  Diagnosis Date   Asthma    Bipolar 1 disorder ([place])    Depression    GERD (gastroesophageal reflux disease)    Hyperlipemia    Hypothyroid    Migraines     Past Surgical History:  Procedure Laterality Date   ABDOMINAL SURGERY     CARPAL TUNNEL RELEASE Bilateral 1999   CESAREAN SECTION     CHOLECYSTECTOMY     THYROIDECTOMY     TONSILLECTOMY      Current Outpatient Medications  Medication Sig Dispense Refill   Accu-Chek FastClix Lancets MISC Test blood sugars daily Dx E11.9 102 each 3   albuterol (PROVENTIL) (2.5 MG/3ML) 0.083% nebulizer solution Nebulize 1 vial three times daily 150 mL 2   albuterol (VENTOLIN HFA) 108 (90 Base) MCG/ACT inhaler Inhale 2 puffs into the lungs every 6 (six) hours as needed for wheezing or shortness of breath. 8 g 1   atorvastatin (LIPITOR) 10 MG tablet Take 1 tablet (10 mg total) by mouth daily. 90 tablet 3   blood  glucose meter kit and supplies Dispense based on patient and insurance preference. Check blood sugar bid and as needed. Dx: E11.9 1 each 0   Continuous Blood Gluc Receiver (FREESTYLE LIBRE 2 READER) DEVI Use to scan blood sugar as directed up to 6 times daily. DX: E11.9 1 each 0   Continuous Blood Gluc Sensor (FREESTYLE LIBRE 2 SENSOR) MISC Use to check blood sugar as directed up to 6 times daily. DX: E11.9. 2 sensors=30 day supply, 6 sensors=90 day supply 6 each 4   cyclobenzaprine (FLEXERIL) 10 MG tablet Take 1 tablet (10 mg total) by mouth 3 (three) times daily as needed for muscle spasms. 60 tablet 5   diclofenac Sodium (VOLTAREN) 1 % GEL Apply 4 g topically 4 (four) times daily. For knee pain 400 g 2   dicyclomine (BENTYL) 20 MG tablet Take 1 tablet (20 mg  total) by mouth every 6 (six) hours. 60 tablet 5   glucose blood (ACCU-CHEK GUIDE) test strip Use as instructed E11.9 100 strip 12   hydrOXYzine (ATARAX/VISTARIL) 25 MG tablet Take 1 tablet (25 mg total) by mouth 3 (three) times daily. 90 tablet 5   insulin glargine (LANTUS SOLOSTAR) 100 UNIT/ML Solostar Pen Inject 20-50 Units into the skin daily. (Patient taking differently: Inject 30 Units into the skin daily. ) 5 pen PRN   Insulin Pen Needle 31G X 5 MM MISC 1 Units by Does not apply route daily. 100 each 5   lamoTRIgine (LAMICTAL) 150 MG tablet Take 1 tablet (150 mg total) by mouth 2 (two) times daily. 60 tablet 2   levothyroxine (SYNTHROID) 125 MCG tablet Take 1 tablet (125 mcg total) by mouth daily. 90 tablet 1   lisdexamfetamine (VYVANSE) 70 MG capsule Take 1 capsule (70 mg total) by mouth daily. 30 capsule 0   lisinopril (ZESTRIL) 20 MG tablet Take 1 tablet (20 mg total) by mouth daily. 90 tablet 3   meloxicam (MOBIC) 7.5 MG tablet Take 1 tablet (7.5 mg total) by mouth daily. 30 tablet 0   metoCLOPramide (REGLAN) 5 MG tablet Take 1 tablet (5 mg total) by mouth every 8 (eight) hours as needed for nausea. 20 tablet 0   montelukast (SINGULAIR) 10 MG tablet TAKE ONE TAB BY MOUTH AT BEDTIME 90 tablet 3   pantoprazole (PROTONIX) 40 MG tablet TAKE ONE TAB BY MOUTH 2 TIMES DAILY 180 tablet 3   pioglitazone (ACTOS) 30 MG tablet Take 1 tablet (30 mg total) by mouth daily. 90 tablet 0   sertraline (ZOLOFT) 100 MG tablet Take 2 tablets (200 mg total) by mouth daily. 180 tablet 3   SYMBICORT 160-4.5 MCG/ACT inhaler TAKE 2 PUFFS BY MOUTH TWICE A DAY 1 Inhaler 1   traMADol (ULTRAM) 50 MG tablet Take 1 tablet (50 mg total) by mouth every 8 (eight) hours as needed for severe pain. 90 tablet 5   No current facility-administered medications for this visit.    Allergies as of 01/09/2020 - Review Complete 01/09/2020  Allergen Reaction Noted   Diphenhydramine Other (See Comments) 12/24/2014    Morphine Nausea And Vomiting 12/28/2016   Prednisone Nausea And Vomiting 12/24/2014   Adhesive [tape]  11/03/2019   Erythromycin Hives 07/22/2010   Metformin and related  10/25/2019   Sulfonamide derivatives Other (See Comments) 07/22/2010    Family History  Problem Relation Age of Onset   Mental illness Other    Diabetes Other    Healthy Mother    Colon cancer Neg Hx    Stomach cancer  Neg Hx    Pancreatic cancer Neg Hx     Social History   Socioeconomic History   Marital status: Divorced    Spouse name: Not on file   Number of children: Not on file   Years of education: Not on file   Highest education level: Not on file  Occupational History   Not on file  Tobacco Use   Smoking status: Former Smoker   Smokeless tobacco: Never Used  Scientific laboratory technician Use: Former  Substance and Sexual Activity   Alcohol use: No   Drug use: No   Sexual activity: Yes    Birth control/protection: I.U.D.  Other Topics Concern   Not on file  Social History Narrative   Not on file   Social Determinants of Health   Financial Resource Strain:    Difficulty of Paying Living Expenses: Not on file  Food Insecurity:    Worried About [place] in the Last Year: Not on file   Ran Out of Food in the Last Year: Not on file  Transportation Needs:    Lack of Transportation (Medical): Not on file   Lack of Transportation (Non-Medical): Not on file  Physical Activity:    Days of Exercise per Week: Not on file   Minutes of Exercise per Session: Not on file  Stress:    Feeling of Stress : Not on file  Social Connections:    Frequency of Communication with Friends and Family: Not on file   Frequency of Social Gatherings with Friends and Family: Not on file   Attends Religious Services: Not on file   Active Member of Clubs or Organizations: Not on file   Attends Archivist Meetings: Not on file   Marital Status: Not on file   Intimate Partner Violence:    Fear of Current or Ex-Partner: Not on file   Emotionally Abused: Not on file   Physically Abused: Not on file   Sexually Abused: Not on file    Review of Systems:    Constitutional: No weight loss, fever or chills Skin: No rash Cardiovascular: No chest pain   Respiratory: No SOB  Gastrointestinal: See HPI and otherwise negative Genitourinary: No dysuria Neurological: No headache, dizziness or syncope Musculoskeletal: No new muscle or joint pain Hematologic: No bleeding Psychiatric: No history of depression or anxiety   Physical Exam:  Vital signs: BP 102/74    Pulse 78    Ht 5' 7.5" (1.715 m)    Wt 292 lb 8 oz (132.7 kg)    BMI 45.14 kg/m   Constitutional:   Pleasant overweight Caucasian female appears to be in NAD, Well developed, Well nourished, alert and cooperative Head:  Normocephalic and atraumatic. Eyes:   PEERL, EOMI. No icterus. Conjunctiva pink. Ears:  Normal auditory acuity. Neck:  Supple Throat: Oral cavity and pharynx without inflammation, swelling or lesion.  Respiratory: Respirations even and unlabored. Lungs clear to auscultation bilaterally.   No wheezes, crackles, or rhonchi.  Cardiovascular: Normal S1, S2. No MRG. Regular rate and rhythm. No peripheral edema, cyanosis or pallor.  Gastrointestinal:  Tense, mild distension, moderate epigastric ttp with some involuntary guarding. Decreased BS all four quadrants.  No appreciable masses or hepatomegaly. Rectal:  Not performed.  Msk:  Symmetrical without gross deformities. Without edema, no deformity or joint abnormality.  Neurologic:  Alert and  oriented x4;  grossly normal neurologically.  Skin:   Dry and intact without significant lesions or rashes. Psychiatric:  Demonstrates good judgement and reason without abnormal affect or behaviors.  RELEVANT LABS AND IMAGING: CBC    Component Value Date/Time   WBC 9.3 12/01/2019 1202   WBC 10.3 11/02/2018 2043   RBC 5.49 (H)  12/01/2019 1202   RBC 5.46 (H) 11/02/2018 2043   HGB 14.4 12/01/2019 1202   HCT 45.6 12/01/2019 1202   PLT 372 12/01/2019 1202   MCV 83 12/01/2019 1202   MCH 26.2 (L) 12/01/2019 1202   MCH 26.6 11/02/2018 2043   MCHC 31.6 12/01/2019 1202   MCHC 32.9 11/02/2018 2043   RDW 13.5 12/01/2019 1202   LYMPHSABS 2.8 12/01/2019 1202   MONOABS 0.9 07/13/2010 1849   EOSABS 0.2 12/01/2019 1202   BASOSABS 0.1 12/01/2019 1202    CMP     Component Value Date/Time   NA 142 12/01/2019 1202   K 4.5 12/01/2019 1202   CL 101 12/01/2019 1202   CO2 28 12/01/2019 1202   GLUCOSE 104 (H) 12/01/2019 1202   GLUCOSE 550 (HH) 11/02/2018 2043   BUN 15 12/01/2019 1202   CREATININE 0.86 12/01/2019 1202   CALCIUM 9.5 12/01/2019 1202   PROT 7.0 12/01/2019 1202   ALBUMIN 4.5 12/01/2019 1202   AST 14 12/01/2019 1202   ALT 9 12/01/2019 1202   ALKPHOS 96 12/01/2019 1202   BILITOT 0.2 12/01/2019 1202   GFRNONAA 80 12/01/2019 1202   GFRAA 92 12/01/2019 1202    Assessment: 1.  Epigastric pain: Feels like punched, regardless of taking Nexium 80 mg in the morning; consider GERD+/-gastritis+/-PUD+/-H. pylori 2.  Bloating: With above; question relation to constipation 3.  Change in bowel habits: Alternates from diarrhea to constipation with associated bloating and abdominal pain ; consider IBS versus other  4.  Dysphagia/GERD: Distant history of achalasia treated with Botox injections by Dr. Deatra Ina; consider achalasia or stricture versus other  Plan: 1.  Scheduled patient for an EGD with possible dilation and colonoscopy in the [place] with Dr. Havery Moros as he had availability.  Did discuss risks, benefits, limitations and alternatives and the patient agrees to proceed.  She has had her Covid vaccines.  Patient will continue to follow with Dr. Havery Moros after time of procedures, he will be her primary GI physician. 2.  Continue Dicyclomine 20 mg twice daily. 3.  Discussed proper dosing with Pantoprazole, taking this  40 mg once in the morning 30-60 minutes before breakfast and 30 to 60 minutes before dinner. 4.  Discussed with patient that if procedures are unrevealing then would recommend a gastric emptying study for further eval. 5.  We provided the patient with a note discussing that we were planning to do these procedures and when.  She tells me that she will give it to her PCP who can then extend her FMLA. 6.  Patient to follow in clinic per recommendations from Dr. Havery Moros after time of procedures.  Ellouise Newer, PA-C [facility] 01/09/2020, 3:21 PM  Cc: Janora Norlander, DO

## 2020-01-09 NOTE — Patient Instructions (Addendum)
If you are age 50 or older, your body mass index should be between 23-30. Your Body mass index is 45.14 kg/m. If this is out of the aforementioned range listed, please consider follow up with your Primary Care Provider.  If you are age 67 or younger, your body mass index should be between 19-25. Your Body mass index is 45.14 kg/m. If this is out of the aformentioned range listed, please consider follow up with your Primary Care Provider.   Continue current medications.   You have been scheduled for an endoscopy and colonoscopy. Please follow the written instructions given to you at your visit today. Please pick up your prep supplies at the pharmacy within the next 1-3 days. If you use inhalers (even only as needed), please bring them with you on the day of your procedure.  Due to recent changes in healthcare laws, you may see the results of your imaging and laboratory studies on MyChart before your provider has had a chance to review them.  We understand that in some cases there may be results that are confusing or concerning to you. Not all laboratory results come back in the same time frame and the provider may be waiting for multiple results in order to interpret others.  Please give [place] 48 hours in order for your provider to thoroughly review all the results before contacting the office for clarification of your results.

## 2020-01-11 ENCOUNTER — Other Ambulatory Visit: Payer: Self-pay | Admitting: Family Medicine

## 2020-01-11 DIAGNOSIS — Z1231 Encounter for screening mammogram for malignant neoplasm of breast: Secondary | ICD-10-CM

## 2020-01-17 ENCOUNTER — Other Ambulatory Visit: Payer: Self-pay | Admitting: *Deleted

## 2020-01-17 DIAGNOSIS — E118 Type 2 diabetes mellitus with unspecified complications: Secondary | ICD-10-CM

## 2020-01-17 DIAGNOSIS — Z794 Long term (current) use of insulin: Secondary | ICD-10-CM

## 2020-01-19 ENCOUNTER — Other Ambulatory Visit: Payer: Self-pay

## 2020-01-19 DIAGNOSIS — Z794 Long term (current) use of insulin: Secondary | ICD-10-CM

## 2020-01-19 DIAGNOSIS — E118 Type 2 diabetes mellitus with unspecified complications: Secondary | ICD-10-CM

## 2020-01-19 DIAGNOSIS — E109 Type 1 diabetes mellitus without complications: Secondary | ICD-10-CM

## 2020-01-19 MED ORDER — INSULIN PEN NEEDLE 31G X 5 MM MISC: 1.0000 [IU] | Freq: Every day | 5 refills | Status: DC

## 2020-01-19 MED ORDER — LANTUS SOLOSTAR 100 UNIT/ML ~~LOC~~ SOPN: 20.0000 [IU] | PEN_INJECTOR | Freq: Every day | SUBCUTANEOUS | 1 refills | Status: DC

## 2020-01-20 ENCOUNTER — Emergency Department (HOSPITAL_COMMUNITY)
Admission: EM | Admit: 2020-01-20 | Discharge: 2020-01-20 | Discharge status: Against Medical Advice | Payer: Medicaid Other

## 2020-01-22 ENCOUNTER — Other Ambulatory Visit: Payer: Self-pay

## 2020-01-22 ENCOUNTER — Emergency Department (INDEPENDENT_AMBULATORY_CARE_PROVIDER_SITE_OTHER)
Admission: RE | Admit: 2020-01-22 | Discharge: 2020-01-22 | Disposition: A | Discharge status: Home / Self Care | Payer: Self-pay | Source: Ambulatory Visit

## 2020-01-22 ENCOUNTER — Emergency Department (INDEPENDENT_AMBULATORY_CARE_PROVIDER_SITE_OTHER): Payer: Medicaid Other

## 2020-01-22 VITALS — BP 132/83 | HR 92 | Temp 99.0°F | Resp 16

## 2020-01-22 DIAGNOSIS — M542 Cervicalgia: Secondary | ICD-10-CM

## 2020-01-22 DIAGNOSIS — M25531 Pain in right wrist: Secondary | ICD-10-CM | POA: Diagnosis not present

## 2020-01-22 DIAGNOSIS — S60211A Contusion of right wrist, initial encounter: Secondary | ICD-10-CM

## 2020-01-22 DIAGNOSIS — M47814 Spondylosis without myelopathy or radiculopathy, thoracic region: Secondary | ICD-10-CM | POA: Diagnosis not present

## 2020-01-22 DIAGNOSIS — M4802 Spinal stenosis, cervical region: Secondary | ICD-10-CM | POA: Diagnosis not present

## 2020-01-22 DIAGNOSIS — M545 Low back pain, unspecified: Secondary | ICD-10-CM

## 2020-01-22 DIAGNOSIS — S301XXA Contusion of abdominal wall, initial encounter: Secondary | ICD-10-CM

## 2020-01-22 DIAGNOSIS — E890 Postprocedural hypothyroidism: Secondary | ICD-10-CM | POA: Diagnosis not present

## 2020-01-22 DIAGNOSIS — R0781 Pleurodynia: Secondary | ICD-10-CM | POA: Diagnosis not present

## 2020-01-22 DIAGNOSIS — M4316 Spondylolisthesis, lumbar region: Secondary | ICD-10-CM | POA: Diagnosis not present

## 2020-01-22 DIAGNOSIS — M2578 Osteophyte, vertebrae: Secondary | ICD-10-CM | POA: Diagnosis not present

## 2020-01-22 DIAGNOSIS — M546 Pain in thoracic spine: Secondary | ICD-10-CM | POA: Diagnosis not present

## 2020-01-22 DIAGNOSIS — Z041 Encounter for examination and observation following transport accident: Secondary | ICD-10-CM | POA: Diagnosis not present

## 2020-01-22 DIAGNOSIS — S20211A Contusion of right front wall of thorax, initial encounter: Secondary | ICD-10-CM

## 2020-01-22 DIAGNOSIS — S161XXA Strain of muscle, fascia and tendon at neck level, initial encounter: Secondary | ICD-10-CM

## 2020-01-22 NOTE — Discharge Instructions (Signed)
  You can continue to take your prescribed muscle relaxer and tramadol for pain. Alternate cool and warm compresses to help with the painful bruising.  Call to schedule a follow up appointment with sports medicine or family medicine later this week if not improving.   Call 911 or have someone drive you to the hospital if symptoms significantly worsening.
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
## 2020-01-24 ENCOUNTER — Ambulatory Visit (INDEPENDENT_AMBULATORY_CARE_PROVIDER_SITE_OTHER): Payer: Medicaid Other | Admitting: Family Medicine

## 2020-01-24 DIAGNOSIS — E1165 Type 2 diabetes mellitus with hyperglycemia: Secondary | ICD-10-CM | POA: Diagnosis not present

## 2020-01-24 DIAGNOSIS — B372 Candidiasis of skin and nail: Secondary | ICD-10-CM

## 2020-01-24 DIAGNOSIS — M5136 Other intervertebral disc degeneration, lumbar region: Secondary | ICD-10-CM | POA: Diagnosis not present

## 2020-01-24 DIAGNOSIS — E039 Hypothyroidism, unspecified: Secondary | ICD-10-CM | POA: Diagnosis not present

## 2020-01-24 DIAGNOSIS — Z794 Long term (current) use of insulin: Secondary | ICD-10-CM

## 2020-01-24 MED ORDER — NYSTATIN 100000 UNIT/GM EX CREA: 1.0000 "application " | TOPICAL_CREAM | Freq: Two times a day (BID) | CUTANEOUS | 0 refills | Status: DC
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
## 2020-01-25 ENCOUNTER — Other Ambulatory Visit: Payer: Self-pay | Admitting: *Deleted

## 2020-01-25 DIAGNOSIS — Z794 Long term (current) use of insulin: Secondary | ICD-10-CM

## 2020-01-25 DIAGNOSIS — E118 Type 2 diabetes mellitus with unspecified complications: Secondary | ICD-10-CM

## 2020-02-05 ENCOUNTER — Other Ambulatory Visit: Payer: Self-pay

## 2020-02-05 ENCOUNTER — Other Ambulatory Visit: Payer: Self-pay | Admitting: Family Medicine

## 2020-02-05 ENCOUNTER — Encounter: Payer: Self-pay | Admitting: Family Medicine

## 2020-02-05 ENCOUNTER — Telehealth: Payer: Self-pay | Admitting: Family Medicine

## 2020-02-05 ENCOUNTER — Other Ambulatory Visit: Payer: Self-pay | Admitting: *Deleted

## 2020-02-05 ENCOUNTER — Ambulatory Visit: Payer: Medicaid Other | Admitting: Family Medicine

## 2020-02-05 VITALS — BP 122/78 | Ht 68.0 in | Wt 293.0 lb

## 2020-02-05 DIAGNOSIS — M545 Low back pain, unspecified: Secondary | ICD-10-CM | POA: Insufficient documentation

## 2020-02-05 DIAGNOSIS — M546 Pain in thoracic spine: Secondary | ICD-10-CM | POA: Insufficient documentation

## 2020-02-05 DIAGNOSIS — M542 Cervicalgia: Secondary | ICD-10-CM

## 2020-02-05 DIAGNOSIS — Z23 Encounter for immunization: Secondary | ICD-10-CM | POA: Diagnosis not present

## 2020-02-05 DIAGNOSIS — S060X0A Concussion without loss of consciousness, initial encounter: Secondary | ICD-10-CM | POA: Diagnosis not present

## 2020-02-05 DIAGNOSIS — M47812 Spondylosis without myelopathy or radiculopathy, cervical region: Secondary | ICD-10-CM | POA: Insufficient documentation

## 2020-02-05 DIAGNOSIS — J4521 Mild intermittent asthma with (acute) exacerbation: Secondary | ICD-10-CM

## 2020-02-05 MED ORDER — IBUPROFEN 600 MG PO TABS: 600.0000 mg | ORAL_TABLET | Freq: Three times a day (TID) | ORAL | 1 refills | Status: DC | PRN

## 2020-02-05 MED ORDER — SYMBICORT 160-4.5 MCG/ACT IN AERO: INHALATION_SPRAY | RESPIRATORY_TRACT | 0 refills | Status: DC

## 2020-02-05 MED ORDER — HYDROCODONE-ACETAMINOPHEN 5-325 MG PO TABS: 1.0000 | ORAL_TABLET | Freq: Three times a day (TID) | ORAL | 0 refills | Status: DC | PRN
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
## 2020-02-06 ENCOUNTER — Other Ambulatory Visit: Payer: Self-pay | Admitting: Family

## 2020-02-06 DIAGNOSIS — E118 Type 2 diabetes mellitus with unspecified complications: Secondary | ICD-10-CM

## 2020-02-06 DIAGNOSIS — Z794 Long term (current) use of insulin: Secondary | ICD-10-CM

## 2020-02-06 NOTE — Telephone Encounter (Signed)
Returned call to patient  No answer, left VM

## 2020-02-07 ENCOUNTER — Encounter: Payer: Self-pay | Admitting: Gastroenterology

## 2020-02-07 ENCOUNTER — Other Ambulatory Visit: Payer: Self-pay

## 2020-02-07 ENCOUNTER — Ambulatory Visit (AMBULATORY_SURGERY_CENTER): Payer: Medicaid Other | Admitting: Gastroenterology

## 2020-02-07 VITALS — BP 119/72 | HR 84 | Temp 97.1°F | Resp 17 | Ht 67.0 in | Wt 292.0 lb

## 2020-02-07 DIAGNOSIS — K319 Disease of stomach and duodenum, unspecified: Secondary | ICD-10-CM | POA: Diagnosis not present

## 2020-02-07 DIAGNOSIS — R1310 Dysphagia, unspecified: Secondary | ICD-10-CM

## 2020-02-07 DIAGNOSIS — K648 Other hemorrhoids: Secondary | ICD-10-CM

## 2020-02-07 DIAGNOSIS — K3189 Other diseases of stomach and duodenum: Secondary | ICD-10-CM | POA: Diagnosis not present

## 2020-02-07 DIAGNOSIS — K219 Gastro-esophageal reflux disease without esophagitis: Secondary | ICD-10-CM

## 2020-02-07 DIAGNOSIS — E119 Type 2 diabetes mellitus without complications: Secondary | ICD-10-CM | POA: Diagnosis not present

## 2020-02-07 DIAGNOSIS — J45909 Unspecified asthma, uncomplicated: Secondary | ICD-10-CM | POA: Diagnosis not present

## 2020-02-07 DIAGNOSIS — R194 Change in bowel habit: Secondary | ICD-10-CM | POA: Diagnosis not present

## 2020-02-07 DIAGNOSIS — E785 Hyperlipidemia, unspecified: Secondary | ICD-10-CM | POA: Diagnosis not present

## 2020-02-07 DIAGNOSIS — D122 Benign neoplasm of ascending colon: Secondary | ICD-10-CM | POA: Diagnosis not present

## 2020-02-07 DIAGNOSIS — D120 Benign neoplasm of cecum: Secondary | ICD-10-CM | POA: Diagnosis not present

## 2020-02-07 DIAGNOSIS — D123 Benign neoplasm of transverse colon: Secondary | ICD-10-CM | POA: Diagnosis not present

## 2020-02-07 DIAGNOSIS — R1013 Epigastric pain: Secondary | ICD-10-CM | POA: Diagnosis not present

## 2020-02-07 DIAGNOSIS — I10 Essential (primary) hypertension: Secondary | ICD-10-CM | POA: Diagnosis not present

## 2020-02-07 DIAGNOSIS — F319 Bipolar disorder, unspecified: Secondary | ICD-10-CM | POA: Diagnosis not present

## 2020-02-07 MED ORDER — SODIUM CHLORIDE 0.9 % IV SOLN: 500.0000 mL | Freq: Once | INTRAVENOUS | Status: DC

## 2020-02-07 NOTE — Op Note (Signed)
[facility] Patient Name: Judith [name] Procedure Date: 02/07/2020 3:45 PM MRN: [id number] Endoscopist: Remo Lipps P. Havery Moros , MD Age: 49 Referring MD:  Date of Birth: 03-24-1971 Gender: Female Account #: [id number] Procedure:                Upper GI endoscopy Indications:              Dysphagia, Follow-up of gastro-esophageal reflux                            disease, Early satiety / epigastric discomfort,                            history of diabetes, GERD now controlled with                            protonix twice daily Medicines:                Monitored Anesthesia Care Procedure:                Pre-Anesthesia Assessment:                           - Prior to the procedure, a History and Physical                            was performed, and patient medications and                            allergies were reviewed. The patient's tolerance of                            previous anesthesia was also reviewed. The risks                            and benefits of the procedure and the sedation                            options and risks were discussed with the patient.                            All questions were answered, and informed consent                            was obtained. Prior Anticoagulants: The patient has                            taken no previous anticoagulant or antiplatelet                            agents. ASA Grade Assessment: III - A patient with                            severe systemic disease. After reviewing the risks  and benefits, the patient was deemed in                            satisfactory condition to undergo the procedure.                           After obtaining informed consent, the endoscope was                            passed under direct vision. Throughout the                            procedure, the patient's blood pressure, pulse, and                            oxygen saturations were monitored  continuously. The                            Endoscope was introduced through the mouth, and                            advanced to the second part of duodenum. The upper                            GI endoscopy was accomplished without difficulty.                            The patient tolerated the procedure well. Scope In: Scope Out: Findings:                 Esophagogastric landmarks were identified: the                            Z-line was found at 38 cm, the gastroesophageal                            junction was found at 38 cm and the upper extent of                            the gastric folds was found at 39 cm from the                            incisors.                           A 1 cm hiatal hernia was present.                           The exam of the esophagus was otherwise normal. No                            obvious stenosis / stricture or inflammatory  changes noted.                           A guidewire was placed and the scope was withdrawn.                            Empiric dilation was performed in the entire                            esophagus with a Savary dilator with mild                            resistance at 17 mm and 18 mm. Relook endoscopy                            showed no mucosal wrents. Biopsies were taken with                            a cold forceps in the upper third of the esophagus,                            in the middle third of the esophagus and in the                            lower third of the esophagus for histology.                           The entire examined stomach was normal. Biopsies                            were taken with a cold forceps for Helicobacter                            pylori testing.                           The duodenal bulb and second portion of the                            duodenum were normal. Complications:            No immediate complications. Estimated blood loss:                             Minimal. Estimated Blood Loss:     Estimated blood loss was minimal. Impression:               - Esophagogastric landmarks identified.                           - 1 cm hiatal hernia.                           - Normal esophagus otherwise - empiric dilation  performed to 77mm and biopsies taken to rule out EoE                           - Normal stomach. Biopsied.                           - Normal duodenal bulb and second portion of the                            duodenum. Recommendation:           - Patient has a contact number available for                            emergencies. The signs and symptoms of potential                            delayed complications were discussed with the                            patient. Return to normal activities tomorrow.                            Written discharge instructions were provided to the                            patient.                           - Resume previous diet.                           - Continue present medications.                           - Await pathology results and course post dilation                           - If early satiety / epigastric discomfort persist,                            consider empiric trial of Reglan if not yet tried                            (mildly delayed emptying on gastric emptying study                            in 2008) [place]. Havery Moros, MD 02/07/2020 4:29:45 PM This report has been signed electronically.

## 2020-02-07 NOTE — Progress Notes (Signed)
Pt's states no medical or surgical changes since previsit or office visit. 

## 2020-02-07 NOTE — Progress Notes (Signed)
Report to PACU, RN, vss, BBS= Clear.  

## 2020-02-07 NOTE — Patient Instructions (Signed)
YOU HAD AN ENDOSCOPIC PROCEDURE TODAY AT THE [facility]:   Refer to the procedure report that was given to you for any specific questions about what was found during the examination.  If the procedure report does not answer your questions, please call your gastroenterologist to clarify.  If you requested that your care partner not be given the details of your procedure findings, then the procedure report has been included in a sealed envelope for you to review at your convenience later.  YOU SHOULD EXPECT: Some feelings of bloating in the abdomen. Passage of more gas than usual.  Walking can help get rid of the air that was put into your GI tract during the procedure and reduce the bloating. If you had a lower endoscopy (such as a colonoscopy or flexible sigmoidoscopy) you may notice spotting of blood in your stool or on the toilet paper. If you underwent a bowel prep for your procedure, you may not have a normal bowel movement for a few days.  **Handouts given on polyps and hemorrhoids**  Please Note:  You might notice some irritation and congestion in your nose or some drainage.  This is from the oxygen used during your procedure.  There is no need for concern and it should clear up in a day or so.  SYMPTOMS TO REPORT IMMEDIATELY:  Following lower endoscopy (colonoscopy or flexible sigmoidoscopy):  Excessive amounts of blood in the stool  Significant tenderness or worsening of abdominal pains  Swelling of the abdomen that is new, acute  Fever of 100F or higher  Following upper endoscopy (EGD)  Vomiting of blood or coffee ground material  New chest pain or pain under the shoulder blades  Painful or persistently difficult swallowing  New shortness of breath  Fever of 100F or higher  Black, tarry-looking stools  For urgent or emergent issues, a gastroenterologist can be reached at any hour by calling [phone]. Do not use MyChart messaging for urgent concerns.    DIET:   We do recommend a small meal at first, but then you may proceed to your regular diet.  Drink plenty of fluids but you should avoid alcoholic beverages for 24 hours.  ACTIVITY:  You should plan to take it easy for the rest of today and you should NOT DRIVE or use heavy machinery until tomorrow (because of the sedation medicines used during the test).    FOLLOW UP: Our staff will call the number listed on your records 48-72 hours following your procedure to check on you and address any questions or concerns that you may have regarding the information given to you following your procedure. If we do not reach you, we will leave a message.  We will attempt to reach you two times.  During this call, we will ask if you have developed any symptoms of COVID 19. If you develop any symptoms (ie: fever, flu-like symptoms, shortness of breath, cough etc.) before then, please call [phone].  If you test positive for Covid 19 in the 2 weeks post procedure, please call and report this information to us.    If any biopsies were taken you will be contacted by phone or by letter within the next 1-3 weeks.  Please call us at [phone] if you have not heard about the biopsies in 3 weeks.    SIGNATURES/CONFIDENTIALITY: You and/or your care partner have signed paperwork which will be entered into your electronic medical record.  These signatures attest to the fact that   that the information above on your After Visit Summary has been reviewed and is understood.  Full responsibility of the confidentiality of this discharge information lies with you and/or your care-partner.  

## 2020-02-07 NOTE — Op Note (Signed)
[facility] Patient Name: Judith [name] Procedure Date: 02/07/2020 3:44 PM MRN: [id number] Endoscopist: Remo Lipps P. Havery Moros , MD Age: 49 Referring MD:  Date of Birth: [birth year] Gender: Female Account #: [id number] Procedure:                Colonoscopy Indications:              Change in bowel habits (alternating constipation                            and loose stools) Medicines:                Monitored Anesthesia Care Procedure:                Pre-Anesthesia Assessment:                           - Prior to the procedure, a History and Physical                            was performed, and patient medications and                            allergies were reviewed. The patient's tolerance of                            previous anesthesia was also reviewed. The risks                            and benefits of the procedure and the sedation                            options and risks were discussed with the patient.                            All questions were answered, and informed consent                            was obtained. Prior Anticoagulants: The patient has                            taken no previous anticoagulant or antiplatelet                            agents. ASA Grade Assessment: III - A patient with                            severe systemic disease. After reviewing the risks                            and benefits, the patient was deemed in                            satisfactory condition to undergo the procedure.  After obtaining informed consent, the colonoscope                            was passed under direct vision. Throughout the                            procedure, the patient's blood pressure, pulse, and                            oxygen saturations were monitored continuously. The                            Colonoscope was introduced through the anus and                            advanced to the the terminal ileum,  with                            identification of the appendiceal orifice and IC                            valve. The colonoscopy was performed without                            difficulty. The patient tolerated the procedure                            well. The quality of the bowel preparation was                            good. The terminal ileum, ileocecal valve,                            appendiceal orifice, and rectum were photographed. Scope In: 4:04:42 PM Scope Out: 4:19:10 PM Scope Withdrawal Time: 0 hours 13 minutes 20 seconds  Total Procedure Duration: 0 hours 14 minutes 28 seconds  Findings:                 The perianal and digital rectal examinations were                            normal.                           The terminal ileum appeared normal.                           Two sessile polyps were found in the cecum. The                            polyps were 3 mm in size. These polyps were removed                            with a cold snare. Resection and retrieval were  complete.                           A 3 mm polyp was found in the ascending colon. The                            polyp was sessile. The polyp was removed with a                            cold snare. Resection and retrieval were complete.                           A 3 mm polyp was found in the transverse colon. The                            polyp was sessile. The polyp was removed with a                            cold snare. Resection and retrieval were complete.                           Internal hemorrhoids were found during retroflexion.                           The exam was otherwise without abnormality.                           Biopsies for histology were taken with a cold                            forceps from the right colon and left colon for                            evaluation of microscopic colitis. Complications:            No immediate complications.  Estimated blood loss:                            Minimal. Estimated Blood Loss:     Estimated blood loss was minimal. Impression:               - The examined portion of the ileum was normal.                           - Two 3 mm polyps in the cecum, removed with a cold                            snare. Resected and retrieved.                           - One 3 mm polyp in the ascending colon, removed                            with a cold snare. Resected  and retrieved.                           - One 3 mm polyp in the transverse colon, removed                            with a cold snare. Resected and retrieved.                           - Internal hemorrhoids.                           - The examination was otherwise normal.                           - Biopsies were taken with a cold forceps from the                            right colon and left colon for evaluation of                            microscopic colitis. Recommendation:           - Patient has a contact number available for                            emergencies. The signs and symptoms of potential                            delayed complications were discussed with the                            patient. Return to normal activities tomorrow.                            Written discharge instructions were provided to the                            patient.                           - Resume previous diet.                           - Continue present medications.                           - Await pathology results.                           - Recommend trial of Citrucel once daily to help                            provide regularity to bowel habits Remo Lipps P. Havery Moros, MD 02/07/2020 4:23:54 PM This report has been signed electronically.

## 2020-02-08 ENCOUNTER — Telehealth: Payer: Self-pay | Admitting: Family Medicine

## 2020-02-09 ENCOUNTER — Telehealth: Payer: Self-pay

## 2020-02-09 ENCOUNTER — Telehealth: Payer: Self-pay | Admitting: Family Medicine

## 2020-02-09 NOTE — Telephone Encounter (Signed)
Pt needing updated leave date on FMLA, for 2-3 more weeks, waiting on test results from GI form placed on providers desk

## 2020-02-09 NOTE — Telephone Encounter (Signed)
Pt wants to speak with [facility] asap regarding her FMLA.   Says she had a procedure done a few days ago and they have to wait on her test results which will take a few weeks to get back.   Also wanted to give new Fax# to send FMLA paperwork to, which is [phone]

## 2020-02-09 NOTE — Telephone Encounter (Signed)
°  Follow up Call-  Call back number 02/07/2020  Post procedure Call Back phone  # [phone]  Permission to leave phone message Yes  Some recent data might be hidden     Patient questions:  Do you have a fever, pain , or abdominal swelling? No. Pain Score  0 *  Have you tolerated food without any problems? Yes.    Have you been able to return to your normal activities? Yes.    Do you have any questions about your discharge instructions: Diet   No. Medications  No. Follow up visit  No.  Do you have questions or concerns about your Care? No.  Actions: * If pain score is 4 or above: No action needed, pain <4.  1. Have you developed a fever since your procedure? no  2.   Have you had an respiratory symptoms (SOB or cough) since your procedure? no  3.   Have you tested positive for COVID 19 since your procedure no  4.   Have you had any family members/close contacts diagnosed with the COVID 19 since your procedure?  no   If yes to any of these questions please route to Joylene John, RN and Joella Prince, RN

## 2020-02-13 ENCOUNTER — Ambulatory Visit: Payer: Medicaid Other | Admitting: Physical Therapy

## 2020-02-13 ENCOUNTER — Ambulatory Visit: Payer: Self-pay | Admitting: Rehabilitative and Restorative Service Providers"

## 2020-02-13 ENCOUNTER — Other Ambulatory Visit: Payer: Self-pay

## 2020-02-14 ENCOUNTER — Encounter: Payer: Self-pay | Admitting: Physical Therapy

## 2020-02-14 ENCOUNTER — Ambulatory Visit: Payer: Medicaid Other | Attending: Family Medicine | Admitting: Physical Therapy

## 2020-02-14 DIAGNOSIS — M545 Low back pain, unspecified: Secondary | ICD-10-CM

## 2020-02-14 DIAGNOSIS — M542 Cervicalgia: Secondary | ICD-10-CM

## 2020-02-14 DIAGNOSIS — M6281 Muscle weakness (generalized): Secondary | ICD-10-CM

## 2020-02-14 DIAGNOSIS — M546 Pain in thoracic spine: Secondary | ICD-10-CM | POA: Diagnosis not present
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
## 2020-02-16 ENCOUNTER — Emergency Department (INDEPENDENT_AMBULATORY_CARE_PROVIDER_SITE_OTHER)
Admission: RE | Admit: 2020-02-16 | Discharge: 2020-02-16 | Disposition: A | Discharge status: Home / Self Care | Payer: Medicaid Other | Source: Ambulatory Visit

## 2020-02-16 ENCOUNTER — Other Ambulatory Visit: Payer: Self-pay

## 2020-02-16 ENCOUNTER — Encounter: Payer: Medicaid Other | Admitting: Physical Therapy

## 2020-02-16 VITALS — BP 142/89 | HR 90 | Temp 98.7°F | Resp 18

## 2020-02-16 DIAGNOSIS — J209 Acute bronchitis, unspecified: Secondary | ICD-10-CM | POA: Diagnosis not present

## 2020-02-16 MED ORDER — ALBUTEROL SULFATE (2.5 MG/3ML) 0.083% IN NEBU: 2.5000 mg | INHALATION_SOLUTION | Freq: Four times a day (QID) | RESPIRATORY_TRACT | 12 refills | Status: DC | PRN

## 2020-02-16 MED ORDER — HYDROCODONE-HOMATROPINE 5-1.5 MG/5ML PO SYRP: 5.0000 mL | ORAL_SOLUTION | Freq: Every evening | ORAL | 0 refills | Status: DC | PRN

## 2020-02-16 MED ORDER — DEXAMETHASONE SODIUM PHOSPHATE 10 MG/ML IJ SOLN
10.0000 mg | Freq: Once | INTRAMUSCULAR | Status: AC
  Administered 2020-02-16: 10 mg via INTRAMUSCULAR

## 2020-02-16 MED ORDER — DOXYCYCLINE HYCLATE 100 MG PO CAPS: 100.0000 mg | ORAL_CAPSULE | Freq: Two times a day (BID) | ORAL | 0 refills | Status: DC
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
## 2020-02-16 NOTE — Telephone Encounter (Signed)
Update faxed 02/15/20 to the [phone], confirmation log received

## 2020-02-19 ENCOUNTER — Encounter: Payer: Medicaid Other | Admitting: Physical Therapy

## 2020-02-20 ENCOUNTER — Ambulatory Visit: Payer: Medicaid Other | Attending: Family Medicine | Admitting: *Deleted

## 2020-02-20 ENCOUNTER — Other Ambulatory Visit: Payer: Self-pay

## 2020-02-20 DIAGNOSIS — M545 Low back pain, unspecified: Secondary | ICD-10-CM

## 2020-02-20 DIAGNOSIS — M542 Cervicalgia: Secondary | ICD-10-CM | POA: Diagnosis not present

## 2020-02-20 DIAGNOSIS — M546 Pain in thoracic spine: Secondary | ICD-10-CM | POA: Diagnosis not present

## 2020-02-20 DIAGNOSIS — M6281 Muscle weakness (generalized): Secondary | ICD-10-CM
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
## 2020-02-21 ENCOUNTER — Encounter: Payer: Medicaid Other | Admitting: Physical Therapy

## 2020-02-21 ENCOUNTER — Other Ambulatory Visit: Payer: Self-pay | Admitting: Family

## 2020-02-22 ENCOUNTER — Ambulatory Visit: Payer: Medicaid Other | Admitting: Physical Therapy

## 2020-02-22 ENCOUNTER — Other Ambulatory Visit: Payer: Self-pay | Admitting: *Deleted

## 2020-02-22 ENCOUNTER — Other Ambulatory Visit: Payer: Self-pay

## 2020-02-22 ENCOUNTER — Other Ambulatory Visit: Payer: Self-pay | Admitting: Family

## 2020-02-22 ENCOUNTER — Emergency Department (INDEPENDENT_AMBULATORY_CARE_PROVIDER_SITE_OTHER)
Admission: RE | Admit: 2020-02-22 | Discharge: 2020-02-22 | Disposition: A | Discharge status: Home / Self Care | Payer: Medicaid Other | Source: Ambulatory Visit

## 2020-02-22 ENCOUNTER — Telehealth: Payer: Self-pay

## 2020-02-22 VITALS — BP 122/81 | HR 93 | Temp 98.0°F | Ht 67.0 in | Wt 300.0 lb

## 2020-02-22 DIAGNOSIS — E118 Type 2 diabetes mellitus with unspecified complications: Secondary | ICD-10-CM

## 2020-02-22 DIAGNOSIS — R059 Cough, unspecified: Secondary | ICD-10-CM

## 2020-02-22 DIAGNOSIS — J069 Acute upper respiratory infection, unspecified: Secondary | ICD-10-CM | POA: Diagnosis not present

## 2020-02-22 DIAGNOSIS — J9801 Acute bronchospasm: Secondary | ICD-10-CM

## 2020-02-22 MED ORDER — METHYLPREDNISOLONE SODIUM SUCC 40 MG IJ SOLR
80.0000 mg | Freq: Once | INTRAMUSCULAR | Status: AC
  Administered 2020-02-22: 80 mg via INTRAMUSCULAR

## 2020-02-22 NOTE — ED Provider Notes (Signed)
[name]    CSN: [id number] Arrival date & time: 02/22/20  1142      History   Chief Complaint Chief Complaint  Patient presents with  . Cough    HPI Judith [name] is a 49 y.o. female.   HPI Judith [name] is a 49 y.o. female presenting to UC with c/o continued cough despite being on last day of doxycycline prescribed last week for productive cough. While she was having yellow sputum last week, that has since improved to minimal amount of clear sputum, the cough continues to keep her up at night. Cough is usually dry now.  Associated chest soreness and bilateral ear pain from coughing. Denies fever, chills, nausea or diarrhea. She has had some post-tussive vomiting.   Pt states she cannot be seen by her PCP because she is having "covid symptoms of cough."  Fully vaccinated with Moderna vaccine. Declined COVID testing because she is in PT and does not want to be turned away.   Her son was sick last week, dx with a sinus infection. He tested negative for COVID.  Past Medical History:  Diagnosis Date  . Asthma   . Bipolar 1 disorder ([place])   . Depression   . GERD (gastroesophageal reflux disease)   . Hyperlipemia   . Hypothyroid   . Migraines     Patient Active Problem List   Diagnosis Date Noted  . Concussion with no loss of consciousness 02/05/2020  . Neck pain 02/05/2020  . Acute bilateral thoracic back pain 02/05/2020  . Acute bilateral low back pain without sciatica 02/05/2020  . Gastroparesis 11/03/2019  . Hospital discharge follow-up 11/03/2019  . DDD (degenerative disc disease), lumbar 06/30/2019  . Lumbar back pain with radiculopathy affecting lower extremity 06/20/2019  . 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/21/2019  . 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 History of left knee replacement 02/10/2018  . Morbid obesity ([place]) 11/21/2017  . Primary osteoarthritis of left knee 09/22/2017  . Primary osteoarthritis of right knee 09/22/2017   . 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 Episodic mood disorder ([place]) 05/12/2016  . Controlled diabetes mellitus type 2 with complications ([place]) 78/93/8101  . Gastroesophageal reflux disease without esophagitis 05/12/2016  . DYSPNEA 07/22/2010  . CHEST PAIN 07/22/2010  . Hypothyroidism 09/13/2007  . Hyperlipidemia 09/13/2007  . Depression, major, single episode, moderate ([place]) 09/13/2007  . MIGRAINE HEADACHE 09/13/2007  . Essential hypertension 09/13/2007  . ASTHMA 09/13/2007  . CHOLELITHIASIS, HX OF 09/13/2007  . NEPHROLITHIASIS, HX OF 09/13/2007  . CARPAL TUNNEL RELEASE, HX OF 09/13/2007    Past Surgical History:  Procedure Laterality Date  . ABDOMINAL SURGERY    . CARPAL TUNNEL RELEASE Bilateral 1999  . CESAREAN SECTION    . CHOLECYSTECTOMY    . THYROIDECTOMY    . TONSILLECTOMY      OB History    Gravida  1   Para  1   Term  1   Preterm      AB      Living        SAB      TAB      Ectopic      Multiple      Live Births               Home Medications    Prior to Admission medications   Medication Sig Start Date End Date Taking? Authorizing Provider  Accu-Chek FastClix Lancets MISC Test  blood sugars daily Dx E11.9 11/03/19   Ronnie Doss M, DO  albuterol (PROVENTIL) (2.5 MG/3ML) 0.083% nebulizer solution Nebulize 1 vial three times daily 07/31/19   Evelina Dun A, FNP  albuterol (PROVENTIL) (2.5 MG/3ML) 0.083% nebulizer solution Take 3 mLs (2.5 mg total) by nebulization every 6 (six) hours as needed for wheezing or shortness of breath. 02/16/20   Scot Jun, FNP  albuterol (VENTOLIN HFA) 108 (90 Base) MCG/ACT inhaler Inhale 2 puffs into the lungs every 6 (six) hours as needed for wheezing or shortness of breath. 07/31/19   Sharion Balloon, FNP  atorvastatin (LIPITOR) 10 MG tablet Take 1 tablet (10 mg total) by mouth daily. 09/19/19   Sharion Balloon, FNP  blood glucose meter kit and supplies Dispense based  on patient and insurance preference. Check blood sugar bid and as needed. Dx: E11.9 01/17/19   Terald Sleeper, PA-C  Continuous Blood Gluc Receiver (FREESTYLE LIBRE 2 READER) DEVI USE TO SCAN BLOOD SUGAR AS DIRECTED UP TO 6 TIMES DAILY. DX: E11.9 02/05/20   Janora Norlander, DO  Continuous Blood Gluc Sensor (FREESTYLE LIBRE 2 SENSOR) MISC Use to check blood sugar as directed up to 6 times daily. DX: E11.9. 2 sensors=30 day supply, 6 sensors=90 day supply 11/06/19   Ronnie Doss M, DO  cyclobenzaprine (FLEXERIL) 10 MG tablet Take 1 tablet (10 mg total) by mouth 3 (three) times daily as needed for muscle spasms. 09/19/19   Evelina Dun A, FNP  diclofenac Sodium (VOLTAREN) 1 % GEL Apply 4 g topically 4 (four) times daily. For knee pain 10/20/19   Ronnie Doss M, DO  dicyclomine (BENTYL) 20 MG tablet Take 1 tablet (20 mg total) by mouth every 6 (six) hours. 09/19/19   Sharion Balloon, FNP  doxycycline (VIBRAMYCIN) 100 MG capsule Take 1 capsule (100 mg total) by mouth 2 (two) times daily. 02/16/20   Scot Jun, FNP  glucose blood (ACCU-CHEK GUIDE) test strip Use as instructed E11.9 11/03/19   Janora Norlander, DO  HYDROcodone-acetaminophen (NORCO/VICODIN) 5-325 MG tablet Take 1 tablet by mouth every 8 (eight) hours as needed. 02/05/20   Rosemarie Ax, MD  HYDROcodone-homatropine [facility]) 5-1.5 MG/5ML syrup Take 5 mLs by mouth at bedtime as needed for cough. 02/16/20   Scot Jun, FNP  hydrOXYzine (ATARAX/VISTARIL) 25 MG tablet Take 1 tablet (25 mg total) by mouth 3 (three) times daily. 10/20/19   Janora Norlander, DO  ibuprofen (ADVIL) 600 MG tablet Take 1 tablet (600 mg total) by mouth every 8 (eight) hours as needed. 02/05/20   Rosemarie Ax, MD  insulin glargine (LANTUS SOLOSTAR) 100 UNIT/ML Solostar Pen Inject 20-50 Units into the skin daily. 01/19/20   Janora Norlander, DO  Insulin Pen Needle 31G X 5 MM MISC 1 Units by Does not apply route daily. 01/19/20   Gottschalk, Ashly  M, DO  JANUVIA 50 MG tablet Take 50 mg by mouth daily. 01/18/20   [provider]  lamoTRIgine (LAMICTAL) 150 MG tablet Take 1 tablet (150 mg total) by mouth 2 (two) times daily. 09/19/19   Sharion Balloon, FNP  levothyroxine (SYNTHROID) 125 MCG tablet Take 1 tablet (125 mcg total) by mouth daily. 09/19/19   Sharion Balloon, FNP  lisdexamfetamine (VYVANSE) 70 MG capsule Take 1 capsule (70 mg total) by mouth daily. 09/19/19   Evelina Dun A, FNP  lisinopril (ZESTRIL) 20 MG tablet Take 1 tablet (20 mg total) by mouth daily. 09/19/19  [place], Christy A, FNP  meloxicam (MOBIC) 7.5 MG tablet Take 1 tablet (7.5 mg total) by mouth daily. 09/19/19   Evelina Dun A, FNP  metoCLOPramide (REGLAN) 5 MG tablet TAKE 1 TABLET (5 MG TOTAL) BY MOUTH EVERY 8 (EIGHT) HOURS AS NEEDED FOR NAUSEA. 02/22/20   Ronnie Doss M, DO  montelukast (SINGULAIR) 10 MG tablet TAKE ONE TAB BY MOUTH AT BEDTIME 09/19/19   [place], Shevlin A, FNP  nystatin cream (MYCOSTATIN) Apply 1 application topically 2 (two) times daily. x2 weeks 01/24/20   Ronnie Doss M, DO  pantoprazole (PROTONIX) 40 MG tablet TAKE ONE TAB BY MOUTH 2 TIMES DAILY 09/19/19   Evelina Dun A, FNP  pioglitazone (ACTOS) 30 MG tablet TAKE 1 TABLET BY MOUTH EVERY DAY 02/06/20   Ronnie Doss M, DO  sertraline (ZOLOFT) 100 MG tablet Take 2 tablets (200 mg total) by mouth daily. 09/19/19   Sharion Balloon, FNP  SYMBICORT 160-4.5 MCG/ACT inhaler TAKE 2 PUFFS BY MOUTH TWICE A DAY 02/05/20   Ronnie Doss M, DO  traMADol (ULTRAM) 50 MG tablet Take 1 tablet (50 mg total) by mouth every 8 (eight) hours as needed for severe pain. 09/19/19   Sharion Balloon, FNP    Family History Family History  Problem Relation Age of Onset  . Mental illness Other   . Diabetes Other   . Healthy Mother   . Colon cancer Neg Hx   . Stomach cancer Neg Hx   . Pancreatic cancer Neg Hx     Social History Social History   Tobacco Use  . Smoking status: Former Research scientist (life sciences)  .  Smokeless tobacco: Never Used  Vaping Use  . Vaping Use: Former  Substance Use Topics  . Alcohol use: No  . Drug use: No     Allergies   Diphenhydramine, Morphine, Prednisone, Adhesive [tape], Erythromycin, Metformin and related, and Sulfonamide derivatives   Review of Systems Review of Systems  Constitutional: Negative for chills and fever.  HENT: Positive for congestion and ear pain. Negative for sore throat, trouble swallowing and voice change.   Respiratory: Positive for cough. Negative for shortness of breath.   Cardiovascular: Negative for chest pain and palpitations.  Gastrointestinal: Negative for abdominal pain, diarrhea, nausea and vomiting.  Musculoskeletal: Negative for arthralgias, back pain and myalgias.  Skin: Negative for rash.  All other systems reviewed and are negative.    Physical Exam Triage Vital Signs ED Triage Vitals  Enc Vitals Group     BP 02/22/20 1159 122/81     Pulse Rate 02/22/20 1159 93     Resp --      Temp 02/22/20 1159 98 F (36.7 C)     Temp Source 02/22/20 1159 Oral     SpO2 02/22/20 1159 96 %     Weight 02/22/20 1200 300 lb (136.1 kg)     Height 02/22/20 1200 _0  (1.702 m)     Head Circumference --      Peak Flow --      Pain Score 02/22/20 1159 7     Pain Loc --      Pain Edu? --      Excl. in [place]? --    No data found.  Updated Vital Signs BP 122/81 (BP Location: Right Arm)   Pulse 93   Temp 98 F (36.7 C) (Oral)   Ht _1  (1.702 m)   Wt 300 lb (136.1 kg)   SpO2 96%   BMI 46.99 kg/m  Visual Acuity Right Eye Distance:   Left Eye Distance:   Bilateral Distance:    Right Eye Near:   Left Eye Near:    Bilateral Near:     Physical Exam Vitals and nursing note reviewed.  Constitutional:      General: She is not in acute distress.    Appearance: Normal appearance. She is well-developed. She is obese. She is not ill-appearing, toxic-appearing or diaphoretic.  HENT:     Head: Normocephalic and atraumatic.      Right Ear: Tympanic membrane and ear canal normal.     Left Ear: Tympanic membrane and ear canal normal.     Nose: Nose normal.     Mouth/Throat:     Mouth: Mucous membranes are moist.     Pharynx: Oropharynx is clear.  Cardiovascular:     Rate and Rhythm: Normal rate and regular rhythm.  Pulmonary:     Effort: Pulmonary effort is normal. No respiratory distress.     Breath sounds: Normal breath sounds. No stridor. No wheezing, rhonchi or rales.     Comments: Occasional dry cough on exam. No respiratory distress. Musculoskeletal:        General: Normal range of motion.     Cervical back: Normal range of motion and neck supple. No tenderness.  Lymphadenopathy:     Cervical: No cervical adenopathy.  Skin:    General: Skin is warm and dry.  Neurological:     Mental Status: She is alert and oriented to person, place, and time.  Psychiatric:        Behavior: Behavior normal.      UC Treatments / Results  Labs (all labs ordered are listed, but only abnormal results are displayed) Labs Reviewed - No data to display  EKG   Radiology No results found.  Procedures Procedures (including critical care time)  Medications Ordered in UC Medications  methylPREDNISolone sodium succinate (SOLU-MEDROL) 40 mg/mL injection 80 mg (80 mg Intramuscular Given 02/22/20 1246)    Initial Impression / Assessment and Plan / UC Course  I have reviewed the triage vital signs and the nursing notes.  Pertinent labs & imaging results that were available during my care of the patient were reviewed by me and considered in my medical decision making (see chart for details).     Pt currently on last day of doxycycline. Vitals: WNL  Lungs: CTAB but dry cough noted on exam Will give dose of soulmedrol 80m IM, pt has done well with this in the past for cough Encouraged f/u with PCP, may contact our office next week if PCP won't see pt in person. Discussed symptoms that warrant emergent care in the  ED. AVS given  Final Clinical Impressions(s) / UC Diagnoses   Final diagnoses:  Upper respiratory tract infection, unspecified type  Acute bronchospasm  Cough     Discharge Instructions      Call to schedule a follow up appointment with your primary care provider next week for recheck of symptoms. Let them know you have been fully vaccinated with Moderna vaccine. Please call our office if your primary care provider continues to refuse in-person treatment for you.   Call 911 or have someone drive you to the hospital if symptoms significantly worsening.     ED Prescriptions    None     I have reviewed the PDMP during this encounter.   PNoe Gens [place]10/07/21 1353

## 2020-02-22 NOTE — Telephone Encounter (Signed)
Pt aware of appt 02/29/20

## 2020-02-22 NOTE — Discharge Instructions (Signed)
  Call to schedule a follow up appointment with your primary care provider next week for recheck of symptoms. Let them know you have been fully vaccinated with Moderna vaccine. Please call our office if your primary care provider continues to refuse in-person treatment for you.   Call 911 or have someone drive you to the hospital if symptoms significantly worsening.

## 2020-02-22 NOTE — ED Triage Notes (Signed)
Bronchitis was seen here on Friday, wheezing, dry cough now. Clear congestion, ears are hurting.

## 2020-02-23 ENCOUNTER — Telehealth: Payer: Self-pay | Admitting: *Deleted

## 2020-02-23 ENCOUNTER — Ambulatory Visit: Payer: Medicaid Other | Admitting: Physical Therapy

## 2020-02-23 ENCOUNTER — Telehealth: Payer: Self-pay

## 2020-02-23 ENCOUNTER — Other Ambulatory Visit: Payer: Self-pay

## 2020-02-23 ENCOUNTER — Encounter: Payer: Self-pay | Admitting: Physical Therapy

## 2020-02-23 DIAGNOSIS — M546 Pain in thoracic spine: Secondary | ICD-10-CM | POA: Diagnosis not present

## 2020-02-23 DIAGNOSIS — M6281 Muscle weakness (generalized): Secondary | ICD-10-CM

## 2020-02-23 DIAGNOSIS — M545 Low back pain, unspecified: Secondary | ICD-10-CM | POA: Diagnosis not present

## 2020-02-23 DIAGNOSIS — M542 Cervicalgia: Secondary | ICD-10-CM | POA: Diagnosis not present

## 2020-02-23 MED ORDER — JANUVIA 50 MG PO TABS
50.0000 mg | ORAL_TABLET | Freq: Every day | ORAL | 3 refills | Status: DC
Start: 2020-02-23 — End: 2020-03-05

## 2020-02-23 NOTE — Telephone Encounter (Signed)
Patient aware.

## 2020-02-23 NOTE — Telephone Encounter (Signed)
She is already on insulin, pioglitazone.  She is intolerant to Metformin.  See if we can resubmit this

## 2020-02-23 NOTE — Telephone Encounter (Signed)
  Prescription Request  02/23/2020  What is the name of the medication or equipment? Januvia  Have you contacted your pharmacy to request a refill? (if applicable) Yes  Which pharmacy would you like this sent to? [facility]  Pt needs Trulicity taken off her medicine list and needs refills on [facility] [place]. Says her pharmacy has tried contacting us about this several times.  Wants someone to call her about this ASAP.   Patient notified that their request is being sent to the clinical staff for review and that they should receive a response within 2 business days.

## 2020-02-23 NOTE — Telephone Encounter (Signed)
INSURANCE DENIED JANUVIA 15MG  TABLETS   PREFERRED MEDICATIONS  GlipiZIDE-MetFORMIN HCl  GlipiZIDE ER  MetFORMIN HCl ER  Pioglitazone HCl MetFORMIN HCl Glimepiride  GlipiZIDE  Onglyza Jardiance  Lantus SoloSta
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
## 2020-02-23 NOTE — Telephone Encounter (Signed)
This was completed this am

## 2020-02-25 DIAGNOSIS — Z881 Allergy status to other antibiotic agents status: Secondary | ICD-10-CM | POA: Diagnosis not present

## 2020-02-25 DIAGNOSIS — Z888 Allergy status to other drugs, medicaments and biological substances status: Secondary | ICD-10-CM | POA: Diagnosis not present

## 2020-02-25 DIAGNOSIS — Z885 Allergy status to narcotic agent status: Secondary | ICD-10-CM | POA: Diagnosis not present

## 2020-02-25 DIAGNOSIS — R071 Chest pain on breathing: Secondary | ICD-10-CM | POA: Diagnosis not present

## 2020-02-25 DIAGNOSIS — B974 Respiratory syncytial virus as the cause of diseases classified elsewhere: Secondary | ICD-10-CM | POA: Diagnosis not present

## 2020-02-25 DIAGNOSIS — Z882 Allergy status to sulfonamides status: Secondary | ICD-10-CM | POA: Diagnosis not present

## 2020-02-25 DIAGNOSIS — Z87891 Personal history of nicotine dependence: Secondary | ICD-10-CM | POA: Diagnosis not present

## 2020-02-25 DIAGNOSIS — F319 Bipolar disorder, unspecified: Secondary | ICD-10-CM | POA: Diagnosis not present

## 2020-02-25 DIAGNOSIS — S20212A Contusion of left front wall of thorax, initial encounter: Secondary | ICD-10-CM | POA: Diagnosis not present

## 2020-02-25 DIAGNOSIS — Z7952 Long term (current) use of systemic steroids: Secondary | ICD-10-CM | POA: Diagnosis not present

## 2020-02-25 DIAGNOSIS — Z79899 Other long term (current) drug therapy: Secondary | ICD-10-CM | POA: Diagnosis not present

## 2020-02-25 DIAGNOSIS — Z20822 Contact with and (suspected) exposure to covid-19: Secondary | ICD-10-CM | POA: Diagnosis not present

## 2020-02-25 DIAGNOSIS — Z7982 Long term (current) use of aspirin: Secondary | ICD-10-CM | POA: Diagnosis not present

## 2020-02-25 DIAGNOSIS — J45909 Unspecified asthma, uncomplicated: Secondary | ICD-10-CM | POA: Diagnosis not present

## 2020-02-25 DIAGNOSIS — Z7951 Long term (current) use of inhaled steroids: Secondary | ICD-10-CM | POA: Diagnosis not present

## 2020-02-25 DIAGNOSIS — K219 Gastro-esophageal reflux disease without esophagitis: Secondary | ICD-10-CM | POA: Diagnosis not present

## 2020-02-25 DIAGNOSIS — E119 Type 2 diabetes mellitus without complications: Secondary | ICD-10-CM | POA: Diagnosis not present

## 2020-02-25 DIAGNOSIS — E079 Disorder of thyroid, unspecified: Secondary | ICD-10-CM | POA: Diagnosis not present

## 2020-02-26 ENCOUNTER — Encounter: Payer: Medicaid Other | Admitting: Physical Therapy

## 2020-02-26 NOTE — Telephone Encounter (Signed)
   Insurance will pay for Onglyza (saxagliptin) I would switch RX to Onglyza 5mg  daily  No PA required  Same DPP4 class of medications  Consider SGLT2 if more control need in future  Thank you!

## 2020-02-27 ENCOUNTER — Encounter: Payer: Medicaid Other | Admitting: *Deleted

## 2020-02-28 ENCOUNTER — Encounter: Payer: Medicaid Other | Admitting: Physical Therapy

## 2020-02-29 ENCOUNTER — Ambulatory Visit: Payer: Medicaid Other | Admitting: Physical Therapy

## 2020-02-29 ENCOUNTER — Ambulatory Visit: Payer: Medicaid Other | Admitting: Family Medicine

## 2020-02-29 ENCOUNTER — Encounter: Payer: Self-pay | Admitting: Family Medicine

## 2020-02-29 ENCOUNTER — Ambulatory Visit (INDEPENDENT_AMBULATORY_CARE_PROVIDER_SITE_OTHER): Payer: Medicaid Other | Admitting: Family Medicine

## 2020-02-29 ENCOUNTER — Other Ambulatory Visit: Payer: Self-pay

## 2020-02-29 ENCOUNTER — Telehealth: Payer: Self-pay | Admitting: *Deleted

## 2020-02-29 VITALS — BP 135/86 | HR 90 | Temp 97.5°F | Ht 67.0 in | Wt 303.6 lb

## 2020-02-29 DIAGNOSIS — M542 Cervicalgia: Secondary | ICD-10-CM | POA: Diagnosis not present

## 2020-02-29 DIAGNOSIS — S2232XD Fracture of one rib, left side, subsequent encounter for fracture with routine healing: Secondary | ICD-10-CM

## 2020-02-29 DIAGNOSIS — E039 Hypothyroidism, unspecified: Secondary | ICD-10-CM

## 2020-02-29 DIAGNOSIS — E1165 Type 2 diabetes mellitus with hyperglycemia: Secondary | ICD-10-CM | POA: Diagnosis not present

## 2020-02-29 DIAGNOSIS — Z794 Long term (current) use of insulin: Secondary | ICD-10-CM

## 2020-02-29 DIAGNOSIS — M545 Low back pain, unspecified: Secondary | ICD-10-CM | POA: Diagnosis not present

## 2020-02-29 DIAGNOSIS — A419 Sepsis, unspecified organism: Secondary | ICD-10-CM

## 2020-02-29 DIAGNOSIS — R109 Unspecified abdominal pain: Secondary | ICD-10-CM

## 2020-02-29 DIAGNOSIS — J42 Unspecified chronic bronchitis: Secondary | ICD-10-CM

## 2020-02-29 DIAGNOSIS — G8929 Other chronic pain: Secondary | ICD-10-CM

## 2020-02-29 DIAGNOSIS — M546 Pain in thoracic spine: Secondary | ICD-10-CM | POA: Diagnosis not present

## 2020-02-29 DIAGNOSIS — M6281 Muscle weakness (generalized): Secondary | ICD-10-CM | POA: Diagnosis not present

## 2020-02-29 LAB — BAYER DCA HB A1C WAIVED: HB A1C (BAYER DCA - WAIVED): 7.5 % — ABNORMAL HIGH (ref <7.0)

## 2020-02-29 MED ORDER — LIDOCAINE 5 % EX PTCH: 1.0000 | MEDICATED_PATCH | CUTANEOUS | 0 refills | Status: DC

## 2020-02-29 NOTE — Telephone Encounter (Signed)
PA came in for Lidocaine 5% patches (Key: [id number]) on cover my meds  Cymbalta Lyrica Gabapentin Are preferred and 2 of these must be tried and failed before coverage is approved.  cymbalta and Lyrica has been used - found in care everywhere  Will attempt PA on [place] [address]   Sent to plan on cover my meds - not accepted on [place] tracks
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
## 2020-02-29 NOTE — Progress Notes (Signed)
Subjective: CC: ED follow-up for fractured ribs PCP: Janora Norlander, DO HPI:Judith [name] is a 49 y.o. female presenting to clinic today for:  1.  Rib fractures/chronic bronchitis Patient reports that she is staying rib fractures on the left side secondary to chronic and harsh coughing.  She has chronic bronchitis that is refractory to albuterol up to 4 times daily and scheduled Symbicort.  She notes that this has been an ongoing issue and has never seen a pulmonologist but would like to see 1.  She is also compliant with Singulair and OTC antihistamines.  The pain in the ribs is quite prominent and exacerbated by coughing.  She is holding her left breast in efforts to alleviate the pain.  She has pain medication prescribed.  She has not been applying any ice or heat because she was not sure which she could use.  She is a former "light smoker".  She is exposed to secondhand smoke by her fianc.  No known chemical exposures.   ROS: Per HPI  Allergies  Allergen Reactions  . Diphenhydramine Other (See Comments)    Hair feels like it is crawling  . Morphine Nausea And Vomiting    PCA PUMP N/V IV PUSH IN ER NO PROBLEM PER PT.  . Prednisone Nausea And Vomiting  . Adhesive [Tape]     Paper tape causes irritation   . Erythromycin Hives  . Metformin And Related     Diarrhea with IR and XR products  . Sulfonamide Derivatives Other (See Comments)    Does not take due to family history   Past Medical History:  Diagnosis Date  . Asthma   . Bipolar 1 disorder ([place])   . Depression   . GERD (gastroesophageal reflux disease)   . Hyperlipemia   . Hypothyroid   . Migraines     Current Outpatient Medications:  .  Accu-Chek FastClix Lancets MISC, Test blood sugars daily Dx E11.9, Disp: 102 each, Rfl: 3 .  albuterol (PROVENTIL) (2.5 MG/3ML) 0.083% nebulizer solution, Nebulize 1 vial three times daily, Disp: 150 mL, Rfl: 2 .  albuterol (PROVENTIL) (2.5 MG/3ML) 0.083% nebulizer solution,  Take 3 mLs (2.5 mg total) by nebulization every 6 (six) hours as needed for wheezing or shortness of breath., Disp: 75 mL, Rfl: 12 .  albuterol (VENTOLIN HFA) 108 (90 Base) MCG/ACT inhaler, Inhale 2 puffs into the lungs every 6 (six) hours as needed for wheezing or shortness of breath., Disp: 8 g, Rfl: 1 .  atorvastatin (LIPITOR) 10 MG tablet, Take 1 tablet (10 mg total) by mouth daily., Disp: 90 tablet, Rfl: 3 .  blood glucose meter kit and supplies, Dispense based on patient and insurance preference. Check blood sugar bid and as needed. Dx: E11.9, Disp: 1 each, Rfl: 0 .  Continuous Blood Gluc Receiver (FREESTYLE LIBRE 2 READER) DEVI, USE TO SCAN BLOOD SUGAR AS DIRECTED UP TO 6 TIMES DAILY. DX: E11.9, Disp: 2 each, Rfl: 1 .  Continuous Blood Gluc Sensor (FREESTYLE LIBRE 2 SENSOR) MISC, Use to check blood sugar as directed up to 6 times daily. DX: E11.9. 2 sensors=30 day supply, 6 sensors=90 day supply, Disp: 6 each, Rfl: 4 .  cyclobenzaprine (FLEXERIL) 10 MG tablet, Take 1 tablet (10 mg total) by mouth 3 (three) times daily as needed for muscle spasms., Disp: 60 tablet, Rfl: 5 .  diclofenac Sodium (VOLTAREN) 1 % GEL, Apply 4 g topically 4 (four) times daily. For knee pain, Disp: 400 g, Rfl: 2 .  dicyclomine (BENTYL) 20 MG tablet, Take 1 tablet (20 mg total) by mouth every 6 (six) hours., Disp: 60 tablet, Rfl: 5 .  glucose blood (ACCU-CHEK GUIDE) test strip, Use as instructed E11.9, Disp: 100 strip, Rfl: 12 .  HYDROcodone-acetaminophen (NORCO/VICODIN) 5-325 MG tablet, Take 1 tablet by mouth every 8 (eight) hours as needed., Disp: 15 tablet, Rfl: 0 .  HYDROcodone-homatropine (HYCODAN) 5-1.5 MG/5ML syrup, Take 5 mLs by mouth at bedtime as needed for cough., Disp: 100 mL, Rfl: 0 .  hydrOXYzine (ATARAX/VISTARIL) 25 MG tablet, Take 1 tablet (25 mg total) by mouth 3 (three) times daily., Disp: 90 tablet, Rfl: 5 .  ibuprofen (ADVIL) 600 MG tablet, Take 1 tablet (600 mg total) by mouth every 8 (eight) hours as  needed., Disp: 30 tablet, Rfl: 1 .  insulin glargine (LANTUS SOLOSTAR) 100 UNIT/ML Solostar Pen, Inject 20-50 Units into the skin daily., Disp: 15 mL, Rfl: 1 .  Insulin Pen Needle 31G X 5 MM MISC, 1 Units by Does not apply route daily., Disp: 100 each, Rfl: 5 .  JANUVIA 50 MG tablet, Take 1 tablet (50 mg total) by mouth daily., Disp: 90 tablet, Rfl: 3 .  lamoTRIgine (LAMICTAL) 150 MG tablet, Take 1 tablet (150 mg total) by mouth 2 (two) times daily., Disp: 60 tablet, Rfl: 2 .  levothyroxine (SYNTHROID) 125 MCG tablet, Take 1 tablet (125 mcg total) by mouth daily., Disp: 90 tablet, Rfl: 1 .  lisdexamfetamine (VYVANSE) 70 MG capsule, Take 1 capsule (70 mg total) by mouth daily., Disp: 30 capsule, Rfl: 0 .  lisinopril (ZESTRIL) 20 MG tablet, Take 1 tablet (20 mg total) by mouth daily., Disp: 90 tablet, Rfl: 3 .  metoCLOPramide (REGLAN) 5 MG tablet, TAKE 1 TABLET (5 MG TOTAL) BY MOUTH EVERY 8 (EIGHT) HOURS AS NEEDED FOR NAUSEA., Disp: 20 tablet, Rfl: 0 .  montelukast (SINGULAIR) 10 MG tablet, TAKE ONE TAB BY MOUTH AT BEDTIME, Disp: 90 tablet, Rfl: 3 .  nystatin cream (MYCOSTATIN), Apply 1 application topically 2 (two) times daily. x2 weeks, Disp: 60 g, Rfl: 0 .  pantoprazole (PROTONIX) 40 MG tablet, TAKE ONE TAB BY MOUTH 2 TIMES DAILY, Disp: 180 tablet, Rfl: 3 .  pioglitazone (ACTOS) 30 MG tablet, TAKE 1 TABLET BY MOUTH EVERY DAY, Disp: 90 tablet, Rfl: 0 .  promethazine-dextromethorphan (PROMETHAZINE-DM) 6.25-15 MG/5ML syrup, Take by mouth 4 (four) times daily as needed for cough., Disp: , Rfl:  .  sertraline (ZOLOFT) 100 MG tablet, Take 2 tablets (200 mg total) by mouth daily., Disp: 180 tablet, Rfl: 3 .  SYMBICORT 160-4.5 MCG/ACT inhaler, TAKE 2 PUFFS BY MOUTH TWICE A DAY, Disp: 10.2 g, Rfl: 0 .  traMADol (ULTRAM) 50 MG tablet, Take 1 tablet (50 mg total) by mouth every 8 (eight) hours as needed for severe pain., Disp: 90 tablet, Rfl: 5 .  meloxicam (MOBIC) 7.5 MG tablet, Take 1 tablet (7.5 mg total)  by mouth daily. (Patient not taking: Reported on 02/29/2020), Disp: 30 tablet, Rfl: 0 Social History   Socioeconomic History  . Marital status: Divorced    Spouse name: Not on file  . Number of children: Not on file  . Years of education: Not on file  . Highest education level: Not on file  Occupational History  . Not on file  Tobacco Use  . Smoking status: Former Research scientist (life sciences)  . Smokeless tobacco: Never Used  Vaping Use  . Vaping Use: Former  Substance and Sexual Activity  . Alcohol use: No  . Drug  use: No  . Sexual activity: Yes    Birth control/protection: I.U.D.  Other Topics Concern  . Not on file  Social History Narrative  . Not on file   Social Determinants of Health   Financial Resource Strain:   . Difficulty of Paying Living Expenses: Not on file  Food Insecurity:   . Worried About Charity fundraiser in the Last Year: Not on file  . Ran Out of Food in the Last Year: Not on file  Transportation Needs:   . Lack of Transportation (Medical): Not on file  . Lack of Transportation (Non-Medical): Not on file  Physical Activity:   . Days of Exercise per Week: Not on file  . Minutes of Exercise per Session: Not on file  Stress:   . Feeling of Stress : Not on file  Social Connections:   . Frequency of Communication with Friends and Family: Not on file  . Frequency of Social Gatherings with Friends and Family: Not on file  . Attends Religious Services: Not on file  . Active Member of Clubs or Organizations: Not on file  . Attends Archivist Meetings: Not on file  . Marital Status: Not on file  Intimate Partner Violence:   . Fear of Current or Ex-Partner: Not on file  . Emotionally Abused: Not on file  . Physically Abused: Not on file  . Sexually Abused: Not on file   Family History  Problem Relation Age of Onset  . Mental illness Other   . Diabetes Other   . Healthy Mother   . Colon cancer Neg Hx   . Stomach cancer Neg Hx   . Pancreatic cancer Neg Hx      Objective: Office vital signs reviewed. BP 135/86   Pulse 90   Temp (!) 97.5 F (36.4 C)   Ht $R'5\' 7"'DJ$  (1.702 m)   Wt (!) 303 lb 9.6 oz (137.7 kg)   SpO2 94%   BMI 47.55 kg/m   Physical Examination:  General: Awake, alert, morbidly obese, No acute distress HEENT: Normal, sclera white, MMM Cardio: regular rate and rhythm, S1S2 heard, no murmurs appreciated Pulm: clear to auscultation bilaterally, no wheezes, rhonchi or rales; normal work of breathing on room air Extremities: warm, well perfused, No edema, cyanosis or clubbing; +2 pulses bilaterally MSK: antalgic, wide based gait; Left ribs are tender to palpation just under the left breast.  Assessment/ Plan: 50 y.o. female  Closed fracture of one rib of left side with routine healing, subsequent encounter - Plan: lidocaine (LIDODERM) 5 %  Chronic bronchitis, unspecified chronic bronchitis type ([place]) - Plan: Ambulatory referral to Pulmonology  Type 2 diabetes mellitus with hyperglycemia, with long-term current use of insulin ([place]) - Plan: CMP14+EGFR, Lipid panel, Bayer DCA Hb A1c Waived  Hypothyroidism, unspecified type - Plan: Thyroid Panel With TSH  Chronic abdominal pain  For her rib fracture I have prescribed lidocaine patches.  We discussed ways to avoid exacerbating the pain.  We discussed typical healing course.  She will continue her other prescription medications as prescribed.  She understands red flag signs and symptoms.  With regards to her chronic bronchitis which unfortunately is likely exacerbating the above, I have placed a referral to pulmonology for further evaluation.  She is a former light smoker and continues to be exposed to tobacco smoke by her fianc.  She had no known chemical exposures.  Symptoms are refractory now to Symbicort and albuterol (which she is using fairly regularly at this  point).  Would benefit from formal pulmonary function testing  She is asymptomatic from a hypothyroidism  standpoint.  She has follow-up with GI on 5 November and they plan to do an EGD and colonoscopy at that point.  I have extended her note for work out until then.   No orders of the defined types were placed in this encounter.  No orders of the defined types were placed in this encounter.    Janora Norlander, DO [place] [phone]

## 2020-02-29 NOTE — Patient Instructions (Signed)

## 2020-03-01 ENCOUNTER — Ambulatory Visit: Payer: Medicaid Other | Admitting: Physical Therapy

## 2020-03-01 ENCOUNTER — Telehealth: Payer: Self-pay | Admitting: Family Medicine

## 2020-03-01 LAB — LIPID PANEL
Chol/HDL Ratio: 6.7 ratio — ABNORMAL HIGH (ref 0.0–4.4)
Cholesterol, Total: 209 mg/dL — ABNORMAL HIGH (ref 100–199)
HDL: 31 mg/dL — ABNORMAL LOW (ref >39)
LDL Chol Calc (NIH): 141 mg/dL — ABNORMAL HIGH (ref 0–99)
Triglycerides: 201 mg/dL — ABNORMAL HIGH (ref 0–149)
VLDL Cholesterol Cal: 37 mg/dL (ref 5–40)

## 2020-03-01 LAB — CMP14+EGFR
ALT: 12 IU/L (ref 0–32)
AST: 9 IU/L (ref 0–40)
Albumin/Globulin Ratio: 1.5 (ref 1.2–2.2)
Albumin: 4.2 g/dL (ref 3.8–4.8)
Alkaline Phosphatase: 106 IU/L (ref 44–121)
BUN/Creatinine Ratio: 22 (ref 9–23)
BUN: 17 mg/dL (ref 6–24)
Bilirubin Total: 0.3 mg/dL (ref 0.0–1.2)
CO2: 28 mmol/L (ref 20–29)
Calcium: 9.2 mg/dL (ref 8.7–10.2)
Chloride: 98 mmol/L (ref 96–106)
Creatinine, Ser: 0.78 mg/dL (ref 0.57–1.00)
GFR calc Af Amer: 103 mL/min/{1.73_m2} (ref >59)
GFR calc non Af Amer: 89 mL/min/{1.73_m2} (ref >59)
Globulin, Total: 2.8 g/dL (ref 1.5–4.5)
Glucose: 167 mg/dL — ABNORMAL HIGH (ref 65–99)
Potassium: 4.5 mmol/L (ref 3.5–5.2)
Sodium: 139 mmol/L (ref 134–144)
Total Protein: 7 g/dL (ref 6.0–8.5)

## 2020-03-01 LAB — THYROID PANEL WITH TSH
Free Thyroxine Index: 2.5 (ref 1.2–4.9)
T3 Uptake Ratio: 25 % (ref 24–39)
T4, Total: 9.8 ug/dL (ref 4.5–12.0)
TSH: 3.65 u[IU]/mL (ref 0.450–4.500)

## 2020-03-01 NOTE — Telephone Encounter (Signed)
Please send in a refill of current dose levothyroxine. Of note, I looked at her chart, Judith [name] sent in a 32m supply in May. She technically still should have another month left.

## 2020-03-01 NOTE — Telephone Encounter (Signed)
Lmtcb.

## 2020-03-01 NOTE — Telephone Encounter (Signed)
Please review pt lab results asap to determine what dosage of meds are going to be called in for her. Pt said she took her last levothyroxine last night

## 2020-03-04 ENCOUNTER — Encounter: Payer: Medicaid Other | Admitting: Physical Therapy

## 2020-03-05 ENCOUNTER — Ambulatory Visit: Payer: Medicaid Other | Admitting: Pharmacist

## 2020-03-05 ENCOUNTER — Ambulatory Visit: Payer: Medicaid Other | Admitting: *Deleted

## 2020-03-05 ENCOUNTER — Other Ambulatory Visit: Payer: Self-pay

## 2020-03-05 ENCOUNTER — Other Ambulatory Visit: Payer: Self-pay | Admitting: Family Medicine

## 2020-03-05 DIAGNOSIS — E119 Type 2 diabetes mellitus without complications: Secondary | ICD-10-CM | POA: Diagnosis not present

## 2020-03-05 DIAGNOSIS — Z794 Long term (current) use of insulin: Secondary | ICD-10-CM | POA: Diagnosis not present

## 2020-03-05 DIAGNOSIS — M545 Low back pain, unspecified: Secondary | ICD-10-CM

## 2020-03-05 DIAGNOSIS — M542 Cervicalgia: Secondary | ICD-10-CM | POA: Diagnosis not present

## 2020-03-05 DIAGNOSIS — E118 Type 2 diabetes mellitus with unspecified complications: Secondary | ICD-10-CM

## 2020-03-05 DIAGNOSIS — M6281 Muscle weakness (generalized): Secondary | ICD-10-CM | POA: Diagnosis not present

## 2020-03-05 DIAGNOSIS — J4521 Mild intermittent asthma with (acute) exacerbation: Secondary | ICD-10-CM | POA: Diagnosis not present

## 2020-03-05 DIAGNOSIS — E039 Hypothyroidism, unspecified: Secondary | ICD-10-CM

## 2020-03-05 DIAGNOSIS — M546 Pain in thoracic spine: Secondary | ICD-10-CM | POA: Diagnosis not present

## 2020-03-05 MED ORDER — ALBUTEROL SULFATE HFA 108 (90 BASE) MCG/ACT IN AERS: 2.0000 | INHALATION_SPRAY | Freq: Four times a day (QID) | RESPIRATORY_TRACT | 1 refills | Status: DC | PRN

## 2020-03-05 MED ORDER — PIOGLITAZONE HCL 30 MG PO TABS: 30.0000 mg | ORAL_TABLET | Freq: Every day | ORAL | 0 refills | Status: DC

## 2020-03-05 MED ORDER — LEVOTHYROXINE SODIUM 125 MCG PO TABS: 125.0000 ug | ORAL_TABLET | Freq: Every day | ORAL | 1 refills | Status: DC

## 2020-03-05 MED ORDER — SAXAGLIPTIN HCL 5 MG PO TABS: 5.0000 mg | ORAL_TABLET | Freq: Every day | ORAL | 3 refills | Status: DC

## 2020-03-05 NOTE — Addendum Note (Signed)
Addended by: Lottie Dawson D on: 03/05/2020 03:36 PM   Modules accepted: Orders
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
## 2020-03-06 ENCOUNTER — Telehealth: Payer: Self-pay

## 2020-03-06 ENCOUNTER — Telehealth: Payer: Self-pay | Admitting: *Deleted

## 2020-03-06 ENCOUNTER — Encounter: Payer: Medicaid Other | Admitting: Physical Therapy

## 2020-03-06 NOTE — Telephone Encounter (Signed)
PA resent through [employer]  Confirmation: [id number] W

## 2020-03-06 NOTE — Telephone Encounter (Signed)
Januvia 50 is not covered by Medicaid   Must try and fail at least 2 of the preferred meds 1st.  Preferred meds are:  GlipiZIDE-MetFORMIN HCl     GlipiZIDE ER    MetFORMIN HCl ER    Pioglitazone HCl MetFORMIN HCl Glimepiride       GlipiZIDE          Onglyza  Onglyza was sent yesterday  Will close encounter and notify pharm to d/c [place]

## 2020-03-06 NOTE — Telephone Encounter (Signed)
PA sent for Onglyza 5mg  tablets.    Your PA has been faxed to the plan as a paper copy.   Please contact the plan directly if you haven't received a determination in a typical timeframe.  You will be notified of the determination via fax. How do I know if the plan approved the PA?  Add Reminder to your Dashboard Remind me in:  5 business days  Contact plan to follow up on [place] / Print PA

## 2020-03-07 ENCOUNTER — Encounter: Payer: Medicaid Other | Admitting: Physical Therapy

## 2020-03-07 NOTE — Telephone Encounter (Signed)
Lidocaine patched denied.

## 2020-03-08 ENCOUNTER — Ambulatory Visit: Payer: Medicaid Other | Admitting: Pharmacist

## 2020-03-08 ENCOUNTER — Telehealth: Payer: Self-pay

## 2020-03-08 NOTE — Telephone Encounter (Signed)
Pt called requesting to speak with Tye Maryland about some paperwork she needs to be filled out. Says Tye Maryland knows all about it.

## 2020-03-11 ENCOUNTER — Encounter: Payer: Medicaid Other | Admitting: Physical Therapy

## 2020-03-11 NOTE — Telephone Encounter (Signed)
PA resubmitted under [employer]  Confirmation#: [id number] W

## 2020-03-12 ENCOUNTER — Telehealth: Payer: Self-pay

## 2020-03-12 ENCOUNTER — Other Ambulatory Visit: Payer: Self-pay

## 2020-03-12 ENCOUNTER — Ambulatory Visit: Payer: Medicaid Other | Admitting: Physical Therapy

## 2020-03-12 DIAGNOSIS — M545 Low back pain, unspecified: Secondary | ICD-10-CM | POA: Diagnosis not present

## 2020-03-12 DIAGNOSIS — M542 Cervicalgia: Secondary | ICD-10-CM

## 2020-03-12 DIAGNOSIS — M546 Pain in thoracic spine: Secondary | ICD-10-CM | POA: Diagnosis not present

## 2020-03-12 DIAGNOSIS — M6281 Muscle weakness (generalized): Secondary | ICD-10-CM
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
## 2020-03-12 NOTE — Telephone Encounter (Signed)
Refills of levothyroxine sent to pharmacy.  Patient to call back with any further questions or concerns

## 2020-03-12 NOTE — Telephone Encounter (Signed)
Ok to see me before then.  I think we need to extend her note after her GI appt.  That was the plan.

## 2020-03-12 NOTE — Telephone Encounter (Signed)
TC to patient, lost connection. Received records release & RTW questions, placed on providers desk

## 2020-03-13 ENCOUNTER — Encounter: Payer: Medicaid Other | Admitting: Physical Therapy

## 2020-03-13 NOTE — Telephone Encounter (Signed)
Appointment scheduled.

## 2020-03-13 NOTE — Telephone Encounter (Signed)
Pt wanting update on FMLA / STD ppw, this is on Dr. Lajuana Ripple desk

## 2020-03-14 ENCOUNTER — Other Ambulatory Visit: Payer: Self-pay

## 2020-03-14 ENCOUNTER — Telehealth: Payer: Self-pay | Admitting: Family Medicine

## 2020-03-14 ENCOUNTER — Telehealth: Payer: Self-pay

## 2020-03-14 ENCOUNTER — Ambulatory Visit: Payer: Medicaid Other | Admitting: Physical Therapy

## 2020-03-14 DIAGNOSIS — M546 Pain in thoracic spine: Secondary | ICD-10-CM | POA: Diagnosis not present

## 2020-03-14 DIAGNOSIS — M545 Low back pain, unspecified: Secondary | ICD-10-CM

## 2020-03-14 DIAGNOSIS — M542 Cervicalgia: Secondary | ICD-10-CM | POA: Diagnosis not present

## 2020-03-14 DIAGNOSIS — M6281 Muscle weakness (generalized): Secondary | ICD-10-CM | POA: Diagnosis not present
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
## 2020-03-14 NOTE — Telephone Encounter (Signed)
Called back.   Rosemarie Ax, MD [facility] 03/14/2020, 4:08 PM

## 2020-03-14 NOTE — Telephone Encounter (Signed)
Judith [name] cld from Dr. Barnetta Hammersmith office( disability determination review input from Dr. Raeford Razor sought for Case # [id number] for Judith [name] DOB [birth year] Judith [name] MRN [id number])  Forwarding message to provider to address.by calling [phone]  --glh

## 2020-03-14 NOTE — Telephone Encounter (Signed)
For Cathy--add Event number [id number] ADA to cover letter when [employer] ppw faxed. Please send an updated work note to [employer]. Pt said she forgot to tell cathy this yesterday.

## 2020-03-15 ENCOUNTER — Ambulatory Visit (INDEPENDENT_AMBULATORY_CARE_PROVIDER_SITE_OTHER): Payer: Medicaid Other | Admitting: Family Medicine

## 2020-03-15 ENCOUNTER — Telehealth: Payer: Self-pay | Admitting: Family Medicine

## 2020-03-15 ENCOUNTER — Encounter: Payer: Self-pay | Admitting: Family Medicine

## 2020-03-15 VITALS — BP 136/83 | HR 101 | Ht 68.0 in | Wt 303.6 lb

## 2020-03-15 DIAGNOSIS — M542 Cervicalgia: Secondary | ICD-10-CM | POA: Diagnosis not present

## 2020-03-15 DIAGNOSIS — M546 Pain in thoracic spine: Secondary | ICD-10-CM | POA: Diagnosis not present

## 2020-03-15 DIAGNOSIS — S2232XA Fracture of one rib, left side, initial encounter for closed fracture: Secondary | ICD-10-CM

## 2020-03-15 DIAGNOSIS — M545 Low back pain, unspecified: Secondary | ICD-10-CM

## 2020-03-15 DIAGNOSIS — S060X0D Concussion without loss of consciousness, subsequent encounter: Secondary | ICD-10-CM

## 2020-03-15 DIAGNOSIS — H9311 Tinnitus, right ear: Secondary | ICD-10-CM | POA: Diagnosis not present

## 2020-03-15 HISTORY — DX: Fracture of one rib, left side, initial encounter for closed fracture: S22.32XA

## 2020-03-15 NOTE — Assessment & Plan Note (Signed)
Having ongoing pain and having some headaches.  Could be associated with the neck pain. -Counseled on home exercise therapy and supportive care. -Continue physical therapy.

## 2020-03-15 NOTE — Assessment & Plan Note (Signed)
Symptoms do seem to be improving.  Having tinnitus that may be related to the concussion.  Headache is ongoing.  May be related to neck pain as well. -Counseled on home exercise therapy and supportive care. -Referral to physical therapy. -Follow-up 4 to 6 weeks.
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
## 2020-03-15 NOTE — Patient Instructions (Signed)
Good to see you Please continue physical therapy. Please let me know if they need another order.   Please try heat on the neck and back  Please try pennsaid on the knees   Please send me a message in MyChart with any questions or updates.  Please see me back in 4-6 weeks.   --Dr. Raeford Razor

## 2020-03-15 NOTE — Assessment & Plan Note (Signed)
Some improvement of her pain but it was exacerbated with repetitive coughing. -Counseled on home exercise therapy and supportive care. -Continue physical therapy.
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
## 2020-03-15 NOTE — Assessment & Plan Note (Signed)
Had this injection with most recent x-ray from her repetitive coughing. -Pennsaid samples. -Rib belt.

## 2020-03-15 NOTE — Progress Notes (Signed)
Medication Samples have been provided to the patient.  Drug name: Pennsaid       Strength: 2%        Qty: 2 Boxes  LOT: [id number]  Exp.Date: 07/2020  Dosing instructions: Use a pea size amount and rub gently.  The patient has been instructed regarding the correct time, dose, and frequency of taking this medication, including desired effects and most common side effects.   Sherrie George, [place] 10:24 AM 03/15/2020

## 2020-03-15 NOTE — Assessment & Plan Note (Signed)
Pain has been slowly improving. -Continue physical therapy. -Counseled on home exercise therapy and supportive care.
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
## 2020-03-18 ENCOUNTER — Telehealth: Payer: Self-pay | Admitting: Gastroenterology

## 2020-03-18 ENCOUNTER — Telehealth: Payer: Self-pay | Admitting: Family Medicine

## 2020-03-18 ENCOUNTER — Encounter: Payer: Medicaid Other | Admitting: Physical Therapy

## 2020-03-18 NOTE — Telephone Encounter (Signed)
Provided work note.   Rosemarie Ax, MD [facility] 03/18/2020, 8:50 AM

## 2020-03-18 NOTE — Telephone Encounter (Signed)
Pt Is requesting a call back from a nurse to discuss a peer to peer phone conversation for disability. Pt would like to know how the provider would like to proceed regarding her case.  Case# [id number] [phone]

## 2020-03-18 NOTE — Telephone Encounter (Signed)
Wants to have a dr to dr conversation about the pt disability

## 2020-03-18 NOTE — Telephone Encounter (Signed)
Vicky @ Dr.David Rushum (an independent provider ) conducting Disability review on Nazarene Regis's Case no# [id number]  --Request Dr. Raeford Razor call them @ [phone] to conduct a Peer to Peer .  --Forwarding message.  --glh

## 2020-03-19 ENCOUNTER — Other Ambulatory Visit: Payer: Self-pay

## 2020-03-19 ENCOUNTER — Ambulatory Visit: Payer: Medicaid Other | Attending: Family Medicine | Admitting: Physical Therapy

## 2020-03-19 DIAGNOSIS — M542 Cervicalgia: Secondary | ICD-10-CM | POA: Insufficient documentation

## 2020-03-19 DIAGNOSIS — M545 Low back pain, unspecified: Secondary | ICD-10-CM

## 2020-03-19 DIAGNOSIS — M546 Pain in thoracic spine: Secondary | ICD-10-CM | POA: Diagnosis not present

## 2020-03-19 DIAGNOSIS — M6281 Muscle weakness (generalized): Secondary | ICD-10-CM

## 2020-03-19 NOTE — Telephone Encounter (Signed)
petra -
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
## 2020-03-19 NOTE — Telephone Encounter (Signed)
I contacted patient today to get permission to speak to caller.  Patient did not know who that was and did not give me permission to disclose information until she speaks to them.

## 2020-03-19 NOTE — Telephone Encounter (Signed)
Returned call to patient she is unsure on who reached out but it was not her.  Called CB number that is listed, spoke with Drucie Ip and she is calling [employer] which is a [employer], she advised that Jocelyn Lamer will give me a call back when she is available.

## 2020-03-20 ENCOUNTER — Encounter: Payer: Medicaid Other | Admitting: Physical Therapy

## 2020-03-20 NOTE — Telephone Encounter (Signed)
Attempted to call multiple times with no answer.   Rosemarie Ax, MD [facility] 03/20/2020, 8:24 AM

## 2020-03-22 ENCOUNTER — Encounter: Payer: Self-pay | Admitting: Physician Assistant

## 2020-03-22 ENCOUNTER — Ambulatory Visit: Payer: Medicaid Other | Admitting: Physician Assistant

## 2020-03-22 ENCOUNTER — Other Ambulatory Visit: Payer: Self-pay

## 2020-03-22 ENCOUNTER — Ambulatory Visit: Payer: Medicaid Other | Admitting: *Deleted

## 2020-03-22 VITALS — BP 120/82 | HR 105 | Ht 68.0 in | Wt 309.8 lb

## 2020-03-22 DIAGNOSIS — M545 Low back pain, unspecified: Secondary | ICD-10-CM

## 2020-03-22 DIAGNOSIS — R110 Nausea: Secondary | ICD-10-CM

## 2020-03-22 DIAGNOSIS — K5900 Constipation, unspecified: Secondary | ICD-10-CM

## 2020-03-22 DIAGNOSIS — K219 Gastro-esophageal reflux disease without esophagitis: Secondary | ICD-10-CM

## 2020-03-22 DIAGNOSIS — M542 Cervicalgia: Secondary | ICD-10-CM

## 2020-03-22 DIAGNOSIS — M6281 Muscle weakness (generalized): Secondary | ICD-10-CM | POA: Diagnosis not present

## 2020-03-22 DIAGNOSIS — M546 Pain in thoracic spine: Secondary | ICD-10-CM | POA: Diagnosis not present

## 2020-03-22 MED ORDER — DEXLANSOPRAZOLE 60 MG PO CPDR: 60.0000 mg | DELAYED_RELEASE_CAPSULE | Freq: Every day | ORAL | 3 refills | Status: DC

## 2020-03-22 MED ORDER — LINACLOTIDE 72 MCG PO CAPS: 72.0000 ug | ORAL_CAPSULE | Freq: Every day | ORAL | 2 refills | Status: DC

## 2020-03-22 NOTE — Patient Instructions (Signed)
If you are age 50 or older, your body mass index should be between 23-30. Your Body mass index is 47.11 kg/m. If this is out of the aforementioned range listed, please consider follow up with your Primary Care Provider.  If you are age 65 or younger, your body mass index should be between 19-25. Your Body mass index is 47.11 kg/m. If this is out of the aformentioned range listed, please consider follow up with your Primary Care Provider.   Hold Reglan.   We have sent the following medications to your pharmacy for you to pick up at your convenience: Dexilant 60 mg daily.  Linzess 72 mcg daily 30 minutes before a meal.   You have been scheduled for a gastric emptying scan at [facility] on Monday 04/01/20 at 10:30 am. Please arrive at least 15 minutes prior to your appointment for registration. Please make certain not to have anything to eat or drink after midnight the night before your test. Hold all stomach medications (ex: Zofran, phenergan, Reglan) 48 hours prior to your test. If you need to reschedule your appointment, please contact radiology scheduling at [phone]. _______________________________________________________________ A gastric-emptying study measures how long it takes for food to move through your stomach. There are several ways to measure stomach emptying. In the most common test, you eat food that contains a small amount of radioactive material. A scanner that detects the movement of the radioactive material is placed over your abdomen to monitor the rate at which food leaves your stomach. This test normally takes about 4 hours to complete. ______________________________________________________________

## 2020-03-22 NOTE — Progress Notes (Signed)
Agree with assessment and plan as outlined.  
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
## 2020-03-26 ENCOUNTER — Encounter: Payer: Self-pay | Admitting: Family Medicine

## 2020-03-26 ENCOUNTER — Encounter: Payer: Self-pay | Admitting: Physical Therapy

## 2020-03-26 ENCOUNTER — Ambulatory Visit (INDEPENDENT_AMBULATORY_CARE_PROVIDER_SITE_OTHER): Payer: Medicaid Other | Admitting: Family Medicine

## 2020-03-26 ENCOUNTER — Ambulatory Visit: Payer: Medicaid Other | Admitting: Physical Therapy

## 2020-03-26 ENCOUNTER — Other Ambulatory Visit: Payer: Self-pay

## 2020-03-26 VITALS — BP 116/78 | HR 97 | Temp 96.7°F | Ht 68.0 in | Wt 309.6 lb

## 2020-03-26 DIAGNOSIS — M545 Low back pain, unspecified: Secondary | ICD-10-CM

## 2020-03-26 DIAGNOSIS — J42 Unspecified chronic bronchitis: Secondary | ICD-10-CM | POA: Diagnosis not present

## 2020-03-26 DIAGNOSIS — M542 Cervicalgia: Secondary | ICD-10-CM | POA: Diagnosis not present

## 2020-03-26 DIAGNOSIS — S060X0D Concussion without loss of consciousness, subsequent encounter: Secondary | ICD-10-CM | POA: Diagnosis not present

## 2020-03-26 DIAGNOSIS — M5136 Other intervertebral disc degeneration, lumbar region: Secondary | ICD-10-CM

## 2020-03-26 DIAGNOSIS — G8929 Other chronic pain: Secondary | ICD-10-CM

## 2020-03-26 DIAGNOSIS — M6281 Muscle weakness (generalized): Secondary | ICD-10-CM

## 2020-03-26 DIAGNOSIS — R109 Unspecified abdominal pain: Secondary | ICD-10-CM | POA: Diagnosis not present

## 2020-03-26 DIAGNOSIS — S2232XD Fracture of one rib, left side, subsequent encounter for fracture with routine healing: Secondary | ICD-10-CM

## 2020-03-26 DIAGNOSIS — M1711 Unilateral primary osteoarthritis, right knee: Secondary | ICD-10-CM

## 2020-03-26 DIAGNOSIS — M51369 Other intervertebral disc degeneration, lumbar region without mention of lumbar back pain or lower extremity pain: Secondary | ICD-10-CM

## 2020-03-26 DIAGNOSIS — M546 Pain in thoracic spine: Secondary | ICD-10-CM

## 2020-03-26 MED ORDER — PROMETHAZINE-DM 6.25-15 MG/5ML PO SYRP: 2.5000 mL | ORAL_SOLUTION | Freq: Four times a day (QID) | ORAL | 0 refills | Status: DC | PRN

## 2020-03-26 MED ORDER — TRAMADOL HCL 50 MG PO TABS
50.0000 mg | ORAL_TABLET | Freq: Three times a day (TID) | ORAL | 5 refills | Status: DC | PRN
Start: 2020-03-30 — End: 2020-09-26

## 2020-03-26 MED ORDER — IBUPROFEN 600 MG PO TABS
600.0000 mg | ORAL_TABLET | Freq: Three times a day (TID) | ORAL | 1 refills | Status: DC | PRN
Start: 2020-03-26 — End: 2020-06-19

## 2020-03-26 NOTE — Progress Notes (Signed)
Subjective: CC: Follow-up abdominal pain, musculoskeletal pain PCP: Janora Norlander, DO HPI:Judith [name] is a 49 y.o. female presenting to clinic today for:  1.  Abdominal pain Patient was recently evaluated again by gastroenterology on 03/22/2020.  Her medications have been changed.  They have discontinued the Reglan for now as they would like to pursue a gastric emptying study.  There is concern for gastroparesis.  Additionally, Linzess was added for chronic constipation and Dexilant also added for uncontrolled acid reflux.  She reports intermittent nausea.  No vomiting.  No hematochezia or melena.  She continues to have intermittent abdominal pain that is severe.  She has bowel movements every couple of days.  2.  Musculoskeletal pain, concussion Patient was seen by sports medicine on March 15, 2020.  She has follow-up visit with them in December.  She was referred to physical therapy and she is supposed to be seeing an ear nose and throat doctor for her ongoing headache and tinnitus bilaterally.  She reports intermittent dizziness and feeling of instability. Her closed fracture of the left rib was treated with Pennsaid samples and a rib belt. Her acute thoracic back pain again was referred to physical therapy and ongoing supportive care and control of cough was recommended  She reports ongoing back, neck and rib pain.  She is unable to sit, stand for prolonged amounts of time.  She is unable to lift or carry out her duties at [employer] due to inability to climb ladder.  She reports weight gain.  3.  Chronic bronchitis Patient has an appointment to establish care with pulmonology on November 18.  She reports breathing is stable.  She is compliant with Symbicort    ROS: Per HPI  Allergies  Allergen Reactions  . Diphenhydramine Other (See Comments)    Hair feels like it is crawling  . Morphine Nausea And Vomiting    PCA PUMP N/V IV PUSH IN ER NO PROBLEM PER PT.  . Prednisone  Nausea And Vomiting  . Adhesive [Tape]     Paper tape causes irritation   . Erythromycin Hives  . Metformin And Related     Diarrhea with IR and XR products  . Sulfonamide Derivatives Other (See Comments)    Does not take due to family history   Past Medical History:  Diagnosis Date  . Asthma   . Bipolar 1 disorder ([place])   . Depression   . GERD (gastroesophageal reflux disease)   . Hyperlipemia   . Hypothyroid   . Migraines     Current Outpatient Medications:  .  Accu-Chek FastClix Lancets MISC, Test blood sugars daily Dx E11.9, Disp: 102 each, Rfl: 3 .  albuterol (PROVENTIL) (2.5 MG/3ML) 0.083% nebulizer solution, Nebulize 1 vial three times daily, Disp: 150 mL, Rfl: 2 .  albuterol (PROVENTIL) (2.5 MG/3ML) 0.083% nebulizer solution, Take 3 mLs (2.5 mg total) by nebulization every 6 (six) hours as needed for wheezing or shortness of breath., Disp: 75 mL, Rfl: 12 .  albuterol (VENTOLIN HFA) 108 (90 Base) MCG/ACT inhaler, Inhale 2 puffs into the lungs every 6 (six) hours as needed for wheezing or shortness of breath., Disp: 8 g, Rfl: 1 .  atorvastatin (LIPITOR) 10 MG tablet, Take 1 tablet (10 mg total) by mouth daily., Disp: 90 tablet, Rfl: 3 .  blood glucose meter kit and supplies, Dispense based on patient and insurance preference. Check blood sugar bid and as needed. Dx: E11.9, Disp: 1 each, Rfl: 0 .  Continuous Blood  Gluc Receiver (FREESTYLE LIBRE 2 READER) DEVI, USE TO SCAN BLOOD SUGAR AS DIRECTED UP TO 6 TIMES DAILY. DX: E11.9, Disp: 2 each, Rfl: 1 .  Continuous Blood Gluc Sensor (FREESTYLE LIBRE 2 SENSOR) MISC, Use to check blood sugar as directed up to 6 times daily. DX: E11.9. 2 sensors=30 day supply, 6 sensors=90 day supply, Disp: 6 each, Rfl: 4 .  cyclobenzaprine (FLEXERIL) 10 MG tablet, Take 1 tablet (10 mg total) by mouth 3 (three) times daily as needed for muscle spasms., Disp: 60 tablet, Rfl: 5 .  dexlansoprazole (DEXILANT) 60 MG capsule, Take 1 capsule (60 mg total) by  mouth daily., Disp: 30 capsule, Rfl: 3 .  diclofenac Sodium (VOLTAREN) 1 % GEL, Apply 4 g topically 4 (four) times daily. For knee pain, Disp: 400 g, Rfl: 2 .  dicyclomine (BENTYL) 20 MG tablet, Take 1 tablet (20 mg total) by mouth every 6 (six) hours., Disp: 60 tablet, Rfl: 5 .  glucose blood (ACCU-CHEK GUIDE) test strip, Use as instructed E11.9, Disp: 100 strip, Rfl: 12 .  HYDROcodone-acetaminophen (NORCO/VICODIN) 5-325 MG tablet, Take 1 tablet by mouth every 8 (eight) hours as needed., Disp: 15 tablet, Rfl: 0 .  hydrOXYzine (ATARAX/VISTARIL) 25 MG tablet, Take 1 tablet (25 mg total) by mouth 3 (three) times daily., Disp: 90 tablet, Rfl: 5 .  ibuprofen (ADVIL) 600 MG tablet, Take 1 tablet (600 mg total) by mouth every 8 (eight) hours as needed., Disp: 30 tablet, Rfl: 1 .  insulin glargine (LANTUS SOLOSTAR) 100 UNIT/ML Solostar Pen, Inject 20-50 Units into the skin daily., Disp: 15 mL, Rfl: 1 .  Insulin Pen Needle 31G X 5 MM MISC, 1 Units by Does not apply route daily., Disp: 100 each, Rfl: 5 .  lamoTRIgine (LAMICTAL) 150 MG tablet, Take 1 tablet (150 mg total) by mouth 2 (two) times daily., Disp: 60 tablet, Rfl: 2 .  levothyroxine (SYNTHROID) 125 MCG tablet, Take 1 tablet (125 mcg total) by mouth daily., Disp: 90 tablet, Rfl: 1 .  lidocaine (LIDODERM) 5 %, Place 1 patch onto the skin daily. Remove & Discard patch within 12 hours or as directed by MD, Disp: 30 patch, Rfl: 0 .  linaclotide (LINZESS) 72 MCG capsule, Take 1 capsule (72 mcg total) by mouth daily before breakfast., Disp: 30 capsule, Rfl: 2 .  lisdexamfetamine (VYVANSE) 70 MG capsule, Take 1 capsule (70 mg total) by mouth daily., Disp: 30 capsule, Rfl: 0 .  lisinopril (ZESTRIL) 20 MG tablet, Take 1 tablet (20 mg total) by mouth daily., Disp: 90 tablet, Rfl: 3 .  montelukast (SINGULAIR) 10 MG tablet, TAKE ONE TAB BY MOUTH AT BEDTIME, Disp: 90 tablet, Rfl: 3 .  nystatin cream (MYCOSTATIN), Apply 1 application topically 2 (two) times daily.  x2 weeks, Disp: 60 g, Rfl: 0 .  pioglitazone (ACTOS) 30 MG tablet, Take 1 tablet (30 mg total) by mouth daily., Disp: 90 tablet, Rfl: 0 .  promethazine-dextromethorphan (PROMETHAZINE-DM) 6.25-15 MG/5ML syrup, Take by mouth 4 (four) times daily as needed for cough., Disp: , Rfl:  .  saxagliptin HCl (ONGLYZA) 5 MG TABS tablet, Take 1 tablet (5 mg total) by mouth daily., Disp: 90 tablet, Rfl: 3 .  sertraline (ZOLOFT) 100 MG tablet, Take 2 tablets (200 mg total) by mouth daily., Disp: 180 tablet, Rfl: 3 .  SYMBICORT 160-4.5 MCG/ACT inhaler, TAKE 2 PUFFS BY MOUTH TWICE A DAY, Disp: 10.2 g, Rfl: 0 .  traMADol (ULTRAM) 50 MG tablet, Take 1 tablet (50 mg total) by mouth every  8 (eight) hours as needed for severe pain., Disp: 90 tablet, Rfl: 5 .  HYDROcodone-homatropine (HYCODAN) 5-1.5 MG/5ML syrup, Take 5 mLs by mouth at bedtime as needed for cough. (Patient not taking: Reported on 03/26/2020), Disp: 100 mL, Rfl: 0 .  metoCLOPramide (REGLAN) 5 MG tablet, TAKE 1 TABLET (5 MG TOTAL) BY MOUTH EVERY 8 (EIGHT) HOURS AS NEEDED FOR NAUSEA. (Patient not taking: Reported on 03/26/2020), Disp: 20 tablet, Rfl: 0 Social History   Socioeconomic History  . Marital status: Divorced    Spouse name: Not on file  . Number of children: Not on file  . Years of education: Not on file  . Highest education level: Not on file  Occupational History  . Not on file  Tobacco Use  . Smoking status: Former Research scientist (life sciences)  . Smokeless tobacco: Never Used  Vaping Use  . Vaping Use: Former  Substance and Sexual Activity  . Alcohol use: No  . Drug use: No  . Sexual activity: Yes    Birth control/protection: I.U.D.  Other Topics Concern  . Not on file  Social History Narrative  . Not on file   Social Determinants of Health   Financial Resource Strain:   . Difficulty of Paying Living Expenses: Not on file  Food Insecurity:   . Worried About Charity fundraiser in the Last Year: Not on file  . Ran Out of Food in the Last Year: Not  on file  Transportation Needs:   . Lack of Transportation (Medical): Not on file  . Lack of Transportation (Non-Medical): Not on file  Physical Activity:   . Days of Exercise per Week: Not on file  . Minutes of Exercise per Session: Not on file  Stress:   . Feeling of Stress : Not on file  Social Connections:   . Frequency of Communication with Friends and Family: Not on file  . Frequency of Social Gatherings with Friends and Family: Not on file  . Attends Religious Services: Not on file  . Active Member of Clubs or Organizations: Not on file  . Attends Archivist Meetings: Not on file  . Marital Status: Not on file  Intimate Partner Violence:   . Fear of Current or Ex-Partner: Not on file  . Emotionally Abused: Not on file  . Physically Abused: Not on file  . Sexually Abused: Not on file   Family History  Problem Relation Age of Onset  . Mental illness Other   . Diabetes Other   . Healthy Mother   . Colon cancer Neg Hx   . Stomach cancer Neg Hx   . Pancreatic cancer Neg Hx     Objective: Office vital signs reviewed. BP 116/78   Pulse 97   Temp (!) 96.7 F (35.9 C)   Ht $R'5\' 8"'sI$  (1.727 m)   Wt (!) 309 lb 9.6 oz (140.4 kg)   SpO2 96%   BMI 47.07 kg/m   Physical Examination:  General: Awake, alert, morbidly obese, No acute distress HEENT: Normal, sclera white Cardio: regular rate and rhythm, S1S2 heard, no murmurs appreciated Pulm: clear to auscultation bilaterally, no wheezes, rhonchi or rales; normal work of breathing on room air MSK: ambulating independently, slow gait  Assessment/ Plan: 49 y.o. female   1. Chronic abdominal pain I reviewed her specialist notes in the chart.  She has follow-up as after mentioned.  After receiving verbal permission from the patient, I attempted to reach out to [place] at [phone] but unfortunately she was  not available to discuss patient's disability claim.  We will see if we can reach her again later  2. DDD  (degenerative disc disease), lumbar In PT, awaiting further recommendations from Sports medicine  3. Closed fracture of one rib of left side with routine healing, subsequent encounter Supportive care.  4. Concussion without loss of consciousness, subsequent encounter Improving. Follow up with [facility] scheduled for Dec  5. Chronic bronchitis, unspecified chronic bronchitis type ([place]) Has appt to est care with Pulmonology 11/18.  The Narcotic Database has been reviewed.  There were no red flags.     No orders of the defined types were placed in this encounter.  Meds ordered this encounter  Medications  . ibuprofen (ADVIL) 600 MG tablet    Sig: Take 1 tablet (600 mg total) by mouth every 8 (eight) hours as needed.    Dispense:  30 tablet    Refill:  1  . promethazine-dextromethorphan (PROMETHAZINE-DM) 6.25-15 MG/5ML syrup    Sig: Take 2.5 mLs by mouth 4 (four) times daily as needed for cough.    Dispense:  118 mL    Refill:  0  . traMADol (ULTRAM) 50 MG tablet    Sig: Take 1 tablet (50 mg total) by mouth every 8 (eight) hours as needed for severe pain.    Dispense:  90 tablet    Refill:  [place], [place] [phone]
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
## 2020-03-26 NOTE — Patient Instructions (Signed)
Access Code: [email or web address] URL: [email or web address] Date: 03/26/2020 Prepared by: Gabriela Eves  Exercises Seated Hamstring Stretch - 1 x daily - 7 x weekly - 1 sets - 3 reps - 30" hold Seated Sciatic Tensioner - 1 x daily - 7 x weekly - 1 sets - 10 reps ITB Stretch at Wall - 1 x daily - 7 x weekly - 1 sets - 3 reps - 30" hold

## 2020-03-27 ENCOUNTER — Telehealth: Payer: Self-pay | Admitting: *Deleted

## 2020-03-27 NOTE — Telephone Encounter (Signed)
PA came in for Tramadol 50 mg tabs From [facility]  Key: [id number] ( on cover my meds)   Will complete on [employer]  On preferred list   Confirmation #:[id number] W Prior Approval #:[id number]  Status:APPROVED  [facility] aware

## 2020-03-28 ENCOUNTER — Ambulatory Visit: Payer: Medicaid Other | Admitting: Pharmacist

## 2020-03-28 ENCOUNTER — Other Ambulatory Visit: Payer: Self-pay

## 2020-03-28 ENCOUNTER — Telehealth: Payer: Self-pay | Admitting: Pharmacist

## 2020-03-28 DIAGNOSIS — E119 Type 2 diabetes mellitus without complications: Secondary | ICD-10-CM

## 2020-03-28 MED ORDER — SITAGLIPTIN PHOSPHATE 100 MG PO TABS: 100.0000 mg | ORAL_TABLET | Freq: Every day | ORAL | 3 refills | Status: DC

## 2020-03-28 MED ORDER — PIOGLITAZONE HCL 45 MG PO TABS: 45.0000 mg | ORAL_TABLET | Freq: Every day | ORAL | 3 refills | Status: DC

## 2020-03-28 MED ORDER — TRESIBA FLEXTOUCH 200 UNIT/ML ~~LOC~~ SOPN: 65.0000 [IU] | PEN_INJECTOR | Freq: Every evening | SUBCUTANEOUS | 3 refills | Status: DC

## 2020-03-28 NOTE — Progress Notes (Signed)
    03/28/2020 Name: Judith [name] MRN: [id number] DOB: 12-09-1970   S:  48 yoF Presents for diabetes evaluation, education, and management Patient was referred and last seen by Primary Care Provider in 11/21.   Insurance coverage/medication affordability: medicaid  Patient reports adherence with medications. . Current diabetes medications include: lantus, onglyza, actos  Current hypertension medications include: lisinopril Goal 130/80 Current hyperlipidemia medications include: atorvastatin  Patient denies hypoglycemic events.   Patient reported dietary habits: Eats 2-3 meals/day Discussed meal planning options and Plate method for healthy eating . Avoid sugary drinks and desserts . Incorporate balanced protein, non starchy veggies, 1 serving of carbohydrate with each meal . Increase water intake . Increase physical activity as able  Patient-reported exercise habits: n/a due to GI issues at this time  O:  Lab Results  Component Value Date   HGBA1C 7.5 (H) 02/29/2020   Lipid Panel     Component Value Date/Time   CHOL 209 (H) 02/29/2020 1100   TRIG 201 (H) 02/29/2020 1100   HDL 31 (L) 02/29/2020 1100   CHOLHDL 6.7 (H) 02/29/2020 1100   LDLCALC 141 (H) 02/29/2020 1100    Home fasting blood sugars: 200s  2 hour post-meal/random blood sugars: 200s.   A/P:  Diabetes T2DM, patient off of GLP-1 experiencing higher post prandials.  Patient has been unable to take GLP1 due to GI issues.  We have transition her back to DPP4 (unable to obtain due to insurance) and we have increased her basal insulin. Patient is adherent with medication.   -Taking 60 units of lantus--will transition to tresiba (approved by [facility]). Increase dose to 62 units  -"Failed" onglyza-->will transition patient back to [place]  -Consider rybelsus after GI study to ensure patient is cleared to take GLP1  -Extensively discussed pathophysiology of diabetes, recommended lifestyle interventions,  dietary effects on blood sugar control  -Counseled on s/sx of and management of hypoglycemia  -Next A1C anticipated 55months.  Written patient instructions provided.  Total time in face to face counseling 30 minutes.   Follow up Pharmacist Clinic Visit in 2 weeks.   Regina Eck, PharmD, BCPS Clinical Pharmacist, [place]  [facility] Phone [phone]

## 2020-03-29 ENCOUNTER — Other Ambulatory Visit: Payer: Self-pay

## 2020-03-29 ENCOUNTER — Ambulatory Visit: Payer: Medicaid Other | Admitting: Physical Therapy

## 2020-03-29 DIAGNOSIS — M546 Pain in thoracic spine: Secondary | ICD-10-CM | POA: Diagnosis not present

## 2020-03-29 DIAGNOSIS — M545 Low back pain, unspecified: Secondary | ICD-10-CM | POA: Diagnosis not present

## 2020-03-29 DIAGNOSIS — M6281 Muscle weakness (generalized): Secondary | ICD-10-CM

## 2020-03-29 DIAGNOSIS — M542 Cervicalgia: Secondary | ICD-10-CM | POA: Diagnosis not present

## 2020-03-29 NOTE — Telephone Encounter (Signed)
[place] completed for [place] under [employer].  Confirmation #:[id number] WPrior Approval #:[id number] Status:APPROVED 03/29/2020 - 03/29/2021  Pharmacy aware.
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
## 2020-04-01 ENCOUNTER — Ambulatory Visit (HOSPITAL_COMMUNITY): Admission: RE | Admit: 2020-04-01 | Payer: Medicaid Other | Source: Ambulatory Visit

## 2020-04-01 ENCOUNTER — Ambulatory Visit (HOSPITAL_COMMUNITY)
Admission: RE | Admit: 2020-04-01 | Discharge: 2020-04-01 | Disposition: A | Discharge status: Home / Self Care | Payer: Medicaid Other | Source: Ambulatory Visit | Attending: Physician Assistant | Admitting: Physician Assistant

## 2020-04-01 ENCOUNTER — Other Ambulatory Visit: Payer: Self-pay

## 2020-04-01 DIAGNOSIS — R112 Nausea with vomiting, unspecified: Secondary | ICD-10-CM | POA: Diagnosis not present

## 2020-04-01 DIAGNOSIS — K30 Functional dyspepsia: Secondary | ICD-10-CM | POA: Diagnosis not present

## 2020-04-01 DIAGNOSIS — G8929 Other chronic pain: Secondary | ICD-10-CM | POA: Diagnosis not present

## 2020-04-01 DIAGNOSIS — K219 Gastro-esophageal reflux disease without esophagitis: Secondary | ICD-10-CM | POA: Diagnosis not present

## 2020-04-01 DIAGNOSIS — R110 Nausea: Secondary | ICD-10-CM | POA: Diagnosis not present

## 2020-04-01 DIAGNOSIS — K5900 Constipation, unspecified: Secondary | ICD-10-CM

## 2020-04-01 MED ORDER — TECHNETIUM TC 99M SULFUR COLLOID: 2.0000 | Freq: Once | INTRAVENOUS | Status: DC | PRN

## 2020-04-01 MED ORDER — TECHNETIUM TC 99M SULFUR COLLOID
2.0000 | Freq: Once | INTRAVENOUS | Status: AC
  Administered 2020-04-01: 2 via ORAL

## 2020-04-02 ENCOUNTER — Telehealth: Payer: Self-pay

## 2020-04-02 ENCOUNTER — Encounter: Payer: Self-pay | Admitting: Physical Therapy

## 2020-04-02 ENCOUNTER — Telehealth: Payer: Self-pay | Admitting: *Deleted

## 2020-04-02 ENCOUNTER — Ambulatory Visit: Payer: Medicaid Other | Admitting: Physical Therapy

## 2020-04-02 DIAGNOSIS — M546 Pain in thoracic spine: Secondary | ICD-10-CM

## 2020-04-02 DIAGNOSIS — M6281 Muscle weakness (generalized): Secondary | ICD-10-CM | POA: Diagnosis not present

## 2020-04-02 DIAGNOSIS — M545 Low back pain, unspecified: Secondary | ICD-10-CM | POA: Diagnosis not present

## 2020-04-02 DIAGNOSIS — M25562 Pain in left knee: Secondary | ICD-10-CM

## 2020-04-02 DIAGNOSIS — M542 Cervicalgia: Secondary | ICD-10-CM

## 2020-04-02 DIAGNOSIS — G8929 Other chronic pain: Secondary | ICD-10-CM

## 2020-04-02 MED ORDER — SITAGLIPTIN PHOSPHATE 100 MG PO TABS
100.0000 mg | ORAL_TABLET | Freq: Every day | ORAL | 3 refills | Status: DC
Start: 2020-04-02 — End: 2020-04-15

## 2020-04-02 MED ORDER — TRESIBA FLEXTOUCH 200 UNIT/ML ~~LOC~~ SOPN
65.0000 [IU] | PEN_INJECTOR | Freq: Every evening | SUBCUTANEOUS | 3 refills | Status: DC
Start: 2020-04-02 — End: 2020-12-06
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
## 2020-04-02 NOTE — Telephone Encounter (Signed)
Given to [place] for faxing

## 2020-04-02 NOTE — Addendum Note (Signed)
Addended by: Lottie Dawson D on: 04/02/2020 12:10 PM   Modules accepted: Orders

## 2020-04-02 NOTE — Telephone Encounter (Signed)
Please advise, patient was seen by you on 03/26/2020.

## 2020-04-02 NOTE — Telephone Encounter (Signed)
PA submitted for Dexilant via Covermymeds.

## 2020-04-03 ENCOUNTER — Other Ambulatory Visit: Payer: Self-pay

## 2020-04-03 ENCOUNTER — Telehealth: Payer: Self-pay | Admitting: Physician Assistant

## 2020-04-03 MED ORDER — METOCLOPRAMIDE HCL 5 MG PO TABS: 5.0000 mg | ORAL_TABLET | Freq: Three times a day (TID) | ORAL | 2 refills | Status: DC

## 2020-04-04 ENCOUNTER — Other Ambulatory Visit: Payer: Self-pay

## 2020-04-04 ENCOUNTER — Ambulatory Visit: Payer: Medicaid Other | Admitting: Pharmacist

## 2020-04-04 ENCOUNTER — Encounter: Payer: Self-pay | Admitting: Pulmonary Disease

## 2020-04-04 ENCOUNTER — Ambulatory Visit: Payer: Medicaid Other | Admitting: Pulmonary Disease

## 2020-04-04 ENCOUNTER — Telehealth: Payer: Self-pay | Admitting: *Deleted

## 2020-04-04 VITALS — BP 123/82

## 2020-04-04 VITALS — BP 120/80 | HR 116 | Temp 98.0°F | Ht 68.0 in | Wt 310.4 lb

## 2020-04-04 DIAGNOSIS — E119 Type 2 diabetes mellitus without complications: Secondary | ICD-10-CM | POA: Diagnosis not present

## 2020-04-04 DIAGNOSIS — J4540 Moderate persistent asthma, uncomplicated: Secondary | ICD-10-CM

## 2020-04-04 LAB — CBC WITH DIFFERENTIAL/PLATELET
Basophils Absolute: 0.1 10*3/uL (ref 0.0–0.1)
Basophils Relative: 0.7 % (ref 0.0–3.0)
Eosinophils Absolute: 0.1 10*3/uL (ref 0.0–0.7)
Eosinophils Relative: 1.6 % (ref 0.0–5.0)
HCT: 41.9 % (ref 36.0–46.0)
Hemoglobin: 13.7 g/dL (ref 12.0–15.0)
Lymphocytes Relative: 28.1 % (ref 12.0–46.0)
Lymphs Abs: 2.2 10*3/uL (ref 0.7–4.0)
MCHC: 32.7 g/dL (ref 30.0–36.0)
MCV: 80.9 fl (ref 78.0–100.0)
Monocytes Absolute: 0.7 10*3/uL (ref 0.1–1.0)
Monocytes Relative: 8.9 % (ref 3.0–12.0)
Neutro Abs: 4.7 10*3/uL (ref 1.4–7.7)
Neutrophils Relative %: 60.7 % (ref 43.0–77.0)
Platelets: 305 10*3/uL (ref 150.0–400.0)
RBC: 5.17 Mil/uL — ABNORMAL HIGH (ref 3.87–5.11)
RDW: 13.8 % (ref 11.5–15.5)
WBC: 7.7 10*3/uL (ref 4.0–10.5)

## 2020-04-04 NOTE — Telephone Encounter (Signed)
PA submitted via [place] [employer] and approved. Faxed approval to pharmacy.

## 2020-04-04 NOTE — Telephone Encounter (Signed)
Gerre Couch, LPN sent patient mychart informing her of approval.

## 2020-04-04 NOTE — Progress Notes (Signed)
° ° °  04/04/2020 Name: Judith [name] MRN: [id number] DOB: [birth year]   S:  37 yoF Presents for diabetes evaluation, education, and management Patient was referred and last seen by Primary Care Provider on 03/26/20.  Insurance coverage/medication affordability: medicaid   Patient reports adherence with medications.  Current diabetes medications include: januvia, switching to tresiba (previously on lantus), actos  Current hypertension medications include: lisinopril Goal 130/80  Current hyperlipidemia medications include: atorvastatin  Patient denies hypoglycemic events.   Patient reported dietary habits: Eats 2-3 meals/day  Discussed meal planning options and Plate method for healthy eating  Avoid sugary drinks and desserts  Incorporate balanced protein, non starchy veggies, 1 serving of carbohydrate with each meal  Increase water intake  Increase physical activity as able   The patient is asked to make an attempt to improve diet and exercise patterns to aid in medical management of this problem.  Patient-reported exercise habits: n/a due to health/GI issues     O:  Lab Results  Component Value Date   HGBA1C 7.5 (H) 02/29/2020    Vitals:   04/10/20 1522  BP: 123/82       Lipid Panel     Component Value Date/Time   CHOL 209 (H) 02/29/2020 1100   TRIG 201 (H) 02/29/2020 1100   HDL 31 (L) 02/29/2020 1100   CHOLHDL 6.7 (H) 02/29/2020 1100   LDLCALC 141 (H) 02/29/2020 1100    Home fasting blood sugars: 180-200, avg 172  2 hour post-meal/random blood sugars: >200.  A/P: Diabetes T2DM, patient off of GLP-1 experiencing higher post prandials.  Patient has been unable to take GLP1 due to GI issues.  We have transitioned her back to DPP4 (finally able to obtain [place]) and we have increased her basal insulin. Patient is adherent with medication.   -Taking 60 units of lantus--will transition to tresiba (approved by [facility]). Increase dose to 62  units  -"Failed" onglyza-->will transition patient back to [place]  -Consider rybelsus after GI clearance  To aid in weight loss endeavors   -Gastric emptying test showed gastroparesis --now on reglan 4 times daily --new start dexilant is working for patient  -Extensively discussed pathophysiology of diabetes, recommended lifestyle interventions, dietary effects on blood sugar control  -Counseled on s/sx of and management of hypoglycemia  -Next A1C anticipated 05/2020.  Written patient instructions provided.  Total time in face to face counseling 30 minutes.   Follow up PCP Clinic Visit in 05/2020.    Regina Eck, PharmD, BCPS Clinical Pharmacist, [place]  II Phone [phone]

## 2020-04-04 NOTE — Telephone Encounter (Signed)
Spoke with patient, she had questions regarding small hernia that was noted during EGD, pt states that she has been experiencing a cough, advised that a hernia could cause an increase in reflux which could cause a cough, advised that the hernia was fairly small and taking medications as directed is important to help reduce symptoms. Pt states that she also has chronic bronchitis and asthma, she states that she has an appt with a pulmonologist today. Pt states that she has not been able to get a prescription for Dexilant because they are waiting on a PA and she states that the pharmacy does not have the [place] prescription. Advised that I would have to check on the prescriptions and get back with her. Patient advised that I can send information through My Chart, she had no other concerns at the end of the call.  Pt states that the pharmacy told her that they did not received the [place] prescription, advised that we had a receipt confirmation, I called the pharmacy and they stated that they do have the [employer] prescriptions and the patient filled it on 03/25/20.    Per Barbette Or CMA, a PA for Dexilant has been submitted through [facility] Tracks with the patient's updated insurance information and has been approved. Will notify patient via My Chart.

## 2020-04-04 NOTE — Telephone Encounter (Signed)
PA for [facility]  [place] Tracks confirmation #- [id number]  Records faxed over with cover sheet

## 2020-04-04 NOTE — Patient Instructions (Addendum)
Nice to meet you!  We will get labs today to investigate if we can use injectable medicines to treat your symptoms that I think are related to poorly controlled asthma.  Based on the results, we will discuss options.  Return to clinic for follow up in 2 months.  We will get breathing (pulmonary function tests) when you return.

## 2020-04-04 NOTE — Progress Notes (Signed)
_0  ID: Judith [name], female    DOB: 08/24/1970, 49 y.o.   MRN: [id number]  Chief Complaint  Patient presents with  . Consult    hx of chronic bronchitis and asthma.  still coughing after 2 steroid shots, having more asthma attacks, usually under control.  gets sob with coughing, has to nebulizer.  fractured a rib within the last month with coughing.    Referring provider: Janora Norlander, DO  HPI:   Judith [name] is a 49 year old woman with past my history of asthma whom are seen in consultation at the request of Adam Phenix, DO for evaluation of chronic bronchitis and asthma.  Notes from referring provider reviewed.  Patient had asthma for very long time.  She is had recurrent bouts of bronchitis throughout her life.  These are characterized by increased cough, shortness of breath.  Usually cough is productive of sputum.  In general, she thinks her asthma is well controlled on high-dose Symbicort and montelukast.  However she does have these intermittent episodes of uncontrolled coughing and shortness of breath.  Usually there adequately treated with steroid injection.  She notes that she cannot tolerate oral prednisone due to nausea and vomiting.  She is needing a couple of steroid injections over the last couple of months.  She thinks the frequency of these bouts are a bit worse over the last 6 months to 1 year.  No clear trigger.  Does not seem to be exacerbated or set off by viral infection.  Denies significant seasonal allergies to suggest seasonal change.  Review of prior images demonstrates relatively newly discovered small hiatal hernia.  She denies heartburn but does take PPI for this.  Review of labs indicate elevated eosinophils of 300 in the past.  Most recent chest x-ray 01/22/2020 personally reviewed interpreted as clear lungs.  PMH: Asthma, diabetes, hyperlipidemia, migraines, sleep apnea Surgical history: Cholecystectomy in 2008 Family history: Allergies, asthma,  heart disease run in her family Social history: Former smoker quit in 1994, 5-pack-year smoking history, works at [employer] on [address] at the old [place].   Questionaires / Pulmonary Flowsheets:   ACT:  Asthma Control Test ACT Total Score  04/04/2020 9    MMRC: No flowsheet data found.  [facility]worth:  No flowsheet data found.  Tests:   FENO:  No results found for: NITRICOXIDE  PFT: No flowsheet data found.  WALK:  No flowsheet data found.  Imaging: NM Gastric Emptying  Result Date: 04/01/2020 CLINICAL DATA:  Chronic abdominal pain. Nausea and vomiting. Gastroesophageal reflux disease. Diabetes. EXAM: NUCLEAR MEDICINE GASTRIC EMPTYING SCAN TECHNIQUE: After oral ingestion of radiolabeled meal, sequential abdominal images were obtained for 4 hours. Percentage of activity emptying the stomach was calculated at 1 hour, 2 hour, 3 hour, and 4 hours. RADIOPHARMACEUTICALS:  2.0 mCi Tc-59msulfur colloid in standardized meal COMPARISON:  None. FINDINGS: Expected location of the stomach in the left upper quadrant. Ingested meal empties the stomach gradually over the course of the study. 13% emptied at 1 hr ( normal >= 10%) 24% emptied at 2 hr ( normal >= 40%) 35% emptied at 3 hr ( normal >= 70%) 46% emptied at 4 hr ( normal >= 90%) IMPRESSION: Delayed gastric emptying study. Electronically Signed   By: JMarlaine HindM.D.   On: 04/01/2020 16:25    Lab Results: Personally reviewed and as discussed in this note CBC    Component Value Date/Time   WBC 9.3 12/01/2019 1202   WBC 10.3  11/02/2018 2043   RBC 5.49 (H) 12/01/2019 1202   RBC 5.46 (H) 11/02/2018 2043   HGB 14.4 12/01/2019 1202   HCT 45.6 12/01/2019 1202   PLT 372 12/01/2019 1202   MCV 83 12/01/2019 1202   MCH 26.2 (L) 12/01/2019 1202   MCH 26.6 11/02/2018 2043   MCHC 31.6 12/01/2019 1202   MCHC 32.9 11/02/2018 2043   RDW 13.5 12/01/2019 1202   LYMPHSABS 2.8 12/01/2019 1202   MONOABS 0.9 07/13/2010 1849    EOSABS 0.2 12/01/2019 1202   BASOSABS 0.1 12/01/2019 1202    BMET    Component Value Date/Time   NA 139 02/29/2020 1100   K 4.5 02/29/2020 1100   CL 98 02/29/2020 1100   CO2 28 02/29/2020 1100   GLUCOSE 167 (H) 02/29/2020 1100   GLUCOSE 550 (HH) 11/02/2018 2043   BUN 17 02/29/2020 1100   CREATININE 0.78 02/29/2020 1100   CALCIUM 9.2 02/29/2020 1100   GFRNONAA 89 02/29/2020 1100   GFRAA 103 02/29/2020 1100    BNP No results found for: BNP  ProBNP    Component Value Date/Time   PROBNP 11.8 07/28/2010 1115    Specialty Problems      Pulmonary Problems   DYSPNEA    Qualifier: Diagnosis of  By: Percival Spanish MD, Farrel Gordon           Allergies  Allergen Reactions  . Diphenhydramine Other (See Comments)    Hair feels like it is crawling  . Morphine Nausea And Vomiting    PCA PUMP N/V IV PUSH IN ER NO PROBLEM PER PT.  . Prednisone Nausea And Vomiting  . Adhesive [Tape]     Paper tape causes irritation   . Erythromycin Hives  . Metformin And Related     Diarrhea with IR and XR products  . Sulfonamide Derivatives Other (See Comments)    Does not take due to family history    Immunization History  Administered Date(s) Administered  . Influenza, Seasonal, Injecte, Preservative Fre 02/09/2018  . Influenza,inj,Quad PF,6+ Mos 02/14/2017, 02/20/2019  . Influenza-Unspecified 02/05/2020  . MMR 02/03/1971  . Moderna SARS-COVID-2 Vaccination 10/02/2019, 11/01/2019  . Pneumococcal Polysaccharide-23 02/20/2019, 11/01/2019  . Td 03/03/1985, 06/30/1994  . Tdap 12/24/2014, 08/15/2019  . Zoster Recombinat (Shingrix) 02/05/2020    Past Medical History:  Diagnosis Date  . Asthma   . Bipolar 1 disorder ([place])   . Bronchitis, chronic ([place])   . Depression   . GERD (gastroesophageal reflux disease)   . Hyperlipemia   . Hypothyroid   . Migraines     Tobacco History: Social History   Tobacco Use  Smoking Status Former Smoker  . Packs/day: 1.00  . Years: 5.00  .  Pack years: 5.00  . Quit date: 04/04/1993  . Years since quitting: 27.0  Smokeless Tobacco Never Used  Tobacco Comment   smoked when went out with friends/social smoker. pack would last a week.   Counseling given: Not Answered Comment: smoked when went out with friends/social smoker. pack would last a week.   Continue to not smoke  Outpatient Encounter Medications as of 04/04/2020  Medication Sig  . Accu-Chek FastClix Lancets MISC Test blood sugars daily Dx E11.9  . albuterol (PROVENTIL) (2.5 MG/3ML) 0.083% nebulizer solution Nebulize 1 vial three times daily  . albuterol (PROVENTIL) (2.5 MG/3ML) 0.083% nebulizer solution Take 3 mLs (2.5 mg total) by nebulization every 6 (six) hours as needed for wheezing or shortness of breath.  Marland Kitchen albuterol (VENTOLIN HFA) 108 (90 Base)  MCG/ACT inhaler Inhale 2 puffs into the lungs every 6 (six) hours as needed for wheezing or shortness of breath.  Marland Kitchen atorvastatin (LIPITOR) 10 MG tablet Take 1 tablet (10 mg total) by mouth daily.  . blood glucose meter kit and supplies Dispense based on patient and insurance preference. Check blood sugar bid and as needed. Dx: E11.9  . Continuous Blood Gluc Receiver (FREESTYLE LIBRE 2 READER) DEVI USE TO SCAN BLOOD SUGAR AS DIRECTED UP TO 6 TIMES DAILY. DX: E11.9  . Continuous Blood Gluc Sensor (FREESTYLE LIBRE 2 SENSOR) MISC Use to check blood sugar as directed up to 6 times daily. DX: E11.9. 2 sensors=30 day supply, 6 sensors=90 day supply  . cyclobenzaprine (FLEXERIL) 10 MG tablet Take 1 tablet (10 mg total) by mouth 3 (three) times daily as needed for muscle spasms.  Marland Kitchen dexlansoprazole (DEXILANT) 60 MG capsule Take 1 capsule (60 mg total) by mouth daily.  . diclofenac Sodium (VOLTAREN) 1 % GEL Apply 4 g topically 4 (four) times daily. For knee pain  . dicyclomine (BENTYL) 20 MG tablet Take 1 tablet (20 mg total) by mouth every 6 (six) hours.  Marland Kitchen glucose blood (ACCU-CHEK GUIDE) test strip Use as instructed E11.9  .  HYDROcodone-acetaminophen (NORCO/VICODIN) 5-325 MG tablet Take 1 tablet by mouth every 8 (eight) hours as needed.  . hydrOXYzine (ATARAX/VISTARIL) 25 MG tablet Take 1 tablet (25 mg total) by mouth 3 (three) times daily.  Marland Kitchen ibuprofen (ADVIL) 600 MG tablet Take 1 tablet (600 mg total) by mouth every 8 (eight) hours as needed.  . insulin degludec (TRESIBA FLEXTOUCH) 200 UNIT/ML FlexTouch Pen Inject 66 Units into the skin at bedtime.  . Insulin Pen Needle 31G X 5 MM MISC 1 Units by Does not apply route daily.  Marland Kitchen lamoTRIgine (LAMICTAL) 150 MG tablet Take 1 tablet (150 mg total) by mouth 2 (two) times daily.  Marland Kitchen levothyroxine (SYNTHROID) 125 MCG tablet Take 1 tablet (125 mcg total) by mouth daily.  Marland Kitchen lidocaine (LIDODERM) 5 % Place 1 patch onto the skin daily. Remove & Discard patch within 12 hours or as directed by MD  . linaclotide (LINZESS) 72 MCG capsule Take 1 capsule (72 mcg total) by mouth daily before breakfast.  . lisdexamfetamine (VYVANSE) 70 MG capsule Take 1 capsule (70 mg total) by mouth daily.  . metoCLOPramide (REGLAN) 5 MG tablet Take 1 tablet (5 mg total) by mouth 4 (four) times daily -  before meals and at bedtime.  . montelukast (SINGULAIR) 10 MG tablet TAKE ONE TAB BY MOUTH AT BEDTIME  . nystatin cream (MYCOSTATIN) Apply 1 application topically 2 (two) times daily. x2 weeks  . pioglitazone (ACTOS) 45 MG tablet Take 1 tablet (45 mg total) by mouth daily.  . promethazine-dextromethorphan (PROMETHAZINE-DM) 6.25-15 MG/5ML syrup Take 2.5 mLs by mouth 4 (four) times daily as needed for cough.  . sertraline (ZOLOFT) 100 MG tablet Take 2 tablets (200 mg total) by mouth daily.  . sitaGLIPtin (JANUVIA) 100 MG tablet Take 1 tablet (100 mg total) by mouth daily.  . SYMBICORT 160-4.5 MCG/ACT inhaler TAKE 2 PUFFS BY MOUTH TWICE A DAY  . traMADol (ULTRAM) 50 MG tablet Take 1 tablet (50 mg total) by mouth every 8 (eight) hours as needed for severe pain.   Facility-Administered Encounter Medications  as of 04/04/2020  Medication  . technetium sulfur colloid (NYCOMED-Garnavillo) injection solution 2 millicurie     Review of Systems  Review of Systems  No chest pain.  No fever or chills.  No orthopnea or PND.  No history of swelling.  Comprehensive review systems otherwise negative.  Physical Exam  BP 120/80 (BP Location: Left Arm, Patient Position: Sitting, Cuff Size: Large)   Pulse (!) 116   Temp 98 F (36.7 C) (Temporal)   Ht _0  (1.727 m)   Wt (!) 310 lb 6.4 oz (140.8 kg)   SpO2 95%   BMI 47.20 kg/m   Wt Readings from Last 5 Encounters:  04/04/20 (!) 310 lb 6.4 oz (140.8 kg)  03/26/20 (!) 309 lb 9.6 oz (140.4 kg)  03/22/20 (!) 309 lb 13 oz (140.5 kg)  03/15/20 (!) 303 lb 9.6 oz (137.7 kg)  02/29/20 (!) 303 lb 9.6 oz (137.7 kg)    BMI Readings from Last 5 Encounters:  04/04/20 47.20 kg/m  03/26/20 47.07 kg/m  03/22/20 47.11 kg/m  03/15/20 46.16 kg/m  02/29/20 47.55 kg/m     Physical Exam General: Sitting up in exam chair, no acute distress Eyes: EOMI, no icterus Neck: Supple, no JVP, difficult habitus Respiratory: Clear auscultation bilaterally, distant he breath sounds Cardiovascular: Regular rhythm, no murmurs Abdomen: Nondistended, bowel sounds present MSK: No synovitis, joint effusion Neuro: Normal gait, no weakness Psych: Normal mood, full affect   Assessment & Plan:   Recurrent bronchitis: Reviewed this as a manifestation of poorly controlled asthma as opposed to separate entity.  Suspect her asthma is poorly controlled.  Possible chronic bronchitis due to cigarette smoking.  Reevaluate with PFTs in the coming weeks to months.  Asthma: On high-dose ICS/LABA via Symbicort as well as montelukast.  Do not feel symptoms are well controlled.  Contributing factors to her poorly controlled asthma include obesity as well as high suspicion for reflux when she is at high risk for given her hiatal hernia as well as delayed gastric emptying.  Will evaluate  potential targets of Biologics with CBC with differential, IgE, Rast panel today.  Notably eos have been as high as 300 in the past and would consider using IL-5 Biologics with this history if labs drawn today are not supportive.   Return in about 2 months (around 06/04/2020).   Lanier Clam, MD 04/04/2020

## 2020-04-05 ENCOUNTER — Ambulatory Visit: Payer: Medicaid Other | Admitting: Physical Therapy

## 2020-04-05 ENCOUNTER — Other Ambulatory Visit: Payer: Self-pay

## 2020-04-05 ENCOUNTER — Encounter: Payer: Self-pay | Admitting: Physical Therapy

## 2020-04-05 DIAGNOSIS — M6281 Muscle weakness (generalized): Secondary | ICD-10-CM | POA: Diagnosis not present

## 2020-04-05 DIAGNOSIS — M546 Pain in thoracic spine: Secondary | ICD-10-CM | POA: Diagnosis not present

## 2020-04-05 DIAGNOSIS — M545 Low back pain, unspecified: Secondary | ICD-10-CM | POA: Diagnosis not present

## 2020-04-05 DIAGNOSIS — M542 Cervicalgia: Secondary | ICD-10-CM

## 2020-04-05 LAB — IGE: IgE (Immunoglobulin E), Serum: 44 kU/L (ref <=114)
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
## 2020-04-07 LAB — ALLERGEN PROFILE, PERENNIAL ALLERGEN IGE

## 2020-04-09 ENCOUNTER — Other Ambulatory Visit: Payer: Self-pay

## 2020-04-09 ENCOUNTER — Ambulatory Visit: Payer: Medicaid Other | Admitting: *Deleted

## 2020-04-09 DIAGNOSIS — M546 Pain in thoracic spine: Secondary | ICD-10-CM | POA: Diagnosis not present

## 2020-04-09 DIAGNOSIS — M545 Low back pain, unspecified: Secondary | ICD-10-CM

## 2020-04-09 DIAGNOSIS — M6281 Muscle weakness (generalized): Secondary | ICD-10-CM | POA: Diagnosis not present

## 2020-04-09 DIAGNOSIS — M542 Cervicalgia: Secondary | ICD-10-CM
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
## 2020-04-09 NOTE — Telephone Encounter (Signed)
[place] [id number]  [place] R WILSON11/18/2021 DENIED

## 2020-04-09 NOTE — Telephone Encounter (Signed)
Please inform pt

## 2020-04-09 NOTE — Telephone Encounter (Signed)
Patient aware and states that pharmacy has been filling 7 days at a time

## 2020-04-10 ENCOUNTER — Encounter: Payer: Self-pay | Admitting: Pharmacist

## 2020-04-12 ENCOUNTER — Encounter: Payer: Self-pay | Admitting: Family Medicine

## 2020-04-15 ENCOUNTER — Telehealth: Payer: Self-pay | Admitting: Pharmacist

## 2020-04-15 ENCOUNTER — Telehealth: Payer: Self-pay | Admitting: *Deleted

## 2020-04-15 MED ORDER — SITAGLIPTIN PHOSPHATE 100 MG PO TABS
100.0000 mg | ORAL_TABLET | Freq: Every day | ORAL | 3 refills | Status: DC
Start: 2020-04-15 — End: 2020-05-23

## 2020-04-15 NOTE — Telephone Encounter (Signed)
PA in process for Januvia under [employer] confirmation# [id number]

## 2020-04-15 NOTE — Telephone Encounter (Signed)
Prior approval #[id number] confirmation # [id number] W  APPROVED 04/15/2020 - 04/15/2021   Pharmacy aware

## 2020-04-15 NOTE — Telephone Encounter (Signed)
Dr. Silas Flood patient response FYI   I trust your decision. Just keep me posted and I'll make my appt for January when your schedule opens up. I'm not any worse and not any better so I will keep doing what I've been doing and avoid getting a steroid shot. My reflux is under control with the new medicine . Thank you so much !

## 2020-04-15 NOTE — Telephone Encounter (Signed)
Dr. Silas Flood please advise.  Pt is curious about starting on a new med that was discussed at the last OV.  Thanks

## 2020-04-15 NOTE — Telephone Encounter (Signed)
Calling in medication (Januvia) again--awaiting PA

## 2020-04-16 ENCOUNTER — Ambulatory Visit: Payer: Medicaid Other | Admitting: Physical Therapy

## 2020-04-16 ENCOUNTER — Other Ambulatory Visit: Payer: Self-pay

## 2020-04-16 DIAGNOSIS — M6281 Muscle weakness (generalized): Secondary | ICD-10-CM

## 2020-04-16 DIAGNOSIS — M546 Pain in thoracic spine: Secondary | ICD-10-CM

## 2020-04-16 DIAGNOSIS — M545 Low back pain, unspecified: Secondary | ICD-10-CM | POA: Diagnosis not present

## 2020-04-16 DIAGNOSIS — M542 Cervicalgia: Secondary | ICD-10-CM | POA: Diagnosis not present
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
## 2020-04-18 DIAGNOSIS — Z23 Encounter for immunization: Secondary | ICD-10-CM | POA: Diagnosis not present

## 2020-04-18 NOTE — Telephone Encounter (Signed)
[place] please advise on pts question:  Is it ok for me to get my Covid booster ?

## 2020-04-19 ENCOUNTER — Encounter: Payer: Self-pay | Admitting: Physical Therapy

## 2020-04-19 ENCOUNTER — Ambulatory Visit: Payer: Medicaid Other | Attending: Family Medicine | Admitting: Physical Therapy

## 2020-04-19 ENCOUNTER — Ambulatory Visit (HOSPITAL_BASED_OUTPATIENT_CLINIC_OR_DEPARTMENT_OTHER)
Admission: RE | Admit: 2020-04-19 | Discharge: 2020-04-19 | Disposition: A | Discharge status: Home / Self Care | Payer: Medicaid Other | Source: Ambulatory Visit | Attending: Family Medicine | Admitting: Family Medicine

## 2020-04-19 ENCOUNTER — Ambulatory Visit (INDEPENDENT_AMBULATORY_CARE_PROVIDER_SITE_OTHER): Payer: Medicaid Other | Admitting: Family Medicine

## 2020-04-19 ENCOUNTER — Other Ambulatory Visit: Payer: Self-pay

## 2020-04-19 ENCOUNTER — Encounter: Payer: Self-pay | Admitting: Family Medicine

## 2020-04-19 VITALS — BP 131/79 | HR 98 | Ht 68.0 in | Wt 310.0 lb

## 2020-04-19 DIAGNOSIS — M222X1 Patellofemoral disorders, right knee: Secondary | ICD-10-CM | POA: Diagnosis not present

## 2020-04-19 DIAGNOSIS — M542 Cervicalgia: Secondary | ICD-10-CM | POA: Diagnosis not present

## 2020-04-19 DIAGNOSIS — Z96651 Presence of right artificial knee joint: Secondary | ICD-10-CM | POA: Insufficient documentation

## 2020-04-19 DIAGNOSIS — M6281 Muscle weakness (generalized): Secondary | ICD-10-CM

## 2020-04-19 DIAGNOSIS — Z96652 Presence of left artificial knee joint: Secondary | ICD-10-CM

## 2020-04-19 DIAGNOSIS — S060X0D Concussion without loss of consciousness, subsequent encounter: Secondary | ICD-10-CM

## 2020-04-19 DIAGNOSIS — M25562 Pain in left knee: Secondary | ICD-10-CM | POA: Diagnosis not present

## 2020-04-19 DIAGNOSIS — M546 Pain in thoracic spine: Secondary | ICD-10-CM

## 2020-04-19 DIAGNOSIS — M545 Low back pain, unspecified: Secondary | ICD-10-CM | POA: Diagnosis not present

## 2020-04-19 DIAGNOSIS — M222X2 Patellofemoral disorders, left knee: Secondary | ICD-10-CM | POA: Diagnosis not present
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
## 2020-04-19 NOTE — Assessment & Plan Note (Signed)
Her surgery was performed in [place].  Unsure of her last follow-up. -X-ray.
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
## 2020-04-19 NOTE — Patient Instructions (Signed)
Good to see you Please try physical therapy for the knees  I will call with the results from today   Please send me a message in MyChart with any questions or updates.  Please see me back in 5 weeks.   --Dr. Raeford Razor

## 2020-04-19 NOTE — Assessment & Plan Note (Signed)
Continues to have improvement.  Has a frontal headache intermittently. -Counseled on home exercise therapy and supportive care. -Continue physical therapy.

## 2020-04-23 ENCOUNTER — Other Ambulatory Visit: Payer: Self-pay

## 2020-04-23 ENCOUNTER — Ambulatory Visit: Payer: Medicaid Other | Admitting: Physical Therapy

## 2020-04-23 DIAGNOSIS — M545 Low back pain, unspecified: Secondary | ICD-10-CM | POA: Diagnosis not present

## 2020-04-23 DIAGNOSIS — M546 Pain in thoracic spine: Secondary | ICD-10-CM

## 2020-04-23 DIAGNOSIS — M6281 Muscle weakness (generalized): Secondary | ICD-10-CM | POA: Diagnosis not present

## 2020-04-23 DIAGNOSIS — M542 Cervicalgia: Secondary | ICD-10-CM
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
## 2020-04-25 ENCOUNTER — Ambulatory Visit (INDEPENDENT_AMBULATORY_CARE_PROVIDER_SITE_OTHER): Payer: Medicaid Other | Admitting: Family Medicine

## 2020-04-25 DIAGNOSIS — M222X1 Patellofemoral disorders, right knee: Secondary | ICD-10-CM | POA: Diagnosis not present

## 2020-04-25 DIAGNOSIS — K3184 Gastroparesis: Secondary | ICD-10-CM | POA: Diagnosis not present

## 2020-04-25 DIAGNOSIS — E1165 Type 2 diabetes mellitus with hyperglycemia: Secondary | ICD-10-CM

## 2020-04-25 DIAGNOSIS — Z794 Long term (current) use of insulin: Secondary | ICD-10-CM

## 2020-04-25 DIAGNOSIS — J4540 Moderate persistent asthma, uncomplicated: Secondary | ICD-10-CM

## 2020-04-25 DIAGNOSIS — Z96652 Presence of left artificial knee joint: Secondary | ICD-10-CM

## 2020-04-25 DIAGNOSIS — Z96651 Presence of right artificial knee joint: Secondary | ICD-10-CM

## 2020-04-25 DIAGNOSIS — M222X2 Patellofemoral disorders, left knee: Secondary | ICD-10-CM | POA: Diagnosis not present

## 2020-04-25 DIAGNOSIS — M5136 Other intervertebral disc degeneration, lumbar region: Secondary | ICD-10-CM

## 2020-04-25 DIAGNOSIS — H9311 Tinnitus, right ear: Secondary | ICD-10-CM

## 2020-04-25 MED ORDER — ONDANSETRON HCL 4 MG PO TABS: 4.0000 mg | ORAL_TABLET | Freq: Three times a day (TID) | ORAL | 0 refills | Status: DC | PRN

## 2020-04-25 NOTE — Progress Notes (Signed)
Telephone visit  Subjective: CC: f/u GI, breathing PCP: Janora Norlander, DO HPI:Judith [name] is a 49 y.o. female calls for telephone consult today. Patient provides verbal consent for consult held via phone.  Due to COVID-19 pandemic this visit was conducted virtually. This visit type was conducted due to national recommendations for restrictions regarding the COVID-19 Pandemic (e.g. social distancing, sheltering in place) in an effort to limit this patient's exposure and mitigate transmission in our community. All issues noted in this document were discussed and addressed.  A physical exam was not performed with this format.   Location of patient: home Location of provider: [facility] Others present for call: none  1. MSK pains Starts PT next week for her knee.  She is healing well from a rib standpoint. She can wear a bra again.  Sees sports medicine 06/01/2020.  She is using a cane due to weakness in knees and legs feeling unstable.  No falls but has had some near falls due to knee buckling.  2. Tinnitis She sees ENT soon for her tinnitus and ongoing headache.  3. Chronic abdominal pain/ gastroparesis She sees GI in January.  Patient intolerant to Reglan.  She is tolerating fluids and some food.  No nausea currently.  Dexilant is helping a lot with GERD symptoms.  Linzess is also helping with constipation.  Stomach pain is persistent but not as severe as previously.  She is still consuming small meals to help.  4. Asthma Seeing Asthma and allergy January 17.  Her specialist recommends against prednisone.  She will be having PFTs and there are plans to do daily shots to help control her asthma.  She is very limited in mobility right now due to bronchospasm and wheezing.  She cannot even walk around a store due to losing breath.  5. DM She finally started Januvia about 1.5 weeks ago.  She is averaging BG 160s. This is an improvement from 240s. She has continued the insulin, injecting 66u of  tresiba right now.  ROS: Per HPI  Allergies  Allergen Reactions  . Diphenhydramine Other (See Comments)    Hair feels like it is crawling  . Morphine Nausea And Vomiting    PCA PUMP N/V IV PUSH IN ER NO PROBLEM PER PT.  . Prednisone Nausea And Vomiting  . Adhesive [Tape]     Paper tape causes irritation   . Erythromycin Hives  . Metformin And Related     Diarrhea with IR and XR products  . Sulfonamide Derivatives Other (See Comments)    Does not take due to family history   Past Medical History:  Diagnosis Date  . Asthma   . Bipolar 1 disorder ([place])   . Bronchitis, chronic ([place])   . Depression   . GERD (gastroesophageal reflux disease)   . Hyperlipemia   . Hypothyroid   . Migraines     Current Outpatient Medications:  .  Accu-Chek FastClix Lancets MISC, Test blood sugars daily Dx E11.9, Disp: 102 each, Rfl: 3 .  albuterol (PROVENTIL) (2.5 MG/3ML) 0.083% nebulizer solution, Nebulize 1 vial three times daily, Disp: 150 mL, Rfl: 2 .  albuterol (PROVENTIL) (2.5 MG/3ML) 0.083% nebulizer solution, Take 3 mLs (2.5 mg total) by nebulization every 6 (six) hours as needed for wheezing or shortness of breath., Disp: 75 mL, Rfl: 12 .  albuterol (VENTOLIN HFA) 108 (90 Base) MCG/ACT inhaler, Inhale 2 puffs into the lungs every 6 (six) hours as needed for wheezing or shortness of breath., Disp:  8 g, Rfl: 1 .  atorvastatin (LIPITOR) 10 MG tablet, Take 1 tablet (10 mg total) by mouth daily., Disp: 90 tablet, Rfl: 3 .  blood glucose meter kit and supplies, Dispense based on patient and insurance preference. Check blood sugar bid and as needed. Dx: E11.9, Disp: 1 each, Rfl: 0 .  Continuous Blood Gluc Receiver (FREESTYLE LIBRE 2 READER) DEVI, USE TO SCAN BLOOD SUGAR AS DIRECTED UP TO 6 TIMES DAILY. DX: E11.9, Disp: 2 each, Rfl: 1 .  Continuous Blood Gluc Sensor (FREESTYLE LIBRE 2 SENSOR) MISC, Use to check blood sugar as directed up to 6 times daily. DX: E11.9. 2 sensors=30 day supply, 6  sensors=90 day supply, Disp: 6 each, Rfl: 4 .  cyclobenzaprine (FLEXERIL) 10 MG tablet, Take 1 tablet (10 mg total) by mouth 3 (three) times daily as needed for muscle spasms., Disp: 60 tablet, Rfl: 5 .  dexlansoprazole (DEXILANT) 60 MG capsule, Take 1 capsule (60 mg total) by mouth daily., Disp: 30 capsule, Rfl: 3 .  diclofenac Sodium (VOLTAREN) 1 % GEL, Apply 4 g topically 4 (four) times daily. For knee pain, Disp: 400 g, Rfl: 2 .  dicyclomine (BENTYL) 20 MG tablet, Take 1 tablet (20 mg total) by mouth every 6 (six) hours., Disp: 60 tablet, Rfl: 5 .  glucose blood (ACCU-CHEK GUIDE) test strip, Use as instructed E11.9, Disp: 100 strip, Rfl: 12 .  HYDROcodone-acetaminophen (NORCO/VICODIN) 5-325 MG tablet, Take 1 tablet by mouth every 8 (eight) hours as needed., Disp: 15 tablet, Rfl: 0 .  hydrOXYzine (ATARAX/VISTARIL) 25 MG tablet, Take 1 tablet (25 mg total) by mouth 3 (three) times daily., Disp: 90 tablet, Rfl: 5 .  ibuprofen (ADVIL) 600 MG tablet, Take 1 tablet (600 mg total) by mouth every 8 (eight) hours as needed., Disp: 30 tablet, Rfl: 1 .  insulin degludec (TRESIBA FLEXTOUCH) 200 UNIT/ML FlexTouch Pen, Inject 66 Units into the skin at bedtime., Disp: 15 mL, Rfl: 3 .  Insulin Pen Needle 31G X 5 MM MISC, 1 Units by Does not apply route daily., Disp: 100 each, Rfl: 5 .  lamoTRIgine (LAMICTAL) 150 MG tablet, Take 1 tablet (150 mg total) by mouth 2 (two) times daily., Disp: 60 tablet, Rfl: 2 .  levothyroxine (SYNTHROID) 125 MCG tablet, Take 1 tablet (125 mcg total) by mouth daily., Disp: 90 tablet, Rfl: 1 .  lidocaine (LIDODERM) 5 %, Place 1 patch onto the skin daily. Remove & Discard patch within 12 hours or as directed by MD, Disp: 30 patch, Rfl: 0 .  linaclotide (LINZESS) 72 MCG capsule, Take 1 capsule (72 mcg total) by mouth daily before breakfast., Disp: 30 capsule, Rfl: 2 .  lisdexamfetamine (VYVANSE) 70 MG capsule, Take 1 capsule (70 mg total) by mouth daily., Disp: 30 capsule, Rfl: 0 .   metoCLOPramide (REGLAN) 5 MG tablet, Take 1 tablet (5 mg total) by mouth 4 (four) times daily -  before meals and at bedtime., Disp: 120 tablet, Rfl: 2 .  montelukast (SINGULAIR) 10 MG tablet, TAKE ONE TAB BY MOUTH AT BEDTIME, Disp: 90 tablet, Rfl: 3 .  nystatin cream (MYCOSTATIN), Apply 1 application topically 2 (two) times daily. x2 weeks, Disp: 60 g, Rfl: 0 .  pioglitazone (ACTOS) 45 MG tablet, Take 1 tablet (45 mg total) by mouth daily., Disp: 30 tablet, Rfl: 3 .  promethazine-dextromethorphan (PROMETHAZINE-DM) 6.25-15 MG/5ML syrup, Take 2.5 mLs by mouth 4 (four) times daily as needed for cough., Disp: 118 mL, Rfl: 0 .  sertraline (ZOLOFT) 100 MG tablet,  Take 2 tablets (200 mg total) by mouth daily., Disp: 180 tablet, Rfl: 3 .  sitaGLIPtin (JANUVIA) 100 MG tablet, Take 1 tablet (100 mg total) by mouth daily., Disp: 30 tablet, Rfl: 3 .  SYMBICORT 160-4.5 MCG/ACT inhaler, TAKE 2 PUFFS BY MOUTH TWICE A DAY, Disp: 10.2 g, Rfl: 0 .  traMADol (ULTRAM) 50 MG tablet, Take 1 tablet (50 mg total) by mouth every 8 (eight) hours as needed for severe pain., Disp: 90 tablet, Rfl: 5  Assessment/ Plan: 49 y.o. female   Moderate persistent asthma without complication  Gastroparesis  Patellofemoral pain syndrome of both knees  DDD (degenerative disc disease), lumbar  History of right knee joint replacement  History of left knee replacement  Tinnitus of right ear  Type 2 diabetes mellitus with hyperglycemia, with long-term current use of insulin ([place])   Patient to follow-up with her specialist as scheduled for her breathing, stomach, joints and ears.  We will extend her forms out through the completion of those appointments and plan to reassess around 18 January here in the office  Her diabetes sounds to be under better control since initiation of Januvia.  Hopefully we see each other in January her A1c will be at goal.   Start time: 10:41am End time: 11:01am  Total time spent on patient care  (including telephone call/ virtual visit): 20 minutes  [place], [place] [phone]

## 2020-04-26 ENCOUNTER — Other Ambulatory Visit: Payer: Self-pay

## 2020-04-26 ENCOUNTER — Encounter: Payer: Self-pay | Admitting: Physical Therapy

## 2020-04-26 ENCOUNTER — Ambulatory Visit: Payer: Medicaid Other | Admitting: Physical Therapy

## 2020-04-26 DIAGNOSIS — M546 Pain in thoracic spine: Secondary | ICD-10-CM | POA: Diagnosis not present

## 2020-04-26 DIAGNOSIS — M6281 Muscle weakness (generalized): Secondary | ICD-10-CM | POA: Diagnosis not present

## 2020-04-26 DIAGNOSIS — M545 Low back pain, unspecified: Secondary | ICD-10-CM | POA: Diagnosis not present

## 2020-04-26 DIAGNOSIS — M542 Cervicalgia: Secondary | ICD-10-CM | POA: Diagnosis not present
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
## 2020-04-29 ENCOUNTER — Telehealth: Payer: Self-pay | Admitting: Family Medicine

## 2020-04-29 NOTE — Telephone Encounter (Signed)
Informed of results.   Rosemarie Ax, MD [facility] 04/29/2020, 9:01 AM

## 2020-04-30 ENCOUNTER — Ambulatory Visit: Payer: Medicaid Other | Attending: Family Medicine | Admitting: Physical Therapy

## 2020-04-30 ENCOUNTER — Other Ambulatory Visit: Payer: Self-pay

## 2020-04-30 DIAGNOSIS — M25562 Pain in left knee: Secondary | ICD-10-CM | POA: Diagnosis not present

## 2020-04-30 DIAGNOSIS — M6281 Muscle weakness (generalized): Secondary | ICD-10-CM | POA: Insufficient documentation

## 2020-04-30 DIAGNOSIS — H9313 Tinnitus, bilateral: Secondary | ICD-10-CM | POA: Diagnosis not present

## 2020-04-30 DIAGNOSIS — R262 Difficulty in walking, not elsewhere classified: Secondary | ICD-10-CM | POA: Diagnosis present

## 2020-04-30 DIAGNOSIS — G8929 Other chronic pain: Secondary | ICD-10-CM | POA: Insufficient documentation

## 2020-04-30 DIAGNOSIS — M25561 Pain in right knee: Secondary | ICD-10-CM | POA: Insufficient documentation
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
## 2020-05-03 ENCOUNTER — Encounter: Payer: Medicaid Other | Admitting: Physical Therapy

## 2020-05-07 ENCOUNTER — Ambulatory Visit: Payer: Medicaid Other | Admitting: Physical Therapy

## 2020-05-07 ENCOUNTER — Other Ambulatory Visit: Payer: Self-pay

## 2020-05-07 DIAGNOSIS — R262 Difficulty in walking, not elsewhere classified: Secondary | ICD-10-CM

## 2020-05-07 DIAGNOSIS — M25561 Pain in right knee: Secondary | ICD-10-CM

## 2020-05-07 DIAGNOSIS — G8929 Other chronic pain: Secondary | ICD-10-CM

## 2020-05-07 DIAGNOSIS — M6281 Muscle weakness (generalized): Secondary | ICD-10-CM

## 2020-05-07 DIAGNOSIS — M25562 Pain in left knee: Secondary | ICD-10-CM
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
## 2020-05-09 ENCOUNTER — Encounter: Payer: Medicaid Other | Admitting: Physical Therapy

## 2020-05-13 ENCOUNTER — Telehealth: Payer: Self-pay

## 2020-05-13 NOTE — Telephone Encounter (Signed)
I have not received anything since pt's last OV, she contacted them today to send [place] the request for her last OV notes.

## 2020-05-13 NOTE — Telephone Encounter (Signed)
Pt called stating that she just received a letter stating that her appeal for disability was denied because [employer] never received her disability forms that we were supposed to fill out and send to them. Says she is now on the verge of losing her job.  Wants Georganna Skeans or Lynden Ang to call her ASAP [phone]  (will give message to both of them)

## 2020-05-14 ENCOUNTER — Encounter: Payer: Medicaid Other | Admitting: Physical Therapy

## 2020-05-15 ENCOUNTER — Telehealth: Payer: Self-pay

## 2020-05-15 NOTE — Telephone Encounter (Signed)
Go down to 60 units of tresiba.  Call with BG readings in 1 week.

## 2020-05-15 NOTE — Telephone Encounter (Signed)
Pt confirmed that she has been in the 50's only twice. These were taken around 4:00 in the morning. She states that she is routinely under 70.

## 2020-05-15 NOTE — Telephone Encounter (Signed)
What is the lowest blood sugar that she has?  120s is not considered a low blood sugar.  Her last A1c was uncontrolled so I had be somewhat surprised if her sugar is dropping substantially.  If she is not having blood sugars less than 70 I want her to continue current course

## 2020-05-15 NOTE — Telephone Encounter (Signed)
Pt called to let Dr [name] know that her BS has been very low. Says before she even takes her sugar medicine, her levels are already in the 120s or lower.   Wants to know what Dr [name] wants her to do as far as taking her medicine.  Please advise and call patient.

## 2020-05-16 ENCOUNTER — Other Ambulatory Visit: Payer: Self-pay

## 2020-05-16 ENCOUNTER — Encounter: Payer: Self-pay | Admitting: Physical Therapy

## 2020-05-16 ENCOUNTER — Ambulatory Visit: Payer: Medicaid Other | Admitting: Physical Therapy

## 2020-05-16 DIAGNOSIS — R262 Difficulty in walking, not elsewhere classified: Secondary | ICD-10-CM

## 2020-05-16 DIAGNOSIS — M25561 Pain in right knee: Secondary | ICD-10-CM

## 2020-05-16 DIAGNOSIS — M25562 Pain in left knee: Secondary | ICD-10-CM

## 2020-05-16 DIAGNOSIS — G8929 Other chronic pain: Secondary | ICD-10-CM

## 2020-05-16 DIAGNOSIS — M6281 Muscle weakness (generalized): Secondary | ICD-10-CM
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
## 2020-05-16 NOTE — Telephone Encounter (Signed)
Notified patient - she expressed understanding

## 2020-05-21 ENCOUNTER — Encounter: Payer: Medicaid Other | Admitting: Physical Therapy

## 2020-05-23 ENCOUNTER — Encounter: Payer: Self-pay | Admitting: Pharmacist

## 2020-05-23 ENCOUNTER — Ambulatory Visit: Payer: Medicaid Other | Attending: Family Medicine | Admitting: Physical Therapy

## 2020-05-23 ENCOUNTER — Ambulatory Visit: Payer: Medicaid Other | Admitting: Pharmacist

## 2020-05-23 ENCOUNTER — Other Ambulatory Visit: Payer: Self-pay

## 2020-05-23 VITALS — BP 130/79 | Wt 316.2 lb

## 2020-05-23 DIAGNOSIS — E119 Type 2 diabetes mellitus without complications: Secondary | ICD-10-CM | POA: Diagnosis not present

## 2020-05-23 DIAGNOSIS — M6281 Muscle weakness (generalized): Secondary | ICD-10-CM | POA: Insufficient documentation

## 2020-05-23 DIAGNOSIS — M25562 Pain in left knee: Secondary | ICD-10-CM | POA: Insufficient documentation

## 2020-05-23 DIAGNOSIS — M25561 Pain in right knee: Secondary | ICD-10-CM | POA: Diagnosis present

## 2020-05-23 DIAGNOSIS — G8929 Other chronic pain: Secondary | ICD-10-CM | POA: Insufficient documentation

## 2020-05-23 DIAGNOSIS — R262 Difficulty in walking, not elsewhere classified: Secondary | ICD-10-CM | POA: Insufficient documentation

## 2020-05-23 MED ORDER — PIOGLITAZONE HCL 30 MG PO TABS: 30.0000 mg | ORAL_TABLET | Freq: Every day | ORAL | 3 refills | Status: DC

## 2020-05-23 MED ORDER — FREESTYLE PRECISION NEO TEST VI STRP: ORAL_STRIP | 12 refills | Status: DC
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
## 2020-05-23 NOTE — Telephone Encounter (Signed)
Pt was in office today and was informed by a colleague that form from [place] for request was not received

## 2020-05-23 NOTE — Progress Notes (Signed)
316.2 lbs     05/23/2020 Name: Judith [name] MRN: [id number] DOB: July 31, 1970   S:  49 yoF Presents for diabetes evaluation, education, and management Patient was referred and last seen by Primary Care Provider on 04/25/20.  Insurance coverage/medication affordability: medicaid  Patient reports adherence with medications. . Current diabetes medications include: Trinda Pascal, januvia . Current hypertension medications include: lisinopril Goal 130/80 . Current hyperlipidemia medications include: atorvastatin    O:  Lab Results  Component Value Date   HGBA1C 7.5 (H) 02/29/2020    There were no vitals filed for this visit.   Lipid Panel     Component Value Date/Time   CHOL 209 (H) 02/29/2020 1100   TRIG 201 (H) 02/29/2020 1100   HDL 31 (L) 02/29/2020 1100   CHOLHDL 6.7 (H) 02/29/2020 1100   LDLCALC 141 (H) 02/29/2020 1100   Libre2 was reading 50-70s at night, but sensor was malfunctioning New BG avg 160  Clinical Atherosclerotic Cardiovascular Disease (ASCVD): No   The 10-year ASCVD risk score [name] DC Jr., et al., 2013) is: 5.4%   Values used to calculate the score:     Age: 45 years     Sex: Female     Is Non-Hispanic African American: No     Diabetic: Yes     Tobacco smoker: No     Systolic Blood Pressure: 131 mmHg     Is BP treated: No     HDL Cholesterol: 31 mg/dL     Total Cholesterol: 209 mg/dL    A/P:  Diabetes H3ZJ currently uncontrlled.  Patient is adherent with medication. Recent libre2 sensor was malfunctioning; new sensor is now accurate.  Patient is not having lows.  Encouraged patient to always test against ANY reading in which she feels is not her normal.  Test strips called in the work with freestyle libre 2 CGM.  -HOLD Januvia    -Continue Tresiba 66 units nightly-->hoping to decrease dose, patient aware that she can decrease by 2-4 units if Blood sugar is dropping  -START Rybelsus 3mg  daily--patient to keep aware of side effects,etc.   Will titrate slow  -patient has tried and failed trulicity, metformin-->will have to complete PA  -Decrease pioglitazone to 30mg  daily  -Extensively discussed pathophysiology of diabetes, recommended lifestyle interventions, dietary effects on blood sugar control  -Counseled on s/sx of and management of hypoglycemia  -Next A1C anticipated 06/05/20.  Written patient instructions provided.  Total time in face to face counseling 25 minutes.   Follow up PCP Clinic Visit ON 06/05/20 .    06/07/20, PharmD, BCPS Clinical Pharmacist, [facility]  II Phone [phone]

## 2020-05-24 ENCOUNTER — Encounter: Payer: Medicaid Other | Admitting: Physical Therapy

## 2020-05-28 ENCOUNTER — Other Ambulatory Visit: Payer: Self-pay

## 2020-05-28 ENCOUNTER — Encounter: Payer: Self-pay | Admitting: Physical Therapy

## 2020-05-28 ENCOUNTER — Ambulatory Visit: Payer: Medicaid Other | Admitting: Physical Therapy

## 2020-05-28 DIAGNOSIS — M6281 Muscle weakness (generalized): Secondary | ICD-10-CM

## 2020-05-28 DIAGNOSIS — R262 Difficulty in walking, not elsewhere classified: Secondary | ICD-10-CM

## 2020-05-28 DIAGNOSIS — M25562 Pain in left knee: Secondary | ICD-10-CM | POA: Diagnosis not present

## 2020-05-28 DIAGNOSIS — R32 Unspecified urinary incontinence: Secondary | ICD-10-CM | POA: Diagnosis not present

## 2020-05-28 DIAGNOSIS — G8929 Other chronic pain: Secondary | ICD-10-CM
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
## 2020-05-29 ENCOUNTER — Telehealth: Payer: Self-pay

## 2020-05-29 NOTE — Telephone Encounter (Signed)
Insulin down to 50 units FBG avg 129  Patient tolerating rybelsus 3mg  with no issues Will increase rybelsus to 7mg  next month as tolerated Lancets for accu chek guide ordered

## 2020-05-30 ENCOUNTER — Other Ambulatory Visit: Payer: Self-pay

## 2020-05-30 ENCOUNTER — Encounter: Payer: Self-pay | Admitting: Physical Therapy

## 2020-05-30 ENCOUNTER — Ambulatory Visit: Payer: Medicaid Other | Admitting: Physical Therapy

## 2020-05-30 ENCOUNTER — Ambulatory Visit: Payer: Medicaid Other | Admitting: Gastroenterology

## 2020-05-30 DIAGNOSIS — M25562 Pain in left knee: Secondary | ICD-10-CM

## 2020-05-30 DIAGNOSIS — M6281 Muscle weakness (generalized): Secondary | ICD-10-CM

## 2020-05-30 DIAGNOSIS — R262 Difficulty in walking, not elsewhere classified: Secondary | ICD-10-CM

## 2020-05-30 DIAGNOSIS — G8929 Other chronic pain: Secondary | ICD-10-CM

## 2020-05-30 MED ORDER — RYBELSUS 7 MG PO TABS: 7.0000 mg | ORAL_TABLET | Freq: Every day | ORAL | 2 refills | Status: DC

## 2020-05-30 MED ORDER — ACCU-CHEK FASTCLIX LANCETS MISC
3 refills | Status: DC
Start: 2020-05-30 — End: 2020-06-25
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
## 2020-05-31 ENCOUNTER — Telehealth: Payer: Self-pay | Admitting: *Deleted

## 2020-05-31 ENCOUNTER — Other Ambulatory Visit (HOSPITAL_COMMUNITY)
Admission: RE | Admit: 2020-05-31 | Discharge: 2020-05-31 | Disposition: A | Discharge status: Home / Self Care | Payer: Medicaid Other | Source: Ambulatory Visit | Attending: Pulmonary Disease | Admitting: Pulmonary Disease

## 2020-05-31 ENCOUNTER — Telehealth: Payer: Self-pay | Admitting: Pharmacist

## 2020-05-31 ENCOUNTER — Ambulatory Visit: Payer: Medicaid Other | Admitting: Family Medicine

## 2020-05-31 DIAGNOSIS — Z01812 Encounter for preprocedural laboratory examination: Secondary | ICD-10-CM | POA: Diagnosis not present

## 2020-05-31 DIAGNOSIS — Z20822 Contact with and (suspected) exposure to covid-19: Secondary | ICD-10-CM | POA: Insufficient documentation

## 2020-05-31 NOTE — Progress Notes (Deleted)
  Judith [name] - 50 y.o. female MRN [id number]  Date of birth: [birth year]  SUBJECTIVE:  Including CC & ROS.  No chief complaint on file.   Judith [name] is a 50 y.o. female that is  ***.  ***   Review of Systems See HPI   HISTORY: Past Medical, Surgical, Social, and Family History Reviewed & Updated per EMR.   Pertinent Historical Findings include:  Past Medical History:  Diagnosis Date  . Asthma   . Bipolar 1 disorder ([place])   . Bronchitis, chronic ([place])   . Depression   . GERD (gastroesophageal reflux disease)   . Hyperlipemia   . Hypothyroid   . Migraines     Past Surgical History:  Procedure Laterality Date  . ABDOMINAL SURGERY    . CARPAL TUNNEL RELEASE Bilateral 1999  . CESAREAN SECTION    . CHOLECYSTECTOMY    . THYROIDECTOMY    . TONSILLECTOMY      Family History  Problem Relation Age of Onset  . Mental illness Other   . Diabetes Other   . Healthy Mother   . Colon cancer Neg Hx   . Stomach cancer Neg Hx   . Pancreatic cancer Neg Hx     Social History   Socioeconomic History  . Marital status: Divorced    Spouse name: Not on file  . Number of children: Not on file  . Years of education: Not on file  . Highest education level: Not on file  Occupational History  . Not on file  Tobacco Use  . Smoking status: Former Smoker    Packs/day: 1.00    Years: 5.00    Pack years: 5.00    Quit date: 04/04/1993    Years since quitting: 27.1  . Smokeless tobacco: Never Used  . Tobacco comment: smoked when went out with friends/social smoker. pack would last a week.  Vaping Use  . Vaping Use: Former  Substance and Sexual Activity  . Alcohol use: No  . Drug use: No  . Sexual activity: Yes    Birth control/protection: I.U.D.  Other Topics Concern  . Not on file  Social History Narrative  . Not on file   Social Determinants of Health   Financial Resource Strain: Not on file  Food Insecurity: Not on file  Transportation Needs: Not on file  Physical  Activity: Not on file  Stress: Not on file  Social Connections: Not on file  Intimate Partner Violence: Not on file     PHYSICAL EXAM:  VS: There were no vitals taken for this visit. Physical Exam Gen: NAD, alert, cooperative with exam, well-appearing MSK:  ***      ASSESSMENT & PLAN:   No problem-specific Assessment & Plan notes found for this encounter.
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
## 2020-06-01 LAB — SARS CORONAVIRUS 2 (TAT 6-24 HRS): SARS Coronavirus 2: NEGATIVE

## 2020-06-03 ENCOUNTER — Ambulatory Visit: Payer: Medicaid Other | Admitting: Pulmonary Disease
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
## 2020-06-04 ENCOUNTER — Encounter: Payer: Self-pay | Admitting: Gastroenterology

## 2020-06-04 ENCOUNTER — Telehealth (INDEPENDENT_AMBULATORY_CARE_PROVIDER_SITE_OTHER): Payer: Medicaid Other | Admitting: Gastroenterology

## 2020-06-04 ENCOUNTER — Encounter: Payer: Medicaid Other | Admitting: Physical Therapy

## 2020-06-04 DIAGNOSIS — R1013 Epigastric pain: Secondary | ICD-10-CM

## 2020-06-04 DIAGNOSIS — K5900 Constipation, unspecified: Secondary | ICD-10-CM | POA: Diagnosis not present

## 2020-06-04 DIAGNOSIS — R1310 Dysphagia, unspecified: Secondary | ICD-10-CM

## 2020-06-04 DIAGNOSIS — K219 Gastro-esophageal reflux disease without esophagitis: Secondary | ICD-10-CM | POA: Diagnosis not present

## 2020-06-04 DIAGNOSIS — K3184 Gastroparesis: Secondary | ICD-10-CM

## 2020-06-04 NOTE — Patient Instructions (Addendum)
If you are age 51 or older, your body mass index should be between 23-30. Your There is no height or weight on file to calculate BMI. If this is out of the aforementioned range listed, please consider follow up with your Primary Care Provider.  If you are age 55 or younger, your body mass index should be between 19-25. Your There is no height or weight on file to calculate BMI. If this is out of the aformentioned range listed, please consider follow up with your Primary Care Provider.   Continue Reglan 10 mg as needed  Continue Dexilant.   _____________________________________________________________________  Judith [name] have been scheduled for an esophageal manometry test at [facility] on Monday, 06-24-20 at 8:30am. Please arrive 30 minutes prior to your procedure for registration. You will need to go to outpatient registration (1st floor of the hospital) first. Make certain to bring your insurance cards as well as a complete list of medications.  Due to recent COVID-19 restrictions implemented by our local and state authorities and in an effort to keep both patients and staff as safe as possible, our hospital system now requires COVID-19 testing prior to any scheduled hospital procedure. Please go to [place], [place], [place] [address] on Thursday, 06-20-20 at 2:55pm. This is a drive up testing site, you will not need to exit your vehicle.  You will not be billed at the time of testing but may receive a bill later depending on your insurance. The approximate cost of the test is $100. You must agree to quarantine from the time of your testing until the procedure date on 06-24-20 . This should include staying at home with ONLY the people you live with. Avoid take-out, grocery store shopping or leaving the house for any non-emergent reason. Failure to have your COVID-19 test done on the date and time you have been scheduled will result in cancellation of procedure. Please call our office at  [phone] if you have any questions.    For your Esophageal Manometry, please remember the following:  1) Do not take any muscle relaxants, xanax (alprazolam) or ativan for 1 day prior to your test as well as the day of the test.  2) Nothing to eat or drink after 12:00 midnight on the night before your test.  3) Hold all diabetic medications/insulin the morning of the test. You may eat and take your medications after the test.  It will take at least 2 weeks to receive the results of this test from your physician.  ------------------------------------------ ABOUT ESOPHAGEAL MANOMETRY Esophageal manometry (muh-NOM-uh-tree) is a test that gauges how well your esophagus works. Your esophagus is the long, muscular tube that connects your throat to your stomach. Esophageal manometry measures the rhythmic muscle contractions (peristalsis) that occur in your esophagus when you swallow. Esophageal manometry also measures the coordination and force exerted by the muscles of your esophagus.  During esophageal manometry, a thin, flexible tube (catheter) that contains sensors is passed through your nose, down your esophagus and into your stomach. Esophageal manometry can be helpful in diagnosing some mostly uncommon disorders that affect your esophagus.  Why it's done Esophageal manometry is used to evaluate the movement (motility) of food through the esophagus and into the stomach. The test measures how well the circular bands of muscle (sphincters) at the top and bottom of your esophagus open and close, as well as the pressure, strength and pattern of the wave of esophageal muscle contractions that moves food along.  What  you can expect Esophageal manometry is an outpatient procedure done without sedation. Most people tolerate it well. You may be asked to change into a hospital gown before the test starts.  During esophageal manometry  . While you are sitting up, a member of your health care team  sprays your throat with a numbing medication or puts numbing gel in your nose or both.  . A catheter is guided through your nose into your esophagus. The catheter may be sheathed in a water-filled sleeve. It doesn't interfere with your breathing. However, your eyes may water, and you may gag. You may have a slight nosebleed from irritation.  . After the catheter is in place, you may be asked to lie on your back on an exam table, or you may be asked to remain seated.  . You then swallow small sips of water. As you do, a computer connected to the catheter records the pressure, strength and pattern of your esophageal muscle contractions.  . During the test, you'll be asked to breathe slowly and smoothly, remain as still as possible, and swallow only when you're asked to do so.  . A member of your health care team may move the catheter down into your stomach while the catheter continues its measurements.  . The catheter then is slowly withdrawn. The test usually lasts 20 to 30 minutes.  After esophageal manometry  When your esophageal manometry is complete, you may return to your normal activities  This test typically takes 30-45 minutes to complete. ___________________________________________________________________________    Thank you for entrusting me with your care and for choosing [facility], Dr. Mellen Cellar

## 2020-06-04 NOTE — Progress Notes (Signed)
THIS ENCOUNTER IS A VIRTUAL VISIT DUE TO COVID-19 - PATIENT WAS NOT SEEN IN THE OFFICE. PATIENT HAS CONSENTED TO VIRTUAL VISIT / TELEMEDICINE VISIT. I WAS ABLE TO SEE HER ON [facility] VIDEO SERVICE AND AUDIO AS WELL   Location of patient: home Location of provider: office  Persons participating: myself, patient Time spent on call:  30 minutes   HPI :  50 year old female known to our practice for GERD, dysphagia, gastroparesis, constipation, here for follow-up visit to discuss all these issues.   She has had longstanding reflux symptoms.  She states historically she had been on Protonix 40 mg twice daily for a long time and then it stopped working well for her.  We had since transitioned her to Dexilant 60 mg a day and that is been working quite well for her.  She states she typically does not have any breakthrough reflux and in general is doing pretty well.  She previously had some symptoms of epigastric pain, nausea, early satiety.  There was a concern that she may have some underlying gastroparesis in light of her diabetes and was given her Reglan every 8 hours empirically this past October to see if that would help.  The Reglan was initially dosed at 5 mg every 8 hours and states that it did provide some benefit initially but did not resolve it.  She had an EGD with me in September which showed a small hiatal hernia, otherwise normal exam, empiric dilation provided due to ongoing dysphagia.  Gastric emptying study performed in November which confirmed presence of gastroparesis.  She has since had change in her diabetes regimen, Trulicity was stopped, transition to Rybelsus, and her insulin has been adjusted and her diabetes is much better controlled.  She states that 5 mg Reglan 3 times a day was not strong enough, however when taking an additional dose to 4 times a day she had some looser stool.  She has been off of it for several weeks now as her symptoms of gastroparesis have been improved and she  has not been requiring it.  She denies much nausea or vomiting.  No early satiety.  Really doing much better in this regard.  One of her main complaints is ongoing epigastric pain.  She states this is been going on for about a year or so at this point.  Pain is present all the time, never goes away.  Usually a mild nagging discomfort, can have fluctuations of severe pain upwards of 8 out of 10.  This past June she was in the [place] ED for worsening pain, had a CT scan which was negative.  EGD showed no cause for her pain.  She is status post cholecystectomy.  Her labs have shown no leukocytosis or anemia.  She does not think of anything that can make it better or worse.  When she is feeling poorly and has a flare of this pain she states she lies down which can take the edge off.  Her bowels have been much better lately, she is using Linzess only as needed for constipation.  She has Bentyl to take as needed for lower abdominal cramping, she states this does not help her upper abdominal discomfort.  She has gained weight over this time and is wondering if increased weight gain is related to this at all.  Again she is on Rybelsus but states symptoms started before she started taking this.  When she stopped Trulicity it did not make it any better.  Otherwise another major complaint she has is ongoing dysphagia.  She states this has been ongoing for years.  Has dysphagia to solids, liquids, and pills, feels that to some extent with most meals.  She had no obvious stenosis on EGD and empiric dilation was performed which did not provide any clear change to her symptoms.  In review of chart she had an EGD in 2008 with Dr. Deatra Ina any injected Botox at the [facility].  She states this provided a lot of relief for a period of time but symptoms eventually recurred.  I asked her if she is ever went to diagnosed with achalasia and she states she may have but does not recall for sure.  It sounds like she may have had a remote  manometry done years ago but I do not see anything in our files in that regard.  Most recent workup: EGD 02/07/2020 - - A 1 cm hiatal hernia was present. - The exam of the esophagus was otherwise normal. No obvious stenosis / stricture or inflammatory changes noted. - A guidewire was placed and the scope was withdrawn. Empiric dilation was performed in the entire esophagus with a Savary dilator with mild resistance at 17 mm and 18 mm. Relook endoscopy showed no mucosal wrents. Biopsies were taken with a cold forceps in the upper third of the esophagus, in the middle third of the esophagus and in the lower third of the esophagus for histology. - The entire examined stomach was normal. Biopsies were taken with a cold forceps for Helicobacter pylori testing. - The duodenal bulb and second portion of the duodenum were normal.  Colonoscopy 02/07/20 - The perianal and digital rectal examinations were normal. - The terminal ileum appeared normal. - Two sessile polyps were found in the cecum. The polyps were 3 mm in size. These polyps were removed with a cold snare. Resection and retrieval were complete. - A 3 mm polyp was found in the ascending colon. The polyp was sessile. The polyp was removed with a cold snare. Resection and retrieval were complete. - A 3 mm polyp was found in the transverse colon. The polyp was sessile. The polyp was removed with a cold snare. Resection and retrieval were complete. - Internal hemorrhoids were found during retroflexion. - The exam was otherwise without abnormality. - Biopsies for histology were taken with a cold forceps from the right colon and left colon for evaluation of microscopic colitis.  1. Surgical [P], gastric antrum and gastric body - GASTRIC ANTRAL AND OXYNTIC MUCOSA WITH NONSPECIFIC REACTIVE GASTROPATHY - WARTHIN STARRY STAIN IS NEGATIVE FOR HELICOBACTER PYLORI 2. Surgical [P], esophageal - ESOPHAGEAL SQUAMOUS MUCOSA WITH VASCULAR CONGESTION,  VASCULAR LAKES, AND SQUAMOUS BALLOONING, CONSISTENT WITH REFLUX ESOPHAGITIS - NEGATIVE FOR INCREASED INTRAEPITHELIAL EOSINOPHILS 3. 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 Surgical [P], random colon - COLONIC MUCOSA WITH NO SPECIFIC HISTOPATHOLOGIC CHANGES - NEGATIVE FOR ACUTE INFLAMMATION, INCREASED INTRAEPITHELIAL LYMPHOCYTES OR THICKENED SUBEPITHELIAL COLLAGEN TABLE   GES 04/01/20 - consistent with gastroparesis  CT scan abdomen pelvis with contrast - [facility] 10/31/19 - FINDINGS:   The visualized portions of the lower chest are unremarkable.   The gallbladder surgically absent. The liver, spleen, pancreas, and adrenal glands are unremarkable.   The kidneys enhance symmetrically without hydronephrosis. The bladder is decompressed. The uterus is present. IUD  is in place.   The small bowel is nondilated. The colon is unremarkable. The appendix is not definitively seen and maybe absent. No free air or free fluid. No bulky adenopathy. Mild aortoiliac atherosclerotic disease.   No acute osseous abnormality is seen. There is degenerative change in the spine.    IMPRESSION:  1. No acute intra-abdominal abnormality.    Past Medical History:  Diagnosis Date  . Asthma   . Bipolar 1 disorder ([place])   . Bronchitis, chronic ([place])   . Constipation   . Depression   . Dysphagia   . Gastroparesis   . GERD (gastroesophageal reflux disease)   . Hyperlipemia   . Hypothyroid   . Migraines      Past Surgical History:  Procedure Laterality Date  . ABDOMINAL SURGERY    . CARPAL TUNNEL RELEASE Bilateral 1999  . CESAREAN SECTION    . CHOLECYSTECTOMY    . THYROIDECTOMY    . TONSILLECTOMY     Family History  Problem Relation Age of Onset  .  Mental illness Other   . Diabetes Other   . Healthy Mother   . Colon cancer Neg Hx   . Stomach cancer Neg Hx   . Pancreatic cancer Neg Hx    Social History   Tobacco Use  . Smoking status: Former Smoker    Packs/day: 1.00    Years: 5.00    Pack years: 5.00    Quit date: 04/04/1993    Years since quitting: 27.1  . Smokeless tobacco: Never Used  . Tobacco comment: smoked when went out with friends/social smoker. pack would last a week.  Vaping Use  . Vaping Use: Former  Substance Use Topics  . Alcohol use: No  . Drug use: No   Current Outpatient Medications  Medication Sig Dispense Refill  . Accu-Chek FastClix Lancets MISC Test blood sugars daily Dx E11.9 102 each 3  . albuterol (PROVENTIL) (2.5 MG/3ML) 0.083% nebulizer solution Nebulize 1 vial three times daily 150 mL 2  . albuterol (PROVENTIL) (2.5 MG/3ML) 0.083% nebulizer solution Take 3 mLs (2.5 mg total) by nebulization every 6 (six) hours as needed for wheezing or shortness of breath. 75 mL 12  . albuterol (VENTOLIN HFA) 108 (90 Base) MCG/ACT inhaler Inhale 2 puffs into the lungs every 6 (six) hours as needed for wheezing or shortness of breath. 8 g 1  . atorvastatin (LIPITOR) 10 MG tablet Take 1 tablet (10 mg total) by mouth daily. 90 tablet 3  . blood glucose meter kit and supplies Dispense based on patient and insurance preference. Check blood sugar bid and as needed. Dx: E11.9 1 each 0  . Continuous Blood Gluc Receiver (FREESTYLE LIBRE 2 READER) DEVI USE TO SCAN BLOOD SUGAR AS DIRECTED UP TO 6 TIMES DAILY. DX: E11.9 2 each 1  . Continuous Blood Gluc Sensor (FREESTYLE LIBRE 2 SENSOR) MISC Use to check blood sugar as directed up to 6 times daily. DX: E11.9. 2 sensors=30 day supply, 6 sensors=90 day supply 6 each 4  . cyclobenzaprine (FLEXERIL) 10 MG tablet Take 1 tablet (10 mg total) by mouth 3 (three) times daily as needed for muscle spasms. 60 tablet 5  . dexlansoprazole (DEXILANT) 60 MG capsule Take 1 capsule (60 mg total)  by mouth daily. 30 capsule 3  . diclofenac Sodium (VOLTAREN) 1 % GEL Apply 4 g topically 4 (four) times daily. For knee pain 400 g 2  . dicyclomine (BENTYL) 20 MG tablet Take 1 tablet (20 mg total)  by mouth every 6 (six) hours. 60 tablet 5  . glucose blood (ACCU-CHEK GUIDE) test strip Use as instructed E11.9 100 strip 12  . HYDROcodone-acetaminophen (NORCO/VICODIN) 5-325 MG tablet Take 1 tablet by mouth every 8 (eight) hours as needed. 15 tablet 0  . hydrOXYzine (ATARAX/VISTARIL) 25 MG tablet Take 1 tablet (25 mg total) by mouth 3 (three) times daily. 90 tablet 5  . ibuprofen (ADVIL) 600 MG tablet Take 1 tablet (600 mg total) by mouth every 8 (eight) hours as needed. 30 tablet 1  . insulin degludec (TRESIBA FLEXTOUCH) 200 UNIT/ML FlexTouch Pen Inject 66 Units into the skin at bedtime. 15 mL 3  . Insulin Pen Needle 31G X 5 MM MISC 1 Units by Does not apply route daily. 100 each 5  . lamoTRIgine (LAMICTAL) 150 MG tablet Take 1 tablet (150 mg total) by mouth 2 (two) times daily. 60 tablet 2  . levothyroxine (SYNTHROID) 125 MCG tablet Take 1 tablet (125 mcg total) by mouth daily. 90 tablet 1  . lidocaine (LIDODERM) 5 % Place 1 patch onto the skin daily. Remove & Discard patch within 12 hours or as directed by MD 30 patch 0  . linaclotide (LINZESS) 72 MCG capsule Take 1 capsule (72 mcg total) by mouth daily before breakfast. 30 capsule 2  . lisdexamfetamine (VYVANSE) 70 MG capsule Take 1 capsule (70 mg total) by mouth daily. 30 capsule 0  . lisinopril (ZESTRIL) 10 MG tablet Take 20 mg by mouth daily.    . metoCLOPramide (REGLAN) 5 MG tablet Take 1 tablet (5 mg total) by mouth 4 (four) times daily -  before meals and at bedtime. 120 tablet 2  . montelukast (SINGULAIR) 10 MG tablet TAKE ONE TAB BY MOUTH AT BEDTIME 90 tablet 3  . nystatin cream (MYCOSTATIN) Apply 1 application topically 2 (two) times daily. x2 weeks 60 g 0  . ondansetron (ZOFRAN) 4 MG tablet Take 1 tablet (4 mg total) by mouth every 8  (eight) hours as needed for nausea or vomiting. 20 tablet 0  . pioglitazone (ACTOS) 30 MG tablet Take 1 tablet (30 mg total) by mouth daily. 90 tablet 3  . promethazine-dextromethorphan (PROMETHAZINE-DM) 6.25-15 MG/5ML syrup Take 2.5 mLs by mouth 4 (four) times daily as needed for cough. 118 mL 0  . Semaglutide (RYBELSUS) 7 MG TABS Take 7 mg by mouth daily. Start after 52m sample pack 30 tablet 2  . sertraline (ZOLOFT) 100 MG tablet Take 2 tablets (200 mg total) by mouth daily. 180 tablet 3  . SYMBICORT 160-4.5 MCG/ACT inhaler TAKE 2 PUFFS BY MOUTH TWICE A DAY 10.2 g 0  . traMADol (ULTRAM) 50 MG tablet Take 1 tablet (50 mg total) by mouth every 8 (eight) hours as needed for severe pain. 90 tablet 5   No current facility-administered medications for this visit.   Allergies  Allergen Reactions  . Diphenhydramine Other (See Comments)    Hair feels like it is crawling  . Morphine Nausea And Vomiting    PCA PUMP N/V IV PUSH IN ER NO PROBLEM PER PT.  . Prednisone Nausea And Vomiting  . Adhesive [Tape]     Paper tape causes irritation   . Erythromycin Hives  . Metformin And Related     Diarrhea with IR and XR products  . Sulfonamide Derivatives Other (See Comments)    Does not take due to family history     Review of Systems: All systems reviewed and negative except where noted in HPI.  Lab Results  Component Value Date   WBC 7.7 04/04/2020   HGB 13.7 04/04/2020   HCT 41.9 04/04/2020   MCV 80.9 04/04/2020   PLT 305.0 04/04/2020    Lab Results  Component Value Date   CREATININE 0.78 02/29/2020   BUN 17 02/29/2020   NA 139 02/29/2020   K 4.5 02/29/2020   CL 98 02/29/2020   CO2 28 02/29/2020    Lab Results  Component Value Date   ALT 12 02/29/2020   AST 9 02/29/2020   ALKPHOS 106 02/29/2020   BILITOT 0.3 02/29/2020     Physical Exam: No vitals or exam possible given virtual nature of this visit Constitutional: Pleasant,well-developed, female in no acute  distress. Psychiatric: Normal mood and affect. Behavior is normal.   ASSESSMENT AND PLAN: 50 year old female here for reassessment of the following:  Gastroparesis - negative EGD, positive GES.  I discussed gastroparesis with her and what this is.  She has had some response to Reglan previously but at higher doses which helped her, she had some loose stools.  Her diabetes appears to be better controlled in recent months and her symptoms have not really been bothering her too much lately.  I discussed options to treat gastroparesis.  She will try to avoid large meals and can continue to use Reglan as needed if this helps her.  She can take 5 to 10 mg as needed prior to a meal.  If feeling well she will not plan on taking this at all.  Discussed other options such as domperidone, would not consider that unless she failed gastroparesis and had persistent symptoms.  She can follow-up with me as needed for this issue.  Hopefully with better diabetes control this is less impactful to her quality of life  GERD - has been on a variety of regimens for this in the past, higher dose Protonix for years and had frequent breakthrough on that.  Now on Dexilant and working really well to control symptoms and doing much better on this regimen.  We will plan on continuing this for now, despite long-term risks of high-dose PPI do not think she will do well if we stop it, at least for now.  I will reassess her yearly for this issue.  Dysphagia -has chronic dysphagia as outlined above.  Recent EGD without any focal stenosis and empiric dilation without any benefit.  On review of her file she had a remote EGD in 2008 with Botox injection at the [facility] which did provide some benefit.  She is not sure if she has ever been told she has had achalasia but it sounds like she may have had a manometry procedure several years ago, we do not have any records of this on file.  I discussed options with her, I offered her esophageal manometry  to assess her motility ensure no evidence of achalasia.  She wanted to proceed with this.  Further recommendations pending results.  Epigastric pain - ongoing persistent symptoms as above, chronic pain always present but severity fluctuates.  Negative CT scan, normal labs, EGD and colonoscopy without clear cause.  S/p cholecystectomy. Treating bowel spasm is not helping.  Worse with recent weight gain and improved with lying down/positional change.  I suspect this may likely be musculoskeletal or nerve impingement.  Hard to evaluate for this on a video call, will need physical exam in the office.  I discussed this with her and we will await her manometry and see how things go, she  can use Tylenol as needed.  May consider trial of gabapentin or even a trigger point injection if clear evidence of abdominal wall pain on physical exam but will need to be seen in the office for this moving forward.  I reassured her nothing concerning on her work-up so far has been noted.  Constipation - continue Linzess PRN.  I spent 45 minutes of time total, including in depth chart review, face-to-face time with the patient (>30 minutes alone for time of call), and documenting this encounter,  Eutaw Cellar, MD [facility]

## 2020-06-04 NOTE — Progress Notes (Deleted)
_0  ID: Judith [name], female    DOB: [birth year], 50 y.o.   MRN: [id number]  No chief complaint on file.   Referring provider: Janora Norlander, DO  HPI: 50 year old female, former light smoker quit 1994 (5-pack-year history).  Past medical history significant for hypertension, moderate persistent asthma, GERD, type 2 diabetes, hypothyroidism. Patient of Dr. Silas Flood, seen for initial consult on 04/04/20.  06/05/2020 Patient presents today for 2 month fu with PFTs.  She is maintained on Symbicort 160, Ventolin prn and Singulair.   Allergies  Allergen Reactions  . Diphenhydramine Other (See Comments)    Hair feels like it is crawling  . Morphine Nausea And Vomiting    PCA PUMP N/V IV PUSH IN ER NO PROBLEM PER PT.  . Prednisone Nausea And Vomiting  . Adhesive [Tape]     Paper tape causes irritation   . Erythromycin Hives  . Metformin And Related     Diarrhea with IR and XR products  . Sulfonamide Derivatives Other (See Comments)    Does not take due to family history    Immunization History  Administered Date(s) Administered  . Influenza, Seasonal, Injecte, Preservative Fre 02/09/2018  . Influenza,inj,Quad PF,6+ Mos 02/14/2017, 02/20/2019  . Influenza-Unspecified 02/05/2020  . MMR 02/03/1971  . Moderna Sars-Covid-2 Vaccination 10/02/2019, 11/01/2019, 04/18/2020  . Pneumococcal Polysaccharide-23 02/20/2019, 11/01/2019  . Td 03/03/1985, 06/30/1994  . Tdap 12/24/2014, 08/15/2019  . Zoster Recombinat (Shingrix) 02/05/2020    Past Medical History:  Diagnosis Date  . Asthma   . Bipolar 1 disorder ([place])   . Bronchitis, chronic ([place])   . Constipation   . Depression   . Dysphagia   . Gastroparesis   . GERD (gastroesophageal reflux disease)   . Hyperlipemia   . Hypothyroid   . Migraines     Tobacco History: Social History   Tobacco Use  Smoking Status Former Smoker  . Packs/day: 1.00  . Years: 5.00  . Pack years: 5.00  . Quit date: 04/04/1993  .  Years since quitting: 27.1  Smokeless Tobacco Never Used  Tobacco Comment   smoked when went out with friends/social smoker. pack would last a week.   Counseling given: Not Answered Comment: smoked when went out with friends/social smoker. pack would last a week.   Outpatient Medications Prior to Visit  Medication Sig Dispense Refill  . Accu-Chek FastClix Lancets MISC Test blood sugars daily Dx E11.9 102 each 3  . albuterol (PROVENTIL) (2.5 MG/3ML) 0.083% nebulizer solution Nebulize 1 vial three times daily 150 mL 2  . albuterol (PROVENTIL) (2.5 MG/3ML) 0.083% nebulizer solution Take 3 mLs (2.5 mg total) by nebulization every 6 (six) hours as needed for wheezing or shortness of breath. 75 mL 12  . albuterol (VENTOLIN HFA) 108 (90 Base) MCG/ACT inhaler Inhale 2 puffs into the lungs every 6 (six) hours as needed for wheezing or shortness of breath. 8 g 1  . atorvastatin (LIPITOR) 10 MG tablet Take 1 tablet (10 mg total) by mouth daily. 90 tablet 3  . blood glucose meter kit and supplies Dispense based on patient and insurance preference. Check blood sugar bid and as needed. Dx: E11.9 1 each 0  . Continuous Blood Gluc Receiver (FREESTYLE LIBRE 2 READER) DEVI USE TO SCAN BLOOD SUGAR AS DIRECTED UP TO 6 TIMES DAILY. DX: E11.9 2 each 1  . Continuous Blood Gluc Sensor (FREESTYLE LIBRE 2 SENSOR) MISC Use to check blood sugar as directed up to 6 times daily. DX: E11.9. 2 sensors=30  day supply, 6 sensors=90 day supply 6 each 4  . cyclobenzaprine (FLEXERIL) 10 MG tablet Take 1 tablet (10 mg total) by mouth 3 (three) times daily as needed for muscle spasms. 60 tablet 5  . dexlansoprazole (DEXILANT) 60 MG capsule Take 1 capsule (60 mg total) by mouth daily. 30 capsule 3  . diclofenac Sodium (VOLTAREN) 1 % GEL Apply 4 g topically 4 (four) times daily. For knee pain 400 g 2  . dicyclomine (BENTYL) 20 MG tablet Take 1 tablet (20 mg total) by mouth every 6 (six) hours. 60 tablet 5  . glucose blood (ACCU-CHEK  GUIDE) test strip Use as instructed E11.9 100 strip 12  . HYDROcodone-acetaminophen (NORCO/VICODIN) 5-325 MG tablet Take 1 tablet by mouth every 8 (eight) hours as needed. 15 tablet 0  . hydrOXYzine (ATARAX/VISTARIL) 25 MG tablet Take 1 tablet (25 mg total) by mouth 3 (three) times daily. 90 tablet 5  . ibuprofen (ADVIL) 600 MG tablet Take 1 tablet (600 mg total) by mouth every 8 (eight) hours as needed. 30 tablet 1  . insulin degludec (TRESIBA FLEXTOUCH) 200 UNIT/ML FlexTouch Pen Inject 66 Units into the skin at bedtime. 15 mL 3  . Insulin Pen Needle 31G X 5 MM MISC 1 Units by Does not apply route daily. 100 each 5  . lamoTRIgine (LAMICTAL) 150 MG tablet Take 1 tablet (150 mg total) by mouth 2 (two) times daily. 60 tablet 2  . levothyroxine (SYNTHROID) 125 MCG tablet Take 1 tablet (125 mcg total) by mouth daily. 90 tablet 1  . lidocaine (LIDODERM) 5 % Place 1 patch onto the skin daily. Remove & Discard patch within 12 hours or as directed by MD 30 patch 0  . linaclotide (LINZESS) 72 MCG capsule Take 1 capsule (72 mcg total) by mouth daily before breakfast. 30 capsule 2  . lisdexamfetamine (VYVANSE) 70 MG capsule Take 1 capsule (70 mg total) by mouth daily. 30 capsule 0  . lisinopril (ZESTRIL) 10 MG tablet Take 20 mg by mouth daily.    . metoCLOPramide (REGLAN) 5 MG tablet Take 1 tablet (5 mg total) by mouth 4 (four) times daily -  before meals and at bedtime. 120 tablet 2  . montelukast (SINGULAIR) 10 MG tablet TAKE ONE TAB BY MOUTH AT BEDTIME 90 tablet 3  . nystatin cream (MYCOSTATIN) Apply 1 application topically 2 (two) times daily. x2 weeks 60 g 0  . ondansetron (ZOFRAN) 4 MG tablet Take 1 tablet (4 mg total) by mouth every 8 (eight) hours as needed for nausea or vomiting. 20 tablet 0  . pioglitazone (ACTOS) 30 MG tablet Take 1 tablet (30 mg total) by mouth daily. 90 tablet 3  . promethazine-dextromethorphan (PROMETHAZINE-DM) 6.25-15 MG/5ML syrup Take 2.5 mLs by mouth 4 (four) times daily as  needed for cough. 118 mL 0  . Semaglutide (RYBELSUS) 7 MG TABS Take 7 mg by mouth daily. Start after 96m sample pack 30 tablet 2  . sertraline (ZOLOFT) 100 MG tablet Take 2 tablets (200 mg total) by mouth daily. 180 tablet 3  . SYMBICORT 160-4.5 MCG/ACT inhaler TAKE 2 PUFFS BY MOUTH TWICE A DAY 10.2 g 0  . traMADol (ULTRAM) 50 MG tablet Take 1 tablet (50 mg total) by mouth every 8 (eight) hours as needed for severe pain. 90 tablet 5   No facility-administered medications prior to visit.      Review of Systems  Review of Systems   Physical Exam  There were no vitals taken for this visit.  Physical Exam   Lab Results:  CBC    Component Value Date/Time   WBC 7.7 04/04/2020 1155   RBC 5.17 (H) 04/04/2020 1155   HGB 13.7 04/04/2020 1155   HGB 14.4 12/01/2019 1202   HCT 41.9 04/04/2020 1155   HCT 45.6 12/01/2019 1202   PLT 305.0 04/04/2020 1155   PLT 372 12/01/2019 1202   MCV 80.9 04/04/2020 1155   MCV 83 12/01/2019 1202   MCH 26.2 (L) 12/01/2019 1202   MCH 26.6 11/02/2018 2043   MCHC 32.7 04/04/2020 1155   RDW 13.8 04/04/2020 1155   RDW 13.5 12/01/2019 1202   LYMPHSABS 2.2 04/04/2020 1155   LYMPHSABS 2.8 12/01/2019 1202   MONOABS 0.7 04/04/2020 1155   EOSABS 0.1 04/04/2020 1155   EOSABS 0.2 12/01/2019 1202   BASOSABS 0.1 04/04/2020 1155   BASOSABS 0.1 12/01/2019 1202    BMET    Component Value Date/Time   NA 139 02/29/2020 1100   K 4.5 02/29/2020 1100   CL 98 02/29/2020 1100   CO2 28 02/29/2020 1100   GLUCOSE 167 (H) 02/29/2020 1100   GLUCOSE 550 (HH) 11/02/2018 2043   BUN 17 02/29/2020 1100   CREATININE 0.78 02/29/2020 1100   CALCIUM 9.2 02/29/2020 1100   GFRNONAA 89 02/29/2020 1100   GFRAA 103 02/29/2020 1100    BNP No results found for: BNP  ProBNP    Component Value Date/Time   PROBNP 11.8 07/28/2010 1115    Imaging: No results found.   Assessment & Plan:   No problem-specific Assessment & Plan notes found for this  encounter.     Martyn Ehrich, NP 06/04/2020

## 2020-06-05 ENCOUNTER — Ambulatory Visit: Payer: Medicaid Other | Admitting: Family Medicine

## 2020-06-05 ENCOUNTER — Ambulatory Visit: Payer: Medicaid Other | Admitting: Primary Care

## 2020-06-05 DIAGNOSIS — Z23 Encounter for immunization: Secondary | ICD-10-CM | POA: Diagnosis not present

## 2020-06-06 ENCOUNTER — Ambulatory Visit: Payer: Medicaid Other | Admitting: Physical Therapy

## 2020-06-06 ENCOUNTER — Telehealth: Payer: Self-pay

## 2020-06-06 ENCOUNTER — Other Ambulatory Visit: Payer: Self-pay

## 2020-06-06 DIAGNOSIS — E119 Type 2 diabetes mellitus without complications: Secondary | ICD-10-CM

## 2020-06-06 DIAGNOSIS — M25562 Pain in left knee: Secondary | ICD-10-CM | POA: Diagnosis not present

## 2020-06-06 DIAGNOSIS — R262 Difficulty in walking, not elsewhere classified: Secondary | ICD-10-CM

## 2020-06-06 DIAGNOSIS — M6281 Muscle weakness (generalized): Secondary | ICD-10-CM

## 2020-06-06 DIAGNOSIS — G8929 Other chronic pain: Secondary | ICD-10-CM

## 2020-06-06 DIAGNOSIS — E039 Hypothyroidism, unspecified: Secondary | ICD-10-CM
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
## 2020-06-07 ENCOUNTER — Encounter: Payer: Medicaid Other | Admitting: Physical Therapy

## 2020-06-07 NOTE — Telephone Encounter (Signed)
Patient informed work note will be at front for pick up

## 2020-06-07 NOTE — Telephone Encounter (Signed)
Ok to provide note, labs are in

## 2020-06-10 ENCOUNTER — Encounter: Payer: Medicaid Other | Admitting: Physical Therapy

## 2020-06-10 ENCOUNTER — Ambulatory Visit: Payer: Self-pay

## 2020-06-11 ENCOUNTER — Other Ambulatory Visit: Payer: Self-pay

## 2020-06-11 ENCOUNTER — Emergency Department (INDEPENDENT_AMBULATORY_CARE_PROVIDER_SITE_OTHER)
Admission: RE | Admit: 2020-06-11 | Discharge: 2020-06-11 | Disposition: A | Discharge status: Home / Self Care | Payer: Medicaid Other | Source: Ambulatory Visit | Attending: Family Medicine | Admitting: Family Medicine

## 2020-06-11 VITALS — BP 136/93 | HR 90 | Temp 99.1°F | Resp 18

## 2020-06-11 DIAGNOSIS — R6889 Other general symptoms and signs: Secondary | ICD-10-CM | POA: Diagnosis not present

## 2020-06-11 MED ORDER — PROMETHAZINE HCL 25 MG PO TABS: 25.0000 mg | ORAL_TABLET | Freq: Four times a day (QID) | ORAL | 0 refills | Status: DC | PRN

## 2020-06-11 NOTE — ED Triage Notes (Signed)
Patient presents to Urgent Care with complaints of nausesa since 3 nights ago. Patient reports she also has generalized body aches, got her covid booster the day before her symptoms started. Mild productive cough.

## 2020-06-11 NOTE — Discharge Instructions (Addendum)
We will call with any positive results  Please stay hydrated  Please follow up if your symptoms fail to improve.

## 2020-06-11 NOTE — ED Provider Notes (Signed)
Judith [name] CARE    CSN: [id number] Arrival date & time: 06/11/20  1751      History   Chief Complaint Chief Complaint  Patient presents with  . Appointment  . Nausea    HPI Judith [name] is a 50 y.o. female.   She is presenting with nausea malaise and feeling unwell.  Symptoms been ongoing for the past few days.  Denies any fevers or chills.  HPI  Past Medical History:  Diagnosis Date  . Asthma   . Bipolar 1 disorder ([place])   . Bronchitis, chronic ([place])   . Constipation   . Depression   . Dysphagia   . Gastroparesis   . GERD (gastroesophageal reflux disease)   . Hyperlipemia   . Hypothyroid   . Migraines     Patient Active Problem List   Diagnosis Date Noted  . Patellofemoral pain syndrome of both knees 04/19/2020  . Tinnitus of right ear 03/15/2020  . Closed fracture of one rib of left side 03/15/2020  . Concussion with no loss of consciousness 02/05/2020  . Neck pain 02/05/2020  . Acute bilateral thoracic back pain 02/05/2020  . Acute bilateral low back pain without sciatica 02/05/2020  . Gastroparesis 11/03/2019  . Hospital discharge follow-up 11/03/2019  . DDD (degenerative disc disease), lumbar 06/30/2019  . Lumbar back pain with radiculopathy affecting lower extremity 06/20/2019  . 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/21/2019  . History of left knee replacement 02/10/2018  . Morbid obesity ([place]) 11/21/2017  . 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 Episodic mood disorder ([place]) 05/12/2016  . Controlled diabetes mellitus type 2 with complications ([place]) 67/04/4579  . Gastroesophageal reflux disease without esophagitis 05/12/2016  . DYSPNEA 07/22/2010  . CHEST PAIN 07/22/2010  . Hypothyroidism 09/13/2007  . Hyperlipidemia 09/13/2007  . Depression, major, single episode, moderate ([place]) 09/13/2007  . MIGRAINE HEADACHE 09/13/2007  . Essential hypertension 09/13/2007  . ASTHMA 09/13/2007  . CHOLELITHIASIS, HX OF  09/13/2007  . NEPHROLITHIASIS, HX OF 09/13/2007  . CARPAL TUNNEL RELEASE, HX OF 09/13/2007    Past Surgical History:  Procedure Laterality Date  . ABDOMINAL SURGERY    . CARPAL TUNNEL RELEASE Bilateral 1999  . CESAREAN SECTION    . CHOLECYSTECTOMY    . THYROIDECTOMY    . TONSILLECTOMY      OB History    Gravida  1   Para  1   Term  1   Preterm      AB      Living        SAB      IAB      Ectopic      Multiple      Live Births               Home Medications    Prior to Admission medications   Medication Sig Start Date End Date Taking? Authorizing Provider  promethazine (PHENERGAN) 25 MG tablet Take 1 tablet (25 mg total) by mouth every 6 (six) hours as needed for nausea or vomiting. 06/11/20  Yes Rosemarie Ax, MD  Accu-Chek FastClix Lancets MISC Test blood sugars daily Dx E11.9 05/30/20   Ronnie Doss M, DO  albuterol (PROVENTIL) (2.5 MG/3ML) 0.083% nebulizer solution Nebulize 1 vial three times daily 07/31/19   Evelina Dun A, FNP  albuterol (PROVENTIL) (2.5 MG/3ML) 0.083% nebulizer solution Take 3 mLs (2.5 mg total) by nebulization every 6 (six) hours as needed for wheezing or shortness of breath.  02/16/20   Scot Jun, FNP  albuterol (VENTOLIN HFA) 108 (90 Base) MCG/ACT inhaler Inhale 2 puffs into the lungs every 6 (six) hours as needed for wheezing or shortness of breath. 03/05/20   Janora Norlander, DO  atorvastatin (LIPITOR) 10 MG tablet Take 1 tablet (10 mg total) by mouth daily. 09/19/19   Sharion Balloon, FNP  blood glucose meter kit and supplies Dispense based on patient and insurance preference. Check blood sugar bid and as needed. Dx: E11.9 01/17/19   Terald Sleeper, PA-C  Continuous Blood Gluc Receiver (FREESTYLE LIBRE 2 READER) DEVI USE TO SCAN BLOOD SUGAR AS DIRECTED UP TO 6 TIMES DAILY. DX: E11.9 02/05/20   Janora Norlander, DO  Continuous Blood Gluc Sensor (FREESTYLE LIBRE 2 SENSOR) MISC Use to check blood sugar as directed up  to 6 times daily. DX: E11.9. 2 sensors=30 day supply, 6 sensors=90 day supply 11/06/19   Ronnie Doss M, DO  cyclobenzaprine (FLEXERIL) 10 MG tablet Take 1 tablet (10 mg total) by mouth 3 (three) times daily as needed for muscle spasms. 09/19/19   Sharion Balloon, FNP  dexlansoprazole (DEXILANT) 60 MG capsule Take 1 capsule (60 mg total) by mouth daily. 03/22/20   Levin Erp, PA  diclofenac Sodium (VOLTAREN) 1 % GEL Apply 4 g topically 4 (four) times daily. For knee pain 10/20/19   Ronnie Doss M, DO  dicyclomine (BENTYL) 20 MG tablet Take 1 tablet (20 mg total) by mouth every 6 (six) hours. 09/19/19   Evelina Dun A, FNP  glucose blood (ACCU-CHEK GUIDE) test strip Use as instructed E11.9 11/03/19   Ronnie Doss M, DO  HYDROcodone-acetaminophen (NORCO/VICODIN) 5-325 MG tablet Take 1 tablet by mouth every 8 (eight) hours as needed. 02/05/20   Rosemarie Ax, MD  hydrOXYzine (ATARAX/VISTARIL) 25 MG tablet Take 1 tablet (25 mg total) by mouth 3 (three) times daily. 10/20/19   Janora Norlander, DO  ibuprofen (ADVIL) 600 MG tablet Take 1 tablet (600 mg total) by mouth every 8 (eight) hours as needed. 03/26/20   Janora Norlander, DO  insulin degludec (TRESIBA FLEXTOUCH) 200 UNIT/ML FlexTouch Pen Inject 66 Units into the skin at bedtime. 04/02/20   Janora Norlander, DO  Insulin Pen Needle 31G X 5 MM MISC 1 Units by Does not apply route daily. 01/19/20   Janora Norlander, DO  lamoTRIgine (LAMICTAL) 150 MG tablet Take 1 tablet (150 mg total) by mouth 2 (two) times daily. 09/19/19   Sharion Balloon, FNP  levothyroxine (SYNTHROID) 125 MCG tablet Take 1 tablet (125 mcg total) by mouth daily. 03/05/20   Janora Norlander, DO  lidocaine (LIDODERM) 5 % Place 1 patch onto the skin daily. Remove & Discard patch within 12 hours or as directed by MD 02/29/20   Janora Norlander, DO  linaclotide [facility]) 72 MCG capsule Take 1 capsule (72 mcg total) by mouth daily before breakfast. 03/22/20    Levin Erp, PA  lisdexamfetamine (VYVANSE) 70 MG capsule Take 1 capsule (70 mg total) by mouth daily. 09/19/19   Evelina Dun A, FNP  lisinopril (ZESTRIL) 10 MG tablet Take 20 mg by mouth daily.    [provider]  metoCLOPramide (REGLAN) 5 MG tablet Take 1 tablet (5 mg total) by mouth 4 (four) times daily -  before meals and at bedtime. 04/03/20 05/03/20  Levin Erp, PA  montelukast (SINGULAIR) 10 MG tablet TAKE ONE TAB BY MOUTH AT BEDTIME 09/19/19   Hawks,  Christy A, FNP  nystatin cream (MYCOSTATIN) Apply 1 application topically 2 (two) times daily. x2 weeks 01/24/20   Ronnie Doss M, DO  ondansetron (ZOFRAN) 4 MG tablet Take 1 tablet (4 mg total) by mouth every 8 (eight) hours as needed for nausea or vomiting. 04/25/20   Janora Norlander, DO  pioglitazone (ACTOS) 30 MG tablet Take 1 tablet (30 mg total) by mouth daily. 05/23/20   Janora Norlander, DO  promethazine-dextromethorphan (PROMETHAZINE-DM) 6.25-15 MG/5ML syrup Take 2.5 mLs by mouth 4 (four) times daily as needed for cough. 03/26/20   Janora Norlander, DO  Semaglutide (RYBELSUS) 7 MG TABS Take 7 mg by mouth daily. Start after $RemoveBef'3mg'KZsLQNdmhC$  sample pack 05/30/20   Ronnie Doss M, DO  sertraline (ZOLOFT) 100 MG tablet Take 2 tablets (200 mg total) by mouth daily. 09/19/19   Sharion Balloon, FNP  SYMBICORT 160-4.5 MCG/ACT inhaler TAKE 2 PUFFS BY MOUTH TWICE A DAY 02/05/20   Ronnie Doss M, DO  traMADol (ULTRAM) 50 MG tablet Take 1 tablet (50 mg total) by mouth every 8 (eight) hours as needed for severe pain. 03/30/20   Janora Norlander, DO    Family History Family History  Problem Relation Age of Onset  . Mental illness Other   . Diabetes Other   . Healthy Mother   . Cancer Father   . Colon cancer Neg Hx   . Stomach cancer Neg Hx   . Pancreatic cancer Neg Hx     Social History Social History   Tobacco Use  . Smoking status: Former Smoker    Packs/day: 1.00    Years: 5.00    Pack years:  5.00    Quit date: 04/04/1993    Years since quitting: 27.2  . Smokeless tobacco: Never Used  . Tobacco comment: smoked when went out with friends/social smoker. pack would last a week.  Vaping Use  . Vaping Use: Former  Substance Use Topics  . Alcohol use: No  . Drug use: No     Allergies   Diphenhydramine, Morphine, Prednisone, Adhesive [tape], Erythromycin, Metformin and related, and Sulfonamide derivatives   Review of Systems Review of Systems  See HPI  Physical Exam Triage Vital Signs ED Triage Vitals  Enc Vitals Group     BP 06/11/20 1813 (!) 136/93     Pulse Rate 06/11/20 1813 90     Resp 06/11/20 1813 18     Temp 06/11/20 1813 99.1 F (37.3 C)     Temp Source 06/11/20 1813 Oral     SpO2 06/11/20 1813 97 %     Weight --      Height --      Head Circumference --      Peak Flow --      Pain Score 06/11/20 1811 2     Pain Loc --      Pain Edu? --      Excl. in [place]? --    No data found.  Updated Vital Signs BP (!) 136/93 (BP Location: Right Arm)   Pulse 90   Temp 99.1 F (37.3 C) (Oral)   Resp 18   SpO2 97%   Visual Acuity Right Eye Distance:   Left Eye Distance:   Bilateral Distance:    Right Eye Near:   Left Eye Near:    Bilateral Near:     Physical Exam Gen: NAD, alert, cooperative with exam, ENT: normal lips, normal nasal mucosa, tympanic membranes clear and intact bilaterally, normal  oropharynx, no cervical lymphadenopathy Eye: normal EOM, normal conjunctiva and lids CV:   regular rate and rhythm, S1-S2   Resp: no accessory muscle use, non-labored, clear to auscultation bilaterally, no crackles or wheezes Skin: no rashes, no areas of induration  Neuro: normal tone, normal sensation to touch Psych:  normal insight, alert and oriented MSK: Normal gait, normal strength    UC Treatments / Results  Labs (all labs ordered are listed, but only abnormal results are displayed) Labs Reviewed  COVID-19, FLU A+B NAA    EKG   Radiology No  results found.  Procedures Procedures (including critical care time)  Medications Ordered in UC Medications - No data to display  Initial Impression / Assessment and Plan / UC Course  I have reviewed the triage vital signs and the nursing notes.  Pertinent labs & imaging results that were available during my care of the patient were reviewed by me and considered in my medical decision making (see chart for details).     Ms. Brekke is a 50 year old female is presenting with possible viral illness.  Covid and flu swab were obtained.  Phenergan was provided for nausea.  Counseled on supportive care and given occasional follow-up.  Final Clinical Impressions(s) / UC Diagnoses   Final diagnoses:  Flu-like symptoms     Discharge Instructions     We will call with any positive results  Please stay hydrated  Please follow up if your symptoms fail to improve.     ED Prescriptions    Medication Sig Dispense Auth. Provider   promethazine (PHENERGAN) 25 MG tablet Take 1 tablet (25 mg total) by mouth every 6 (six) hours as needed for nausea or vomiting. 30 tablet Rosemarie Ax, MD     PDMP not reviewed this encounter.   Rosemarie Ax, MD 06/11/20 2157

## 2020-06-12 ENCOUNTER — Encounter: Payer: Medicaid Other | Admitting: Physical Therapy

## 2020-06-14 ENCOUNTER — Telehealth: Payer: Self-pay

## 2020-06-14 ENCOUNTER — Ambulatory Visit: Payer: Medicaid Other | Admitting: Family Medicine

## 2020-06-14 LAB — COVID-19, FLU A+B NAA
Influenza A, NAA: NOT DETECTED
Influenza B, NAA: NOT DETECTED
SARS-CoV-2, NAA: NOT DETECTED

## 2020-06-14 NOTE — Progress Notes (Deleted)
  KATIA HANNEN - 50 y.o. female MRN [id number]  Date of birth: 1970/06/13  SUBJECTIVE:  Including CC & ROS.  No chief complaint on file.   Betzy R Bines is a 50 y.o. female that is  ***.  ***   Review of Systems See HPI   HISTORY: Past Medical, Surgical, Social, and Family History Reviewed & Updated per EMR.   Pertinent Historical Findings include:  Past Medical History:  Diagnosis Date  . Asthma   . Bipolar 1 disorder ([place])   . Bronchitis, chronic ([place])   . Constipation   . Depression   . Dysphagia   . Gastroparesis   . GERD (gastroesophageal reflux disease)   . Hyperlipemia   . Hypothyroid   . Migraines     Past Surgical History:  Procedure Laterality Date  . ABDOMINAL SURGERY    . CARPAL TUNNEL RELEASE Bilateral 1999  . CESAREAN SECTION    . CHOLECYSTECTOMY    . THYROIDECTOMY    . TONSILLECTOMY      Family History  Problem Relation Age of Onset  . Mental illness Other   . Diabetes Other   . Healthy Mother   . Cancer Father   . Colon cancer Neg Hx   . Stomach cancer Neg Hx   . Pancreatic cancer Neg Hx     Social History   Socioeconomic History  . Marital status: Divorced    Spouse name: Not on file  . Number of children: Not on file  . Years of education: Not on file  . Highest education level: Not on file  Occupational History  . Not on file  Tobacco Use  . Smoking status: Former Smoker    Packs/day: 1.00    Years: 5.00    Pack years: 5.00    Quit date: 04/04/1993    Years since quitting: 27.2  . Smokeless tobacco: Never Used  . Tobacco comment: smoked when went out with friends/social smoker. pack would last a week.  Vaping Use  . Vaping Use: Former  Substance and Sexual Activity  . Alcohol use: No  . Drug use: No  . Sexual activity: Yes    Birth control/protection: I.U.D.  Other Topics Concern  . Not on file  Social History Narrative  . Not on file   Social Determinants of Health   Financial Resource Strain: Not on file  Food  Insecurity: Not on file  Transportation Needs: Not on file  Physical Activity: Not on file  Stress: Not on file  Social Connections: Not on file  Intimate Partner Violence: Not on file     PHYSICAL EXAM:  VS: There were no vitals taken for this visit. Physical Exam Gen: NAD, alert, cooperative with exam, well-appearing MSK:  ***      ASSESSMENT & PLAN:   No problem-specific Assessment & Plan notes found for this encounter.

## 2020-06-17 DIAGNOSIS — Z124 Encounter for screening for malignant neoplasm of cervix: Secondary | ICD-10-CM | POA: Diagnosis not present

## 2020-06-17 DIAGNOSIS — Z3009 Encounter for other general counseling and advice on contraception: Secondary | ICD-10-CM | POA: Diagnosis not present

## 2020-06-17 DIAGNOSIS — Z30432 Encounter for removal of intrauterine contraceptive device: Secondary | ICD-10-CM | POA: Diagnosis not present

## 2020-06-17 DIAGNOSIS — Z30013 Encounter for initial prescription of injectable contraceptive: Secondary | ICD-10-CM | POA: Diagnosis not present

## 2020-06-17 NOTE — Telephone Encounter (Signed)
Returned patients call  States this has been taken care of

## 2020-06-19 ENCOUNTER — Encounter: Payer: Self-pay | Admitting: Physical Therapy

## 2020-06-19 ENCOUNTER — Other Ambulatory Visit: Payer: Self-pay

## 2020-06-19 ENCOUNTER — Ambulatory Visit: Payer: Medicaid Other | Attending: Family Medicine | Admitting: Physical Therapy

## 2020-06-19 ENCOUNTER — Telehealth (INDEPENDENT_AMBULATORY_CARE_PROVIDER_SITE_OTHER): Payer: Medicaid Other | Admitting: Family Medicine

## 2020-06-19 DIAGNOSIS — M25562 Pain in left knee: Secondary | ICD-10-CM | POA: Diagnosis not present

## 2020-06-19 DIAGNOSIS — M222X1 Patellofemoral disorders, right knee: Secondary | ICD-10-CM

## 2020-06-19 DIAGNOSIS — M25561 Pain in right knee: Secondary | ICD-10-CM | POA: Insufficient documentation

## 2020-06-19 DIAGNOSIS — M542 Cervicalgia: Secondary | ICD-10-CM | POA: Diagnosis not present

## 2020-06-19 DIAGNOSIS — R262 Difficulty in walking, not elsewhere classified: Secondary | ICD-10-CM

## 2020-06-19 DIAGNOSIS — M6281 Muscle weakness (generalized): Secondary | ICD-10-CM

## 2020-06-19 DIAGNOSIS — G8929 Other chronic pain: Secondary | ICD-10-CM | POA: Insufficient documentation

## 2020-06-19 DIAGNOSIS — M222X2 Patellofemoral disorders, left knee: Secondary | ICD-10-CM

## 2020-06-19 MED ORDER — IBUPROFEN 600 MG PO TABS: 600.0000 mg | ORAL_TABLET | Freq: Three times a day (TID) | ORAL | 1 refills | Status: DC | PRN
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
## 2020-06-19 NOTE — Assessment & Plan Note (Addendum)
Continues to get improvement with physical therapy.  Has not reached all goals of physical therapy.  The pain has been much improved. -Counseled on exercise therapy and supportive care. -We will continue physical therapy -Provided work note
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
## 2020-06-20 ENCOUNTER — Other Ambulatory Visit (HOSPITAL_COMMUNITY): Payer: Medicaid Other

## 2020-06-21 ENCOUNTER — Other Ambulatory Visit (HOSPITAL_COMMUNITY)
Admission: RE | Admit: 2020-06-21 | Discharge: 2020-06-21 | Disposition: A | Discharge status: Home / Self Care | Payer: Medicaid Other | Source: Ambulatory Visit | Attending: Gastroenterology | Admitting: Gastroenterology

## 2020-06-21 ENCOUNTER — Other Ambulatory Visit: Payer: Self-pay

## 2020-06-21 ENCOUNTER — Ambulatory Visit: Payer: Medicaid Other | Admitting: *Deleted

## 2020-06-21 ENCOUNTER — Telehealth: Payer: Self-pay

## 2020-06-21 DIAGNOSIS — U071 COVID-19: Secondary | ICD-10-CM

## 2020-06-21 DIAGNOSIS — Z01812 Encounter for preprocedural laboratory examination: Secondary | ICD-10-CM | POA: Insufficient documentation

## 2020-06-21 DIAGNOSIS — R262 Difficulty in walking, not elsewhere classified: Secondary | ICD-10-CM

## 2020-06-21 DIAGNOSIS — M25562 Pain in left knee: Secondary | ICD-10-CM | POA: Diagnosis not present

## 2020-06-21 DIAGNOSIS — G8929 Other chronic pain: Secondary | ICD-10-CM

## 2020-06-21 DIAGNOSIS — M6281 Muscle weakness (generalized): Secondary | ICD-10-CM

## 2020-06-21 HISTORY — DX: COVID-19: U07.1

## 2020-06-21 NOTE — Telephone Encounter (Signed)
  Prescription Request  06/21/2020  What is the name of the medication or equipment? accu-chek guide  Have you contacted your pharmacy to request a refill? (if applicable) yes  Which pharmacy would you [facility] this sent to? [facility]   Patient notified that their request is being sent to the clinical staff for review and that they should receive a response within 2 business days.
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
## 2020-06-22 ENCOUNTER — Telehealth: Payer: Self-pay | Admitting: Gastroenterology

## 2020-06-22 LAB — SARS CORONAVIRUS 2 (TAT 6-24 HRS): SARS Coronavirus 2: POSITIVE — AB

## 2020-06-22 NOTE — Telephone Encounter (Signed)
Called patient and got ahold of her. She received the [facility] message and understands she needs to be rescheduled for her manometry. She has a sore throat but otherwise feels okay. She is vaccinated and boosted. Will seek evaluation if she develops concerning symptoms.   Judith [name] can you please reschedule her procedure at the next available opening, needs to be at least 10 days from her positive test. Thanks

## 2020-06-22 NOTE — Telephone Encounter (Signed)
Patient tested positive for COVID.  Supposed to have esophageal manometry on 2/7.  This will need to be rescheduled.  Endo will need to be made aware.  I attempted to contact the patient multiple times on her number listed as well as Leanne Lovely, her significant other, and was unsuccessful as it kept going to their voicemails.  I will send a mychart message as well.

## 2020-06-22 NOTE — Progress Notes (Signed)
Called Judith [name] from the on-call service of Dr. Doyne Keel with + covid results for this pt. The pt is to have surgery on Mon 2/7 at [facility] at 0830. The pt has not been notified as there will be pertinent questions the provider needs to answer.  These are the current guidelines:  Positive Results for:  Asymptomatic: Procedure postponed and quarantined for 10 days, unless the procedure is urgent. Symptomatic: Procedure postponed and quarantined for 14 days. Hospitalized with Covid: postponed and quarantined  for 21 days. Immunocompromised: Procedure postponed and quarantine for 20 days.  The pt will not be retested for 90 days from the + result.

## 2020-06-22 NOTE — Progress Notes (Signed)
PA Alonza Bogus called back and + covid results relayed.

## 2020-06-23 ENCOUNTER — Telehealth: Payer: Self-pay | Admitting: Nurse Practitioner

## 2020-06-23 NOTE — Telephone Encounter (Signed)
Called to Discuss with patient about Covid symptoms and the use of the monoclonal antibody infusion for those with mild to moderate Covid symptoms and at a high risk of hospitalization.     Pt appears to qualify for this infusion due to co-morbid conditions and/or a member of an at-risk group in accordance with the FDA Emergency Use Authorization.    Unable to reach pt   Symptom onset: 06/21/2020 Vaccinated: Yes and boosted Qualified for Infusion: Yes (bmi 48, htn, dm)  Alda Lea, NP [facility]  [phone]

## 2020-06-23 NOTE — Telephone Encounter (Signed)
2nd ATTEMPT. I attempted to contact Ms. Knouff to obtain additional information and discuss possible treatment with EUA medications. I was unable to reach her via phone and left a voicemail with call back number.   Terri Piedra, NP 06/23/2020 2:21 PM

## 2020-06-24 ENCOUNTER — Encounter (HOSPITAL_COMMUNITY): Admission: RE | Payer: Self-pay | Source: Home / Self Care

## 2020-06-24 ENCOUNTER — Telehealth (HOSPITAL_COMMUNITY): Payer: Self-pay

## 2020-06-24 ENCOUNTER — Other Ambulatory Visit (HOSPITAL_COMMUNITY): Payer: Medicaid Other

## 2020-06-24 ENCOUNTER — Ambulatory Visit (HOSPITAL_COMMUNITY): Admission: RE | Admit: 2020-06-24 | Payer: Medicaid Other | Source: Home / Self Care | Admitting: Gastroenterology

## 2020-06-24 ENCOUNTER — Other Ambulatory Visit: Payer: Self-pay | Admitting: Physician Assistant

## 2020-06-24 ENCOUNTER — Telehealth: Payer: Self-pay

## 2020-06-24 DIAGNOSIS — E109 Type 1 diabetes mellitus without complications: Secondary | ICD-10-CM

## 2020-06-24 SURGERY — MANOMETRY, ESOPHAGUS

## 2020-06-24 MED ORDER — NIRMATRELVIR/RITONAVIR (PAXLOVID)TABLET: 3.0000 | ORAL_TABLET | Freq: Two times a day (BID) | ORAL | 0 refills | Status: AC

## 2020-06-24 MED FILL — PAXLOVID 20 X 150 MG & 10 X: 20 X 150 MG | 5 days supply | Qty: 30 | Fill #0

## 2020-06-24 NOTE — Telephone Encounter (Signed)
  Prescription Request  06/24/2020  What is the name of the medication or equipment? FREESTYLE LIBRE SENSORS, Pt also would like a prescription for a regular meter an Accu Chek Guide which is the one her insurance will pay for   Have you contacted your pharmacy to request a refill? (if applicable) YES, she was told she needs a prior [place].  Which pharmacy would you like this sent to? [facility], the sensor she has on right now ends in a day can she be given one to help her until her ins approves it? Pt stated [name] would do this for her     Patient notified that their request is being sent to the clinical staff for review and that they should receive a response within 2 business days.

## 2020-06-24 NOTE — Telephone Encounter (Signed)
PA in process for [facility] sensor  Confirmation # [id number]

## 2020-06-24 NOTE — Telephone Encounter (Signed)
Patient was prescribed oral covid treatment Paxlovid and treatment note was reviewed. Medication has been received by [place] and reviewed for appropriateness.  Drug Interactions or Dosage Adjustments Noted: hold symbicort and atorvastatin  Delivery Method: pick up  Patient contacted for counseling on 06/24/20 and verbalized understanding.   Delivery or Pick-Up Date: 06/24/20   Alinda Dooms 06/24/2020, 10:41 AM [facility] Pharmacist Phone# [phone]

## 2020-06-24 NOTE — Progress Notes (Addendum)
Outpatient Oral COVID Treatment Note  I connected with Judith [name] on 06/24/2020/10:08 AM by telephone and verified that I am speaking with the correct person using two identifiers.  I discussed the limitations, risks, security, and privacy concerns of performing an evaluation and management service by telephone and the availability of in person appointments. I also discussed with the patient that there may be a patient responsible charge related to this service. The patient expressed understanding and agreed to proceed.  Patient location: home  Provider location: office   Diagnosis: COVID-19 infection  Purpose of visit: Discussion of potential use of Molnupiravir or Paxlovid, a new treatment for mild to moderate COVID-19 viral infection in non-hospitalized patients.   Subjective: Patient is a 50 y.o. female who has been diagnosed with COVID 19 viral infection.  Their symptoms began on 2/4 with sinus congestion and sore throat and fatigue. Pt is fully vaccinated and boosted.    Past Medical History:  Diagnosis Date  . Asthma   . Bipolar 1 disorder ([place])   . Bronchitis, chronic ([place])   . Constipation   . Depression   . Dysphagia   . Gastroparesis   . GERD (gastroesophageal reflux disease)   . Hyperlipemia   . Hypothyroid   . Migraines     Allergies  Allergen Reactions  . Diphenhydramine Other (See Comments)    Hair feels like it is crawling  . Morphine Nausea And Vomiting    PCA PUMP N/V IV PUSH IN ER NO PROBLEM PER PT.  . Prednisone Nausea And Vomiting  . Adhesive [Tape]     Paper tape causes irritation   . Erythromycin Hives  . Metformin And Related     Diarrhea with IR and XR products  . Sulfonamide Derivatives Other (See Comments)    Does not take due to family history     Current Outpatient Medications:  .  Accu-Chek FastClix Lancets MISC, Test blood sugars daily Dx E11.9, Disp: 102 each, Rfl: 3 .  albuterol (PROVENTIL) (2.5 MG/3ML) 0.083% nebulizer solution,  Nebulize 1 vial three times daily, Disp: 150 mL, Rfl: 2 .  albuterol (PROVENTIL) (2.5 MG/3ML) 0.083% nebulizer solution, Take 3 mLs (2.5 mg total) by nebulization every 6 (six) hours as needed for wheezing or shortness of breath., Disp: 75 mL, Rfl: 12 .  albuterol (VENTOLIN HFA) 108 (90 Base) MCG/ACT inhaler, Inhale 2 puffs into the lungs every 6 (six) hours as needed for wheezing or shortness of breath., Disp: 8 g, Rfl: 1 .  atorvastatin (LIPITOR) 10 MG tablet, Take 1 tablet (10 mg total) by mouth daily., Disp: 90 tablet, Rfl: 3 .  blood glucose meter kit and supplies, Dispense based on patient and insurance preference. Check blood sugar bid and as needed. Dx: E11.9, Disp: 1 each, Rfl: 0 .  Continuous Blood Gluc Receiver (FREESTYLE LIBRE 2 READER) DEVI, USE TO SCAN BLOOD SUGAR AS DIRECTED UP TO 6 TIMES DAILY. DX: E11.9, Disp: 2 each, Rfl: 1 .  Continuous Blood Gluc Sensor (FREESTYLE LIBRE 2 SENSOR) MISC, Use to check blood sugar as directed up to 6 times daily. DX: E11.9. 2 sensors=30 day supply, 6 sensors=90 day supply, Disp: 6 each, Rfl: 4 .  cyclobenzaprine (FLEXERIL) 10 MG tablet, Take 1 tablet (10 mg total) by mouth 3 (three) times daily as needed for muscle spasms., Disp: 60 tablet, Rfl: 5 .  dexlansoprazole (DEXILANT) 60 MG capsule, Take 1 capsule (60 mg total) by mouth daily., Disp: 30 capsule, Rfl: 3 .  diclofenac  Sodium (VOLTAREN) 1 % GEL, Apply 4 g topically 4 (four) times daily. For knee pain, Disp: 400 g, Rfl: 2 .  dicyclomine (BENTYL) 20 MG tablet, Take 1 tablet (20 mg total) by mouth every 6 (six) hours., Disp: 60 tablet, Rfl: 5 .  glucose blood (ACCU-CHEK GUIDE) test strip, Use as instructed E11.9, Disp: 100 strip, Rfl: 12 .  HYDROcodone-acetaminophen (NORCO/VICODIN) 5-325 MG tablet, Take 1 tablet by mouth every 8 (eight) hours as needed., Disp: 15 tablet, Rfl: 0 .  hydrOXYzine (ATARAX/VISTARIL) 25 MG tablet, Take 1 tablet (25 mg total) by mouth 3 (three) times daily., Disp: 90 tablet,  Rfl: 5 .  ibuprofen (ADVIL) 600 MG tablet, Take 1 tablet (600 mg total) by mouth every 8 (eight) hours as needed., Disp: 60 tablet, Rfl: 1 .  insulin degludec (TRESIBA FLEXTOUCH) 200 UNIT/ML FlexTouch Pen, Inject 66 Units into the skin at bedtime., Disp: 15 mL, Rfl: 3 .  Insulin Pen Needle 31G X 5 MM MISC, 1 Units by Does not apply route daily., Disp: 100 each, Rfl: 5 .  lamoTRIgine (LAMICTAL) 150 MG tablet, Take 1 tablet (150 mg total) by mouth 2 (two) times daily., Disp: 60 tablet, Rfl: 2 .  levothyroxine (SYNTHROID) 125 MCG tablet, Take 1 tablet (125 mcg total) by mouth daily., Disp: 90 tablet, Rfl: 1 .  lidocaine (LIDODERM) 5 %, Place 1 patch onto the skin daily. Remove & Discard patch within 12 hours or as directed by MD, Disp: 30 patch, Rfl: 0 .  linaclotide (LINZESS) 72 MCG capsule, Take 1 capsule (72 mcg total) by mouth daily before breakfast., Disp: 30 capsule, Rfl: 2 .  lisdexamfetamine (VYVANSE) 70 MG capsule, Take 1 capsule (70 mg total) by mouth daily., Disp: 30 capsule, Rfl: 0 .  lisinopril (ZESTRIL) 10 MG tablet, Take 20 mg by mouth daily., Disp: , Rfl:  .  metoCLOPramide (REGLAN) 5 MG tablet, Take 1 tablet (5 mg total) by mouth 4 (four) times daily -  before meals and at bedtime., Disp: 120 tablet, Rfl: 2 .  montelukast (SINGULAIR) 10 MG tablet, TAKE ONE TAB BY MOUTH AT BEDTIME, Disp: 90 tablet, Rfl: 3 .  nystatin cream (MYCOSTATIN), Apply 1 application topically 2 (two) times daily. x2 weeks, Disp: 60 g, Rfl: 0 .  ondansetron (ZOFRAN) 4 MG tablet, Take 1 tablet (4 mg total) by mouth every 8 (eight) hours as needed for nausea or vomiting., Disp: 20 tablet, Rfl: 0 .  pioglitazone (ACTOS) 30 MG tablet, Take 1 tablet (30 mg total) by mouth daily., Disp: 90 tablet, Rfl: 3 .  promethazine (PHENERGAN) 25 MG tablet, Take 1 tablet (25 mg total) by mouth every 6 (six) hours as needed for nausea or vomiting., Disp: 30 tablet, Rfl: 0 .  promethazine-dextromethorphan (PROMETHAZINE-DM) 6.25-15  MG/5ML syrup, Take 2.5 mLs by mouth 4 (four) times daily as needed for cough., Disp: 118 mL, Rfl: 0 .  Semaglutide (RYBELSUS) 7 MG TABS, Take 7 mg by mouth daily. Start after 48m sample pack, Disp: 30 tablet, Rfl: 2 .  sertraline (ZOLOFT) 100 MG tablet, Take 2 tablets (200 mg total) by mouth daily., Disp: 180 tablet, Rfl: 3 .  SYMBICORT 160-4.5 MCG/ACT inhaler, TAKE 2 PUFFS BY MOUTH TWICE A DAY, Disp: 10.2 g, Rfl: 0 .  traMADol (ULTRAM) 50 MG tablet, Take 1 tablet (50 mg total) by mouth every 8 (eight) hours as needed for severe pain., Disp: 90 tablet, Rfl: 5  Objective: Patient sounds congested.  They are in no apparent distress.  Breathing is non labored.  Mood and behavior are normal.  Laboratory Data:  Recent Results (from the past 2160 hour(s))  Perennial allergen profile IgE     Status: None   Collection Time: 04/04/20 11:55 AM  Result Value Ref Range   Class Description Allergens Comment     Comment:     Levels of Specific IgE       Class  Description of Class     ---------------------------  -----  --------------------                    < 0.10         0         Negative            0.10 -    0.31         0/I       Equivocal/Low            0.32 -    0.55         I         Low            0.56 -    1.40         II        Moderate            1.41 -    3.90         III       High            3.91 -   19.00         IV        Very High           19.01 -  100.00         V         Very High                   >100.00         VI        Very High    D Pteronyssinus IgE <0.10 Class 0 kU/L   D Farinae IgE <0.10 Class 0 kU/L   Cat Dander IgE <0.10 Class 0 kU/L   Dog Dander IgE <0.10 Class 0 kU/L   Cow Dander IgE <0.10 Class 0 kU/L   Goose Feathers IgE <0.10 Class 0 kU/L   Chicken Feathers IgE <0.10 Class 0 kU/L   Duck Feathers IgE <0.10 Class 0 kU/L   Penicillium Chrysogen IgE <0.10 Class 0 kU/L   Cladosporium Herbarum IgE <0.10 Class 0 kU/L   Aspergillus Fumigatus IgE <0.10 Class 0 kU/L    Mucor Racemosus IgE <0.10 Class 0 kU/L   Candida Albicans IgE <0.10 Class 0 kU/L   Alternaria Alternata IgE <0.10 Class 0 kU/L   Setomelanomma Rostrat <0.10 Class 0 kU/L   Aureobasidi Pullulans IgE <0.10 Class 0 kU/L   Phoma Betae IgE <0.10 Class 0 kU/L   Stemphylium Herbarum IgE <0.10 Class 0 kU/L   Mouse Urine IgE <0.10 Class 0 kU/L  IgE     Status: None   Collection Time: 04/04/20 11:55 AM  Result Value Ref Range   IgE (Immunoglobulin E), Serum 44 <OR=114 kU/L  CBC w/Diff     Status: Abnormal   Collection Time: 04/04/20 11:55 AM  Result Value Ref Range   WBC 7.7 4.0 - 10.5 K/uL   RBC 5.17 (H) 3.87 - 5.11 Mil/uL  Hemoglobin 13.7 12.0 - 15.0 g/dL   HCT 41.9 36.0 - 46.0 %   MCV 80.9 78.0 - 100.0 fl   MCHC 32.7 30.0 - 36.0 g/dL   RDW 13.8 11.5 - 15.5 %   Platelets 305.0 150.0 - 400.0 K/uL   Neutrophils Relative % 60.7 43.0 - 77.0 %   Lymphocytes Relative 28.1 12.0 - 46.0 %   Monocytes Relative 8.9 3.0 - 12.0 %   Eosinophils Relative 1.6 0.0 - 5.0 %   Basophils Relative 0.7 0.0 - 3.0 %   Neutro Abs 4.7 1.4 - 7.7 K/uL   Lymphs Abs 2.2 0.7 - 4.0 K/uL   Monocytes Absolute 0.7 0.1 - 1.0 K/uL   Eosinophils Absolute 0.1 0.0 - 0.7 K/uL   Basophils Absolute 0.1 0.0 - 0.1 K/uL  SARS CORONAVIRUS 2 (TAT 6-24 HRS) Nasopharyngeal Nasopharyngeal Swab     Status: None   Collection Time: 05/31/20  2:29 PM   Specimen: Nasopharyngeal Swab  Result Value Ref Range   SARS Coronavirus 2 NEGATIVE NEGATIVE    Comment: (NOTE) SARS-CoV-2 target nucleic acids are NOT DETECTED.  The SARS-CoV-2 RNA is generally detectable in upper and lower respiratory specimens during the acute phase of infection. Negative results do not preclude SARS-CoV-2 infection, do not rule out co-infections with other pathogens, and should not be used as the sole basis for treatment or other patient management decisions. Negative results must be combined with clinical observations, patient history, and epidemiological  information. The expected result is Negative.  Fact Sheet for Patients: SugarRoll.be  Fact Sheet for Healthcare Providers: https://www.woods-mathews.com/  This test is not yet approved or cleared by the [place] FDA and  has been authorized for detection and/or diagnosis of SARS-CoV-2 by FDA under an Emergency Use Authorization (EUA). This EUA will remain  in effect (meaning this test can be used) for the duration of the COVID-19 declaration under Se ction 564(b)(1) of the Act, 21 U.S.C. section 360bbb-3(b)(1), unless the authorization is terminated or revoked sooner.  Performed at [facility], [place] [address] [place], [place] [address]   COVID-19, Flu A+B ([facility])     Status: None   Collection Time: 06/11/20  6:44 PM   Specimen: Nasopharyngeal   Naso  Result Value Ref Range   SARS-CoV-2, NAA Not Detected Not Detected    Comment: This nucleic acid amplification test was developed and its performance characteristics determined by [employer]ect[employer], Dickinson and Company. Nucleic acid amplification tests include RT-PCR and TMA. This test has not been FDA cleared or approved. This test has been authorized by FDA under an Emergency Use Authorization (EUA). This test is only authorized for the duration of time the declaration that circumstances exist justifying the authorization of the emergency use of in vitro diagnostic tests for detection of SARS-CoV-2 virus and/or diagnosis of COVID-19 infection under section 564(b)(1) of the Act, 21 U.S.C. 509TOI-7(T) (1), unless the authorization is terminated or revoked sooner. When diagnostic testing is negative, the possibility of a false negative result should be considered in the context of a patient's recent exposures and the presence of clinical signs and symptoms consistent with COVID-19. An individual without symptoms of COVID-19 and who is not shedding SARS-CoV-2 virus wo uld expect to have  a negative (not detected) result in this assay.    Influenza A, NAA Not Detected Not Detected   Influenza B, NAA Not Detected Not Detected  SARS CORONAVIRUS 2 (TAT 6-24 HRS) Nasopharyngeal Nasopharyngeal Swab     Status: Abnormal  Collection Time: 06/21/20  2:56 PM   Specimen: Nasopharyngeal Swab  Result Value Ref Range   SARS Coronavirus 2 POSITIVE (A) NEGATIVE    Comment: (NOTE) SARS-CoV-2 target nucleic acids are DETECTED.  The SARS-CoV-2 RNA is generally detectable in upper and lower respiratory specimens during the acute phase of infection. Positive results are indicative of the presence of SARS-CoV-2 RNA. Clinical correlation with patient history and other diagnostic information is  necessary to determine patient infection status. Positive results do not rule out bacterial infection or co-infection with other viruses.  The expected result is Negative.  Fact Sheet for Patients: SugarRoll.be  Fact Sheet for Healthcare Providers: https://www.woods-mathews.com/  This test is not yet approved or cleared by the [place] FDA and  has been authorized for detection and/or diagnosis of SARS-CoV-2 by FDA under an Emergency Use Authorization (EUA). This EUA will remain  in effect (meaning this test can be used) for the duration of the COVID-19 declaration under Section 564(b)(1) of the Act, 21 U. S.C. section 360bbb-3(b)(1), unless the authorization is terminated or revoked sooner.   Performed at [facility], [place] [address] [place], [place] [address]      Assessment: 50 y.o. female with mild/moderate COVID 19 viral infection diagnosed on 2/4 at high risk for progression to severe COVID 19.  Plan:  This patient is a 50 y.o. female that meets the following criteria for Emergency Use Authorization of: Paxlovid 1. Age >12 yr AND > 40 kg 2. SARS-COV-2 positive test 3. Symptom onset < 5 days 4. Mild-to-moderate COVID disease  with high risk for severe progression to hospitalization or death  I have spoken and communicated the following to the patient or parent/caregiver regarding: 1. Paxlovid is an unapproved drug that is authorized for use under an Emergency Use Authorization.  2. There are no adequate, approved, available products for the treatment of COVID-19 in adults who have mild-to-moderate COVID-19 and are at high risk for progressing to severe COVID-19, including hospitalization or death. 3. Other therapeutics are currently authorized. For additional information on all products authorized for treatment or prevention of COVID-19, please see TanEmporium.pl.  4. There are benefits and risks of taking this treatment as outlined in the "Fact Sheet for Patients and Caregivers."  5. "Fact Sheet for Patients and Caregivers" was reviewed with patient. A hard copy will be provided to patient from pharmacy prior to the patient receiving treatment. 6. Patients should continue to self-isolate and use infection control measures (e.g., wear mask, isolate, social distance, avoid sharing personal items, clean and disinfect "high touch" surfaces, and frequent handwashing) according to CDC guidelines.  7. The patient or parent/caregiver has the option to accept or refuse treatment. 8. Patient medication history was reviewed for potential drug interactions:Interaction with home meds: Symbicort 9. [name] GFR was calculated to be >60, and they were therefore prescribed Normal dose (GFR>60) - nirmatrelvir 188m tab (2 tablet) by mouth twice daily AND ritonavir 1034mtab (1 tablet) by mouth twice daily Last CMET in 02/2020 but has established normal renal function. Not on lisinopril any longer and eating and drinking normally. No need to repeat labs at this time.   After reviewing above information with the patient, the patient  agrees to receive Paxlovid.  Follow up instructions:    . Take prescription BID x 5 days as directed . Reach out to pharmacist for counseling on medication if desired . For concerns regarding further COVID symptoms please follow up with your PCP or urgent  care . For urgent or life-threatening issues, seek care at your local emergency department  The patient was provided an opportunity to ask questions, and all were answered. The patient agreed with the plan and demonstrated an understanding of the instructions.   Script sent to [facility] and opted to pick up RX.  The patient was advised to call their PCP or seek an in-person evaluation if the symptoms worsen or if the condition fails to improve as anticipated.   I provided 15 minutes of non face-to-face telephone visit time during this encounter, and > 50% was spent counseling as documented under my assessment & plan.  Angelena Form, PA-C 06/24/2020 /10:08 AM

## 2020-06-24 NOTE — Telephone Encounter (Signed)
Third attempt to reach pt. Pt called our hotline back and left a VM. I returned call. Phone went straight to VM. Left a message.   Angelena Form PA-C  [facility]

## 2020-06-24 NOTE — Telephone Encounter (Signed)
Patient has been rescheduled for esophageal manometry at [facility] on Monday, 07/08/20 at 10:30 AM. Will send updated instructions to patient via My Chart.

## 2020-06-25 MED ORDER — ACCU-CHEK GUIDE ME W/DEVICE KIT: PACK | 0 refills | Status: DC

## 2020-06-25 MED ORDER — ACCU-CHEK GUIDE VI STRP: ORAL_STRIP | 12 refills | Status: DC

## 2020-06-25 MED ORDER — BLOOD GLUCOSE METER KIT: PACK | 0 refills | Status: DC

## 2020-06-25 MED ORDER — ACCU-CHEK FASTCLIX LANCETS MISC: 3 refills | Status: DC

## 2020-06-25 NOTE — Telephone Encounter (Signed)
Rx for meter and test strips sent to pharmacy. PA for [facility] sensor denied. Patient aware.

## 2020-06-25 NOTE — Addendum Note (Signed)
Addended by: Thana Ates on: 06/25/2020 10:48 AM   Modules accepted: Orders

## 2020-06-26 ENCOUNTER — Encounter: Payer: Medicaid Other | Admitting: Physical Therapy

## 2020-06-27 ENCOUNTER — Telehealth: Payer: Medicaid Other | Admitting: Pulmonary Disease

## 2020-06-27 ENCOUNTER — Ambulatory Visit: Payer: Medicaid Other | Admitting: Adult Health

## 2020-06-27 ENCOUNTER — Ambulatory Visit: Payer: Medicaid Other

## 2020-06-27 DIAGNOSIS — J4540 Moderate persistent asthma, uncomplicated: Secondary | ICD-10-CM

## 2020-06-27 NOTE — Telephone Encounter (Signed)
Mepolizumab. She is a candidate for home injections. Thanks for your help!

## 2020-06-27 NOTE — Telephone Encounter (Signed)
Routed to pharmacy team 

## 2020-06-27 NOTE — Telephone Encounter (Signed)
Call returned to patient, confirmed DOB. Patient declined to make an appt at this time stating she will call back to make the appt. She wanted to check the status of her biologic.   I see in the documentation following her lab work MD recommended biologic injection.   Will route message to injection pool to see if this process has been started.

## 2020-06-27 NOTE — Telephone Encounter (Signed)
Pharmacy team had not received any new start paperwork/notification for biologic new start. Please advise what medication/dose and we will start the benefits process.   Also, will patient be a candidate for self-injection?  Thanks!

## 2020-06-28 ENCOUNTER — Encounter: Payer: Medicaid Other | Admitting: *Deleted

## 2020-06-28 NOTE — Telephone Encounter (Addendum)
Dr. Silas Flood - Medicaid will not approve Nucala for this patient unless there is an FEV1pre <80% from a PFT that is submitted. Because she has to wait 90 days from 06/21/20 to have a PFT completed, it is optimal to wait until this results to submit a Nucala prior auth. Medicaid has the most stringent criteria and will deny our auth from the get-go without the FEV1 value (there is none on file to submit from the past either). I advised the patient about this process and any appeal we submit will also be denied without an FEV1 on file. She understood and agreed with this plan however -  She'd like to further discuss other strategies with you given her breathing status.  Her COVID symptoms (breathing specifically) has improved since being diagnosed but worse since baseline. She said you could send her a MyChart message if you'd like to advise or otherwise  Medicaid criteria for Nucala on pages 4-5: [email or web address]  Knox Saliva, PharmD, MPH Clinical Pharmacist (Rheumatology and Pulmonology)

## 2020-07-01 ENCOUNTER — Other Ambulatory Visit: Payer: Self-pay | Admitting: *Deleted

## 2020-07-01 ENCOUNTER — Encounter: Payer: Medicaid Other | Admitting: Physical Therapy

## 2020-07-01 NOTE — Progress Notes (Signed)
Form from Insurance states that 2022 - plan Denied coverage for [facility] reader and sensor.  Pt picked up regular machine for sugar check on 06-06-20.

## 2020-07-03 ENCOUNTER — Telehealth: Payer: Self-pay

## 2020-07-03 ENCOUNTER — Ambulatory Visit: Payer: Medicaid Other | Admitting: Physical Therapy

## 2020-07-03 ENCOUNTER — Telehealth: Payer: Self-pay | Admitting: Family Medicine

## 2020-07-03 ENCOUNTER — Other Ambulatory Visit: Payer: Self-pay

## 2020-07-03 ENCOUNTER — Encounter: Payer: Self-pay | Admitting: Physical Therapy

## 2020-07-03 DIAGNOSIS — M25562 Pain in left knee: Secondary | ICD-10-CM

## 2020-07-03 DIAGNOSIS — G8929 Other chronic pain: Secondary | ICD-10-CM

## 2020-07-03 DIAGNOSIS — M6281 Muscle weakness (generalized): Secondary | ICD-10-CM

## 2020-07-03 DIAGNOSIS — R262 Difficulty in walking, not elsewhere classified: Secondary | ICD-10-CM
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
## 2020-07-03 NOTE — Telephone Encounter (Signed)
Abby frm [employer] review dept [phone] cld to see if provider had any addt'l information or medical information to add to pt's disability review determination hearing .please call them @ listed   --forwarding note to Dr. Raeford Razor . --glh

## 2020-07-03 NOTE — Telephone Encounter (Signed)
Dr. Havery Moros, I received a call from [place] at Dr. Bud Face office. She states that this is in regards to an optional peer to peer for patient's disability, she states that you are able to add any further information if you need to, she did not state any further. Abby provided me with the contact number [phone].

## 2020-07-03 NOTE — Telephone Encounter (Signed)
Calling to let Dr Darnell Level know that if anything needs to be added to disability claim, she can call them back

## 2020-07-04 ENCOUNTER — Encounter: Payer: Medicaid Other | Admitting: Physical Therapy

## 2020-07-04 ENCOUNTER — Other Ambulatory Visit: Payer: Medicaid Other

## 2020-07-04 DIAGNOSIS — E039 Hypothyroidism, unspecified: Secondary | ICD-10-CM | POA: Diagnosis not present

## 2020-07-04 DIAGNOSIS — E119 Type 2 diabetes mellitus without complications: Secondary | ICD-10-CM | POA: Diagnosis not present

## 2020-07-04 LAB — BAYER DCA HB A1C WAIVED: HB A1C (BAYER DCA - WAIVED): 7.4 % — ABNORMAL HIGH (ref <7.0)

## 2020-07-05 ENCOUNTER — Other Ambulatory Visit: Payer: Self-pay | Admitting: *Deleted

## 2020-07-05 ENCOUNTER — Telehealth: Payer: Self-pay | Admitting: Family Medicine

## 2020-07-05 DIAGNOSIS — E785 Hyperlipidemia, unspecified: Secondary | ICD-10-CM

## 2020-07-05 LAB — LIPID PANEL
Chol/HDL Ratio: 10.9 ratio — ABNORMAL HIGH (ref 0.0–4.4)
Cholesterol, Total: 250 mg/dL — ABNORMAL HIGH (ref 100–199)
HDL: 23 mg/dL — ABNORMAL LOW (ref >39)
LDL Chol Calc (NIH): 172 mg/dL — ABNORMAL HIGH (ref 0–99)
Triglycerides: 284 mg/dL — ABNORMAL HIGH (ref 0–149)
VLDL Cholesterol Cal: 55 mg/dL — ABNORMAL HIGH (ref 5–40)

## 2020-07-05 LAB — CMP14+EGFR
ALT: 29 IU/L (ref 0–32)
AST: 20 IU/L (ref 0–40)
Albumin/Globulin Ratio: 1.5 (ref 1.2–2.2)
Albumin: 3.9 g/dL (ref 3.8–4.8)
Alkaline Phosphatase: 110 IU/L (ref 44–121)
BUN/Creatinine Ratio: 23 (ref 9–23)
BUN: 19 mg/dL (ref 6–24)
Bilirubin Total: 0.3 mg/dL (ref 0.0–1.2)
CO2: 20 mmol/L (ref 20–29)
Calcium: 9.1 mg/dL (ref 8.7–10.2)
Chloride: 103 mmol/L (ref 96–106)
Creatinine, Ser: 0.84 mg/dL (ref 0.57–1.00)
GFR calc Af Amer: 94 mL/min/{1.73_m2} (ref >59)
GFR calc non Af Amer: 81 mL/min/{1.73_m2} (ref >59)
Globulin, Total: 2.6 g/dL (ref 1.5–4.5)
Glucose: 183 mg/dL — ABNORMAL HIGH (ref 65–99)
Potassium: 4.2 mmol/L (ref 3.5–5.2)
Sodium: 139 mmol/L (ref 134–144)
Total Protein: 6.5 g/dL (ref 6.0–8.5)

## 2020-07-05 LAB — THYROID PANEL WITH TSH
Free Thyroxine Index: 2.1 (ref 1.2–4.9)
T3 Uptake Ratio: 25 % (ref 24–39)
T4, Total: 8.5 ug/dL (ref 4.5–12.0)
TSH: 0.918 u[IU]/mL (ref 0.450–4.500)

## 2020-07-05 MED ORDER — ATORVASTATIN CALCIUM 40 MG PO TABS: 40.0000 mg | ORAL_TABLET | Freq: Every day | ORAL | 0 refills | Status: DC

## 2020-07-05 NOTE — Telephone Encounter (Signed)
Pt calling to let Abigail Butts know that she had her labs done yesterday and to move forward with her prior authorization

## 2020-07-06 ENCOUNTER — Other Ambulatory Visit: Payer: Self-pay

## 2020-07-06 ENCOUNTER — Ambulatory Visit (INDEPENDENT_AMBULATORY_CARE_PROVIDER_SITE_OTHER): Payer: Medicaid Other

## 2020-07-06 DIAGNOSIS — M542 Cervicalgia: Secondary | ICD-10-CM

## 2020-07-06 DIAGNOSIS — M47812 Spondylosis without myelopathy or radiculopathy, cervical region: Secondary | ICD-10-CM

## 2020-07-08 ENCOUNTER — Telehealth: Payer: Self-pay

## 2020-07-08 ENCOUNTER — Ambulatory Visit (HOSPITAL_COMMUNITY)
Admission: RE | Admit: 2020-07-08 | Discharge: 2020-07-08 | Disposition: A | Discharge status: Home / Self Care | Payer: Medicaid Other | Attending: Gastroenterology | Admitting: Gastroenterology

## 2020-07-08 ENCOUNTER — Other Ambulatory Visit: Payer: Self-pay | Admitting: Family Medicine

## 2020-07-08 ENCOUNTER — Encounter (HOSPITAL_COMMUNITY)
Admission: RE | Disposition: A | Discharge status: Home / Self Care | Payer: Self-pay | Source: Home / Self Care | Attending: Gastroenterology

## 2020-07-08 ENCOUNTER — Encounter: Payer: Self-pay | Admitting: Family Medicine

## 2020-07-08 DIAGNOSIS — R1310 Dysphagia, unspecified: Secondary | ICD-10-CM | POA: Diagnosis not present

## 2020-07-08 DIAGNOSIS — E1165 Type 2 diabetes mellitus with hyperglycemia: Secondary | ICD-10-CM

## 2020-07-08 DIAGNOSIS — R1319 Other dysphagia: Secondary | ICD-10-CM

## 2020-07-08 DIAGNOSIS — K222 Esophageal obstruction: Secondary | ICD-10-CM | POA: Diagnosis not present

## 2020-07-08 HISTORY — PX: ESOPHAGEAL MANOMETRY: SHX5429

## 2020-07-08 SURGERY — MANOMETRY, ESOPHAGUS

## 2020-07-08 MED ORDER — FREESTYLE LIBRE 14 DAY SENSOR MISC: 1.0000 [IU] | Freq: Every day | 12 refills | Status: DC

## 2020-07-08 MED ORDER — FREESTYLE LIBRE READER DEVI: 1.0000 [IU] | Freq: Every day | 1 refills | Status: DC

## 2020-07-08 MED ORDER — LIDOCAINE VISCOUS HCL 2 % MT SOLN
OROMUCOSAL | Status: AC
  Filled 2020-07-08: qty 15

## 2020-07-08 SURGICAL SUPPLY — 2 items
FACESHIELD LNG OPTICON STERILE (SAFETY) IMPLANT
GLOVE BIO SURGEON STRL SZ8 (GLOVE) ×4 IMPLANT

## 2020-07-08 NOTE — Telephone Encounter (Signed)
PA resubmitted through [facility] for [facility] sensor Confirmation: [id number] W

## 2020-07-08 NOTE — Telephone Encounter (Signed)
A lady named Lajean Saver called stating that she/company she works for had been talking with Dr Lajuana Ripple about when a good time would be for Dr Lajuana Ripple to reach out to them regarding a disability claim for patient and Dr Lajuana Ripple said she would be available at 7:30.Lajean Saver said that Dr Clare Gandy lives in a different time zone, so that would be like 4:30 AM for him.  Wants to see if Dr Lajuana Ripple would be available at a different time.  Can call [phone] for more info.

## 2020-07-08 NOTE — Telephone Encounter (Signed)
Spoke with [place].  Attempted to reach Dr Nadara Mustard but he was not available.  Unfortunately, my schedule is quite packed with patients and other patient care responsibilities.  I've offered to speak to MD prior to normal business hours but this does not seem feasible.  I've got a meeting tomorrow.  I'll try my best to coordinate schedules but it would be very helpful if I could get a list of questions and simply reply to [facility] or an extender that is available during my availability.

## 2020-07-08 NOTE — Progress Notes (Signed)
Esophageal Manometry done per protocol. Pt tolerated fair at first and had trouble not gagging after correct placement confirmed. However, patient was able to push forward and have the study done. No complications or distress noted

## 2020-07-09 NOTE — Telephone Encounter (Signed)
APPROVED 07/08/2020 - 07/03/2021.   Patient and pharmacy aware.

## 2020-07-09 NOTE — Telephone Encounter (Signed)
Attempted to reach again but he was not available.  Will try and accommodate him but it would be very helpful to know what specific questions I can answer as our schedules seem not to be working out given the totally different time zones.

## 2020-07-10 ENCOUNTER — Ambulatory Visit (INDEPENDENT_AMBULATORY_CARE_PROVIDER_SITE_OTHER): Payer: Medicaid Other | Admitting: Family Medicine

## 2020-07-10 ENCOUNTER — Other Ambulatory Visit: Payer: Self-pay

## 2020-07-10 ENCOUNTER — Telehealth: Payer: Self-pay | Admitting: *Deleted

## 2020-07-10 ENCOUNTER — Ambulatory Visit: Payer: Medicaid Other | Admitting: Family Medicine

## 2020-07-10 ENCOUNTER — Encounter: Payer: Medicaid Other | Admitting: Physical Therapy

## 2020-07-10 ENCOUNTER — Encounter (HOSPITAL_COMMUNITY): Payer: Self-pay | Admitting: Gastroenterology

## 2020-07-10 VITALS — BP 127/79 | HR 97 | Temp 97.0°F | Ht 68.0 in | Wt 306.8 lb

## 2020-07-10 DIAGNOSIS — M25561 Pain in right knee: Secondary | ICD-10-CM

## 2020-07-10 DIAGNOSIS — F988 Other specified behavioral and emotional disorders with onset usually occurring in childhood and adolescence: Secondary | ICD-10-CM | POA: Diagnosis not present

## 2020-07-10 DIAGNOSIS — M51369 Other intervertebral disc degeneration, lumbar region without mention of lumbar back pain or lower extremity pain: Secondary | ICD-10-CM

## 2020-07-10 DIAGNOSIS — J4540 Moderate persistent asthma, uncomplicated: Secondary | ICD-10-CM

## 2020-07-10 DIAGNOSIS — M25562 Pain in left knee: Secondary | ICD-10-CM

## 2020-07-10 DIAGNOSIS — E1165 Type 2 diabetes mellitus with hyperglycemia: Secondary | ICD-10-CM | POA: Diagnosis not present

## 2020-07-10 DIAGNOSIS — R109 Unspecified abdominal pain: Secondary | ICD-10-CM

## 2020-07-10 DIAGNOSIS — Z794 Long term (current) use of insulin: Secondary | ICD-10-CM | POA: Diagnosis not present

## 2020-07-10 DIAGNOSIS — M5136 Other intervertebral disc degeneration, lumbar region: Secondary | ICD-10-CM

## 2020-07-10 DIAGNOSIS — G8929 Other chronic pain: Secondary | ICD-10-CM | POA: Diagnosis not present

## 2020-07-10 DIAGNOSIS — F39 Unspecified mood [affective] disorder: Secondary | ICD-10-CM

## 2020-07-10 DIAGNOSIS — E039 Hypothyroidism, unspecified: Secondary | ICD-10-CM | POA: Diagnosis not present

## 2020-07-10 MED ORDER — MONTELUKAST SODIUM 10 MG PO TABS: ORAL_TABLET | ORAL | 3 refills | Status: DC

## 2020-07-10 MED ORDER — LEVOTHYROXINE SODIUM 125 MCG PO TABS: 125.0000 ug | ORAL_TABLET | Freq: Every day | ORAL | 1 refills | Status: DC

## 2020-07-10 MED ORDER — RYBELSUS 14 MG PO TABS: 1.0000 | ORAL_TABLET | Freq: Every day | ORAL | 99 refills | Status: DC

## 2020-07-10 MED ORDER — HYDROXYZINE HCL 25 MG PO TABS: 25.0000 mg | ORAL_TABLET | Freq: Three times a day (TID) | ORAL | 5 refills | Status: DC

## 2020-07-10 MED ORDER — SERTRALINE HCL 100 MG PO TABS: 200.0000 mg | ORAL_TABLET | Freq: Every day | ORAL | 3 refills | Status: DC

## 2020-07-10 MED ORDER — MEDROXYPROGESTERONE ACETATE 150 MG/ML IM SUSP: 150.0000 mg | INTRAMUSCULAR | 3 refills | Status: DC

## 2020-07-10 MED ORDER — LISDEXAMFETAMINE DIMESYLATE 70 MG PO CAPS: 70.0000 mg | ORAL_CAPSULE | Freq: Every day | ORAL | 0 refills | Status: DC

## 2020-07-10 NOTE — Progress Notes (Signed)
Subjective: CC: Multiple concerns PCP: Janora Norlander, DO HPI:Judith [name] is a 50 y.o. female presenting to clinic today for:  1. Type 2 Diabetes with hypertension, hyperlipidemia:  Successfully got her freestyle libre.  She is using it to monitor her blood sugars closely.  She is compliant with Rybelsus 7 mg daily, Actos 30 mg daily and her insulin.  No hypoglycemic episodes and in fact her blood sugars have not really gone below 150.  She would like to advance her regimen as A1c was uncontrolled.  She is continued on her Lipitor $RemoveBef'40mg'ZjEQrOYKKI$ .  No longer on lisinopril She is not had any issues with the Rybelsus and is happy about the weight loss that she has had thus far.  She is down about 10 pounds  Last eye exam: Needs Last foot exam: Needs Last A1c:  Lab Results  Component Value Date   HGBA1C 7.4 (H) 07/04/2020   Nephropathy screen indicated?:  Needs Last flu, zoster and/or pneumovax:  Immunization History  Administered Date(s) Administered  . Influenza, Seasonal, Injecte, Preservative Fre 02/09/2018  . Influenza,inj,Quad PF,6+ Mos 02/14/2017, 02/20/2019  . Influenza-Unspecified 02/05/2020  . MMR 02/03/1972  . Moderna Sars-Covid-2 Vaccination 10/02/2019, 11/01/2019, 04/18/2020  . Pneumococcal Polysaccharide-23 02/20/2019, 11/01/2019  . Td 03/03/1985, 06/30/1994  . Tdap 12/24/2014, 08/15/2019  . Zoster Recombinat (Shingrix) 02/05/2020    ROS: No chest pain.  Shortness of breath is stable.  She has known asthma.  2.  Degenerative cervical spine/ chronic knee pain Had had an MRI recently of C-spine which noted degenerative changes at the C5-C6 level. She is currently pursuing long-term disability due to. Orthopedic issues including C-spine, knee issues. She continues to have chronic knee pain and is currently in physical therapy for this. She does report some improvement in her knee pain but notes that she still has little endurance and often will feel that her knees buckled  when she ambulates or stands for too long. She continues to use her cane intermittently. She has follow-up with Dr. Raeford Razor tomorrow. She suspects that he will likely refer her to a neurosurgeon for her neck  3. Chronic abdominal pain/ dysphagia She will be undergoing manometry study on Monday. She has persistent globus sensation and continues to have issues with swallowing. She often will choke on solids and liquids even if the solids are cut up into small pieces. She has had history of similar issues which required Botox injection and esophageal stretch. She does not report significant chronic abdominal pain today and in fact has been tolerating the Rybelsus as above without difficulty  4. IUD removal Patient reports that she had her IUD removed recently. She is now on the Depo shot. She had a light period but has so far been tolerating the Depo shot without difficulty. Asking for refills of this so that she can have this done at our office now instead of the health department  5. Mood disorder/ADHD Patient is compliant with her medications but notes that she is been having exacerbation difficulty focusing. She was using Vyvanse previously when she was working and had discontinued since she was not working but now she is trying to focus on activities again is having difficulties doing so. Asking to get back on the Vyvanse. Also needs refills of her Zoloft 200 mg and ATarax.  6. Hypothyroidism, acquired Patient is compliant with thyroid replacement medication. Her thyroid panel was normal. Needs refills of her thyroid medication  ROS: Per HPI  Allergies  Allergen Reactions  .  Diphenhydramine Other (See Comments)    Hair feels like it is crawling  . Morphine Nausea And Vomiting    PCA PUMP N/V IV PUSH IN ER NO PROBLEM PER PT.  . Prednisone Nausea And Vomiting  . Adhesive [Tape]     Paper tape causes irritation   . Erythromycin Hives  . Metformin And Related     Diarrhea with IR and XR  products  . Sulfonamide Derivatives Other (See Comments)    Does not take due to family history   Past Medical History:  Diagnosis Date  . Asthma   . Bipolar 1 disorder (HCC)   . Bronchitis, chronic (HCC)   . Constipation   . Depression   . Dysphagia   . Gastroparesis   . GERD (gastroesophageal reflux disease)   . Hyperlipemia   . Hypothyroid   . Migraines     Current Outpatient Medications:  .  Accu-Chek FastClix Lancets MISC, Test blood sugars daily Dx E11.9, Disp: 102 each, Rfl: 3 .  albuterol (PROVENTIL) (2.5 MG/3ML) 0.083% nebulizer solution, Nebulize 1 vial three times daily, Disp: 150 mL, Rfl: 2 .  albuterol (PROVENTIL) (2.5 MG/3ML) 0.083% nebulizer solution, Take 3 mLs (2.5 mg total) by nebulization every 6 (six) hours as needed for wheezing or shortness of breath., Disp: 75 mL, Rfl: 12 .  albuterol (VENTOLIN HFA) 108 (90 Base) MCG/ACT inhaler, Inhale 2 puffs into the lungs every 6 (six) hours as needed for wheezing or shortness of breath., Disp: 8 g, Rfl: 1 .  atorvastatin (LIPITOR) 10 MG tablet, Take 1 tablet (10 mg total) by mouth daily., Disp: 90 tablet, Rfl: 3 .  atorvastatin (LIPITOR) 40 MG tablet, Take 1 tablet (40 mg total) by mouth daily., Disp: 90 tablet, Rfl: 0 .  Blood Glucose Monitoring Suppl (ACCU-CHEK GUIDE ME) w/Device KIT, Use to check BS BID and as needed. DX: E11.9, Disp: 1 kit, Rfl: 0 .  Continuous Blood Gluc Receiver (FREESTYLE LIBRE READER) DEVI, 1 Units by Does not apply route daily. UAD to test BGs daily. Dx E11.40, Disp: 1 each, Rfl: 1 .  Continuous Blood Gluc Sensor (FREESTYLE LIBRE 14 DAY SENSOR) MISC, 1 Units by Does not apply route daily. UAD to check blood sugars R73.09, Disp: 2 each, Rfl: 12 .  cyclobenzaprine (FLEXERIL) 10 MG tablet, Take 1 tablet (10 mg total) by mouth 3 (three) times daily as needed for muscle spasms., Disp: 60 tablet, Rfl: 5 .  dexlansoprazole (DEXILANT) 60 MG capsule, Take 1 capsule (60 mg total) by mouth daily., Disp: 30  capsule, Rfl: 3 .  diclofenac Sodium (VOLTAREN) 1 % GEL, Apply 4 g topically 4 (four) times daily. For knee pain, Disp: 400 g, Rfl: 2 .  dicyclomine (BENTYL) 20 MG tablet, Take 1 tablet (20 mg total) by mouth every 6 (six) hours., Disp: 60 tablet, Rfl: 5 .  glucose blood (ACCU-CHEK GUIDE) test strip, Use as instructed E11.9, Disp: 100 strip, Rfl: 12 .  HYDROcodone-acetaminophen (NORCO/VICODIN) 5-325 MG tablet, Take 1 tablet by mouth every 8 (eight) hours as needed., Disp: 15 tablet, Rfl: 0 .  hydrOXYzine (ATARAX/VISTARIL) 25 MG tablet, Take 1 tablet (25 mg total) by mouth 3 (three) times daily., Disp: 90 tablet, Rfl: 5 .  ibuprofen (ADVIL) 600 MG tablet, Take 1 tablet (600 mg total) by mouth every 8 (eight) hours as needed., Disp: 60 tablet, Rfl: 1 .  insulin degludec (TRESIBA FLEXTOUCH) 200 UNIT/ML FlexTouch Pen, Inject 66 Units into the skin at bedtime., Disp: 15 mL, Rfl:  3 .  Insulin Pen Needle 31G X 5 MM MISC, 1 Units by Does not apply route daily., Disp: 100 each, Rfl: 5 .  lamoTRIgine (LAMICTAL) 150 MG tablet, Take 1 tablet (150 mg total) by mouth 2 (two) times daily., Disp: 60 tablet, Rfl: 2 .  levothyroxine (SYNTHROID) 125 MCG tablet, Take 1 tablet (125 mcg total) by mouth daily., Disp: 90 tablet, Rfl: 1 .  lidocaine (LIDODERM) 5 %, Place 1 patch onto the skin daily. Remove & Discard patch within 12 hours or as directed by MD, Disp: 30 patch, Rfl: 0 .  linaclotide (LINZESS) 72 MCG capsule, Take 1 capsule (72 mcg total) by mouth daily before breakfast., Disp: 30 capsule, Rfl: 2 .  lisdexamfetamine (VYVANSE) 70 MG capsule, Take 1 capsule (70 mg total) by mouth daily., Disp: 30 capsule, Rfl: 0 .  lisinopril (ZESTRIL) 10 MG tablet, Take 20 mg by mouth daily., Disp: , Rfl:  .  metoCLOPramide (REGLAN) 5 MG tablet, Take 1 tablet (5 mg total) by mouth 4 (four) times daily -  before meals and at bedtime., Disp: 120 tablet, Rfl: 2 .  montelukast (SINGULAIR) 10 MG tablet, TAKE ONE TAB BY MOUTH AT  BEDTIME, Disp: 90 tablet, Rfl: 3 .  nystatin cream (MYCOSTATIN), Apply 1 application topically 2 (two) times daily. x2 weeks, Disp: 60 g, Rfl: 0 .  ondansetron (ZOFRAN) 4 MG tablet, Take 1 tablet (4 mg total) by mouth every 8 (eight) hours as needed for nausea or vomiting., Disp: 20 tablet, Rfl: 0 .  pioglitazone (ACTOS) 30 MG tablet, Take 1 tablet (30 mg total) by mouth daily., Disp: 90 tablet, Rfl: 3 .  promethazine (PHENERGAN) 25 MG tablet, Take 1 tablet (25 mg total) by mouth every 6 (six) hours as needed for nausea or vomiting., Disp: 30 tablet, Rfl: 0 .  promethazine-dextromethorphan (PROMETHAZINE-DM) 6.25-15 MG/5ML syrup, Take 2.5 mLs by mouth 4 (four) times daily as needed for cough., Disp: 118 mL, Rfl: 0 .  Semaglutide (RYBELSUS) 7 MG TABS, Take 7 mg by mouth daily. Start after $RemoveBef'3mg'ZgsyZNMSkE$  sample pack, Disp: 30 tablet, Rfl: 2 .  sertraline (ZOLOFT) 100 MG tablet, Take 2 tablets (200 mg total) by mouth daily., Disp: 180 tablet, Rfl: 3 .  SYMBICORT 160-4.5 MCG/ACT inhaler, TAKE 2 PUFFS BY MOUTH TWICE A DAY, Disp: 10.2 g, Rfl: 0 .  traMADol (ULTRAM) 50 MG tablet, Take 1 tablet (50 mg total) by mouth every 8 (eight) hours as needed for severe pain., Disp: 90 tablet, Rfl: 5 Social History   Socioeconomic History  . Marital status: Divorced    Spouse name: Not on file  . Number of children: Not on file  . Years of education: Not on file  . Highest education level: Not on file  Occupational History  . Not on file  Tobacco Use  . Smoking status: Former Smoker    Packs/day: 1.00    Years: 5.00    Pack years: 5.00    Quit date: 04/04/1993    Years since quitting: 27.2  . Smokeless tobacco: Never Used  . Tobacco comment: smoked when went out with friends/social smoker. pack would last a week.  Vaping Use  . Vaping Use: Former  Substance and Sexual Activity  . Alcohol use: No  . Drug use: No  . Sexual activity: Yes    Birth control/protection: I.U.D.  Other Topics Concern  . Not on file   Social History Narrative  . Not on file   Social Determinants of Health  Financial Resource Strain: Not on file  Food Insecurity: Not on file  Transportation Needs: Not on file  Physical Activity: Not on file  Stress: Not on file  Social Connections: Not on file  Intimate Partner Violence: Not on file   Family History  Problem Relation Age of Onset  . Mental illness Other   . Diabetes Other   . Healthy Mother   . Cancer Father   . Colon cancer Neg Hx   . Stomach cancer Neg Hx   . Pancreatic cancer Neg Hx     Objective: Office vital signs reviewed. BP 127/79   Pulse 97   Temp (!) 97 F (36.1 C) (Temporal)   Ht $R'5\' 8"'ph$  (1.727 m)   Wt (!) 306 lb 12.8 oz (139.2 kg)   SpO2 96%   BMI 46.65 kg/m   Physical Examination:  General: Awake, alert, morbidly obese, No acute distress HEENT: Normal; sclera white. Moist mucous membranes Cardio: regular rate and rhythm, S1S2 heard, no murmurs appreciated Pulm: clear to auscultation bilaterally, no wheezes, rhonchi or rales; normal work of breathing on room air Extremities: warm, well perfused, No edema, cyanosis or clubbing; +1 pulses bilaterally MSK: Gait antalgic patient is ambulating independently. Normal tone Neuro: See DM foot  Diabetic Foot Exam - Simple   Simple Foot Form Diabetic Foot exam was performed with the following findings: Yes 07/10/2020 12:30 PM  Visual Inspection No deformities, no ulcerations, no other skin breakdown bilaterally: Yes Sensation Testing Intact to touch and monofilament testing bilaterally: Yes Pulse Check Posterior Tibialis and Dorsalis pulse intact bilaterally: Yes Comments    Assessment/ Plan: 50 y.o. female   DDD (degenerative disc disease), lumbar  Chronic pain of both knees  Chronic abdominal pain  Moderate persistent asthma without complication - Plan: montelukast (SINGULAIR) 10 MG tablet  Type 2 diabetes mellitus with hyperglycemia, with long-term current use of insulin  (HCC)  Acquired hypothyroidism - Plan: levothyroxine (SYNTHROID) 125 MCG tablet  Attention deficit disorder (ADD) without hyperactivity - Plan: lisdexamfetamine (VYVANSE) 70 MG capsule  Episodic mood disorder ([place]), Chronic - Plan: sertraline (ZOLOFT) 100 MG tablet  Being followed by sports medicine center and anticipate referral back to orthopedics and/or neurosurgery for neck issues.   Asthma stable. Lung exam unremarkable today. Continue Singulair and albuterol  Diabetes not at goal with A1c at 7.4. Advance her Rybelsus to 14 mg daily. Continue Actos. May consider increasing Actos to 45 mg but she is experienced intermittent hypoglycemia in the past. She will continue her insulin as scheduled. Continue freestyle libre  Thyroid panel appropriate. Continue Synthroid. This has been renewed  Trial back on Vyvanse. National narcotic database was reviewed and there were no red flags  Zoloft renewed.  No orders of the defined types were placed in this encounter.  No orders of the defined types were placed in this encounter.    Janora Norlander, DO [place] [phone]

## 2020-07-10 NOTE — Patient Instructions (Signed)
Increase Rybelsyus to 14mg  daily.  Depo sent  Will have notes done today.

## 2020-07-11 ENCOUNTER — Other Ambulatory Visit: Payer: Self-pay | Admitting: Family Medicine

## 2020-07-11 ENCOUNTER — Telehealth (INDEPENDENT_AMBULATORY_CARE_PROVIDER_SITE_OTHER): Payer: Medicaid Other | Admitting: Family Medicine

## 2020-07-11 VITALS — Ht 68.0 in | Wt 306.0 lb

## 2020-07-11 DIAGNOSIS — M542 Cervicalgia: Secondary | ICD-10-CM

## 2020-07-11 DIAGNOSIS — M47812 Spondylosis without myelopathy or radiculopathy, cervical region: Secondary | ICD-10-CM

## 2020-07-11 NOTE — Progress Notes (Signed)
Virtual Visit via Video Note  I connected with Judith [name] on 07/11/20 at  9:50 AM EST by a video enabled telemedicine application and verified that I am speaking with the correct person using two identifiers.  Location: Patient: vehicle  Provider: office   I discussed the limitations of evaluation and management by telemedicine and the availability of in person appointments. The patient expressed understanding and agreed to proceed.  History of Present Illness:  Judith [name] is a 50 year old female that is following up after the MRI of her cervical spine.  This was showing spondylosis and reversal of the cervical lordosis.   Observations/Objective:  Gen: NAD, alert, cooperative with exam, well-appearing   Assessment and Plan:  Spondylosis of cervical spine: Has spondylosis and degenerative changes that could be responsible for some of her issues. -Counseled on exercise therapy and supportive care. -Cervical epidural.  Follow Up Instructions:    I discussed the assessment and treatment plan with the patient. The patient was provided an opportunity to ask questions and all were answered. The patient agreed with the plan and demonstrated an understanding of the instructions.   The patient was advised to call back or seek an in-person evaluation if the symptoms worsen or if the condition fails to improve as anticipated.   Clearance Coots, MD

## 2020-07-11 NOTE — Assessment & Plan Note (Addendum)
Has spondylosis and degenerative changes that could be responsible for some of her issues. -Counseled on exercise therapy and supportive care. -Cervical epidural.

## 2020-07-12 NOTE — Telephone Encounter (Signed)
Please advise if this has been completed.  Thanks!  

## 2020-07-15 ENCOUNTER — Ambulatory Visit: Payer: Medicaid Other | Admitting: Physical Therapy

## 2020-07-15 NOTE — Telephone Encounter (Signed)
PA in process for Rybelsus  Confirmation #:[id number]

## 2020-07-16 ENCOUNTER — Encounter: Payer: Medicaid Other | Admitting: Physical Therapy

## 2020-07-16 DIAGNOSIS — R1310 Dysphagia, unspecified: Secondary | ICD-10-CM

## 2020-07-16 DIAGNOSIS — R1319 Other dysphagia: Secondary | ICD-10-CM

## 2020-07-17 ENCOUNTER — Encounter: Payer: Medicaid Other | Admitting: Physical Therapy

## 2020-07-17 NOTE — Telephone Encounter (Signed)
PA APPROVED for Rybelsus 07/15/2020 - 07/10/2021  Pharmacy aware

## 2020-07-22 ENCOUNTER — Encounter: Payer: Medicaid Other | Admitting: Physical Therapy

## 2020-07-23 ENCOUNTER — Other Ambulatory Visit: Payer: Self-pay

## 2020-07-23 ENCOUNTER — Ambulatory Visit
Admission: RE | Admit: 2020-07-23 | Discharge: 2020-07-23 | Disposition: A | Discharge status: Home / Self Care | Payer: Medicaid Other | Source: Ambulatory Visit | Attending: Family Medicine | Admitting: Family Medicine

## 2020-07-23 DIAGNOSIS — M47812 Spondylosis without myelopathy or radiculopathy, cervical region: Secondary | ICD-10-CM | POA: Diagnosis not present

## 2020-07-23 MED ORDER — IOPAMIDOL (ISOVUE-M 300) INJECTION 61%: 1.0000 mL | Freq: Once | INTRAMUSCULAR | Status: DC

## 2020-07-23 MED ORDER — IOPAMIDOL (ISOVUE-M 300) INJECTION 61%
1.0000 mL | Freq: Once | INTRAMUSCULAR | Status: AC | PRN
  Administered 2020-07-23: 1 mL via EPIDURAL

## 2020-07-23 MED ORDER — TRIAMCINOLONE ACETONIDE 40 MG/ML IJ SUSP (RADIOLOGY)
60.0000 mg | Freq: Once | INTRAMUSCULAR | Status: AC
  Administered 2020-07-23: 60 mg via EPIDURAL

## 2020-07-23 MED ORDER — TRIAMCINOLONE ACETONIDE 40 MG/ML IJ SUSP (RADIOLOGY): 60.0000 mg | Freq: Once | INTRAMUSCULAR | Status: DC

## 2020-07-23 NOTE — Discharge Instructions (Signed)

## 2020-07-24 ENCOUNTER — Encounter: Payer: Medicaid Other | Admitting: Physical Therapy

## 2020-07-24 ENCOUNTER — Telehealth: Payer: Self-pay | Admitting: Family Medicine

## 2020-07-24 ENCOUNTER — Other Ambulatory Visit: Payer: Self-pay

## 2020-07-24 DIAGNOSIS — K222 Esophageal obstruction: Secondary | ICD-10-CM

## 2020-07-24 DIAGNOSIS — R1310 Dysphagia, unspecified: Secondary | ICD-10-CM

## 2020-07-24 NOTE — Telephone Encounter (Signed)
Pt needs a follow up appt and when offered the first available she said she needed one sooner. Requested to talk to the nurse about it.

## 2020-07-25 NOTE — Telephone Encounter (Signed)
Left message to call back  

## 2020-08-05 ENCOUNTER — Telehealth: Payer: Self-pay | Admitting: Family Medicine

## 2020-08-05 NOTE — Telephone Encounter (Signed)
Patient called states had : DIAGNOSTIC EPIDURAL INJECTION on 07/23/20.  -- Per pt pain did not decrease w/ Epidural & she wants to know next step.  --Forwarding message to provider to contact pt via cell# or Mychart.  --glh

## 2020-08-06 ENCOUNTER — Other Ambulatory Visit: Payer: Self-pay | Admitting: Family

## 2020-08-07 NOTE — Telephone Encounter (Signed)
Left VM for patient. If she calls back please have her speak with a nurse/CMA and inform that we may have to try a nerve study or facet injections if her pain is ongoing.   If any questions then please take the best time and phone number to call and I will try to call her back.   Rosemarie Ax, MD [facility] 08/07/2020, 3:08 PM

## 2020-08-08 ENCOUNTER — Other Ambulatory Visit: Payer: Self-pay | Admitting: Family Medicine

## 2020-08-08 NOTE — Telephone Encounter (Signed)
Pt informed of below.  She states her pain is unchanged. She is afraid to try facet injections with steroids because the epidural elevated her glucose. She states you mentioned neurosurgeon referral.   She states you can send MyChart messages in the future.

## 2020-08-09 ENCOUNTER — Encounter: Payer: Self-pay | Admitting: Family Medicine

## 2020-08-14 ENCOUNTER — Other Ambulatory Visit: Payer: Self-pay | Admitting: Physician Assistant

## 2020-08-16 ENCOUNTER — Telehealth: Payer: Self-pay

## 2020-08-16 NOTE — Telephone Encounter (Signed)
Pt aware sample up front

## 2020-08-19 ENCOUNTER — Telehealth: Payer: Self-pay

## 2020-08-19 DIAGNOSIS — M4712 Other spondylosis with myelopathy, cervical region: Secondary | ICD-10-CM | POA: Diagnosis not present

## 2020-08-19 DIAGNOSIS — M4012 Other secondary kyphosis, cervical region: Secondary | ICD-10-CM | POA: Diagnosis not present

## 2020-08-19 DIAGNOSIS — R490 Dysphonia: Secondary | ICD-10-CM | POA: Diagnosis not present

## 2020-08-19 NOTE — Telephone Encounter (Signed)
Spoke with pt, she was seen on 02/23 for chronic follow-up. Only 1 month Vyvanse given but isn't seen until 05/06. Can you go ahead and send in refill?

## 2020-08-19 NOTE — Telephone Encounter (Signed)
Was supposed to follow up. DENIED.

## 2020-08-19 NOTE — Telephone Encounter (Signed)
  Prescription Request  08/19/2020  What is the name of the medication or equipment? Vyvanse  Have you contacted your pharmacy to request a refill? (if applicable) Yes  Which pharmacy would you like this sent to? [facility], [place]   Pt almost out of her medicine and needs refills sent in. Had last check up on 07/10/20

## 2020-08-20 NOTE — Telephone Encounter (Signed)
Appt made/ pt aware  

## 2020-08-23 ENCOUNTER — Other Ambulatory Visit: Payer: Self-pay

## 2020-08-23 ENCOUNTER — Ambulatory Visit (AMBULATORY_SURGERY_CENTER): Payer: Medicaid Other | Admitting: *Deleted

## 2020-08-23 VITALS — Ht 67.0 in | Wt 299.0 lb

## 2020-08-23 DIAGNOSIS — K222 Esophageal obstruction: Secondary | ICD-10-CM

## 2020-08-23 DIAGNOSIS — R1310 Dysphagia, unspecified: Secondary | ICD-10-CM

## 2020-08-23 NOTE — Progress Notes (Signed)
No egg or soy allergy known to patient  No issues with past sedation with any surgeries or procedures Patient denies ever being told they had issues or difficulty with intubation  No FH of Malignant Hyperthermia No diet pills per patient No home 02 use per patient  No blood thinners per patient  Pt denies issues with constipation currently on Linzess  No A fib or A flutter  EMMI video to pt or via [place] 19 guidelines implemented in [place] today with Pt and RN  Pt is fully vaccinated  for Covid - and Covid infection 06-21-2020- therefore according to office Rn pt will NOT need Covid testing prior to EGD 4-21 for [facility]   Due to the COVID-19 pandemic we are asking patients to follow certain guidelines.  Pt aware of COVID protocols and [facility] guidelines   Pt verified name, DOB, address and insurance during PV today. Pt mailed instruction packet to included paper to complete and mail back to [facility] with addressed and stamped envelope, Emmi video, copy of consent form to read and not return, and instructions. PV completed over the phone. Pt encouraged to call with questions or issues. My Chart instructions to pt as well

## 2020-08-27 ENCOUNTER — Ambulatory Visit (INDEPENDENT_AMBULATORY_CARE_PROVIDER_SITE_OTHER): Payer: Medicaid Other | Admitting: Family Medicine

## 2020-08-27 ENCOUNTER — Other Ambulatory Visit: Payer: Self-pay

## 2020-08-27 ENCOUNTER — Encounter: Payer: Self-pay | Admitting: Family Medicine

## 2020-08-27 VITALS — BP 116/77 | HR 119 | Ht 67.0 in | Wt 300.2 lb

## 2020-08-27 DIAGNOSIS — M5136 Other intervertebral disc degeneration, lumbar region: Secondary | ICD-10-CM

## 2020-08-27 DIAGNOSIS — F39 Unspecified mood [affective] disorder: Secondary | ICD-10-CM

## 2020-08-27 DIAGNOSIS — M5030 Other cervical disc degeneration, unspecified cervical region: Secondary | ICD-10-CM

## 2020-08-27 DIAGNOSIS — J4521 Mild intermittent asthma with (acute) exacerbation: Secondary | ICD-10-CM

## 2020-08-27 DIAGNOSIS — Z79899 Other long term (current) drug therapy: Secondary | ICD-10-CM

## 2020-08-27 DIAGNOSIS — E1165 Type 2 diabetes mellitus with hyperglycemia: Secondary | ICD-10-CM | POA: Diagnosis not present

## 2020-08-27 DIAGNOSIS — F988 Other specified behavioral and emotional disorders with onset usually occurring in childhood and adolescence: Secondary | ICD-10-CM | POA: Diagnosis not present

## 2020-08-27 DIAGNOSIS — Z794 Long term (current) use of insulin: Secondary | ICD-10-CM | POA: Diagnosis not present

## 2020-08-27 MED ORDER — LISDEXAMFETAMINE DIMESYLATE 70 MG PO CAPS: 70.0000 mg | ORAL_CAPSULE | Freq: Every day | ORAL | 0 refills | Status: DC

## 2020-08-27 MED ORDER — GABAPENTIN 300 MG PO CAPS: ORAL_CAPSULE | ORAL | 3 refills | Status: DC

## 2020-08-27 MED ORDER — ALBUTEROL SULFATE HFA 108 (90 BASE) MCG/ACT IN AERS: 2.0000 | INHALATION_SPRAY | Freq: Four times a day (QID) | RESPIRATORY_TRACT | 1 refills | Status: DC | PRN

## 2020-08-27 NOTE — Progress Notes (Signed)
Subjective: CC: ADHD PCP: Janora Norlander, DO HPI:Judith [name] is a 50 y.o. female presenting to clinic today for:  1.  ADHD Patient reports that the Vyvanse really did help her attention and focus.  She is out over the last week she is noticed a tremendous difference between when she was on it when she was off of it.  She denies any chest pain, shortness of breath, tremor, increased anxiety or insomnia.  No constipation, dry mouth  2.  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  She also uses Motrin as needed.  She still has breakthrough symptoms and often tries to avoid anything but Motrin if she has to drive due to excessive sedation.  She does find the Flexeril to be helpful but again it causes her to be too sleepy and she does not feel safe operating a motor vehicle with this.  She would like to try something nonnarcotic in efforts to improve pain while she awaits formal evaluation by ENT for surgery on her C-spine.  She has not been on gabapentin but is willing to try.  3.  Asthma Patient needs refill on her inhaler.   ROS: Per HPI  Allergies  Allergen Reactions  . Diphenhydramine Other (See Comments)    Hair feels like it is crawling  . Morphine Nausea And Vomiting    PCA PUMP/ DRIP -- N/V IV PUSH IN ER NO PROBLEM PER PT.  . Prednisone Nausea And Vomiting  . Adhesive [Tape]     Paper tape causes irritation   . Erythromycin Hives  . Metformin And Related     Diarrhea with IR and XR products  . Sulfonamide Derivatives Other (See Comments)    Does not take due to family history   Past Medical History:  Diagnosis Date  . Allergy   . Anxiety   . Arthritis   . Asthma   . Bipolar 1 disorder ([place])   . Blood transfusion without reported diagnosis    with child birth 72 yrs ago   . Bronchitis, chronic ([place])   . Constipation   . COVID-19 virus infection 06/21/2020  . DDD (degenerative disc  disease), cervical   . Depression   . Diabetes mellitus without complication ([place])   . Dysphagia   . Gastroparesis   . GERD (gastroesophageal reflux disease)   . History of MRSA infection   . Hyperlipemia   . Hypothyroid   . Migraines   . Sleep apnea    no cpap     Current Outpatient Medications:  .  Accu-Chek FastClix Lancets MISC, Test blood sugars daily Dx E11.9, Disp: 102 each, Rfl: 3 .  albuterol (PROVENTIL) (2.5 MG/3ML) 0.083% nebulizer solution, Nebulize 1 vial three times daily, Disp: 150 mL, Rfl: 2 .  albuterol (PROVENTIL) (2.5 MG/3ML) 0.083% nebulizer solution, Take 3 mLs (2.5 mg total) by nebulization every 6 (six) hours as needed for wheezing or shortness of breath., Disp: 75 mL, Rfl: 12 .  albuterol (VENTOLIN HFA) 108 (90 Base) MCG/ACT inhaler, Inhale 2 puffs into the lungs every 6 (six) hours as needed for wheezing or shortness of breath., Disp: 8 g, Rfl: 1 .  atorvastatin (LIPITOR) 40 MG tablet, Take 1 tablet (40 mg total) by mouth daily., Disp: 90 tablet, Rfl: 0 .  Blood Glucose Monitoring Suppl (ACCU-CHEK GUIDE ME) w/Device KIT, Use to check BS BID and as needed. DX: E11.9, Disp: 1 kit, Rfl:  0 .  Continuous Blood Gluc Receiver (FREESTYLE LIBRE READER) DEVI, 1 Units by Does not apply route daily. UAD to test BGs daily. Dx E11.40, Disp: 1 each, Rfl: 1 .  Continuous Blood Gluc Sensor (FREESTYLE LIBRE 14 DAY SENSOR) MISC, 1 Units by Does not apply route daily. UAD to check blood sugars R73.09, Disp: 2 each, Rfl: 12 .  cyclobenzaprine (FLEXERIL) 10 MG tablet, Take 1 tablet (10 mg total) by mouth 3 (three) times daily as needed for muscle spasms., Disp: 60 tablet, Rfl: 5 .  dexlansoprazole (DEXILANT) 60 MG capsule, TAKE 1 CAPSULE BY MOUTH EVERY DAY, Disp: 30 capsule, Rfl: 3 .  diclofenac Sodium (VOLTAREN) 1 % GEL, Apply 4 g topically 4 (four) times daily. For knee pain, Disp: 400 g, Rfl: 2 .  dicyclomine (BENTYL) 20 MG tablet, TAKE 1 TABLET (20 MG TOTAL) BY MOUTH EVERY 6 (SIX)  HOURS., Disp: 360 tablet, Rfl: 0 .  glucose blood (ACCU-CHEK GUIDE) test strip, Use as instructed E11.9, Disp: 100 strip, Rfl: 12 .  hydrOXYzine (ATARAX/VISTARIL) 25 MG tablet, TAKE 1 TABLET BY MOUTH THREE TIMES A DAY, Disp: 270 tablet, Rfl: 1 .  ibuprofen (ADVIL) 600 MG tablet, Take 1 tablet (600 mg total) by mouth every 8 (eight) hours as needed., Disp: 60 tablet, Rfl: 1 .  insulin degludec (TRESIBA FLEXTOUCH) 200 UNIT/ML FlexTouch Pen, Inject 66 Units into the skin at bedtime. (Patient taking differently: Inject 65 Units into the skin at bedtime. 40-50 depending on blood sugar), Disp: 15 mL, Rfl: 3 .  Insulin Pen Needle 31G X 5 MM MISC, 1 Units by Does not apply route daily., Disp: 100 each, Rfl: 5 .  lamoTRIgine (LAMICTAL) 150 MG tablet, Take 1 tablet (150 mg total) by mouth 2 (two) times daily., Disp: 60 tablet, Rfl: 2 .  levothyroxine (SYNTHROID) 125 MCG tablet, Take 1 tablet (125 mcg total) by mouth daily., Disp: 90 tablet, Rfl: 1 .  linaclotide (LINZESS) 72 MCG capsule, Take 1 capsule (72 mcg total) by mouth daily before breakfast., Disp: 30 capsule, Rfl: 2 .  lisdexamfetamine (VYVANSE) 70 MG capsule, Take 1 capsule (70 mg total) by mouth daily., Disp: 30 capsule, Rfl: 0 .  medroxyPROGESTERone (DEPO-PROVERA) 150 MG/ML injection, Inject 1 mL (150 mg total) into the muscle every 3 (three) months., Disp: 1 mL, Rfl: 3 .  montelukast (SINGULAIR) 10 MG tablet, TAKE ONE TAB BY MOUTH AT BEDTIME, Disp: 90 tablet, Rfl: 3 .  nystatin cream (MYCOSTATIN), Apply 1 application topically 2 (two) times daily. x2 weeks, Disp: 60 g, Rfl: 0 .  ondansetron (ZOFRAN) 4 MG tablet, Take 1 tablet (4 mg total) by mouth every 8 (eight) hours as needed for nausea or vomiting., Disp: 20 tablet, Rfl: 0 .  pioglitazone (ACTOS) 30 MG tablet, Take 1 tablet (30 mg total) by mouth daily., Disp: 90 tablet, Rfl: 3 .  promethazine (PHENERGAN) 25 MG tablet, Take 1 tablet (25 mg total) by mouth every 6 (six) hours as needed for  nausea or vomiting., Disp: 30 tablet, Rfl: 0 .  Semaglutide (RYBELSUS) 14 MG TABS, Take 1 tablet by mouth daily before breakfast., Disp: 30 tablet, Rfl: prn .  sertraline (ZOLOFT) 100 MG tablet, Take 2 tablets (200 mg total) by mouth daily., Disp: 180 tablet, Rfl: 3 .  traMADol (ULTRAM) 50 MG tablet, Take 1 tablet (50 mg total) by mouth every 8 (eight) hours as needed for severe pain., Disp: 90 tablet, Rfl: 5 .  SYMBICORT 160-4.5 MCG/ACT inhaler, TAKE 2 PUFFS BY  MOUTH TWICE A DAY (Patient not taking: No sig reported), Disp: 10.2 g, Rfl: 0 Social History   Socioeconomic History  . Marital status: Divorced    Spouse name: Not on file  . Number of children: Not on file  . Years of education: Not on file  . Highest education level: Not on file  Occupational History  . Not on file  Tobacco Use  . Smoking status: Former Smoker    Packs/day: 1.00    Years: 5.00    Pack years: 5.00    Quit date: 04/04/1993    Years since quitting: 27.4  . Smokeless tobacco: Never Used  . Tobacco comment: smoked when went out with friends/social smoker. pack would last a week.  Vaping Use  . Vaping Use: Former  Substance and Sexual Activity  . Alcohol use: No  . Drug use: No  . Sexual activity: Yes    Birth control/protection: I.U.D.  Other Topics Concern  . Not on file  Social History Narrative  . Not on file   Social Determinants of Health   Financial Resource Strain: Not on file  Food Insecurity: Not on file  Transportation Needs: Not on file  Physical Activity: Not on file  Stress: Not on file  Social Connections: Not on file  Intimate Partner Violence: Not on file   Family History  Problem Relation Age of Onset  . Mental illness Other   . Diabetes Other   . Healthy Mother   . Cancer Father   . Non-Hodgkin's lymphoma Father   . Breast cancer Paternal Grandmother   . Lung cancer Paternal Grandmother   . Esophageal cancer Other   . Colon cancer Neg Hx   . Stomach cancer Neg Hx   .  Pancreatic cancer Neg Hx   . Colon polyps Neg Hx     Objective: Office vital signs reviewed. BP 116/77   Pulse (!) 119   Ht _0  (1.702 m)   Wt (!) 300 lb 3.2 oz (136.2 kg)   SpO2 100%   BMI 47.02 kg/m   Physical Examination:  General: Awake, alert, morbidly obese, No acute distress HEENT: Normal; sclera white. Cardio: regular rate and rhythm, S1S2 heard, no murmurs appreciated Pulm: clear to auscultation bilaterally, no wheezes, rhonchi or rales; normal work of breathing on room air Extremities: warm, well perfused, No edema, cyanosis or clubbing; +2 pulses bilaterally MSK: Ambulating independently Assessment/ Plan: 50 y.o. female   Attention deficit disorder (ADD) without hyperactivity - Plan: ToxASSURE Select 13 (MW), Urine, lisdexamfetamine (VYVANSE) 70 MG capsule, lisdexamfetamine (VYVANSE) 70 MG capsule, lisdexamfetamine (VYVANSE) 70 MG capsule  Controlled substance agreement signed - Plan: ToxASSURE Select 13 (MW), Urine  Episodic mood disorder ([place])  Type 2 diabetes mellitus with hyperglycemia, with long-term current use of insulin ([place]) - Plan: Microalbumin / creatinine urine ratio  DDD (degenerative disc disease), lumbar - Plan: gabapentin (NEURONTIN) 300 MG capsule  DDD (degenerative disc disease), cervical - Plan: gabapentin (NEURONTIN) 300 MG capsule  Mild intermittent asthma with acute exacerbation - Plan: albuterol (VENTOLIN HFA) 108 (90 Base) MCG/ACT inhaler  ADHD stable with resumption of Vyvanse 70 mg.  CSC and UDS were updated as per office policy today.  16-monthsupply sent.  National narcotic database was reviewed and there were no red flags  Urine microscopy ordered today but diabetes appointment will be deferred until our next visit in May and she is not due for A1c  I have added gabapentin for degenerative changes in the  C-spine and lumbar spine.  She is taking tramadol 3 times daily as needed as well as Flexeril but sometimes these are overly  sedating and she wished to try nonnarcotic which I think is totally agreeable.  Caution sedation with this and therefore will titrate gradually.  Instructions reviewed with the patient and this has been sent to her pharmacy  Orders Placed This Encounter  Procedures  . ToxASSURE Select 13 (MW), Urine  . Microalbumin / creatinine urine ratio   Meds ordered this encounter  Medications  . gabapentin (NEURONTIN) 300 MG capsule    Sig: Take 1 capsule (300 mg total) by mouth at bedtime for 3 days, THEN 1 capsule (300 mg total) 2 (two) times daily for 3 days, THEN 1 capsule (300 mg total) 3 (three) times daily.    Dispense:  90 capsule    Refill:  3  . albuterol (VENTOLIN HFA) 108 (90 Base) MCG/ACT inhaler    Sig: Inhale 2 puffs into the lungs every 6 (six) hours as needed for wheezing or shortness of breath.    Dispense:  8 g    Refill:  1  . lisdexamfetamine (VYVANSE) 70 MG capsule    Sig: Take 1 capsule (70 mg total) by mouth daily.    Dispense:  30 capsule    Refill:  0  . lisdexamfetamine (VYVANSE) 70 MG capsule    Sig: Take 1 capsule (70 mg total) by mouth daily.    Dispense:  30 capsule    Refill:  0  . lisdexamfetamine (VYVANSE) 70 MG capsule    Sig: Take 1 capsule (70 mg total) by mouth daily.    Dispense:  30 capsule    Refill:  Lyman, DO [place] [phone]

## 2020-08-28 ENCOUNTER — Other Ambulatory Visit: Payer: Self-pay

## 2020-08-28 ENCOUNTER — Telehealth: Payer: Self-pay

## 2020-08-28 ENCOUNTER — Other Ambulatory Visit: Payer: Medicaid Other

## 2020-08-28 DIAGNOSIS — Z79899 Other long term (current) drug therapy: Secondary | ICD-10-CM | POA: Diagnosis not present

## 2020-08-28 DIAGNOSIS — K219 Gastro-esophageal reflux disease without esophagitis: Secondary | ICD-10-CM | POA: Diagnosis not present

## 2020-08-28 DIAGNOSIS — J3489 Other specified disorders of nose and nasal sinuses: Secondary | ICD-10-CM | POA: Diagnosis not present

## 2020-08-28 DIAGNOSIS — F988 Other specified behavioral and emotional disorders with onset usually occurring in childhood and adolescence: Secondary | ICD-10-CM | POA: Diagnosis not present

## 2020-08-28 DIAGNOSIS — E785 Hyperlipidemia, unspecified: Secondary | ICD-10-CM

## 2020-08-28 DIAGNOSIS — E1165 Type 2 diabetes mellitus with hyperglycemia: Secondary | ICD-10-CM | POA: Diagnosis not present

## 2020-08-28 DIAGNOSIS — Z794 Long term (current) use of insulin: Secondary | ICD-10-CM | POA: Diagnosis not present

## 2020-08-28 NOTE — Telephone Encounter (Signed)
Per patient's chart, she has been scheduled for a PFT on 09/26/20 at 10am, OV with Beth at 11am. Her covid test is scheduled for 09/18/20.

## 2020-08-28 NOTE — Telephone Encounter (Signed)
R/C to Alyssa 

## 2020-08-29 ENCOUNTER — Encounter (HOSPITAL_COMMUNITY): Payer: Self-pay | Admitting: Gastroenterology

## 2020-08-29 ENCOUNTER — Other Ambulatory Visit: Payer: Self-pay

## 2020-08-29 LAB — MICROALBUMIN / CREATININE URINE RATIO
Creatinine, Urine: 94.4 mg/dL
Microalb/Creat Ratio: 5 mg/g creat (ref 0–29)
Microalbumin, Urine: 4.8 ug/mL

## 2020-09-02 LAB — TOXASSURE SELECT 13 (MW), URINE

## 2020-09-05 ENCOUNTER — Encounter (HOSPITAL_COMMUNITY): Payer: Self-pay | Admitting: Registered Nurse

## 2020-09-05 ENCOUNTER — Ambulatory Visit (HOSPITAL_COMMUNITY): Admission: RE | Admit: 2020-09-05 | Payer: Medicaid Other | Source: Home / Self Care | Admitting: Gastroenterology

## 2020-09-05 SURGERY — ESOPHAGOGASTRODUODENOSCOPY (EGD) WITH PROPOFOL
Anesthesia: Monitor Anesthesia Care

## 2020-09-05 NOTE — Progress Notes (Signed)
Talked with patient. Patient stated she and her significant other have been sick and she was needing to cancel her appointment for today. Dr. Havery Moros made aware. Informed patient to call Dr. Ozella Rocks office to reschedule.

## 2020-09-05 NOTE — Progress Notes (Signed)
Pt called left message that should would not be able to come to procedure today due that her significant other was sick and they were her ride. She stated on message she would need to reschedule.

## 2020-09-06 ENCOUNTER — Ambulatory Visit: Payer: Medicaid Other

## 2020-09-06 DIAGNOSIS — M4012 Other secondary kyphosis, cervical region: Secondary | ICD-10-CM | POA: Diagnosis not present

## 2020-09-06 DIAGNOSIS — M4712 Other spondylosis with myelopathy, cervical region: Secondary | ICD-10-CM | POA: Diagnosis not present

## 2020-09-10 ENCOUNTER — Ambulatory Visit (INDEPENDENT_AMBULATORY_CARE_PROVIDER_SITE_OTHER): Payer: Medicaid Other

## 2020-09-10 ENCOUNTER — Telehealth: Payer: Self-pay

## 2020-09-10 ENCOUNTER — Other Ambulatory Visit: Payer: Self-pay

## 2020-09-10 ENCOUNTER — Other Ambulatory Visit: Payer: Self-pay | Admitting: Neurosurgery

## 2020-09-10 DIAGNOSIS — Z3042 Encounter for surveillance of injectable contraceptive: Secondary | ICD-10-CM

## 2020-09-10 DIAGNOSIS — E785 Hyperlipidemia, unspecified: Secondary | ICD-10-CM | POA: Diagnosis not present

## 2020-09-10 MED ORDER — MEDROXYPROGESTERONE ACETATE 150 MG/ML IM SUSP
150.0000 mg | INTRAMUSCULAR | Status: AC
  Administered 2020-09-10 – 2021-07-23 (×4): 150 mg via INTRAMUSCULAR

## 2020-09-10 NOTE — Telephone Encounter (Signed)
Patient aware that she is too early for HgbA1c.

## 2020-09-10 NOTE — Progress Notes (Signed)
Patient is here today for her first injection of Medroxyprogesterone at our office.  She has previously been getting the injections at the [facility].  I contacted them to verify patient is within her window of time to get today's injection and verified that her last injection there was on 06/17/20.  Patient is within the window of time and injection was given here today.  Medroxyprogesterone injection given to right upper outer quadrant, patient tolerated well.

## 2020-09-11 ENCOUNTER — Other Ambulatory Visit: Payer: Self-pay | Admitting: Family Medicine

## 2020-09-11 DIAGNOSIS — E785 Hyperlipidemia, unspecified: Secondary | ICD-10-CM

## 2020-09-11 LAB — CMP14+EGFR
ALT: 12 IU/L (ref 0–32)
AST: 10 IU/L (ref 0–40)
Albumin/Globulin Ratio: 1.2 (ref 1.2–2.2)
Albumin: 4.1 g/dL (ref 3.8–4.8)
Alkaline Phosphatase: 129 IU/L — ABNORMAL HIGH (ref 44–121)
BUN/Creatinine Ratio: 13 (ref 9–23)
BUN: 13 mg/dL (ref 6–24)
Bilirubin Total: 0.3 mg/dL (ref 0.0–1.2)
CO2: 25 mmol/L (ref 20–29)
Calcium: 9.6 mg/dL (ref 8.7–10.2)
Chloride: 99 mmol/L (ref 96–106)
Creatinine, Ser: 1.02 mg/dL — ABNORMAL HIGH (ref 0.57–1.00)
Globulin, Total: 3.3 g/dL (ref 1.5–4.5)
Glucose: 125 mg/dL — ABNORMAL HIGH (ref 65–99)
Potassium: 4.2 mmol/L (ref 3.5–5.2)
Sodium: 141 mmol/L (ref 134–144)
Total Protein: 7.4 g/dL (ref 6.0–8.5)
eGFR: 67 mL/min/{1.73_m2} (ref >59)

## 2020-09-11 LAB — LIPID PANEL
Chol/HDL Ratio: 7 ratio — ABNORMAL HIGH (ref 0.0–4.4)
Cholesterol, Total: 204 mg/dL — ABNORMAL HIGH (ref 100–199)
HDL: 29 mg/dL — ABNORMAL LOW (ref >39)
LDL Chol Calc (NIH): 130 mg/dL — ABNORMAL HIGH (ref 0–99)
Triglycerides: 250 mg/dL — ABNORMAL HIGH (ref 0–149)
VLDL Cholesterol Cal: 45 mg/dL — ABNORMAL HIGH (ref 5–40)

## 2020-09-11 MED ORDER — ROSUVASTATIN CALCIUM 40 MG PO TABS
40.0000 mg | ORAL_TABLET | Freq: Every day | ORAL | 3 refills | Status: DC
Start: 2020-09-11 — End: 2021-04-29

## 2020-09-14 ENCOUNTER — Emergency Department: Payer: PRIVATE HEALTH INSURANCE | Primary: Family Medicine

## 2020-09-14 ENCOUNTER — Inpatient Hospital Stay
Admit: 2020-09-14 | Discharge: 2020-09-14 | Disposition: A | Discharge status: Home / Self Care | Payer: PRIVATE HEALTH INSURANCE | Attending: Emergency Medicine

## 2020-09-14 ENCOUNTER — Emergency Department: Admit: 2020-09-14 | Payer: PRIVATE HEALTH INSURANCE | Primary: Family Medicine

## 2020-09-14 DIAGNOSIS — R519 Headache, unspecified: Secondary | ICD-10-CM

## 2020-09-14 LAB — METABOLIC PANEL, COMPREHENSIVE
A-G Ratio: 1.4 (ref 1.1–2.2)
ALT (SGPT): 46 U/L (ref 12–78)
AST (SGOT): 19 U/L (ref 15–37)
Albumin: 4.5 g/dL (ref 3.5–5.0)
Alk. phosphatase: 135 U/L — ABNORMAL HIGH (ref 45–117)
Anion gap: 5 mmol/L (ref 5–15)
BUN/Creatinine ratio: 12 (ref 12–20)
BUN: 9 MG/DL (ref 6–20)
Bilirubin, total: 0.6 MG/DL (ref 0.2–1.0)
CO2: 26 mmol/L (ref 21–32)
Calcium: 9.4 MG/DL (ref 8.5–10.1)
Chloride: 106 mmol/L (ref 97–108)
Creatinine: 0.74 MG/DL (ref 0.55–1.02)
GFR est AA: >60 mL/min/{1.73_m2} (ref >60)
GFR est non-AA: >60 mL/min/{1.73_m2} (ref >60)
Globulin: 3.2 g/dL (ref 2.0–4.0)
Glucose: 104 mg/dL — ABNORMAL HIGH (ref 65–100)
Potassium: 3.9 mmol/L (ref 3.5–5.1)
Protein, total: 7.7 g/dL (ref 6.4–8.2)
Sodium: 137 mmol/L (ref 136–145)

## 2020-09-14 LAB — URINALYSIS W/MICROSCOPIC
Bacteria: NEGATIVE /hpf
Bilirubin: NEGATIVE
Blood: NEGATIVE
Glucose: NEGATIVE mg/dL
Ketone: NEGATIVE mg/dL
Leukocyte Esterase: NEGATIVE
Nitrites: NEGATIVE
Protein: NEGATIVE mg/dL
Specific gravity: 1.02 (ref 1.003–1.030)
Urobilinogen: 0.2 EU/dL (ref 0.2–1.0)
pH (UA): 7 (ref 5.0–8.0)

## 2020-09-14 LAB — CBC WITH AUTOMATED DIFF
ABS. BASOPHILS: 0 10*3/uL (ref 0.0–0.1)
ABS. EOSINOPHILS: 0 10*3/uL (ref 0.0–0.4)
ABS. IMM. GRANS.: 0 10*3/uL (ref 0.00–0.04)
ABS. LYMPHOCYTES: 0.9 10*3/uL (ref 0.8–3.5)
ABS. MONOCYTES: 0.4 10*3/uL (ref 0.0–1.0)
ABS. NEUTROPHILS: 9.2 10*3/uL — ABNORMAL HIGH (ref 1.8–8.0)
ABSOLUTE NRBC: 0 10*3/uL (ref 0.00–0.01)
BASOPHILS: 0 % (ref 0–1)
EOSINOPHILS: 0 % (ref 0–7)
HCT: 44.6 % (ref 35.0–47.0)
HGB: 14.8 g/dL (ref 11.5–16.0)
IMMATURE GRANULOCYTES: 0 % (ref 0.0–0.5)
LYMPHOCYTES: 8 % — ABNORMAL LOW (ref 12–49)
MCH: 27.5 PG (ref 26.0–34.0)
MCHC: 33.2 g/dL (ref 30.0–36.5)
MCV: 82.7 FL (ref 80.0–99.0)
MONOCYTES: 4 % — ABNORMAL LOW (ref 5–13)
MPV: 11.2 FL (ref 8.9–12.9)
NEUTROPHILS: 88 % — ABNORMAL HIGH (ref 32–75)
NRBC: 0 PER 100 WBC
PLATELET: 194 10*3/uL (ref 150–400)
RBC: 5.39 M/uL — ABNORMAL HIGH (ref 3.80–5.20)
RDW: 13.6 % (ref 11.5–14.5)
WBC: 10.5 10*3/uL (ref 3.6–11.0)

## 2020-09-14 LAB — LIPASE
Lipase: 64 U/L — ABNORMAL LOW (ref 73–393)
Lipase: 64 U/L — ABNORMAL LOW (ref 73–393)

## 2020-09-14 LAB — SAMPLES BEING HELD

## 2020-09-14 LAB — HCG URINE, QL. - POC
HCG, Pregnancy, Urine, POC: NEGATIVE
Pregnancy test,urine (POC): NEGATIVE

## 2020-09-14 LAB — URINE CULTURE HOLD SAMPLE

## 2020-09-14 LAB — COMPREHENSIVE METABOLIC PANEL
ALT: 46 U/L (ref 12–78)
AST: 19 U/L (ref 15–37)
Albumin/Globulin Ratio: 1.4 (ref 1.1–2.2)
Albumin: 4.5 g/dL (ref 3.5–5.0)
Alkaline Phosphatase: 135 U/L — ABNORMAL HIGH (ref 45–117)
Anion Gap: 5 mmol/L (ref 5–15)
BUN: 9 MG/DL (ref 6–20)
Bun/Cre Ratio: 12 (ref 12–20)
CO2: 26 mmol/L (ref 21–32)
Calcium: 9.4 MG/DL (ref 8.5–10.1)
Chloride: 106 mmol/L (ref 97–108)
Creatinine: 0.74 MG/DL (ref 0.55–1.02)
EGFR IF NonAfrican American: >60 mL/min/{1.73_m2} (ref >60)
GFR African American: >60 mL/min/{1.73_m2} (ref >60)
Globulin: 3.2 g/dL (ref 2.0–4.0)
Glucose: 104 mg/dL — ABNORMAL HIGH (ref 65–100)
Potassium: 3.9 mmol/L (ref 3.5–5.1)
Sodium: 137 mmol/L (ref 136–145)
Total Bilirubin: 0.6 MG/DL (ref 0.2–1.0)
Total Protein: 7.7 g/dL (ref 6.4–8.2)

## 2020-09-14 LAB — CBC WITH AUTO DIFFERENTIAL
Basophils %: 0 % (ref 0–1)
Basophils Absolute: 0 10*3/uL (ref 0.0–0.1)
Eosinophils %: 0 % (ref 0–7)
Eosinophils Absolute: 0 10*3/uL (ref 0.0–0.4)
Granulocyte Absolute Count: 0 10*3/uL (ref 0.00–0.04)
Hematocrit: 44.6 % (ref 35.0–47.0)
Hemoglobin: 14.8 g/dL (ref 11.5–16.0)
Immature Granulocytes: 0 % (ref 0.0–0.5)
Lymphocytes %: 8 % — ABNORMAL LOW (ref 12–49)
Lymphocytes Absolute: 0.9 10*3/uL (ref 0.8–3.5)
MCH: 27.5 PG (ref 26.0–34.0)
MCHC: 33.2 g/dL (ref 30.0–36.5)
MCV: 82.7 FL (ref 80.0–99.0)
MPV: 11.2 FL (ref 8.9–12.9)
Monocytes %: 4 % — ABNORMAL LOW (ref 5–13)
Monocytes Absolute: 0.4 10*3/uL (ref 0.0–1.0)
NRBC Absolute: 0 10*3/uL (ref 0.00–0.01)
Neutrophils %: 88 % — ABNORMAL HIGH (ref 32–75)
Neutrophils Absolute: 9.2 10*3/uL — ABNORMAL HIGH (ref 1.8–8.0)
Nucleated RBCs: 0 PER 100 WBC
Platelets: 194 10*3/uL (ref 150–400)
RBC: 5.39 M/uL — ABNORMAL HIGH (ref 3.80–5.20)
RDW: 13.6 % (ref 11.5–14.5)
WBC: 10.5 10*3/uL (ref 3.6–11.0)

## 2020-09-14 LAB — URINALYSIS WITH MICROSCOPIC
BACTERIA, URINE: NEGATIVE /hpf
Bilirubin, Urine: NEGATIVE
Blood, Urine: NEGATIVE
Glucose, Ur: NEGATIVE mg/dL
Ketones, Urine: NEGATIVE mg/dL
Leukocyte Esterase, Urine: NEGATIVE
Nitrite, Urine: NEGATIVE
Protein, UA: NEGATIVE mg/dL
Specific Gravity, UA: 1.02 (ref 1.003–1.030)
Urobilinogen, UA, POCT: 0.2 EU/dL (ref 0.2–1.0)
pH, UA: 7 (ref 5.0–8.0)

## 2020-09-14 MED ORDER — ONDANSETRON HCL 4 MG TAB
4 mg | ORAL_TABLET | Freq: Three times a day (TID) | ORAL | 0 refills | Status: AC | PRN
Start: 2020-09-14 — End: ?

## 2020-09-14 MED ORDER — BUTALBITAL-ACETAMINOPHEN-CAFFEINE 50 MG-300 MG-40 MG CAPSULE
50-300-40 mg | ORAL_CAPSULE | Freq: Four times a day (QID) | ORAL | 0 refills | Status: AC | PRN
Start: 2020-09-14 — End: ?

## 2020-09-14 MED ORDER — SODIUM CHLORIDE 0.9% BOLUS IV
0.9 % | Freq: Once | INTRAVENOUS | Status: AC
Start: 2020-09-14 — End: 2020-09-14
  Administered 2020-09-14: 16:00:00 via INTRAVENOUS

## 2020-09-14 MED ORDER — BUTALBITAL-ACETAMINOPHEN-CAFFEINE 50 MG-325 MG-40 MG TAB
50-325-40 mg | ORAL | Status: AC
Start: 2020-09-14 — End: 2020-09-14
  Administered 2020-09-14: 19:00:00 via ORAL

## 2020-09-14 MED ORDER — LISINOPRIL 10 MG TAB
10 mg | Freq: Once | ORAL | Status: AC
Start: 2020-09-14 — End: 2020-09-14
  Administered 2020-09-14: 16:00:00 via ORAL

## 2020-09-14 MED ORDER — SODIUM CHLORIDE 0.9% BOLUS IV
0.9 % | Freq: Once | INTRAVENOUS | Status: AC
Start: 2020-09-14 — End: 2020-09-14
  Administered 2020-09-14: 17:00:00 via INTRAVENOUS

## 2020-09-14 MED ORDER — LISINOPRIL 10 MG TAB
10 mg | ORAL_TABLET | Freq: Every day | ORAL | 0 refills | Status: AC
Start: 2020-09-14 — End: 2020-10-14

## 2020-09-14 MED ORDER — ONDANSETRON (PF) 4 MG/2 ML INJECTION
4 mg/2 mL | INTRAMUSCULAR | Status: AC
Start: 2020-09-14 — End: 2020-09-14
  Administered 2020-09-14: 19:00:00 via INTRAVENOUS

## 2020-09-14 MED FILL — BUTALBITAL-ACETAMINOPHEN-CAFFEINE 50 MG-325 MG-40 MG TAB: 50-325-40 mg | ORAL | Qty: 1

## 2020-09-14 MED FILL — ONDANSETRON (PF) 4 MG/2 ML INJECTION: 4 mg/2 mL | INTRAMUSCULAR | Qty: 2

## 2020-09-14 MED FILL — SODIUM CHLORIDE 0.9 % IV: INTRAVENOUS | Qty: 1000

## 2020-09-14 MED FILL — LISINOPRIL 10 MG TAB: 10 mg | ORAL | Qty: 1

## 2020-09-14 NOTE — ED Notes (Signed)
 Pt ambulatory to ED with c/o hypertension and headache onset yesterday. Pt reports I didn't take prescribed medicine for BP today because I thought it would dehydrate me and I already have a headache. Prescribed HCTZ 12.5 mg daily.
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
## 2020-09-14 NOTE — ED Notes (Signed)
Discharge instructions given to patient by MD and RN. Pt has been given counseling on medication use and verbalizes understanding. IV D/C. Pt ambulated off of unit in no signs of distress.

## 2020-09-14 NOTE — ED Notes (Signed)
Pt aware of need to provide urine sample 

## 2020-09-20 ENCOUNTER — Encounter: Payer: Self-pay | Admitting: Family Medicine

## 2020-09-20 ENCOUNTER — Ambulatory Visit (INDEPENDENT_AMBULATORY_CARE_PROVIDER_SITE_OTHER): Payer: Medicaid Other | Admitting: Family Medicine

## 2020-09-20 DIAGNOSIS — M25562 Pain in left knee: Secondary | ICD-10-CM

## 2020-09-20 DIAGNOSIS — M5030 Other cervical disc degeneration, unspecified cervical region: Secondary | ICD-10-CM | POA: Diagnosis not present

## 2020-09-20 DIAGNOSIS — M5136 Other intervertebral disc degeneration, lumbar region: Secondary | ICD-10-CM | POA: Diagnosis not present

## 2020-09-20 DIAGNOSIS — G8929 Other chronic pain: Secondary | ICD-10-CM

## 2020-09-20 DIAGNOSIS — M25561 Pain in right knee: Secondary | ICD-10-CM
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
## 2020-09-23 ENCOUNTER — Other Ambulatory Visit: Payer: Self-pay | Admitting: Family Medicine

## 2020-09-23 ENCOUNTER — Other Ambulatory Visit (HOSPITAL_COMMUNITY)
Admission: RE | Admit: 2020-09-23 | Discharge: 2020-09-23 | Disposition: A | Discharge status: Home / Self Care | Payer: Medicaid Other | Source: Ambulatory Visit | Attending: Primary Care | Admitting: Primary Care

## 2020-09-23 DIAGNOSIS — Z20822 Contact with and (suspected) exposure to covid-19: Secondary | ICD-10-CM | POA: Insufficient documentation

## 2020-09-23 DIAGNOSIS — Z01812 Encounter for preprocedural laboratory examination: Secondary | ICD-10-CM | POA: Insufficient documentation

## 2020-09-24 LAB — SARS CORONAVIRUS 2 (TAT 6-24 HRS): SARS Coronavirus 2: NEGATIVE

## 2020-09-26 ENCOUNTER — Ambulatory Visit (INDEPENDENT_AMBULATORY_CARE_PROVIDER_SITE_OTHER): Payer: Medicaid Other | Admitting: Pulmonary Disease

## 2020-09-26 ENCOUNTER — Other Ambulatory Visit: Payer: Self-pay

## 2020-09-26 ENCOUNTER — Ambulatory Visit: Payer: Medicaid Other | Admitting: Primary Care

## 2020-09-26 ENCOUNTER — Encounter: Payer: Self-pay | Admitting: Primary Care

## 2020-09-26 VITALS — BP 128/88 | HR 107 | Temp 97.3°F | Ht 68.0 in | Wt 303.6 lb

## 2020-09-26 DIAGNOSIS — J4540 Moderate persistent asthma, uncomplicated: Secondary | ICD-10-CM | POA: Diagnosis not present

## 2020-09-26 DIAGNOSIS — J4521 Mild intermittent asthma with (acute) exacerbation: Secondary | ICD-10-CM

## 2020-09-26 LAB — PULMONARY FUNCTION TEST
DL/VA % pred: 127 %
DL/VA: 5.33 ml/min/mmHg/L
DLCO cor % pred: 89 %
DLCO cor: 21.53 ml/min/mmHg
DLCO unc % pred: 89 %
DLCO unc: 21.53 ml/min/mmHg
FEF 25-75 Post: 3.05 L/sec
FEF 25-75 Pre: 3.64 L/sec
FEF2575-%Change-Post: -16 %
FEF2575-%Pred-Post: 101 %
FEF2575-%Pred-Pre: 121 %
FEV1-%Change-Post: -3 %
FEV1-%Pred-Post: 72 %
FEV1-%Pred-Pre: 75 %
FEV1-Post: 2.33 L
FEV1-Pre: 2.4 L
FEV1FVC-%Change-Post: 2 %
FEV1FVC-%Pred-Pre: 109 %
FEV6-%Change-Post: -4 %
FEV6-%Pred-Post: 66 %
FEV6-%Pred-Pre: 69 %
FEV6-Post: 2.6 L
FEV6-Pre: 2.72 L
FEV6FVC-%Pred-Post: 102 %
FEV6FVC-%Pred-Pre: 102 %
FVC-%Change-Post: -5 %
FVC-%Pred-Post: 64 %
FVC-%Pred-Pre: 67 %
FVC-Post: 2.6 L
FVC-Pre: 2.75 L
Post FEV1/FVC ratio: 89 %
Post FEV6/FVC ratio: 100 %
Pre FEV1/FVC ratio: 87 %
Pre FEV6/FVC Ratio: 100 %
RV % pred: 59 %
RV: 1.18 L
TLC % pred: 67 %
TLC: 3.84 L

## 2020-09-26 LAB — POCT EXHALED NITRIC OXIDE: FeNO level (ppb): 16

## 2020-09-26 MED ORDER — ALBUTEROL SULFATE (2.5 MG/3ML) 0.083% IN NEBU: 2.5000 mg | INHALATION_SOLUTION | Freq: Four times a day (QID) | RESPIRATORY_TRACT | 12 refills | Status: DC | PRN

## 2020-09-26 MED ORDER — ALBUTEROL SULFATE HFA 108 (90 BASE) MCG/ACT IN AERS: 2.0000 | INHALATION_SPRAY | Freq: Four times a day (QID) | RESPIRATORY_TRACT | 1 refills | Status: DC | PRN

## 2020-09-26 MED ORDER — BUDESONIDE-FORMOTEROL FUMARATE 160-4.5 MCG/ACT IN AERO: 2.0000 | INHALATION_SPRAY | Freq: Two times a day (BID) | RESPIRATORY_TRACT | 3 refills | Status: DC

## 2020-09-26 NOTE — Progress Notes (Signed)
$'@Patient'Z$  ID: [name], female    DOB: [birth year], 50 y.o.   MRN: [id number]  Chief Complaint  Patient presents with  . Follow-up    Dry cough, easily SOB with wheezing    Referring provider: Janora Norlander, DO  HPI: 50 year old female, former smoker quit November 1994 (5-pack-year history).  Past medical history significant for moderate persistent asthma. Patient of Dr. Silas Flood, seen for initial consult in November 2021.   Previous [facility] pulmonary encounter: 04/04/20- Dr. Silas Flood, Consult  Ms. Reiger is a 51 year old woman with past my history of asthma whom are seen in consultation at the request of Adam Phenix, DO for evaluation of chronic bronchitis and asthma.  Notes from referring provider reviewed.  Patient had asthma for very long time.  She is had recurrent bouts of bronchitis throughout her life.  These are characterized by increased cough, shortness of breath.  Usually cough is productive of sputum.  In general, she thinks her asthma is well controlled on high-dose Symbicort and montelukast.  However she does have these intermittent episodes of uncontrolled coughing and shortness of breath.  Usually there adequately treated with steroid injection.  She notes that she cannot tolerate oral prednisone due to nausea and vomiting.  She is needing a couple of steroid injections over the last couple of months.  She thinks the frequency of these bouts are a bit worse over the last 6 months to 1 year.  No clear trigger.  Does not seem to be exacerbated or set off by viral infection.  Denies significant seasonal allergies to suggest seasonal change.  Review of prior images demonstrates relatively newly discovered small hiatal hernia.  She denies heartburn but does take PPI for this.  Review of labs indicate elevated eosinophils of 300 in the past.  Most recent chest x-ray 01/22/2020 personally reviewed interpreted as clear lungs.  PMH: Asthma, diabetes, hyperlipidemia,  migraines, sleep apnea Surgical history: Cholecystectomy in 2008 Family history: Allergies, asthma, heart disease run in her family Social history: Former smoker quit in 1994, 5-pack-year smoking history, works at [employer] on [address] [employer] at the old [place].   09/26/2020 - Interim hx  Patient presents today for 6 month follow-up with PFTs. She gets out of breath easily, she can not walk full length of grocery store. Associated intermittent wheezing and dry cough. Symbicort helped temporarily but stopped working. Her main issues is when she gets a cold or bronchitis. When she was in [place] she had no issues. She has lost 7 lbs since her last visit in November. She is taking dexilant for GERD and reports her symtpoms are well vontrol. She needs to reschedule her endoscopy.   Pulmonary function testing 09/26/2020 -FVC 2.60 (64%), FEV1 2.33 (72%), ratio 89, TLC 67%, DLCOunc 21.53 (89%) Mild to moderate restriction without bronchodilator response   Questionaires / Pulmonary Flowsheets:  ACT:  Asthma Control Test ACT Total Score  04/04/2020 9    Allergies  Allergen Reactions  . Diphenhydramine Other (See Comments)    Hair feels like it is crawling  . Morphine Nausea And Vomiting    PCA PUMP/ DRIP -- N/V IV PUSH IN ER NO PROBLEM PER PT.  . Prednisone Nausea And Vomiting  . Adhesive [Tape]     Paper tape causes irritation   . Erythromycin Hives  . Metformin And Related     Diarrhea with IR and XR products  . Sulfonamide Derivatives Other (See Comments)    Does not take due to  family history    Immunization History  Administered Date(s) Administered  . Influenza, Seasonal, Injecte, Preservative Fre 02/09/2018  . Influenza,inj,Quad PF,6+ Mos 02/14/2017, 02/20/2019  . Influenza-Unspecified 02/05/2020  . MMR 02/03/1971  . Moderna SARS-COV2 Booster Vaccination 06/05/2020  . Moderna Sars-Covid-2 Vaccination 10/02/2019, 11/01/2019, 04/18/2020  . Pneumococcal  Polysaccharide-23 02/20/2019, 11/01/2019  . Td 03/03/1985, 06/30/1994  . Tdap 12/24/2014, 08/15/2019  . Zoster Recombinat (Shingrix) 02/05/2020    Past Medical History:  Diagnosis Date  . Allergy   . Anxiety   . Arthritis   . Asthma   . Bipolar 1 disorder ([place])   . Blood transfusion without reported diagnosis    with child birth 12 yrs ago   . Bronchitis, chronic ([place])   . Constipation   . COVID-19 virus infection 06/21/2020  . DDD (degenerative disc disease), cervical   . Depression   . Diabetes mellitus without complication ([place])   . Dysphagia   . Gastroparesis   . GERD (gastroesophageal reflux disease)   . History of MRSA infection   . Hyperlipemia   . Hypothyroid   . Migraines   . Sleep apnea    no cpap     Tobacco History: Social History   Tobacco Use  Smoking Status Former Smoker  . Packs/day: 1.00  . Years: 5.00  . Pack years: 5.00  . Quit date: 04/04/1993  . Years since quitting: 27.5  Smokeless Tobacco Never Used  Tobacco Comment   smoked when went out with friends/social smoker. pack would last a week.   Counseling given: Yes Comment: smoked when went out with friends/social smoker. pack would last a week.   Outpatient Medications Prior to Visit  Medication Sig Dispense Refill  . Accu-Chek FastClix Lancets MISC Test blood sugars daily Dx E11.9 102 each 3  . Blood Glucose Monitoring Suppl (ACCU-CHEK GUIDE ME) w/Device KIT Use to check BS BID and as needed. DX: E11.9 1 kit 0  . Continuous Blood Gluc Receiver (FREESTYLE LIBRE READER) DEVI 1 Units by Does not apply route daily. UAD to test BGs daily. Dx E11.40 1 each 1  . Continuous Blood Gluc Sensor (FREESTYLE LIBRE 14 DAY SENSOR) MISC 1 Units by Does not apply route daily. UAD to check blood sugars R73.09 2 each 12  . cyclobenzaprine (FLEXERIL) 10 MG tablet Take 1 tablet (10 mg total) by mouth 3 (three) times daily as needed for muscle spasms. (Patient taking differently: Take 10 mg by mouth 3 (three)  times daily.) 60 tablet 5  . dexlansoprazole (DEXILANT) 60 MG capsule TAKE 1 CAPSULE BY MOUTH EVERY DAY (Patient taking differently: Take 60 mg by mouth daily.) 30 capsule 3  . diclofenac Sodium (VOLTAREN) 1 % GEL Apply 4 g topically 4 (four) times daily. For knee pain (Patient taking differently: Apply 4 g topically 4 (four) times daily as needed (knee pain).) 400 g 2  . dicyclomine (BENTYL) 20 MG tablet TAKE 1 TABLET (20 MG TOTAL) BY MOUTH EVERY 6 (SIX) HOURS. (Patient taking differently: Take 20 mg by mouth every 6 (six) hours as needed for spasms (abdominal cramps).) 360 tablet 0  . gabapentin (NEURONTIN) 300 MG capsule Take 1 capsule (300 mg total) by mouth at bedtime for 3 days, THEN 1 capsule (300 mg total) 2 (two) times daily for 3 days, THEN 1 capsule (300 mg total) 3 (three) times daily. 90 capsule 3  . glucose blood (ACCU-CHEK GUIDE) test strip Use as instructed E11.9 100 strip 12  . hydrOXYzine (ATARAX/VISTARIL) 25 MG tablet  TAKE 1 TABLET BY MOUTH THREE TIMES A DAY (Patient taking differently: Take 25 mg by mouth every 8 (eight) hours as needed for itching or anxiety.) 270 tablet 1  . insulin degludec (TRESIBA FLEXTOUCH) 200 UNIT/ML FlexTouch Pen Inject 66 Units into the skin at bedtime. (Patient taking differently: Inject 40-60 Units into the skin See admin instructions. At bedtime, 40-60 units per sliding scale.) 15 mL 3  . Insulin Pen Needle 31G X 5 MM MISC 1 Units by Does not apply route daily. 100 each 5  . lamoTRIgine (LAMICTAL) 150 MG tablet Take 1 tablet (150 mg total) by mouth 2 (two) times daily. 60 tablet 2  . levothyroxine (SYNTHROID) 125 MCG tablet Take 1 tablet (125 mcg total) by mouth daily. (Patient taking differently: Take 125 mcg by mouth at bedtime.) 90 tablet 1  . linaclotide (LINZESS) 72 MCG capsule Take 1 capsule (72 mcg total) by mouth daily before breakfast. (Patient taking differently: Take 72 mcg by mouth daily as needed (constipation).) 30 capsule 2  .  lisdexamfetamine (VYVANSE) 70 MG capsule Take 1 capsule (70 mg total) by mouth daily. 30 capsule 0  . [START ON 10/26/2020] lisdexamfetamine (VYVANSE) 70 MG capsule Take 1 capsule (70 mg total) by mouth daily. 30 capsule 0  . lisdexamfetamine (VYVANSE) 70 MG capsule Take 1 capsule (70 mg total) by mouth daily. 30 capsule 0  . medroxyPROGESTERone (DEPO-PROVERA) 150 MG/ML injection Inject 1 mL (150 mg total) into the muscle every 3 (three) months. 1 mL 3  . montelukast (SINGULAIR) 10 MG tablet TAKE ONE TAB BY MOUTH AT BEDTIME (Patient taking differently: Take 10 mg by mouth at bedtime.) 90 tablet 3  . nystatin cream (MYCOSTATIN) Apply 1 application topically 2 (two) times daily. x2 weeks (Patient taking differently: Apply 1 application topically 2 (two) times daily as needed (rash).) 60 g 0  . ondansetron (ZOFRAN) 4 MG tablet Take 1 tablet (4 mg total) by mouth every 8 (eight) hours as needed for nausea or vomiting. 20 tablet 0  . pioglitazone (ACTOS) 30 MG tablet Take 1 tablet (30 mg total) by mouth daily. (Patient taking differently: Take 30 mg by mouth at bedtime.) 90 tablet 3  . promethazine (PHENERGAN) 25 MG tablet Take 1 tablet (25 mg total) by mouth every 6 (six) hours as needed for nausea or vomiting. 30 tablet 0  . rosuvastatin (CRESTOR) 40 MG tablet Take 1 tablet (40 mg total) by mouth daily. 90 tablet 3  . Semaglutide (RYBELSUS) 14 MG TABS Take 1 tablet by mouth daily before breakfast. (Patient taking differently: Take 14 mg by mouth daily before breakfast.) 30 tablet prn  . sertraline (ZOLOFT) 100 MG tablet Take 2 tablets (200 mg total) by mouth daily. (Patient taking differently: Take 200 mg by mouth at bedtime.) 180 tablet 3  . albuterol (PROVENTIL) (2.5 MG/3ML) 0.083% nebulizer solution Take 3 mLs (2.5 mg total) by nebulization every 6 (six) hours as needed for wheezing or shortness of breath. 75 mL 12  . albuterol (VENTOLIN HFA) 108 (90 Base) MCG/ACT inhaler Inhale 2 puffs into the lungs  every 6 (six) hours as needed for wheezing or shortness of breath. 8 g 1  . ibuprofen (ADVIL) 600 MG tablet Take 1 tablet (600 mg total) by mouth every 8 (eight) hours as needed. (Patient taking differently: Take 600 mg by mouth every 8 (eight) hours as needed for moderate pain.) 60 tablet 1  . traMADol (ULTRAM) 50 MG tablet Take 1 tablet (50 mg total) by mouth  every 8 (eight) hours as needed for severe pain. 90 tablet 5   Facility-Administered Medications Prior to Visit  Medication Dose Route Frequency Provider Last Rate Last Admin  . medroxyPROGESTERone (DEPO-PROVERA) injection 150 mg  150 mg Intramuscular Q90 days Ronnie Doss M, DO   150 mg at 09/10/20 6812    Review of Systems  Review of Systems  Constitutional: Negative.   HENT: Negative.   Respiratory: Positive for shortness of breath. Negative for cough, chest tightness and wheezing.   Cardiovascular: Negative.    Physical Exam  BP 128/88 (BP Location: Right Wrist, Cuff Size: Normal)   Pulse (!) 107   Temp (!) 97.3 F (36.3 C) (Oral)   Ht $R'5\' 8"'zQ$  (1.727 m)   Wt (!) 303 lb 9.6 oz (137.7 kg)   SpO2 97%   BMI 46.16 kg/m  Physical Exam Constitutional:      Appearance: Normal appearance.  HENT:     Head: Normocephalic and atraumatic.  Cardiovascular:     Rate and Rhythm: Normal rate and regular rhythm.  Pulmonary:     Effort: Pulmonary effort is normal.     Breath sounds: Normal breath sounds.     Comments: CTA Neurological:     General: No focal deficit present.     Mental Status: She is alert and oriented to person, place, and time. Mental status is at baseline.  Psychiatric:        Mood and Affect: Mood normal.        Behavior: Behavior normal.        Thought Content: Thought content normal.        Judgment: Judgment normal.      Lab Results:  CBC    Component Value Date/Time   WBC 7.7 04/04/2020 1155   RBC 5.17 (H) 04/04/2020 1155   HGB 13.7 04/04/2020 1155   HGB 14.4 12/01/2019 1202   HCT 41.9  04/04/2020 1155   HCT 45.6 12/01/2019 1202   PLT 305.0 04/04/2020 1155   PLT 372 12/01/2019 1202   MCV 80.9 04/04/2020 1155   MCV 83 12/01/2019 1202   MCH 26.2 (L) 12/01/2019 1202   MCH 26.6 11/02/2018 2043   MCHC 32.7 04/04/2020 1155   RDW 13.8 04/04/2020 1155   RDW 13.5 12/01/2019 1202   LYMPHSABS 2.2 04/04/2020 1155   LYMPHSABS 2.8 12/01/2019 1202   MONOABS 0.7 04/04/2020 1155   EOSABS 0.1 04/04/2020 1155   EOSABS 0.2 12/01/2019 1202   BASOSABS 0.1 04/04/2020 1155   BASOSABS 0.1 12/01/2019 1202    BMET    Component Value Date/Time   NA 141 09/10/2020 0836   K 4.2 09/10/2020 0836   CL 99 09/10/2020 0836   CO2 25 09/10/2020 0836   GLUCOSE 125 (H) 09/10/2020 0836   GLUCOSE 550 (HH) 11/02/2018 2043   BUN 13 09/10/2020 0836   CREATININE 1.02 (H) 09/10/2020 0836   CALCIUM 9.6 09/10/2020 0836   GFRNONAA 81 07/04/2020 0846   GFRAA 94 07/04/2020 0846    BNP No results found for: BNP  ProBNP    Component Value Date/Time   PROBNP 11.8 07/28/2010 1115    Imaging: No results found.   Assessment & Plan:   Asthma  - Patient reports dyspnea symptoms with exertion. Associated intermittent wheezing and dry cough. Minimal improvement with Symbicort. Her symptoms improve in other environments such as [place]  - Pulmonary function testing today showed mild to moderate restriction without bronchodilator response/ FEV1 2.33 (72%), ratio 89. IgE 44 in November  2021 - Continue Symbicort 143mcg two puffs twice daily and Singulair $RemoveBefor'10mg'[id number]$  qhs - Dr. Silas Flood has recommended patient be started on Nucala  - Encourage weight loss efforts as well   Martyn Ehrich, NP 09/30/2020

## 2020-09-26 NOTE — Patient Instructions (Addendum)
Breathing test today showed moderate restriction in lung capacity. You had no improvement after bronchodilator. No evidence of COPD. FENO was normal. Restriction can be attributed to weight and/or interimittent asthma  Recommendations: - Use Symbicort 160 two puffs twice daily as needed when acutely sick - Continue Singulair 10mg  at bedtime  - Continue to work on weight loss efforts - Start exercise regiment (20-84min cardiovascular exercise 4-5 times a week)   Orders: FENO was  Normal   Follow-up: 6 months with Dr. Silas Flood or sooner if needed

## 2020-09-26 NOTE — Progress Notes (Signed)
PFT done today. 

## 2020-09-27 NOTE — Telephone Encounter (Signed)
Judith [name] can you look at her PFTs. Her FENO was normal. PFTs showed restriction without BD response. I was not aware of biologic, not sure she would benefit. She is not on ICS/LABA d/t no perceived benefit. She experiences asthma symptoms only when sick.

## 2020-09-28 NOTE — Telephone Encounter (Signed)
Admittedly, I am not sure. The argument for biologic is reducing steroid burden, exacerbations. If biologic does not help reduce burden of exacerbations, would stop the medicine after a period of time. There are likely other contributors to her symptoms but none easily or readily modifiable. Her symptoms improved in other environment ([place]) along with her reported history of asthma is enough evidence for a trial, I think.

## 2020-09-30 ENCOUNTER — Telehealth: Payer: Self-pay | Admitting: Primary Care

## 2020-09-30 NOTE — Telephone Encounter (Signed)
Called and spoke with pt letting her know the info stated by [facility] after speaking with Dr. Silas Flood and she verbalized understanding. Pt said that she did not need an Rx of Singulair or Symbicort sent in at this time.nothing further needed.

## 2020-09-30 NOTE — Assessment & Plan Note (Addendum)
-   Patient reports dyspnea symptoms with exertion. Associated intermittent wheezing and dry cough. Minimal improvement with Symbicort. Her symptoms improve in other environments such as [place]  - Pulmonary function testing today showed mild to moderate restriction without bronchodilator response/ FEV1 2.33 (72%), ratio 89. IgE 44 in November 2021 - Continue Symbicort 142mcg two puffs twice daily and Singulair 10mg  qhs - Dr. Silas Flood has recommended patient be started on Nucala  - Encourage weight loss efforts as well

## 2020-09-30 NOTE — Telephone Encounter (Signed)
Ok, sounds good.

## 2020-09-30 NOTE — Telephone Encounter (Signed)
Can you please let patient know [facility] would like her to continue Symbicort 164mcg twice daily and Singulair 10mg  qhs as maintenance (if she needs RX we can send in) and we are in the process of getting her approved for Nucala which is a biologic for her asthma.  He would like to try this medication due hx asthma and the fact that her symptoms improve in other environments. Our pharmacist should be getting in contact with her. She should follow-up with Dr. Silas Flood in 3 months if not already scheduled

## 2020-10-01 ENCOUNTER — Other Ambulatory Visit: Payer: Self-pay

## 2020-10-01 ENCOUNTER — Emergency Department
Admission: RE | Admit: 2020-10-01 | Discharge: 2020-10-01 | Disposition: A | Discharge status: Home / Self Care | Payer: Medicaid Other | Source: Ambulatory Visit | Attending: Family Medicine | Admitting: Family Medicine

## 2020-10-01 VITALS — BP 133/81 | HR 113 | Temp 98.9°F | Resp 17

## 2020-10-01 DIAGNOSIS — J4530 Mild persistent asthma, uncomplicated: Secondary | ICD-10-CM | POA: Diagnosis not present

## 2020-10-01 MED ORDER — DOXYCYCLINE HYCLATE 100 MG PO CAPS: ORAL_CAPSULE | ORAL | 0 refills | Status: DC

## 2020-10-01 MED ORDER — METHYLPREDNISOLONE ACETATE 80 MG/ML IJ SUSP
80.0000 mg | Freq: Once | INTRAMUSCULAR | Status: AC
  Administered 2020-10-01: 80 mg via INTRAMUSCULAR

## 2020-10-01 MED ORDER — GUAIFENESIN-CODEINE 100-10 MG/5ML PO SOLN: ORAL | 0 refills | Status: DC

## 2020-10-01 NOTE — Telephone Encounter (Signed)
Submitted PA request to [facility] for Judith [name]. Will update after receiving determination.  Confirmation# [id number] W

## 2020-10-01 NOTE — ED Triage Notes (Signed)
Pt c/o dry cough x 1 week. Worsening in last couple days. Wheezing and chest tightness. Hx of asthma and chronic bronchitis. Singulair and Symbicort daily. Also uses nebulizer prn.

## 2020-10-01 NOTE — ED Provider Notes (Signed)
[name]    CSN: [id number] Arrival date & time: 10/01/20  1057      History   Chief Complaint Chief Complaint  Patient presents with  . Cough  . Hoarse    HPI Judith [name] is a 50 y.o. female.   Patient developed a dry cough about one week ago, now worse.  During the past two days she has developed increased chest tightness and wheezing.  She has asthma and has increased the use of her albuterol inhaler and nebulizer.  She also uses Symbicort and Singulair daily.  She denies pleuritic pain and fevers, chills, and sweats.  She had a negative COVID19 test one week ago.  She has a history of chronic bronchitis.  The history is provided by the patient.    Past Medical History:  Diagnosis Date  . Allergy   . Anxiety   . Arthritis   . Asthma   . Bipolar 1 disorder ([place])   . Blood transfusion without reported diagnosis    with child birth 49 yrs ago   . Bronchitis, chronic ([place])   . Constipation   . COVID-19 virus infection 06/21/2020  . DDD (degenerative disc disease), cervical   . Depression   . Diabetes mellitus without complication ([place])   . Dysphagia   . Gastroparesis   . GERD (gastroesophageal reflux disease)   . History of MRSA infection   . Hyperlipemia   . Hypothyroid   . Migraines   . Sleep apnea    no cpap     Patient Active Problem List   Diagnosis Date Noted  . Esophageal dysphagia   . Patellofemoral pain syndrome of both knees 04/19/2020  . Tinnitus of right ear 03/15/2020  . Closed fracture of one rib of left side 03/15/2020  . Concussion with no loss of consciousness 02/05/2020  . Spondylosis of cervical spine 02/05/2020  . Acute bilateral thoracic back pain 02/05/2020  . Acute bilateral low back pain without sciatica 02/05/2020  . Gastroparesis 11/03/2019  . Hospital discharge follow-up 11/03/2019  . DDD (degenerative disc disease), lumbar 06/30/2019  . Lumbar back pain with radiculopathy affecting lower extremity 06/20/2019   . 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/21/2019  . History of left knee replacement 02/10/2018  . Morbid obesity ([place]) 11/21/2017  . 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 Episodic mood disorder ([place]) 05/12/2016  . Controlled diabetes mellitus type 2 with complications ([place]) 67/04/4579  . Gastroesophageal reflux disease without esophagitis 05/12/2016  . DYSPNEA 07/22/2010  . CHEST PAIN 07/22/2010  . Hypothyroidism 09/13/2007  . Hyperlipidemia 09/13/2007  . Depression, major, single episode, moderate ([place]) 09/13/2007  . MIGRAINE HEADACHE 09/13/2007  . Essential hypertension 09/13/2007  . Asthma 09/13/2007  . CHOLELITHIASIS, HX OF 09/13/2007  . NEPHROLITHIASIS, HX OF 09/13/2007  . CARPAL TUNNEL RELEASE, HX OF 09/13/2007    Past Surgical History:  Procedure Laterality Date  . ABDOMINAL SURGERY    . CARPAL TUNNEL RELEASE Bilateral 1999  . CESAREAN SECTION    . CHOLECYSTECTOMY    . COLONOSCOPY    . ESOPHAGEAL MANOMETRY N/A 07/08/2020   Procedure: ESOPHAGEAL MANOMETRY (EM);  Surgeon: Yetta Flock, MD;  Location: [facility];  Service: Gastroenterology;  Laterality: N/A;  . REPLACEMENT TOTAL KNEE Bilateral   . THYROIDECTOMY    . TONSILLECTOMY AND ADENOIDECTOMY    . UPPER GASTROINTESTINAL ENDOSCOPY      OB History    Gravida  1   Para  1  Term  1   Preterm      AB      Living        SAB      IAB      Ectopic      Multiple      Live Births               Home Medications    Prior to Admission medications   Medication Sig Start Date End Date Taking? Authorizing Provider  doxycycline (VIBRAMYCIN) 100 MG capsule Take one cap PO Q12hr with food. 10/01/20  Yes Kandra Nicolas, MD  guaiFENesin-codeine 100-10 MG/5ML syrup Take 45m by mouth at bedtime as needed for cough. 10/01/20  Yes BKandra Nicolas MD  Accu-Chek FastClix Lancets MISC Test blood sugars daily Dx E11.9 06/25/20   GRonnie DossM, DO  albuterol  (PROVENTIL) (2.5 MG/3ML) 0.083% nebulizer solution Take 3 mLs (2.5 mg total) by nebulization every 6 (six) hours as needed for wheezing or shortness of breath. Fill upon patient request 09/26/20   WMartyn Ehrich NP  albuterol (VENTOLIN HFA) 108 (90 Base) MCG/ACT inhaler Inhale 2 puffs into the lungs every 6 (six) hours as needed for wheezing or shortness of breath. Fill upon patient request 09/26/20   WMartyn Ehrich NP  Blood Glucose Monitoring Suppl (ACCU-CHEK GUIDE ME) w/Device KIT Use to check BS BID and as needed. DX: E11.9 06/25/20   GRonnie DossM, DO  budesonide-formoterol (SYMBICORT) 160-4.5 MCG/ACT inhaler Inhale 2 puffs into the lungs 2 (two) times daily. 09/26/20   WMartyn Ehrich NP  Continuous Blood Gluc Receiver (FREESTYLE LIBRE READER) DEVI 1 Units by Does not apply route daily. UAD to test BGs daily. Dx E11.40 07/08/20   GJanora Norlander DO  Continuous Blood Gluc Sensor (FREESTYLE LIBRE 14 DAY SENSOR) MISC 1 Units by Does not apply route daily. UAD to check blood sugars R73.09 07/08/20   GRonnie DossM, DO  cyclobenzaprine (FLEXERIL) 10 MG tablet Take 1 tablet (10 mg total) by mouth 3 (three) times daily as needed for muscle spasms. Patient taking differently: Take 10 mg by mouth 3 (three) times daily. 09/19/19   HEvelina DunA, FNP  dexlansoprazole (DEXILANT) 60 MG capsule TAKE 1 CAPSULE BY MOUTH EVERY DAY Patient taking differently: Take 60 mg by mouth daily. 08/15/20   LLevin Erp PA  diclofenac Sodium (VOLTAREN) 1 % GEL Apply 4 g topically 4 (four) times daily. For knee pain Patient taking differently: Apply 4 g topically 4 (four) times daily as needed (knee pain). 10/20/19   GJanora Norlander DO  dicyclomine (BENTYL) 20 MG tablet TAKE 1 TABLET (20 MG TOTAL) BY MOUTH EVERY 6 (SIX) HOURS. Patient taking differently: Take 20 mg by mouth every 6 (six) hours as needed for spasms (abdominal cramps). 08/07/20   GJanora Norlander DO  gabapentin  (NEURONTIN) 300 MG capsule Take 1 capsule (300 mg total) by mouth at bedtime for 3 days, THEN 1 capsule (300 mg total) 2 (two) times daily for 3 days, THEN 1 capsule (300 mg total) 3 (three) times daily. 08/27/20 12/01/20  GRonnie DossM, DO  glucose blood (ACCU-CHEK GUIDE) test strip Use as instructed E11.9 06/25/20   GRonnie DossM, DO  hydrOXYzine (ATARAX/VISTARIL) 25 MG tablet TAKE 1 TABLET BY MOUTH THREE TIMES A DAY Patient taking differently: Take 25 mg by mouth every 8 (eight) hours as needed for itching or anxiety. 08/08/20   GJanora Norlander DO  insulin  degludec (TRESIBA FLEXTOUCH) 200 UNIT/ML FlexTouch Pen Inject 66 Units into the skin at bedtime. Patient taking differently: Inject 40-60 Units into the skin See admin instructions. At bedtime, 40-60 units per sliding scale. 04/02/20   Janora Norlander, DO  Insulin Pen Needle 31G X 5 MM MISC 1 Units by Does not apply route daily. 01/19/20   Janora Norlander, DO  lamoTRIgine (LAMICTAL) 150 MG tablet Take 1 tablet (150 mg total) by mouth 2 (two) times daily. 09/19/19   Sharion Balloon, FNP  levothyroxine (SYNTHROID) 125 MCG tablet Take 1 tablet (125 mcg total) by mouth daily. Patient taking differently: Take 125 mcg by mouth at bedtime. 07/10/20   Janora Norlander, DO  linaclotide (LINZESS) 72 MCG capsule Take 1 capsule (72 mcg total) by mouth daily before breakfast. Patient taking differently: Take 72 mcg by mouth daily as needed (constipation). 03/22/20   Levin Erp, PA  lisdexamfetamine (VYVANSE) 70 MG capsule Take 1 capsule (70 mg total) by mouth daily. 08/27/20   Janora Norlander, DO  lisdexamfetamine (VYVANSE) 70 MG capsule Take 1 capsule (70 mg total) by mouth daily. 10/26/20   Janora Norlander, DO  lisdexamfetamine (VYVANSE) 70 MG capsule Take 1 capsule (70 mg total) by mouth daily. 09/25/20   Janora Norlander, DO  medroxyPROGESTERone (DEPO-PROVERA) 150 MG/ML injection Inject 1 mL (150 mg total) into the  muscle every 3 (three) months. 07/10/20   Ronnie Doss M, DO  montelukast (SINGULAIR) 10 MG tablet TAKE ONE TAB BY MOUTH AT BEDTIME Patient taking differently: Take 10 mg by mouth at bedtime. 07/10/20   Janora Norlander, DO  nystatin cream (MYCOSTATIN) Apply 1 application topically 2 (two) times daily. x2 weeks Patient taking differently: Apply 1 application topically 2 (two) times daily as needed (rash). 01/24/20   Janora Norlander, DO  ondansetron (ZOFRAN) 4 MG tablet Take 1 tablet (4 mg total) by mouth every 8 (eight) hours as needed for nausea or vomiting. 04/25/20   Janora Norlander, DO  pioglitazone (ACTOS) 30 MG tablet Take 1 tablet (30 mg total) by mouth daily. Patient taking differently: Take 30 mg by mouth at bedtime. 05/23/20   Janora Norlander, DO  promethazine (PHENERGAN) 25 MG tablet Take 1 tablet (25 mg total) by mouth every 6 (six) hours as needed for nausea or vomiting. 06/11/20   Rosemarie Ax, MD  rosuvastatin (CRESTOR) 40 MG tablet Take 1 tablet (40 mg total) by mouth daily. 09/11/20   Janora Norlander, DO  Semaglutide (RYBELSUS) 14 MG TABS Take 1 tablet by mouth daily before breakfast. Patient taking differently: Take 14 mg by mouth daily before breakfast. 07/10/20   Ronnie Doss M, DO  sertraline (ZOLOFT) 100 MG tablet Take 2 tablets (200 mg total) by mouth daily. Patient taking differently: Take 200 mg by mouth at bedtime. 07/10/20   Janora Norlander, DO    Family History Family History  Problem Relation Age of Onset  . Mental illness Other   . Diabetes Other   . Healthy Mother   . Cancer Father   . Non-Hodgkin's lymphoma Father   . Breast cancer Paternal Grandmother   . Lung cancer Paternal Grandmother   . Esophageal cancer Other   . Colon cancer Neg Hx   . Stomach cancer Neg Hx   . Pancreatic cancer Neg Hx   . Colon polyps Neg Hx     Social History Social History   Tobacco Use  . Smoking status: Former  Smoker    Packs/day: 1.00     Years: 5.00    Pack years: 5.00    Quit date: 04/04/1993    Years since quitting: 27.5  . Smokeless tobacco: Never Used  . Tobacco comment: smoked when went out with friends/social smoker. pack would last a week.  Vaping Use  . Vaping Use: Former  Substance Use Topics  . Alcohol use: No  . Drug use: No     Allergies   Diphenhydramine, Morphine, Prednisone, Adhesive [tape], Erythromycin, Metformin and related, and Sulfonamide derivatives   Review of Systems Review of Systems  + sore throat + cough + hoarse No pleuritic pain + wheezing + nasal congestion No post-nasal drainage No sinus pain/pressure No itchy/red eyes No earache No hemoptysis + SOB No fever/chills No nausea No vomiting No abdominal pain No diarrhea No urinary symptoms No skin rash + fatigue No myalgias No headache    Physical Exam Triage Vital Signs ED Triage Vitals  Enc Vitals Group     BP 10/01/20 1134 133/81     Pulse Rate 10/01/20 1134 (!) 113     Resp 10/01/20 1134 17     Temp 10/01/20 1134 98.9 F (37.2 C)     Temp Source 10/01/20 1134 Oral     SpO2 10/01/20 1134 97 %     Weight --      Height --      Head Circumference --      Peak Flow --      Pain Score 10/01/20 1137 0     Pain Loc --      Pain Edu? --      Excl. in [place]? --    No data found.  Updated Vital Signs BP 133/81 (BP Location: Left Arm)   Pulse (!) 113   Temp 98.9 F (37.2 C) (Oral)   Resp 17   SpO2 97%   Visual Acuity Right Eye Distance:   Left Eye Distance:   Bilateral Distance:    Right Eye Near:   Left Eye Near:    Bilateral Near:     Physical Exam Nursing notes and Vital Signs reviewed. Appearance:  Patient appears stated age, and in no acute distress Eyes:  Pupils are equal, round, and reactive to light and accomodation.  Extraocular movement is intact.  Conjunctivae are not inflamed  Ears:  Canals normal.  Tympanic membranes normal.  Nose:  Mildly congested turbinates.  No sinus tenderness.     Pharynx:  Normal Neck:  Supple.  Mildly enlarged lateral nodes are present, tender to palpation on the left.   Lungs:  Clear to auscultation.  Breath sounds are equal.  Moving air well. Heart:  Regular rate and rhythm without murmurs, rubs, or gallops.  Abdomen:  Nontender without masses or hepatosplenomegaly.  Bowel sounds are present.  No CVA or flank tenderness.  Extremities:  No edema.  Skin:  No rash present.   UC Treatments / Results  Labs (all labs ordered are listed, but only abnormal results are displayed) Labs Reviewed - No data to display  EKG   Radiology No results found.  Procedures Procedures (including critical care time)  Medications Ordered in UC Medications  methylPREDNISolone acetate (DEPO-MEDROL) injection 80 mg (has no administration in time range)    Initial Impression / Assessment and Plan / UC Course  I have reviewed the triage vital signs and the nursing notes.  Pertinent labs & imaging results that were available during my care of the  patient were reviewed by me and considered in my medical decision making (see chart for details).    Administered Depo Medrol 35m IM.  Begin doxycycline. [facility]patient has no adverse effect from codeine). Followup with Family Doctor if not improved in one week.    Final Clinical Impressions(s) / UC Diagnoses   Final diagnoses:  Mild persistent asthmatic bronchitis without complication     Discharge Instructions     Take plain guaifenesin (12080mextended release tabs such as Mucinex) twice daily, with plenty of water, for cough and congestion.  Get adequate rest.   May use Afrin nasal spray (or generic oxymetazoline) each morning for about 5 days and then discontinue.  Also recommend using saline nasal spray several times daily and saline nasal irrigation (AYR is a common brand).  Use Flonase nasal spray each morning after using Afrin nasal spray and saline nasal  irrigation. Try warm salt water gargles for sore throat.  Stop all antihistamines for now, and other non-prescription cough/cold preparations. Continue all inhalers as prescribed.  If symptoms become significantly worse during the night or over the weekend, proceed to the local emergency room.       ED Prescriptions    Medication Sig Dispense Auth. Provider   doxycycline (VIBRAMYCIN) 100 MG capsule Take one cap PO Q12hr with food. 14 capsule BeKandra NicolasMD   guaiFENesin-codeine 100-10 MG/5ML syrup Take 104my mouth at bedtime as needed for cough. 50 mL BeeKandra NicolasD        BeeKandra NicolasD 10/04/20 [phone]

## 2020-10-01 NOTE — Discharge Instructions (Addendum)
Take plain guaifenesin (1200mg  extended release tabs such as Mucinex) twice daily, with plenty of water, for cough and congestion.  Get adequate rest.   May use Afrin nasal spray (or generic oxymetazoline) each morning for about 5 days and then discontinue.  Also recommend using saline nasal spray several times daily and saline nasal irrigation (AYR is a common brand).  Use Flonase nasal spray each morning after using Afrin nasal spray and saline nasal irrigation. Try warm salt water gargles for sore throat.  Stop all antihistamines for now, and other non-prescription cough/cold preparations. Continue all inhalers as prescribed.  If symptoms become significantly worse during the night or over the weekend, proceed to the local emergency room.

## 2020-10-04 ENCOUNTER — Other Ambulatory Visit: Payer: Self-pay

## 2020-10-04 ENCOUNTER — Encounter: Payer: Self-pay | Admitting: Nurse Practitioner

## 2020-10-04 ENCOUNTER — Ambulatory Visit (INDEPENDENT_AMBULATORY_CARE_PROVIDER_SITE_OTHER): Payer: Medicaid Other | Admitting: Nurse Practitioner

## 2020-10-04 ENCOUNTER — Ambulatory Visit (INDEPENDENT_AMBULATORY_CARE_PROVIDER_SITE_OTHER): Payer: Medicaid Other

## 2020-10-04 VITALS — BP 123/78 | HR 98 | Temp 97.2°F | Ht 68.0 in | Wt 296.0 lb

## 2020-10-04 DIAGNOSIS — Z09 Encounter for follow-up examination after completed treatment for conditions other than malignant neoplasm: Secondary | ICD-10-CM

## 2020-10-04 DIAGNOSIS — J4521 Mild intermittent asthma with (acute) exacerbation: Secondary | ICD-10-CM | POA: Insufficient documentation

## 2020-10-04 DIAGNOSIS — J45909 Unspecified asthma, uncomplicated: Secondary | ICD-10-CM | POA: Diagnosis not present

## 2020-10-04 DIAGNOSIS — J45901 Unspecified asthma with (acute) exacerbation: Secondary | ICD-10-CM | POA: Diagnosis not present

## 2020-10-04 MED ORDER — GUAIFENESIN-CODEINE 100-10 MG/5ML PO SOLN: 10.0000 mL | Freq: Three times a day (TID) | ORAL | 0 refills | Status: DC | PRN

## 2020-10-04 MED ORDER — METHYLPREDNISOLONE ACETATE 40 MG/ML IJ SUSP
40.0000 mg | Freq: Once | INTRAMUSCULAR | Status: AC
Start: 2020-10-04 — End: 2020-10-04
  Administered 2020-10-04: 40 mg via INTRAMUSCULAR

## 2020-10-04 NOTE — Progress Notes (Signed)
Surgical Instructions    Your procedure is scheduled on 10/09/20.  Report to [facility] Main Entrance "A" at 11:15 A.M., then check in with the Admitting office.  Call this number if you have problems the morning of surgery:  [phone]   If you have any questions prior to your surgery date call [phone]: Open Monday-Friday 8am-4pm    Remember:  Do not eat or drink after midnight the night before your surgery     Take these medicines the morning of surgery with A SIP OF WATER  albuterol (PROVENTIL) (2.5 MG/3ML)  Nebulizer and inhaler budesonide-formoterol (SYMBICORT) cyclobenzaprine (FLEXERIL)  dexlansoprazole (DEXILANT) dicyclomine (BENTYL) as needed for muscle spasms gabapentin (NEURONTIN) doxycycline (VIBRAMYCIN)  hydrOXYzine (ATARAX/VISTARIL) if needed lamoTRIgine (LAMICTAL) levothyroxine (SYNTHROID) ondansetron (ZOFRAN) if needed promethazine (PHENERGAN) if needed     As of today, STOP taking any Aspirin (unless otherwise instructed by your surgeon) Aleve, Naproxen, Ibuprofen, Motrin, Advil, Goody's, BC's, all herbal medications, fish oil, diclofenac Sodium (VOLTAREN) 1 % GEL and all vitamins.   HOW TO MANAGE YOUR DIABETES BEFORE AND AFTER SURGERY  Why is it important to control my blood sugar before and after surgery? . Improving blood sugar levels before and after surgery helps healing and can limit problems. . A way of improving blood sugar control is eating a healthy diet by: o  Eating less sugar and carbohydrates o  Increasing activity/exercise o  Talking with your doctor about reaching your blood sugar goals . High blood sugars (greater than 180 mg/dL) can raise your risk of infections and slow your recovery, so you will need to focus on controlling your diabetes during the weeks before surgery. . Make sure that the doctor who takes care of your diabetes knows about your planned surgery including the date and location.  How do I manage my blood sugar  before surgery? . Check your blood sugar at least 4 times a day, starting 2 days before surgery, to make sure that the level is not too high or low. o Check your blood sugar the morning of your surgery when you wake up and every 2 hours until you get to the Short Stay unit. . If your blood sugar is less than 70 mg/dL, you will need to treat for low blood sugar: o Do not take insulin. o Treat a low blood sugar (less than 70 mg/dL) with  cup of clear juice (cranberry or apple), 4 glucose tablets, OR glucose gel. Recheck blood sugar in 15 minutes after treatment (to make sure it is greater than 70 mg/dL). If your blood sugar is not greater than 70 mg/dL on recheck, call [phone] o  for further instructions. . Report your blood sugar to the short stay nurse when you get to Short Stay.  . If you are admitted to the hospital after surgery: o Your blood sugar will be checked by the staff and you will probably be given insulin after surgery (instead of oral diabetes medicines) to make sure you have good blood sugar levels. o The goal for blood sugar control after surgery is 80-180 mg/dL.     WHAT DO I DO ABOUT MY DIABETES MEDICATION?   Marland Kitchen Do not take oral diabetes medicines (pills):  the morning of surgery.  . THE NIGHT BEFORE SURGERY, take 1/2 dose (33 units) of insulin degludec (TRESIBA FLEXTOUCH).      . THE MORNING OF SURGERY, do not take Semaglutide (RYBELSUS).  . The day of surgery, do not take other diabetes injectables, including  Byetta (exenatide), Bydureon (exenatide ER), Victoza (liraglutide), or Trulicity (dulaglutide).                      Do not wear jewelry, make up, or nail polish            Do not wear lotions, powders, perfumes, or deodorant.            Do not shave 48 hours prior to surgery.              Do not bring valuables to the hospital.            [facility] is not responsible for any belongings or valuables.  Do NOT Smoke (Tobacco/Vaping) or drink Alcohol 24  hours prior to your procedure If you use a CPAP at night, you may bring all equipment for your overnight stay.   Contacts, glasses, dentures or bridgework may not be worn into surgery, please bring cases for these belongings   For patients admitted to the hospital, discharge time will be determined by your treatment team.   Patients discharged the day of surgery will not be allowed to drive home, and someone needs to stay with them for 24 hours.    Special instructions:    Oral Hygiene is also important to reduce your risk of infection.  Remember - BRUSH YOUR TEETH THE MORNING OF SURGERY WITH YOUR REGULAR TOOTHPASTE   [place]- Preparing For Surgery  Before surgery, you can play an important role. Because skin is not sterile, your skin needs to be as free of germs as possible. You can reduce the number of germs on your skin by washing with CHG (chlorahexidine gluconate) Soap before surgery.  CHG is an antiseptic cleaner which kills germs and bonds with the skin to continue killing germs even after washing.     Please do not use if you have an allergy to CHG or antibacterial soaps. If your skin becomes reddened/irritated stop using the CHG.  Do not shave (including legs and underarms) for at least 48 hours prior to first CHG shower. It is OK to shave your face.  Please follow these instructions carefully.    1.  Shower the NIGHT BEFORE SURGERY and the MORNING OF SURGERY with CHG Soap.   If you chose to wash your hair, wash your hair first as usual with your normal shampoo. After you shampoo, rinse your hair and body thoroughly to remove the shampoo.  Then [employer] and genitals (private parts) with your normal soap and rinse thoroughly to remove soap.  2. After that Use CHG Soap as you would any other liquid soap. You can apply CHG directly to the skin and wash gently with a scrungie or a clean washcloth.   3. Apply the CHG Soap to your body ONLY FROM THE NECK DOWN.  Do not use on  open wounds or open sores. Avoid contact with your eyes, ears, mouth and genitals (private parts). Wash Face and genitals (private parts)  with your normal soap.   4. Wash thoroughly, paying special attention to the area where your surgery will be performed.  5. Thoroughly rinse your body with warm water from the neck down.  6. DO NOT shower/wash with your normal soap after using and rinsing off the CHG Soap.  7. Pat yourself dry with a CLEAN TOWEL.  8. Wear CLEAN PAJAMAS to bed the night before surgery  9. Place CLEAN SHEETS on your bed the night before  your surgery  10. DO NOT SLEEP WITH PETS.   Day of Surgery: Take a shower with CHG soap. Wear Clean/Comfortable clothing the morning of surgery Do not apply any deodorants/lotions.   Remember to brush your teeth WITH YOUR REGULAR TOOTHPASTE.   Please read over the following fact sheets that you were given.

## 2020-10-04 NOTE — Assessment & Plan Note (Signed)
Hospital discharge instructions with medication reconciliation completed.  Patient verbalized understanding.  Printed handouts given.   Patient knows to follow-up with worsening unresolved symptoms.

## 2020-10-04 NOTE — Assessment & Plan Note (Addendum)
Symptoms are better, patient is still coughing.  Patient is unable to take prednisone by mouth.  Will give prednisone shot in clinic for resolved cough and mild wheezing.  Reemphasized hospital discharge instructions on taking medication as prescribed.  Education provided with printed handouts given.  Advised to follow-up with worsening or unresolved symptoms.

## 2020-10-04 NOTE — Progress Notes (Signed)
Established Patient Office Visit  Subjective:  Patient ID: Judith [name], female    DOB: [birth year]  Age: 50 y.o. MRN: [id number]  CC:  Chief Complaint  Patient presents with  . Follow-up  . Asthma  . Hospitalization Follow-up    HPI Judith [name] patient is a 50 year old female who presented to the emergency department with mild persistent asthmatic bronchitis without complications.  Patient was hoarse with increased cough.  Patient was stabilized and sent home on guaifenesin 1200 mg extended release tablet twice daily for cough and congestion.  Patient was advised to discontinue all antihistamine and nonprescription cough and cold medications.  She was encouraged to continue all inhalers as prescribed and to return to emergency department if symptoms were not resolved or get worse.   Completed hospital discharge follow-up with medication reconciliation.  Patient verbalized understanding.  Past Medical History:  Diagnosis Date  . Allergy   . Anxiety   . Arthritis   . Asthma   . Bipolar 1 disorder ([place])   . Blood transfusion without reported diagnosis    with child birth 48 yrs ago   . Bronchitis, chronic ([place])   . Constipation   . COVID-19 virus infection 06/21/2020  . DDD (degenerative disc disease), cervical   . Depression   . Diabetes mellitus without complication ([place])   . Dysphagia   . Gastroparesis   . GERD (gastroesophageal reflux disease)   . History of MRSA infection   . Hyperlipemia   . Hypothyroid   . Migraines   . Sleep apnea    no cpap     Past Surgical History:  Procedure Laterality Date  . ABDOMINAL SURGERY    . CARPAL TUNNEL RELEASE Bilateral 1999  . CESAREAN SECTION    . CHOLECYSTECTOMY    . COLONOSCOPY    . ESOPHAGEAL MANOMETRY N/A 07/08/2020   Procedure: ESOPHAGEAL MANOMETRY (EM);  Surgeon: Yetta Flock, MD;  Location: [facility];  Service: Gastroenterology;  Laterality: N/A;  . REPLACEMENT TOTAL KNEE Bilateral   . THYROIDECTOMY     . TONSILLECTOMY AND ADENOIDECTOMY    . UPPER GASTROINTESTINAL ENDOSCOPY      Family History  Problem Relation Age of Onset  . Mental illness Other   . Diabetes Other   . Healthy Mother   . Cancer Father   . Non-Hodgkin's lymphoma Father   . Breast cancer Paternal Grandmother   . Lung cancer Paternal Grandmother   . Esophageal cancer Other   . Colon cancer Neg Hx   . Stomach cancer Neg Hx   . Pancreatic cancer Neg Hx   . Colon polyps Neg Hx     Social History   Socioeconomic History  . Marital status: Divorced    Spouse name: Not on file  . Number of children: Not on file  . Years of education: Not on file  . Highest education level: Not on file  Occupational History  . Not on file  Tobacco Use  . Smoking status: Former Smoker    Packs/day: 1.00    Years: 5.00    Pack years: 5.00    Quit date: 04/04/1993    Years since quitting: 27.5  . Smokeless tobacco: Never Used  . Tobacco comment: smoked when went out with friends/social smoker. pack would last a week.  Vaping Use  . Vaping Use: Former  Substance and Sexual Activity  . Alcohol use: No  . Drug use: No  . Sexual activity: Yes    Birth  control/protection: I.U.D.  Other Topics Concern  . Not on file  Social History Narrative  . Not on file   Social Determinants of Health   Financial Resource Strain: Not on file  Food Insecurity: Not on file  Transportation Needs: Not on file  Physical Activity: Not on file  Stress: Not on file  Social Connections: Not on file  Intimate Partner Violence: Not on file    Outpatient Medications Prior to Visit  Medication Sig Dispense Refill  . Accu-Chek FastClix Lancets MISC Test blood sugars daily Dx E11.9 102 each 3  . albuterol (PROVENTIL) (2.5 MG/3ML) 0.083% nebulizer solution Take 3 mLs (2.5 mg total) by nebulization every 6 (six) hours as needed for wheezing or shortness of breath. Fill upon patient request 75 mL 12  . albuterol (VENTOLIN HFA) 108 (90 Base)  MCG/ACT inhaler Inhale 2 puffs into the lungs every 6 (six) hours as needed for wheezing or shortness of breath. Fill upon patient request 8 g 1  . Blood Glucose Monitoring Suppl (ACCU-CHEK GUIDE ME) w/Device KIT Use to check BS BID and as needed. DX: E11.9 1 kit 0  . budesonide-formoterol (SYMBICORT) 160-4.5 MCG/ACT inhaler Inhale 2 puffs into the lungs 2 (two) times daily. 1 each 3  . Continuous Blood Gluc Receiver (FREESTYLE LIBRE READER) DEVI 1 Units by Does not apply route daily. UAD to test BGs daily. Dx E11.40 1 each 1  . Continuous Blood Gluc Sensor (FREESTYLE LIBRE 14 DAY SENSOR) MISC 1 Units by Does not apply route daily. UAD to check blood sugars R73.09 2 each 12  . cyclobenzaprine (FLEXERIL) 10 MG tablet Take 1 tablet (10 mg total) by mouth 3 (three) times daily as needed for muscle spasms. (Patient taking differently: Take 10 mg by mouth 3 (three) times daily.) 60 tablet 5  . dexlansoprazole (DEXILANT) 60 MG capsule TAKE 1 CAPSULE BY MOUTH EVERY DAY (Patient taking differently: Take 60 mg by mouth daily.) 30 capsule 3  . diclofenac Sodium (VOLTAREN) 1 % GEL Apply 4 g topically 4 (four) times daily. For knee pain (Patient taking differently: Apply 4 g topically 4 (four) times daily as needed (knee pain).) 400 g 2  . dicyclomine (BENTYL) 20 MG tablet TAKE 1 TABLET (20 MG TOTAL) BY MOUTH EVERY 6 (SIX) HOURS. (Patient taking differently: Take 20 mg by mouth every 6 (six) hours as needed for spasms (abdominal cramps).) 360 tablet 0  . doxycycline (VIBRAMYCIN) 100 MG capsule Take one cap PO Q12hr with food. 14 capsule 0  . gabapentin (NEURONTIN) 300 MG capsule Take 1 capsule (300 mg total) by mouth at bedtime for 3 days, THEN 1 capsule (300 mg total) 2 (two) times daily for 3 days, THEN 1 capsule (300 mg total) 3 (three) times daily. 90 capsule 3  . glucose blood (ACCU-CHEK GUIDE) test strip Use as instructed E11.9 100 strip 12  . hydrOXYzine (ATARAX/VISTARIL) 25 MG tablet TAKE 1 TABLET BY MOUTH  THREE TIMES A DAY (Patient taking differently: Take 25 mg by mouth every 8 (eight) hours as needed for itching or anxiety.) 270 tablet 1  . insulin degludec (TRESIBA FLEXTOUCH) 200 UNIT/ML FlexTouch Pen Inject 66 Units into the skin at bedtime. (Patient taking differently: Inject 40-60 Units into the skin See admin instructions. At bedtime, 40-60 units per sliding scale.) 15 mL 3  . Insulin Pen Needle 31G X 5 MM MISC 1 Units by Does not apply route daily. 100 each 5  . lamoTRIgine (LAMICTAL) 150 MG tablet Take 1 tablet (  150 mg total) by mouth 2 (two) times daily. 60 tablet 2  . levothyroxine (SYNTHROID) 125 MCG tablet Take 1 tablet (125 mcg total) by mouth daily. (Patient taking differently: Take 125 mcg by mouth at bedtime.) 90 tablet 1  . linaclotide (LINZESS) 72 MCG capsule Take 1 capsule (72 mcg total) by mouth daily before breakfast. (Patient taking differently: Take 72 mcg by mouth daily as needed (constipation).) 30 capsule 2  . lisdexamfetamine (VYVANSE) 70 MG capsule Take 1 capsule (70 mg total) by mouth daily. 30 capsule 0  . [START ON 10/26/2020] lisdexamfetamine (VYVANSE) 70 MG capsule Take 1 capsule (70 mg total) by mouth daily. 30 capsule 0  . lisdexamfetamine (VYVANSE) 70 MG capsule Take 1 capsule (70 mg total) by mouth daily. 30 capsule 0  . medroxyPROGESTERone (DEPO-PROVERA) 150 MG/ML injection Inject 1 mL (150 mg total) into the muscle every 3 (three) months. 1 mL 3  . montelukast (SINGULAIR) 10 MG tablet TAKE ONE TAB BY MOUTH AT BEDTIME (Patient taking differently: Take 10 mg by mouth at bedtime.) 90 tablet 3  . nystatin cream (MYCOSTATIN) Apply 1 application topically 2 (two) times daily. x2 weeks (Patient taking differently: Apply 1 application topically 2 (two) times daily as needed (rash).) 60 g 0  . ondansetron (ZOFRAN) 4 MG tablet Take 1 tablet (4 mg total) by mouth every 8 (eight) hours as needed for nausea or vomiting. 20 tablet 0  . pioglitazone (ACTOS) 30 MG tablet Take 1  tablet (30 mg total) by mouth daily. (Patient taking differently: Take 30 mg by mouth at bedtime.) 90 tablet 3  . promethazine (PHENERGAN) 25 MG tablet Take 1 tablet (25 mg total) by mouth every 6 (six) hours as needed for nausea or vomiting. 30 tablet 0  . rosuvastatin (CRESTOR) 40 MG tablet Take 1 tablet (40 mg total) by mouth daily. 90 tablet 3  . Semaglutide (RYBELSUS) 14 MG TABS Take 1 tablet by mouth daily before breakfast. (Patient taking differently: Take 14 mg by mouth daily before breakfast.) 30 tablet prn  . sertraline (ZOLOFT) 100 MG tablet Take 2 tablets (200 mg total) by mouth daily. (Patient taking differently: Take 200 mg by mouth at bedtime.) 180 tablet 3  . guaiFENesin-codeine 100-10 MG/5ML syrup Take 79m by mouth at bedtime as needed for cough. 50 mL 0   Facility-Administered Medications Prior to Visit  Medication Dose Route Frequency Provider Last Rate Last Admin  . medroxyPROGESTERone (DEPO-PROVERA) injection 150 mg  150 mg Intramuscular Q90 days GRonnie DossM, DO   150 mg at 09/10/20 [id number]   Allergies  Allergen Reactions  . Diphenhydramine Other (See Comments)    Hair feels like it is crawling  . Morphine Nausea And Vomiting    PCA PUMP/ DRIP -- N/V IV PUSH IN ER NO PROBLEM PER PT.  . Prednisone Nausea And Vomiting  . Adhesive [Tape]     Paper tape causes irritation   . Erythromycin Hives  . Metformin And Related     Diarrhea with IR and XR products  . Sulfonamide Derivatives Other (See Comments)    Does not take due to family history    ROS Review of Systems  Constitutional: Negative.   HENT: Negative.   Eyes: Negative.   Respiratory: Positive for cough.   Cardiovascular: Negative.   Gastrointestinal: Negative.   Genitourinary: Negative.   Skin: Negative for rash.  All other systems reviewed and are negative.     Objective:    Physical Exam Vitals and  nursing note reviewed.  Constitutional:      Appearance: Normal appearance.  HENT:      Head: Normocephalic.     Nose: Nose normal.     Mouth/Throat:     Mouth: Mucous membranes are moist.     Pharynx: Oropharynx is clear.  Eyes:     Conjunctiva/sclera: Conjunctivae normal.  Cardiovascular:     Rate and Rhythm: Normal rate and regular rhythm.     Pulses: Normal pulses.     Heart sounds: Normal heart sounds.  Pulmonary:     Breath sounds: Normal breath sounds. No wheezing.  Abdominal:     General: Bowel sounds are normal.  Skin:    General: Skin is dry.     Findings: No rash.  Neurological:     Mental Status: She is alert and oriented to person, place, and time.     BP 123/78   Pulse 98   Temp (!) 97.2 F (36.2 C) (Temporal)   Ht _0  (1.727 m)   Wt 296 lb (134.3 kg)   SpO2 98%   BMI 45.01 kg/m  Wt Readings from Last 3 Encounters:  10/04/20 296 lb (134.3 kg)  09/26/20 (!) 303 lb 9.6 oz (137.7 kg)  08/27/20 (!) 300 lb 3.2 oz (136.2 kg)     Health Maintenance Due  Topic Date Due  . OPHTHALMOLOGY EXAM  Never done  . MAMMOGRAM  05/14/2019    There are no preventive care reminders to display for this patient.  Lab Results  Component Value Date   TSH 0.918 07/04/2020   Lab Results  Component Value Date   WBC 7.7 04/04/2020   HGB 13.7 04/04/2020   HCT 41.9 04/04/2020   MCV 80.9 04/04/2020   PLT 305.0 04/04/2020   Lab Results  Component Value Date   NA 141 09/10/2020   K 4.2 09/10/2020   CO2 25 09/10/2020   GLUCOSE 125 (H) 09/10/2020   BUN 13 09/10/2020   CREATININE 1.02 (H) 09/10/2020   BILITOT 0.3 09/10/2020   ALKPHOS 129 (H) 09/10/2020   AST 10 09/10/2020   ALT 12 09/10/2020   PROT 7.4 09/10/2020   ALBUMIN 4.1 09/10/2020   CALCIUM 9.6 09/10/2020   ANIONGAP 14 11/02/2018   EGFR 67 09/10/2020   Lab Results  Component Value Date   CHOL 204 (H) 09/10/2020   Lab Results  Component Value Date   HDL 29 (L) 09/10/2020   Lab Results  Component Value Date   LDLCALC 130 (H) 09/10/2020   Lab Results  Component Value Date   TRIG  250 (H) 09/10/2020   Lab Results  Component Value Date   CHOLHDL 7.0 (H) 09/10/2020   Lab Results  Component Value Date   HGBA1C 7.4 (H) 07/04/2020      Assessment & Plan:   Problem List Items Addressed This Visit      Respiratory   Mild intermittent asthma with acute exacerbation - Primary    Symptoms are better, patient is still coughing.  Patient is unable to take prednisone by mouth.  Will give prednisone shot in clinic for resolved cough and mild wheezing.  Reemphasized hospital discharge instructions on taking medication as prescribed.  Education provided with printed handouts given.  Advised to follow-up with worsening or unresolved symptoms.      Relevant Orders   DG Chest 2 View     Other   Hospital discharge follow-up    Hospital discharge instructions with medication reconciliation completed.  Patient verbalized  understanding.  Printed handouts given.   Patient knows to follow-up with worsening unresolved symptoms.         No orders of the defined types were placed in this encounter.   Follow-up: Return if symptoms worsen or fail to improve.    Ivy Lynn, NP

## 2020-10-04 NOTE — Patient Instructions (Signed)
Asthma Attack  Asthma attack, also called acute bronchospasm, is the sudden narrowing and tightening of the air passages, which limits the amount of oxygen that can get into the lungs. The narrowing is caused by inflammation and tightening of the muscles in the air tubes (bronchi) in the lungs. Too much mucus is also produced, which narrows the airways more. This can cause trouble breathing, loud breathing (wheezing), and coughing. The goal of treatment is to open the airways in the lungs and reduce inflammation. What are the causes? Possible causes or triggers of this condition include:  Animal dander, dust mites, or cockroaches.  Mold, pollen from trees or grass, or cold air.  Air pollutants such as dust, household cleaners, aerosol sprays, strong chemicals, strong odors, and smoke of any kind.  Stress or strong emotions such as crying or laughing hard.  Exercise or activity that requires a lot of energy.  Substances in foods and drinks, such as dried fruits and wine, called sulfites.  Certain medicines or medical conditions such as: ? Aspirin or beta-blockers. ? Infections or inflammatory conditions, such as a flu (influenza), a cold, pneumonia, or inflammation of the nasal membranes (rhinitis). ? Gastroesophageal reflux disease (GERD). GERD is a condition in which stomach acid backs up into your esophagus and spills into your trachea (windpipe), which can irritate your airways. What are the signs or symptoms? Symptoms of this condition include:  Wheezing. This may sound like whistling while breathing. This may only happen at night.  Excessive coughing. This may only happen at night.  Chest tightness or pain.  Shortness of breath.  Feeling like you cannot get enough air no matter how hard you breathe (air hunger). How is this diagnosed? This condition may be diagnosed based on:  Your medical history.  Your symptoms.  A physical exam.  Tests to check for other causes of  your symptoms or other conditions that may have triggered your asthma attack. These tests may include: ? A chest X-ray. ? Blood tests. ? Tests to assess lung function, such as breathing into a device that measures how much air you can inhale and exhale (spirometry). How is this treated? Treatment for this condition depends on the severity and cause of your asthma attack.  For mild attacks, you may receive medicines through a hand-held inhaler (metered dose inhaler, or MDI) or through a device that turns liquid medicine into a mist (nebulizer). These medicines include: ? Quick relief or rescue medicines that quickly relax the airways and lungs. ? Long-acting medicines that are used daily to prevent (control) your asthma symptoms.  For moderate or severe attacks, you may be treated with steroid medicines by mouth or through an IV injection at the hospital.  For severe attacks, you may need oxygen therapy or a breathing machine (ventilator).  If your asthma attack was caused by an infection from bacteria, you will be given antibiotic medicines. Follow these instructions at home: Medicines  Take over-the-counter and prescription medicines only as told by your health care provider. ? Keep your medicines up-to-date. ? Make sure you have all of your medicines available at all times.  If you were prescribed an antibiotic medicine, take it as told by your health care provider. Do not stop taking the antibiotic even if you start to feel better.  Tell your doctor if you may be pregnant to make sure your asthma medicine is safe to use during pregnancy. Avoiding triggers  Keep track of things that trigger your asthma attacks. Avoid   exposure to these triggers.  Do not use any products that contain nicotine or tobacco, such as cigarettes, e-cigarettes, and chewing tobacco. If you need help quitting, ask your health care provider.  When there is a lot of pollen, air pollution, or humidity, keep  windows closed and use an air conditioner or go to places with air conditioning.   Asthma action plan  Work with your health care provider to make a written plan for managing and treating your asthma attacks (asthma action plan). This plan should include: ? A list of your asthma triggers and how to avoid them. ? A list of symptoms that you may have during an asthma attack. ? Information about which medicine to take, when to take the medicine and how much of the medicine to take. ? Information to help you understand your peak flow measurements. ? Daily actions that you can take to control your asthma symptoms. ? Contact information for your health care providers.  If you have an asthma attack, act quickly. Follow the emergency steps on your written asthma action plan. This may prevent you from needing to go to the hospital. General instructions  Avoid excessive exercise or activity until your asthma attack goes away. Ask your health care provider what activities are safe for you and when you can return to your normal activities.  Stay up to date on all your vaccines, such as flu and pneumonia vaccines.  Drink enough fluid to keep your urine pale yellow. Staying hydrated helps keep mucus in your lungs thin so it can be coughed up easily.  Do not use alcohol until you have recovered.  Keep all follow-up visits as told by your health care provider. This is important. Asthma requires careful medical care. Contact a health care provider if:  You have followed your action plan for 1 hour and your peak flow reading is still at 50-79%. This is in the yellow zone, which means "caution."  You need to use your quick reliever medicine more frequently than normal.  Your medicines are causing side effects, such as rash, itching, swelling, or trouble breathing.  Your symptoms do not improve after 48 hours.  You cough up mucus that is thicker than usual.  You have a fever. Get help right away  if:  Your peak flow reading is less than 50% of your personal best. This is in the red zone, which means "danger."  You have trouble breathing.  You develop chest pain or discomfort.  Your medicines no longer seem to be helping.  You are coughing up bloody mucus.  You have a fever and your symptoms suddenly get worse.  You have trouble swallowing.  You feel very tired, and breathing becomes tiring. These symptoms may represent a serious problem that is an emergency. Do not wait to see if the symptoms will go away. Get medical help right away. Call your local emergency services (911 in the [place] Do not drive yourself to the hospital. Summary  Asthma attacks are caused by narrowing or tightness in air passages, which causes shortness of breath, coughing, and loud breathing (wheezing).  Many things can trigger an asthma attack, such as allergens, weather changes, exercise, strong odors, and smoke of any kind.  If you have an asthma attack, act quickly. Follow the emergency steps on your written asthma action plan.  Get help right away if you have severe trouble breathing, chest pain, or fever, or if your home medicines are no longer helping with your symptoms.   This information is not intended to replace advice given to you by your health care provider. Make sure you discuss any questions you have with your health care provider. Document Revised: 05/02/2019 Document Reviewed: 05/02/2019 Elsevier Patient Education  2021 [employer]  

## 2020-10-07 ENCOUNTER — Other Ambulatory Visit: Payer: Self-pay

## 2020-10-07 ENCOUNTER — Encounter (HOSPITAL_COMMUNITY): Payer: Self-pay

## 2020-10-07 ENCOUNTER — Encounter (HOSPITAL_COMMUNITY)
Admission: RE | Admit: 2020-10-07 | Discharge: 2020-10-07 | Disposition: A | Discharge status: Home / Self Care | Payer: Medicaid Other | Source: Ambulatory Visit | Attending: Neurosurgery | Admitting: Neurosurgery

## 2020-10-07 DIAGNOSIS — Z01818 Encounter for other preprocedural examination: Secondary | ICD-10-CM | POA: Insufficient documentation

## 2020-10-07 DIAGNOSIS — Z20822 Contact with and (suspected) exposure to covid-19: Secondary | ICD-10-CM | POA: Insufficient documentation

## 2020-10-07 LAB — SARS CORONAVIRUS 2 (TAT 6-24 HRS): SARS Coronavirus 2: NEGATIVE

## 2020-10-07 LAB — CBC
HCT: 43.2 % (ref 36.0–46.0)
Hemoglobin: 14.2 g/dL (ref 12.0–15.0)
MCH: 27.3 pg (ref 26.0–34.0)
MCHC: 32.9 g/dL (ref 30.0–36.0)
MCV: 83.1 fL (ref 80.0–100.0)
Platelets: 360 10*3/uL (ref 150–400)
RBC: 5.2 MIL/uL — ABNORMAL HIGH (ref 3.87–5.11)
RDW: 13.8 % (ref 11.5–15.5)
WBC: 8.1 10*3/uL (ref 4.0–10.5)
nRBC: 0 % (ref 0.0–0.2)

## 2020-10-07 LAB — HEMOGLOBIN A1C
Hgb A1c MFr Bld: 8 % — ABNORMAL HIGH (ref 4.8–5.6)
Mean Plasma Glucose: 182.9 mg/dL

## 2020-10-07 LAB — BASIC METABOLIC PANEL
Anion gap: 10 (ref 5–15)
BUN: 11 mg/dL (ref 6–20)
CO2: 25 mmol/L (ref 22–32)
Calcium: 8.7 mg/dL — ABNORMAL LOW (ref 8.9–10.3)
Chloride: 102 mmol/L (ref 98–111)
Creatinine, Ser: 0.82 mg/dL (ref 0.44–1.00)
GFR, Estimated: >60 mL/min (ref >60)
Glucose, Bld: 116 mg/dL — ABNORMAL HIGH (ref 70–99)
Potassium: 3.8 mmol/L (ref 3.5–5.1)
Sodium: 137 mmol/L (ref 135–145)

## 2020-10-07 LAB — GLUCOSE, CAPILLARY: Glucose-Capillary: 145 mg/dL — ABNORMAL HIGH (ref 70–99)

## 2020-10-07 LAB — SURGICAL PCR SCREEN
MRSA, PCR: NEGATIVE
Staphylococcus aureus: NEGATIVE

## 2020-10-07 NOTE — Progress Notes (Addendum)
PCP - Adam Phenix Cardiologist - denies  Chest x-ray - n/a EKG - 10/07/20 Stress Test - denies ECHO - denies Cardiac Cath - denies  Sleep Study - OSA+ CPAP - denies use d/t financial reasons.  Fasting Blood Sugar - 115-125 CBG at PAT 145 Pt with a continuous glucose monitor and checks Blood Sugar multiple times a day. Will collect A1C today. Last A1C in 06/2020 was 7.4  Blood Thinner Instructions:n/a Aspirin Instructions:n/a   COVID TEST- 10/07/20; pending.   Anesthesia review: Yes, A1C 8  Patient denies shortness of breath, fever, cough and chest pain at PAT appointment   All instructions explained to the patient, with a verbal understanding of the material. Patient agrees to go over the instructions while at home for a better understanding. Patient also instructed to self quarantine after being tested for COVID-19. The opportunity to ask questions was provided.

## 2020-10-07 NOTE — Telephone Encounter (Signed)
PA request to [facility] for NUCALA has been DENIED. No information regarding determination is available at the moment, will await fax and/or call to request additional information.

## 2020-10-07 NOTE — Pre-Procedure Instructions (Signed)
Surgical Instructions    Your procedure is scheduled on Wednesday, May 25th.  Report to [facility] Main Entrance "A" at 11:15 A.M., then check in with the Admitting office.  Call this number if you have problems the morning of surgery:  [phone]   If you have any questions prior to your surgery date call [phone]: Open Monday-Friday 8am-4pm    Remember:  Do not eat or drink after midnight the night before your surgery     Take these medicines the morning of surgery with A SIP OF WATER  budesonide-formoterol (SYMBICORT) gabapentin (NEURONTIN)  lamoTRIgine (LAMICTAL) levothyroxine (SYNTHROID)    Take these AS NEEDED Albuterol Inhaler or Nebulizer-please bring inhaler with you to the hospital. cyclobenzaprine (FLEXERIL) dicyclomine (BENTYL)  hydrOXYzine (ATARAX/VISTARIL)  ondansetron (ZOFRAN) or promethazine (PHENERGAN)  As of today, STOP taking any Aspirin (unless otherwise instructed by your surgeon) Aleve, Naproxen, Ibuprofen, Motrin, Advil, Goody's, BC's, all herbal medications, fish oil, and all vitamins. This includes: diclofenac Sodium (VOLTAREN) GEL.   WHAT DO I DO ABOUT MY DIABETES MEDICATION?   Marland Kitchen Do not take Semaglutide (RYBELSUS) or pioglitazone (ACTOS) the day of surgery.  . THE NIGHT BEFORE SURGERY, take 33 units of insulin degludec (TRESIBA FLEXTOUCH) insulin.   HOW TO MANAGE YOUR DIABETES BEFORE AND AFTER SURGERY  Why is it important to control my blood sugar before and after surgery? . Improving blood sugar levels before and after surgery helps healing and can limit problems. . A way of improving blood sugar control is eating a healthy diet by: o  Eating less sugar and carbohydrates o  Increasing activity/exercise o  Talking with your doctor about reaching your blood sugar goals . High blood sugars (greater than 180 mg/dL) can raise your risk of infections and slow your recovery, so you will need to focus on controlling your diabetes during the  weeks before surgery. . Make sure that the doctor who takes care of your diabetes knows about your planned surgery including the date and location.  How do I manage my blood sugar before surgery? . Check your blood sugar at least 4 times a day, starting 2 days before surgery, to make sure that the level is not too high or low.  . Check your blood sugar the morning of your surgery when you wake up and every 2 hours until you get to the Short Stay unit.  o If your blood sugar is less than 70 mg/dL, you will need to treat for low blood sugar: - Do not take insulin. - Treat a low blood sugar (less than 70 mg/dL) with  cup of clear juice (cranberry or apple), 4 glucose tablets, OR glucose gel. - Recheck blood sugar in 15 minutes after treatment (to make sure it is greater than 70 mg/dL). If your blood sugar is not greater than 70 mg/dL on recheck, call [phone] for further instructions. . Report your blood sugar to the short stay nurse when you get to Short Stay.  . If you are admitted to the hospital after surgery: o Your blood sugar will be checked by the staff and you will probably be given insulin after surgery (instead of oral diabetes medicines) to make sure you have good blood sugar levels. o The goal for blood sugar control after surgery is 80-180 mg/dL.                      Do NOT Smoke (Tobacco/Vaping) or drink Alcohol 24 hours prior to your procedure.  If you use a CPAP at night, you may bring all equipment for your overnight stay.   Contacts, glasses, piercing's, hearing aid's, dentures or partials may not be worn into surgery, please bring cases for these belongings.    For patients admitted to the hospital, discharge time will be determined by your treatment team.   Patients discharged the day of surgery will not be allowed to drive home, and someone needs to stay with them for 24 hours.    Special instructions:   [facility]- Preparing For Surgery  Before surgery,  you can play an important role. Because skin is not sterile, your skin needs to be as free of germs as possible. You can reduce the number of germs on your skin by washing with CHG (chlorahexidine gluconate) Soap before surgery.  CHG is an antiseptic cleaner which kills germs and bonds with the skin to continue killing germs even after washing.    Oral Hygiene is also important to reduce your risk of infection.  Remember - BRUSH YOUR TEETH THE MORNING OF SURGERY WITH YOUR REGULAR TOOTHPASTE  Please do not use if you have an allergy to CHG or antibacterial soaps. If your skin becomes reddened/irritated stop using the CHG.  Do not shave (including legs and underarms) for at least 48 hours prior to first CHG shower. It is OK to shave your face.  Please follow these instructions carefully.   1. Shower the NIGHT BEFORE SURGERY and the MORNING OF SURGERY  2. If you chose to wash your hair, wash your hair first as usual with your normal shampoo.  3. After you shampoo, rinse your hair and body thoroughly to remove the shampoo.  4. Use CHG Soap as you would any other liquid soap. You can apply CHG directly to the skin and wash gently with a scrungie or a clean washcloth.   5. Apply the CHG Soap to your body ONLY FROM THE NECK DOWN.  Do not use on open wounds or open sores. Avoid contact with your eyes, ears, mouth and genitals (private parts). Wash Face and genitals (private parts)  with your normal soap.   6. Wash thoroughly, paying special attention to the area where your surgery will be performed.  7. Thoroughly rinse your body with warm water from the neck down.  8. DO NOT shower/wash with your normal soap after using and rinsing off the CHG Soap.  9. Pat yourself dry with a CLEAN TOWEL.  10. Wear CLEAN PAJAMAS to bed the night before surgery  11. Place CLEAN SHEETS on your bed the night before your surgery  12. DO NOT SLEEP WITH PETS.   Day of Surgery: Shower with CHG soap. Do not  wear jewelry, make up, or nail polish Do not wear lotions, powders, perfumes, or deodorant. Do not shave 48 hours prior to surgery.   Do not bring valuables to the hospital. [facility] is not responsible for any belongings or valuables. Wear Clean/Comfortable clothing the morning of surgery Remember to brush your teeth WITH YOUR REGULAR TOOTHPASTE.   Please read over the following fact sheets that you were given.

## 2020-10-08 NOTE — Telephone Encounter (Addendum)
Called and spoke with Consuela at [facility] regarding determination for denial; she informed me that due having to use the diagnosis of "other" on the web portal, the follow up questions were unable to be generated and therefore the necessary questions were unable to be answered. She advised that I print out the physical [place] request form and fill that out instead. I have obtained a copy of this form and will attempt to resubmit.  Call Reference #: [id number]

## 2020-10-08 NOTE — Progress Notes (Signed)
Anesthesia Chart Review:   Case: [id number] Date/Time: 10/09/20 1301   Procedure: ACDF - C5-C6 (N/A ) - 3C   Anesthesia type: General   Pre-op diagnosis: Stenosis   Location: [facility] OR ROOM 20 / [place] OR   Surgeons: Vallarie Mare, MD      DISCUSSION: Pt is 50 years old with hx HTN, DM, OSA, asthma  - HbA1c 8.0, glucose 116 at pre-admission testing   VS: BP (!) 137/94   Pulse (!) 102   Temp 37.4 C (Oral)   Resp 18   Ht $R'5\' 8"'Wv$  (1.727 m)   Wt 135.3 kg   SpO2 96%   BMI 45.34 kg/m    PROVIDERS: Gottschalk, Ashly M, DO   LABS:  - HbA1c 8.0, glucose 116 at pre-admission testing  (all labs ordered are listed, but only abnormal results are displayed)  Labs Reviewed  GLUCOSE, CAPILLARY - Abnormal; Notable for the following components:      Result Value   Glucose-Capillary 145 (*)    All other components within normal limits  HEMOGLOBIN A1C - Abnormal; Notable for the following components:   Hgb A1c MFr Bld 8.0 (*)    All other components within normal limits  CBC - Abnormal; Notable for the following components:   RBC 5.20 (*)    All other components within normal limits  BASIC METABOLIC PANEL - Abnormal; Notable for the following components:   Glucose, Bld 116 (*)    Calcium 8.7 (*)    All other components within normal limits  SARS CORONAVIRUS 2 (TAT 6-24 HRS)  SURGICAL PCR SCREEN     IMAGES: CXR 10/05/20: Mild bronchial thickening without focal airspace disease   EKG 10/07/20: NSR   CV: n/a  Past Medical History:  Diagnosis Date  . Allergy   . Anxiety   . Arthritis   . Asthma   . Bipolar 1 disorder ([place])   . Blood transfusion without reported diagnosis    with child birth 76 yrs ago   . Bronchitis, chronic ([place])   . Constipation   . COVID-19 virus infection 06/21/2020  . DDD (degenerative disc disease), cervical   . Depression   . Diabetes mellitus without complication ([place])   . Dysphagia   . Family history of adverse reaction to anesthesia     mother had a hard time waking up after knee surgery in 2021  . Gastroparesis   . GERD (gastroesophageal reflux disease)   . History of kidney stones   . History of MRSA infection   . Hyperlipemia   . Hypothyroid   . Migraines   . Pneumonia   . Sleep apnea    no cpap     Past Surgical History:  Procedure Laterality Date  . ABDOMINAL SURGERY    . CARPAL TUNNEL RELEASE Bilateral 1999  . CESAREAN SECTION    . CHOLECYSTECTOMY    . COLONOSCOPY    . ESOPHAGEAL MANOMETRY N/A 07/08/2020   Procedure: ESOPHAGEAL MANOMETRY (EM);  Surgeon: Yetta Flock, MD;  Location: [facility];  Service: Gastroenterology;  Laterality: N/A;  . REPLACEMENT TOTAL KNEE Bilateral   . THYROIDECTOMY    . TONSILLECTOMY    . TONSILLECTOMY AND ADENOIDECTOMY    . UPPER GASTROINTESTINAL ENDOSCOPY      MEDICATIONS: . Accu-Chek FastClix Lancets MISC  . albuterol (PROVENTIL) (2.5 MG/3ML) 0.083% nebulizer solution  . albuterol (VENTOLIN HFA) 108 (90 Base) MCG/ACT inhaler  . Blood Glucose Monitoring Suppl (ACCU-CHEK GUIDE ME) w/Device KIT  . budesonide-formoterol (  SYMBICORT) 160-4.5 MCG/ACT inhaler  . Continuous Blood Gluc Receiver (FREESTYLE LIBRE READER) DEVI  . Continuous Blood Gluc Sensor (FREESTYLE LIBRE 14 DAY SENSOR) MISC  . cyclobenzaprine (FLEXERIL) 10 MG tablet  . dexlansoprazole (DEXILANT) 60 MG capsule  . diclofenac Sodium (VOLTAREN) 1 % GEL  . dicyclomine (BENTYL) 20 MG tablet  . doxycycline (VIBRAMYCIN) 100 MG capsule  . gabapentin (NEURONTIN) 300 MG capsule  . glucose blood (ACCU-CHEK GUIDE) test strip  . guaiFENesin-codeine 100-10 MG/5ML syrup  . hydrOXYzine (ATARAX/VISTARIL) 25 MG tablet  . insulin degludec (TRESIBA FLEXTOUCH) 200 UNIT/ML FlexTouch Pen  . Insulin Pen Needle 31G X 5 MM MISC  . lamoTRIgine (LAMICTAL) 150 MG tablet  . levothyroxine (SYNTHROID) 125 MCG tablet  . linaclotide (LINZESS) 72 MCG capsule  . lisdexamfetamine (VYVANSE) 70 MG capsule  . [START ON 10/26/2020]  lisdexamfetamine (VYVANSE) 70 MG capsule  . lisdexamfetamine (VYVANSE) 70 MG capsule  . medroxyPROGESTERone (DEPO-PROVERA) 150 MG/ML injection  . montelukast (SINGULAIR) 10 MG tablet  . nystatin cream (MYCOSTATIN)  . ondansetron (ZOFRAN) 4 MG tablet  . pioglitazone (ACTOS) 30 MG tablet  . promethazine (PHENERGAN) 25 MG tablet  . rosuvastatin (CRESTOR) 40 MG tablet  . Semaglutide (RYBELSUS) 14 MG TABS  . sertraline (ZOLOFT) 100 MG tablet   . medroxyPROGESTERone (DEPO-PROVERA) injection 150 mg    If glucose acceptable day of surgery, I anticipate pt can proceed with surgery as scheduled.  Willeen Cass, PhD, FNP-BC [facility] Short Stay [facility]/Anesthesiology Phone: [phone] 10/08/2020 4:53 PM

## 2020-10-08 NOTE — Anesthesia Preprocedure Evaluation (Addendum)
Anesthesia Evaluation  Patient identified by MRN, date of birth, ID band Patient awake    Reviewed: Allergy & Precautions, NPO status , [name] Chart, lab work & pertinent test results  History of Anesthesia Complications Negative for: history of anesthetic complications  Airway Mallampati: III  TM Distance: >3 FB Neck ROM: Limited    Dental  (+) Partial Lower, Upper Dentures   Pulmonary asthma , sleep apnea (no cpap) , former smoker,    Pulmonary exam normal        Cardiovascular hypertension, Normal cardiovascular exam     Neuro/Psych  Headaches, PSYCHIATRIC DISORDERS Anxiety Depression Bipolar Disorder    GI/Hepatic Neg liver ROS, GERD  Medicated and Controlled,  Endo/Other  diabetes, Type 2Hypothyroidism Morbid obesity  Renal/GU negative Renal ROS     Musculoskeletal  (+) Arthritis ,   Abdominal   Peds  Hematology negative hematology ROS (+)   Anesthesia Other Findings Covid test negative   Reproductive/Obstetrics                            Anesthesia Physical Anesthesia Plan  ASA: III  Anesthesia Plan: General   Post-op Pain Management:    Induction: Intravenous  PONV Risk Score and Plan: 4 or greater and Treatment may vary due to age or medical condition, Ondansetron, Midazolam, Dexamethasone and Scopolamine patch - Pre-op  Airway Management Planned: Oral ETT and Video Laryngoscope Planned  Additional Equipment: None  Intra-op Plan:   Post-operative Plan: Extubation in OR  Informed Consent: I have reviewed the patients History and Physical, chart, labs and discussed the procedure including the risks, benefits and alternatives for the proposed anesthesia with the patient or authorized representative who has indicated his/her understanding and acceptance.     Dental advisory given  Plan Discussed with: CRNA and Anesthesiologist  Anesthesia Plan Comments:         Anesthesia Quick Evaluation

## 2020-10-08 NOTE — Telephone Encounter (Signed)
Faxed PA request to [place] for [place]. Will update after receiving a determination.  Fax# [phone] Phone# [phone]

## 2020-10-09 ENCOUNTER — Observation Stay (HOSPITAL_COMMUNITY)
Admission: RE | Admit: 2020-10-09 | Discharge: 2020-10-10 | Disposition: A | Discharge status: Home / Self Care | Payer: Medicaid Other | Attending: Neurosurgery | Admitting: Neurosurgery

## 2020-10-09 ENCOUNTER — Ambulatory Visit (HOSPITAL_COMMUNITY): Payer: Medicaid Other | Admitting: Emergency Medicine

## 2020-10-09 ENCOUNTER — Encounter (HOSPITAL_COMMUNITY): Payer: Self-pay | Admitting: Anesthesiology

## 2020-10-09 ENCOUNTER — Encounter (HOSPITAL_COMMUNITY)
Admission: RE | Disposition: A | Discharge status: Home / Self Care | Payer: Self-pay | Source: Home / Self Care | Attending: Neurosurgery

## 2020-10-09 ENCOUNTER — Ambulatory Visit (HOSPITAL_COMMUNITY): Payer: Medicaid Other

## 2020-10-09 ENCOUNTER — Ambulatory Visit (HOSPITAL_COMMUNITY): Payer: Medicaid Other | Admitting: Anesthesiology

## 2020-10-09 DIAGNOSIS — E785 Hyperlipidemia, unspecified: Secondary | ICD-10-CM | POA: Diagnosis not present

## 2020-10-09 DIAGNOSIS — Z87891 Personal history of nicotine dependence: Secondary | ICD-10-CM | POA: Insufficient documentation

## 2020-10-09 DIAGNOSIS — Z96653 Presence of artificial knee joint, bilateral: Secondary | ICD-10-CM | POA: Insufficient documentation

## 2020-10-09 DIAGNOSIS — Z7984 Long term (current) use of oral hypoglycemic drugs: Secondary | ICD-10-CM | POA: Insufficient documentation

## 2020-10-09 DIAGNOSIS — G959 Disease of spinal cord, unspecified: Secondary | ICD-10-CM | POA: Diagnosis present

## 2020-10-09 DIAGNOSIS — I10 Essential (primary) hypertension: Secondary | ICD-10-CM | POA: Insufficient documentation

## 2020-10-09 DIAGNOSIS — M4712 Other spondylosis with myelopathy, cervical region: Secondary | ICD-10-CM | POA: Diagnosis not present

## 2020-10-09 DIAGNOSIS — E119 Type 2 diabetes mellitus without complications: Secondary | ICD-10-CM | POA: Diagnosis not present

## 2020-10-09 DIAGNOSIS — J45901 Unspecified asthma with (acute) exacerbation: Secondary | ICD-10-CM | POA: Insufficient documentation

## 2020-10-09 DIAGNOSIS — Z981 Arthrodesis status: Secondary | ICD-10-CM | POA: Diagnosis not present

## 2020-10-09 DIAGNOSIS — Z794 Long term (current) use of insulin: Secondary | ICD-10-CM | POA: Diagnosis not present

## 2020-10-09 DIAGNOSIS — M4802 Spinal stenosis, cervical region: Secondary | ICD-10-CM | POA: Diagnosis not present

## 2020-10-09 DIAGNOSIS — M5412 Radiculopathy, cervical region: Secondary | ICD-10-CM | POA: Diagnosis not present

## 2020-10-09 DIAGNOSIS — M40292 Other kyphosis, cervical region: Secondary | ICD-10-CM | POA: Diagnosis not present

## 2020-10-09 DIAGNOSIS — Z419 Encounter for procedure for purposes other than remedying health state, unspecified: Secondary | ICD-10-CM

## 2020-10-09 DIAGNOSIS — M4322 Fusion of spine, cervical region: Secondary | ICD-10-CM | POA: Diagnosis not present

## 2020-10-09 DIAGNOSIS — E039 Hypothyroidism, unspecified: Secondary | ICD-10-CM | POA: Diagnosis not present

## 2020-10-09 HISTORY — PX: ANTERIOR CERVICAL DECOMP/DISCECTOMY FUSION: SHX1161

## 2020-10-09 HISTORY — DX: Family history of other specified conditions: Z84.89

## 2020-10-09 HISTORY — DX: Pneumonia, unspecified organism: J18.9

## 2020-10-09 HISTORY — DX: Personal history of urinary calculi: Z87.442

## 2020-10-09 LAB — POCT PREGNANCY, URINE: Preg Test, Ur: NEGATIVE

## 2020-10-09 LAB — GLUCOSE, CAPILLARY
Glucose-Capillary: 129 mg/dL — ABNORMAL HIGH (ref 70–99)
Glucose-Capillary: 171 mg/dL — ABNORMAL HIGH (ref 70–99)

## 2020-10-09 SURGERY — ANTERIOR CERVICAL DECOMPRESSION/DISCECTOMY FUSION 1 LEVEL
Anesthesia: General | Site: Neck

## 2020-10-09 MED ORDER — CYCLOBENZAPRINE HCL 10 MG PO TABS
10.0000 mg | ORAL_TABLET | Freq: Three times a day (TID) | ORAL | Status: DC | PRN
  Administered 2020-10-09 – 2020-10-10 (×2): 10 mg via ORAL
  Filled 2020-10-09 (×2): qty 1

## 2020-10-09 MED ORDER — LISDEXAMFETAMINE DIMESYLATE 70 MG PO CAPS
70.0000 mg | ORAL_CAPSULE | Freq: Every morning | ORAL | Status: DC
Start: 2020-10-10 — End: 2020-10-10
  Filled 2020-10-09: qty 1

## 2020-10-09 MED ORDER — SUGAMMADEX SODIUM 500 MG/5ML IV SOLN
INTRAVENOUS | Status: AC
  Filled 2020-10-09: qty 5

## 2020-10-09 MED ORDER — PIOGLITAZONE HCL 30 MG PO TABS
30.0000 mg | ORAL_TABLET | Freq: Every day | ORAL | Status: DC
  Administered 2020-10-09: 30 mg via ORAL
  Filled 2020-10-09 (×3): qty 1

## 2020-10-09 MED ORDER — MOMETASONE FURO-FORMOTEROL FUM 200-5 MCG/ACT IN AERO
2.0000 | INHALATION_SPRAY | Freq: Two times a day (BID) | RESPIRATORY_TRACT | Status: DC
  Filled 2020-10-09: qty 8.8

## 2020-10-09 MED ORDER — ORAL CARE MOUTH RINSE: 15.0000 mL | Freq: Once | OROMUCOSAL | Status: AC

## 2020-10-09 MED ORDER — FENTANYL CITRATE (PF) 250 MCG/5ML IJ SOLN
INTRAMUSCULAR | Status: AC
  Filled 2020-10-09: qty 5

## 2020-10-09 MED ORDER — ALBUTEROL SULFATE HFA 108 (90 BASE) MCG/ACT IN AERS
2.0000 | INHALATION_SPRAY | Freq: Four times a day (QID) | RESPIRATORY_TRACT | Status: DC | PRN
  Filled 2020-10-09: qty 6.7

## 2020-10-09 MED ORDER — GUAIFENESIN-CODEINE 100-10 MG/5ML PO SOLN: 10.0000 mL | Freq: Three times a day (TID) | ORAL | Status: DC | PRN

## 2020-10-09 MED ORDER — PROMETHAZINE HCL 25 MG/ML IJ SOLN: 6.2500 mg | INTRAMUSCULAR | Status: DC | PRN

## 2020-10-09 MED ORDER — CEFAZOLIN SODIUM-DEXTROSE 2-4 GM/100ML-% IV SOLN
2.0000 g | INTRAVENOUS | Status: AC
  Administered 2020-10-09: 2 g via INTRAVENOUS
  Filled 2020-10-09: qty 100

## 2020-10-09 MED ORDER — INSULIN ASPART 100 UNIT/ML IJ SOLN
0.0000 [IU] | Freq: Three times a day (TID) | INTRAMUSCULAR | Status: DC
  Administered 2020-10-10: 5 [IU] via SUBCUTANEOUS

## 2020-10-09 MED ORDER — SUGAMMADEX SODIUM 500 MG/5ML IV SOLN
INTRAVENOUS | Status: DC | PRN
  Administered 2020-10-09: 100 mg via INTRAVENOUS
  Administered 2020-10-09: 400 mg via INTRAVENOUS

## 2020-10-09 MED ORDER — THROMBIN 5000 UNITS EX SOLR
CUTANEOUS | Status: AC
  Filled 2020-10-09: qty 5000

## 2020-10-09 MED ORDER — LIDOCAINE 2% (20 MG/ML) 5 ML SYRINGE
INTRAMUSCULAR | Status: AC
  Filled 2020-10-09: qty 5

## 2020-10-09 MED ORDER — POLYETHYLENE GLYCOL 3350 17 G PO PACK: 17.0000 g | PACK | Freq: Every day | ORAL | Status: DC | PRN

## 2020-10-09 MED ORDER — ROCURONIUM BROMIDE 10 MG/ML (PF) SYRINGE
PREFILLED_SYRINGE | INTRAVENOUS | Status: DC | PRN
  Administered 2020-10-09: 100 mg via INTRAVENOUS

## 2020-10-09 MED ORDER — MIDAZOLAM HCL 2 MG/2ML IJ SOLN
INTRAMUSCULAR | Status: AC
  Filled 2020-10-09: qty 2

## 2020-10-09 MED ORDER — THROMBIN 5000 UNITS EX SOLR: OROMUCOSAL | Status: DC | PRN

## 2020-10-09 MED ORDER — LEVOTHYROXINE SODIUM 25 MCG PO TABS
125.0000 ug | ORAL_TABLET | Freq: Every day | ORAL | Status: DC
  Administered 2020-10-09: 125 ug via ORAL
  Filled 2020-10-09: qty 1

## 2020-10-09 MED ORDER — SODIUM CHLORIDE 0.9% FLUSH: 3.0000 mL | INTRAVENOUS | Status: DC | PRN

## 2020-10-09 MED ORDER — PROPOFOL 10 MG/ML IV BOLUS
INTRAVENOUS | Status: DC | PRN
  Administered 2020-10-09: 150 mg via INTRAVENOUS

## 2020-10-09 MED ORDER — PHENYLEPHRINE 40 MCG/ML (10ML) SYRINGE FOR IV PUSH (FOR BLOOD PRESSURE SUPPORT)
PREFILLED_SYRINGE | INTRAVENOUS | Status: AC
  Filled 2020-10-09: qty 10

## 2020-10-09 MED ORDER — SODIUM CHLORIDE 0.9% FLUSH: 3.0000 mL | Freq: Two times a day (BID) | INTRAVENOUS | Status: DC

## 2020-10-09 MED ORDER — OXYCODONE HCL 5 MG PO TABS: 5.0000 mg | ORAL_TABLET | Freq: Once | ORAL | Status: DC | PRN

## 2020-10-09 MED ORDER — SUCCINYLCHOLINE CHLORIDE 200 MG/10ML IV SOSY
PREFILLED_SYRINGE | INTRAVENOUS | Status: AC
  Filled 2020-10-09: qty 10

## 2020-10-09 MED ORDER — ROCURONIUM BROMIDE 10 MG/ML (PF) SYRINGE
PREFILLED_SYRINGE | INTRAVENOUS | Status: AC
  Filled 2020-10-09: qty 20

## 2020-10-09 MED ORDER — FENTANYL CITRATE (PF) 100 MCG/2ML IJ SOLN: INTRAMUSCULAR | Status: DC | PRN

## 2020-10-09 MED ORDER — ACETAMINOPHEN 650 MG RE SUPP: 650.0000 mg | RECTAL | Status: DC | PRN

## 2020-10-09 MED ORDER — ONDANSETRON HCL 4 MG/2ML IJ SOLN: 4.0000 mg | Freq: Four times a day (QID) | INTRAMUSCULAR | Status: DC | PRN

## 2020-10-09 MED ORDER — MIDAZOLAM HCL 5 MG/5ML IJ SOLN
INTRAMUSCULAR | Status: DC | PRN
  Administered 2020-10-09: 2 mg via INTRAVENOUS

## 2020-10-09 MED ORDER — FENTANYL CITRATE (PF) 100 MCG/2ML IJ SOLN
25.0000 ug | INTRAMUSCULAR | Status: DC | PRN
  Administered 2020-10-09: 50 ug via INTRAVENOUS

## 2020-10-09 MED ORDER — LIDOCAINE-EPINEPHRINE 1 %-1:100000 IJ SOLN
INTRAMUSCULAR | Status: DC | PRN
  Administered 2020-10-09: 3 mL

## 2020-10-09 MED ORDER — ALBUTEROL SULFATE (2.5 MG/3ML) 0.083% IN NEBU: 2.5000 mg | INHALATION_SOLUTION | Freq: Four times a day (QID) | RESPIRATORY_TRACT | Status: DC | PRN

## 2020-10-09 MED ORDER — CHLORHEXIDINE GLUCONATE 0.12 % MT SOLN
15.0000 mL | Freq: Once | OROMUCOSAL | Status: AC
  Administered 2020-10-09: 15 mL via OROMUCOSAL
  Filled 2020-10-09: qty 15

## 2020-10-09 MED ORDER — FENTANYL CITRATE (PF) 100 MCG/2ML IJ SOLN
INTRAMUSCULAR | Status: AC
  Filled 2020-10-09: qty 2

## 2020-10-09 MED ORDER — PANTOPRAZOLE SODIUM 40 MG PO TBEC
40.0000 mg | DELAYED_RELEASE_TABLET | Freq: Every day | ORAL | Status: DC
  Administered 2020-10-10: 40 mg via ORAL
  Filled 2020-10-09: qty 1

## 2020-10-09 MED ORDER — HYDROCODONE-ACETAMINOPHEN 5-325 MG PO TABS
1.0000 | ORAL_TABLET | ORAL | Status: DC | PRN
  Administered 2020-10-09 – 2020-10-10 (×4): 1 via ORAL
  Filled 2020-10-09 (×4): qty 1

## 2020-10-09 MED ORDER — DEXAMETHASONE SODIUM PHOSPHATE 10 MG/ML IJ SOLN
INTRAMUSCULAR | Status: AC
  Filled 2020-10-09: qty 1

## 2020-10-09 MED ORDER — KETAMINE HCL 10 MG/ML IJ SOLN
INTRAMUSCULAR | Status: DC | PRN
  Administered 2020-10-09: 50 mg via INTRAVENOUS

## 2020-10-09 MED ORDER — MENTHOL 3 MG MT LOZG: 1.0000 | LOZENGE | OROMUCOSAL | Status: DC | PRN

## 2020-10-09 MED ORDER — PHENYLEPHRINE 40 MCG/ML (10ML) SYRINGE FOR IV PUSH (FOR BLOOD PRESSURE SUPPORT)
PREFILLED_SYRINGE | INTRAVENOUS | Status: DC | PRN
  Administered 2020-10-09 (×2): 80 ug via INTRAVENOUS
  Administered 2020-10-09 (×2): 120 ug via INTRAVENOUS

## 2020-10-09 MED ORDER — SEMAGLUTIDE 14 MG PO TABS: 14.0000 mg | ORAL_TABLET | Freq: Every day | ORAL | Status: DC

## 2020-10-09 MED ORDER — CHLORHEXIDINE GLUCONATE CLOTH 2 % EX PADS: 6.0000 | MEDICATED_PAD | Freq: Once | CUTANEOUS | Status: DC

## 2020-10-09 MED ORDER — PHENOL 1.4 % MT LIQD: 1.0000 | OROMUCOSAL | Status: DC | PRN

## 2020-10-09 MED ORDER — SERTRALINE HCL 50 MG PO TABS
200.0000 mg | ORAL_TABLET | Freq: Every day | ORAL | Status: DC
  Administered 2020-10-09: 200 mg via ORAL
  Filled 2020-10-09: qty 4

## 2020-10-09 MED ORDER — FLEET ENEMA 7-19 GM/118ML RE ENEM: 1.0000 | ENEMA | Freq: Once | RECTAL | Status: DC | PRN

## 2020-10-09 MED ORDER — LACTATED RINGERS IV SOLN: INTRAVENOUS | Status: DC

## 2020-10-09 MED ORDER — ROSUVASTATIN CALCIUM 20 MG PO TABS
40.0000 mg | ORAL_TABLET | Freq: Every day | ORAL | Status: DC
  Administered 2020-10-09: 40 mg via ORAL
  Filled 2020-10-09: qty 2

## 2020-10-09 MED ORDER — INSULIN GLARGINE 100 UNIT/ML ~~LOC~~ SOLN
66.0000 [IU] | Freq: Every day | SUBCUTANEOUS | Status: DC
  Administered 2020-10-09: 66 [IU] via SUBCUTANEOUS
  Filled 2020-10-09 (×2): qty 0.66

## 2020-10-09 MED ORDER — 0.9 % SODIUM CHLORIDE (POUR BTL) OPTIME
TOPICAL | Status: DC | PRN
  Administered 2020-10-09: 1000 mL

## 2020-10-09 MED ORDER — ONDANSETRON HCL 4 MG PO TABS: 4.0000 mg | ORAL_TABLET | Freq: Four times a day (QID) | ORAL | Status: DC | PRN

## 2020-10-09 MED ORDER — INSULIN ASPART 100 UNIT/ML IJ SOLN: 0.0000 [IU] | Freq: Every day | INTRAMUSCULAR | Status: DC

## 2020-10-09 MED ORDER — LIDOCAINE 2% (20 MG/ML) 5 ML SYRINGE
INTRAMUSCULAR | Status: DC | PRN
  Administered 2020-10-09: 60 mg via INTRAVENOUS

## 2020-10-09 MED ORDER — KETAMINE HCL 50 MG/5ML IJ SOSY
PREFILLED_SYRINGE | INTRAMUSCULAR | Status: AC
  Filled 2020-10-09: qty 5

## 2020-10-09 MED ORDER — FENTANYL CITRATE (PF) 250 MCG/5ML IJ SOLN
INTRAMUSCULAR | Status: DC | PRN
  Administered 2020-10-09 (×2): 50 ug via INTRAVENOUS
  Administered 2020-10-09: 150 ug via INTRAVENOUS
  Administered 2020-10-09 (×5): 50 ug via INTRAVENOUS

## 2020-10-09 MED ORDER — LIDOCAINE-EPINEPHRINE 1 %-1:100000 IJ SOLN
INTRAMUSCULAR | Status: AC
  Filled 2020-10-09: qty 1

## 2020-10-09 MED ORDER — PROPOFOL 10 MG/ML IV BOLUS
INTRAVENOUS | Status: AC
  Filled 2020-10-09: qty 20

## 2020-10-09 MED ORDER — ONDANSETRON HCL 4 MG/2ML IJ SOLN
INTRAMUSCULAR | Status: AC
  Filled 2020-10-09: qty 2

## 2020-10-09 MED ORDER — PHENYLEPHRINE HCL-NACL 10-0.9 MG/250ML-% IV SOLN
INTRAVENOUS | Status: DC | PRN
  Administered 2020-10-09: 40 ug/min via INTRAVENOUS

## 2020-10-09 MED ORDER — OXYCODONE HCL 5 MG/5ML PO SOLN: 5.0000 mg | Freq: Once | ORAL | Status: DC | PRN

## 2020-10-09 MED ORDER — NYSTATIN 100000 UNIT/GM EX CREA
1.0000 "application " | TOPICAL_CREAM | Freq: Two times a day (BID) | CUTANEOUS | Status: DC | PRN
  Filled 2020-10-09: qty 15

## 2020-10-09 MED ORDER — GABAPENTIN 300 MG PO CAPS
300.0000 mg | ORAL_CAPSULE | Freq: Three times a day (TID) | ORAL | Status: DC
  Administered 2020-10-09 – 2020-10-10 (×2): 300 mg via ORAL
  Filled 2020-10-09 (×2): qty 1

## 2020-10-09 MED ORDER — INSULIN DEGLUDEC 200 UNIT/ML ~~LOC~~ SOPN: 66.0000 [IU] | PEN_INJECTOR | Freq: Every day | SUBCUTANEOUS | Status: DC

## 2020-10-09 MED ORDER — DEXAMETHASONE SODIUM PHOSPHATE 10 MG/ML IJ SOLN
INTRAMUSCULAR | Status: DC | PRN
  Administered 2020-10-09: 10 mg via INTRAVENOUS

## 2020-10-09 MED ORDER — HYDROMORPHONE HCL 1 MG/ML IJ SOLN: 0.5000 mg | INTRAMUSCULAR | Status: DC | PRN

## 2020-10-09 MED ORDER — DOCUSATE SODIUM 100 MG PO CAPS
100.0000 mg | ORAL_CAPSULE | Freq: Two times a day (BID) | ORAL | Status: DC
  Administered 2020-10-09 – 2020-10-10 (×2): 100 mg via ORAL
  Filled 2020-10-09 (×2): qty 1

## 2020-10-09 MED ORDER — CEFAZOLIN SODIUM-DEXTROSE 2-4 GM/100ML-% IV SOLN
2.0000 g | Freq: Three times a day (TID) | INTRAVENOUS | Status: AC
  Administered 2020-10-09 – 2020-10-10 (×2): 2 g via INTRAVENOUS
  Filled 2020-10-09 (×2): qty 100

## 2020-10-09 MED ORDER — HYDROXYZINE HCL 25 MG PO TABS
25.0000 mg | ORAL_TABLET | Freq: Three times a day (TID) | ORAL | Status: DC | PRN
  Administered 2020-10-10: 25 mg via ORAL
  Filled 2020-10-09: qty 1

## 2020-10-09 MED ORDER — SODIUM CHLORIDE 0.9 % IV SOLN: 250.0000 mL | INTRAVENOUS | Status: DC

## 2020-10-09 MED ORDER — ONDANSETRON HCL 4 MG/2ML IJ SOLN
INTRAMUSCULAR | Status: DC | PRN
  Administered 2020-10-09: 4 mg via INTRAVENOUS

## 2020-10-09 MED ORDER — POTASSIUM CHLORIDE IN NACL 20-0.9 MEQ/L-% IV SOLN: INTRAVENOUS | Status: DC

## 2020-10-09 MED ORDER — ACETAMINOPHEN 325 MG PO TABS
650.0000 mg | ORAL_TABLET | ORAL | Status: DC | PRN
Start: 2020-10-09 — End: 2020-10-10

## 2020-10-09 MED ORDER — LAMOTRIGINE 150 MG PO TABS
150.0000 mg | ORAL_TABLET | Freq: Two times a day (BID) | ORAL | Status: DC
  Administered 2020-10-09 – 2020-10-10 (×2): 150 mg via ORAL
  Filled 2020-10-09 (×4): qty 1

## 2020-10-09 SURGICAL SUPPLY — 74 items
APL SKNCLS STERI-STRIP NONHPOA (GAUZE/BANDAGES/DRESSINGS) ×1
BAND INSRT 18 STRL LF DISP RB (MISCELLANEOUS) ×2
BAND RUBBER #18 3X1/16 STRL (MISCELLANEOUS) ×4 IMPLANT
BASKET BONE COLLECTION (BASKET) IMPLANT
BENZOIN TINCTURE PRP APPL 2/3 (GAUZE/BANDAGES/DRESSINGS) ×2 IMPLANT
BIT DRILL NEURO 2X3.1 SFT TUCH (MISCELLANEOUS) ×1 IMPLANT
BIT DRILL OZARK SU 2.3X14 (DRILL) IMPLANT
BLADE CLIPPER SURG (BLADE) IMPLANT
BLADE SURG 15 STRL LF DISP TIS (BLADE) IMPLANT
BLADE SURG 15 STRL SS (BLADE)
BLADE ULTRA TIP 2M (BLADE) IMPLANT
BUR MATCHSTICK NEURO 3.0 LAGG (BURR) ×2 IMPLANT
CANISTER SUCT 3000ML PPV (MISCELLANEOUS) ×2 IMPLANT
COLLAR CERV LO CONTOUR FIRM DE (SOFTGOODS) IMPLANT
COVER WAND RF STERILE (DRAPES) ×1 IMPLANT
DECANTER SPIKE VIAL GLASS SM (MISCELLANEOUS) ×2 IMPLANT
DRAPE C-ARM 42X72 X-RAY (DRAPES) ×4 IMPLANT
DRAPE LAPAROTOMY 100X72 PEDS (DRAPES) ×2 IMPLANT
DRAPE MICROSCOPE LEICA (MISCELLANEOUS) ×2 IMPLANT
DRAPE SHEET LG 3/4 BI-LAMINATE (DRAPES) ×2 IMPLANT
DRESSING MEPILEX FLEX 4X4 (GAUZE/BANDAGES/DRESSINGS) ×1 IMPLANT
DRILL NEURO 2X3.1 SOFT TOUCH (MISCELLANEOUS) ×2
DRILL OZARK SU 2.3X14 (DRILL) ×2
DRSG MEPILEX FLEX 4X4 (GAUZE/BANDAGES/DRESSINGS)
DRSG OPSITE 4X5.5 SM (GAUZE/BANDAGES/DRESSINGS) IMPLANT
DRSG OPSITE POSTOP 3X4 (GAUZE/BANDAGES/DRESSINGS) ×1 IMPLANT
DRSG OPSITE POSTOP 4X6 (GAUZE/BANDAGES/DRESSINGS) ×2 IMPLANT
DURAPREP 26ML APPLICATOR (WOUND CARE) ×2 IMPLANT
ELECT COATED BLADE 2.86 ST (ELECTRODE) ×2 IMPLANT
ELECT REM PT RETURN 9FT ADLT (ELECTROSURGICAL) ×2
ELECTRODE REM PT RTRN 9FT ADLT (ELECTROSURGICAL) ×1 IMPLANT
EVACUATOR 1/8 PVC DRAIN (DRAIN) ×1 IMPLANT
GAUZE 4X4 16PLY RFD (DISPOSABLE) IMPLANT
GLOVE BIOGEL PI IND STRL 7.5 (GLOVE) ×1 IMPLANT
GLOVE BIOGEL PI INDICATOR 7.5 (GLOVE) ×1
GLOVE ECLIPSE 7.5 STRL STRAW (GLOVE) IMPLANT
GLOVE SURG POLYISO LF SZ7 (GLOVE) ×3 IMPLANT
GLOVE SURG POLYISO LF SZ7.5 (GLOVE) ×1 IMPLANT
GLOVE SURG UNDER POLY LF SZ7.5 (GLOVE) ×3 IMPLANT
GOWN STRL REUS W/ TWL LRG LVL3 (GOWN DISPOSABLE) ×2 IMPLANT
GOWN STRL REUS W/ TWL XL LVL3 (GOWN DISPOSABLE) ×2 IMPLANT
GOWN STRL REUS W/TWL 2XL LVL3 (GOWN DISPOSABLE) IMPLANT
GOWN STRL REUS W/TWL LRG LVL3 (GOWN DISPOSABLE)
GOWN STRL REUS W/TWL XL LVL3 (GOWN DISPOSABLE) ×4
HEMOSTAT POWDER KIT SURGIFOAM (HEMOSTASIS) ×2 IMPLANT
IMPL CERV LORD 12X14X6 7D (Neuro Prosthesis/Implant) IMPLANT
IMPLANT CERV LORD 12X14X6 7D (Neuro Prosthesis/Implant) ×2 IMPLANT
KIT BASIN OR (CUSTOM PROCEDURE TRAY) ×2 IMPLANT
KIT TURNOVER KIT B (KITS) ×2 IMPLANT
NDL SPNL 22GX3.5 QUINCKE BK (NEEDLE) ×1 IMPLANT
NEEDLE HYPO 22GX1.5 SAFETY (NEEDLE) ×2 IMPLANT
NEEDLE SPNL 22GX3.5 QUINCKE BK (NEEDLE) ×2 IMPLANT
NS IRRIG 1000ML POUR BTL (IV SOLUTION) ×2 IMPLANT
PACK LAMINECTOMY NEURO (CUSTOM PROCEDURE TRAY) ×2 IMPLANT
PAD ARMBOARD 7.5X6 YLW CONV (MISCELLANEOUS) ×6 IMPLANT
PATTIES SURGICAL .5 X3 (DISPOSABLE) ×1 IMPLANT
PIN DISTRACTION 14MM (PIN) ×4 IMPLANT
PLATE CERV CONS OZARK 1X20 (Plate) ×1 IMPLANT
PUTTY BONE 100 VESUVIUS 1CC (Putty) ×2 IMPLANT
SCREW VA ST OZARK 4X14 (Screw) ×8 IMPLANT
SPONGE INTESTINAL PEANUT (DISPOSABLE) ×2 IMPLANT
SPONGE SURGIFOAM ABS GEL SZ50 (HEMOSTASIS) IMPLANT
STAPLER VISISTAT 35W (STAPLE) IMPLANT
STRIP CLOSURE SKIN 1/2X4 (GAUZE/BANDAGES/DRESSINGS) ×2 IMPLANT
SUT MNCRL AB 4-0 PS2 18 (SUTURE) ×2 IMPLANT
SUT SILK 2 0 TIES 10X30 (SUTURE) ×1 IMPLANT
SUT VIC AB 0 CT1 27 (SUTURE)
SUT VIC AB 0 CT1 27XBRD ANTBC (SUTURE) IMPLANT
SUT VIC AB 2-0 CP2 18 (SUTURE) ×2 IMPLANT
SUT VIC AB 3-0 SH 8-18 (SUTURE) ×2 IMPLANT
TAPE CLOTH 3X10 TAN LF (GAUZE/BANDAGES/DRESSINGS) ×2 IMPLANT
TOWEL GREEN STERILE (TOWEL DISPOSABLE) ×2 IMPLANT
TOWEL GREEN STERILE FF (TOWEL DISPOSABLE) ×2 IMPLANT
WATER STERILE IRR 1000ML POUR (IV SOLUTION) ×2 IMPLANT

## 2020-10-09 NOTE — Anesthesia Procedure Notes (Signed)
Procedure Name: Intubation Date/Time: 10/09/2020 4:04 PM Performed by: Jenne Campus, CRNA Pre-anesthesia Checklist: Patient identified, Emergency Drugs available, Suction available and Patient being monitored Patient Re-evaluated:Patient Re-evaluated prior to induction Oxygen Delivery Method: Circle System Utilized Preoxygenation: Pre-oxygenation with 100% oxygen Induction Type: IV induction Ventilation: Mask ventilation without difficulty Laryngoscope Size: Glidescope and 4 Grade View: Grade I Tube type: Oral Tube size: 7.5 mm Number of attempts: 1 Airway Equipment and Method: Stylet and Video-laryngoscopy Placement Confirmation: ETT inserted through vocal cords under direct vision,  positive ETCO2 and breath sounds checked- equal and bilateral Secured at: 21 cm Tube secured with: Tape Dental Injury: Teeth and Oropharynx as per pre-operative assessment

## 2020-10-09 NOTE — Op Note (Signed)
PREOP DIAGNOSIS: Cervical stenosis with myelopathy  POSTOP DIAGNOSIS: Cervical stenosis with myelopathy   PROCEDURE: 1. Arthrodesis C5-6, anterior interbody technique, including Discectomy for decompression of spinal cord and exiting nerve roots with foraminotomies  2. Placement of intervertebral biomechanical device C5-6 3. Placement of anterior instrumentation consisting of interbody plate and screws B1-5 4. Use of morselized bone allograft  5. Use of intraoperative microscope  SURGEON: Dr. Duffy Rhody, MD  ASSISTANT: None  ANESTHESIA: General Endotracheal  EBL: 25 ml  IMPLANTS: [employer]ryker  6 x 12 x 14 mm 7 degree lordotic [place] C cage 4.0x1mm ST variable screws x 14 20 mm [place] Plate  SPECIMENS: None  DRAINS: Deep cervical Hemovac drain  COMPLICATIONS: None immediate  CONDITION: Hemodynamically stable to PACU  HISTORY: Judith [name] is a 50 y.o. y.o. female with diabetes, obesity, and previous history of goiter surgery who presented with progressive cervical myelopathy and was found to have significant stenosis with mild cord signal change at C5-6.  Nonsurgical measures including physical therapy failed to alleviate her symptoms which were worsening.  Risks, benefits, alternatives, and expected convalescence were discussed with the patient.  She had preoperative right laryngeal palsy as well as dysphagia.  However, with her focal kyphosis at C5-6 and purely anterior compression, and ACDF was much preferred over a posterior surgery.  Risks discussed included but were not limited to bleeding, pain, infection, pseudoarthrosis, hardware failure, adjacent segment disease, CSF leak, neurologic deficits, weakness, numbness, paralysis, coma, and death. After all questions were answered, informed consent was obtained.    PROCEDURE IN DETAIL: The patient was brought to the operating room and transferred to the operative table. After induction of general anesthesia, the patient was  positioned on the operative table in the supine position with all pressure points meticulously padded. The skin of the neck was then prepped and draped in the usual sterile fashion.  After timeout was conducted, the skin was infiltrated with local anesthetic. Skin incision was then made sharply and Bovie electrocautery was used to dissect the subcutaneous tissue until the platysma was identified. The platysma was then divided and undermined.  There was a fair amount of scar in the deep cervical fascia which was quite fused to the thin platysma.  Branches to a large external jugular vein were coagulated and cut.  The carotid artery was identified and just medial to that, the investing fascia was opened. the prevertebral fascia was identified and the carotid sheath was retracted laterally and the trachea and esophagus retracted medially. Again using fluoroscopy, the C5-6 disc space was identified. Bovie electrocautery was used to dissect in the subperiosteal plane and elevate the bilateral longus coli muscles. Self-retaining retractors were then placed. Caspar distraction pins were placed in the adjacent bodies to allow for gentle distraction.  At this point, the microscope was draped and brought into the field, and the remainder of the case was done under the microscope using microdissecting technique.  The disc space was incised sharply and combination of high speed drill, curettes, and rongeurs were use to initially complete a discectomy. The high-speed drill was then used to complete discectomy until the posterior annulus was identified and removed and the posterior longitudinal ligament was identified. Using a nerve hook, the PLL was elevated, and Kerrison rongeurs were used to remove the posterior longitudinal ligament and the ventral thecal sac was identified. Using a combination of curettes and rongeurs, complete decompression of the thecal sac and exiting nerve roots at this level was completed, and  verified with easy passage of micro-nerve hook centrally and in the bilateral foramina.  Having completed our decompression, attention was turned to placement of the intervertebral device. Trial spacers were used to select a size 6 mm graft. This graft was then filled with morcellized allograft, and inserted under live fluoroscopy.  After placement of the intervertebral device, the caspar pins were removed.  An anterior cervical plate was placed across the interspaces for anterior fixation.  Using a high-speed drill, the cortex of the cervical vertebral bodies was punctured, and screws inserted in the vertebral bodies. Final fluoroscopic images in AP and lateral projections were taken to confirm good hardware placement.  At this point, after all counts were verified to be correct, meticulous hemostasis was secured using a combination of bipolar electrocautery and passive hemostatics.  A medium Hemovac drain was placed in the wound and tunneled out the skin.  The platysma muscle was then closed using interrupted 3-0 Vicryl sutures, and the skin was closed with a 4-0 monocryl in subcutical fashion. Sterile dressings were then applied and the drapes removed.  The patient tolerated the procedure well and was extubated in the room and taken to the postanesthesia care unit in stable condition.  All counts were correct at the end of the procedure.

## 2020-10-09 NOTE — H&P (Signed)
CC: Cervical stenosis  HPI:     Patient is a 50 y.o. female with obesity, hx of OA, DM, dysphagia, goiter resection who c/o neck pain and bilateral hand numbness and weakness.  PT failed to improve her symptoms.  On exam, she had early myelopathy and her MRI showed moderate-severe stenosis at C5-6 with focal kyphosis.  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    Patient Active Problem List   Diagnosis Date Noted  . Mild intermittent asthma with acute exacerbation 10/04/2020  . Esophageal dysphagia   . Patellofemoral pain syndrome of both knees 04/19/2020  . Tinnitus of right ear 03/15/2020  . Closed fracture of one rib of left side 03/15/2020  . Concussion with no loss of consciousness 02/05/2020  . Spondylosis of cervical spine 02/05/2020  . Acute bilateral thoracic back pain 02/05/2020  . Acute bilateral low back pain without sciatica 02/05/2020  . Gastroparesis 11/03/2019  . Hospital discharge follow-up 11/03/2019  . DDD (degenerative disc disease), lumbar 06/30/2019  . Lumbar back pain with radiculopathy affecting lower extremity 06/20/2019  . 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/21/2019  . History of left knee replacement 02/10/2018  . Morbid obesity ([place]) 11/21/2017  . 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 Episodic mood disorder ([place]) 05/12/2016  . Controlled diabetes mellitus type 2 with complications ([place]) 87/68/1157  . Gastroesophageal reflux disease without esophagitis 05/12/2016  . DYSPNEA 07/22/2010  . CHEST PAIN 07/22/2010  . Hypothyroidism 09/13/2007  . Hyperlipidemia 09/13/2007  . Depression, major, single episode, moderate ([place]) 09/13/2007  . MIGRAINE HEADACHE 09/13/2007  . Essential hypertension 09/13/2007  . Asthma 09/13/2007  . CHOLELITHIASIS, HX OF 09/13/2007  . NEPHROLITHIASIS, HX OF 09/13/2007  . CARPAL TUNNEL RELEASE, HX OF 09/13/2007   Past Medical  History:  Diagnosis Date  . Allergy   . Anxiety   . Arthritis   . Asthma   . Bipolar 1 disorder ([place])   . Blood transfusion without reported diagnosis    with child birth 13 yrs ago   . Bronchitis, chronic ([place])   . Constipation   . COVID-19 virus infection 06/21/2020  . DDD (degenerative disc disease), cervical   . Depression   . Diabetes mellitus without complication ([place])   . Dysphagia   . Family history of adverse reaction to anesthesia    mother had a hard time waking up after knee surgery in 2021  . Gastroparesis   . GERD (gastroesophageal reflux disease)   . History of kidney stones   . History of MRSA infection   . Hyperlipemia   . Hypothyroid   . Migraines   . Pneumonia   . Sleep apnea    no cpap     Past Surgical History:  Procedure Laterality Date  . ABDOMINAL SURGERY    . CARPAL TUNNEL RELEASE Bilateral 1999  . CESAREAN SECTION    . CHOLECYSTECTOMY    . COLONOSCOPY    . ESOPHAGEAL MANOMETRY N/A 07/08/2020   Procedure: ESOPHAGEAL MANOMETRY (EM);  Surgeon: Yetta Flock, MD;  Location: [facility];  Service: Gastroenterology;  Laterality: N/A;  . REPLACEMENT TOTAL KNEE Bilateral   . THYROIDECTOMY    . TONSILLECTOMY    . TONSILLECTOMY AND ADENOIDECTOMY    . UPPER GASTROINTESTINAL ENDOSCOPY      Facility-Administered Medications Prior to Admission  Medication Dose Route Frequency Provider Last Rate Last Admin  . medroxyPROGESTERone (DEPO-PROVERA) injection 150 mg  150 mg Intramuscular  Q90 days Ronnie Doss M, DO   150 mg at 09/10/20 1093   Medications Prior to Admission  Medication Sig Dispense Refill Last Dose  . albuterol (PROVENTIL) (2.5 MG/3ML) 0.083% nebulizer solution Take 3 mLs (2.5 mg total) by nebulization every 6 (six) hours as needed for wheezing or shortness of breath. Fill upon patient request 75 mL 12 10/08/2020 at Unknown time  . albuterol (VENTOLIN HFA) 108 (90 Base) MCG/ACT inhaler Inhale 2 puffs into the lungs every 6 (six) hours  as needed for wheezing or shortness of breath. Fill upon patient request 8 g 1 10/08/2020  . budesonide-formoterol (SYMBICORT) 160-4.5 MCG/ACT inhaler Inhale 2 puffs into the lungs 2 (two) times daily. 1 each 3 10/09/2020 at 1000  . cyclobenzaprine (FLEXERIL) 10 MG tablet Take 1 tablet (10 mg total) by mouth 3 (three) times daily as needed for muscle spasms. (Patient taking differently: Take 10 mg by mouth 3 (three) times daily.) 60 tablet 5 10/08/2020 at Unknown time  . dexlansoprazole (DEXILANT) 60 MG capsule TAKE 1 CAPSULE BY MOUTH EVERY DAY (Patient taking differently: Take 60 mg by mouth at bedtime.) 30 capsule 3 10/08/2020 at Unknown time  . doxycycline (VIBRAMYCIN) 100 MG capsule Take one cap PO Q12hr with food. 14 capsule 0   . gabapentin (NEURONTIN) 300 MG capsule Take 1 capsule (300 mg total) by mouth at bedtime for 3 days, THEN 1 capsule (300 mg total) 2 (two) times daily for 3 days, THEN 1 capsule (300 mg total) 3 (three) times daily. (Patient taking differently: 1 capsule (300 mg total) 3 (three) times daily.) 90 capsule 3 10/09/2020 at 0900  . guaiFENesin-codeine 100-10 MG/5ML syrup Take 10 mLs by mouth 3 (three) times daily as needed for cough. (Patient taking differently: Take 10 mLs by mouth at bedtime.) 50 mL 0 10/08/2020 at Unknown time  . hydrOXYzine (ATARAX/VISTARIL) 25 MG tablet TAKE 1 TABLET BY MOUTH THREE TIMES A DAY (Patient taking differently: Take 25 mg by mouth every 8 (eight) hours as needed for itching or anxiety.) 270 tablet 1 Past Month at Unknown time  . insulin degludec (TRESIBA FLEXTOUCH) 200 UNIT/ML FlexTouch Pen Inject 66 Units into the skin at bedtime. (Patient taking differently: Inject 66 Units into the skin at bedtime.) 15 mL 3 10/08/2020 at Unknown time  . lamoTRIgine (LAMICTAL) 150 MG tablet Take 1 tablet (150 mg total) by mouth 2 (two) times daily. 60 tablet 2 10/09/2020 at 0900  . levothyroxine (SYNTHROID) 125 MCG tablet Take 1 tablet (125 mcg total) by mouth daily.  (Patient taking differently: Take 125 mcg by mouth at bedtime.) 90 tablet 1 10/08/2020 at Unknown time  . linaclotide (LINZESS) 72 MCG capsule Take 1 capsule (72 mcg total) by mouth daily before breakfast. (Patient taking differently: Take 72 mcg by mouth daily as needed (constipation).) 30 capsule 2   . lisdexamfetamine (VYVANSE) 70 MG capsule Take 1 capsule (70 mg total) by mouth daily. (Patient taking differently: Take 70 mg by mouth in the morning.) 30 capsule 0 10/08/2020 at Unknown time  . medroxyPROGESTERone (DEPO-PROVERA) 150 MG/ML injection Inject 1 mL (150 mg total) into the muscle every 3 (three) months. 1 mL 3 Past Month at Unknown time  . montelukast (SINGULAIR) 10 MG tablet TAKE ONE TAB BY MOUTH AT BEDTIME (Patient taking differently: Take 10 mg by mouth at bedtime.) 90 tablet 3 10/08/2020 at Unknown time  . nystatin cream (MYCOSTATIN) Apply 1 application topically 2 (two) times daily. x2 weeks (Patient taking differently: Apply 1 application  topically 2 (two) times daily as needed (rash).) 60 g 0   . ondansetron (ZOFRAN) 4 MG tablet Take 1 tablet (4 mg total) by mouth every 8 (eight) hours as needed for nausea or vomiting. 20 tablet 0   . pioglitazone (ACTOS) 30 MG tablet Take 1 tablet (30 mg total) by mouth daily. (Patient taking differently: Take 30 mg by mouth at bedtime.) 90 tablet 3 10/08/2020 at Unknown time  . promethazine (PHENERGAN) 25 MG tablet Take 1 tablet (25 mg total) by mouth every 6 (six) hours as needed for nausea or vomiting. 30 tablet 0   . rosuvastatin (CRESTOR) 40 MG tablet Take 1 tablet (40 mg total) by mouth daily. (Patient taking differently: Take 40 mg by mouth at bedtime.) 90 tablet 3 10/08/2020 at Unknown time  . Semaglutide (RYBELSUS) 14 MG TABS Take 1 tablet by mouth daily before breakfast. (Patient taking differently: Take 14 mg by mouth daily before breakfast.) 30 tablet prn 10/08/2020 at Unknown time  . sertraline (ZOLOFT) 100 MG tablet Take 2 tablets (200 mg  total) by mouth daily. (Patient taking differently: Take 200 mg by mouth at bedtime.) 180 tablet 3 10/08/2020 at Unknown time  . Accu-Chek FastClix Lancets MISC Test blood sugars daily Dx E11.9 102 each 3   . Blood Glucose Monitoring Suppl (ACCU-CHEK GUIDE ME) w/Device KIT Use to check BS BID and as needed. DX: E11.9 1 kit 0   . Continuous Blood Gluc Receiver (FREESTYLE LIBRE READER) DEVI 1 Units by Does not apply route daily. UAD to test BGs daily. Dx E11.40 1 each 1   . Continuous Blood Gluc Sensor (FREESTYLE LIBRE 14 DAY SENSOR) MISC 1 Units by Does not apply route daily. UAD to check blood sugars R73.09 2 each 12   . diclofenac Sodium (VOLTAREN) 1 % GEL Apply 4 g topically 4 (four) times daily. For knee pain (Patient taking differently: Apply 4 g topically 4 (four) times daily as needed (knee pain).) 400 g 2 More than a month at Unknown time  . dicyclomine (BENTYL) 20 MG tablet TAKE 1 TABLET (20 MG TOTAL) BY MOUTH EVERY 6 (SIX) HOURS. (Patient taking differently: Take 20 mg by mouth every 6 (six) hours as needed for spasms (abdominal cramps).) 360 tablet 0 More than a month at Unknown time  . glucose blood (ACCU-CHEK GUIDE) test strip Use as instructed E11.9 100 strip 12   . Insulin Pen Needle 31G X 5 MM MISC 1 Units by Does not apply route daily. 100 each 5   . [START ON 10/26/2020] lisdexamfetamine (VYVANSE) 70 MG capsule Take 1 capsule (70 mg total) by mouth daily. (Patient not taking: Reported on 10/04/2020) 30 capsule 0 Not Taking at Unknown time  . lisdexamfetamine (VYVANSE) 70 MG capsule Take 1 capsule (70 mg total) by mouth daily. (Patient not taking: Reported on 10/04/2020) 30 capsule 0 Not Taking at Unknown time   Allergies  Allergen Reactions  . Diphenhydramine Other (See Comments)    Hair feels like it is crawling  . Morphine Nausea And Vomiting    PCA PUMP/ DRIP -- N/V IV PUSH IN ER NO PROBLEM PER PT.  . Prednisone Nausea And Vomiting  . Adhesive [Tape]     Paper tape causes  irritation   . Erythromycin Hives  . Metformin And Related     Diarrhea with IR and XR products  . Sulfonamide Derivatives Other (See Comments)    Does not take due to family history    Social History  Tobacco Use  . Smoking status: Former Smoker    Packs/day: 1.00    Years: 5.00    Pack years: 5.00    Quit date: 04/04/1993    Years since quitting: 27.5  . Smokeless tobacco: Never Used  . Tobacco comment: smoked when went out with friends/social smoker. pack would last a week.  Substance Use Topics  . Alcohol use: No    Family History  Problem Relation Age of Onset  . Mental illness Other   . Diabetes Other   . Healthy Mother   . Cancer Father   . Non-Hodgkin's lymphoma Father   . Breast cancer Paternal Grandmother   . Lung cancer Paternal Grandmother   . Esophageal cancer Other   . Colon cancer Neg Hx   . Stomach cancer Neg Hx   . Pancreatic cancer Neg Hx   . Colon polyps Neg Hx      Review of Systems Pertinent items noted in HPI and remainder of comprehensive ROS otherwise negative.  Objective:   Patient Vitals for the past 8 hrs:  BP Temp Pulse Resp SpO2 Height Weight  10/09/20 1138 135/86 (!) 97.3 F (36.3 C) 97 18 98 % 5' 8" (1.727 m) 135.3 kg   No intake/output data recorded. No intake/output data recorded.      General : Alert, cooperative, no distress, appears stated age   Head:  Normocephalic/atraumatic    Eyes: PERRL, conjunctiva/corneas clear, EOM's intact. Fundi could not be visualized Neck: Supple Chest:  Respirations unlabored Chest wall: no tenderness or deformity Heart: Regular rate and rhythm Abdomen: Soft, nontender and nondistended Extremities: warm and well-perfused Skin: normal turgor, color and texture Neurologic:  Alert, oriented x 3.  Eyes open spontaneously. PERRL, EOMI, VFC, no facial droop. V1-3 intact.  No dysarthria, tongue protrusion symmetric.  CNII-XII intact. Positive Hoffman's, 4/5 T, 4+/5 FExt bilaterally. Poor neck  mobility.  3+ reflexes biceps, triceps and BR.  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  Given the failure of physical therapy and her symptoms which are severely affecting her quality of life, I did discuss with her the option of C5-6 ACDF.  With her history of goiter resection and preoperative dysphagia, there is increased risk with surgery.  However, a posterior decompression and fusion would likely not be nearly as effective given her focal kyphosis.  As such, it would be reasonable to plan for a C5-6 ACDF via the right side.  Risks, benefits, alternatives, and expected convalescence were discussed with her.  Risk discussed included, but were not limited to, bleeding, pain, infection, scar, dysphagia, dysphonia, neurologic deficit, pseudoarthrosis, adjacent segment disease, coma, and death.  All questions and concerns were answered.  She was to proceed with surgery and informed consent was obtained.

## 2020-10-09 NOTE — Transfer of Care (Signed)
Immediate Anesthesia Transfer of Care Note  Patient: Judith [name]  Procedure(s) Performed: Anterior Cervical Discectomy Fusion Cervical five-six (N/A Neck)  Patient Location: PACU  Anesthesia Type:General  Level of Consciousness: drowsy, patient cooperative and responds to stimulation  Airway & Oxygen Therapy: Patient Spontanous Breathing  Post-op Assessment: Report given to RN and Post -op Vital signs reviewed and stable  Post vital signs: Reviewed and stable  Last Vitals:  Vitals Value Taken Time  BP 162/97 10/09/20 1832  Temp    Pulse 110 10/09/20 1833  Resp 21 10/09/20 1833  SpO2 91 % 10/09/20 1833  Vitals shown include unvalidated device data.  Last Pain:  Vitals:   10/09/20 1148  PainSc: 8       Patients Stated Pain Goal: 4 (66/44/03 4742)  Complications: No complications documented.

## 2020-10-10 ENCOUNTER — Other Ambulatory Visit: Payer: Self-pay | Admitting: Family Medicine

## 2020-10-10 ENCOUNTER — Telehealth: Payer: Self-pay | Admitting: Family Medicine

## 2020-10-10 ENCOUNTER — Other Ambulatory Visit: Payer: Self-pay | Admitting: Family

## 2020-10-10 DIAGNOSIS — G8929 Other chronic pain: Secondary | ICD-10-CM

## 2020-10-10 DIAGNOSIS — M5416 Radiculopathy, lumbar region: Secondary | ICD-10-CM

## 2020-10-10 DIAGNOSIS — M4802 Spinal stenosis, cervical region: Secondary | ICD-10-CM | POA: Diagnosis not present

## 2020-10-10 LAB — GLUCOSE, CAPILLARY
Glucose-Capillary: 230 mg/dL — ABNORMAL HIGH (ref 70–99)
Glucose-Capillary: 144 mg/dL — ABNORMAL HIGH (ref 70–99)

## 2020-10-10 MED ORDER — DOCUSATE SODIUM 100 MG PO CAPS
100.0000 mg | ORAL_CAPSULE | Freq: Two times a day (BID) | ORAL | 0 refills | Status: DC
Start: 2020-10-10 — End: 2022-05-12

## 2020-10-10 MED ORDER — HYDROCODONE-ACETAMINOPHEN 5-325 MG PO TABS: 1.0000 | ORAL_TABLET | ORAL | 0 refills | Status: DC | PRN

## 2020-10-10 NOTE — Telephone Encounter (Signed)
Patient aware and states she is not on tramadol anymore.  States she was not able to get her Hydrocodone filled at her pharmacy since it was not prescribed from Dr. Darnell Level and she is the one who prescribes all her controlled medications.   Called [facility] in [place]- pharmacist states that patient is in a "locked in program with her insurance".  Explained that that meant per her insurance she could only get her controlled medications from one physician and they could not fill the Hydrocodone since it was from another physician. Advised that patient could call her insurance and see if it could be over ridded or it could be re sent from our office.  Asked them if we were able to re send from out office if it would go through since it would be from another physician since Dr. Darnell Level is out of office and pharmacist states she is not sure.   Per policy patient would need an appointment to get controlled medication - could it be a phone visit since she had surgery yesterday or does it have to be in office ?   Covering PCP

## 2020-10-10 NOTE — Plan of Care (Signed)
Adequately Ready for Discharge 

## 2020-10-10 NOTE — Telephone Encounter (Signed)
She can take what was prescribed by her neurosurgeon. She did the right thing by letting us know. She should not be taking any Tramadol while on the Hydrocodone.

## 2020-10-10 NOTE — Telephone Encounter (Signed)
Spoke with patient, paperwork refaxed with end of disability date filled in.

## 2020-10-10 NOTE — Discharge Instructions (Signed)
Wound Care Remove dressing in 2-3 days Leave incision open to air. You may shower. Do not scrub directly on incision.  Do not put any creams, lotions, or ointments on incision. Activity Walk each and every day, increasing distance each day. No lifting greater than 5 lbs.  Avoid excessive neck motion. No driving for 2 weeks; may ride as a passenger locally. Wear neck brace at all times except when showering.  If provided soft collar, may wear for comfort unless otherwise instructed. Diet Resume your normal diet.  Return to Work Will be discussed at you follow up appointment. Call Your Doctor If Any of These Occur Redness, drainage, or swelling at the wound.  Temperature greater than 101 degrees. Severe pain not relieved by pain medication. Increased difficulty swallowing. Incision starts to come apart. Follow Up Appt Call  [phone])  for problems.  If you have any hardware placed in your spine, you will need an x-ray before your appointment.

## 2020-10-10 NOTE — Evaluation (Signed)
Physical Therapy Evaluation and Discharge Patient Details Name: Judith [name] MRN: [id number] DOB: 04-Feb-1971 Today's Date: 10/10/2020   History of Present Illness  Pt is a 50 y/o female who presents s/p C5-C6 ACDF on 10/09/2020. PMH significant for OA, DM, dysphagia, goiter resection.    Clinical Impression  Patient evaluated by Physical Therapy with no further acute PT needs identified. All education has been completed and the patient has no further questions. Pt was able to demonstrate transfers and ambulation with gross modified independence and no AD. It appears pt was requiring assistance from fiance PTA so is likely at/near baseline of function. Pt was educated on precautions, brace application/wearing schedule, appropriate activity progression, and car transfer. See below for any follow-up Physical Therapy or equipment needs. PT is signing off. Thank you for this referral.     Follow Up Recommendations No PT follow up;Supervision for mobility/OOB    Equipment Recommendations  None recommended by PT    Recommendations for Other Services       Precautions / Restrictions Precautions Precautions: Fall;Cervical Precaution Booklet Issued: Yes (comment) Required Braces or Orthoses: Cervical Brace Cervical Brace: Soft collar;Other (comment) (off for bathing, off in bed and can ambulate to bathroom without it) Restrictions Weight Bearing Restrictions: No      Mobility  Bed Mobility               General bed mobility comments: sitting EOB on entry. verbally reviewed log roll technique.    Transfers Overall transfer level: Modified independent Equipment used: None             General transfer comment: VC's for improved posture. Min UE use when powering up to full stand.  Ambulation/Gait Ambulation/Gait assistance: Modified independent (Device/Increase time) Gait Distance (Feet): 400 Feet Assistive device: None Gait Pattern/deviations: Step-through pattern;Decreased  stride length;Wide base of support Gait velocity: Decreased Gait velocity interpretation: <1.8 ft/sec, indicate of risk for recurrent falls General Gait Details: Wide BOS with moderate lateral sway, likely due to increased body habitus. No gross unsteadiness noted and no overt LOB throughout gait training.  Stairs Stairs:  (Pt declined stair training as she has a ramp she can use)          Wheelchair Mobility    Modified Rankin (Stroke Patients Only)       Balance Overall balance assessment: Mild deficits observed, not formally tested                                           Pertinent Vitals/Pain Pain Assessment: Faces Faces Pain Scale: Hurts a little bit Pain Location: neck Pain Descriptors / Indicators: Sore Pain Intervention(s): Monitored during session    Home Living Family/patient expects to be discharged to:: Private residence Living Arrangements: Spouse/significant other;Non-relatives/Friends (fiance and roommate) Available Help at Discharge: Family;Friend(s);Available 24 hours/day Type of Home: House Home Access: Stairs to enter;Ramped entrance   Entrance Stairs-Number of Steps: 1 step onto porch Home Layout: One level Home Equipment: Grab bars - tub/shower      Prior Function Level of Independence: Needs assistance   Gait / Transfers Assistance Needed: no use of AD for mobility, uses electric cart in grocery stores due to SOB with long distances  ADL's / Homemaking Assistance Needed: Typically independent with ADLs, but may require assist with bras due to difficulty reaching behind back or LB dressing around R foot at times.  Shares IADLs in the home, enjoys cooking but sometimes limited by pain.        Hand Dominance   Dominant Hand: Right    Extremity/Trunk Assessment   Upper Extremity Assessment Upper Extremity Assessment: Defer to OT evaluation RUE Deficits / Details: reports shoulder soreness after positioning for surgery,  reports sensation feels normal again, fine motor coordination WFL RUE Sensation: WNL RUE Coordination: WNL LUE Deficits / Details: reports shoulder soreness after positioning for surgery, reports sensation feels normal again, fine motor coordination WFL LUE Sensation: WNL LUE Coordination: WNL    Lower Extremity Assessment Lower Extremity Assessment: Generalized weakness    Cervical / Trunk Assessment Cervical / Trunk Assessment: Other exceptions Cervical / Trunk Exceptions: s/p surgery  Communication   Communication: No difficulties  Cognition Arousal/Alertness: Awake/alert Behavior During Therapy: WFL for tasks assessed/performed Overall Cognitive Status: Within Functional Limits for tasks assessed                                        General Comments General comments (skin integrity, edema, etc.): Noted with cervical collar riding up towards chin, assisted in repositioning more firmly at end of session with improved fit noted.    Exercises     Assessment/Plan    PT Assessment Patent does not need any further PT services  PT Problem List         PT Treatment Interventions      PT Goals (Current goals can be found in the Care Plan section)  Acute Rehab PT Goals Patient Stated Goal: go home when able PT Goal Formulation: All assessment and education complete, DC therapy    Frequency     Barriers to discharge        Co-evaluation               AM-PAC PT "6 Clicks" Mobility  Outcome Measure Help needed turning from your back to your side while in a flat bed without using bedrails?: None Help needed moving from lying on your back to sitting on the side of a flat bed without using bedrails?: A Little Help needed moving to and from a bed to a chair (including a wheelchair)?: None Help needed standing up from a chair using your arms (e.g., wheelchair or bedside chair)?: None Help needed to walk in hospital room?: None Help needed climbing  3-5 steps with a railing? : A Little 6 Click Score: 22    End of Session Equipment Utilized During Treatment: Gait belt Activity Tolerance: Patient tolerated treatment well Patient left: with call bell/phone within reach (Sitting EOB) Nurse Communication: Mobility status PT Visit Diagnosis: Unsteadiness on feet (R26.81);Pain Pain - part of body:  (neck)    Time: 1000-1020 PT Time Calculation (min) (ACUTE ONLY): 20 min   Charges:   PT Evaluation $PT Eval Low Complexity: 1 Low          Rolinda Roan, PT, DPT Acute Rehabilitation Services Pager: [phone] Office: [phone]   Thelma Comp 10/10/2020, 10:30 AM

## 2020-10-10 NOTE — Discharge Summary (Signed)
Physician Discharge Summary  Patient ID: Judith [name] MRN: [id number] DOB/AGE: [birth year] 50 y.o.  Admit date: 10/09/2020 Discharge date: 10/10/2020  Admission Diagnoses: Cervical stenosis with myelopathy  Discharge Diagnoses: Same Active Problems:   Cervical myelopathy [facility]   Discharged Condition: Stable  Hospital Course:  Mrs. Judith [name] is a 50 y.o. 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 The patient was admitted for elective C5-6 ACDF which was done without complication. On POD#1 the patient was at her neurologic baseline, reporting relief of her hand numbness.   pain was controlled with oral medication, she was ambulating without difficulty, voiding normally, and tolerating diet.  Treatments: Surgery - C5-6 ACDF  Discharge Exam: Blood pressure (!) 110/51, pulse 79, temperature 98.6 F (37 C), temperature source Oral, resp. rate 18, height $RemoveBe'5\' 8"'ggQQShbVj$  (1.727 m), weight 135.3 kg, SpO2 97 %. Awake, alert, oriented Speech fluent, appropriate CN grossly intact 5/5 BUE/BLE Wound c/d/i  Follow-up: Follow-up in my office [facility] [phone]) in 2-3 weeks  Disposition: Discharge disposition: 01-Home or Self Care       Discharge Instructions    Incentive spirometry RT   Complete by: As directed      Allergies as of 10/10/2020      Reactions   Diphenhydramine Other (See Comments)   Hair feels like it is crawling   Morphine Nausea And Vomiting   PCA PUMP/ DRIP -- N/V IV PUSH IN ER NO PROBLEM PER PT.   Prednisone Nausea And Vomiting   Adhesive [tape]    Paper tape causes irritation    Erythromycin Hives   Metformin And Related    Diarrhea with IR and XR products   Sulfonamide Derivatives Other (See Comments)   Does not take due to family history      Medication List    TAKE these medications   Accu-Chek FastClix Lancets  Misc Test blood sugars daily Dx E11.9   Accu-Chek Guide Me w/Device Kit Use to check BS BID and as needed. DX: E11.9   Accu-Chek Guide test strip Generic drug: glucose blood Use as instructed E11.9   albuterol (2.5 MG/3ML) 0.083% nebulizer solution Commonly known as: PROVENTIL Take 3 mLs (2.5 mg total) by nebulization every 6 (six) hours as needed for wheezing or shortness of breath. Fill upon patient request   albuterol 108 (90 Base) MCG/ACT inhaler Commonly known as: VENTOLIN HFA Inhale 2 puffs into the lungs every 6 (six) hours as needed for wheezing or shortness of breath. Fill upon patient request   budesonide-formoterol 160-4.5 MCG/ACT inhaler Commonly known as: Symbicort Inhale 2 puffs into the lungs 2 (two) times daily.   cyclobenzaprine 10 MG tablet Commonly known as: FLEXERIL Take 1 tablet (10 mg total) by mouth 3 (three) times daily as needed for muscle spasms. What changed: when to take this   dexlansoprazole 60 MG capsule Commonly known as: DEXILANT TAKE 1 CAPSULE BY MOUTH EVERY DAY What changed: when to take this   diclofenac Sodium 1 % Gel Commonly known as: Voltaren Apply 4 g topically 4 (four) times daily. For knee pain What changed:   when to take this  reasons to take this  additional instructions   dicyclomine 20 MG tablet Commonly known as: BENTYL TAKE 1 TABLET (20 MG TOTAL) BY MOUTH EVERY 6 (SIX) HOURS. What changed:   when to take this  reasons to take this   docusate  sodium 100 MG capsule Commonly known as: COLACE Take 1 capsule (100 mg total) by mouth 2 (two) times daily.   doxycycline 100 MG capsule Commonly known as: VIBRAMYCIN Take one cap PO Q12hr with food.   FreeStyle Libre 14 Day Sensor Misc 1 Units by Does not apply route daily. UAD to check blood sugars R73.09   FreeStyle Libre Reader Devi 1 Units by Does not apply route daily. UAD to test BGs daily. Dx E11.40   gabapentin 300 MG capsule Commonly known as:  NEURONTIN Take 1 capsule (300 mg total) by mouth at bedtime for 3 days, THEN 1 capsule (300 mg total) 2 (two) times daily for 3 days, THEN 1 capsule (300 mg total) 3 (three) times daily. Start taking on: August 27, 2020 What changed: See the new instructions.   guaiFENesin-codeine 100-10 MG/5ML syrup Take 10 mLs by mouth 3 (three) times daily as needed for cough. What changed: when to take this   HYDROcodone-acetaminophen 5-325 MG tablet Commonly known as: NORCO/VICODIN Take 1 tablet by mouth every 4 (four) hours as needed for moderate pain ((score 4 to 6)).   hydrOXYzine 25 MG tablet Commonly known as: ATARAX/VISTARIL TAKE 1 TABLET BY MOUTH THREE TIMES A DAY What changed:   when to take this  reasons to take this   Insulin Pen Needle 31G X 5 MM Misc 1 Units by Does not apply route daily.   lamoTRIgine 150 MG tablet Commonly known as: LAMICTAL Take 1 tablet (150 mg total) by mouth 2 (two) times daily.   levothyroxine 125 MCG tablet Commonly known as: SYNTHROID Take 1 tablet (125 mcg total) by mouth daily. What changed: when to take this   linaclotide 72 MCG capsule Commonly known as: Linzess Take 1 capsule (72 mcg total) by mouth daily before breakfast. What changed:   when to take this  reasons to take this   lisdexamfetamine 70 MG capsule Commonly known as: Vyvanse Take 1 capsule (70 mg total) by mouth daily. What changed: when to take this   lisdexamfetamine 70 MG capsule Commonly known as: Vyvanse Take 1 capsule (70 mg total) by mouth daily. What changed: Another medication with the same name was changed. Make sure you understand how and when to take each.   lisdexamfetamine 70 MG capsule Commonly known as: Vyvanse Take 1 capsule (70 mg total) by mouth daily. Start taking on: October 26, 2020 What changed: Another medication with the same name was changed. Make sure you understand how and when to take each.   medroxyPROGESTERone 150 MG/ML injection Commonly  known as: DEPO-PROVERA Inject 1 mL (150 mg total) into the muscle every 3 (three) months.   montelukast 10 MG tablet Commonly known as: SINGULAIR TAKE ONE TAB BY MOUTH AT BEDTIME What changed:   how much to take  how to take this  when to take this  additional instructions   nystatin cream Commonly known as: MYCOSTATIN Apply 1 application topically 2 (two) times daily. x2 weeks What changed:   when to take this  reasons to take this  additional instructions   ondansetron 4 MG tablet Commonly known as: Zofran Take 1 tablet (4 mg total) by mouth every 8 (eight) hours as needed for nausea or vomiting.   pioglitazone 30 MG tablet Commonly known as: Actos Take 1 tablet (30 mg total) by mouth daily. What changed: when to take this   promethazine 25 MG tablet Commonly known as: PHENERGAN Take 1 tablet (25 mg total) by mouth every  6 (six) hours as needed for nausea or vomiting.   rosuvastatin 40 MG tablet Commonly known as: CRESTOR Take 1 tablet (40 mg total) by mouth daily. What changed: when to take this   Rybelsus 14 MG Tabs Generic drug: Semaglutide Take 1 tablet by mouth daily before breakfast. What changed: how much to take   sertraline 100 MG tablet Commonly known as: ZOLOFT Take 2 tablets (200 mg total) by mouth daily. What changed: when to take this   [place] FlexTouch 200 UNIT/ML FlexTouch Pen Generic drug: insulin degludec Inject 66 Units into the skin at bedtime. What changed: when to take this        Signed: Vallarie Mare 10/10/2020, 8:08 AM

## 2020-10-10 NOTE — Telephone Encounter (Signed)
Last office visit 09/20/20 Last refill 09/19/19, #60, 5 refills

## 2020-10-10 NOTE — Telephone Encounter (Signed)
Patient is calling to state that her work has not received her disability paperwork from our office and they will only allow her one more day to get this turned in to them.  Judith [name] was working on this and there was a date left off the form and that is why they need it resent to them corrected.  *Missing item was the Estimated Return to Work date.  Patient states she had surgery 10/09/20 and they said she will need to be out 3 months from the date of surgery.    Can someone please get this faxes asap so she does not lose her disability benefits.  Thank you

## 2020-10-10 NOTE — Telephone Encounter (Signed)
I would be okay with a telephone visit.

## 2020-10-10 NOTE — Anesthesia Postprocedure Evaluation (Signed)
Anesthesia Post Note  Patient: Judith [name]  Procedure(s) Performed: Anterior Cervical Discectomy Fusion Cervical five-six (N/A Neck)     Patient location during evaluation: PACU Anesthesia Type: General Level of consciousness: awake and alert Pain management: pain level controlled Vital Signs Assessment: post-procedure vital signs reviewed and stable Respiratory status: spontaneous breathing, nonlabored ventilation, respiratory function stable and patient connected to nasal cannula oxygen Cardiovascular status: blood pressure returned to baseline and stable Postop Assessment: no apparent nausea or vomiting Anesthetic complications: no   No complications documented.  Last Vitals:  Vitals:   10/10/20 0355 10/10/20 0725  BP: (!) 110/51   Pulse: (!) 102 79  Resp: 20 18  Temp: 37 C   SpO2: 98% 97%    Last Pain:  Vitals:   10/10/20 0557  TempSrc:   PainSc: [place]

## 2020-10-10 NOTE — Evaluation (Signed)
Occupational Therapy Evaluation/Discharge Patient Details Name: Judith [name] MRN: [id number] DOB: 02/20/1971 Today's Date: 10/10/2020    History of Present Illness Pt is a 50 y/o female w/ neck pain and B hand numbness/weakness. MRI showed moderate-severe stenosis at C5-6 w/ kyphosis. Pt failed conservative mgmt and elected to undergo ACDF. PMH: OA, DM, dysphagia, goiter resection.   Clinical Impression   PTA, pt lives with fiance and roommate. Pt reports typically Independent with ADLs though occasionally requires assist for UB/LB dressing due to difficulty reaching and pain. Pt typically completes cooking and grocery shopping but limited by pain and decreased endurance. Pt presents now with expected post-op pain but able to demo mobility in room Independently. Educated on cervical precautions for ADLs/IADLs and adjusted cervical collar for improved fit. Pt Setup for UB ADLs assessed today and Min A for LB ADLs, which appears to be pt's baseline. Educated pt on positional strategies to improve reaching for LB ADLs, as well as use of AE. Pt reports family can assist with these tasks as needed at home. Encouraged use of shower chair for energy conservation and safety, but pt reports DME will not fit in tub/shower setup at home. No further skilled OT services needed at acute level or on DC.     Follow Up Recommendations  No OT follow up;Supervision - Intermittent    Equipment Recommendations  None recommended by OT    Recommendations for Other Services       Precautions / Restrictions Precautions Precautions: Fall;Cervical Precaution Booklet Issued: Yes (comment) Required Braces or Orthoses: Cervical Brace Cervical Brace: Soft collar;Other (comment) (off for bathing, off in bed and can ambulate to bathroom without it) Restrictions Weight Bearing Restrictions: No      Mobility Bed Mobility               General bed mobility comments: sitting EOB on entry    Transfers Overall  transfer level: Independent Equipment used: None                  Balance Overall balance assessment: No apparent balance deficits (not formally assessed)                                         ADL either performed or assessed with clinical judgement   ADL Overall ADL's : Needs assistance/impaired Eating/Feeding: Independent;Sitting   Grooming: Independent;Standing;Oral care Grooming Details (indicate cue type and reason): after education on use of cups/straws and to avoid bending at sink Upper Body Bathing: Independent;Sitting   Lower Body Bathing: Supervison/ safety;Sit to/from stand   Upper Body Dressing : Set up;Sitting Upper Body Dressing Details (indicate cue type and reason): setup to don dress sitting/standing at bedside, reports assist for bras at baseline Lower Body Dressing: Minimal assistance Lower Body Dressing Details (indicate cue type and reason): difficulty reaching R foot - guided in positional strategies for LB dressing by propping foot on bed, educated on use of AE though pt seems disinterested in AE at this time. Pt reports she will have assist at home for LB dressing as needed Toilet Transfer: Independent;Ambulation;Comfort height toilet   Toileting- Clothing Manipulation and Hygiene: Independent;Sit to/from stand         General ADL Comments: Difficulties at baseline due to difficulty reaching R foot, and behind back     Vision Patient Visual Report: No change from baseline Vision Assessment?: No apparent visual  deficits     Perception     Praxis      Pertinent Vitals/Pain Pain Assessment: Faces Faces Pain Scale: Hurts a little bit Pain Location: neck Pain Descriptors / Indicators: Sore Pain Intervention(s): Monitored during session     Hand Dominance Right   Extremity/Trunk Assessment Upper Extremity Assessment Upper Extremity Assessment: RUE deficits/detail;LUE deficits/detail RUE Deficits / Details: reports  shoulder soreness after positioning for surgery, reports sensation feels normal again, fine motor coordination WFL RUE Sensation: WNL RUE Coordination: WNL LUE Deficits / Details: reports shoulder soreness after positioning for surgery, reports sensation feels normal again, fine motor coordination WFL LUE Sensation: WNL LUE Coordination: WNL   Lower Extremity Assessment Lower Extremity Assessment: Defer to PT evaluation   Cervical / Trunk Assessment Cervical / Trunk Assessment: Normal   Communication Communication Communication: No difficulties   Cognition Arousal/Alertness: Awake/alert Behavior During Therapy: WFL for tasks assessed/performed Overall Cognitive Status: Within Functional Limits for tasks assessed                                     General Comments  Noted with cervical collar riding up towards chin, assisted in repositioning more firmly at end of session with improved fit noted.    Exercises     Shoulder Instructions      Home Living Family/patient expects to be discharged to:: Private residence Living Arrangements: Spouse/significant other;Non-relatives/Friends (fiance and roommate) Available Help at Discharge: Family;Friend(s);Available 24 hours/day Type of Home: House Home Access: Stairs to enter;Ramped entrance Entrance Stairs-Number of Steps: 1 step onto porch   Home Layout: One level     Bathroom Shower/Tub: Teacher, early years/pre: Handicapped height     Home Equipment: Grab bars - tub/shower          Prior Functioning/Environment Level of Independence: Needs assistance  Gait / Transfers Assistance Needed: no use of AD for mobility, uses electric cart in grocery stores due to SOB with long distances ADL's / Homemaking Assistance Needed: Typically independent with ADLs, but may require assist with bras due to difficulty reaching behind back or LB dressing around R foot at times. Shares IADLs in the home, enjoys  cooking but sometimes limited by pain.            OT Problem List: Decreased activity tolerance;Pain      OT Treatment/Interventions:      OT Goals(Current goals can be found in the care plan section) Acute Rehab OT Goals Patient Stated Goal: go home when able OT Goal Formulation: All assessment and education complete, DC therapy  OT Frequency:     Barriers to D/C:            Co-evaluation              AM-PAC OT "6 Clicks" Daily Activity     Outcome Measure Help from another person eating meals?: None Help from another person taking care of personal grooming?: None Help from another person toileting, which includes using toliet, bedpan, or urinal?: None Help from another person bathing (including washing, rinsing, drying)?: A Little Help from another person to put on and taking off regular upper body clothing?: A Little Help from another person to put on and taking off regular lower body clothing?: A Little 6 Click Score: 21   End of Session Equipment Utilized During Treatment: Cervical collar Nurse Communication: Mobility status  Activity Tolerance: Patient tolerated treatment  well Patient left: in bed;with call bell/phone within reach;Other (comment) (with MD present)  OT Visit Diagnosis: Pain Pain - part of body:  (neck)                Time: 5749-3552 OT Time Calculation (min): 21 min Charges:  OT General Charges $OT Visit: 1 Visit OT Evaluation $OT Eval Low Complexity: 1 Low  Malachy Chamber, OTR/L Acute Rehab Services Office: [phone]  Layla Maw 10/10/2020, 8:14 AM

## 2020-10-10 NOTE — Telephone Encounter (Signed)
Last office visit 09/20/20 Last refill 10/20/19, 400 grams/2 refills

## 2020-10-11 ENCOUNTER — Ambulatory Visit (INDEPENDENT_AMBULATORY_CARE_PROVIDER_SITE_OTHER): Payer: Medicaid Other | Admitting: Family Medicine

## 2020-10-11 ENCOUNTER — Encounter (HOSPITAL_COMMUNITY): Payer: Self-pay | Admitting: Neurosurgery

## 2020-10-11 DIAGNOSIS — Z981 Arthrodesis status: Secondary | ICD-10-CM | POA: Diagnosis not present

## 2020-10-11 MED ORDER — HYDROCODONE-ACETAMINOPHEN 5-325 MG PO TABS: 1.0000 | ORAL_TABLET | ORAL | 0 refills | Status: DC | PRN

## 2020-10-11 NOTE — Progress Notes (Signed)
Virtual Visit via Telephone Note  I connected with Judith [name] on 10/11/20 at 2:54 PM by telephone and verified that I am speaking with the correct person using two identifiers. Judith [name] is currently located at home and nobody is currently with her during this visit. The provider, Gwenlyn Fudge, FNP is located in their office at time of visit.  I discussed the limitations, risks, security and privacy concerns of performing an evaluation and management service by telephone and the availability of in person appointments. I also discussed with the patient that there may be a patient responsible charge related to this service. The patient expressed understanding and agreed to proceed.  Subjective: PCP: Raliegh Ip, DO  Chief Complaint  Patient presents with  . Pain   Patient is in need of a prescription for hydrocodone that was prescribed by her neurosurgeon, Dr. [name].  She had cervical spine surgery on 10/09/2020.  She was unable to pick up her postop prescription as her insurance has her locked in with one prescribing provider for controlled substances, which is her PCP Dr. [name].  Her PCP is currently out of the office on vacation.   ROS: Per HPI  Current Outpatient Medications:  .  Accu-Chek FastClix Lancets MISC, Test blood sugars daily Dx E11.9, Disp: 102 each, Rfl: 3 .  albuterol (PROVENTIL) (2.5 MG/3ML) 0.083% nebulizer solution, Take 3 mLs (2.5 mg total) by nebulization every 6 (six) hours as needed for wheezing or shortness of breath. Fill upon patient request, Disp: 75 mL, Rfl: 12 .  albuterol (VENTOLIN HFA) 108 (90 Base) MCG/ACT inhaler, Inhale 2 puffs into the lungs every 6 (six) hours as needed for wheezing or shortness of breath. Fill upon patient request, Disp: 8 g, Rfl: 1 .  Blood Glucose Monitoring Suppl (ACCU-CHEK GUIDE ME) w/Device KIT, Use to check BS BID and as needed. DX: E11.9, Disp: 1 kit, Rfl: 0 .  budesonide-formoterol (SYMBICORT) 160-4.5 MCG/ACT  inhaler, Inhale 2 puffs into the lungs 2 (two) times daily., Disp: 1 each, Rfl: 3 .  Continuous Blood Gluc Receiver (FREESTYLE LIBRE READER) DEVI, 1 Units by Does not apply route daily. UAD to test BGs daily. Dx E11.40, Disp: 1 each, Rfl: 1 .  Continuous Blood Gluc Sensor (FREESTYLE LIBRE 14 DAY SENSOR) MISC, 1 Units by Does not apply route daily. UAD to check blood sugars R73.09, Disp: 2 each, Rfl: 12 .  cyclobenzaprine (FLEXERIL) 10 MG tablet, Take 1 tablet (10 mg total) by mouth 3 (three) times daily as needed for muscle spasms., Disp: 90 tablet, Rfl: 0 .  dexlansoprazole (DEXILANT) 60 MG capsule, TAKE 1 CAPSULE BY MOUTH EVERY DAY (Patient taking differently: Take 60 mg by mouth at bedtime.), Disp: 30 capsule, Rfl: 3 .  diclofenac Sodium (VOLTAREN) 1 % GEL, Apply 4 g topically 4 (four) times daily as needed (knee pain)., Disp: 400 g, Rfl: 0 .  dicyclomine (BENTYL) 20 MG tablet, TAKE 1 TABLET (20 MG TOTAL) BY MOUTH EVERY 6 (SIX) HOURS. (Patient taking differently: Take 20 mg by mouth every 6 (six) hours as needed for spasms (abdominal cramps).), Disp: 360 tablet, Rfl: 0 .  docusate sodium (COLACE) 100 MG capsule, Take 1 capsule (100 mg total) by mouth 2 (two) times daily., Disp: 60 capsule, Rfl: 0 .  doxycycline (VIBRAMYCIN) 100 MG capsule, Take one cap PO Q12hr with food., Disp: 14 capsule, Rfl: 0 .  gabapentin (NEURONTIN) 300 MG capsule, Take 1 capsule (300 mg total) by mouth at bedtime  for 3 days, THEN 1 capsule (300 mg total) 2 (two) times daily for 3 days, THEN 1 capsule (300 mg total) 3 (three) times daily. (Patient taking differently: 1 capsule (300 mg total) 3 (three) times daily.), Disp: 90 capsule, Rfl: 3 .  glucose blood (ACCU-CHEK GUIDE) test strip, Use as instructed E11.9, Disp: 100 strip, Rfl: 12 .  guaiFENesin-codeine 100-10 MG/5ML syrup, Take 10 mLs by mouth 3 (three) times daily as needed for cough. (Patient taking differently: Take 10 mLs by mouth at bedtime.), Disp: 50 mL, Rfl: 0 .   HYDROcodone-acetaminophen (NORCO/VICODIN) 5-325 MG tablet, Take 1 tablet by mouth every 4 (four) hours as needed for moderate pain ((score 4 to 6))., Disp: 30 tablet, Rfl: 0 .  hydrOXYzine (ATARAX/VISTARIL) 25 MG tablet, TAKE 1 TABLET BY MOUTH THREE TIMES A DAY (Patient taking differently: Take 25 mg by mouth every 8 (eight) hours as needed for itching or anxiety.), Disp: 270 tablet, Rfl: 1 .  insulin degludec (TRESIBA FLEXTOUCH) 200 UNIT/ML FlexTouch Pen, Inject 66 Units into the skin at bedtime. (Patient taking differently: Inject 66 Units into the skin at bedtime.), Disp: 15 mL, Rfl: 3 .  Insulin Pen Needle 31G X 5 MM MISC, 1 Units by Does not apply route daily., Disp: 100 each, Rfl: 5 .  lamoTRIgine (LAMICTAL) 150 MG tablet, Take 1 tablet (150 mg total) by mouth 2 (two) times daily., Disp: 60 tablet, Rfl: 2 .  levothyroxine (SYNTHROID) 125 MCG tablet, Take 1 tablet (125 mcg total) by mouth daily. (Patient taking differently: Take 125 mcg by mouth at bedtime.), Disp: 90 tablet, Rfl: 1 .  linaclotide (LINZESS) 72 MCG capsule, Take 1 capsule (72 mcg total) by mouth daily before breakfast. (Patient taking differently: Take 72 mcg by mouth daily as needed (constipation).), Disp: 30 capsule, Rfl: 2 .  lisdexamfetamine (VYVANSE) 70 MG capsule, Take 1 capsule (70 mg total) by mouth daily. (Patient taking differently: Take 70 mg by mouth in the morning.), Disp: 30 capsule, Rfl: 0 .  [START ON 10/26/2020] lisdexamfetamine (VYVANSE) 70 MG capsule, Take 1 capsule (70 mg total) by mouth daily. (Patient not taking: Reported on 10/04/2020), Disp: 30 capsule, Rfl: 0 .  lisdexamfetamine (VYVANSE) 70 MG capsule, Take 1 capsule (70 mg total) by mouth daily. (Patient not taking: Reported on 10/04/2020), Disp: 30 capsule, Rfl: 0 .  medroxyPROGESTERone (DEPO-PROVERA) 150 MG/ML injection, Inject 1 mL (150 mg total) into the muscle every 3 (three) months., Disp: 1 mL, Rfl: 3 .  montelukast (SINGULAIR) 10 MG tablet, TAKE ONE TAB  BY MOUTH AT BEDTIME (Patient taking differently: Take 10 mg by mouth at bedtime.), Disp: 90 tablet, Rfl: 3 .  nystatin cream (MYCOSTATIN), Apply 1 application topically 2 (two) times daily. x2 weeks (Patient taking differently: Apply 1 application topically 2 (two) times daily as needed (rash).), Disp: 60 g, Rfl: 0 .  ondansetron (ZOFRAN) 4 MG tablet, Take 1 tablet (4 mg total) by mouth every 8 (eight) hours as needed for nausea or vomiting., Disp: 20 tablet, Rfl: 0 .  pioglitazone (ACTOS) 30 MG tablet, Take 1 tablet (30 mg total) by mouth daily. (Patient taking differently: Take 30 mg by mouth at bedtime.), Disp: 90 tablet, Rfl: 3 .  promethazine (PHENERGAN) 25 MG tablet, Take 1 tablet (25 mg total) by mouth every 6 (six) hours as needed for nausea or vomiting., Disp: 30 tablet, Rfl: 0 .  rosuvastatin (CRESTOR) 40 MG tablet, Take 1 tablet (40 mg total) by mouth daily. (Patient taking differently: Take  40 mg by mouth at bedtime.), Disp: 90 tablet, Rfl: 3 .  Semaglutide (RYBELSUS) 14 MG TABS, Take 1 tablet by mouth daily before breakfast. (Patient taking differently: Take 14 mg by mouth daily before breakfast.), Disp: 30 tablet, Rfl: prn .  sertraline (ZOLOFT) 100 MG tablet, Take 2 tablets (200 mg total) by mouth daily. (Patient taking differently: Take 200 mg by mouth at bedtime.), Disp: 180 tablet, Rfl: 3  Current Facility-Administered Medications:  .  medroxyPROGESTERone (DEPO-PROVERA) injection 150 mg, 150 mg, Intramuscular, Q90 days, Ronnie Doss M, DO, 150 mg at 09/10/20 0841  Allergies  Allergen Reactions  . Diphenhydramine Other (See Comments)    Hair feels like it is crawling  . Morphine Nausea And Vomiting    PCA PUMP/ DRIP -- N/V IV PUSH IN ER NO PROBLEM PER PT.  . Prednisone Nausea And Vomiting  . Adhesive [Tape]     Paper tape causes irritation   . Erythromycin Hives  . Metformin And Related     Diarrhea with IR and XR products  . Sulfonamide Derivatives Other (See  Comments)    Does not take due to family history   Past Medical History:  Diagnosis Date  . Allergy   . Anxiety   . Arthritis   . Asthma   . Bipolar 1 disorder ([place])   . Blood transfusion without reported diagnosis    with child birth 63 yrs ago   . Bronchitis, chronic ([place])   . Constipation   . COVID-19 virus infection 06/21/2020  . DDD (degenerative disc disease), cervical   . Depression   . Diabetes mellitus without complication ([place])   . Dysphagia   . Family history of adverse reaction to anesthesia    mother had a hard time waking up after knee surgery in 2021  . Gastroparesis   . GERD (gastroesophageal reflux disease)   . History of kidney stones   . History of MRSA infection   . Hyperlipemia   . Hypothyroid   . Migraines   . Pneumonia   . Sleep apnea    no cpap     Observations/Objective: A&O  No respiratory distress or wheezing audible over the phone Mood, judgement, and thought processes all WNL   Assessment and Plan: 1. Status post cervical spinal fusion I resent the prescription her neurosurgeon had written.  I am hopeful since I work with Dr. Lajuana Ripple that her insurance will accept my prescription since she is on vacation.  Discussed with patient that she should go ahead and schedule a follow-up appointment with Dr. Lajuana Ripple for further management so that she does not end up in a pickle again. - HYDROcodone-acetaminophen (NORCO/VICODIN) 5-325 MG tablet; Take 1 tablet by mouth every 4 (four) hours as needed for moderate pain ((score 4 to 6)).  Dispense: 30 tablet; Refill: 0    Follow Up Instructions:  I discussed the assessment and treatment plan with the patient. The patient was provided an opportunity to ask questions and all were answered. The patient agreed with the plan and demonstrated an understanding of the instructions.   The patient was advised to call back or seek an in-person evaluation if the symptoms worsen or if the condition fails to  improve as anticipated.  The above assessment and management plan was discussed with the patient. The patient verbalized understanding of and has agreed to the management plan. Patient is aware to call the clinic if symptoms persist or worsen. Patient is aware when to return to the clinic  for a follow-up visit. Patient educated on when it is appropriate to go to the emergency department.   Time call ended: 3:02 PM  I provided 8 minutes of non-face-to-face time during this encounter.  Hendricks Limes, MSN, APRN, FNP-C [facility] 10/11/20

## 2020-10-16 DIAGNOSIS — J45901 Unspecified asthma with (acute) exacerbation: Secondary | ICD-10-CM | POA: Diagnosis not present

## 2020-10-16 DIAGNOSIS — R059 Cough, unspecified: Secondary | ICD-10-CM | POA: Diagnosis not present

## 2020-10-16 DIAGNOSIS — R058 Other specified cough: Secondary | ICD-10-CM | POA: Diagnosis not present

## 2020-10-16 NOTE — Telephone Encounter (Signed)
Called and spoke to [place] at [facility], request remains DENIED because:       "Beneficiary must have a prebronchodilator FEV1 below 80% in adults and 90% in adolescents. Nucala must be used as add on maintenance treatment, must not be used for the treatment of other eosinophilic conditions, must not be used for the relief of acute bronchospasm or status asthmaticus, and must not be used as dual therapy with other monoclonal antibody treatments.       Coverage consideration is provided for beneficiaries 81 years of age and older, with a diagnosis of severe eosinophilic asthma, and with a pre-treatment serum eosinophil count of 150 cells/mcL or greater at screening (within the past 6 weeks prior to the request for Nucala) or 300 cells/mcL or greater within 12 months prior to use, or sputum eosinophilic count greater than 3%.       The beneficiary must have inadequate control of asthmatic symptoms after a minimum of 3 months of high dose corticosteroid inhaler in combination with a long acting beta-agonist and inadequately controlled severe asthma meeting one of the following definitions: 2 or more asthma exacerbations requiring oral/systemic corticosteroid treatment or hospitalization in the past 12 months."  Call Ref# [id number] Phone# [phone]

## 2020-10-23 ENCOUNTER — Other Ambulatory Visit: Payer: Self-pay | Admitting: Family Medicine

## 2020-10-23 NOTE — Telephone Encounter (Signed)
Submitted new PA request for NUCALA via [facility]. Will update when we receive a determination.  Confirmation# [id number]

## 2020-10-23 NOTE — Telephone Encounter (Signed)
Most recent CBC on 10/16/20 showed abs eosinophil of 300. Will re-submit authorization for Archer Asa, PharmD, MPH Clinical Pharmacist (Rheumatology and Pulmonology)

## 2020-10-24 ENCOUNTER — Other Ambulatory Visit (HOSPITAL_COMMUNITY): Payer: Self-pay

## 2020-10-24 MED ORDER — NUCALA 100 MG/ML ~~LOC~~ SOAJ
100.0000 mg | SUBCUTANEOUS | 0 refills | Status: DC
  Filled 2020-10-24: qty 1, 28d supply, fill #0

## 2020-10-24 MED ORDER — EPINEPHRINE 0.3 MG/0.3ML IJ SOAJ: 0.3000 mg | Freq: Once | INTRAMUSCULAR | 2 refills | Status: DC

## 2020-10-24 NOTE — Telephone Encounter (Signed)
Received notification from [facility] regarding a prior authorization for Judith [name]. Authorization has been APPROVED for a total of six (6) pens from 10/24/2020 to 04/21/2021.   Authorization # [id number]  Test claim revealed that insurance covers $3,326.32, leaving patient with a copay of $3.00.

## 2020-10-24 NOTE — Addendum Note (Signed)
Addended by: Cassandria Anger on: 10/24/2020 12:56 PM   Modules accepted: Orders

## 2020-10-24 NOTE — Telephone Encounter (Signed)
Spoke with patient regarding Nucala approval. Discussed Epipen requirement and monitoring period. She is scheduled for Nucala new start on 10/29/20.  Rx for Epipen sent to pt-preferred [facility] - she will stop by today to pickup.  Rx for Nucala sent to [facility] to be couriered to clinic. Patient advised of copay of $3 that will be collected - she requested call at 2pm on 10/24/20 to collect payment information. Routing to [employer] to assist.  Knox Saliva, PharmD, MPH Clinical Pharmacist (Rheumatology and Pulmonology)

## 2020-10-24 NOTE — Telephone Encounter (Signed)
Payment information has been collected and provided to the pharmacy team

## 2020-10-24 NOTE — Addendum Note (Signed)
Addended by: Cassandria Anger on: 10/24/2020 03:21 PM   Modules accepted: Orders

## 2020-10-25 ENCOUNTER — Other Ambulatory Visit (HOSPITAL_COMMUNITY): Payer: Self-pay

## 2020-10-25 ENCOUNTER — Telehealth: Payer: Self-pay | Admitting: Family Medicine

## 2020-10-25 NOTE — Telephone Encounter (Signed)
Spoke with patient, appointment scheduled 11/06/20 at 2:15 pm with Dr Lajuana Ripple

## 2020-10-25 NOTE — Progress Notes (Signed)
HPI Patient presents today to [facility] to see pharmacy team for [facility] new start.  Past medical history includes moderate persistent asthma, GERD, hypothyroidism, ADD, hyperlipidemia.   She visited ED on5/17/22 and 10/16/20 for asthma exacerbation. She has received several Medrol injections for these exacerbation which generally provide relief.  Patient does have symptoms like cough that improves in certain environments like [place]. She is eager to get started since she has not had relief. States she sometimes feels like she is going to black out from how bad her cough is. States this cough is worse at night.  Epipen on hand and in date: Yes. No history of anaphylaxis.  Respiratory Medications Current regimen: Symbicort 160/4.6mg (2 puffs twice daily), montelukast 114mnightly,  Tried in past: Dulera,  Patient reports no known adherence challenges  OBJECTIVE Allergies  Allergen Reactions   Diphenhydramine Other (See Comments)    Hair feels like it is crawling   Morphine Nausea And Vomiting    PCA PUMP/ DRIP -- N/V IV PUSH IN ER NO PROBLEM PER PT.     Prednisone Nausea And Vomiting   Adhesive [Tape]     Paper tape causes irritation    Erythromycin Hives   Metformin And Related     Diarrhea with IR and XR products   Sulfonamide Derivatives Other (See Comments)    Does not take due to family history    Outpatient Encounter Medications as of 10/29/2020  Medication Sig Note   cyclobenzaprine (FLEXERIL) 10 MG tablet Take 1 tablet (10 mg total) by mouth 3 (three) times daily as needed for muscle spasms.    dexlansoprazole (DEXILANT) 60 MG capsule TAKE 1 CAPSULE BY MOUTH EVERY DAY (Patient taking differently: Take 60 mg by mouth at bedtime.)    diclofenac Sodium (VOLTAREN) 1 % GEL Apply 4 g topically 4 (four) times daily as needed (knee pain).    dicyclomine (BENTYL) 20 MG tablet TAKE 1 TABLET (20 MG TOTAL) BY MOUTH EVERY 6 (SIX) HOURS. (Patient taking differently: Take 20 mg  by mouth every 6 (six) hours as needed for spasms (abdominal cramps).)    gabapentin (NEURONTIN) 300 MG capsule Take 1 capsule (300 mg total) by mouth at bedtime for 3 days, THEN 1 capsule (300 mg total) 2 (two) times daily for 3 days, THEN 1 capsule (300 mg total) 3 (three) times daily. (Patient taking differently: 1 capsule (300 mg total) 3 (three) times daily.)    glucose blood (ACCU-CHEK GUIDE) test strip Use as instructed E11.9    hydrOXYzine (ATARAX/VISTARIL) 25 MG tablet TAKE 1 TABLET BY MOUTH THREE TIMES A DAY (Patient taking differently: Take 25 mg by mouth every 8 (eight) hours as needed for itching or anxiety.)    ibuprofen (ADVIL) 600 MG tablet Take 600 mg by mouth at bedtime as needed.    insulin degludec (TRESIBA FLEXTOUCH) 200 UNIT/ML FlexTouch Pen Inject 66 Units into the skin at bedtime.    Insulin Pen Needle 31G X 5 MM MISC 1 Units by Does not apply route daily.    levothyroxine (SYNTHROID) 125 MCG tablet Take 1 tablet (125 mcg total) by mouth daily. (Patient taking differently: Take 125 mcg by mouth at bedtime.)    linaclotide (LINZESS) 72 MCG capsule Take 1 capsule (72 mcg total) by mouth daily before breakfast. (Patient taking differently: Take 72 mcg by mouth daily as needed (constipation).)    lisdexamfetamine (VYVANSE) 70 MG capsule Take 1 capsule (70 mg total) by mouth daily. (Patient taking differently: Take  70 mg by mouth in the morning.)    medroxyPROGESTERone (DEPO-PROVERA) 150 MG/ML injection Inject 1 mL (150 mg total) into the muscle every 3 (three) months. 10/04/2020: Next dose due:07/22   pioglitazone (ACTOS) 30 MG tablet TAKE 1 TABLET BY MOUTH EVERY DAY    Accu-Chek FastClix Lancets MISC Test blood sugars daily Dx E11.9    albuterol (PROVENTIL) (2.5 MG/3ML) 0.083% nebulizer solution Take 3 mLs (2.5 mg total) by nebulization every 6 (six) hours as needed for wheezing or shortness of breath. Fill upon patient request    albuterol (VENTOLIN HFA) 108 (90 Base) MCG/ACT  inhaler Inhale 2 puffs into the lungs every 6 (six) hours as needed for wheezing or shortness of breath. Fill upon patient request    Blood Glucose Monitoring Suppl (ACCU-CHEK GUIDE ME) w/Device KIT Use to check BS BID and as needed. DX: E11.9    budesonide-formoterol (SYMBICORT) 160-4.5 MCG/ACT inhaler Inhale 2 puffs into the lungs 2 (two) times daily.    Continuous Blood Gluc Receiver (FREESTYLE LIBRE READER) DEVI 1 Units by Does not apply route daily. UAD to test BGs daily. Dx E11.40    Continuous Blood Gluc Sensor (FREESTYLE LIBRE 14 DAY SENSOR) MISC 1 Units by Does not apply route daily. UAD to check blood sugars R73.09    docusate sodium (COLACE) 100 MG capsule Take 1 capsule (100 mg total) by mouth 2 (two) times daily.    lamoTRIgine (LAMICTAL) 150 MG tablet Take 1 tablet (150 mg total) by mouth 2 (two) times daily.    lisdexamfetamine (VYVANSE) 70 MG capsule Take 1 capsule (70 mg total) by mouth daily. (Patient not taking: Reported on 10/04/2020)    lisdexamfetamine (VYVANSE) 70 MG capsule Take 1 capsule (70 mg total) by mouth daily. (Patient not taking: Reported on 10/04/2020)    Mepolizumab (NUCALA) 100 MG/ML SOAJ Inject 1 mL (100 mg total) into the skin every 28 (twenty-eight) days. Deliver to clinic: [address], [place], [place] [address]    montelukast (SINGULAIR) 10 MG tablet TAKE ONE TAB BY MOUTH AT BEDTIME (Patient taking differently: Take 10 mg by mouth at bedtime.)    nystatin cream (MYCOSTATIN) Apply 1 application topically 2 (two) times daily. x2 weeks (Patient taking differently: Apply 1 application topically 2 (two) times daily as needed (rash).)    ondansetron (ZOFRAN) 4 MG tablet Take 1 tablet (4 mg total) by mouth every 8 (eight) hours as needed for nausea or vomiting.    promethazine (PHENERGAN) 25 MG tablet Take 1 tablet (25 mg total) by mouth every 6 (six) hours as needed for nausea or vomiting.    rosuvastatin (CRESTOR) 40 MG tablet Take 1 tablet (40 mg total) by  mouth daily. (Patient taking differently: Take 40 mg by mouth at bedtime.)    Semaglutide (RYBELSUS) 14 MG TABS Take 1 tablet by mouth daily before breakfast. (Patient taking differently: Take 14 mg by mouth daily before breakfast.)    sertraline (ZOLOFT) 100 MG tablet Take 2 tablets (200 mg total) by mouth daily. (Patient taking differently: Take 200 mg by mouth at bedtime.)    [DISCONTINUED] doxycycline (VIBRAMYCIN) 100 MG capsule Take one cap PO Q12hr with food. 10/04/2020: 7 day therapy course patient to complete on 10/08/20   [DISCONTINUED] guaiFENesin-codeine 100-10 MG/5ML syrup Take 10 mLs by mouth 3 (three) times daily as needed for cough. (Patient taking differently: Take 10 mLs by mouth at bedtime.)    [DISCONTINUED] HYDROcodone-acetaminophen (NORCO/VICODIN) 5-325 MG tablet Take 1 tablet by mouth every  4 (four) hours as needed for moderate pain ((score 4 to 6)).    Facility-Administered Encounter Medications as of 10/29/2020  Medication   medroxyPROGESTERone (DEPO-PROVERA) injection 150 mg     Immunization History  Administered Date(s) Administered   Influenza, Seasonal, Injecte, Preservative Fre 02/09/2018   Influenza,inj,Quad PF,6+ Mos 02/14/2017, 02/20/2019   Influenza-Unspecified 02/05/2020   MMR 02/03/1972   Moderna SARS-COV2 Booster Vaccination 06/05/2020   Moderna Sars-Covid-2 Vaccination 10/02/2019, 11/01/2019, 04/18/2020   Pneumococcal Polysaccharide-23 02/20/2019, 11/01/2019   Td 03/03/1985, 06/30/1994   Tdap 12/24/2014, 08/15/2019   Zoster Recombinat (Shingrix) 02/05/2020     PFTs PFT Results Latest Ref Rng & Units 09/26/2020  FVC-Pre L 2.75  FVC-Predicted Pre % 67  FVC-Post L 2.60  FVC-Predicted Post % 64  Pre FEV1/FVC % % 87  Post FEV1/FCV % % 89  FEV1-Pre L 2.40  FEV1-Predicted Pre % 75  FEV1-Post L 2.33  DLCO uncorrected ml/min/mmHg 21.53  DLCO UNC% % 89  DLCO corrected ml/min/mmHg 21.53  DLCO COR %Predicted % 89  DLVA Predicted % 127  TLC L 3.84  TLC  % Predicted % 67  RV % Predicted % 59     Eosinophils Most recent blood eosinophil count was 300 cells/microL taken on 10/16/20.   IgE: 44 on 04/04/20   Assessment   Biologics training for mepolizumab (Nucala)  Goals of therapy: Mechanism of Action: Not fully understood. It does act an interleukin-5 (IL-5) antagonist monoclonal antibody that reduces the production and survival of eosinophils by blocking the binding of IL-5 to the alpha chain of the receptor complex on the eosinophil cell surface. Reviewed that Nucala is add-on medication and patient must continue maintenance inhaler regimen. Response to therapy: may take 3 months to 6 months to determine efficacy. Discussed that patients generally feel improvement sooner than 3 months.  We discussed today that we are unsure if her cough and other symptoms are due specifically to her asthma since they are relieved in specific environments. However, given that she is on optimized treatment for her asthma and continues to receive steroid injections for exacerbations, a trial of Nucala is warranted. Discussed that this trial may not provide improvement for her though we are hoping otherwise.  Side effects: headache (19%), injection site reaction (7-15%), antibody development (6%), backache (5%), fatigue (5%)  Dose: 100 mg subcutaneously every 4 weeks  Administration/Storage:  Reviewed administration sites of thigh or abdomen (at least 2-3 inches away from abdomen). Reviewed the upper arm is only appropriate if caregiver is administering injection  Do not shake the reconstituted solution as this could lead to product foaming or precipitation. Solution should be clear to opalescent and colorless to pale yellow or pale brown, essentially particle free. Small air bubbles, however, are expected and acceptable. If particulate matter remains in the solution or if the solution appears cloudy or milky, discard the solution.  Reviewed storage of  medication in refrigerator. Reviewed that Nucala can be stored at room temperature in unopened carton for up to 7 days.  Access: Approval of Nucala through: insurance [facility])  She was able to self-administer in right upper thigh using medication delivered by pharmacy: [facility]: [phone] Lot: [id number] Exp:: 01/2023  Medication Reconciliation  A drug regimen assessment was performed, including review of allergies, interactions, disease-state management, dosing and immunization history. Medications were reviewed with the patient, including name, instructions, indication, goals of therapy, potential side effects, importance of adherence, and safe use.  Drug interaction(s): none noted  Immunizations  Patient is indicated  for the influenzae, pneumonia, and shingles vaccinations. Patient has received 3 [place] vaccines. Removed 4th dose that was documented in chart by error that stated she received in January 2022. UTD on influenza and pneumonia vaccinations Has received one live zoster vaccination   PLAN Continue Nucala 151m every 4 weeks. Next dose is due 11/26/20 and every 4 weeks thereafter. [phone]. Patient provided with pharmacy phone number and advised to call later this week to schedule shipment to home. Patient provided with copay card information to provide to pharmacy if quoted copay exceeds $5 per month. Continue maintenance asthma regimen of: Symbicort 160/4.567m (2 puffs twice daily) and montelukast 1057mightly  All questions encouraged and answered.  Instructed patient to reach out with any further questions or concerns.  Thank you for allowing pharmacy to participate in this patient's care.  This appointment required 60 minutes of patient care (this includes precharting, chart review, review of results, face-to-face care, etc.).  DevKnox SalivaharmD, MPH Clinical Pharmacist (Rheumatology and  Pulmonology)

## 2020-10-29 ENCOUNTER — Ambulatory Visit: Payer: Medicaid Other | Admitting: Pharmacist

## 2020-10-29 ENCOUNTER — Other Ambulatory Visit: Payer: Self-pay

## 2020-10-29 ENCOUNTER — Other Ambulatory Visit (HOSPITAL_COMMUNITY): Payer: Self-pay

## 2020-10-29 DIAGNOSIS — J4540 Moderate persistent asthma, uncomplicated: Secondary | ICD-10-CM

## 2020-10-29 MED ORDER — NUCALA 100 MG/ML ~~LOC~~ SOAJ
100.0000 mg | SUBCUTANEOUS | 1 refills | Status: DC
  Filled 2020-10-29 – 2020-11-21 (×2): qty 3, 84d supply, fill #0
  Filled 2021-02-14: qty 1, 28d supply, fill #1
  Filled 2021-03-18: qty 1, 28d supply, fill #2
  Filled 2021-04-14 – 2021-04-16 (×2): qty 1, 28d supply, fill #3

## 2020-10-29 NOTE — Patient Instructions (Signed)
Your next Nucala dose is due on 11/26/20, 12/24/20, and every 4 weeks thereafter  Your prescription will be shipped from [facility]. Their phone number is [phone]  They will mail medication to your home. Address is: [address] [place], [place] [address] (8am to 6pm)  Remember the 5 C's: COUNTER - leave on the counter at least 30 minutes but up to overnight to bring medication to room temperature. This may help prevent stinging COLD - place something cold (like an ice gel pack or cold water bottle) on the injection site just before cleansing with alcohol. This may help reduce pain CLARITIN - use Claritin (generic name is loratadine) for the first two weeks of treatment or the day of, the day before, and the day after injecting. This will help to minimize injection site reactions CORTISONE CREAM - apply if injection site is irritated and itching CALL ME - if injection site reaction is bigger than the size of your fist, looks infected, blisters, or if you develop hives   Anaphylactic Reactions  Anaphylactic reactions can occur up to 24 hours after administration.  What are the signs and symptoms of anaphylaxis?  Symptoms can vary from a mild skin reaction to more severe reactions including: Wheezing, shortness of breath, cough, chest tightness or trouble breathing Low blood pressure, dizziness, fainting, rapid of weak heartbeat, anxiety, or feeling of "impending doom" Flushing, itching, hives, or feeling warm Swelling of the throat or tongue, throat tightness, hoarse voice, or trouble swallowing  Mepolizumab injection What is this medication? MEPOLIZUMAB (me poe LIZ ue mab) is a monoclonal antibody. It treats severe asthma. It is used with other drugs for asthma. It also treats other conditions that have a high level of eosinophils (a type of white blood cell) called hypereosinophilic syndrome (HES) or Churg-Strauss Syndrome. This medicine isalso used to  treat adults with sinus inflammation with nasal polyps. This medicine may be used for other purposes; ask your health care provider orpharmacist if you have questions. COMMON BRAND NAME(S): Nucala What should I tell my care team before I take this medication? They need to know if you have any of these conditions: parasitic (helminth) infection an unusual or allergic reaction to mepolizumab, hamster proteins, other medicines, foods, dyes, or preservatives pregnant or trying to get pregnant breast-feeding How should I use this medication? This drug is injected under the skin. It is usually given by a health careprovider in a hospital or clinic setting. If you get this drug at home, you will be taught how to prepare and give it. Use it as directed on the prescription label. Keep taking it unless your healthcare provider tells you to stop. If you use a pen, be sure to take off the outer needle cover before using the dose. It is important that you put your used needles and syringes in a special sharps container. Do not put them in a trash can. If you do not have a sharpscontainer, call your pharmacist or healthcare provider to get one. This drug comes with INSTRUCTIONS FOR USE. Ask your pharmacist for directions on how to use this drug. Read the information carefully. Talk to yourpharmacist or health care provider if you have questions. Talk to your health care provider about the use of this drug in children. While it may be prescribed for children as young as 6 years for selected conditions,precautions do apply. Overdosage: If you think you have taken too much of this medicine contact apoison control center  or emergency room at once. NOTE: This medicine is only for you. Do not share this medicine with others. What if I miss a dose? If you get this drug at the hospital or clinic: It is important not to miss your dose. Call your doctor or health care provider if you are unable to [facility] appointment. If  you give yourself this drug at home: If you miss a dose, take it as soon as you can. Then continue your normal schedule. If it is almost time for your next dose, take only that dose. Do not take double or extra doses. Call your healthcare provider with questions. What may interact with this medication? Interactions are not expected. This list may not describe all possible interactions. Give your health care provider a list of all the medicines, herbs, non-prescription drugs, or dietary supplements you use. Also tell them if you smoke, drink alcohol, or use illegaldrugs. Some items may interact with your medicine. What should I watch for while using this medication? Tell your doctor or healthcare professional if your symptoms do not start toget better or if they get worse. Talk with your doctor if you have not had chickenpox or the vaccine forchickenpox. Do not stop taking your other asthma medicines unless instructed to do so byyour doctor or health care professional. Your condition will be monitored carefully while you are receiving thismedicine. What side effects may I notice from receiving this medication? Side effects that you should report to your doctor or health care professionalas soon as possible: allergic reactions like skin rash, itching or hives, swelling of the face, lips, or tongue breathing problems painful rash or blisters signs and symptoms of low blood pressure like dizziness; feeling faint or lightheaded, falls signs and symptoms of infection like fever or chills; cough; sore throat; pain or trouble passing urine Side effects that usually do not require medical attention (report to yourdoctor or health care professional if they continue or are bothersome): back pain headache pain, redness, or irritation at the site where injected weak or tired This list may not describe all possible side effects. Call your doctor for medical advice about side effects. You may report side  effects to FDA at1-800-FDA-1088. Where should I keep my medication? Keep out of the reach of children and pets. Store in a refrigerator or at room temperature between 15 and 30 degrees C (59and 86 degrees F). Refrigeration (preferred): Store it in the refrigerator. Keep it in the original carton until you are ready to take it. Remove the dose from the carton about 30 minutes before it is time for you to take it. Use it within 8 hours of removing it from the carton. If the dose is out of the carton for more than 8hours, throw it away. Throw away any unused drug after the expiration date. Room Temperature: This drug may be stored at room temperature for up to 7 days. Keep it in the original carton until you are ready to take it. Once removed from the carton, it must be used within 8 hours. If it is out of the carton for more than 8 hours, throw it away. If it is stored at room temperature, throwaway any unused drug after 7 days or after it expires, whichever is first. NOTE: This sheet is a summary. It may not cover all possible information. If you have questions about this medicine, talk to your doctor, pharmacist, orhealth care provider.  2022 Elsevier/Gold Standard (2019-12-15 16:38:25)  Some of these symptoms require  immediate treatment, as they can be life threatening.  If you experience severe symptoms which are bolded above use your Epipen and call 911 for immediate emergency care.

## 2020-11-01 ENCOUNTER — Ambulatory Visit (INDEPENDENT_AMBULATORY_CARE_PROVIDER_SITE_OTHER): Payer: Medicaid Other | Admitting: Pharmacist

## 2020-11-01 ENCOUNTER — Encounter: Payer: Self-pay | Admitting: Pharmacist

## 2020-11-01 ENCOUNTER — Other Ambulatory Visit: Payer: Self-pay

## 2020-11-01 VITALS — Wt 300.0 lb

## 2020-11-01 DIAGNOSIS — H2513 Age-related nuclear cataract, bilateral: Secondary | ICD-10-CM | POA: Diagnosis not present

## 2020-11-01 DIAGNOSIS — H40033 Anatomical narrow angle, bilateral: Secondary | ICD-10-CM | POA: Diagnosis not present

## 2020-11-01 DIAGNOSIS — E1165 Type 2 diabetes mellitus with hyperglycemia: Secondary | ICD-10-CM

## 2020-11-01 MED ORDER — PIOGLITAZONE HCL 45 MG PO TABS: 45.0000 mg | ORAL_TABLET | Freq: Every day | ORAL | 1 refills | Status: DC

## 2020-11-01 NOTE — Progress Notes (Signed)
    11/01/2020 Name: Judith [name] MRN: [id number] DOB: 1970/12/31     S:  11 yoF Presents for diabetes evaluation, education, and management Patient was referred and last seen by Primary Care Provider on 04/25/20.   Insurance coverage/medication affordability: medicaid   Patient reports adherence with medications. Current diabetes medications include: tresiba, actos, rybelsus 14mg  Current hypertension medications include: lisinopril Goal 130/80 Current hyperlipidemia medications include: rosuvastatin   Patient denies hypoglycemic events.   Discussed meal planning options and Plate method for healthy eating Avoid sugary drinks and desserts Incorporate balanced protein, non starchy veggies, 1 serving of carbohydrate with each meal Increase water intake Increase physical activity as able  Patient-reported exercise habits: n/a   O:  Lab Results  Component Value Date   HGBA1C 8.0 (H) 10/07/2020    Lipid Panel     Component Value Date/Time   CHOL 204 (H) 09/10/2020 0836   TRIG 250 (H) 09/10/2020 0836   HDL 29 (L) 09/10/2020 0836   CHOLHDL 7.0 (H) 09/10/2020 0836   LDLCALC 130 (H) 09/10/2020 0836     Home fasting blood sugars: 150-180  2 hour post-meal/random blood sugars: 200S.    Clinical Atherosclerotic Cardiovascular Disease (ASCVD): No   The 10-year ASCVD risk score Mikey Bussing DC Jr., et al., 2013) is: 6.2%   Values used to calculate the score:     Age: 50 years     Sex: Female     Is Non-Hispanic African American: No     Diabetic: Yes     Tobacco smoker: No     Systolic Blood Pressure: 378 mmHg     Is BP treated: No     HDL Cholesterol: 29 mg/dL     Total Cholesterol: 204 mg/dL    A/P:  Diabetes t2dM currently UNCONTROLLED. Patient is able to verbalize appropriate hypoglycemia management plan. Patient is adherent with medication. Control is suboptimal due to recent rounds of steroid injections.  -PATIENT HAS HAD 6 STEROID SHOTS DUE TO NECK  PAIN/INFLAMMATION -INCREASE INSULIN TO tresiba 70-80 UNITS DAILY -INCREASING ACTOS BACK TO 45mg  daily  -RECOMMENDED DECREASE SODAS (patient currently having 2 per day)  CONTINUE RYBELSUS 14MG  DAILY  REVIEW APPROPRIATE ADMINISTRATION  Denies personal and family history of Medullary thyroid cancer (MTC)  Continue all other medications as prescribed  -Extensively discussed pathophysiology of diabetes, recommended lifestyle interventions, dietary effects on blood sugar control  -Counseled on s/sx of and management of hypoglycemia  -Next A1C anticipated 12/2020.     Written patient instructions provided.  Total time in face to face counseling 25 minutes.   Regina Eck, PharmD, BCPS Clinical Pharmacist, [place]  II Phone [phone]

## 2020-11-06 ENCOUNTER — Other Ambulatory Visit: Payer: Self-pay

## 2020-11-06 ENCOUNTER — Ambulatory Visit: Payer: Medicaid Other | Admitting: Family Medicine

## 2020-11-06 ENCOUNTER — Encounter: Payer: Self-pay | Admitting: Family Medicine

## 2020-11-06 VITALS — BP 129/83 | HR 115 | Temp 98.1°F | Ht 68.0 in | Wt 300.6 lb

## 2020-11-06 DIAGNOSIS — Z981 Arthrodesis status: Secondary | ICD-10-CM

## 2020-11-06 DIAGNOSIS — F988 Other specified behavioral and emotional disorders with onset usually occurring in childhood and adolescence: Secondary | ICD-10-CM | POA: Diagnosis not present

## 2020-11-06 DIAGNOSIS — F39 Unspecified mood [affective] disorder: Secondary | ICD-10-CM | POA: Diagnosis not present

## 2020-11-06 DIAGNOSIS — Z09 Encounter for follow-up examination after completed treatment for conditions other than malignant neoplasm: Secondary | ICD-10-CM | POA: Diagnosis not present

## 2020-11-06 DIAGNOSIS — M5030 Other cervical disc degeneration, unspecified cervical region: Secondary | ICD-10-CM

## 2020-11-06 DIAGNOSIS — M5136 Other intervertebral disc degeneration, lumbar region: Secondary | ICD-10-CM | POA: Diagnosis not present

## 2020-11-06 MED ORDER — LISDEXAMFETAMINE DIMESYLATE 70 MG PO CAPS: 70.0000 mg | ORAL_CAPSULE | Freq: Every day | ORAL | 0 refills | Status: DC

## 2020-11-06 MED ORDER — LAMOTRIGINE 150 MG PO TABS: 150.0000 mg | ORAL_TABLET | Freq: Two times a day (BID) | ORAL | 2 refills | Status: DC

## 2020-11-06 MED ORDER — BACLOFEN 10 MG PO TABS: 5.0000 mg | ORAL_TABLET | Freq: Three times a day (TID) | ORAL | 1 refills | Status: DC | PRN

## 2020-11-06 MED ORDER — GABAPENTIN 300 MG PO CAPS: ORAL_CAPSULE | ORAL | 3 refills | Status: DC

## 2020-11-06 NOTE — Progress Notes (Signed)
Subjective: JD:NSJHOFLN discharge PCP: Raliegh Ip, DO HPI:Judith [name] is a 50 y.o. female presenting to clinic today for:  1.  Hospital discharge follow-up for cervical spine surgery/asthma exacerbation Patient had a C-spine fusion surgery done about 3-1/2 weeks ago.  She has healed fairly well and in fact notes that a lot of the radicular symptoms she was experiencing before have resolved.  She continues to have some occasional spasm in the neck and continues to struggle with degenerative changes and pain in her low back.  After she heals totally from her C-spine surgery her neurosurgeon plans to proceed with lumbar interventions.  She is considering going back to work with restrictions in the fall.  She would like to switch over from Flexeril to baclofen.  She finds Flexeril to be pretty sedating and would like to try something that might not be as sedating.  She feels that the gabapentin 300 mg 3 times daily is working well to control radicular symptoms.  Does not have any excessive daytime sedation from this medicine.  She does admit that she has had several corticosteroid injections and worries that her blood sugar may have gone up.  She certainly has struggled with weight loss but does note improved appetite with the Rybelsus.  No reports of GI symptoms with Rybelsus  2.  Asthma Patient recently placed on Nucala and she notes that this has improved her cough and overall breathing.  She continues to have some cough but is not nearly as severe as it had been.  She will be doing this as a once monthly treatment for her respiratory disease.  Denies any wheezing.  Compliant with inhalers.  Has EpiPen on hand if needed.  3.  ADHD Patient reports good control of ADHD with Vyvanse.  Denies any excessive stimulation, worsening anxiety or insomnia.   ROS: Per HPI  Allergies  Allergen Reactions   Diphenhydramine Other (See Comments)    Hair feels like it is crawling   Morphine Nausea  And Vomiting    PCA PUMP/ DRIP -- N/V IV PUSH IN ER NO PROBLEM PER PT.     Prednisone Nausea And Vomiting   Adhesive [Tape]     Paper tape causes irritation    Erythromycin Hives   Metformin And Related     Diarrhea with IR and XR products   Sulfonamide Derivatives Other (See Comments)    Does not take due to family history   Past Medical History:  Diagnosis Date   Allergy    Anxiety    Arthritis    Asthma    Bipolar 1 disorder (HCC)    Blood transfusion without reported diagnosis    with child birth 16 yrs ago    Bronchitis, chronic (HCC)    Constipation    COVID-19 virus infection 06/21/2020   DDD (degenerative disc disease), cervical    Depression    Diabetes mellitus without complication (HCC)    Dysphagia    Family history of adverse reaction to anesthesia    mother had a hard time waking up after knee surgery in 2021   Gastroparesis    GERD (gastroesophageal reflux disease)    History of kidney stones    History of MRSA infection    Hyperlipemia    Hypothyroid    Migraines    Pneumonia    Sleep apnea    no cpap     Current Outpatient Medications:    Accu-Chek FastClix Lancets MISC, Test blood sugars daily  Dx E11.9, Disp: 102 each, Rfl: 3   albuterol (PROVENTIL) (2.5 MG/3ML) 0.083% nebulizer solution, Take 3 mLs (2.5 mg total) by nebulization every 6 (six) hours as needed for wheezing or shortness of breath. Fill upon patient request, Disp: 75 mL, Rfl: 12   albuterol (VENTOLIN HFA) 108 (90 Base) MCG/ACT inhaler, Inhale 2 puffs into the lungs every 6 (six) hours as needed for wheezing or shortness of breath. Fill upon patient request, Disp: 8 g, Rfl: 1   Blood Glucose Monitoring Suppl (ACCU-CHEK GUIDE ME) w/Device KIT, Use to check BS BID and as needed. DX: E11.9, Disp: 1 kit, Rfl: 0   budesonide-formoterol (SYMBICORT) 160-4.5 MCG/ACT inhaler, Inhale 2 puffs into the lungs 2 (two) times daily., Disp: 1 each, Rfl: 3   Continuous Blood Gluc Receiver (FREESTYLE LIBRE  READER) DEVI, 1 Units by Does not apply route daily. UAD to test BGs daily. Dx E11.40, Disp: 1 each, Rfl: 1   Continuous Blood Gluc Sensor (FREESTYLE LIBRE 14 DAY SENSOR) MISC, 1 Units by Does not apply route daily. UAD to check blood sugars R73.09, Disp: 2 each, Rfl: 12   cyclobenzaprine (FLEXERIL) 10 MG tablet, Take 1 tablet (10 mg total) by mouth 3 (three) times daily as needed for muscle spasms., Disp: 90 tablet, Rfl: 0   dexlansoprazole (DEXILANT) 60 MG capsule, TAKE 1 CAPSULE BY MOUTH EVERY DAY (Patient taking differently: Take 60 mg by mouth at bedtime.), Disp: 30 capsule, Rfl: 3   diclofenac Sodium (VOLTAREN) 1 % GEL, Apply 4 g topically 4 (four) times daily as needed (knee pain)., Disp: 400 g, Rfl: 0   dicyclomine (BENTYL) 20 MG tablet, TAKE 1 TABLET (20 MG TOTAL) BY MOUTH EVERY 6 (SIX) HOURS. (Patient taking differently: Take 20 mg by mouth every 6 (six) hours as needed for spasms (abdominal cramps).), Disp: 360 tablet, Rfl: 0   docusate sodium (COLACE) 100 MG capsule, Take 1 capsule (100 mg total) by mouth 2 (two) times daily., Disp: 60 capsule, Rfl: 0   gabapentin (NEURONTIN) 300 MG capsule, Take 1 capsule (300 mg total) by mouth at bedtime for 3 days, THEN 1 capsule (300 mg total) 2 (two) times daily for 3 days, THEN 1 capsule (300 mg total) 3 (three) times daily. (Patient taking differently: 1 capsule (300 mg total) 3 (three) times daily.), Disp: 90 capsule, Rfl: 3   glucose blood (ACCU-CHEK GUIDE) test strip, Use as instructed E11.9, Disp: 100 strip, Rfl: 12   hydrOXYzine (ATARAX/VISTARIL) 25 MG tablet, TAKE 1 TABLET BY MOUTH THREE TIMES A DAY (Patient taking differently: Take 25 mg by mouth every 8 (eight) hours as needed for itching or anxiety.), Disp: 270 tablet, Rfl: 1   ibuprofen (ADVIL) 600 MG tablet, Take 600 mg by mouth at bedtime as needed., Disp: , Rfl:    insulin degludec (TRESIBA FLEXTOUCH) 200 UNIT/ML FlexTouch Pen, Inject 66 Units into the skin at bedtime., Disp: 15 mL, Rfl:  3   Insulin Pen Needle 31G X 5 MM MISC, 1 Units by Does not apply route daily., Disp: 100 each, Rfl: 5   lamoTRIgine (LAMICTAL) 150 MG tablet, Take 1 tablet (150 mg total) by mouth 2 (two) times daily., Disp: 60 tablet, Rfl: 2   levothyroxine (SYNTHROID) 125 MCG tablet, Take 1 tablet (125 mcg total) by mouth daily. (Patient taking differently: Take 125 mcg by mouth at bedtime.), Disp: 90 tablet, Rfl: 1   linaclotide (LINZESS) 72 MCG capsule, Take 1 capsule (72 mcg total) by mouth daily before breakfast. (Patient taking  differently: Take 72 mcg by mouth daily as needed (constipation).), Disp: 30 capsule, Rfl: 2   lisdexamfetamine (VYVANSE) 70 MG capsule, Take 1 capsule (70 mg total) by mouth daily. (Patient taking differently: Take 70 mg by mouth in the morning.), Disp: 30 capsule, Rfl: 0   lisdexamfetamine (VYVANSE) 70 MG capsule, Take 1 capsule (70 mg total) by mouth daily., Disp: 30 capsule, Rfl: 0   lisdexamfetamine (VYVANSE) 70 MG capsule, Take 1 capsule (70 mg total) by mouth daily., Disp: 30 capsule, Rfl: 0   medroxyPROGESTERone (DEPO-PROVERA) 150 MG/ML injection, Inject 1 mL (150 mg total) into the muscle every 3 (three) months., Disp: 1 mL, Rfl: 3   Mepolizumab (NUCALA) 100 MG/ML SOAJ, Inject 1 mL (100 mg total) into the skin every 28 (twenty-eight) days. Deliver to patient's home, Disp: 3 mL, Rfl: 1   montelukast (SINGULAIR) 10 MG tablet, TAKE ONE TAB BY MOUTH AT BEDTIME (Patient taking differently: Take 10 mg by mouth at bedtime.), Disp: 90 tablet, Rfl: 3   nystatin cream (MYCOSTATIN), Apply 1 application topically 2 (two) times daily. x2 weeks (Patient taking differently: Apply 1 application topically 2 (two) times daily as needed (rash).), Disp: 60 g, Rfl: 0   ondansetron (ZOFRAN) 4 MG tablet, Take 1 tablet (4 mg total) by mouth every 8 (eight) hours as needed for nausea or vomiting., Disp: 20 tablet, Rfl: 0   pioglitazone (ACTOS) 45 MG tablet, Take 1 tablet (45 mg total) by mouth daily.,  Disp: 90 tablet, Rfl: 1   promethazine (PHENERGAN) 25 MG tablet, Take 1 tablet (25 mg total) by mouth every 6 (six) hours as needed for nausea or vomiting., Disp: 30 tablet, Rfl: 0   rosuvastatin (CRESTOR) 40 MG tablet, Take 1 tablet (40 mg total) by mouth daily. (Patient taking differently: Take 40 mg by mouth at bedtime.), Disp: 90 tablet, Rfl: 3   Semaglutide (RYBELSUS) 14 MG TABS, Take 1 tablet by mouth daily before breakfast. (Patient taking differently: Take 14 mg by mouth daily before breakfast.), Disp: 30 tablet, Rfl: prn   sertraline (ZOLOFT) 100 MG tablet, Take 2 tablets (200 mg total) by mouth daily. (Patient taking differently: Take 200 mg by mouth at bedtime.), Disp: 180 tablet, Rfl: 3  Current Facility-Administered Medications:    medroxyPROGESTERone (DEPO-PROVERA) injection 150 mg, 150 mg, Intramuscular, Q90 days, Llewelyn Sheaffer M, DO, 150 mg at 09/10/20 0263 Social History   Socioeconomic History   Marital status: Divorced    Spouse name: Not on file   Number of children: Not on file   Years of education: Not on file   Highest education level: Not on file  Occupational History   Not on file  Tobacco Use   Smoking status: Former    Packs/day: 1.00    Years: 5.00    Pack years: 5.00    Types: Cigarettes    Quit date: 04/04/1993    Years since quitting: 27.6   Smokeless tobacco: Never   Tobacco comments:    smoked when went out with friends/social smoker. pack would last a week.  Vaping Use   Vaping Use: Former  Substance and Sexual Activity   Alcohol use: No   Drug use: No   Sexual activity: Yes    Birth control/protection: I.U.D.  Other Topics Concern   Not on file  Social History Narrative   Not on file   Social Determinants of Health   Financial Resource Strain: Not on file  Food Insecurity: Not on file  Transportation Needs: Not  on file  Physical Activity: Not on file  Stress: Not on file  Social Connections: Not on file  Intimate Partner  Violence: Not on file   Family History  Problem Relation Age of Onset   Mental illness Other    Diabetes Other    Healthy Mother    Cancer Father    Non-Hodgkin's lymphoma Father    Breast cancer Paternal Grandmother    Lung cancer Paternal Grandmother    Esophageal cancer Other    Colon cancer Neg Hx    Stomach cancer Neg Hx    Pancreatic cancer Neg Hx    Colon polyps Neg Hx     Objective: Office vital signs reviewed. BP 129/83   Pulse (!) 115   Temp 98.1 F (36.7 C)   Ht $R'5\' 8"'hJ$  (1.727 m)   Wt (!) 300 lb 9.6 oz (136.4 kg)   SpO2 95%   BMI 45.71 kg/m   Physical Examination:  General: Awake, alert, morbidly obese, No acute distress Cardio: regular rate and rhythm, S1S2 heard, no murmurs appreciated Pulm: clear to auscultation bilaterally, no wheezes, rhonchi or rales; normal work of breathing on room air MSK: Ambulating independently.  She has a well-healed scar along the right anterior aspect of the neck.  No evidence of secondary infection or dehiscence.  Assessment/ Plan: 50 y.o. female   Hospital discharge follow-up  Status post cervical spinal fusion  DDD (degenerative disc disease), cervical - Plan: gabapentin (NEURONTIN) 300 MG capsule  DDD (degenerative disc disease), lumbar - Plan: gabapentin (NEURONTIN) 300 MG capsule  Episodic mood disorder ([place]), Chronic - Plan: lamoTRIgine (LAMICTAL) 150 MG tablet  Attention deficit disorder (ADD) without hyperactivity - Plan: lisdexamfetamine (VYVANSE) 70 MG capsule, lisdexamfetamine (VYVANSE) 70 MG capsule  I reviewed her hospital discharge summary and recommendations.  She seems to be recovering very well from her spinal fusion.  Suspect that she will be proceeding with a lumbar intervention soon.  We will await recommendations by her neurosurgeon.  I will be glad to release her to work with restrictions.  She will see me back in the fall for this.  Neurontin has been renewed.  Mood disorder is stable.  Continue  Lamictal.  Had recent labs done.  ADHD stable.  Continue Vyvanse.  Should have 1 more month on file but I have sent in 2 additional postdated months.  The national narcotic database was reviewed and there were no red flags.  She is up-to-date on CSC and UDS.  No orders of the defined types were placed in this encounter.  No orders of the defined types were placed in this encounter.    Janora Norlander, DO [place] [phone]

## 2020-11-11 ENCOUNTER — Other Ambulatory Visit: Payer: Self-pay | Admitting: Family Medicine

## 2020-11-12 ENCOUNTER — Other Ambulatory Visit: Payer: Self-pay | Admitting: *Deleted

## 2020-11-12 MED ORDER — FREESTYLE LIBRE 2 SENSOR MISC: 1.0000 [IU] | 3 refills | Status: DC

## 2020-11-12 NOTE — Progress Notes (Signed)
Libre sensor changed from [place] 14 day to [place] 2 - 14 day  This was covered by [facility] and PA completed  Good until 07/03/21.

## 2020-11-14 ENCOUNTER — Other Ambulatory Visit (HOSPITAL_COMMUNITY): Payer: Self-pay

## 2020-11-19 ENCOUNTER — Other Ambulatory Visit (HOSPITAL_COMMUNITY): Payer: Self-pay

## 2020-11-21 ENCOUNTER — Other Ambulatory Visit (HOSPITAL_COMMUNITY): Payer: Self-pay

## 2020-11-22 ENCOUNTER — Other Ambulatory Visit (HOSPITAL_COMMUNITY): Payer: Self-pay

## 2020-11-22 ENCOUNTER — Telehealth: Payer: Self-pay

## 2020-11-22 DIAGNOSIS — M4712 Other spondylosis with myelopathy, cervical region: Secondary | ICD-10-CM | POA: Diagnosis not present

## 2020-11-22 NOTE — Telephone Encounter (Signed)
Called and spoke to patient. Scheduled her for OV on 01-13-21. Patient asked to be added to schedule for EGD at [facility] on 9-20. Will discuss in more detail with Dr. Loni Muse at [place].

## 2020-11-22 NOTE — Telephone Encounter (Signed)
-----   Message from Yetta Flock, MD sent at 11/20/2020  5:59 PM EDT ----- Regarding: RE: remain on [facility] wait list? Yes can you help book her an OV and we can discuss how she wishes to proceed? I haven't seen her since January. Thanks  ----- Message ----- From: Roetta Sessions, CMA Sent: 11/20/2020   7:00 AM EDT To: Yetta Flock, MD Subject: [facility]?                      Should patient remain on Hospital wait list for EGD?   Does she need an OV?   Thanks, Jan

## 2020-11-25 ENCOUNTER — Other Ambulatory Visit (HOSPITAL_COMMUNITY): Payer: Self-pay

## 2020-11-26 DIAGNOSIS — Z23 Encounter for immunization: Secondary | ICD-10-CM | POA: Diagnosis not present

## 2020-11-27 ENCOUNTER — Other Ambulatory Visit (HOSPITAL_COMMUNITY): Payer: Self-pay

## 2020-11-27 ENCOUNTER — Ambulatory Visit (INDEPENDENT_AMBULATORY_CARE_PROVIDER_SITE_OTHER): Payer: Medicaid Other | Admitting: *Deleted

## 2020-11-27 ENCOUNTER — Other Ambulatory Visit: Payer: Self-pay

## 2020-11-27 DIAGNOSIS — Z3042 Encounter for surveillance of injectable contraceptive: Secondary | ICD-10-CM

## 2020-11-27 MED ORDER — MEDROXYPROGESTERONE ACETATE 150 MG/ML IM SUSP
150.0000 mg | Freq: Once | INTRAMUSCULAR | Status: AC
  Administered 2020-11-27: 150 mg via INTRAMUSCULAR

## 2020-11-28 ENCOUNTER — Other Ambulatory Visit: Payer: Self-pay | Admitting: Family Medicine

## 2020-12-05 ENCOUNTER — Other Ambulatory Visit: Payer: Self-pay | Admitting: Family Medicine

## 2020-12-06 ENCOUNTER — Telehealth: Payer: Self-pay | Admitting: Family Medicine

## 2020-12-06 NOTE — Telephone Encounter (Signed)
Prescription has already been sent in, patient informed.

## 2020-12-06 NOTE — Telephone Encounter (Signed)
  Prescription Request  12/06/2020  What is the name of the medication or equipment? tresiba  Have you contacted your pharmacy to request a refill? (if applicable) no  Which pharmacy would you like this sent to? [facility]   Patient notified that their request is being sent to the clinical staff for review and that they should receive a response within 2 business days.

## 2020-12-09 ENCOUNTER — Other Ambulatory Visit: Payer: Self-pay | Admitting: Physician Assistant

## 2020-12-13 ENCOUNTER — Ambulatory Visit (INDEPENDENT_AMBULATORY_CARE_PROVIDER_SITE_OTHER): Payer: Medicaid Other | Admitting: Family Medicine

## 2020-12-13 DIAGNOSIS — M5136 Other intervertebral disc degeneration, lumbar region: Secondary | ICD-10-CM

## 2020-12-13 DIAGNOSIS — M5030 Other cervical disc degeneration, unspecified cervical region: Secondary | ICD-10-CM | POA: Diagnosis not present

## 2020-12-13 DIAGNOSIS — E1165 Type 2 diabetes mellitus with hyperglycemia: Secondary | ICD-10-CM | POA: Diagnosis not present

## 2020-12-13 NOTE — Progress Notes (Signed)
Telephone visit  Subjective: CC: work restriction? PCP: Janora Norlander, DO HPI:Judith [name] is a 50 y.o. female calls for telephone consult today. Patient provides verbal consent for consult held via phone.  Due to COVID-19 pandemic this visit was conducted virtually. This visit type was conducted due to national recommendations for restrictions regarding the COVID-19 Pandemic (e.g. social distancing, sheltering in place) in an effort to limit this patient's exposure and mitigate transmission in our community. All issues noted in this document were discussed and addressed.  A physical exam was not performed with this format.   Location of patient: home Location of provider: [facility] Others present for call: none  1. Work Release Patient has worked at [employer] and they are accommodating her with breaks. No lifting >20lbs so was not able to return to [place].  She is doing well at [facility] and she is feeling better from a mental health standpoint now that she is working with people again.  2. DM Average is around 140.  She is injecting tresiba 60-80 units daily.  Sugar has gotten better off steroids.  Doing well on Actos 41m, Rybelsus 155m  No GI symptoms.  Has botox injection scheduled to aid with swallowing.  ROS: Per HPI  Allergies  Allergen Reactions   Diphenhydramine Other (See Comments)    Hair feels like it is crawling   Morphine Nausea And Vomiting    PCA PUMP/ DRIP -- N/V IV PUSH IN ER NO PROBLEM PER PT.     Prednisone Nausea And Vomiting   Adhesive [Tape]     Paper tape causes irritation    Erythromycin Hives   Metformin And Related     Diarrhea with IR and XR products   Sulfonamide Derivatives Other (See Comments)    Does not take due to family history   Past Medical History:  Diagnosis Date   Allergy    Anxiety    Arthritis    Asthma    Bipolar 1 disorder ([facility]   Blood transfusion without reported diagnosis    with child birth 1679rs ago    Bronchitis,  chronic ([facility]   Constipation    COVID-19 virus infection 06/21/2020   DDD (degenerative disc disease), cervical    Depression    Diabetes mellitus without complication ([facility]   Dysphagia    Family history of adverse reaction to anesthesia    mother had a hard time waking up after knee surgery in 2021   Gastroparesis    GERD (gastroesophageal reflux disease)    History of kidney stones    History of MRSA infection    Hyperlipemia    Hypothyroid    Migraines    Pneumonia    Sleep apnea    no cpap     Current Outpatient Medications:    Accu-Chek FastClix Lancets MISC, Test blood sugars daily Dx E11.9, Disp: 102 each, Rfl: 3   albuterol (PROVENTIL) (2.5 MG/3ML) 0.083% nebulizer solution, Take 3 mLs (2.5 mg total) by nebulization every 6 (six) hours as needed for wheezing or shortness of breath. Fill upon patient request, Disp: 75 mL, Rfl: 12   albuterol (VENTOLIN HFA) 108 (90 Base) MCG/ACT inhaler, Inhale 2 puffs into the lungs every 6 (six) hours as needed for wheezing or shortness of breath. Fill upon patient request, Disp: 8 g, Rfl: 1   baclofen (LIORESAL) 10 MG tablet, TAKE 0.5-1 TABLETS BY MOUTH 3 TIMES DAILY AS NEEDED FOR MUSCLE SPASMS., Disp: 90 tablet,  Rfl: 1   Blood Glucose Monitoring Suppl (ACCU-CHEK GUIDE ME) w/Device KIT, Use to check BS BID and as needed. DX: E11.9, Disp: 1 kit, Rfl: 0   budesonide-formoterol (SYMBICORT) 160-4.5 MCG/ACT inhaler, Inhale 2 puffs into the lungs 2 (two) times daily., Disp: 1 each, Rfl: 3   Continuous Blood Gluc Receiver (FREESTYLE LIBRE READER) DEVI, 1 Units by Does not apply route daily. UAD to test BGs daily. Dx E11.40, Disp: 1 each, Rfl: 1   Continuous Blood Gluc Sensor (FREESTYLE LIBRE 2 SENSOR) MISC, 1 Units by Does not apply route every 14 (fourteen) days., Disp: 6 each, Rfl: 3   dexlansoprazole (DEXILANT) 60 MG capsule, TAKE 1 CAPSULE BY MOUTH EVERY DAY, Disp: 90 capsule, Rfl: 1   diclofenac Sodium (VOLTAREN) 1 % GEL, Apply 4 g topically 4  (four) times daily as needed (knee pain)., Disp: 400 g, Rfl: 0   dicyclomine (BENTYL) 20 MG tablet, TAKE 1 TABLET (20 MG TOTAL) BY MOUTH EVERY 6 (SIX) HOURS. (Patient taking differently: Take 20 mg by mouth every 6 (six) hours as needed for spasms (abdominal cramps).), Disp: 360 tablet, Rfl: 0   docusate sodium (COLACE) 100 MG capsule, Take 1 capsule (100 mg total) by mouth 2 (two) times daily., Disp: 60 capsule, Rfl: 0   gabapentin (NEURONTIN) 300 MG capsule, 1 capsule (300 mg total) 3 (three) times daily., Disp: 90 capsule, Rfl: 3   glucose blood (ACCU-CHEK GUIDE) test strip, Test blood sugars daily Dx E11.9, Disp: 100 strip, Rfl: 3   hydrOXYzine (ATARAX/VISTARIL) 25 MG tablet, TAKE 1 TABLET BY MOUTH THREE TIMES A DAY (Patient taking differently: Take 25 mg by mouth every 8 (eight) hours as needed for itching or anxiety.), Disp: 270 tablet, Rfl: 1   ibuprofen (ADVIL) 600 MG tablet, Take 600 mg by mouth at bedtime as needed., Disp: , Rfl:    Insulin Pen Needle 31G X 5 MM MISC, 1 Units by Does not apply route daily., Disp: 100 each, Rfl: 5   lamoTRIgine (LAMICTAL) 150 MG tablet, Take 1 tablet (150 mg total) by mouth 2 (two) times daily., Disp: 60 tablet, Rfl: 2   levothyroxine (SYNTHROID) 125 MCG tablet, Take 1 tablet (125 mcg total) by mouth daily. (Patient taking differently: Take 125 mcg by mouth at bedtime.), Disp: 90 tablet, Rfl: 1   linaclotide (LINZESS) 72 MCG capsule, Take 1 capsule (72 mcg total) by mouth daily before breakfast. (Patient taking differently: Take 72 mcg by mouth daily as needed (constipation).), Disp: 30 capsule, Rfl: 2   lisdexamfetamine (VYVANSE) 70 MG capsule, Take 1 capsule (70 mg total) by mouth daily., Disp: 30 capsule, Rfl: 0   [START ON 01/09/2021] lisdexamfetamine (VYVANSE) 70 MG capsule, Take 1 capsule (70 mg total) by mouth daily., Disp: 30 capsule, Rfl: 0   lisdexamfetamine (VYVANSE) 70 MG capsule, Take 1 capsule (70 mg total) by mouth daily., Disp: 30 capsule, Rfl:  0   medroxyPROGESTERone (DEPO-PROVERA) 150 MG/ML injection, Inject 1 mL (150 mg total) into the muscle every 3 (three) months., Disp: 1 mL, Rfl: 3   Mepolizumab (NUCALA) 100 MG/ML SOAJ, Inject 1 mL (100 mg total) into the skin every 28 (twenty-eight) days. Deliver to patient's home, Disp: 3 mL, Rfl: 1   montelukast (SINGULAIR) 10 MG tablet, TAKE ONE TAB BY MOUTH AT BEDTIME (Patient taking differently: Take 10 mg by mouth at bedtime.), Disp: 90 tablet, Rfl: 3   nystatin cream (MYCOSTATIN), Apply 1 application topically 2 (two) times daily. x2 weeks (Patient taking differently: Apply  1 application topically 2 (two) times daily as needed (rash).), Disp: 60 g, Rfl: 0   ondansetron (ZOFRAN) 4 MG tablet, Take 1 tablet (4 mg total) by mouth every 8 (eight) hours as needed for nausea or vomiting., Disp: 20 tablet, Rfl: 0   pioglitazone (ACTOS) 45 MG tablet, Take 1 tablet (45 mg total) by mouth daily., Disp: 90 tablet, Rfl: 1   promethazine (PHENERGAN) 25 MG tablet, Take 1 tablet (25 mg total) by mouth every 6 (six) hours as needed for nausea or vomiting., Disp: 30 tablet, Rfl: 0   rosuvastatin (CRESTOR) 40 MG tablet, Take 1 tablet (40 mg total) by mouth daily. (Patient taking differently: Take 40 mg by mouth at bedtime.), Disp: 90 tablet, Rfl: 3   Semaglutide (RYBELSUS) 14 MG TABS, Take 1 tablet by mouth daily before breakfast. (Patient taking differently: Take 14 mg by mouth daily before breakfast.), Disp: 30 tablet, Rfl: prn   sertraline (ZOLOFT) 100 MG tablet, Take 2 tablets (200 mg total) by mouth daily. (Patient taking differently: Take 200 mg by mouth at bedtime.), Disp: 180 tablet, Rfl: 3   TRESIBA FLEXTOUCH 200 UNIT/ML FlexTouch Pen, INJECT 66 UNITS INTO THE SKIN AT BEDTIME., Disp: 3 mL, Rfl: 3  Current Facility-Administered Medications:    medroxyPROGESTERone (DEPO-PROVERA) injection 150 mg, 150 mg, Intramuscular, Q90 days, Markala Sitts M, DO, 150 mg at 09/10/20 3428  Assessment/ Plan: 50 y.o.  female   DDD (degenerative disc disease), cervical  DDD (degenerative disc disease), lumbar  Uncontrolled type 2 diabetes mellitus with hyperglycemia ([place]) - Plan: Lipid panel, Bayer DCA Hb A1c Waived, CMP14+EGFR  Status post spinal fusion.  Work release notes have been provided.  Okay to return to light duties with no more than 20 pound lift.  She has follow-up with her spinal surgeon in February and they have recommended this until their follow-up in February.  Future labs have been placed for her diabetes, which we will see each other again for in September.  It sounds like she is improving from a sugar standpoint now that she is off of steroids.  No GI symptoms from the Rybelsus standpoint which is excellent.  Awaiting how she responds to the Botox injections for dysphagia.  Start time: 3:44pm (called, LVM); 3:45pm End time: 3:56pm  Total time spent on patient care (including telephone call/ virtual visit): 11 minutes  [place], [place] [phone]

## 2020-12-17 ENCOUNTER — Ambulatory Visit: Payer: Medicaid Other | Admitting: Family Medicine

## 2020-12-18 ENCOUNTER — Other Ambulatory Visit (HOSPITAL_COMMUNITY): Payer: Self-pay

## 2020-12-20 ENCOUNTER — Other Ambulatory Visit: Payer: Self-pay | Admitting: Family Medicine

## 2020-12-20 ENCOUNTER — Other Ambulatory Visit (HOSPITAL_COMMUNITY): Payer: Self-pay

## 2020-12-20 ENCOUNTER — Telehealth: Payer: Self-pay | Admitting: Family Medicine

## 2020-12-20 DIAGNOSIS — Z1231 Encounter for screening mammogram for malignant neoplasm of breast: Secondary | ICD-10-CM

## 2020-12-20 NOTE — Telephone Encounter (Signed)
Discussed libre with patient  She will try to continue to use Sample given

## 2020-12-20 NOTE — Telephone Encounter (Signed)
Pt states that she has had a lot of trouble with the Judith [name] and wants to know if [name] can contact her insurance to see if they can approve her to try something else. Does not want to deal with the [place] anymore.  Please advise and call patient with update.

## 2021-01-03 ENCOUNTER — Other Ambulatory Visit: Payer: Self-pay | Admitting: Family Medicine

## 2021-01-13 ENCOUNTER — Telehealth: Payer: Self-pay

## 2021-01-13 ENCOUNTER — Telehealth (INDEPENDENT_AMBULATORY_CARE_PROVIDER_SITE_OTHER): Payer: Medicaid Other | Admitting: Gastroenterology

## 2021-01-13 DIAGNOSIS — K219 Gastro-esophageal reflux disease without esophagitis: Secondary | ICD-10-CM | POA: Diagnosis not present

## 2021-01-13 DIAGNOSIS — R1310 Dysphagia, unspecified: Secondary | ICD-10-CM

## 2021-01-13 DIAGNOSIS — K5900 Constipation, unspecified: Secondary | ICD-10-CM | POA: Diagnosis not present

## 2021-01-13 DIAGNOSIS — K3184 Gastroparesis: Secondary | ICD-10-CM | POA: Diagnosis not present

## 2021-01-13 MED ORDER — DEXLANSOPRAZOLE 60 MG PO CPDR: 60.0000 mg | DELAYED_RELEASE_CAPSULE | Freq: Every day | ORAL | 3 refills | Status: DC

## 2021-01-13 NOTE — Patient Instructions (Addendum)
If you are age 51 or older, your body mass index should be between 23-30. Your There is no height or weight on file to calculate BMI. If this is out of the aforementioned range listed, please consider follow up with your Primary Care Provider.  If you are age 70 or younger, your body mass index should be between 19-25. Your There is no height or weight on file to calculate BMI. If this is out of the aformentioned range listed, please consider follow up with your Primary Care Provider.   __________________________________________________________  The [facility] providers would like to encourage you to use [facility] to communicate with providers for non-urgent requests or questions.  Due to long hold times on the telephone, sending your provider a message by [facility] may be a faster and more efficient way to get a response.  Please allow 48 business hours for a response.  Please remember that this is for non-urgent requests.    You have been scheduled for an endoscopy at [facility] on Tuesday, 9-20 at 7:30am. Please follow written instructions given to you at your visit today. If you use inhalers (even only as needed), please bring them with you on the day of your procedure.    We have sent the following medications to your pharmacy for you to pick up at your convenience: Dexilant 60 [id number] once daily  Thank you for entrusting me with your care and for choosing [facility], Dr. Oakbrook Cellar

## 2021-01-13 NOTE — Progress Notes (Signed)
THIS ENCOUNTER IS A VIRTUAL VISIT. PATIENT WAS NOT SEEN IN THE OFFICE. PATIENT HAS CONSENTED TO VIRTUAL VISIT / TELEMEDICINE VISIT   Location of patient: work Location of provider: office Persons participating: myself, patient    HPI :  50 year old female known to our practice for GERD, dysphagia, gastroparesis, constipation, here for follow-up visit to discuss all these issues.   Recall one of her main symptoms has been dysphagia over the years.  She has had this ongoing for some time.  She had an EGD and 2008 with Dr. Deatra Ina with empiric Botox injection at the LES which did provide some benefit but she had never had any manometry.  I performed an EGD with empiric dilation back in September 2021, she did not have any benefit with the dilation at that time.  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  I had discussed options with her at that time and we had planned for an EGD at the hospital for Botox injection of the LES to see if that would provide relief again.  Unfortunately she had canceled her endoscopy in April that was scheduled and is now following up.  She states she continues to feel dysphagia to both solids and liquids.  This happens with most meals.  She states she gets "choked" recently.  No food impaction since have seen her.  She is also had a history of GERD, gastroparesis, constipation.  She states that she was on Trulicity before and since stopping that a lot of the symptoms have improved.  I had her on some Reglan for gastroparesis which helped her but she ended up stopping it as she does not think she needs it anymore.  She states the Dexilant is working "great" for her reflux and she is quite happy with the regimen.  She has not really needed to take much Zofran.  She is no longer on any narcotics for chronic pain, using gabapentin for control of that.  She previously had constipation was using  Linzess, she has not really needed to use that routinely any longer, uses it as needed as well stool softener but generally pretty happy with the rest of her bowel symptoms.  Otherwise she denies any cardiopulmonary symptoms at this time.  She does have a history of asthma for which she is getting Nucala injections and states it is working quite well for her.  She has not had a flare of her asthma and for a few months now and is feeling better.  Wishes to proceed with endoscopy as scheduled in September.  Most recent workup: EGD 02/07/2020 - - A 1 cm hiatal hernia was present. - The exam of the esophagus was otherwise normal. No obvious stenosis / stricture or inflammatory changes noted. - A guidewire was placed and the scope was withdrawn. Empiric dilation was performed in the entire esophagus with a Savary dilator with mild resistance at 17 mm and 18 mm. Relook endoscopy showed no mucosal wrents. Biopsies were taken with a cold forceps in the upper third of the esophagus, in the middle third of the esophagus and in the lower third of the esophagus for histology. - The entire examined stomach was normal. Biopsies were taken with a cold forceps for Helicobacter pylori testing. - The duodenal bulb and second portion of the duodenum were normal.   Colonoscopy 02/07/20 - The perianal and digital rectal examinations were normal. - The terminal ileum appeared  normal. - Two sessile polyps were found in the cecum. The polyps were 3 mm in size. These polyps were removed with a cold snare. Resection and retrieval were complete. - A 3 mm polyp was found in the ascending colon. The polyp was sessile. The polyp was removed with a cold snare. Resection and retrieval were complete. - A 3 mm polyp was found in the transverse colon. The polyp was sessile. The polyp was removed with a cold snare. Resection and retrieval were complete. - Internal hemorrhoids were found during retroflexion. - The exam was  otherwise without abnormality. - Biopsies for histology were taken with a cold forceps from the right colon and left colon for evaluation of microscopic colitis.   1. Surgical [P], gastric antrum and gastric body - GASTRIC ANTRAL AND OXYNTIC MUCOSA WITH NONSPECIFIC REACTIVE GASTROPATHY - WARTHIN STARRY STAIN IS NEGATIVE FOR HELICOBACTER PYLORI 2. Surgical [P], esophageal - ESOPHAGEAL SQUAMOUS MUCOSA WITH VASCULAR CONGESTION, VASCULAR LAKES, AND SQUAMOUS BALLOONING, CONSISTENT WITH REFLUX ESOPHAGITIS - NEGATIVE FOR INCREASED INTRAEPITHELIAL EOSINOPHILS 3. 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 Surgical [P], random colon - COLONIC MUCOSA WITH NO SPECIFIC HISTOPATHOLOGIC CHANGES - NEGATIVE FOR ACUTE INFLAMMATION, INCREASED INTRAEPITHELIAL LYMPHOCYTES OR THICKENED SUBEPITHELIAL COLLAGEN TABLE     GES 04/01/20 - consistent with gastroparesis   CT scan abdomen pelvis with contrast - [facility] 10/31/19 - FINDINGS:   The visualized portions of the lower chest are unremarkable.   The gallbladder surgically absent. The liver, spleen, pancreas, and adrenal glands are unremarkable.   The kidneys enhance symmetrically without hydronephrosis. The bladder is decompressed. The uterus is present. IUD is in place.   The small bowel is nondilated. The colon is unremarkable. The appendix is not definitively seen and may be absent. No free air or free fluid. No bulky adenopathy. Mild aortoiliac atherosclerotic disease.   No acute osseous abnormality is seen. There is degenerative change in the spine.    IMPRESSION:  1.  No acute intra-abdominal abnormality.      Manometry 07/08/20 - EGJ outflow obstruction   Past Medical History:  Diagnosis Date   Allergy     Anxiety    Arthritis    Asthma    Bipolar 1 disorder ([place])    Blood transfusion without reported diagnosis    with child birth 72 yrs ago    Bronchitis, chronic ([place])    Constipation    COVID-19 virus infection 06/21/2020   DDD (degenerative disc disease), cervical    Depression    Diabetes mellitus without complication ([place])    Dysphagia    Family history of adverse reaction to anesthesia    mother had a hard time waking up after knee surgery in 2021   Gastroparesis    GERD (gastroesophageal reflux disease)    History of kidney stones    History of MRSA infection    Hyperlipemia    Hypothyroid    Migraines    Pneumonia    Sleep apnea    no cpap      Past Surgical History:  Procedure Laterality Date   ABDOMINAL SURGERY     ANTERIOR CERVICAL DECOMP/DISCECTOMY FUSION N/A 10/09/2020   Procedure: Anterior Cervical Discectomy Fusion Cervical five-six;  Surgeon: Vallarie Mare, MD;  Location: [place];  Service: Neurosurgery;  Laterality: N/A;   CARPAL  TUNNEL RELEASE Bilateral 1999   CESAREAN SECTION     CHOLECYSTECTOMY     COLONOSCOPY     ESOPHAGEAL MANOMETRY N/A 07/08/2020   Procedure: ESOPHAGEAL MANOMETRY (EM);  Surgeon: Emmerson Taddei P, MD;  Location: [facility];  Service: Gastroenterology;  Laterality: N/A;   REPLACEMENT TOTAL KNEE Bilateral    THYROIDECTOMY     TONSILLECTOMY     TONSILLECTOMY AND ADENOIDECTOMY     UPPER GASTROINTESTINAL ENDOSCOPY     Family History  Problem Relation Age of Onset   Mental illness Other    Diabetes Other    Healthy Mother    Cancer Father    Non-Hodgkin's lymphoma Father    Breast cancer Paternal Grandmother    Lung cancer Paternal Grandmother    Esophageal cancer Other    Colon cancer Neg Hx    Stomach cancer Neg Hx    Pancreatic cancer Neg Hx    Colon polyps Neg Hx    Social History   Tobacco Use   Smoking status: Former    Packs/day: 1.00    Years: 5.00    Pack years: 5.00    Types: Cigarettes    Quit  date: 04/04/1993    Years since quitting: 27.7   Smokeless tobacco: Never   Tobacco comments:    smoked when went out with friends/social smoker. pack would last a week.  Vaping Use   Vaping Use: Former  Substance Use Topics   Alcohol use: No   Drug use: No   Current Outpatient Medications  Medication Sig Dispense Refill   Accu-Chek FastClix Lancets MISC Test blood sugars daily Dx E11.9 102 each 3   albuterol (PROVENTIL) (2.5 MG/3ML) 0.083% nebulizer solution Take 3 mLs (2.5 mg total) by nebulization every 6 (six) hours as needed for wheezing or shortness of breath. Fill upon patient request 75 mL 12   albuterol (VENTOLIN HFA) 108 (90 Base) MCG/ACT inhaler Inhale 2 puffs into the lungs every 6 (six) hours as needed for wheezing or shortness of breath. Fill upon patient request 8 g 1   baclofen (LIORESAL) 10 MG tablet TAKE 0.5-1 TABLETS BY MOUTH 3 TIMES DAILY AS NEEDED FOR MUSCLE SPASMS. 90 tablet 1   Blood Glucose Monitoring Suppl (ACCU-CHEK GUIDE ME) w/Device KIT Use to check BS BID and as needed. DX: E11.9 1 kit 0   budesonide-formoterol (SYMBICORT) 160-4.5 MCG/ACT inhaler Inhale 2 puffs into the lungs 2 (two) times daily. 1 each 3   Continuous Blood Gluc Receiver (FREESTYLE LIBRE READER) DEVI 1 Units by Does not apply route daily. UAD to test BGs daily. Dx E11.40 1 each 1   Continuous Blood Gluc Sensor (FREESTYLE LIBRE 2 SENSOR) MISC 1 Units by Does not apply route every 14 (fourteen) days. 6 each 3   dexlansoprazole (DEXILANT) 60 MG capsule TAKE 1 CAPSULE BY MOUTH EVERY DAY 90 capsule 1   diclofenac Sodium (VOLTAREN) 1 % GEL Apply 4 g topically 4 (four) times daily as needed (knee pain). 400 g 0   dicyclomine (BENTYL) 20 MG tablet TAKE 1 TABLET (20 MG TOTAL) BY MOUTH EVERY 6 (SIX) HOURS. (Patient taking differently: Take 20 mg by mouth every 6 (six) hours as needed for spasms (abdominal cramps).) 360 tablet 0   docusate sodium (COLACE) 100 MG capsule Take 1 capsule (100 mg total) by mouth  2 (two) times daily. 60 capsule 0   gabapentin (NEURONTIN) 300 MG capsule 1 capsule (300 mg total) 3 (three) times daily. 90 capsule 3   glucose   blood (ACCU-CHEK GUIDE) test strip Test blood sugars daily Dx E11.9 100 strip 3   hydrOXYzine (ATARAX/VISTARIL) 25 MG tablet TAKE 1 TABLET BY MOUTH THREE TIMES A DAY (Patient taking differently: Take 25 mg by mouth every 8 (eight) hours as needed for itching or anxiety.) 270 tablet 1   ibuprofen (ADVIL) 600 MG tablet Take 600 mg by mouth at bedtime as needed.     Insulin Pen Needle 31G X 5 MM MISC 1 Units by Does not apply route daily. 100 each 5   lamoTRIgine (LAMICTAL) 150 MG tablet Take 1 tablet (150 mg total) by mouth 2 (two) times daily. 60 tablet 2   levothyroxine (SYNTHROID) 125 MCG tablet Take 1 tablet (125 mcg total) by mouth daily. (Patient taking differently: Take 125 mcg by mouth at bedtime.) 90 tablet 1   linaclotide (LINZESS) 72 MCG capsule Take 1 capsule (72 mcg total) by mouth daily before breakfast. (Patient taking differently: Take 72 mcg by mouth daily as needed (constipation).) 30 capsule 2   lisdexamfetamine (VYVANSE) 70 MG capsule Take 1 capsule (70 mg total) by mouth daily. 30 capsule 0   lisdexamfetamine (VYVANSE) 70 MG capsule Take 1 capsule (70 mg total) by mouth daily. 30 capsule 0   lisdexamfetamine (VYVANSE) 70 MG capsule Take 1 capsule (70 mg total) by mouth daily. 30 capsule 0   medroxyPROGESTERone (DEPO-PROVERA) 150 MG/ML injection Inject 1 mL (150 mg total) into the muscle every 3 (three) months. 1 mL 3   Mepolizumab (NUCALA) 100 MG/ML SOAJ Inject 1 mL (100 mg total) into the skin every 28 (twenty-eight) days. Deliver to patient's home 3 mL 1   montelukast (SINGULAIR) 10 MG tablet TAKE ONE TAB BY MOUTH AT BEDTIME (Patient taking differently: Take 10 mg by mouth at bedtime.) 90 tablet 3   nystatin cream (MYCOSTATIN) Apply 1 application topically 2 (two) times daily. x2 weeks (Patient taking differently: Apply 1 application  topically 2 (two) times daily as needed (rash).) 60 g 0   ondansetron (ZOFRAN) 4 MG tablet Take 1 tablet (4 mg total) by mouth every 8 (eight) hours as needed for nausea or vomiting. 20 tablet 0   pioglitazone (ACTOS) 45 MG tablet Take 1 tablet (45 mg total) by mouth daily. 90 tablet 1   promethazine (PHENERGAN) 25 MG tablet Take 1 tablet (25 mg total) by mouth every 6 (six) hours as needed for nausea or vomiting. 30 tablet 0   rosuvastatin (CRESTOR) 40 MG tablet Take 1 tablet (40 mg total) by mouth daily. (Patient taking differently: Take 40 mg by mouth at bedtime.) 90 tablet 3   Semaglutide (RYBELSUS) 14 MG TABS Take 1 tablet by mouth daily before breakfast. (Patient taking differently: Take 14 mg by mouth daily before breakfast.) 30 tablet prn   sertraline (ZOLOFT) 100 MG tablet Take 2 tablets (200 mg total) by mouth daily. (Patient taking differently: Take 200 mg by mouth at bedtime.) 180 tablet 3   TRESIBA FLEXTOUCH 200 UNIT/ML FlexTouch Pen INJECT 66 UNITS INTO THE SKIN AT BEDTIME. 3 mL 3   Current Facility-Administered Medications  Medication Dose Route Frequency Provider Last Rate Last Admin   medroxyPROGESTERone (DEPO-PROVERA) injection 150 mg  150 mg Intramuscular Q90 days Gottschalk, Ashly M, DO   150 mg at 09/10/20 0841   Allergies  Allergen Reactions   Diphenhydramine Other (See Comments)    Hair feels like it is crawling   Morphine Nausea And Vomiting    PCA PUMP/ DRIP -- N/V IV PUSH IN ER   NO PROBLEM PER PT.     Prednisone Nausea And Vomiting   Adhesive [Tape]     Paper tape causes irritation    Erythromycin Hives   Metformin And Related     Diarrhea with IR and XR products   Sulfonamide Derivatives Other (See Comments)    Does not take due to family history     Review of Systems: All systems reviewed and negative except where noted in HPI.   Lab Results  Component Value Date   WBC 8.1 10/07/2020   HGB 14.2 10/07/2020   HCT 43.2 10/07/2020   MCV 83.1 10/07/2020    PLT 360 10/07/2020   Lab Results  Component Value Date   CREATININE 0.82 10/07/2020   BUN 11 10/07/2020   NA 137 10/07/2020   K 3.8 10/07/2020   CL 102 10/07/2020   CO2 25 10/07/2020    Lab Results  Component Value Date   ALT 12 09/10/2020   AST 10 09/10/2020   ALKPHOS 129 (H) 09/10/2020   BILITOT 0.3 09/10/2020     Physical Exam: There were no vitals taken for this visit. Patient appeared healthy and in no distress on video   ASSESSMENT AND PLAN: 50 year old female here for reassessment of the following:  Dysphagia /GEJ outflow obstruction GERD Gastroparesis Constipation  Generally she has been doing pretty well in regards to most of her upper tract symptoms and constipation since have last seen her, suspect perhaps her diabetes regimen, Trulicity, was playing a role in some of this.  She is now off Reglan which is good news and her Dexilant is doing a good job controlling her reflux.  Her constipation is well controlled and using Linzess only as needed.  Main problem bothering her continues to be dysphagia.  We performed an EGD with empiric dilation which has not provided relief in the past.  Manometry is consistent with a GJ outflow obstruction.  She has had a response to Botox injection remotely in 2008.  I discussed the treatment options with her and she wanted to proceed with a trial of Botox injection again rather than evaluation at tertiary care facility.  I discussed endoscopy with her, risk benefits of the exam and anesthesia and she agrees to proceed.  She is scheduled to have this done at the hospital next month.  Further recommendations pending the results.  All questions answered  Jolly Mango, MD [facility]

## 2021-01-13 NOTE — Telephone Encounter (Signed)
Patient called to say she can't come in for her appointment today. She started a new job and wanted to know if she could do as a virtual visit.  Patient has MyChart.  [name] medications, allergies, medical and surgical Hx was reviewed. Patient knows to sign in to MyChart prior to 3:00pm and Dr. Havery Moros will join her for the visit. She did not want to schedule a Pre visit to be instructed for her already scheduled EGD at [facility] on Tuesday, 9-20 but would prefer instructions be sent to her MyChart. Appointment type changed.

## 2021-01-13 NOTE — Telephone Encounter (Signed)
Okay that's fine thanks

## 2021-01-13 NOTE — H&P (View-Only) (Signed)
THIS ENCOUNTER IS A VIRTUAL VISIT. PATIENT WAS NOT SEEN IN THE OFFICE. PATIENT HAS CONSENTED TO VIRTUAL VISIT / TELEMEDICINE VISIT   Location of patient: work Location of provider: office Persons participating: myself, patient    HPI :  50 year old female known to our practice for GERD, dysphagia, gastroparesis, constipation, here for follow-up visit to discuss all these issues.   Recall one of her main symptoms has been dysphagia over the years.  She has had this ongoing for some time.  She had an EGD and 2008 with Dr. Deatra Ina with empiric Botox injection at the LES which did provide some benefit but she had never had any manometry.  I performed an EGD with empiric dilation back in September 2021, she did not have any benefit with the dilation at that time.  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  I had discussed options with her at that time and we had planned for an EGD at the hospital for Botox injection of the LES to see if that would provide relief again.  Unfortunately she had canceled her endoscopy in April that was scheduled and is now following up.  She states she continues to feel dysphagia to both solids and liquids.  This happens with most meals.  She states she gets "choked" recently.  No food impaction since have seen her.  She is also had a history of GERD, gastroparesis, constipation.  She states that she was on Trulicity before and since stopping that a lot of the symptoms have improved.  I had her on some Reglan for gastroparesis which helped her but she ended up stopping it as she does not think she needs it anymore.  She states the Dexilant is working "great" for her reflux and she is quite happy with the regimen.  She has not really needed to take much Zofran.  She is no longer on any narcotics for chronic pain, using gabapentin for control of that.  She previously had constipation was using  Linzess, she has not really needed to use that routinely any longer, uses it as needed as well stool softener but generally pretty happy with the rest of her bowel symptoms.  Otherwise she denies any cardiopulmonary symptoms at this time.  She does have a history of asthma for which she is getting Nucala injections and states it is working quite well for her.  She has not had a flare of her asthma and for a few months now and is feeling better.  Wishes to proceed with endoscopy as scheduled in September.  Most recent workup: EGD 02/07/2020 - - A 1 cm hiatal hernia was present. - The exam of the esophagus was otherwise normal. No obvious stenosis / stricture or inflammatory changes noted. - A guidewire was placed and the scope was withdrawn. Empiric dilation was performed in the entire esophagus with a Savary dilator with mild resistance at 17 mm and 18 mm. Relook endoscopy showed no mucosal wrents. Biopsies were taken with a cold forceps in the upper third of the esophagus, in the middle third of the esophagus and in the lower third of the esophagus for histology. - The entire examined stomach was normal. Biopsies were taken with a cold forceps for Helicobacter pylori testing. - The duodenal bulb and second portion of the duodenum were normal.   Colonoscopy 02/07/20 - The perianal and digital rectal examinations were normal. - The terminal ileum appeared  normal. - Two sessile polyps were found in the cecum. The polyps were 3 mm in size. These polyps were removed with a cold snare. Resection and retrieval were complete. - A 3 mm polyp was found in the ascending colon. The polyp was sessile. The polyp was removed with a cold snare. Resection and retrieval were complete. - A 3 mm polyp was found in the transverse colon. The polyp was sessile. The polyp was removed with a cold snare. Resection and retrieval were complete. - Internal hemorrhoids were found during retroflexion. - The exam was  otherwise without abnormality. - Biopsies for histology were taken with a cold forceps from the right colon and left colon for evaluation of microscopic colitis.   1. Surgical [P], gastric antrum and gastric body - GASTRIC ANTRAL AND OXYNTIC MUCOSA WITH NONSPECIFIC REACTIVE GASTROPATHY - WARTHIN STARRY STAIN IS NEGATIVE FOR HELICOBACTER PYLORI 2. Surgical [P], esophageal - ESOPHAGEAL SQUAMOUS MUCOSA WITH VASCULAR CONGESTION, VASCULAR LAKES, AND SQUAMOUS BALLOONING, CONSISTENT WITH REFLUX ESOPHAGITIS - NEGATIVE FOR INCREASED INTRAEPITHELIAL EOSINOPHILS 3. 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 Surgical [P], random colon - COLONIC MUCOSA WITH NO SPECIFIC HISTOPATHOLOGIC CHANGES - NEGATIVE FOR ACUTE INFLAMMATION, INCREASED INTRAEPITHELIAL LYMPHOCYTES OR THICKENED SUBEPITHELIAL COLLAGEN TABLE     GES 04/01/20 - consistent with gastroparesis   CT scan abdomen pelvis with contrast - [facility] 10/31/19 - FINDINGS:   The visualized portions of the lower chest are unremarkable.   The gallbladder surgically absent. The liver, spleen, pancreas, and adrenal glands are unremarkable.   The kidneys enhance symmetrically without hydronephrosis. The bladder is decompressed. The uterus is present. IUD is in place.   The small bowel is nondilated. The colon is unremarkable. The appendix is not definitively seen and may be absent. No free air or free fluid. No bulky adenopathy. Mild aortoiliac atherosclerotic disease.   No acute osseous abnormality is seen. There is degenerative change in the spine.    IMPRESSION:  1.  No acute intra-abdominal abnormality.      Manometry 07/08/20 - EGJ outflow obstruction   Past Medical History:  Diagnosis Date   Allergy     Anxiety    Arthritis    Asthma    Bipolar 1 disorder ([place])    Blood transfusion without reported diagnosis    with child birth 31 yrs ago    Bronchitis, chronic ([place])    Constipation    COVID-19 virus infection 06/21/2020   DDD (degenerative disc disease), cervical    Depression    Diabetes mellitus without complication ([place])    Dysphagia    Family history of adverse reaction to anesthesia    mother had a hard time waking up after knee surgery in 2021   Gastroparesis    GERD (gastroesophageal reflux disease)    History of kidney stones    History of MRSA infection    Hyperlipemia    Hypothyroid    Migraines    Pneumonia    Sleep apnea    no cpap      Past Surgical History:  Procedure Laterality Date   ABDOMINAL SURGERY     ANTERIOR CERVICAL DECOMP/DISCECTOMY FUSION N/A 10/09/2020   Procedure: Anterior Cervical Discectomy Fusion Cervical five-six;  Surgeon: Vallarie Mare, MD;  Location: [place];  Service: Neurosurgery;  Laterality: N/A;   CARPAL  TUNNEL RELEASE Bilateral 1999   CESAREAN SECTION     CHOLECYSTECTOMY     COLONOSCOPY     ESOPHAGEAL MANOMETRY N/A 07/08/2020   Procedure: ESOPHAGEAL MANOMETRY (EM);  Surgeon: Yetta Flock, MD;  Location: [facility];  Service: Gastroenterology;  Laterality: N/A;   REPLACEMENT TOTAL KNEE Bilateral    THYROIDECTOMY     TONSILLECTOMY     TONSILLECTOMY AND ADENOIDECTOMY     UPPER GASTROINTESTINAL ENDOSCOPY     Family History  Problem Relation Age of Onset   Mental illness Other    Diabetes Other    Healthy Mother    Cancer Father    Non-Hodgkin's lymphoma Father    Breast cancer Paternal Grandmother    Lung cancer Paternal Grandmother    Esophageal cancer Other    Colon cancer Neg Hx    Stomach cancer Neg Hx    Pancreatic cancer Neg Hx    Colon polyps Neg Hx    Social History   Tobacco Use   Smoking status: Former    Packs/day: 1.00    Years: 5.00    Pack years: 5.00    Types: Cigarettes    Quit  date: 04/04/1993    Years since quitting: 27.7   Smokeless tobacco: Never   Tobacco comments:    smoked when went out with friends/social smoker. pack would last a week.  Vaping Use   Vaping Use: Former  Substance Use Topics   Alcohol use: No   Drug use: No   Current Outpatient Medications  Medication Sig Dispense Refill   Accu-Chek FastClix Lancets MISC Test blood sugars daily Dx E11.9 102 each 3   albuterol (PROVENTIL) (2.5 MG/3ML) 0.083% nebulizer solution Take 3 mLs (2.5 mg total) by nebulization every 6 (six) hours as needed for wheezing or shortness of breath. Fill upon patient request 75 mL 12   albuterol (VENTOLIN HFA) 108 (90 Base) MCG/ACT inhaler Inhale 2 puffs into the lungs every 6 (six) hours as needed for wheezing or shortness of breath. Fill upon patient request 8 g 1   baclofen (LIORESAL) 10 MG tablet TAKE 0.5-1 TABLETS BY MOUTH 3 TIMES DAILY AS NEEDED FOR MUSCLE SPASMS. 90 tablet 1   Blood Glucose Monitoring Suppl (ACCU-CHEK GUIDE ME) w/Device KIT Use to check BS BID and as needed. DX: E11.9 1 kit 0   budesonide-formoterol (SYMBICORT) 160-4.5 MCG/ACT inhaler Inhale 2 puffs into the lungs 2 (two) times daily. 1 each 3   Continuous Blood Gluc Receiver (FREESTYLE LIBRE READER) DEVI 1 Units by Does not apply route daily. UAD to test BGs daily. Dx E11.40 1 each 1   Continuous Blood Gluc Sensor (FREESTYLE LIBRE 2 SENSOR) MISC 1 Units by Does not apply route every 14 (fourteen) days. 6 each 3   dexlansoprazole (DEXILANT) 60 MG capsule TAKE 1 CAPSULE BY MOUTH EVERY DAY 90 capsule 1   diclofenac Sodium (VOLTAREN) 1 % GEL Apply 4 g topically 4 (four) times daily as needed (knee pain). 400 g 0   dicyclomine (BENTYL) 20 MG tablet TAKE 1 TABLET (20 MG TOTAL) BY MOUTH EVERY 6 (SIX) HOURS. (Patient taking differently: Take 20 mg by mouth every 6 (six) hours as needed for spasms (abdominal cramps).) 360 tablet 0   docusate sodium (COLACE) 100 MG capsule Take 1 capsule (100 mg total) by mouth  2 (two) times daily. 60 capsule 0   gabapentin (NEURONTIN) 300 MG capsule 1 capsule (300 mg total) 3 (three) times daily. 90 capsule 3   glucose  blood (ACCU-CHEK GUIDE) test strip Test blood sugars daily Dx E11.9 100 strip 3   hydrOXYzine (ATARAX/VISTARIL) 25 MG tablet TAKE 1 TABLET BY MOUTH THREE TIMES A DAY (Patient taking differently: Take 25 mg by mouth every 8 (eight) hours as needed for itching or anxiety.) 270 tablet 1   ibuprofen (ADVIL) 600 MG tablet Take 600 mg by mouth at bedtime as needed.     Insulin Pen Needle 31G X 5 MM MISC 1 Units by Does not apply route daily. 100 each 5   lamoTRIgine (LAMICTAL) 150 MG tablet Take 1 tablet (150 mg total) by mouth 2 (two) times daily. 60 tablet 2   levothyroxine (SYNTHROID) 125 MCG tablet Take 1 tablet (125 mcg total) by mouth daily. (Patient taking differently: Take 125 mcg by mouth at bedtime.) 90 tablet 1   linaclotide (LINZESS) 72 MCG capsule Take 1 capsule (72 mcg total) by mouth daily before breakfast. (Patient taking differently: Take 72 mcg by mouth daily as needed (constipation).) 30 capsule 2   lisdexamfetamine (VYVANSE) 70 MG capsule Take 1 capsule (70 mg total) by mouth daily. 30 capsule 0   lisdexamfetamine (VYVANSE) 70 MG capsule Take 1 capsule (70 mg total) by mouth daily. 30 capsule 0   lisdexamfetamine (VYVANSE) 70 MG capsule Take 1 capsule (70 mg total) by mouth daily. 30 capsule 0   medroxyPROGESTERone (DEPO-PROVERA) 150 MG/ML injection Inject 1 mL (150 mg total) into the muscle every 3 (three) months. 1 mL 3   Mepolizumab (NUCALA) 100 MG/ML SOAJ Inject 1 mL (100 mg total) into the skin every 28 (twenty-eight) days. Deliver to patient's home 3 mL 1   montelukast (SINGULAIR) 10 MG tablet TAKE ONE TAB BY MOUTH AT BEDTIME (Patient taking differently: Take 10 mg by mouth at bedtime.) 90 tablet 3   nystatin cream (MYCOSTATIN) Apply 1 application topically 2 (two) times daily. x2 weeks (Patient taking differently: Apply 1 application  topically 2 (two) times daily as needed (rash).) 60 g 0   ondansetron (ZOFRAN) 4 MG tablet Take 1 tablet (4 mg total) by mouth every 8 (eight) hours as needed for nausea or vomiting. 20 tablet 0   pioglitazone (ACTOS) 45 MG tablet Take 1 tablet (45 mg total) by mouth daily. 90 tablet 1   promethazine (PHENERGAN) 25 MG tablet Take 1 tablet (25 mg total) by mouth every 6 (six) hours as needed for nausea or vomiting. 30 tablet 0   rosuvastatin (CRESTOR) 40 MG tablet Take 1 tablet (40 mg total) by mouth daily. (Patient taking differently: Take 40 mg by mouth at bedtime.) 90 tablet 3   Semaglutide (RYBELSUS) 14 MG TABS Take 1 tablet by mouth daily before breakfast. (Patient taking differently: Take 14 mg by mouth daily before breakfast.) 30 tablet prn   sertraline (ZOLOFT) 100 MG tablet Take 2 tablets (200 mg total) by mouth daily. (Patient taking differently: Take 200 mg by mouth at bedtime.) 180 tablet 3   TRESIBA FLEXTOUCH 200 UNIT/ML FlexTouch Pen INJECT 66 UNITS INTO THE SKIN AT BEDTIME. 3 mL 3   Current Facility-Administered Medications  Medication Dose Route Frequency Provider Last Rate Last Admin   medroxyPROGESTERone (DEPO-PROVERA) injection 150 mg  150 mg Intramuscular Q90 days Gottschalk, Ashly M, DO   150 mg at 09/10/20 1975   Allergies  Allergen Reactions   Diphenhydramine Other (See Comments)    Hair feels like it is crawling   Morphine Nausea And Vomiting    PCA PUMP/ DRIP -- N/V IV PUSH IN ER  NO PROBLEM PER PT.     Prednisone Nausea And Vomiting   Adhesive [Tape]     Paper tape causes irritation    Erythromycin Hives   Metformin And Related     Diarrhea with IR and XR products   Sulfonamide Derivatives Other (See Comments)    Does not take due to family history     Review of Systems: All systems reviewed and negative except where noted in HPI.   Lab Results  Component Value Date   WBC 8.1 10/07/2020   HGB 14.2 10/07/2020   HCT 43.2 10/07/2020   MCV 83.1 10/07/2020    PLT 360 10/07/2020   Lab Results  Component Value Date   CREATININE 0.82 10/07/2020   BUN 11 10/07/2020   NA 137 10/07/2020   K 3.8 10/07/2020   CL 102 10/07/2020   CO2 25 10/07/2020    Lab Results  Component Value Date   ALT 12 09/10/2020   AST 10 09/10/2020   ALKPHOS 129 (H) 09/10/2020   BILITOT 0.3 09/10/2020     Physical Exam: There were no vitals taken for this visit. Patient appeared healthy and in no distress on video   ASSESSMENT AND PLAN: 50 year old female here for reassessment of the following:  Dysphagia /GEJ outflow obstruction GERD Gastroparesis Constipation  Generally she has been doing pretty well in regards to most of her upper tract symptoms and constipation since have last seen her, suspect perhaps her diabetes regimen, Trulicity, was playing a role in some of this.  She is now off Reglan which is good news and her Dexilant is doing a good job controlling her reflux.  Her constipation is well controlled and using Linzess only as needed.  Main problem bothering her continues to be dysphagia.  We performed an EGD with empiric dilation which has not provided relief in the past.  Manometry is consistent with a GJ outflow obstruction.  She has had a response to Botox injection remotely in 2008.  I discussed the treatment options with her and she wanted to proceed with a trial of Botox injection again rather than evaluation at tertiary care facility.  I discussed endoscopy with her, risk benefits of the exam and anesthesia and she agrees to proceed.  She is scheduled to have this done at the hospital next month.  Further recommendations pending the results.  All questions answered  Jolly Mango, MD [facility]

## 2021-01-21 ENCOUNTER — Other Ambulatory Visit: Payer: Self-pay | Admitting: Family Medicine

## 2021-01-21 DIAGNOSIS — Z23 Encounter for immunization: Secondary | ICD-10-CM | POA: Diagnosis not present

## 2021-01-21 DIAGNOSIS — F39 Unspecified mood [affective] disorder: Secondary | ICD-10-CM

## 2021-01-22 ENCOUNTER — Ambulatory Visit (INDEPENDENT_AMBULATORY_CARE_PROVIDER_SITE_OTHER): Payer: Medicaid Other | Admitting: Nurse Practitioner

## 2021-01-22 ENCOUNTER — Other Ambulatory Visit: Payer: Medicaid Other

## 2021-01-22 DIAGNOSIS — E1165 Type 2 diabetes mellitus with hyperglycemia: Secondary | ICD-10-CM

## 2021-01-22 DIAGNOSIS — E785 Hyperlipidemia, unspecified: Secondary | ICD-10-CM

## 2021-01-22 DIAGNOSIS — E1065 Type 1 diabetes mellitus with hyperglycemia: Secondary | ICD-10-CM

## 2021-01-22 DIAGNOSIS — N3000 Acute cystitis without hematuria: Secondary | ICD-10-CM | POA: Diagnosis not present

## 2021-01-22 DIAGNOSIS — E039 Hypothyroidism, unspecified: Secondary | ICD-10-CM | POA: Diagnosis not present

## 2021-01-22 LAB — BAYER DCA HB A1C WAIVED: HB A1C (BAYER DCA - WAIVED): 6.8 % — ABNORMAL HIGH (ref 4.8–5.6)

## 2021-01-22 MED ORDER — CEPHALEXIN 500 MG PO CAPS: 500.0000 mg | ORAL_CAPSULE | Freq: Two times a day (BID) | ORAL | 0 refills | Status: DC

## 2021-01-22 NOTE — Progress Notes (Signed)
Virtual Visit  Note Due to COVID-19 pandemic this visit was conducted virtually. This visit type was conducted due to national recommendations for restrictions regarding the COVID-19 Pandemic (e.g. social distancing, sheltering in place) in an effort to limit this patient's exposure and mitigate transmission in our community. All issues noted in this document were discussed and addressed.  A physical exam was not performed with this format.  I connected with Judith [name] on 01/22/21 at 1:53 by telephone and verified that I am speaking with the correct person using two identifiers. Judith [name] is currently located at [facility] and no one is currently with her during visit. The provider, Mary-Margaret Hassell Done, FNP is located in their office at time of visit.  I discussed the limitations, risks, security and privacy concerns of performing an evaluation and management service by telephone and the availability of in person appointments. I also discussed with the patient that there may be a patient responsible charge related to this service. The patient expressed understanding and agreed to proceed.   History and Present Illness:   Chief Complaint: Uti  HPI Patient states taht 2 days ago she developed dysuria. She has some pressure in pubic area. She has frequency and urgency with scant amounts of urine at a time.     Review of Systems  Constitutional:  Negative for chills and fever.  Genitourinary:  Positive for dysuria, frequency and urgency. Negative for flank pain and hematuria.    Observations/Objective: Alert and oriented- answers all questions appropriately No distress   Assessment and Plan: Judith [name] in today with chief complaint of Urinary Tract Infection   1. Uncontrolled type 2 diabetes mellitus with hyperglycemia (HCC) - CMP14+EGFR - Bayer DCA Hb A1c Waived - Lipid panel  2. Acquired hypothyroidism - TSH  3. Type 1 diabetes mellitus with hyperglycemia (HCC) - CBC  with Differential/Platelet  4. Hyperlipidemia, unspecified hyperlipidemia type - CBC with Differential/Platelet  Above dx were here because patient came in office for routine labs   5. Acute cystitis without hematuria Take medication as prescribe Cotton underwear Take shower not bath Cranberry juice, yogurt Force fluids AZO over the counter X2 days Culture pending RTO prn  - Urine Culture  Meds ordered this encounter  Medications   cephALEXin (KEFLEX) 500 MG capsule    Sig: Take 1 capsule (500 mg total) by mouth 2 (two) times daily.    Dispense:  14 capsule    Refill:  0    Order Specific Question:   Supervising Provider    Answer:   Caryl Pina A [[id number]]     Follow Up Instructions: Next week for previously scheduled appointment    I discussed the assessment and treatment plan with the patient. The patient was provided an opportunity to ask questions and all were answered. The patient agreed with the plan and demonstrated an understanding of the instructions.   The patient was advised to call back or seek an in-person evaluation if the symptoms worsen or if the condition fails to improve as anticipated.  The above assessment and management plan was discussed with the patient. The patient verbalized understanding of and has agreed to the management plan. Patient is aware to call the clinic if symptoms persist or worsen. Patient is aware when to return to the clinic for a follow-up visit. Patient educated on when it is appropriate to go to the emergency department.   Time call ended:  2:05  I provided 12 minutes of  non  face-to-face time during this encounter.    Mary-Margaret Hassell Done, FNP

## 2021-01-23 ENCOUNTER — Other Ambulatory Visit: Payer: Self-pay | Admitting: Family Medicine

## 2021-01-23 ENCOUNTER — Other Ambulatory Visit: Payer: Self-pay | Admitting: Nurse Practitioner

## 2021-01-23 DIAGNOSIS — F39 Unspecified mood [affective] disorder: Secondary | ICD-10-CM

## 2021-01-23 LAB — CBC WITH DIFFERENTIAL/PLATELET
Basophils Absolute: 0 10*3/uL (ref 0.0–0.2)
Basos: 0 %
EOS (ABSOLUTE): 0 10*3/uL (ref 0.0–0.4)
Eos: 0 %
Hematocrit: 41.7 % (ref 34.0–46.6)
Hemoglobin: 13.7 g/dL (ref 11.1–15.9)
Immature Grans (Abs): 0 10*3/uL (ref 0.0–0.1)
Immature Granulocytes: 0 %
Lymphocytes Absolute: 2.5 10*3/uL (ref 0.7–3.1)
Lymphs: 33 %
MCH: 26.8 pg (ref 26.6–33.0)
MCHC: 32.9 g/dL (ref 31.5–35.7)
MCV: 82 fL (ref 79–97)
Monocytes Absolute: 0.5 10*3/uL (ref 0.1–0.9)
Monocytes: 7 %
Neutrophils Absolute: 4.6 10*3/uL (ref 1.4–7.0)
Neutrophils: 60 %
Platelets: 381 10*3/uL (ref 150–450)
RBC: 5.11 x10E6/uL (ref 3.77–5.28)
RDW: 13.7 % (ref 11.7–15.4)
WBC: 7.7 10*3/uL (ref 3.4–10.8)

## 2021-01-23 LAB — CMP14+EGFR
ALT: 7 IU/L (ref 0–32)
AST: 11 IU/L (ref 0–40)
Albumin/Globulin Ratio: 1.5 (ref 1.2–2.2)
Albumin: 4 g/dL (ref 3.8–4.9)
Alkaline Phosphatase: 116 IU/L (ref 44–121)
BUN/Creatinine Ratio: 16 (ref 9–23)
BUN: 12 mg/dL (ref 6–24)
Bilirubin Total: <0.2 mg/dL (ref 0.0–1.2)
CO2: 25 mmol/L (ref 20–29)
Calcium: 9.1 mg/dL (ref 8.7–10.2)
Chloride: 102 mmol/L (ref 96–106)
Creatinine, Ser: 0.76 mg/dL (ref 0.57–1.00)
Globulin, Total: 2.7 g/dL (ref 1.5–4.5)
Glucose: 101 mg/dL — ABNORMAL HIGH (ref 65–99)
Potassium: 4.3 mmol/L (ref 3.5–5.2)
Sodium: 139 mmol/L (ref 134–144)
Total Protein: 6.7 g/dL (ref 6.0–8.5)
eGFR: 95 mL/min/{1.73_m2} (ref >59)

## 2021-01-23 LAB — LIPID PANEL
Chol/HDL Ratio: 4.8 ratio — ABNORMAL HIGH (ref 0.0–4.4)
Cholesterol, Total: 144 mg/dL (ref 100–199)
HDL: 30 mg/dL — ABNORMAL LOW (ref >39)
LDL Chol Calc (NIH): 92 mg/dL (ref 0–99)
Triglycerides: 120 mg/dL (ref 0–149)
VLDL Cholesterol Cal: 22 mg/dL (ref 5–40)

## 2021-01-23 LAB — TSH: TSH: 0.124 u[IU]/mL — ABNORMAL LOW (ref 0.450–4.500)

## 2021-01-23 MED ORDER — LEVOFLOXACIN 500 MG PO TABS: 500.0000 mg | ORAL_TABLET | Freq: Every day | ORAL | 0 refills | Status: DC

## 2021-01-23 NOTE — Telephone Encounter (Signed)
Why can you not take keflex- noton your chart as allergy on intolerance. That is first line treatment for UTI

## 2021-01-23 NOTE — Telephone Encounter (Signed)
I will send in levaquin, but keflex allergy is not on allergy list. Please have added at next office visit.

## 2021-01-24 ENCOUNTER — Encounter: Payer: Self-pay | Admitting: Family Medicine

## 2021-01-24 ENCOUNTER — Other Ambulatory Visit: Payer: Self-pay | Admitting: Family Medicine

## 2021-01-24 ENCOUNTER — Other Ambulatory Visit: Payer: Self-pay

## 2021-01-24 DIAGNOSIS — E039 Hypothyroidism, unspecified: Secondary | ICD-10-CM

## 2021-01-24 DIAGNOSIS — G8929 Other chronic pain: Secondary | ICD-10-CM

## 2021-01-24 LAB — URINE CULTURE

## 2021-01-24 MED ORDER — IBUPROFEN 600 MG PO TABS: 600.0000 mg | ORAL_TABLET | Freq: Every evening | ORAL | 3 refills | Status: DC | PRN

## 2021-01-24 NOTE — Telephone Encounter (Signed)
Dr. Lajuana Ripple,  Will you review pts labs again. You did document on them but did not discuss the TSH. Not sure if you finished reviewing?

## 2021-01-24 NOTE — Telephone Encounter (Signed)
Pt is aware of how to take Synthroid. Pt messaged back stating that she needs refills of Lamictal and '600mg'$  IBU for should inflammation.   The Lamictal #180 was sent on 9/6. You are not the prescriber of the IBU. Just want to make sure you are ok sending.

## 2021-01-24 NOTE — Telephone Encounter (Signed)
Yes, have her take 1/2 of Synthroid on Sundays and continue 1 tablet all other days.  Recheck TSH Ft4 in 4 weeks.

## 2021-01-27 ENCOUNTER — Other Ambulatory Visit: Payer: Self-pay | Admitting: Family Medicine

## 2021-01-27 ENCOUNTER — Ambulatory Visit: Payer: Self-pay | Admitting: Family Medicine

## 2021-01-29 ENCOUNTER — Other Ambulatory Visit: Payer: Self-pay

## 2021-01-29 ENCOUNTER — Ambulatory Visit (INDEPENDENT_AMBULATORY_CARE_PROVIDER_SITE_OTHER): Payer: Medicaid Other | Admitting: Family Medicine

## 2021-01-29 ENCOUNTER — Encounter: Payer: Self-pay | Admitting: Family Medicine

## 2021-01-29 VITALS — BP 120/74 | HR 101 | Temp 98.1°F | Ht 68.0 in | Wt 302.4 lb

## 2021-01-29 DIAGNOSIS — M25561 Pain in right knee: Secondary | ICD-10-CM

## 2021-01-29 DIAGNOSIS — E1169 Type 2 diabetes mellitus with other specified complication: Secondary | ICD-10-CM

## 2021-01-29 DIAGNOSIS — E785 Hyperlipidemia, unspecified: Secondary | ICD-10-CM

## 2021-01-29 DIAGNOSIS — G8929 Other chronic pain: Secondary | ICD-10-CM

## 2021-01-29 DIAGNOSIS — M5136 Other intervertebral disc degeneration, lumbar region: Secondary | ICD-10-CM

## 2021-01-29 DIAGNOSIS — M25562 Pain in left knee: Secondary | ICD-10-CM | POA: Diagnosis not present

## 2021-01-29 DIAGNOSIS — M5030 Other cervical disc degeneration, unspecified cervical region: Secondary | ICD-10-CM | POA: Diagnosis not present

## 2021-01-29 MED ORDER — ONDANSETRON 8 MG PO TBDP: 4.0000 mg | ORAL_TABLET | Freq: Three times a day (TID) | ORAL | 0 refills | Status: DC | PRN

## 2021-01-29 NOTE — Progress Notes (Signed)
Subjective: CC: DM PCP: Raliegh Ip, DO HPI:Judith [name] is a 50 y.o. female presenting to clinic today for:  1. Type 2 Diabetes with hypertension, hyperlipidemia:  Compliant with all meds.  Continues to gain weight.  She is considered bariatric surgery.  She did have urinary symptoms and was seen for UTI recently.  Her culture however grew negative.  Regardless, she completed the antibiotics and felt better.    Last A1c:  Lab Results  Component Value Date   HGBA1C 6.8 (H) 01/22/2021    Last flu, zoster and/or pneumovax:  Immunization History  Administered Date(s) Administered   Influenza, Seasonal, Injecte, Preservative Fre 02/09/2018   Influenza,inj,Quad PF,6+ Mos 02/14/2017, 02/20/2019   Influenza-Unspecified 02/05/2020   MMR 02/03/1972   Moderna Sars-Covid-2 Vaccination 10/02/2019, 11/01/2019, 04/18/2020, 11/26/2020   PNEUMOCOCCAL CONJUGATE-20 11/26/2020   Pneumococcal Polysaccharide-23 02/20/2019, 11/01/2019   Td 03/03/1985, 06/30/1994   Tdap 12/24/2014, 11/26/2020   Zoster Recombinat (Shingrix) 02/05/2020    ROS: No reports of chest pain shortness of breath (outside of baseline).  She continues to suffer from multiple joint issues and is asking for an addended work note so that she can take more frequent seated breaks.  She reports feeling worn out when she gets home and has been taking ibuprofen at bedtime again because she otherwise wakes up with pain.  She has follow-up with her specialist in November and then again in February for formal work release.   ROS: Per HPI  Allergies  Allergen Reactions   Diphenhydramine Other (See Comments)    Hair feels like it is crawling   Morphine Nausea And Vomiting    PCA PUMP/ DRIP -- N/V IV PUSH IN ER NO PROBLEM PER PT.     Prednisone Nausea And Vomiting   Adhesive [Tape]     Paper tape causes irritation    Erythromycin Hives   Metformin And Related     Diarrhea with IR and XR products   Sulfonamide Derivatives  Other (See Comments)    Does not take due to family history   Past Medical History:  Diagnosis Date   Allergy    Anxiety    Arthritis    Asthma    Bipolar 1 disorder (HCC)    Blood transfusion without reported diagnosis    with child birth 16 yrs ago    Bronchitis, chronic (HCC)    Constipation    COVID-19 virus infection 06/21/2020   DDD (degenerative disc disease), cervical    Depression    Diabetes mellitus without complication (HCC)    Dysphagia    Family history of adverse reaction to anesthesia    mother had a hard time waking up after knee surgery in 2021   Gastroparesis    GERD (gastroesophageal reflux disease)    History of kidney stones    History of MRSA infection    Hyperlipemia    Hypothyroid    Migraines    Pneumonia    Sleep apnea    no cpap     Current Outpatient Medications:    Accu-Chek FastClix Lancets MISC, Test blood sugars daily Dx E11.9, Disp: 102 each, Rfl: 3   albuterol (PROVENTIL) (2.5 MG/3ML) 0.083% nebulizer solution, Take 3 mLs (2.5 mg total) by nebulization every 6 (six) hours as needed for wheezing or shortness of breath. Fill upon patient request, Disp: 75 mL, Rfl: 12   albuterol (VENTOLIN HFA) 108 (90 Base) MCG/ACT inhaler, Inhale 2 puffs into the lungs every 6 (six) hours as  needed for wheezing or shortness of breath. Fill upon patient request, Disp: 8 g, Rfl: 1   baclofen (LIORESAL) 10 MG tablet, TAKE 0.5-1 TABLETS BY MOUTH 3 TIMES DAILY AS NEEDED FOR MUSCLE SPASMS., Disp: 90 tablet, Rfl: 12   Blood Glucose Monitoring Suppl (ACCU-CHEK GUIDE ME) w/Device KIT, Use to check BS BID and as needed. DX: E11.9, Disp: 1 kit, Rfl: 0   budesonide-formoterol (SYMBICORT) 160-4.5 MCG/ACT inhaler, Inhale 2 puffs into the lungs 2 (two) times daily., Disp: 1 each, Rfl: 3   Continuous Blood Gluc Receiver (FREESTYLE LIBRE READER) DEVI, 1 Units by Does not apply route daily. UAD to test BGs daily. Dx E11.40, Disp: 1 each, Rfl: 1   Continuous Blood Gluc Sensor  (FREESTYLE LIBRE 2 SENSOR) MISC, 1 Units by Does not apply route every 14 (fourteen) days., Disp: 6 each, Rfl: 3   dexlansoprazole (DEXILANT) 60 MG capsule, Take 1 capsule (60 mg total) by mouth daily., Disp: 90 capsule, Rfl: 3   diclofenac Sodium (VOLTAREN) 1 % GEL, APPLY 4 GRAMS TOPICALLY 4 (FOUR) TIMES DAILY AS NEEDED (KNEE PAIN)., Disp: 400 g, Rfl: 0   dicyclomine (BENTYL) 20 MG tablet, TAKE 1 TABLET (20 MG TOTAL) BY MOUTH EVERY 6 (SIX) HOURS. (Patient taking differently: Take 20 mg by mouth every 6 (six) hours as needed for spasms (abdominal cramps).), Disp: 360 tablet, Rfl: 0   docusate sodium (COLACE) 100 MG capsule, Take 1 capsule (100 mg total) by mouth 2 (two) times daily., Disp: 60 capsule, Rfl: 0   gabapentin (NEURONTIN) 300 MG capsule, 1 capsule (300 mg total) 3 (three) times daily., Disp: 90 capsule, Rfl: 3   glucose blood (ACCU-CHEK GUIDE) test strip, Test blood sugars daily Dx E11.9, Disp: 100 strip, Rfl: 3   hydrOXYzine (ATARAX/VISTARIL) 25 MG tablet, TAKE 1 TABLET BY MOUTH THREE TIMES A DAY (Patient taking differently: Take 25 mg by mouth every 8 (eight) hours as needed for itching or anxiety.), Disp: 270 tablet, Rfl: 1   ibuprofen (ADVIL) 600 MG tablet, Take 1 tablet (600 mg total) by mouth at bedtime as needed., Disp: 30 tablet, Rfl: 3   Insulin Pen Needle 31G X 5 MM MISC, 1 Units by Does not apply route daily., Disp: 100 each, Rfl: 5   lamoTRIgine (LAMICTAL) 150 MG tablet, TAKE 1 TABLET BY MOUTH TWICE A DAY, Disp: 180 tablet, Rfl: 0   levofloxacin (LEVAQUIN) 500 MG tablet, Take 1 tablet (500 mg total) by mouth daily., Disp: 5 tablet, Rfl: 0   levothyroxine (SYNTHROID) 125 MCG tablet, Take 1 tablet (125 mcg total) by mouth daily. (Patient taking differently: Take 125 mcg by mouth at bedtime.), Disp: 90 tablet, Rfl: 1   linaclotide (LINZESS) 72 MCG capsule, Take 1 capsule (72 mcg total) by mouth daily before breakfast. (Patient taking differently: Take 72 mcg by mouth daily as needed  (constipation).), Disp: 30 capsule, Rfl: 2   lisdexamfetamine (VYVANSE) 70 MG capsule, Take 1 capsule (70 mg total) by mouth daily., Disp: 30 capsule, Rfl: 0   lisdexamfetamine (VYVANSE) 70 MG capsule, Take 1 capsule (70 mg total) by mouth daily., Disp: 30 capsule, Rfl: 0   lisdexamfetamine (VYVANSE) 70 MG capsule, Take 1 capsule (70 mg total) by mouth daily., Disp: 30 capsule, Rfl: 0   medroxyPROGESTERone (DEPO-PROVERA) 150 MG/ML injection, Inject 1 mL (150 mg total) into the muscle every 3 (three) months., Disp: 1 mL, Rfl: 3   Mepolizumab (NUCALA) 100 MG/ML SOAJ, Inject 1 mL (100 mg total) into the skin every 28 (  twenty-eight) days. Deliver to patient's home, Disp: 3 mL, Rfl: 1   montelukast (SINGULAIR) 10 MG tablet, TAKE ONE TAB BY MOUTH AT BEDTIME (Patient taking differently: Take 10 mg by mouth at bedtime.), Disp: 90 tablet, Rfl: 3   nystatin cream (MYCOSTATIN), Apply 1 application topically 2 (two) times daily. x2 weeks (Patient taking differently: Apply 1 application topically 2 (two) times daily as needed (rash).), Disp: 60 g, Rfl: 0   ondansetron (ZOFRAN) 4 MG tablet, Take 1 tablet (4 mg total) by mouth every 8 (eight) hours as needed for nausea or vomiting., Disp: 20 tablet, Rfl: 0   pioglitazone (ACTOS) 45 MG tablet, Take 1 tablet (45 mg total) by mouth daily., Disp: 90 tablet, Rfl: 1   promethazine (PHENERGAN) 25 MG tablet, Take 1 tablet (25 mg total) by mouth every 6 (six) hours as needed for nausea or vomiting., Disp: 30 tablet, Rfl: 0   rosuvastatin (CRESTOR) 40 MG tablet, Take 1 tablet (40 mg total) by mouth daily. (Patient taking differently: Take 40 mg by mouth at bedtime.), Disp: 90 tablet, Rfl: 3   Semaglutide (RYBELSUS) 14 MG TABS, Take 1 tablet by mouth daily before breakfast. (Patient taking differently: Take 14 mg by mouth daily before breakfast.), Disp: 30 tablet, Rfl: prn   sertraline (ZOLOFT) 100 MG tablet, Take 2 tablets (200 mg total) by mouth daily. (Patient taking  differently: Take 200 mg by mouth at bedtime.), Disp: 180 tablet, Rfl: 3   TRESIBA FLEXTOUCH 200 UNIT/ML FlexTouch Pen, INJECT 66 UNITS INTO THE SKIN AT BEDTIME., Disp: 3 mL, Rfl: 3  Current Facility-Administered Medications:    medroxyPROGESTERone (DEPO-PROVERA) injection 150 mg, 150 mg, Intramuscular, Q90 days, Chardai Gangemi M, DO, 150 mg at 09/10/20 8119 Social History   Socioeconomic History   Marital status: Divorced    Spouse name: Not on file   Number of children: Not on file   Years of education: Not on file   Highest education level: Not on file  Occupational History   Not on file  Tobacco Use   Smoking status: Former    Packs/day: 1.00    Years: 5.00    Pack years: 5.00    Types: Cigarettes    Quit date: 04/04/1993    Years since quitting: 27.8   Smokeless tobacco: Never   Tobacco comments:    smoked when went out with friends/social smoker. pack would last a week.  Vaping Use   Vaping Use: Former  Substance and Sexual Activity   Alcohol use: No   Drug use: No   Sexual activity: Yes    Birth control/protection: I.U.D.  Other Topics Concern   Not on file  Social History Narrative   Not on file   Social Determinants of Health   Financial Resource Strain: Not on file  Food Insecurity: Not on file  Transportation Needs: Not on file  Physical Activity: Not on file  Stress: Not on file  Social Connections: Not on file  Intimate Partner Violence: Not on file   Family History  Problem Relation Age of Onset   Mental illness Other    Diabetes Other    Healthy Mother    Cancer Father    Non-Hodgkin's lymphoma Father    Breast cancer Paternal Grandmother    Lung cancer Paternal Grandmother    Esophageal cancer Other    Colon cancer Neg Hx    Stomach cancer Neg Hx    Pancreatic cancer Neg Hx    Colon polyps Neg Hx  Objective: Office vital signs reviewed. BP 120/74   Pulse (!) 101   Temp 98.1 F (36.7 C)   Ht 5' 8" (1.727 m)   Wt (!) 302 lb  6.4 oz (137.2 kg)   SpO2 99%   BMI 45.98 kg/m   Physical Examination:  General: Awake, alert, morbid obesity, No acute distress HEENT: Normal; sclera white Cardio: regular rate and rhythm, S1S2 heard, no murmurs appreciated Pulm: clear to auscultation bilaterally, no wheezes, rhonchi or rales; normal work of breathing on room air MSK: Well-healed postsurgical scars noted to bilateral knees.  No gross joint deformity, erythema or soft tissue swelling.  Patient is ambulating independently  Assessment/ Plan: 50 y.o. female    Type 2 diabetes mellitus with other specified complication, without long-term current use of insulin ([place]) - Plan: Amb Referral to Bariatric Surgery  Hyperlipidemia associated with type 2 diabetes mellitus ([place]) - Plan: Amb Referral to Bariatric Surgery  Morbid obesity ([place]) - Plan: Amb Referral to Bariatric Surgery  DDD (degenerative disc disease), cervical - Plan: Amb Referral to Bariatric Surgery  DDD (degenerative disc disease), lumbar - Plan: Amb Referral to Bariatric Surgery  Chronic pain of both knees - Plan: Amb Referral to Bariatric Surgery  Sugar now at goal.  No changes.  Unfortunately continues to gain weight and suffer from morbid obesity despite treatment with Rybelsus.  She was actively drinking a soda today and I again reiterated need for eliminating these unnecessary beverages.  She requested referral to bariatric surgery today for discussion I have placed this.  I have written a new note with work restrictions.  Further restrictions per orthopedics  No orders of the defined types were placed in this encounter.  No orders of the defined types were placed in this encounter.    Janora Norlander, DO [place] [phone]

## 2021-01-29 NOTE — Patient Instructions (Signed)
Labs in October for thyroid.

## 2021-01-29 NOTE — Progress Notes (Signed)
Attempted to obtain medical history via telephone, unable to reach at this time. I left a voicemail to return pre surgical testing department's phone call.  

## 2021-02-03 NOTE — Anesthesia Preprocedure Evaluation (Addendum)
Anesthesia Evaluation  Patient identified by MRN, date of birth, ID band Patient awake    Reviewed: Allergy & Precautions, NPO status , [name] Chart, lab work & pertinent test results  History of Anesthesia Complications Negative for: history of anesthetic complications  Airway Mallampati: II  TM Distance: >3 FB Neck ROM: Full    Dental  (+) Dental Advisory Given, Edentulous Upper, Partial Lower   Pulmonary asthma , sleep apnea , former smoker,    Pulmonary exam normal        Cardiovascular hypertension, Normal cardiovascular exam     Neuro/Psych  Headaches, PSYCHIATRIC DISORDERS Anxiety Depression Bipolar Disorder    GI/Hepatic Neg liver ROS, GERD  Medicated and Controlled,  Endo/Other  diabetes, Type 2, Insulin DependentHypothyroidism Morbid obesity  Renal/GU negative Renal ROS     Musculoskeletal  (+) Arthritis ,   Abdominal   Peds  Hematology negative hematology ROS (+)   Anesthesia Other Findings   Reproductive/Obstetrics                            Anesthesia Physical Anesthesia Plan  ASA: 3  Anesthesia Plan: MAC   Post-op Pain Management:    Induction:   PONV Risk Score and Plan: 2 and Propofol infusion and Treatment may vary due to age or medical condition  Airway Management Planned: Nasal Cannula and Natural Airway  Additional Equipment: None  Intra-op Plan:   Post-operative Plan:   Informed Consent: I have reviewed the patients History and Physical, chart, labs and discussed the procedure including the risks, benefits and alternatives for the proposed anesthesia with the patient or authorized representative who has indicated his/her understanding and acceptance.       Plan Discussed with: CRNA and Anesthesiologist  Anesthesia Plan Comments:        Anesthesia Quick Evaluation

## 2021-02-04 ENCOUNTER — Ambulatory Visit (HOSPITAL_COMMUNITY)
Admission: RE | Admit: 2021-02-04 | Discharge: 2021-02-04 | Disposition: A | Discharge status: Home / Self Care | Payer: Medicaid Other | Attending: Gastroenterology | Admitting: Gastroenterology

## 2021-02-04 ENCOUNTER — Other Ambulatory Visit: Payer: Self-pay

## 2021-02-04 ENCOUNTER — Ambulatory Visit (HOSPITAL_COMMUNITY): Payer: Medicaid Other | Admitting: Certified Registered Nurse Anesthetist

## 2021-02-04 ENCOUNTER — Encounter (HOSPITAL_COMMUNITY)
Admission: RE | Disposition: A | Discharge status: Home / Self Care | Payer: Self-pay | Source: Home / Self Care | Attending: Gastroenterology

## 2021-02-04 DIAGNOSIS — K3184 Gastroparesis: Secondary | ICD-10-CM | POA: Insufficient documentation

## 2021-02-04 DIAGNOSIS — Z6841 Body Mass Index (BMI) 40.0 and over, adult: Secondary | ICD-10-CM | POA: Insufficient documentation

## 2021-02-04 DIAGNOSIS — Z87891 Personal history of nicotine dependence: Secondary | ICD-10-CM | POA: Diagnosis not present

## 2021-02-04 DIAGNOSIS — E1143 Type 2 diabetes mellitus with diabetic autonomic (poly)neuropathy: Secondary | ICD-10-CM | POA: Insufficient documentation

## 2021-02-04 DIAGNOSIS — R1310 Dysphagia, unspecified: Secondary | ICD-10-CM | POA: Insufficient documentation

## 2021-02-04 DIAGNOSIS — Z800 Family history of malignant neoplasm of digestive organs: Secondary | ICD-10-CM | POA: Diagnosis not present

## 2021-02-04 DIAGNOSIS — Z793 Long term (current) use of hormonal contraceptives: Secondary | ICD-10-CM | POA: Insufficient documentation

## 2021-02-04 DIAGNOSIS — Z791 Long term (current) use of non-steroidal anti-inflammatories (NSAID): Secondary | ICD-10-CM | POA: Insufficient documentation

## 2021-02-04 DIAGNOSIS — J45909 Unspecified asthma, uncomplicated: Secondary | ICD-10-CM | POA: Insufficient documentation

## 2021-02-04 DIAGNOSIS — Z881 Allergy status to other antibiotic agents status: Secondary | ICD-10-CM | POA: Diagnosis not present

## 2021-02-04 DIAGNOSIS — K219 Gastro-esophageal reflux disease without esophagitis: Secondary | ICD-10-CM | POA: Insufficient documentation

## 2021-02-04 DIAGNOSIS — K5900 Constipation, unspecified: Secondary | ICD-10-CM | POA: Diagnosis not present

## 2021-02-04 DIAGNOSIS — Z8719 Personal history of other diseases of the digestive system: Secondary | ICD-10-CM | POA: Insufficient documentation

## 2021-02-04 DIAGNOSIS — K222 Esophageal obstruction: Secondary | ICD-10-CM

## 2021-02-04 DIAGNOSIS — E785 Hyperlipidemia, unspecified: Secondary | ICD-10-CM | POA: Diagnosis not present

## 2021-02-04 DIAGNOSIS — Z818 Family history of other mental and behavioral disorders: Secondary | ICD-10-CM | POA: Insufficient documentation

## 2021-02-04 DIAGNOSIS — Z803 Family history of malignant neoplasm of breast: Secondary | ICD-10-CM | POA: Diagnosis not present

## 2021-02-04 DIAGNOSIS — Z8616 Personal history of COVID-19: Secondary | ICD-10-CM | POA: Insufficient documentation

## 2021-02-04 DIAGNOSIS — Z833 Family history of diabetes mellitus: Secondary | ICD-10-CM | POA: Diagnosis not present

## 2021-02-04 DIAGNOSIS — Z807 Family history of other malignant neoplasms of lymphoid, hematopoietic and related tissues: Secondary | ICD-10-CM | POA: Diagnosis not present

## 2021-02-04 DIAGNOSIS — Z794 Long term (current) use of insulin: Secondary | ICD-10-CM | POA: Diagnosis not present

## 2021-02-04 DIAGNOSIS — Z801 Family history of malignant neoplasm of trachea, bronchus and lung: Secondary | ICD-10-CM | POA: Diagnosis not present

## 2021-02-04 DIAGNOSIS — K449 Diaphragmatic hernia without obstruction or gangrene: Secondary | ICD-10-CM | POA: Diagnosis not present

## 2021-02-04 DIAGNOSIS — Z7951 Long term (current) use of inhaled steroids: Secondary | ICD-10-CM | POA: Diagnosis not present

## 2021-02-04 DIAGNOSIS — K317 Polyp of stomach and duodenum: Secondary | ICD-10-CM | POA: Insufficient documentation

## 2021-02-04 DIAGNOSIS — Z888 Allergy status to other drugs, medicaments and biological substances status: Secondary | ICD-10-CM | POA: Insufficient documentation

## 2021-02-04 DIAGNOSIS — Z885 Allergy status to narcotic agent status: Secondary | ICD-10-CM | POA: Diagnosis not present

## 2021-02-04 DIAGNOSIS — Z882 Allergy status to sulfonamides status: Secondary | ICD-10-CM | POA: Insufficient documentation

## 2021-02-04 DIAGNOSIS — Z79899 Other long term (current) drug therapy: Secondary | ICD-10-CM | POA: Insufficient documentation

## 2021-02-04 DIAGNOSIS — K259 Gastric ulcer, unspecified as acute or chronic, without hemorrhage or perforation: Secondary | ICD-10-CM | POA: Diagnosis not present

## 2021-02-04 DIAGNOSIS — K2970 Gastritis, unspecified, without bleeding: Secondary | ICD-10-CM | POA: Diagnosis not present

## 2021-02-04 HISTORY — PX: BOTOX INJECTION: SHX5754

## 2021-02-04 HISTORY — PX: ESOPHAGOGASTRODUODENOSCOPY (EGD) WITH PROPOFOL: SHX5813

## 2021-02-04 HISTORY — PX: BIOPSY: SHX5522

## 2021-02-04 HISTORY — PX: SCLEROTHERAPY: SHX6841

## 2021-02-04 LAB — GLUCOSE, CAPILLARY: Glucose-Capillary: 131 mg/dL — ABNORMAL HIGH (ref 70–99)

## 2021-02-04 SURGERY — ESOPHAGOGASTRODUODENOSCOPY (EGD) WITH PROPOFOL
Anesthesia: Monitor Anesthesia Care

## 2021-02-04 MED ORDER — SODIUM CHLORIDE (PF) 0.9 % IJ SOLN
INTRAMUSCULAR | Status: AC
  Filled 2021-02-04: qty 10

## 2021-02-04 MED ORDER — PROPOFOL 500 MG/50ML IV EMUL
INTRAVENOUS | Status: DC | PRN
  Administered 2021-02-04: 100 ug/kg/min via INTRAVENOUS

## 2021-02-04 MED ORDER — ONABOTULINUMTOXINA 100 UNITS IJ SOLR
INTRAMUSCULAR | Status: AC
  Filled 2021-02-04: qty 100

## 2021-02-04 MED ORDER — SODIUM CHLORIDE 0.9 % IV SOLN: INTRAVENOUS | Status: DC

## 2021-02-04 MED ORDER — LACTATED RINGERS IV SOLN
INTRAVENOUS | Status: DC
  Administered 2021-02-04: 1000 mL via INTRAVENOUS

## 2021-02-04 MED ORDER — ONDANSETRON HCL 4 MG/2ML IJ SOLN
INTRAMUSCULAR | Status: DC | PRN
  Administered 2021-02-04: 4 mg via INTRAVENOUS

## 2021-02-04 MED ORDER — PROPOFOL 10 MG/ML IV BOLUS
INTRAVENOUS | Status: DC | PRN
  Administered 2021-02-04: 30 mg via INTRAVENOUS

## 2021-02-04 MED ORDER — PROPOFOL 1000 MG/100ML IV EMUL
INTRAVENOUS | Status: AC
  Filled 2021-02-04: qty 100

## 2021-02-04 MED ORDER — SODIUM CHLORIDE (PF) 0.9 % IJ SOLN
INTRAMUSCULAR | Status: DC | PRN
  Administered 2021-02-04: 4 mL via SUBMUCOSAL

## 2021-02-04 MED ORDER — LIDOCAINE 2% (20 MG/ML) 5 ML SYRINGE
INTRAMUSCULAR | Status: DC | PRN
  Administered 2021-02-04: 100 mg via INTRAVENOUS

## 2021-02-04 SURGICAL SUPPLY — 15 items

## 2021-02-04 NOTE — Op Note (Signed)
[facility] Patient Name: Judith [name] Procedure Date: 02/04/2021 MRN: [id number] Attending MD: Carlota Raspberry. 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 Prior empiric dilation                            not helpful. Remote Botox injection several years                            ago helped. At hospital for Botox injection for                            therapy given persistent symptoms. Providers:                Carlota Raspberry. Havery Moros, MD, Doristine Johns, RN,                            Jeanella Cara, RN, Cherylynn Ridges,                            Technician, Claudell Kyle, Technician, Christell Faith,                            CRNA Referring MD:              Medicines:                Monitored Anesthesia Care Complications:            No immediate complications. Estimated blood loss:                            Minimal. Estimated Blood Loss:     Estimated blood loss was minimal. Procedure:                Pre-Anesthesia Assessment:                           - Prior to the procedure, a History and Physical                            was performed, and patient medications and                            allergies were reviewed. The patient's tolerance of                            previous anesthesia was also reviewed. The risks                            and benefits of the procedure and the  sedation                            options and risks were discussed with the patient.                            All questions were answered, and informed consent                            was obtained. Prior Anticoagulants: The patient has                            taken no previous anticoagulant or antiplatelet                            agents. ASA Grade Assessment:  III - A patient with                            severe systemic disease. After reviewing the risks                            and benefits, the patient was deemed in                            satisfactory condition to undergo the procedure.                           After obtaining informed consent, the endoscope was                            passed under direct vision. Throughout the                            procedure, the patient's blood pressure, pulse, and                            oxygen saturations were monitored continuously. The                            [id number]-[id number] ([id number]) Olympus endoscope was introduced                            through the mouth, and advanced to the second part                            of duodenum. The upper GI endoscopy was                            accomplished without difficulty. The patient                            tolerated the procedure well. Scope In: Scope Out: Findings:      Esophagogastric landmarks were identified: the Z-line was found at 38  cm, the gastroesophageal junction was found at 38 cm and the upper       extent of the gastric folds was found at 39 cm from the incisors.      A 1 cm hiatal hernia was present.      The exam of the esophagus was otherwise normal.      1cm above the Z-line, 4 quadrant injections were successfully performed       with 25 units of botulinum toxin each (total 100 units botulinum toxin       given).      A single 4 to 5 mm sessile polyp was found in the prepyloric region of       the stomach, benign appearing. Biopsies were taken with a cold forceps       for histology to ensure no adenomatous change.      The exam of the stomach was otherwise normal.      The duodenal bulb and second portion of the duodenum were normal. Impression:               - Esophagogastric landmarks identified.                           - 1 cm hiatal hernia.                           - Successful Botox injection 1cm above  the Z-line                            (total 100 units delivered)                           - A single benign appearing gastric polyp. Biopsied.                           - Normal stomach otherwise                           - Normal duodenal bulb and second portion of the                            duodenum. Moderate Sedation:      No moderate sedation, case performed with MAC Recommendation:           - Patient has a contact number available for                            emergencies. The signs and symptoms of potential                            delayed complications were discussed with the                            patient. Return to normal activities tomorrow.                            Written discharge instructions were provided to the  patient.                           - Advance diet as tolerated.                           - Continue present medications.                           - Await pathology results and course post Botox                            injection Procedure Code(s):        --- Professional ---                           [phone], 50, Esophagogastroduodenoscopy, flexible,                            transoral; with directed submucosal injection(s),                            any substance                           43239, Esophagogastroduodenoscopy, flexible,                            transoral; with biopsy, single or multiple Diagnosis Code(s):        --- Professional ---                           K44.9, Diaphragmatic hernia without obstruction or                            gangrene                           K31.7, Polyp of stomach and duodenum                           R13.10, Dysphagia, unspecified CPT copyright 2019 American Medical Association. All rights reserved. The codes documented in this report are preliminary and upon coder review may  be revised to meet current compliance requirements. Remo Lipps P. Havery Moros, MD 02/04/2021  7:53:59 AM This report has been signed electronically. Number of Addenda: 0

## 2021-02-04 NOTE — Anesthesia Postprocedure Evaluation (Signed)
Anesthesia Post Note  Patient: Judith [name]  Procedure(s) Performed: ESOPHAGOGASTRODUODENOSCOPY (EGD) WITH PROPOFOL BOTOX INJECTION BIOPSY SCLEROTHERAPY     Patient location during evaluation: PACU Anesthesia Type: MAC Level of consciousness: awake and alert Pain management: pain level controlled Vital Signs Assessment: post-procedure vital signs reviewed and stable Respiratory status: spontaneous breathing, nonlabored ventilation and respiratory function stable Cardiovascular status: stable and blood pressure returned to baseline Anesthetic complications: no   No notable events documented.  Last Vitals:  Vitals:   02/04/21 0803 02/04/21 0813  BP: (!) 144/81 (!) 111/58  Pulse: 84 77  Resp: 16 14  Temp:    SpO2: 98% 99%    Last Pain:  Vitals:   02/04/21 0813  TempSrc:   PainSc: 0-No pain                 Audry Pili

## 2021-02-04 NOTE — Discharge Instructions (Signed)
YOU HAD AN ENDOSCOPIC PROCEDURE TODAY: Refer to the procedure report and other information in the discharge instructions given to you for any specific questions about what was found during the examination. If this information does not answer your questions, please call [place] office at [phone] to clarify.   YOU SHOULD EXPECT: Some feelings of bloating in the abdomen. Passage of more gas than usual. Walking can help get rid of the air that was put into your GI tract during the procedure and reduce the bloating. If you had a lower endoscopy (such as a colonoscopy or flexible sigmoidoscopy) you may notice spotting of blood in your stool or on the toilet paper. Some abdominal soreness may be present for a day or two, also.  DIET: Your first meal following the procedure should be a light meal and then it is ok to progress to your normal diet. A half-sandwich or bowl of soup is an example of a good first meal. Heavy or fried foods are harder to digest and may make you feel nauseous or bloated. Drink plenty of fluids but you should avoid alcoholic beverages for 24 hours. If you had a esophageal dilation, please see attached instructions for diet.    ACTIVITY: Your care partner should take you home directly after the procedure. You should plan to take it easy, moving slowly for the rest of the day. You can resume normal activity the day after the procedure however YOU SHOULD NOT DRIVE, use power tools, machinery or perform tasks that involve climbing or major physical exertion for 24 hours (because of the sedation medicines used during the test).   SYMPTOMS TO REPORT IMMEDIATELY: A gastroenterologist can be reached at any hour. Please call [phone]  for any of the following symptoms:   Following upper endoscopy (EGD, EUS, ERCP, esophageal dilation) Vomiting of blood or coffee ground material  New, significant abdominal pain  New, significant chest pain or pain under the shoulder blades  Painful or  persistently difficult swallowing  New shortness of breath  Black, tarry-looking or red, bloody stools  FOLLOW UP:  If any biopsies were taken you will be contacted by phone or by letter within the next 1-3 weeks. Call [phone]  if you have not heard about the biopsies in 3 weeks.  Please also call with any specific questions about appointments or follow up tests.  

## 2021-02-04 NOTE — Interval H&P Note (Signed)
History and Physical Interval Note: No interval changes since the last visit on 01/13/21, patient feels the same. She is at the hospital for EGD with Botox injection of the LES for GEJ outflow obstruction. She states her asthma is well controlled and denies any cardiopulmonary symptoms at this time. I have discussed the risks / benefits of EGD / Botox injection and anesthesia with her and she wishes to  proceed. Further recommendations pending the results. All questions answered.   Exam is normal as outlined: Gen - AAOX3, pleasant CV - RRR Pulm CTA B Abd - soft NTND, protuberant    02/04/2021 7:19 AM  Judith [name]  has presented today for surgery, with the diagnosis of EGJ outflow obstruction, dysphagia.  The various methods of treatment have been discussed with the patient and family. After consideration of risks, benefits and other options for treatment, the patient has consented to  Procedure(s): ESOPHAGOGASTRODUODENOSCOPY (EGD) WITH PROPOFOL (N/A) BOTOX INJECTION (N/A) as a surgical intervention.  The patient's history has been reviewed, patient examined, no change in status, stable for surgery.  I have reviewed the patient's chart and labs.  Questions were answered to the patient's satisfaction.     [place]

## 2021-02-04 NOTE — Transfer of Care (Signed)
Immediate Anesthesia Transfer of Care Note  Patient: Judith [name]  Procedure(s) Performed: ESOPHAGOGASTRODUODENOSCOPY (EGD) WITH PROPOFOL BOTOX INJECTION BIOPSY SCLEROTHERAPY  Patient Location: PACU and Endoscopy Unit  Anesthesia Type:MAC  Level of Consciousness: awake, drowsy and patient cooperative  Airway & Oxygen Therapy: Patient Spontanous Breathing and Patient connected to face mask oxygen  Post-op Assessment: Report given to RN and Post -op Vital signs reviewed and stable  Post vital signs: Reviewed and stable  Last Vitals:  Vitals Value Taken Time  BP    Temp    Pulse    Resp    SpO2      Last Pain:  Vitals:   02/04/21 0636  TempSrc: Oral  PainSc: 0-No pain         Complications: No notable events documented.

## 2021-02-04 NOTE — Anesthesia Procedure Notes (Signed)
Procedure Name: MAC Date/Time: 02/04/2021 7:29 AM Performed by: West Pugh, CRNA Pre-anesthesia Checklist: Patient identified, Emergency Drugs available, Suction available, Patient being monitored and Timeout performed Patient Re-evaluated:Patient Re-evaluated prior to induction Oxygen Delivery Method: Simple face mask Preoxygenation: Pre-oxygenation with 100% oxygen Placement Confirmation: positive ETCO2 Dental Injury: Teeth and Oropharynx as per pre-operative assessment

## 2021-02-05 ENCOUNTER — Encounter (HOSPITAL_COMMUNITY): Payer: Self-pay | Admitting: Gastroenterology

## 2021-02-05 LAB — SURGICAL PATHOLOGY

## 2021-02-10 ENCOUNTER — Other Ambulatory Visit (HOSPITAL_COMMUNITY): Payer: Self-pay

## 2021-02-12 ENCOUNTER — Ambulatory Visit
Admission: RE | Admit: 2021-02-12 | Discharge: 2021-02-12 | Disposition: A | Discharge status: Home / Self Care | Payer: Medicaid Other | Source: Ambulatory Visit | Attending: Family Medicine | Admitting: Family Medicine

## 2021-02-12 ENCOUNTER — Other Ambulatory Visit (HOSPITAL_COMMUNITY): Payer: Self-pay

## 2021-02-12 ENCOUNTER — Other Ambulatory Visit: Payer: Self-pay

## 2021-02-12 DIAGNOSIS — Z1231 Encounter for screening mammogram for malignant neoplasm of breast: Secondary | ICD-10-CM

## 2021-02-13 ENCOUNTER — Ambulatory Visit (INDEPENDENT_AMBULATORY_CARE_PROVIDER_SITE_OTHER): Payer: Medicaid Other

## 2021-02-13 DIAGNOSIS — Z3042 Encounter for surveillance of injectable contraceptive: Secondary | ICD-10-CM | POA: Diagnosis not present

## 2021-02-13 NOTE — Progress Notes (Signed)
Medroxyprogesterone injection given to right upper outer quadrant.  Patient tolerated well. 

## 2021-02-14 ENCOUNTER — Other Ambulatory Visit (HOSPITAL_COMMUNITY): Payer: Self-pay

## 2021-02-16 ENCOUNTER — Other Ambulatory Visit: Payer: Self-pay | Admitting: Family Medicine

## 2021-02-16 DIAGNOSIS — E109 Type 1 diabetes mellitus without complications: Secondary | ICD-10-CM

## 2021-02-17 ENCOUNTER — Other Ambulatory Visit (HOSPITAL_COMMUNITY): Payer: Self-pay

## 2021-03-18 ENCOUNTER — Other Ambulatory Visit (HOSPITAL_COMMUNITY): Payer: Self-pay

## 2021-03-19 ENCOUNTER — Other Ambulatory Visit (HOSPITAL_COMMUNITY): Payer: Self-pay

## 2021-03-19 ENCOUNTER — Other Ambulatory Visit: Payer: Self-pay | Admitting: Family Medicine

## 2021-03-21 ENCOUNTER — Telehealth: Payer: Self-pay | Admitting: Pharmacist

## 2021-03-21 NOTE — Telephone Encounter (Signed)
Patient last seen by Dr. Silas Flood on 09/29/20 Since that visit, patient started Nucala on 10/29/20 and has not been since starting.  Will need followup appt for Nucala PAP renewal for 2023  Knox Saliva, PharmD, MPH, BCPS Clinical Pharmacist (Rheumatology and Pulmonology)

## 2021-03-24 NOTE — Telephone Encounter (Signed)
Patient scheduled for 12/6 at 67 with Dr. Alden Server further needed at this time.

## 2021-03-26 ENCOUNTER — Telehealth: Payer: Self-pay | Admitting: Pharmacist

## 2021-03-26 MED ORDER — FREESTYLE LIBRE 3 SENSOR MISC: 1.0000 | 11 refills | Status: DC

## 2021-03-26 NOTE — Telephone Encounter (Signed)
Changing to [place] 3 if insurance allows

## 2021-03-28 NOTE — Addendum Note (Signed)
Addended by: Lottie Dawson D on: 03/28/2021 10:14 AM   Modules accepted: Orders

## 2021-04-02 ENCOUNTER — Other Ambulatory Visit: Payer: Self-pay | Admitting: Family Medicine

## 2021-04-14 ENCOUNTER — Other Ambulatory Visit (HOSPITAL_COMMUNITY): Payer: Self-pay

## 2021-04-15 ENCOUNTER — Other Ambulatory Visit (HOSPITAL_COMMUNITY): Payer: Self-pay

## 2021-04-15 ENCOUNTER — Emergency Department: Admit: 2021-04-15 | Payer: Self-pay

## 2021-04-15 ENCOUNTER — Other Ambulatory Visit: Payer: Self-pay

## 2021-04-15 ENCOUNTER — Emergency Department
Admission: EM | Admit: 2021-04-15 | Discharge: 2021-04-15 | Disposition: A | Discharge status: Home / Self Care | Payer: Medicaid Other | Source: Home / Self Care

## 2021-04-15 DIAGNOSIS — R102 Pelvic and perineal pain: Secondary | ICD-10-CM | POA: Diagnosis not present

## 2021-04-15 DIAGNOSIS — M62838 Other muscle spasm: Secondary | ICD-10-CM | POA: Diagnosis not present

## 2021-04-15 DIAGNOSIS — R109 Unspecified abdominal pain: Secondary | ICD-10-CM | POA: Diagnosis not present

## 2021-04-15 DIAGNOSIS — R10813 Right lower quadrant abdominal tenderness: Secondary | ICD-10-CM | POA: Diagnosis not present

## 2021-04-15 DIAGNOSIS — R110 Nausea: Secondary | ICD-10-CM | POA: Diagnosis not present

## 2021-04-15 LAB — POCT URINALYSIS DIP (MANUAL ENTRY)
Bilirubin, UA: NEGATIVE
Blood, UA: NEGATIVE
Glucose, UA: NEGATIVE mg/dL
Ketones, POC UA: NEGATIVE mg/dL
Leukocytes, UA: NEGATIVE
Nitrite, UA: NEGATIVE
Spec Grav, UA: >=1.03 — AB (ref 1.010–1.025)
Urobilinogen, UA: 0.2 E.U./dL
pH, UA: 6 (ref 5.0–8.0)

## 2021-04-15 MED ORDER — ONDANSETRON 4 MG PO TBDP
4.0000 mg | ORAL_TABLET | Freq: Once | ORAL | Status: AC
  Administered 2021-04-15: 4 mg via ORAL

## 2021-04-15 NOTE — ED Provider Notes (Signed)
[name]    CSN: [id number] Arrival date & time: 04/15/21  1635      History   Chief Complaint Chief Complaint  Patient presents with   Back Pain   Pelvic Pain    HPI Judith [name] is a 50 y.o. female.   HPI 50 year old female presents with back pain and pelvic pain that began yesterday.  Patient reports has been taking pain medication and muscle relaxers from previous prescriptions.  Patient reports history of kidney stones.  PMH also significant for lumbar back pain with radiculopathy affecting lower extremity, acute bilateral thoracic back pain, acute bilateral low back pain with sciatica.  Past Medical History:  Diagnosis Date   Allergy    Anxiety    Arthritis    Asthma    Bipolar 1 disorder ([place])    Blood transfusion without reported diagnosis    with child birth 24 yrs ago    Bronchitis, chronic ([place])    Constipation    COVID-19 virus infection 06/21/2020   DDD (degenerative disc disease), cervical    Depression    Diabetes mellitus without complication ([place])    Dysphagia    Family history of adverse reaction to anesthesia    mother had a hard time waking up after knee surgery in 2021   Gastroparesis    GERD (gastroesophageal reflux disease)    History of kidney stones    History of MRSA infection    Hyperlipemia    Hypothyroid    Migraines    Pneumonia    Sleep apnea    no cpap     Patient Active Problem List   Diagnosis Date Noted   Esophagogastric junction outflow obstruction    Gastric polyp    Cervical myelopathy ([place]) 10/09/2020   Mild intermittent asthma with acute exacerbation 10/04/2020   Dysphagia    Patellofemoral pain syndrome of both knees 04/19/2020   Tinnitus of right ear 03/15/2020   Closed fracture of one rib of left side 03/15/2020   Concussion with no loss of consciousness 02/05/2020   Spondylosis of cervical spine 02/05/2020   Acute bilateral thoracic back pain 02/05/2020   Acute bilateral low back pain without  sciatica 02/05/2020   Gastroparesis 11/03/2019   Hospital discharge follow-up 11/03/2019   DDD (degenerative disc disease), lumbar 06/30/2019   Lumbar back pain with radiculopathy affecting lower extremity 06/20/2019   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/21/2019   History of left knee replacement 02/10/2018   Morbid obesity ([place]) 11/21/2017   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   Episodic mood disorder ([place]) 05/12/2016   Controlled diabetes mellitus type 2 with complications ([place]) 35/57/3220   Gastroesophageal reflux disease without esophagitis 05/12/2016   DYSPNEA 07/22/2010   CHEST PAIN 07/22/2010   Hypothyroidism 09/13/2007   Hyperlipidemia 09/13/2007   Depression, major, single episode, moderate ([place]) 09/13/2007   MIGRAINE HEADACHE 09/13/2007   Essential hypertension 09/13/2007   Asthma 09/13/2007   CHOLELITHIASIS, HX OF 09/13/2007   NEPHROLITHIASIS, HX OF 09/13/2007   CARPAL TUNNEL RELEASE, HX OF 09/13/2007    Past Surgical History:  Procedure Laterality Date   ABDOMINAL SURGERY     ANTERIOR CERVICAL DECOMP/DISCECTOMY FUSION N/A 10/09/2020   Procedure: Anterior Cervical Discectomy Fusion Cervical five-six;  Surgeon: Vallarie Mare, MD;  Location: [facility];  Service: Neurosurgery;  Laterality: N/A;   BIOPSY  02/04/2021   Procedure: BIOPSY;  Surgeon: Yetta Flock, MD;  Location: [facility];  Service: Gastroenterology;;   Lum Keas  INJECTION N/A 02/04/2021   Procedure: BOTOX INJECTION;  Surgeon: Yetta Flock, MD;  Location: [facility];  Service: Gastroenterology;  Laterality: N/A;   CARPAL TUNNEL RELEASE Bilateral 1999   CESAREAN SECTION     CHOLECYSTECTOMY     COLONOSCOPY     ESOPHAGEAL MANOMETRY N/A 07/08/2020   Procedure: ESOPHAGEAL MANOMETRY (EM);  Surgeon: Yetta Flock, MD;  Location: [facility];  Service: Gastroenterology;  Laterality: N/A;   ESOPHAGOGASTRODUODENOSCOPY (EGD) WITH PROPOFOL N/A 02/04/2021    Procedure: ESOPHAGOGASTRODUODENOSCOPY (EGD) WITH PROPOFOL;  Surgeon: Yetta Flock, MD;  [facility];  Service: Gastroenterology;  Laterality: N/A;   REPLACEMENT TOTAL KNEE Bilateral    SCLEROTHERAPY  02/04/2021   Procedure: SCLEROTHERAPY;  Surgeon: Yetta Flock, MD;  Location: [facility];  Service: Gastroenterology;;   THYROIDECTOMY     TONSILLECTOMY     TONSILLECTOMY AND ADENOIDECTOMY     UPPER GASTROINTESTINAL ENDOSCOPY      OB History     Gravida  1   Para  1   Term  1   Preterm      AB      Living         SAB      IAB      Ectopic      Multiple      Live Births               Home Medications    Prior to Admission medications   Medication Sig Start Date End Date Taking? Authorizing Provider  Accu-Chek FastClix Lancets MISC Test blood sugars daily Dx E11.9 06/25/20   Ronnie Doss M, DO  albuterol (PROVENTIL) (2.5 MG/3ML) 0.083% nebulizer solution Take 3 mLs (2.5 mg total) by nebulization every 6 (six) hours as needed for wheezing or shortness of breath. Fill upon patient request 09/26/20   Martyn Ehrich, NP  albuterol (VENTOLIN HFA) 108 (90 Base) MCG/ACT inhaler Inhale 2 puffs into the lungs every 6 (six) hours as needed for wheezing or shortness of breath. Fill upon patient request 09/26/20   Martyn Ehrich, NP  baclofen (LIORESAL) 10 MG tablet TAKE 0.5-1 TABLETS BY MOUTH 3 TIMES DAILY AS NEEDED FOR MUSCLE SPASMS. 01/27/21   Janora Norlander, DO  Blood Glucose Monitoring Suppl (ACCU-CHEK GUIDE ME) w/Device KIT Use to check BS BID and as needed. DX: E11.9 06/25/20   Ronnie Doss M, DO  budesonide-formoterol (SYMBICORT) 160-4.5 MCG/ACT inhaler Inhale 2 puffs into the lungs 2 (two) times daily. 09/26/20   Martyn Ehrich, NP  Continuous Blood Gluc Sensor (FREESTYLE LIBRE 3 SENSOR) MISC 1 each by Does not apply route every 14 (fourteen) days. Place 1 sensor on the skin every 14 days. Use to check glucose continuously  03/26/21   Ronnie Doss M, DO  dexlansoprazole (DEXILANT) 60 MG capsule Take 1 capsule (60 mg total) by mouth daily. 01/13/21   Armbruster, Carlota Raspberry, MD  diclofenac Sodium (VOLTAREN) 1 % GEL APPLY 4 GRAMS TOPICALLY 4 (FOUR) TIMES DAILY AS NEEDED (KNEE PAIN). 01/27/21   Janora Norlander, DO  dicyclomine (BENTYL) 20 MG tablet TAKE 1 TABLET (20 MG TOTAL) BY MOUTH EVERY 6 (SIX) HOURS. Patient taking differently: Take 20 mg by mouth every 6 (six) hours as needed for spasms (abdominal cramps). 08/07/20   Janora Norlander, DO  docusate sodium (COLACE) 100 MG capsule Take 1 capsule (100 mg total) by mouth 2 (two) times daily. 10/10/20   Vallarie Mare, MD  gabapentin (NEURONTIN) 300  MG capsule 1 capsule (300 mg total) 3 (three) times daily. 11/06/20   Janora Norlander, DO  glucose blood (ACCU-CHEK GUIDE) test strip Test blood sugars daily Dx E11.9 11/12/20   Ronnie Doss M, DO  hydrOXYzine (ATARAX/VISTARIL) 25 MG tablet TAKE 1 TABLET BY MOUTH THREE TIMES A DAY Patient taking differently: Take 25 mg by mouth 3 (three) times daily as needed for itching or anxiety. 08/08/20   Janora Norlander, DO  ibuprofen (ADVIL) 600 MG tablet Take 1 tablet (600 mg total) by mouth at bedtime as needed. 01/24/21   Janora Norlander, DO  insulin degludec (TRESIBA FLEXTOUCH) 200 UNIT/ML FlexTouch Pen Inject 66 Units into the skin at bedtime. 03/19/21   Janora Norlander, DO  Insulin Pen Needle (B-D UF III MINI PEN NEEDLES) 31G X 5 MM MISC use as directed once daily Dx E11.8 02/17/21   Ronnie Doss M, DO  lamoTRIgine (LAMICTAL) 150 MG tablet TAKE 1 TABLET BY MOUTH TWICE A DAY Patient taking differently: Take 300 mg by mouth daily. 01/21/21   Janora Norlander, DO  levofloxacin (LEVAQUIN) 500 MG tablet Take 1 tablet (500 mg total) by mouth daily. Patient not taking: No sig reported 01/23/21   Chevis Pretty, FNP  levothyroxine (SYNTHROID) 125 MCG tablet Take 1 tablet (125 mcg total) by mouth  daily. Patient taking differently: Take 125 mcg by mouth at bedtime. 07/10/20   Janora Norlander, DO  linaclotide (LINZESS) 72 MCG capsule Take 1 capsule (72 mcg total) by mouth daily before breakfast. Patient taking differently: Take 72 mcg by mouth daily as needed (constipation). 03/22/20   Levin Erp, PA  lisdexamfetamine (VYVANSE) 70 MG capsule Take 1 capsule (70 mg total) by mouth daily. 09/25/20   Janora Norlander, DO  lisdexamfetamine (VYVANSE) 70 MG capsule Take 1 capsule (70 mg total) by mouth daily. 01/09/21   Janora Norlander, DO  lisdexamfetamine (VYVANSE) 70 MG capsule Take 1 capsule (70 mg total) by mouth daily. Patient not taking: Reported on 02/03/2021 12/09/20   Janora Norlander, DO  medroxyPROGESTERone (DEPO-PROVERA) 150 MG/ML injection Inject 1 mL (150 mg total) into the muscle every 3 (three) months. 07/10/20   Janora Norlander, DO  Mepolizumab (NUCALA) 100 MG/ML SOAJ Inject 1 mL (100 mg total) into the skin every 28 (twenty-eight) days. Deliver to patient's home 10/29/20   Hunsucker, Bonna Gains, MD  montelukast (SINGULAIR) 10 MG tablet TAKE ONE TAB BY MOUTH AT BEDTIME Patient taking differently: Take 10 mg by mouth at bedtime. 07/10/20   Janora Norlander, DO  nystatin cream (MYCOSTATIN) Apply 1 application topically 2 (two) times daily. x2 weeks Patient taking differently: Apply 1 application topically 2 (two) times daily as needed (rash). 01/24/20   Janora Norlander, DO  ondansetron (ZOFRAN ODT) 8 MG disintegrating tablet Take 0.5-1 tablets (4-8 mg total) by mouth every 8 (eight) hours as needed for nausea or vomiting. 01/29/21   Janora Norlander, DO  pioglitazone (ACTOS) 45 MG tablet Take 1 tablet (45 mg total) by mouth daily. 11/01/20   Janora Norlander, DO  promethazine (PHENERGAN) 25 MG tablet Take 1 tablet (25 mg total) by mouth every 6 (six) hours as needed for nausea or vomiting. 06/11/20   Rosemarie Ax, MD  rosuvastatin (CRESTOR) 40 MG  tablet Take 1 tablet (40 mg total) by mouth daily. Patient taking differently: Take 40 mg by mouth at bedtime. 09/11/20   Ronnie Doss M, DO  Semaglutide (RYBELSUS) 14 MG TABS  Take 1 tablet by mouth daily before breakfast. Patient taking differently: Take 14 mg by mouth daily before breakfast. 07/10/20   Delynn Flavin M, DO  sertraline (ZOLOFT) 100 MG tablet Take 2 tablets (200 mg total) by mouth daily. Patient taking differently: Take 200 mg by mouth at bedtime. 07/10/20   Raliegh Ip, DO    Family History Family History  Problem Relation Age of Onset   Mental illness Other    Diabetes Other    Healthy Mother    Cancer Father    Non-Hodgkin's lymphoma Father    Breast cancer Paternal Grandmother    Lung cancer Paternal Grandmother    Esophageal cancer Other    Colon cancer Neg Hx    Stomach cancer Neg Hx    Pancreatic cancer Neg Hx    Colon polyps Neg Hx     Social History Social History   Tobacco Use   Smoking status: Former    Packs/day: 1.00    Years: 5.00    Pack years: 5.00    Types: Cigarettes    Quit date: 04/04/1993    Years since quitting: 28.0   Smokeless tobacco: Never   Tobacco comments:    smoked when went out with friends/social smoker. pack would last a week.  Vaping Use   Vaping Use: Former  Substance Use Topics   Alcohol use: No   Drug use: No     Allergies   Diphenhydramine, Morphine, Prednisone, Adhesive [tape], Erythromycin, Metformin and related, and Sulfonamide derivatives   Review of Systems Review of Systems  Genitourinary:  Positive for pelvic pain.  Musculoskeletal:  Positive for back pain.  All other systems reviewed and are negative.   Physical Exam Triage Vital Signs ED Triage Vitals  Enc Vitals Group     BP 04/15/21 1720 (!) 157/92     Pulse Rate 04/15/21 1720 86     Resp 04/15/21 1720 14     Temp 04/15/21 1720 98.6 F (37 C)     Temp Source 04/15/21 1720 Oral     SpO2 04/15/21 1720 97 %     Weight --       Height --      Head Circumference --      Peak Flow --      Pain Score 04/15/21 1722 10     Pain Loc --      Pain Edu? --      Excl. in GC? --    No data found.  Updated Vital Signs BP (!) 157/92 (BP Location: Right Arm)   Pulse 86   Temp 98.6 F (37 C) (Oral)   Resp 14   SpO2 97%   Physical Exam Vitals and nursing note reviewed.  Constitutional:      Appearance: Normal appearance. She is obese.  HENT:     Head: Normocephalic and atraumatic.     Mouth/Throat:     Mouth: Mucous membranes are moist.     Pharynx: Oropharynx is clear.  Eyes:     Extraocular Movements: Extraocular movements intact.     Conjunctiva/sclera: Conjunctivae normal.     Pupils: Pupils are equal, round, and reactive to light.  Cardiovascular:     Rate and Rhythm: Normal rate and regular rhythm.     Pulses: Normal pulses.     Heart sounds: Normal heart sounds.  Pulmonary:     Effort: Pulmonary effort is normal.     Breath sounds: Normal breath sounds.  Abdominal:  Tenderness: There is right CVA tenderness. There is no left CVA tenderness.  Musculoskeletal:        General: Normal range of motion.     Cervical back: Normal range of motion and neck supple.  Skin:    General: Skin is warm and dry.  Neurological:     General: No focal deficit present.     Mental Status: She is alert and oriented to person, place, and time.     UC Treatments / Results  Labs (all labs ordered are listed, but only abnormal results are displayed) Labs Reviewed  POCT URINALYSIS DIP (MANUAL ENTRY) - Abnormal; Notable for the following components:      Result Value   Spec Grav, UA >=1.030 (*)    Protein Ur, POC trace (*)    All other components within normal limits    EKG   Radiology No results found.  Procedures Procedures (including critical care time)  Medications Ordered in UC Medications  ondansetron (ZOFRAN-ODT) disintegrating tablet 4 mg (4 mg Oral Given 04/15/21 1726)    Initial  Impression / Assessment and Plan / UC Course  I have reviewed the triage vital signs and the nursing notes.  Pertinent labs & imaging results that were available during my care of the patient were reviewed by me and considered in my medical decision making (see chart for details).     MDM: 1.  Pelvic pain-Advised/instructed patient to go to [facility] ED now for further evaluation of pelvic pain, to rule out pyelonephritis or obstructive kidney stone.  Patient agreed and verbalized understanding of these instructions and this plan of care today.  Patient reports that her fianc will drive her to this emergency department now. Final Clinical Impressions(s) / UC Diagnoses   Final diagnoses:  Pelvic pain in female     Discharge Instructions      Advised/instructed patient to go to [facility] ED now for further evaluation of pelvic pain, to rule out pyelonephritis or obstructive kidney stone.  Patient agreed and verbalized understanding of these instructions and this plan of care today.  Patient reports that her fianc will drive her to this emergency department now.     ED Prescriptions   None    PDMP not reviewed this encounter.   Eliezer Lofts, [place] 04/15/21 1840

## 2021-04-15 NOTE — ED Triage Notes (Signed)
Pt presents with back pain and spasms and pelvic pain that began yesterday. Pt states she has taking a pain pill and muscle relaxer PTA. Pain is constant. Pt states she is also nauseous. Pt has a hx of kidney stones.

## 2021-04-15 NOTE — Discharge Instructions (Addendum)
Advised/instructed patient to go to [facility] ED now for further evaluation of pelvic pain, to rule out pyelonephritis or obstructive kidney stone.  Patient agreed and verbalized understanding of these instructions and this plan of care today.  Patient reports that her fianc will drive her to this emergency department now.

## 2021-04-16 ENCOUNTER — Other Ambulatory Visit: Payer: Self-pay | Admitting: Family Medicine

## 2021-04-16 ENCOUNTER — Ambulatory Visit: Payer: Medicaid Other | Admitting: Family Medicine

## 2021-04-16 ENCOUNTER — Encounter: Payer: Self-pay | Admitting: Family Medicine

## 2021-04-16 ENCOUNTER — Telehealth: Payer: Self-pay | Admitting: Pulmonary Disease

## 2021-04-16 ENCOUNTER — Other Ambulatory Visit (HOSPITAL_COMMUNITY): Payer: Self-pay

## 2021-04-16 VITALS — BP 126/75 | HR 86 | Temp 96.9°F | Ht 68.0 in | Wt 307.5 lb

## 2021-04-16 DIAGNOSIS — E039 Hypothyroidism, unspecified: Secondary | ICD-10-CM | POA: Diagnosis not present

## 2021-04-16 DIAGNOSIS — N3000 Acute cystitis without hematuria: Secondary | ICD-10-CM

## 2021-04-16 DIAGNOSIS — M546 Pain in thoracic spine: Secondary | ICD-10-CM

## 2021-04-16 DIAGNOSIS — R110 Nausea: Secondary | ICD-10-CM

## 2021-04-16 DIAGNOSIS — R1031 Right lower quadrant pain: Secondary | ICD-10-CM | POA: Diagnosis not present

## 2021-04-16 LAB — URINALYSIS, ROUTINE W REFLEX MICROSCOPIC
Bilirubin, UA: NEGATIVE
Glucose, UA: NEGATIVE
Ketones, UA: NEGATIVE
Nitrite, UA: NEGATIVE
Protein,UA: NEGATIVE
RBC, UA: NEGATIVE
Specific Gravity, UA: 1.025 (ref 1.005–1.030)
Urobilinogen, Ur: 0.2 mg/dL (ref 0.2–1.0)
pH, UA: 6 (ref 5.0–7.5)

## 2021-04-16 LAB — MICROSCOPIC EXAMINATION
RBC, Urine: NONE SEEN /hpf (ref 0–2)
Renal Epithel, UA: NONE SEEN /hpf

## 2021-04-16 MED ORDER — NITROFURANTOIN MONOHYD MACRO 100 MG PO CAPS: 100.0000 mg | ORAL_CAPSULE | Freq: Two times a day (BID) | ORAL | 0 refills | Status: AC

## 2021-04-16 MED ORDER — ONDANSETRON 4 MG PO TBDP: 4.0000 mg | ORAL_TABLET | Freq: Three times a day (TID) | ORAL | 0 refills | Status: DC | PRN

## 2021-04-16 MED ORDER — CEPHALEXIN 500 MG PO CAPS: 500.0000 mg | ORAL_CAPSULE | Freq: Two times a day (BID) | ORAL | 0 refills | Status: DC

## 2021-04-16 NOTE — Telephone Encounter (Signed)
Can you please advise if she needs a prior authorization or if she needs a refill or an authorization? Please advise.

## 2021-04-16 NOTE — Progress Notes (Signed)
Acute Office Visit  Subjective:    Patient ID: Judith [name], female    DOB: [birth year], 50 y.o.   MRN: [id number]  Chief Complaint  Patient presents with   Back Pain    HPI Patient is in today for left sided flank pain x 2 days. The pain is constant with intermittent spasms. The pain worsened yesterday. The pain is a 8/10 today. It is worse with deep breathing. She then also had some sharp pain in her right groin that is constant. This pain is a 4/10. She also reports some frequency and hesitancy. She denies fever, chills, vomiting, dysuria, or hematuria. She has been nauseous.   She went to the ED yesterday but left due to the wait time. She then went to UC. Her UA was unremarkable and she was instructed to go the ED for further work up. She then went to [facility] last night. They did a CT scan and it was normal. There was no blockage or kidney stones. UA was also normal.   She has tried baclofen, flexeril, ibuprofen, tramadol, and hydrocodone without much improvement. She did get a Toradol injection in the hospital last night that did help somewhat.   The pain in her right groin has improved but the pain in her back has not improved. She does have a history of thoracic pain and lumbar radiculopathy. She also has a history of UTIs.    Past Medical History:  Diagnosis Date   Allergy    Anxiety    Arthritis    Asthma    Bipolar 1 disorder ([place])    Blood transfusion without reported diagnosis    with child birth 56 yrs ago    Bronchitis, chronic ([place])    Constipation    COVID-19 virus infection 06/21/2020   DDD (degenerative disc disease), cervical    Depression    Diabetes mellitus without complication ([place])    Dysphagia    Family history of adverse reaction to anesthesia    mother had a hard time waking up after knee surgery in 2021   Gastroparesis    GERD (gastroesophageal reflux disease)    History of kidney stones    History of MRSA infection    Hyperlipemia     Hypothyroid    Migraines    Pneumonia    Sleep apnea    no cpap     Past Surgical History:  Procedure Laterality Date   ABDOMINAL SURGERY     ANTERIOR CERVICAL DECOMP/DISCECTOMY FUSION N/A 10/09/2020   Procedure: Anterior Cervical Discectomy Fusion Cervical five-six;  Surgeon: Vallarie Mare, MD;  Location: [facility];  Service: Neurosurgery;  Laterality: N/A;   BIOPSY  02/04/2021   Procedure: BIOPSY;  Surgeon: Yetta Flock, MD;  Location: [facility];  Service: Gastroenterology;;   BOTOX INJECTION N/A 02/04/2021   Procedure: BOTOX INJECTION;  Surgeon: Yetta Flock, MD;  Location: [facility];  Service: Gastroenterology;  Laterality: N/A;   CARPAL TUNNEL RELEASE Bilateral 1999   CESAREAN SECTION     CHOLECYSTECTOMY     COLONOSCOPY     ESOPHAGEAL MANOMETRY N/A 07/08/2020   Procedure: ESOPHAGEAL MANOMETRY (EM);  Surgeon: Yetta Flock, MD;  [facility];  Service: Gastroenterology;  Laterality: N/A;   ESOPHAGOGASTRODUODENOSCOPY (EGD) WITH PROPOFOL N/A 02/04/2021   Procedure: ESOPHAGOGASTRODUODENOSCOPY (EGD) WITH PROPOFOL;  Surgeon: Yetta Flock, MD;  [facility];  Service: Gastroenterology;  Laterality: N/A;   REPLACEMENT TOTAL KNEE Bilateral    SCLEROTHERAPY  02/04/2021  Procedure: SCLEROTHERAPY;  Surgeon: Yetta Flock, MD;  Location: [facility];  Service: Gastroenterology;;   THYROIDECTOMY     TONSILLECTOMY     TONSILLECTOMY AND ADENOIDECTOMY     UPPER GASTROINTESTINAL ENDOSCOPY      Family History  Problem Relation Age of Onset   Mental illness Other    Diabetes Other    Healthy Mother    Cancer Father    Non-Hodgkin's lymphoma Father    Breast cancer Paternal Grandmother    Lung cancer Paternal Grandmother    Esophageal cancer Other    Colon cancer Neg Hx    Stomach cancer Neg Hx    Pancreatic cancer Neg Hx    Colon polyps Neg Hx     Social History   Socioeconomic History   Marital status: Divorced     Spouse name: Not on file   Number of children: Not on file   Years of education: Not on file   Highest education level: Not on file  Occupational History   Not on file  Tobacco Use   Smoking status: Former    Packs/day: 1.00    Years: 5.00    Pack years: 5.00    Types: Cigarettes    Quit date: 04/04/1993    Years since quitting: 28.0   Smokeless tobacco: Never   Tobacco comments:    smoked when went out with friends/social smoker. pack would last a week.  Vaping Use   Vaping Use: Former  Substance and Sexual Activity   Alcohol use: No   Drug use: No   Sexual activity: Yes    Birth control/protection: I.U.D.  Other Topics Concern   Not on file  Social History Narrative   Not on file   Social Determinants of Health   Financial Resource Strain: Not on file  Food Insecurity: Not on file  Transportation Needs: Not on file  Physical Activity: Not on file  Stress: Not on file  Social Connections: Not on file  Intimate Partner Violence: Not on file    Outpatient Medications Prior to Visit  Medication Sig Dispense Refill   Accu-Chek FastClix Lancets MISC Test blood sugars daily Dx E11.9 102 each 3   albuterol (PROVENTIL) (2.5 MG/3ML) 0.083% nebulizer solution Take 3 mLs (2.5 mg total) by nebulization every 6 (six) hours as needed for wheezing or shortness of breath. Fill upon patient request 75 mL 12   albuterol (VENTOLIN HFA) 108 (90 Base) MCG/ACT inhaler Inhale 2 puffs into the lungs every 6 (six) hours as needed for wheezing or shortness of breath. Fill upon patient request 8 g 1   baclofen (LIORESAL) 10 MG tablet TAKE 0.5-1 TABLETS BY MOUTH 3 TIMES DAILY AS NEEDED FOR MUSCLE SPASMS. 90 tablet 12   Blood Glucose Monitoring Suppl (ACCU-CHEK GUIDE ME) w/Device KIT Use to check BS BID and as needed. DX: E11.9 1 kit 0   budesonide-formoterol (SYMBICORT) 160-4.5 MCG/ACT inhaler Inhale 2 puffs into the lungs 2 (two) times daily. 1 each 3   Continuous Blood Gluc Sensor (FREESTYLE  LIBRE 3 SENSOR) MISC 1 each by Does not apply route every 14 (fourteen) days. Place 1 sensor on the skin every 14 days. Use to check glucose continuously 2 each 11   dexlansoprazole (DEXILANT) 60 MG capsule Take 1 capsule (60 mg total) by mouth daily. 90 capsule 3   diclofenac Sodium (VOLTAREN) 1 % GEL APPLY 4 GRAMS TOPICALLY 4 (FOUR) TIMES DAILY AS NEEDED (KNEE PAIN). 400 g 0   dicyclomine (BENTYL)  20 MG tablet TAKE 1 TABLET (20 MG TOTAL) BY MOUTH EVERY 6 (SIX) HOURS. (Patient taking differently: Take 20 mg by mouth every 6 (six) hours as needed for spasms (abdominal cramps).) 360 tablet 0   docusate sodium (COLACE) 100 MG capsule Take 1 capsule (100 mg total) by mouth 2 (two) times daily. 60 capsule 0   gabapentin (NEURONTIN) 300 MG capsule 1 capsule (300 mg total) 3 (three) times daily. 90 capsule 3   glucose blood (ACCU-CHEK GUIDE) test strip Test blood sugars daily Dx E11.9 100 strip 3   hydrOXYzine (ATARAX/VISTARIL) 25 MG tablet TAKE 1 TABLET BY MOUTH THREE TIMES A DAY (Patient taking differently: Take 25 mg by mouth 3 (three) times daily as needed for itching or anxiety.) 270 tablet 1   ibuprofen (ADVIL) 600 MG tablet Take 1 tablet (600 mg total) by mouth at bedtime as needed. 30 tablet 3   insulin degludec (TRESIBA FLEXTOUCH) 200 UNIT/ML FlexTouch Pen Inject 66 Units into the skin at bedtime. 9 mL 1   Insulin Pen Needle (B-D UF III MINI PEN NEEDLES) 31G X 5 MM MISC use as directed once daily Dx E11.8 100 each 4   lamoTRIgine (LAMICTAL) 150 MG tablet TAKE 1 TABLET BY MOUTH TWICE A DAY (Patient taking differently: Take 300 mg by mouth daily.) 180 tablet 0   levothyroxine (SYNTHROID) 125 MCG tablet Take 1 tablet (125 mcg total) by mouth daily. (Patient taking differently: Take 125 mcg by mouth at bedtime.) 90 tablet 1   linaclotide (LINZESS) 72 MCG capsule Take 1 capsule (72 mcg total) by mouth daily before breakfast. (Patient taking differently: Take 72 mcg by mouth daily as needed  (constipation).) 30 capsule 2   lisdexamfetamine (VYVANSE) 70 MG capsule Take 1 capsule (70 mg total) by mouth daily. 30 capsule 0   lisdexamfetamine (VYVANSE) 70 MG capsule Take 1 capsule (70 mg total) by mouth daily. 30 capsule 0   lisdexamfetamine (VYVANSE) 70 MG capsule Take 1 capsule (70 mg total) by mouth daily. 30 capsule 0   medroxyPROGESTERone (DEPO-PROVERA) 150 MG/ML injection Inject 1 mL (150 mg total) into the muscle every 3 (three) months. 1 mL 3   Mepolizumab (NUCALA) 100 MG/ML SOAJ Inject 1 mL (100 mg total) into the skin every 28 (twenty-eight) days. Deliver to patient's home 3 mL 1   montelukast (SINGULAIR) 10 MG tablet TAKE ONE TAB BY MOUTH AT BEDTIME (Patient taking differently: Take 10 mg by mouth at bedtime.) 90 tablet 3   nystatin cream (MYCOSTATIN) Apply 1 application topically 2 (two) times daily. x2 weeks (Patient taking differently: Apply 1 application topically 2 (two) times daily as needed (rash).) 60 g 0   ondansetron (ZOFRAN ODT) 8 MG disintegrating tablet Take 0.5-1 tablets (4-8 mg total) by mouth every 8 (eight) hours as needed for nausea or vomiting. 20 tablet 0   pioglitazone (ACTOS) 45 MG tablet Take 1 tablet (45 mg total) by mouth daily. 90 tablet 1   promethazine (PHENERGAN) 25 MG tablet Take 1 tablet (25 mg total) by mouth every 6 (six) hours as needed for nausea or vomiting. 30 tablet 0   rosuvastatin (CRESTOR) 40 MG tablet Take 1 tablet (40 mg total) by mouth daily. (Patient taking differently: Take 40 mg by mouth at bedtime.) 90 tablet 3   Semaglutide (RYBELSUS) 14 MG TABS Take 1 tablet by mouth daily before breakfast. (Patient taking differently: Take 14 mg by mouth daily before breakfast.) 30 tablet prn   sertraline (ZOLOFT) 100 MG tablet Take  2 tablets (200 mg total) by mouth daily. (Patient taking differently: Take 200 mg by mouth at bedtime.) 180 tablet 3   levofloxacin (LEVAQUIN) 500 MG tablet Take 1 tablet (500 mg total) by mouth daily. 5 tablet 0    Facility-Administered Medications Prior to Visit  Medication Dose Route Frequency Provider Last Rate Last Admin   medroxyPROGESTERone (DEPO-PROVERA) injection 150 mg  150 mg Intramuscular Q90 days Gottschalk, Ashly M, DO   150 mg at 02/13/21 1100    Allergies  Allergen Reactions   Diphenhydramine Other (See Comments)    Hair feels like it is crawling   Morphine Nausea And Vomiting    PCA PUMP/ DRIP -- N/V IV PUSH IN ER NO PROBLEM PER PT.     Prednisone Nausea And Vomiting   Adhesive [Tape]     Paper tape causes irritation    Erythromycin Hives   Metformin And Related     Diarrhea with IR and XR products   Sulfonamide Derivatives Other (See Comments)    Does not take due to family history    Review of Systems As per HPI.     Objective:    Physical Exam Vitals and nursing note reviewed.  Constitutional:      General: She is not in acute distress.    Appearance: She is not ill-appearing, toxic-appearing or diaphoretic.  Cardiovascular:     Rate and Rhythm: Normal rate and regular rhythm.     Heart sounds: Normal heart sounds. No murmur heard. Pulmonary:     Effort: Pulmonary effort is normal. No respiratory distress.     Breath sounds: Normal breath sounds.  Abdominal:     General: Bowel sounds are normal. There is no distension.     Palpations: Abdomen is soft.     Tenderness: There is no abdominal tenderness. There is no right CVA tenderness, left CVA tenderness, guarding or rebound.  Musculoskeletal:     Right lower leg: No edema.     Left lower leg: No edema.  Skin:    General: Skin is warm and dry.  Neurological:     General: No focal deficit present.     Mental Status: She is alert and oriented to person, place, and time.  Psychiatric:        Mood and Affect: Mood normal.        Behavior: Behavior normal.    BP 126/75   Pulse 86   Temp (!) 96.9 F (36.1 C) (Temporal)   Ht _0  (1.727 m)   Wt (!) 307 lb 8 oz (139.5 kg)   SpO2 95%   BMI 46.76 kg/m   Wt Readings from Last 3 Encounters:  04/16/21 (!) 307 lb 8 oz (139.5 kg)  02/04/21 (!) 302 lb 7.5 oz (137.2 kg)  01/29/21 (!) 302 lb 6.4 oz (137.2 kg)    Urine dipstick shows positive for leukocytes.  Micro exam: 0-5 WBC's per HPF and moderate + bacteria.   Health Maintenance Due  Topic Date Due   OPHTHALMOLOGY EXAM  Never done   COVID-19 Vaccine (5 - Booster for Moderna series) 01/21/2021    There are no preventive care reminders to display for this patient.   Lab Results  Component Value Date   TSH 0.124 (L) 01/22/2021   Lab Results  Component Value Date   WBC 7.7 01/22/2021   HGB 13.7 01/22/2021   HCT 41.7 01/22/2021   MCV 82 01/22/2021   PLT 381 01/22/2021   Lab Results  Component  Value Date   NA 139 01/22/2021   K 4.3 01/22/2021   CO2 25 01/22/2021   GLUCOSE 101 (H) 01/22/2021   BUN 12 01/22/2021   CREATININE 0.76 01/22/2021   BILITOT <0.2 01/22/2021   ALKPHOS 116 01/22/2021   AST 11 01/22/2021   ALT 7 01/22/2021   PROT 6.7 01/22/2021   ALBUMIN 4.0 01/22/2021   CALCIUM 9.1 01/22/2021   ANIONGAP 10 10/07/2020   EGFR 95 01/22/2021   Lab Results  Component Value Date   CHOL 144 01/22/2021   Lab Results  Component Value Date   HDL 30 (L) 01/22/2021   Lab Results  Component Value Date   LDLCALC 92 01/22/2021   Lab Results  Component Value Date   TRIG 120 01/22/2021   Lab Results  Component Value Date   CHOLHDL 4.8 (H) 01/22/2021   Lab Results  Component Value Date   HGBA1C 6.8 (H) 01/22/2021       Assessment & Plan:   Ginelle was seen today for back pain.  Diagnoses and all orders for this visit:  Acute left-sided thoracic back pain UA positive for leuks, moderate bacteria. Will treat for UTI. Had negative CT scan at [facility] last night.  -     Urinalysis, Routine w reflex microscopic -     Urine Culture -     Microscopic Examination  Acute cystitis without hematuria Culture pending.  -     nitrofurantoin,  macrocrystal-monohydrate, (MACROBID) 100 MG capsule; Take 1 capsule (100 mg total) by mouth 2 (two) times daily for 5 days. 1 po BId  Nausea -     ondansetron (ZOFRAN-ODT) 4 MG disintegrating tablet; Take 1-2 tablets (4-8 mg total) by mouth every 8 (eight) hours as needed for nausea or vomiting.  Acquired hypothyroidism Future labs previously ordered by PCP obtained today. -     TSH -     T4, Free  Return to office for new or worsening symptoms, or if symptoms persist.   The patient indicates understanding of these issues and agrees with the plan.  Gwenlyn Perking, FNP

## 2021-04-16 NOTE — Telephone Encounter (Signed)
[name] last OV was prior to her starting Nucala. Her current prior authorization is expiring, however we cannot submit a new one until she has a f/u OV to evaluate the effectiveness of the medication.

## 2021-04-16 NOTE — Patient Instructions (Signed)
Flank Pain, Adult ?Flank pain is pain that is located on the side of the body between the upper abdomen and the spine. This area is called the flank. The pain may occur over a short period of time (acute), or it may be long-term or recurring (chronic). It may be mild or severe. Flank pain can be caused by many things, including: ?Muscle soreness or injury. ?Kidney infection, kidney stones, or kidney disease. ?Stress. ?A disease of the spine (vertebral disk disease). ?A lung infection (pneumonia). ?Fluid around the lungs (pulmonary edema). ?A skin rash caused by the chickenpox virus (shingles). ?Tumors that affect the back of the abdomen. ?Gallbladder disease. ?Follow these instructions at home: ? ?Drink enough fluid to keep your urine pale yellow. ?Rest as told by your health care provider. ?Take over-the-counter and prescription medicines only as told by your health care provider. ?Keep a journal to track what has caused your flank pain and what has made it feel better. ?Keep all follow-up visits. This is important. ?Contact a health care provider if: ?Your pain is not controlled with medicine. ?You have new symptoms. ?Your pain gets worse. ?Your symptoms last longer than 2-3 days. ?You have trouble urinating or you are urinating very frequently. ?Get help right away if: ?You have trouble breathing or you are short of breath. ?Your abdomen hurts or it is swollen or red. ?You have nausea or vomiting. ?You feel faint, or you faint. ?You have blood in your urine. ?You have flank pain and a fever. ?These symptoms may represent a serious problem that is an emergency. Do not wait to see if the symptoms will go away. Get medical help right away. Call your local emergency services (911 in the [place] Do not drive yourself to the hospital. ?Summary ?Flank pain is pain that is located on the side of the body between the upper abdomen and the spine. ?The pain may occur over a short period of time (acute), or it may be  long-term or recurring (chronic). It may be mild or severe. ?Flank pain can be caused by many things. ?Contact your health care provider if your symptoms get worse or last longer than 2-3 days. ?This information is not intended to replace advice given to you by your health care provider. Make sure you discuss any questions you have with your health care provider. ?Document Revised: 07/15/2020 Document Reviewed: 07/15/2020 ?Elsevier Patient Education ? 2022 Elsevier Inc. ? ?

## 2021-04-17 LAB — T4, FREE: Free T4: 0.89 ng/dL (ref 0.82–1.77)

## 2021-04-17 LAB — TSH: TSH: 1.17 u[IU]/mL (ref 0.450–4.500)

## 2021-04-18 NOTE — Telephone Encounter (Signed)
I called the patient and she is aware that we will work on the authorization once she has an OV for her Nucala. Nothing further needed.

## 2021-04-19 LAB — URINE CULTURE

## 2021-04-21 ENCOUNTER — Telehealth: Payer: Self-pay

## 2021-04-21 ENCOUNTER — Ambulatory Visit: Payer: Medicaid Other | Admitting: Family Medicine

## 2021-04-21 ENCOUNTER — Other Ambulatory Visit: Payer: Self-pay | Admitting: Family Medicine

## 2021-04-21 DIAGNOSIS — F988 Other specified behavioral and emotional disorders with onset usually occurring in childhood and adolescence: Secondary | ICD-10-CM

## 2021-04-21 MED ORDER — LISDEXAMFETAMINE DIMESYLATE 70 MG PO CAPS: 70.0000 mg | ORAL_CAPSULE | Freq: Every day | ORAL | 0 refills | Status: DC

## 2021-04-21 NOTE — Telephone Encounter (Signed)
I've seen the pt in the last 3 months so will send in 1 month supply.  Please inform patient that this has been sent.  Will renew other 2 months when I see her Friday

## 2021-04-21 NOTE — Telephone Encounter (Signed)
Urine culture was negative. Have her schedule follow up appointment if symptoms have not improved.

## 2021-04-21 NOTE — Telephone Encounter (Signed)
WHEN CALLING PT BACK ABOUT MED REFILL SHE STATES SHE FEELS LIKE MEDS THAT WAS SENT OVER FOR UTI IS NOT HELPING, HAS REQUESTED YOU SEND OVER SOMETHING ELSE

## 2021-04-21 NOTE — Telephone Encounter (Signed)
Dr Lajuana Ripple is out of office today so pt had to be rescheduled until Friday. Pt states she is out of vvyanse and was depending on this appointment today for her refills.

## 2021-04-22 ENCOUNTER — Other Ambulatory Visit: Payer: Self-pay

## 2021-04-22 ENCOUNTER — Telehealth: Payer: Self-pay | Admitting: Pharmacist

## 2021-04-22 ENCOUNTER — Ambulatory Visit: Payer: Medicaid Other | Admitting: Pulmonary Disease

## 2021-04-22 ENCOUNTER — Other Ambulatory Visit (HOSPITAL_COMMUNITY): Payer: Self-pay

## 2021-04-22 ENCOUNTER — Encounter: Payer: Self-pay | Admitting: Pulmonary Disease

## 2021-04-22 ENCOUNTER — Telehealth (INDEPENDENT_AMBULATORY_CARE_PROVIDER_SITE_OTHER): Payer: Medicaid Other | Admitting: Pulmonary Disease

## 2021-04-22 DIAGNOSIS — J4540 Moderate persistent asthma, uncomplicated: Secondary | ICD-10-CM

## 2021-04-22 DIAGNOSIS — R0602 Shortness of breath: Secondary | ICD-10-CM

## 2021-04-22 MED ORDER — NUCALA 100 MG/ML ~~LOC~~ SOAJ
100.0000 mg | SUBCUTANEOUS | 1 refills | Status: DC
  Filled 2021-04-22: qty 1, 28d supply, fill #0
  Filled 2021-04-23 – 2021-07-21 (×2): qty 3, 84d supply, fill #0
  Filled 2021-07-21: qty 3, 84d supply, fill #1

## 2021-04-22 NOTE — Progress Notes (Signed)
Virtual Visit via Video Note  I connected with Judith [name] on 04/22/21 at 10:30 AM EST by a video enabled telemedicine application and verified that I am speaking with the correct person using two identifiers.  Location: Patient: Home Provider: Office - [facility] - [address], [address], [place], [place] [address]  I discussed the limitations of evaluation and management by telemedicine and the availability of in person appointments. The patient expressed understanding and agreed to proceed. I also discussed with the patient that there may be a patient responsible charge related to this service. The patient expressed understanding and agreed to proceed.  Patient consented to consult via telephone: Yes People present and their role in pt care: Pt   History of Present Illness:  50 y.o. woman whom we are seeing in follow-up of dyspnea on exertion, chronic bronchitis felt to be related to poorly controlled asthma.  Pulmonary note x2 from [facility] reviewed.  Patient with longstanding history of cough, shortness of breath.  Persistent symptoms despite aggressive inhaler therapy.  Was started on Nucala.  Eosinophil count elevated.  Since our Nucala, symptoms are essentially gone.  Dyspnea greatly improved.  Cough gone.  No flares.  No hospitalizations.  No need for prednisone since starting Nucala.  Overall doing quite well.  Start Nucala 10/2020.  Had 2 exacerbations in the month preceding initiation of Nucala requiring steroids.  ED notes 10/04/2020 and 11/04/2020 reviewed.    Chief complaint:    Dyspnea on exertion Observations/Objective:  Physical exam General: Well-appearing, in no acute distress Eyes: EOMI, no icterus Pulmonary: Normal work of breathing, speaks in full sentences Neuro: No focal weakness Psych: Normal mood, full affect  Social History   Tobacco Use  Smoking Status Former   Packs/day: 1.00   Years: 5.00   Pack years: 5.00   Types: Cigarettes   Quit  date: 04/04/1993   Years since quitting: 28.0  Smokeless Tobacco Never  Tobacco Comments   smoked when went out with friends/social smoker. pack would last a week.   Immunization History  Administered Date(s) Administered   Influenza, Seasonal, Injecte, Preservative Fre 02/09/2018   Influenza,inj,Quad PF,6+ Mos 02/14/2017, 02/20/2019   Influenza-Unspecified 02/05/2020, 01/21/2021   MMR [birth year]/1971   Moderna Sars-Covid-2 Vaccination 10/02/2019, 11/01/2019, 04/18/2020, 11/26/2020   PNEUMOCOCCAL CONJUGATE-20 11/26/2020   Pneumococcal Polysaccharide-23 02/20/2019, 11/01/2019   Td 03/03/1985, 06/30/1994   Tdap 12/24/2014, 11/26/2020   Zoster Recombinat (Shingrix) 02/05/2020    Assessment and Plan:  Asthma: Previously persistent severe with multiple exacerbations a year.  Started Nucala 10/2020.  No prednisone exacerbations since.  Symptoms of cough and dyspnea improved.  To continue q. monthly Nucala shots.  To continue ICS/LABA therapy with Symbicort.  She reports no rescue inhaler use.  Follow Up Instructions:  Return in about 6 months (around 10/21/2021).    I discussed the assessment and treatment plan with the patient. The patient was provided an opportunity to ask questions and all were answered. The patient agreed with the plan and demonstrated an understanding of the instructions.   The patient was advised to call back or seek an in-person evaluation if the symptoms worsen or if the condition fails to improve as anticipated.  I provided 31 minutes of non-face-to-face time during this encounter.   Lanier Clam, MD

## 2021-04-22 NOTE — Telephone Encounter (Signed)
PT AWARE NO MEDS UNTIL RECHECK

## 2021-04-22 NOTE — Telephone Encounter (Signed)
PA attempted - it looks like [facility] is not covering [place] yet - we may can get Elenor [place] 2 approved - if ok to change order??

## 2021-04-22 NOTE — Telephone Encounter (Signed)
  Pa submitted Patient aware

## 2021-04-22 NOTE — Telephone Encounter (Signed)
Patient had OV with Dr. Lupita Shutter today and stated her Nucala requires prior auth renewal. Refill for Nucala sent to [facility] x 6 months  Pharmacy team ensure prior auth is in place for patient's Archer Asa, PharmD, MPH, BCPS Clinical Pharmacist (Rheumatology and Pulmonology)

## 2021-04-22 NOTE — Telephone Encounter (Signed)
Ok to change back to the 2.  I think she should still have an active order for it.

## 2021-04-22 NOTE — Patient Instructions (Signed)
I am glad you are doing well  Will continue all medications including Nucala  I will send a message to our pharmacy team to look into the prior Auth  Follow-up with Dr. Silas Flood in clinic in 6 months

## 2021-04-23 ENCOUNTER — Other Ambulatory Visit (HOSPITAL_COMMUNITY): Payer: Self-pay

## 2021-04-23 NOTE — Telephone Encounter (Addendum)
Received notification from [facility] regarding a prior authorization for [place]. Authorization has been APPROVED from 04/23/2021 to 04/23/2022. Approval letter received and sent to scan center.  Patient can continue to fill through [facility]: [phone]   Authorization #  [id number] Key: Shela Nevin

## 2021-04-23 NOTE — Telephone Encounter (Signed)
Prior Auth for [place] approved and pharmacy aware.

## 2021-04-23 NOTE — Progress Notes (Signed)
Nucala auth approved through patient's insurance through 04/23/22. [facility] will fill and deliver to patient  Knox Saliva, PharmD, MPH, BCPS Clinical Pharmacist (Rheumatology and Pulmonology)

## 2021-04-24 ENCOUNTER — Other Ambulatory Visit (HOSPITAL_COMMUNITY): Payer: Self-pay

## 2021-04-25 ENCOUNTER — Encounter: Payer: Self-pay | Admitting: Family Medicine

## 2021-04-25 ENCOUNTER — Ambulatory Visit: Payer: Medicaid Other | Admitting: Family Medicine

## 2021-04-29 ENCOUNTER — Ambulatory Visit: Payer: Medicaid Other | Admitting: Family Medicine

## 2021-04-29 ENCOUNTER — Encounter: Payer: Self-pay | Admitting: Family Medicine

## 2021-04-29 VITALS — BP 135/90 | HR 94 | Temp 97.9°F | Ht 68.0 in | Wt 302.6 lb

## 2021-04-29 DIAGNOSIS — I152 Hypertension secondary to endocrine disorders: Secondary | ICD-10-CM | POA: Diagnosis not present

## 2021-04-29 DIAGNOSIS — E039 Hypothyroidism, unspecified: Secondary | ICD-10-CM | POA: Diagnosis not present

## 2021-04-29 DIAGNOSIS — E1159 Type 2 diabetes mellitus with other circulatory complications: Secondary | ICD-10-CM | POA: Diagnosis not present

## 2021-04-29 DIAGNOSIS — F988 Other specified behavioral and emotional disorders with onset usually occurring in childhood and adolescence: Secondary | ICD-10-CM

## 2021-04-29 DIAGNOSIS — E785 Hyperlipidemia, unspecified: Secondary | ICD-10-CM

## 2021-04-29 DIAGNOSIS — J4540 Moderate persistent asthma, uncomplicated: Secondary | ICD-10-CM

## 2021-04-29 DIAGNOSIS — M65341 Trigger finger, right ring finger: Secondary | ICD-10-CM

## 2021-04-29 DIAGNOSIS — F39 Unspecified mood [affective] disorder: Secondary | ICD-10-CM | POA: Diagnosis not present

## 2021-04-29 DIAGNOSIS — E1169 Type 2 diabetes mellitus with other specified complication: Secondary | ICD-10-CM

## 2021-04-29 LAB — BAYER DCA HB A1C WAIVED: HB A1C (BAYER DCA - WAIVED): 6.5 % — ABNORMAL HIGH (ref 4.8–5.6)

## 2021-04-29 MED ORDER — SERTRALINE HCL 100 MG PO TABS: 200.0000 mg | ORAL_TABLET | Freq: Every day | ORAL | 3 refills | Status: DC

## 2021-04-29 MED ORDER — LISDEXAMFETAMINE DIMESYLATE 70 MG PO CAPS: 70.0000 mg | ORAL_CAPSULE | Freq: Every day | ORAL | 0 refills | Status: DC

## 2021-04-29 MED ORDER — TRESIBA FLEXTOUCH 200 UNIT/ML ~~LOC~~ SOPN: 70.0000 [IU] | PEN_INJECTOR | Freq: Every day | SUBCUTANEOUS | 99 refills | Status: DC

## 2021-04-29 MED ORDER — RYBELSUS 14 MG PO TABS: 1.0000 | ORAL_TABLET | Freq: Every day | ORAL | 99 refills | Status: DC

## 2021-04-29 MED ORDER — LAMOTRIGINE 150 MG PO TABS: ORAL_TABLET | ORAL | 3 refills | Status: DC

## 2021-04-29 MED ORDER — MEDROXYPROGESTERONE ACETATE 150 MG/ML IM SUSP: 150.0000 mg | INTRAMUSCULAR | 3 refills | Status: DC

## 2021-04-29 MED ORDER — IBUPROFEN 600 MG PO TABS: 600.0000 mg | ORAL_TABLET | Freq: Every evening | ORAL | 3 refills | Status: DC | PRN

## 2021-04-29 MED ORDER — MONTELUKAST SODIUM 10 MG PO TABS: ORAL_TABLET | ORAL | 3 refills | Status: DC

## 2021-04-29 MED ORDER — LEVOTHYROXINE SODIUM 125 MCG PO TABS: 125.0000 ug | ORAL_TABLET | Freq: Every day | ORAL | 1 refills | Status: DC

## 2021-04-29 MED ORDER — ROSUVASTATIN CALCIUM 40 MG PO TABS
40.0000 mg | ORAL_TABLET | Freq: Every day | ORAL | 3 refills | Status: DC
Start: 2021-04-29 — End: 2022-05-12

## 2021-04-29 MED ORDER — HYDROCHLOROTHIAZIDE 25 MG PO TABS: 25.0000 mg | ORAL_TABLET | Freq: Every day | ORAL | 3 refills | Status: DC

## 2021-04-29 NOTE — Progress Notes (Signed)
Subjective: CC:DM PCP: Raliegh Ip, DO HPI:Judith [name] is a 50 y.o. female presenting to clinic today for:  1. Type 2 Diabetes with hypertension, hyperlipidemia:  Using somewhere between 70 and 75 units of insulin daily.  Compliant with Rybelsus, statin.  Has had some lower extremity edema and is asking to go back on a fluid pill.  Last eye exam: Up-to-date Last foot exam: Up-to-date Last A1c:  Lab Results  Component Value Date   HGBA1C 6.8 (H) 01/22/2021   Nephropathy screen indicated?:  Up-to-date Last flu, zoster and/or pneumovax:  Immunization History  Administered Date(s) Administered   Influenza, Seasonal, Injecte, Preservative Fre 02/09/2018   Influenza,inj,Quad PF,6+ Mos 02/14/2017, 02/20/2019   Influenza-Unspecified 02/05/2020, 01/21/2021   MMR 02/03/1972   Moderna Sars-Covid-2 Vaccination 10/02/2019, 11/01/2019, 04/18/2020, 11/26/2020   PNEUMOCOCCAL CONJUGATE-20 11/26/2020   Pneumococcal Polysaccharide-23 02/20/2019, 11/01/2019   Td 03/03/1985, 06/30/1994   Tdap 12/24/2014, 11/26/2020   Zoster Recombinat (Shingrix) 02/05/2020    ROS: No chest pain, shortness of breath reported  2.  Trigger finger Patient reports trigger finger of the right ring finger.  Asking to see a specialist for this.  Pain is not relieved by NSAID.  Also has some pain at the base of the left MCP of the thumb.  3.  ADHD Reports stability of ADHD symptoms with Vyvanse 70 mg daily.  Reports of excessive dryness, constipation.       ROS: Per HPI  Allergies  Allergen Reactions   Diphenhydramine Other (See Comments)    Hair feels like it is crawling   Morphine Nausea And Vomiting    PCA PUMP/ DRIP -- N/V IV PUSH IN ER NO PROBLEM PER PT.     Prednisone Nausea And Vomiting   Adhesive [Tape]     Paper tape causes irritation    Erythromycin Hives   Metformin And Related     Diarrhea with IR and XR products   Sulfonamide Derivatives Other (See Comments)    Does not take due  to family history   Past Medical History:  Diagnosis Date   Allergy    Anxiety    Arthritis    Asthma    Bipolar 1 disorder (HCC)    Blood transfusion without reported diagnosis    with child birth 16 yrs ago    Bronchitis, chronic (HCC)    Constipation    COVID-19 virus infection 06/21/2020   DDD (degenerative disc disease), cervical    Depression    Diabetes mellitus without complication (HCC)    Dysphagia    Family history of adverse reaction to anesthesia    mother had a hard time waking up after knee surgery in 2021   Gastroparesis    GERD (gastroesophageal reflux disease)    History of kidney stones    History of MRSA infection    Hyperlipemia    Hypothyroid    Migraines    Pneumonia    Sleep apnea    no cpap     Current Outpatient Medications:    Accu-Chek FastClix Lancets MISC, Test blood sugars daily Dx E11.9, Disp: 102 each, Rfl: 3   albuterol (PROVENTIL) (2.5 MG/3ML) 0.083% nebulizer solution, Take 3 mLs (2.5 mg total) by nebulization every 6 (six) hours as needed for wheezing or shortness of breath. Fill upon patient request, Disp: 75 mL, Rfl: 12   albuterol (VENTOLIN HFA) 108 (90 Base) MCG/ACT inhaler, Inhale 2 puffs into the lungs every 6 (six) hours as needed for wheezing or shortness  of breath. Fill upon patient request, Disp: 8 g, Rfl: 1   baclofen (LIORESAL) 10 MG tablet, TAKE 0.5-1 TABLETS BY MOUTH 3 TIMES DAILY AS NEEDED FOR MUSCLE SPASMS., Disp: 90 tablet, Rfl: 12   Blood Glucose Monitoring Suppl (ACCU-CHEK GUIDE ME) w/Device KIT, Use to check BS BID and as needed. DX: E11.9, Disp: 1 kit, Rfl: 0   budesonide-formoterol (SYMBICORT) 160-4.5 MCG/ACT inhaler, Inhale 2 puffs into the lungs 2 (two) times daily., Disp: 1 each, Rfl: 3   Continuous Blood Gluc Sensor (FREESTYLE LIBRE 3 SENSOR) MISC, 1 each by Does not apply route every 14 (fourteen) days. Place 1 sensor on the skin every 14 days. Use to check glucose continuously, Disp: 2 each, Rfl: 11    dexlansoprazole (DEXILANT) 60 MG capsule, Take 1 capsule (60 mg total) by mouth daily., Disp: 90 capsule, Rfl: 3   diclofenac Sodium (VOLTAREN) 1 % GEL, APPLY 4 GRAMS TOPICALLY 4 (FOUR) TIMES DAILY AS NEEDED (KNEE PAIN)., Disp: 400 g, Rfl: 0   dicyclomine (BENTYL) 20 MG tablet, TAKE 1 TABLET (20 MG TOTAL) BY MOUTH EVERY 6 (SIX) HOURS. (Patient taking differently: Take 20 mg by mouth every 6 (six) hours as needed for spasms (abdominal cramps).), Disp: 360 tablet, Rfl: 0   docusate sodium (COLACE) 100 MG capsule, Take 1 capsule (100 mg total) by mouth 2 (two) times daily., Disp: 60 capsule, Rfl: 0   gabapentin (NEURONTIN) 300 MG capsule, 1 capsule (300 mg total) 3 (three) times daily., Disp: 90 capsule, Rfl: 3   glucose blood (ACCU-CHEK GUIDE) test strip, Test blood sugars daily Dx E11.9, Disp: 100 strip, Rfl: 3   hydrOXYzine (ATARAX/VISTARIL) 25 MG tablet, TAKE 1 TABLET BY MOUTH THREE TIMES A DAY (Patient taking differently: Take 25 mg by mouth 3 (three) times daily as needed for itching or anxiety.), Disp: 270 tablet, Rfl: 1   ibuprofen (ADVIL) 600 MG tablet, Take 1 tablet (600 mg total) by mouth at bedtime as needed., Disp: 30 tablet, Rfl: 3   insulin degludec (TRESIBA FLEXTOUCH) 200 UNIT/ML FlexTouch Pen, Inject 66 Units into the skin at bedtime., Disp: 9 mL, Rfl: 1   Insulin Pen Needle (B-D UF III MINI PEN NEEDLES) 31G X 5 MM MISC, use as directed once daily Dx E11.8, Disp: 100 each, Rfl: 4   lamoTRIgine (LAMICTAL) 150 MG tablet, TAKE 1 TABLET BY MOUTH TWICE A DAY (Patient taking differently: Take 300 mg by mouth daily.), Disp: 180 tablet, Rfl: 0   levothyroxine (SYNTHROID) 125 MCG tablet, Take 1 tablet (125 mcg total) by mouth daily. (Patient taking differently: Take 125 mcg by mouth at bedtime.), Disp: 90 tablet, Rfl: 1   linaclotide (LINZESS) 72 MCG capsule, Take 1 capsule (72 mcg total) by mouth daily before breakfast. (Patient taking differently: Take 72 mcg by mouth daily as needed  (constipation).), Disp: 30 capsule, Rfl: 2   lisdexamfetamine (VYVANSE) 70 MG capsule, Take 1 capsule (70 mg total) by mouth daily., Disp: 30 capsule, Rfl: 0   lisdexamfetamine (VYVANSE) 70 MG capsule, Take 1 capsule (70 mg total) by mouth daily., Disp: 30 capsule, Rfl: 0   lisdexamfetamine (VYVANSE) 70 MG capsule, Take 1 capsule (70 mg total) by mouth daily., Disp: 30 capsule, Rfl: 0   medroxyPROGESTERone (DEPO-PROVERA) 150 MG/ML injection, Inject 1 mL (150 mg total) into the muscle every 3 (three) months., Disp: 1 mL, Rfl: 3   Mepolizumab (NUCALA) 100 MG/ML SOAJ, Inject 1 mL (100 mg total) into the skin every 28 (twenty-eight) days. Deliver to patient's  home, Disp: 3 mL, Rfl: 1   montelukast (SINGULAIR) 10 MG tablet, TAKE ONE TAB BY MOUTH AT BEDTIME (Patient taking differently: Take 10 mg by mouth at bedtime.), Disp: 90 tablet, Rfl: 3   nystatin cream (MYCOSTATIN), Apply 1 application topically 2 (two) times daily. x2 weeks (Patient taking differently: Apply 1 application topically 2 (two) times daily as needed (rash).), Disp: 60 g, Rfl: 0   ondansetron (ZOFRAN-ODT) 4 MG disintegrating tablet, Take 1-2 tablets (4-8 mg total) by mouth every 8 (eight) hours as needed for nausea or vomiting., Disp: 30 tablet, Rfl: 0   pioglitazone (ACTOS) 45 MG tablet, TAKE 1 TABLET BY MOUTH EVERY DAY, Disp: 90 tablet, Rfl: 0   promethazine (PHENERGAN) 25 MG tablet, Take 1 tablet (25 mg total) by mouth every 6 (six) hours as needed for nausea or vomiting., Disp: 30 tablet, Rfl: 0   rosuvastatin (CRESTOR) 40 MG tablet, Take 1 tablet (40 mg total) by mouth daily. (Patient taking differently: Take 40 mg by mouth at bedtime.), Disp: 90 tablet, Rfl: 3   Semaglutide (RYBELSUS) 14 MG TABS, Take 1 tablet by mouth daily before breakfast. (Patient taking differently: Take 14 mg by mouth daily before breakfast.), Disp: 30 tablet, Rfl: prn   sertraline (ZOLOFT) 100 MG tablet, Take 2 tablets (200 mg total) by mouth daily. (Patient  taking differently: Take 200 mg by mouth at bedtime.), Disp: 180 tablet, Rfl: 3  Current Facility-Administered Medications:    medroxyPROGESTERone (DEPO-PROVERA) injection 150 mg, 150 mg, Intramuscular, Q90 days, Zarie Kosiba M, DO, 150 mg at 02/13/21 1100 Social History   Socioeconomic History   Marital status: Divorced    Spouse name: Not on file   Number of children: Not on file   Years of education: Not on file   Highest education level: Not on file  Occupational History   Not on file  Tobacco Use   Smoking status: Former    Packs/day: 1.00    Years: 5.00    Pack years: 5.00    Types: Cigarettes    Quit date: 04/04/1993    Years since quitting: 28.0   Smokeless tobacco: Never   Tobacco comments:    smoked when went out with friends/social smoker. pack would last a week.  Vaping Use   Vaping Use: Former  Substance and Sexual Activity   Alcohol use: No   Drug use: No   Sexual activity: Yes    Birth control/protection: I.U.D.  Other Topics Concern   Not on file  Social History Narrative   Not on file   Social Determinants of Health   Financial Resource Strain: Not on file  Food Insecurity: Not on file  Transportation Needs: Not on file  Physical Activity: Not on file  Stress: Not on file  Social Connections: Not on file  Intimate Partner Violence: Not on file   Family History  Problem Relation Age of Onset   Mental illness Other    Diabetes Other    Healthy Mother    Cancer Father    Non-Hodgkin's lymphoma Father    Breast cancer Paternal Grandmother    Lung cancer Paternal Grandmother    Esophageal cancer Other    Colon cancer Neg Hx    Stomach cancer Neg Hx    Pancreatic cancer Neg Hx    Colon polyps Neg Hx     Objective: Office vital signs reviewed. BP 135/90    Pulse 94    Temp 97.9 F (36.6 C)    Ht  5\' 8"  (1.727 m)    Wt (!) 302 lb 9.6 oz (137.3 kg)    SpO2 96%    BMI 46.01 kg/m   Physical Examination:  General: Awake, alert, morbidly  obese, No acute distress HEENT: Normal; no exophthalmos or goiter Cardio: regular rate and rhythm, S1S2 heard, no murmurs appreciated Pulm: clear to auscultation bilaterally, no wheezes, rhonchi or rales; normal work of breathing on room air Extremities: warm, well perfused, No edema, cyanosis or clubbing; +2 pulses bilaterally MSK: normal gait and station  Right hand: No significant palpable nodule noted along the pulley system of the ring finger  Left hand: No gross swelling, erythema or warmth of the left MCP of the first digit Psych: Mood stable, speech normal.  Depression screen [facility] 2/9 04/29/2021 04/16/2021 01/29/2021  Decreased Interest 1 1 1   Down, Depressed, Hopeless 1 1 1   PHQ - 2 Score 2 2 2   Altered sleeping 0 0 2  Tired, decreased energy 2 1 2   Change in appetite 0 0 0  Feeling bad or failure about yourself  0 0 0  Trouble concentrating 0 1 0  Moving slowly or fidgety/restless 0 0 0  Suicidal thoughts 0 0 0  PHQ-9 Score 4 4 6   Difficult doing work/chores Not difficult at all Somewhat difficult Somewhat difficult  Some recent data might be hidden   GAD 7 : Generalized Anxiety Score 04/29/2021 04/16/2021 01/29/2021 11/06/2020  Nervous, Anxious, on Edge 1 1 2  0  Control/stop worrying 0 0 0 0  Worry too much - different things 0 0 0 0  Trouble relaxing 1 1 2  0  Restless 0 0 0 0  Easily annoyed or irritable 1 2 2 1   Afraid - awful might happen 0 0 0 0  Total GAD 7 Score 3 4 6 1   Anxiety Difficulty Somewhat difficult Somewhat difficult Somewhat difficult Not difficult at all   Assessment/ Plan: 50 y.o. female   Type 2 diabetes mellitus with other specified complication, without long-term current use of insulin (HCC) - Plan: Bayer DCA Hb A1c Waived, insulin degludec (TRESIBA FLEXTOUCH) 200 UNIT/ML FlexTouch Pen  Hypertension associated with diabetes ([place]) - Plan: hydrochlorothiazide (HYDRODIURIL) 25 MG tablet  Hyperlipidemia associated with type 2 diabetes mellitus ([place])  - Plan: rosuvastatin (CRESTOR) 40 MG tablet  Morbid obesity ([place])  Acquired hypothyroidism - Plan: levothyroxine (SYNTHROID) 125 MCG tablet  Attention deficit disorder (ADD) without hyperactivity - Plan: lisdexamfetamine (VYVANSE) 70 MG capsule, lisdexamfetamine (VYVANSE) 70 MG capsule, lisdexamfetamine (VYVANSE) 70 MG capsule  Episodic mood disorder (HCC), Chronic - Plan: lamoTRIgine (LAMICTAL) 150 MG tablet, sertraline (ZOLOFT) 100 MG tablet  Moderate persistent asthma without complication - Plan: montelukast (SINGULAIR) 10 MG tablet  Trigger ring finger of right hand  Sugar under control.  No changes.  Refills have been sent  Not yet due for fasting lipid.  Continue statin.  Renewal sent  Resume use of HCTZ.  Blood pressure was borderline today.  Should also help with edema.  No significant edema on exam though today  Continue weight loss.  She should be followed with bariatric clinic soon  Not yet due for thyroid labs.  Continue Synthroid  ADHD stable.  Postdated prescription for Vyvanse sent.  narcotic database reviewed and there were no red flags.  She is up-to-date on UDS and controlled substance contract  Mood disorder is stable.  Continue Lamictal and sertraline.  She will continue follow-up with pulmonologist.  Currently treated with Nucala for breathing.  Singulair also  renewed  She will be scheduled with orthopedics on January 9 for evaluation of trigger finger and MCP arthritis.  May benefit from corticosteroid injection  No orders of the defined types were placed in this encounter.  No orders of the defined types were placed in this encounter.    Janora Norlander, DO [place] [phone]

## 2021-05-01 ENCOUNTER — Telehealth: Payer: Self-pay

## 2021-05-01 NOTE — Telephone Encounter (Signed)
Judith [name] (Judith [name]) Rx #: [id number] Rybelsus 14MG  tablets   Form [facility] Electronic [place] Form [phone] NCPDP) Created 2 days ago Sent to Plan 4 minutes ago Plan Response 4 minutes ago Submit Clinical Questions less than a minute ago Determination Wait for Determination Please wait for [facility] to return a determination.

## 2021-05-02 ENCOUNTER — Telehealth: Payer: Self-pay

## 2021-05-02 NOTE — Telephone Encounter (Signed)
Rybelsus 14mg  tab- 30 day supply approvd from 05/01/21-05/01/2022  [facility] in [place] made aware

## 2021-05-06 ENCOUNTER — Ambulatory Visit (INDEPENDENT_AMBULATORY_CARE_PROVIDER_SITE_OTHER): Payer: Medicaid Other

## 2021-05-06 DIAGNOSIS — Z3042 Encounter for surveillance of injectable contraceptive: Secondary | ICD-10-CM | POA: Diagnosis not present

## 2021-05-06 NOTE — Progress Notes (Signed)
PT TOLERATED DEPO WELL

## 2021-05-14 ENCOUNTER — Other Ambulatory Visit (HOSPITAL_COMMUNITY): Payer: Self-pay

## 2021-05-21 DIAGNOSIS — H5213 Myopia, bilateral: Secondary | ICD-10-CM | POA: Diagnosis not present

## 2021-05-26 ENCOUNTER — Ambulatory Visit: Payer: Medicaid Other | Admitting: Orthopedic Surgery

## 2021-05-26 ENCOUNTER — Encounter: Payer: Self-pay | Admitting: Orthopedic Surgery

## 2021-05-26 VITALS — BP 167/101 | HR 90 | Ht 67.5 in | Wt 302.0 lb

## 2021-05-26 DIAGNOSIS — M65341 Trigger finger, right ring finger: Secondary | ICD-10-CM

## 2021-05-26 DIAGNOSIS — M65312 Trigger thumb, left thumb: Secondary | ICD-10-CM
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
## 2021-05-26 NOTE — Patient Instructions (Signed)
Will plan to proceed with right ring finger trigger release, tentatively 1/30.  Will discuss with Dr. Lajuana Ripple to confirm [facility] will contact you the week before surgery.  We will see you in clinic about 2 weeks after surgery  Someone from my office will contact you once we have confirmed a date for surgery

## 2021-05-28 ENCOUNTER — Ambulatory Visit: Payer: Self-pay | Admitting: Orthopedic Surgery

## 2021-05-28 DIAGNOSIS — Z01818 Encounter for other preprocedural examination: Secondary | ICD-10-CM

## 2021-06-03 ENCOUNTER — Telehealth: Payer: Self-pay | Admitting: Pharmacist

## 2021-06-03 MED ORDER — FREESTYLE LIBRE 3 SENSOR MISC: 2.0000 | 5 refills | Status: DC

## 2021-06-03 NOTE — Telephone Encounter (Signed)
[place] 3 sent in --medicaid will cover with PA

## 2021-06-04 ENCOUNTER — Encounter: Payer: Self-pay | Admitting: Gastroenterology

## 2021-06-05 ENCOUNTER — Telehealth: Payer: Self-pay

## 2021-06-05 NOTE — Telephone Encounter (Signed)
[facility] - PA Case ID: [id number] - Rx #: [id number]  Outcome:  Approved PA Case: [id number], Status: Approved, Coverage Starts on: 06/05/2021 12:00:00 AM, Coverage Ends on: 06/05/2022

## 2021-06-10 ENCOUNTER — Telehealth: Payer: Self-pay | Admitting: Orthopedic Surgery

## 2021-06-10 NOTE — Telephone Encounter (Signed)
Patient called regarding her surgery scheduled for 06/16/21 for the right hand - states she thinks she may need to have the left hand done first. Please call [phone]

## 2021-06-11 NOTE — Patient Instructions (Signed)
Judith [name]  06/11/2021     @PREFPERIOPPHARMACY @   Your procedure is scheduled on  06/16/2021.   Report to [facility] at  [phone] A.M.   Call this number if you have problems the morning of surgery:  [phone]   Remember:  Do not eat after midnight.   Your last dose of semaglutide should have been on 06/09/2021.    Take 1/2 of your usual insulin dose the night before your procedure.   You may drink clear liquids until  0330 AM.      At 0330 am, drink your carb drink. You can have nothing else to drink after this.    Use your nebulizer and your inhaler before you come and bring your rescue inhaler with you.      DO NOT take any medications for diabetes the morning of your procedure.     Bring extra [place] supplies with you incase it becomes dislodged during your procedure.   Take these medicines the morning of surgery with A SIP OF WATER           baclofen(if needed), dexilant, gabapentin, lamicatal, levothyroxine, vyvanse, zofran(if needed), zoloft.     Do not wear jewelry, make-up or nail polish.  Do not wear lotions, powders, or perfumes, or deodorant.  Do not shave 48 hours prior to surgery.  Men may shave face and neck.  Do not bring valuables to the hospital.  [facility] is not responsible for any belongings or valuables.  Contacts, dentures or bridgework may not be worn into surgery.  Leave your suitcase in the car.  After surgery it may be brought to your room.  For patients admitted to the hospital, discharge time will be determined by your treatment team.  Patients discharged the day of surgery will not be allowed to drive home and must have someone with them for 24 hours.    Special instructions:   DO NOT smoke tobacco or vape fore 24 hours before your procedure.  Please read over the following fact sheets that you were given. Coughing and Deep Breathing, Surgical Site Infection Prevention, Anesthesia Post-op Instructions, and Care and  Recovery After Surgery                                       Incision Care, Adult An incision is a cut that a doctor makes in your skin for surgery. Most times, these cuts are closed after surgery. Your cut from surgery may be closed with: Stitches (sutures). Staples. Skin glue. Skin tape (adhesive) strips. You may need to go back to your doctor to have stitches or staples taken out. This may happen many days or many weeks after your surgery. You need to take good care of your cut so it does not get infected. Follow instructions from your doctor about how to care for your cut. Supplies needed: Soap and water. A clean hand towel. Wound cleanser. A clean bandage (dressing), if needed. Cream or ointment, if told by your doctor. Clean gauze. How to care for your cut from surgery Cleaning your cut Ask your doctor how to clean your cut. You may need to: Wear medical gloves. Use mild soap and water, or a wound cleanser. Use a clean gauze to pat your cut dry after you clean it. Changing your bandage Wash your hands with soap and water for at  least 20 seconds before and after you change your bandage. If you cannot use soap and water, use hand sanitizer. Do not usedisinfectants or antiseptics, such as rubbing alcohol, to clean your wound unless told by your doctor. Change your bandage as told by your doctor. Leavestitches or skin glue in place for at least 2 weeks. Leave tape strips alone unless you are told to take them off. You may trim the edges of the tape strips if they curl up. Put a cream or ointment on your cut. Do this only as told. Cover your cut with a clean bandage. Ask your doctor when you can leave your cut uncovered. Checking for infection Check your cut area every day for signs of infection. Check for: More redness, swelling, or pain. More fluid or blood. New warmth. Hardness or a new rash around the incision. Pus or a bad smell.  Follow these instructions at  home Medicines Take over-the-counter and prescription medicines only as told by your doctor. If you were prescribed an antibiotic medicine, cream, or ointment, use it as told by your doctor. Do not stop using the antibiotic even if you start to feel better. Eating and drinking Eat foods that have a lot of certain nutrients, such as protein, vitamin A, and vitamin C. These foods help your cut heal. Foods rich in protein include meat, fish, eggs, dairy, beans, nuts, and protein drinks. Foods rich in vitamin A include carrots and dark green, leafy vegetables. Foods rich in vitamin C include citrus fruits, tomatoes, broccoli, and peppers. Drink enough fluid to keep your pee (urine) pale yellow. General instructions  Do not take baths, swim, or use a hot tub. Ask your doctor about taking showers or sponge baths. Limit movement around your cut. This helps with healing. Try not to strain, lift, or exercise for the first 2 weeks, or for as long as told by your doctor. Return to your normal activities as told by your doctor. Ask your doctor what activities are safe for you. Do not scratch, scrub, or pick at your cut. Keep it covered as told by your doctor. Protect your cut from the sun when you are outside for the first 6 months, or for as long as told by your doctor. Cover up the scar area or put on sunscreen that has an SPF of at least 30. Do not use any products that contain nicotine or tobacco, such as cigarettes, e-cigarettes, and chewing tobacco. These can delay cut healing. If you need help quitting, ask your doctor. Keep all follow-up visits. Contact a doctor if: You have any of these signs of infection around your cut: More redness, swelling, or pain. More fluid or blood. New warmth or hardness. Pus or a bad smell. A new rash. You have a fever. You feel like you may vomit (nauseous). You vomit. You are dizzy. Your stitches, staples, skin glue, or tape strips come undone. Your cut gets  bigger. You have a fever. Get help right away if: Your cut bleeds through your bandage, and bleeding does not stop with gentle pressure. Your cut opens up and comes apart. These symptoms may be an emergency. Do not wait to see if the symptoms will go away. Get medical help right away. Call your local emergency services (911 in the [place] Do not drive yourself to the hospital. Summary Follow instructions from your doctor about how to care for your cut. Wash your hands with soap and water for at least 20 seconds before and after  you change your bandage. If you cannot use soap and water, use hand sanitizer. Check your cut area every day for signs of infection. Keep all follow-up visits. This information is not intended to replace advice given to you by your health care provider. Make sure you discuss any questions you have with your health care provider. Document Revised: 08/05/2020 Document Reviewed: 08/05/2020 Elsevier Patient Education  [place] Anesthesia, Adult, Care After This sheet gives you information about how to care for yourself after your procedure. Your health care provider may also give you more specific instructions. If you have problems or questions, contact your health care provider. What can I expect after the procedure? After the procedure, the following side effects are common: Pain or discomfort at the IV site. Nausea. Vomiting. Sore throat. Trouble concentrating. Feeling cold or chills. Feeling weak or tired. Sleepiness and fatigue. Soreness and body aches. These side effects can affect parts of the body that were not involved in surgery. Follow these instructions at home: For the time period you were told by your health care provider:  Rest. Do not participate in activities where you could fall or become injured. Do not drive or use machinery. Do not drink alcohol. Do not take sleeping pills or medicines that cause drowsiness. Do not make  important decisions or sign legal documents. Do not take care of children on your own. Eating and drinking Follow any instructions from your health care provider about eating or drinking restrictions. When you feel hungry, start by eating small amounts of foods that are soft and easy to digest (bland), such as toast. Gradually return to your regular diet. Drink enough fluid to keep your urine pale yellow. If you vomit, rehydrate by drinking water, juice, or clear broth. General instructions If you have sleep apnea, surgery and certain medicines can increase your risk for breathing problems. Follow instructions from your health care provider about wearing your sleep device: Anytime you are sleeping, including during daytime naps. While taking prescription pain medicines, sleeping medicines, or medicines that make you drowsy. Have a responsible adult stay with you for the time you are told. It is important to have someone help care for you until you are awake and alert. Return to your normal activities as told by your health care provider. Ask your health care provider what activities are safe for you. Take over-the-counter and prescription medicines only as told by your health care provider. If you smoke, do not smoke without supervision. Keep all follow-up visits as told by your health care provider. This is important. Contact a health care provider if: You have nausea or vomiting that does not get better with medicine. You cannot eat or drink without vomiting. You have pain that does not get better with medicine. You are unable to pass urine. You develop a skin rash. You have a fever. You have redness around your IV site that gets worse. Get help right away if: You have difficulty breathing. You have chest pain. You have blood in your urine or stool, or you vomit blood. Summary After the procedure, it is common to have a sore throat or nausea. It is also common to feel tired. Have a  responsible adult stay with you for the time you are told. It is important to have someone help care for you until you are awake and alert. When you feel hungry, start by eating small amounts of foods that are soft and easy to digest (bland), such as toast.  Gradually return to your regular diet. Drink enough fluid to keep your urine pale yellow. Return to your normal activities as told by your health care provider. Ask your health care provider what activities are safe for you. This information is not intended to replace advice given to you by your health care provider. Make sure you discuss any questions you have with your health care provider. Document Revised: 01/18/2020 Document Reviewed: 08/17/2019 Elsevier Patient Education  2022 [employer]. How to Use Chlorhexidine for Bathing Chlorhexidine gluconate (CHG) is a germ-killing (antiseptic) solution that is used to clean the skin. It can get rid of the bacteria that normally live on the skin and can keep them away for about 24 hours. To clean your skin with CHG, you may be given: A CHG solution to use in the shower or as part of a sponge bath. A prepackaged cloth that contains CHG. Cleaning your skin with CHG may help lower the risk for infection: While you are staying in the intensive care unit of the hospital. If you have a vascular access, such as a central line, to provide short-term or long-term access to your veins. If you have a catheter to drain urine from your bladder. If you are on a ventilator. A ventilator is a machine that helps you breathe by moving air in and out of your lungs. After surgery. What are the risks? Risks of using CHG include: A skin reaction. Hearing loss, if CHG gets in your ears and you have a perforated eardrum. Eye injury, if CHG gets in your eyes and is not rinsed out. The CHG product catching fire. Make sure that you avoid smoking and flames after applying CHG to your skin. Do not use CHG: If you have  a chlorhexidine allergy or have previously reacted to chlorhexidine. On babies younger than 72 months of age. How to use CHG solution Use CHG only as told by your health care provider, and follow the instructions on the label. Use the full amount of CHG as directed. Usually, this is one bottle. During a shower Follow these steps when using CHG solution during a shower (unless your health care provider gives you different instructions): Start the shower. Use your normal soap and shampoo to wash your face and hair. Turn off the shower or move out of the shower stream. Pour the CHG onto a clean washcloth. Do not use any type of brush or rough-edged sponge. Starting at your neck, lather your body down to your toes. Make sure you follow these instructions: If you will be having surgery, pay special attention to the part of your body where you will be having surgery. Scrub this area for at least 1 minute. Do not use CHG on your head or face. If the solution gets into your ears or eyes, rinse them well with water. Avoid your genital area. Avoid any areas of skin that have broken skin, cuts, or scrapes. Scrub your back and under your arms. Make sure to wash skin folds. Let the lather sit on your skin for 1-2 minutes or as long as told by your health care provider. Thoroughly rinse your entire body in the shower. Make sure that all body creases and crevices are rinsed well. Dry off with a clean towel. Do not put any substances on your body afterward--such as powder, lotion, or perfume--unless you are told to do so by your health care provider. Only use lotions that are recommended by the manufacturer. Put on clean clothes or  pajamas. If it is the night before your surgery, sleep in clean sheets.  During a sponge bath Follow these steps when using CHG solution during a sponge bath (unless your health care provider gives you different instructions): Use your normal soap and shampoo to wash your face and  hair. Pour the CHG onto a clean washcloth. Starting at your neck, lather your body down to your toes. Make sure you follow these instructions: If you will be having surgery, pay special attention to the part of your body where you will be having surgery. Scrub this area for at least 1 minute. Do not use CHG on your head or face. If the solution gets into your ears or eyes, rinse them well with water. Avoid your genital area. Avoid any areas of skin that have broken skin, cuts, or scrapes. Scrub your back and under your arms. Make sure to wash skin folds. Let the lather sit on your skin for 1-2 minutes or as long as told by your health care provider. Using a different clean, wet washcloth, thoroughly rinse your entire body. Make sure that all body creases and crevices are rinsed well. Dry off with a clean towel. Do not put any substances on your body afterward--such as powder, lotion, or perfume--unless you are told to do so by your health care provider. Only use lotions that are recommended by the manufacturer. Put on clean clothes or pajamas. If it is the night before your surgery, sleep in clean sheets. How to use CHG prepackaged cloths Only use CHG cloths as told by your health care provider, and follow the instructions on the label. Use the CHG cloth on clean, dry skin. Do not use the CHG cloth on your head or face unless your health care provider tells you to. When washing with the CHG cloth: Avoid your genital area. Avoid any areas of skin that have broken skin, cuts, or scrapes. Before surgery Follow these steps when using a CHG cloth to clean before surgery (unless your health care provider gives you different instructions): Using the CHG cloth, vigorously scrub the part of your body where you will be having surgery. Scrub using a back-and-forth motion for 3 minutes. The area on your body should be completely wet with CHG when you are done scrubbing. Do not rinse. Discard the cloth and  let the area air-dry. Do not put any substances on the area afterward, such as powder, lotion, or perfume. Put on clean clothes or pajamas. If it is the night before your surgery, sleep in clean sheets.  For general bathing Follow these steps when using CHG cloths for general bathing (unless your health care provider gives you different instructions). Use a separate CHG cloth for each area of your body. Make sure you wash between any folds of skin and between your fingers and toes. Wash your body in the following order, switching to a new cloth after each step: The front of your neck, shoulders, and chest. Both of your arms, under your arms, and your hands. Your stomach and groin area, avoiding the genitals. Your right leg and foot. Your left leg and foot. The back of your neck, your back, and your buttocks. Do not rinse. Discard the cloth and let the area air-dry. Do not put any substances on your body afterward--such as powder, lotion, or perfume--unless you are told to do so by your health care provider. Only use lotions that are recommended by the manufacturer. Put on clean clothes or pajamas. Contact  a health care provider if: Your skin gets irritated after scrubbing. You have questions about using your solution or cloth. You swallow any chlorhexidine. Call your local poison control center (1-(574)097-6284 in the [place] Get help right away if: Your eyes itch badly, or they become very red or swollen. Your skin itches badly and is red or swollen. Your hearing changes. You have trouble seeing. You have swelling or tingling in your mouth or throat. You have trouble breathing. These symptoms may represent a serious problem that is an emergency. Do not wait to see if the symptoms will go away. Get medical help right away. Call your local emergency services (911 in the [place] Do not drive yourself to the hospital. Summary Chlorhexidine gluconate (CHG) is a germ-killing (antiseptic) solution  that is used to clean the skin. Cleaning your skin with CHG may help to lower your risk for infection. You may be given CHG to use for bathing. It may be in a bottle or in a prepackaged cloth to use on your skin. Carefully follow your health care provider's instructions and the instructions on the product label. Do not use CHG if you have a chlorhexidine allergy. Contact your health care provider if your skin gets irritated after scrubbing. This information is not intended to replace advice given to you by your health care provider. Make sure you discuss any questions you have with your health care provider. Document Revised: 07/15/2020 Document Reviewed: 07/15/2020 Elsevier Patient Education  2022 [employer].

## 2021-06-11 NOTE — Telephone Encounter (Signed)
Spoke with pt who verified she would like her LEFT hand completed first.

## 2021-06-13 ENCOUNTER — Encounter (HOSPITAL_COMMUNITY)
Admission: RE | Admit: 2021-06-13 | Discharge: 2021-06-13 | Disposition: A | Discharge status: Home / Self Care | Payer: Medicaid Other | Source: Ambulatory Visit | Attending: Orthopedic Surgery | Admitting: Orthopedic Surgery

## 2021-06-13 ENCOUNTER — Ambulatory Visit: Payer: Medicaid Other | Admitting: Family Medicine

## 2021-06-13 DIAGNOSIS — Z01812 Encounter for preprocedural laboratory examination: Secondary | ICD-10-CM | POA: Diagnosis not present

## 2021-06-13 DIAGNOSIS — Z01818 Encounter for other preprocedural examination: Secondary | ICD-10-CM

## 2021-06-13 LAB — CBC
HCT: 41.8 % (ref 36.0–46.0)
Hemoglobin: 13.1 g/dL (ref 12.0–15.0)
MCH: 26.5 pg (ref 26.0–34.0)
MCHC: 31.3 g/dL (ref 30.0–36.0)
MCV: 84.6 fL (ref 80.0–100.0)
Platelets: 328 10*3/uL (ref 150–400)
RBC: 4.94 MIL/uL (ref 3.87–5.11)
RDW: 14.1 % (ref 11.5–15.5)
WBC: 8.8 10*3/uL (ref 4.0–10.5)
nRBC: 0 % (ref 0.0–0.2)

## 2021-06-13 LAB — BASIC METABOLIC PANEL
Anion gap: 6 (ref 5–15)
BUN: 14 mg/dL (ref 6–20)
CO2: 25 mmol/L (ref 22–32)
Calcium: 8.2 mg/dL — ABNORMAL LOW (ref 8.9–10.3)
Chloride: 103 mmol/L (ref 98–111)
Creatinine, Ser: 0.92 mg/dL (ref 0.44–1.00)
GFR, Estimated: >60 mL/min (ref >60)
Glucose, Bld: 263 mg/dL — ABNORMAL HIGH (ref 70–99)
Potassium: 3.3 mmol/L — ABNORMAL LOW (ref 3.5–5.1)
Sodium: 134 mmol/L — ABNORMAL LOW (ref 135–145)

## 2021-06-16 ENCOUNTER — Other Ambulatory Visit: Payer: Self-pay

## 2021-06-16 ENCOUNTER — Ambulatory Visit (HOSPITAL_COMMUNITY)
Admission: RE | Admit: 2021-06-16 | Discharge: 2021-06-16 | Disposition: A | Discharge status: Home / Self Care | Payer: Medicaid Other | Attending: Orthopedic Surgery | Admitting: Orthopedic Surgery

## 2021-06-16 ENCOUNTER — Telehealth: Payer: Self-pay | Admitting: Family Medicine

## 2021-06-16 ENCOUNTER — Telehealth (INDEPENDENT_AMBULATORY_CARE_PROVIDER_SITE_OTHER): Payer: Medicaid Other | Admitting: Family Medicine

## 2021-06-16 ENCOUNTER — Ambulatory Visit (HOSPITAL_COMMUNITY): Payer: Medicaid Other | Admitting: Anesthesiology

## 2021-06-16 ENCOUNTER — Encounter (HOSPITAL_COMMUNITY)
Admission: RE | Disposition: A | Discharge status: Home / Self Care | Payer: Self-pay | Source: Home / Self Care | Attending: Orthopedic Surgery

## 2021-06-16 DIAGNOSIS — Z7984 Long term (current) use of oral hypoglycemic drugs: Secondary | ICD-10-CM | POA: Diagnosis not present

## 2021-06-16 DIAGNOSIS — G4730 Sleep apnea, unspecified: Secondary | ICD-10-CM | POA: Insufficient documentation

## 2021-06-16 DIAGNOSIS — Z981 Arthrodesis status: Secondary | ICD-10-CM | POA: Diagnosis not present

## 2021-06-16 DIAGNOSIS — I10 Essential (primary) hypertension: Secondary | ICD-10-CM | POA: Diagnosis not present

## 2021-06-16 DIAGNOSIS — E890 Postprocedural hypothyroidism: Secondary | ICD-10-CM | POA: Diagnosis not present

## 2021-06-16 DIAGNOSIS — E039 Hypothyroidism, unspecified: Secondary | ICD-10-CM | POA: Insufficient documentation

## 2021-06-16 DIAGNOSIS — Z794 Long term (current) use of insulin: Secondary | ICD-10-CM | POA: Insufficient documentation

## 2021-06-16 DIAGNOSIS — G8918 Other acute postprocedural pain: Secondary | ICD-10-CM

## 2021-06-16 DIAGNOSIS — M65341 Trigger finger, right ring finger: Secondary | ICD-10-CM | POA: Insufficient documentation

## 2021-06-16 DIAGNOSIS — J449 Chronic obstructive pulmonary disease, unspecified: Secondary | ICD-10-CM | POA: Insufficient documentation

## 2021-06-16 DIAGNOSIS — Z87891 Personal history of nicotine dependence: Secondary | ICD-10-CM | POA: Insufficient documentation

## 2021-06-16 DIAGNOSIS — M65312 Trigger thumb, left thumb: Secondary | ICD-10-CM | POA: Insufficient documentation

## 2021-06-16 DIAGNOSIS — Z9889 Other specified postprocedural states: Secondary | ICD-10-CM

## 2021-06-16 DIAGNOSIS — E1143 Type 2 diabetes mellitus with diabetic autonomic (poly)neuropathy: Secondary | ICD-10-CM | POA: Diagnosis not present

## 2021-06-16 DIAGNOSIS — F319 Bipolar disorder, unspecified: Secondary | ICD-10-CM | POA: Insufficient documentation

## 2021-06-16 DIAGNOSIS — F419 Anxiety disorder, unspecified: Secondary | ICD-10-CM | POA: Diagnosis not present

## 2021-06-16 HISTORY — PX: TRIGGER FINGER RELEASE: SHX641

## 2021-06-16 LAB — GLUCOSE, CAPILLARY: Glucose-Capillary: 209 mg/dL — ABNORMAL HIGH (ref 70–99)

## 2021-06-16 SURGERY — RELEASE, A1 PULLEY, FOR TRIGGER FINGER
Anesthesia: General | Site: Thumb | Laterality: Left

## 2021-06-16 MED ORDER — LIDOCAINE HCL (PF) 2 % IJ SOLN
INTRAMUSCULAR | Status: AC
  Filled 2021-06-16: qty 5

## 2021-06-16 MED ORDER — ONDANSETRON HCL 4 MG/2ML IJ SOLN: 4.0000 mg | Freq: Once | INTRAMUSCULAR | Status: DC | PRN

## 2021-06-16 MED ORDER — HYDROCODONE-ACETAMINOPHEN 5-325 MG PO TABS: 1.0000 | ORAL_TABLET | ORAL | 0 refills | Status: DC | PRN

## 2021-06-16 MED ORDER — BUPIVACAINE HCL (PF) 0.5 % IJ SOLN
INTRAMUSCULAR | Status: AC
  Filled 2021-06-16: qty 30

## 2021-06-16 MED ORDER — DEXAMETHASONE SODIUM PHOSPHATE 10 MG/ML IJ SOLN
INTRAMUSCULAR | Status: AC
  Filled 2021-06-16: qty 1

## 2021-06-16 MED ORDER — BUPIVACAINE HCL (PF) 0.5 % IJ SOLN
INTRAMUSCULAR | Status: DC | PRN
  Administered 2021-06-16: 7 mL

## 2021-06-16 MED ORDER — ONDANSETRON HCL 4 MG/2ML IJ SOLN
INTRAMUSCULAR | Status: AC
  Filled 2021-06-16: qty 2

## 2021-06-16 MED ORDER — CEFAZOLIN SODIUM-DEXTROSE 2-4 GM/100ML-% IV SOLN
INTRAVENOUS | Status: AC
  Filled 2021-06-16: qty 100

## 2021-06-16 MED ORDER — CHLORHEXIDINE GLUCONATE 0.12 % MT SOLN
OROMUCOSAL | Status: AC
  Filled 2021-06-16: qty 15

## 2021-06-16 MED ORDER — DEXAMETHASONE SODIUM PHOSPHATE 4 MG/ML IJ SOLN
INTRAMUSCULAR | Status: DC | PRN
  Administered 2021-06-16: 4 mg via INTRAVENOUS

## 2021-06-16 MED ORDER — PROPOFOL 10 MG/ML IV BOLUS
INTRAVENOUS | Status: AC
  Filled 2021-06-16: qty 20

## 2021-06-16 MED ORDER — ORAL CARE MOUTH RINSE: 15.0000 mL | Freq: Once | OROMUCOSAL | Status: AC

## 2021-06-16 MED ORDER — CEFAZOLIN IN SODIUM CHLORIDE 3-0.9 GM/100ML-% IV SOLN: 3.0000 g | INTRAVENOUS | Status: DC

## 2021-06-16 MED ORDER — MIDAZOLAM HCL 5 MG/5ML IJ SOLN
INTRAMUSCULAR | Status: DC | PRN
Start: 2021-06-16 — End: 2021-06-16
  Administered 2021-06-16: 2 mg via INTRAVENOUS

## 2021-06-16 MED ORDER — FENTANYL CITRATE (PF) 100 MCG/2ML IJ SOLN
INTRAMUSCULAR | Status: DC | PRN
  Administered 2021-06-16: 100 ug via INTRAVENOUS

## 2021-06-16 MED ORDER — PROPOFOL 10 MG/ML IV BOLUS
INTRAVENOUS | Status: DC | PRN
  Administered 2021-06-16: 200 mg via INTRAVENOUS

## 2021-06-16 MED ORDER — CHLORHEXIDINE GLUCONATE 0.12 % MT SOLN
15.0000 mL | Freq: Once | OROMUCOSAL | Status: AC
  Administered 2021-06-16: 15 mL via OROMUCOSAL

## 2021-06-16 MED ORDER — SUCCINYLCHOLINE CHLORIDE 200 MG/10ML IV SOSY
PREFILLED_SYRINGE | INTRAVENOUS | Status: DC | PRN
  Administered 2021-06-16: 200 mg via INTRAVENOUS

## 2021-06-16 MED ORDER — FENTANYL CITRATE (PF) 100 MCG/2ML IJ SOLN
INTRAMUSCULAR | Status: AC
  Filled 2021-06-16: qty 2

## 2021-06-16 MED ORDER — ONDANSETRON HCL 4 MG PO TABS: 4.0000 mg | ORAL_TABLET | Freq: Three times a day (TID) | ORAL | 0 refills | Status: AC | PRN

## 2021-06-16 MED ORDER — FENTANYL CITRATE PF 50 MCG/ML IJ SOSY: 50.0000 ug | PREFILLED_SYRINGE | INTRAMUSCULAR | Status: DC | PRN

## 2021-06-16 MED ORDER — LACTATED RINGERS IV SOLN: INTRAVENOUS | Status: DC

## 2021-06-16 MED ORDER — DEXTROSE 5 % IV SOLN
INTRAVENOUS | Status: DC | PRN
  Administered 2021-06-16: 3 g via INTRAVENOUS

## 2021-06-16 MED ORDER — MIDAZOLAM HCL 2 MG/2ML IJ SOLN
INTRAMUSCULAR | Status: AC
  Filled 2021-06-16: qty 2

## 2021-06-16 MED ORDER — PHENYLEPHRINE 40 MCG/ML (10ML) SYRINGE FOR IV PUSH (FOR BLOOD PRESSURE SUPPORT)
PREFILLED_SYRINGE | INTRAVENOUS | Status: AC
  Filled 2021-06-16: qty 10

## 2021-06-16 MED ORDER — EPHEDRINE 5 MG/ML INJ
INTRAVENOUS | Status: AC
  Filled 2021-06-16: qty 5

## 2021-06-16 MED ORDER — LIDOCAINE HCL URETHRAL/MUCOSAL 2 % EX GEL
CUTANEOUS | Status: DC | PRN
  Administered 2021-06-16: 100 via TOPICAL

## 2021-06-16 MED ORDER — SODIUM CHLORIDE 0.9 % IR SOLN
Status: DC | PRN
  Administered 2021-06-16: 1000 mL

## 2021-06-16 MED ORDER — ONDANSETRON HCL 4 MG/2ML IJ SOLN
INTRAMUSCULAR | Status: DC | PRN
  Administered 2021-06-16: 4 mg via INTRAVENOUS

## 2021-06-16 SURGICAL SUPPLY — 44 items
APL PRP STRL LF DISP 70% ISPRP (MISCELLANEOUS) ×1
BANDAGE ELASTIC 2 LF NS (GAUZE/BANDAGES/DRESSINGS) ×1 IMPLANT
BANDAGE ESMARK 4X12 BL STRL LF (DISPOSABLE) ×1 IMPLANT
BLADE SURG 15 STRL LF DISP TIS (BLADE) ×1 IMPLANT
BLADE SURG 15 STRL SS (BLADE) ×4
BNDG CMPR 12X4 ELC STRL LF (DISPOSABLE) ×1
BNDG CMPR MED 5X2 ELC HKLP NS (GAUZE/BANDAGES/DRESSINGS) ×1
BNDG CMPR STD VLCR NS LF 5.8X2 (GAUZE/BANDAGES/DRESSINGS) ×1
BNDG COHESIVE 3X5 TAN STRL LF (GAUZE/BANDAGES/DRESSINGS) ×2 IMPLANT
BNDG COHESIVE 4X5 TAN STRL (GAUZE/BANDAGES/DRESSINGS) ×2 IMPLANT
BNDG CONFORM 2 STRL LF (GAUZE/BANDAGES/DRESSINGS) ×2 IMPLANT
BNDG ELASTIC 2X5.8 VLCR NS LF (GAUZE/BANDAGES/DRESSINGS) ×2 IMPLANT
BNDG ESMARK 4X12 BLUE STRL LF (DISPOSABLE) ×2
CHLORAPREP W/TINT 26 (MISCELLANEOUS) ×2 IMPLANT
CLOTH BEACON ORANGE TIMEOUT ST (SAFETY) ×2 IMPLANT
CORD BIPOLAR FORCEPS 12FT (ELECTRODE) ×2 IMPLANT
COVER LIGHT HANDLE STERIS (MISCELLANEOUS) ×4 IMPLANT
CUFF TOURN SGL QUICK 18X4 (TOURNIQUET CUFF) ×2 IMPLANT
DRAPE HALF SHEET 40X57 (DRAPES) ×2 IMPLANT
DRAPE SURG 17X23 STRL (DRAPES) ×2 IMPLANT
GAUZE 4X4 16PLY ~~LOC~~+RFID DBL (SPONGE) ×2 IMPLANT
GAUZE SPONGE 4X4 12PLY STRL (GAUZE/BANDAGES/DRESSINGS) ×2 IMPLANT
GAUZE XEROFORM 1X8 LF (GAUZE/BANDAGES/DRESSINGS) ×2 IMPLANT
GLOVE SRG 8 PF TXTR STRL LF DI (GLOVE) ×1 IMPLANT
GLOVE SURG POLYISO LF SZ8 (GLOVE) ×2 IMPLANT
GLOVE SURG UNDER POLY LF SZ7 (GLOVE) ×4 IMPLANT
GLOVE SURG UNDER POLY LF SZ8 (GLOVE) ×2
GOWN STRL REUS W/ TWL XL LVL3 (GOWN DISPOSABLE) ×1 IMPLANT
GOWN STRL REUS W/TWL LRG LVL3 (GOWN DISPOSABLE) ×2 IMPLANT
GOWN STRL REUS W/TWL XL LVL3 (GOWN DISPOSABLE) ×2
KIT TURNOVER KIT A (KITS) ×2 IMPLANT
MANIFOLD NEPTUNE II (INSTRUMENTS) ×2 IMPLANT
NDL HYPO 25X1 1.5 SAFETY (NEEDLE) IMPLANT
NEEDLE HYPO 25X1 1.5 SAFETY (NEEDLE) ×2 IMPLANT
NS IRRIG 1000ML POUR BTL (IV SOLUTION) ×2 IMPLANT
PACK BASIC LIMB (CUSTOM PROCEDURE TRAY) ×2 IMPLANT
PAD ARMBOARD 7.5X6 YLW CONV (MISCELLANEOUS) ×2 IMPLANT
POSITIONER HAND ALUMI XLG (MISCELLANEOUS) ×2 IMPLANT
SET BASIN LINEN APH (SET/KITS/TRAYS/PACK) ×2 IMPLANT
SPONGE GAUZE 4X4 12PLY (GAUZE/BANDAGES/DRESSINGS) ×1 IMPLANT
SUT PROLENE NAB BLUE 3-0 30IN (SUTURE) ×2 IMPLANT
SYR 20ML LL LF (SYRINGE) ×2 IMPLANT
SYR CONTROL 10ML LL (SYRINGE) ×2 IMPLANT
UNDERPAD 30X36 HEAVY ABSORB (UNDERPADS AND DIAPERS) ×2 IMPLANT

## 2021-06-16 NOTE — Interval H&P Note (Signed)
History and Physical Interval Note:  06/16/2021 7:21 AM  Judith [name]  has presented today for surgery, with the diagnosis of Left Thumb Trigger Finger.  The various methods of treatment have been discussed with the patient and family. After consideration of risks, benefits and other options for treatment, the patient has consented to  Procedure(s): [place] (Left) as a surgical intervention.  The patient's history has been reviewed, patient examined, no change in status, stable for surgery.  I have reviewed the patient's chart and labs.  Questions were answered to the patient's satisfaction.     Mordecai Rasmussen

## 2021-06-16 NOTE — Progress Notes (Signed)
Instructed on incentive spirometer. 2000 ml obtained. Tolerated well. 

## 2021-06-16 NOTE — Telephone Encounter (Signed)
PT SCHEDULED

## 2021-06-16 NOTE — Discharge Instructions (Signed)
°  Shelisha Gautier A. Roxanne Panek, MD MS [facility] [address] ,  [place]  [address] Phone: [phone] Fax: [phone]    POST-OPERATIVE INSTRUCTIONS   WOUND CARE You may remove your bandage on postop day 3 and get the hand wet.  No ointments or lotions to be applied to the incision.  Do not submerge the incision for 1 month.  FOLLOW-UP If you develop a Fever (>101.5), Redness or Drainage from the surgical incision site, please call our office to arrange for an evaluation. Please call the office to schedule a follow-up appointment for your incision check if you do not already have one, 7-10 days post-operatively.  IF YOU HAVE ANY QUESTIONS, PLEASE FEEL FREE TO CALL OUR OFFICE.  HELPFUL INFORMATION  You should wean off your narcotic medicines as soon as you are able.  Most patients will be off or using minimal narcotics before their first postop appointment.   You may be more comfortable sleeping in a semi-seated position the first few nights following surgery.  Keep a pillow propped under the elbow and forearm for comfort.  If you have a recliner type of chair it might be beneficial.    We suggest you use the pain medication the first night prior to going to bed, in order to ease any pain when the anesthesia wears off. You should avoid taking pain medications on an empty stomach as it will make you nauseous.  Do not drink alcoholic beverages or take illicit drugs when taking pain medications.  You may return to work/school in the next couple of days when you feel up to it. Desk work and typing is fine.  Pain medication may make you constipated.  Below are a few solutions to try in this order: Decrease the amount of pain medication if you aren't having pain. Drink lots of decaffeinated fluids. Drink prune juice and/or each dried prunes  If the first 3 don't work start with additional solutions Take Colace - an over-the-counter stool softener Take  Senokot - an over-the-counter laxative Take Miralax - a stronger over-the-counter laxative    

## 2021-06-16 NOTE — Anesthesia Preprocedure Evaluation (Addendum)
Anesthesia Evaluation  Patient identified by MRN, date of birth, ID band Patient awake    Reviewed: Allergy & Precautions, NPO status , [name] Chart, lab work & pertinent test results, reviewed documented beta blocker date and time   History of Anesthesia Complications (+) Family history of anesthesia reaction and history of anesthetic complications  Airway Mallampati: II  TM Distance: >3 FB Neck ROM: Full   Comment: ACDF Dental  (+) Dental Advisory Given, Edentulous Upper, Missing   Pulmonary shortness of breath and with exertion, asthma , sleep apnea , pneumonia, former smoker,    Pulmonary exam normal breath sounds clear to auscultation       Cardiovascular Exercise Tolerance: Good hypertension, Pt. on medications Normal cardiovascular exam Rhythm:Regular Rate:Normal     Neuro/Psych  Headaches, PSYCHIATRIC DISORDERS Anxiety Depression Bipolar Disorder  Neuromuscular disease    GI/Hepatic GERD (gastroparesis)  Medicated and Controlled,  Endo/Other  diabetes, Well Controlled, Type 2, Insulin Dependent, Oral Hypoglycemic AgentsHypothyroidism Morbid obesity  Renal/GU      Musculoskeletal  (+) Arthritis , Osteoarthritis,    Abdominal   Peds  (+) ATTENTION DEFICIT DISORDER WITHOUT HYPERACTIVITY Hematology   Anesthesia Other Findings ACDF Back pain  Reproductive/Obstetrics                          Anesthesia Physical Anesthesia Plan  ASA: 3  Anesthesia Plan: General   Post-op Pain Management: Minimal or no pain anticipated   Induction: Intravenous  PONV Risk Score and Plan: 4 or greater and Ondansetron and Metaclopromide  Airway Management Planned: Oral ETT  Additional Equipment:   Intra-op Plan:   Post-operative Plan: Extubation in OR  Informed Consent: I have reviewed the patients History and Physical, chart, labs and discussed the procedure including the risks, benefits and  alternatives for the proposed anesthesia with the patient or authorized representative who has indicated his/her understanding and acceptance.     Dental advisory given  Plan Discussed with: CRNA and Surgeon  Anesthesia Plan Comments:        Anesthesia Quick Evaluation

## 2021-06-16 NOTE — Telephone Encounter (Signed)
She has to be seen. Ok to put in for video visit and I can call in a few mins.

## 2021-06-16 NOTE — Op Note (Signed)
Orthopaedic Surgery Operative Note (CSN: [id number])  Judith [name]  01-Feb-1971 Date of Surgery: 06/16/2021   Diagnoses:  Left Thumb Trigger Finger  Procedure: Trigger finger release Left Thumb   Operative Finding Successful completion of the planned procedure.    Post-Op Diagnosis: Same Surgeons:Primary: Mordecai Rasmussen, MD Assistants:  N/A Location: [facility] OR ROOM 2 Anesthesia: General with local anesthesia Antibiotics: Ancef 2 g Tourniquet time:  Total Tourniquet Time Documented: Forearm (Left) - 16 minutes Total: Forearm (Left) - 16 minutes  Estimated Blood Loss: Minimal Complications: None Specimens: None Implants: * No implants in log *  Indications for Surgery:   Judith [name] is a 51 y.o. female with a Left Thumb trigger finger.  The patient is complaining of pain over the A1 pulley with progressively worsening triggering.  Pain has been severe and she is not interested in injections as prior steroid injections caused increases in her blood glucose.  Benefits and risks of operative and nonoperative management were discussed prior to surgery with patient and informed consent form was completed.  Specific risks including infection, need for additional surgery, damage to surrounding structures, recurrence and more severe complications associated with anesthesia were discussed.  They have elected to proceed with surgery.    Procedure:   The patient was identified properly. Informed consent was obtained and the surgical site was marked. The patient was taken to the OR suite where the above stated anesthesia was induced.  The patient was positioned supine on a hand table.  The left arm was prepped and draped in the usual sterile fashion.  Timeout was performed before the beginning of the case.  Tourniquet was used for the above duration.  The patient received antibiotics prior to the incision.   We made an incision directly overlying the A1 pulley to the left Thumb.  We carefully  incised sharply through the skin only.  We continued with blunt dissection to the level of the A1 pulley.  We identified the adjacent neurovascular bundles throughout the case.  The leading edge of the A1 pulley was identified and incised sharply with a knife.  We then used scissors to completely release the pulley distally, and then proximally.  The A1 pulley was noted to be very thick with some irritation of the underlying tendon.  We then used a hemostat to remove the tendon from its usual position to ensure that the A1 pulley was released and the tendon was moving freely.  We passively flexed and extended the Thumb and noted there were no additional adhesions or catching.  We irrigated the wound copiously.  We closed the incision with multiple interrupted sutures.  Marcaine 0.5% was then injected at the incision site.  Sterile dressing was placed.  Patient was awoken taken to PACU in stable condition.   Post-operative plan:  The patient will be discharged home. They can removed the bulky dressing in 3 days.   DVT prophylaxis not indicated in this ambulatory upper extremity patient without significant risk factors.    Patient will be provided with a limited prescription of narcotics for severe pain.  OTC medications can be used as needed.  Follow up plan will be scheduled in approximately 7-10 days for suture removal and incision check.

## 2021-06-16 NOTE — Progress Notes (Signed)
MyChart Video visit  Subjective: CC: Postop pain PCP: Janora Norlander, DO HPI:Judith [name] is a 51 y.o. female. Patient provides verbal consent for consult held via video.  Due to COVID-19 pandemic this visit was conducted virtually. This visit type was conducted due to national recommendations for restrictions regarding the COVID-19 Pandemic (e.g. social distancing, sheltering in place) in an effort to limit this patient's exposure and mitigate transmission in our community. All issues noted in this document were discussed and addressed.  A physical exam was not performed with this format.   Location of patient: home Location of provider: [facility] Others present for call: none  1. Post op pain Patient just had left thumb trigger finger release this morning.  When she went to go pick up the pain medication as prescribed by her surgeon, her insurance said that they would not fill it because I am her PCP.  She is asking for me to take care of this for her today.  So far she is feeling okay but the numbing medicine is starting to wear off.  She will be planning for a similar procedure on the right in about 6 weeks.   ROS: Per HPI  Allergies  Allergen Reactions   Diphenhydramine Other (See Comments)    Hair feels like it is crawling   Morphine Nausea And Vomiting    PCA PUMP/ DRIP -- N/V IV PUSH IN ER NO PROBLEM PER PT.     Prednisone Nausea And Vomiting   Adhesive [Tape]     Paper tape causes irritation    Erythromycin Hives   Metformin And Related     Diarrhea with IR and XR products   Sulfonamide Derivatives Other (See Comments)    Does not take due to family history   Past Medical History:  Diagnosis Date   Allergy    Anxiety    Arthritis    Asthma    Bipolar 1 disorder ([place])    Blood transfusion without reported diagnosis    with child birth 27 yrs ago    Bronchitis, chronic ([place])    Constipation    COVID-19 virus infection 06/21/2020   DDD (degenerative disc disease),  cervical    Depression    Diabetes mellitus without complication ([place])    Dysphagia    Family history of adverse reaction to anesthesia    mother had a hard time waking up after knee surgery in 2021   Gastroparesis    GERD (gastroesophageal reflux disease)    History of kidney stones    History of MRSA infection    Hyperlipemia    Hypothyroid    Migraines    Pneumonia    Sleep apnea    no cpap     Current Outpatient Medications:    Accu-Chek FastClix Lancets MISC, Test blood sugars daily Dx E11.9, Disp: 102 each, Rfl: 3   albuterol (PROVENTIL) (2.5 MG/3ML) 0.083% nebulizer solution, Take 3 mLs (2.5 mg total) by nebulization every 6 (six) hours as needed for wheezing or shortness of breath. Fill upon patient request, Disp: 75 mL, Rfl: 12   albuterol (VENTOLIN HFA) 108 (90 Base) MCG/ACT inhaler, Inhale 2 puffs into the lungs every 6 (six) hours as needed for wheezing or shortness of breath. Fill upon patient request, Disp: 8 g, Rfl: 1   baclofen (LIORESAL) 10 MG tablet, TAKE 0.5-1 TABLETS BY MOUTH 3 TIMES DAILY AS NEEDED FOR MUSCLE SPASMS. (Patient taking differently: Take 10 mg by mouth 3 (three) times daily.),  Disp: 90 tablet, Rfl: 12   Blood Glucose Monitoring Suppl (ACCU-CHEK GUIDE ME) w/Device KIT, Use to check BS BID and as needed. DX: E11.9, Disp: 1 kit, Rfl: 0   budesonide-formoterol (SYMBICORT) 160-4.5 MCG/ACT inhaler, Inhale 2 puffs into the lungs 2 (two) times daily. (Patient taking differently: Inhale 2 puffs into the lungs 2 (two) times daily as needed (shortness of breath).), Disp: 1 each, Rfl: 3   cetirizine (ZYRTEC) 10 MG tablet, Take 10 mg by mouth daily as needed for allergies., Disp: , Rfl:    Continuous Blood Gluc Sensor (FREESTYLE LIBRE 3 SENSOR) MISC, 2 each by Does not apply route every 14 (fourteen) days. Place 1 sensor on the skin every 14 days. Use to check glucose continuously, Disp: 2 each, Rfl: 5   Cyanocobalamin (B-12 PO), Take 1 tablet by mouth See admin  instructions. Take 1 tablet daily, may take a second tablet as needed for energy, Disp: , Rfl:    cycloSPORINE (RESTASIS) 0.05 % ophthalmic emulsion, Place 1 drop into both eyes 2 (two) times daily., Disp: , Rfl:    dexlansoprazole (DEXILANT) 60 MG capsule, Take 1 capsule (60 mg total) by mouth daily. (Patient taking differently: Take 60 mg by mouth at bedtime.), Disp: 90 capsule, Rfl: 3   diclofenac Sodium (VOLTAREN) 1 % GEL, APPLY 4 GRAMS TOPICALLY 4 (FOUR) TIMES DAILY AS NEEDED (KNEE PAIN)., Disp: 400 g, Rfl: 0   dicyclomine (BENTYL) 20 MG tablet, TAKE 1 TABLET (20 MG TOTAL) BY MOUTH EVERY 6 (SIX) HOURS. (Patient taking differently: Take 20 mg by mouth every 6 (six) hours as needed for spasms (abdominal cramps).), Disp: 360 tablet, Rfl: 0   docusate sodium (COLACE) 100 MG capsule, Take 1 capsule (100 mg total) by mouth 2 (two) times daily. (Patient taking differently: Take 100 mg by mouth 2 (two) times daily as needed for mild constipation.), Disp: 60 capsule, Rfl: 0   gabapentin (NEURONTIN) 300 MG capsule, 1 capsule (300 mg total) 3 (three) times daily., Disp: 90 capsule, Rfl: 3   glucose blood (ACCU-CHEK GUIDE) test strip, Test blood sugars daily Dx E11.9, Disp: 100 strip, Rfl: 3   hydrochlorothiazide (HYDRODIURIL) 25 MG tablet, Take 25 mg by mouth daily., Disp: , Rfl:    HYDROcodone-acetaminophen (NORCO/VICODIN) 5-325 MG tablet, Take 1 tablet by mouth every 4 (four) hours as needed for up to 7 days for moderate pain., Disp: 10 tablet, Rfl: 0   ibuprofen (ADVIL) 600 MG tablet, Take 1 tablet (600 mg total) by mouth at bedtime as needed. (Patient taking differently: Take 600 mg by mouth See admin instructions. Take 600 mg at bedtime, may take another 600 mg dose up to twice daily as needed for pain), Disp: 30 tablet, Rfl: 3   insulin degludec (TRESIBA FLEXTOUCH) 200 UNIT/ML FlexTouch Pen, Inject 70-76 Units into the skin at bedtime. (Patient taking differently: Inject 75-80 Units into the skin at  bedtime.), Disp: 15 mL, Rfl: PRN   Insulin Pen Needle (B-D UF III MINI PEN NEEDLES) 31G X 5 MM MISC, use as directed once daily Dx E11.8, Disp: 100 each, Rfl: 4   lamoTRIgine (LAMICTAL) 150 MG tablet, 1 tablet PO BID (Patient taking differently: Take 300 mg by mouth daily.), Disp: 180 tablet, Rfl: 3   levothyroxine (SYNTHROID) 125 MCG tablet, Take 1 tablet (125 mcg total) by mouth daily., Disp: 90 tablet, Rfl: 1   linaclotide (LINZESS) 72 MCG capsule, Take 1 capsule (72 mcg total) by mouth daily before breakfast. (Patient taking differently: Take 72  mcg by mouth daily as needed (constipation).), Disp: 30 capsule, Rfl: 2   [START ON 07/19/2021] lisdexamfetamine (VYVANSE) 70 MG capsule, Take 1 capsule (70 mg total) by mouth daily., Disp: 30 capsule, Rfl: 0   [START ON 06/20/2021] lisdexamfetamine (VYVANSE) 70 MG capsule, Take 1 capsule (70 mg total) by mouth daily., Disp: 30 capsule, Rfl: 0   lisdexamfetamine (VYVANSE) 70 MG capsule, Take 1 capsule (70 mg total) by mouth daily., Disp: 30 capsule, Rfl: 0   medroxyPROGESTERone (DEPO-PROVERA) 150 MG/ML injection, Inject 1 mL (150 mg total) into the muscle every 3 (three) months., Disp: 1 mL, Rfl: 3   Mepolizumab (NUCALA) 100 MG/ML SOAJ, Inject 1 mL (100 mg total) into the skin every 28 (twenty-eight) days. Deliver to patient's home, Disp: 3 mL, Rfl: 1   montelukast (SINGULAIR) 10 MG tablet, TAKE ONE TAB BY MOUTH AT BEDTIME, Disp: 90 tablet, Rfl: 3   nystatin cream (MYCOSTATIN), Apply 1 application topically 2 (two) times daily. x2 weeks (Patient taking differently: Apply 1 application topically 2 (two) times daily as needed (rash).), Disp: 60 g, Rfl: 0   ondansetron (ZOFRAN) 4 MG tablet, Take 1 tablet (4 mg total) by mouth every 8 (eight) hours as needed for up to 14 days for nausea or vomiting., Disp: 15 tablet, Rfl: 0   ondansetron (ZOFRAN-ODT) 4 MG disintegrating tablet, Take 1-2 tablets (4-8 mg total) by mouth every 8 (eight) hours as needed for nausea or  vomiting., Disp: 30 tablet, Rfl: 0   pioglitazone (ACTOS) 45 MG tablet, Take 45 mg by mouth at bedtime., Disp: , Rfl:    promethazine (PHENERGAN) 25 MG tablet, Take 1 tablet (25 mg total) by mouth every 6 (six) hours as needed for nausea or vomiting., Disp: 30 tablet, Rfl: 0   rosuvastatin (CRESTOR) 40 MG tablet, Take 1 tablet (40 mg total) by mouth at bedtime., Disp: 90 tablet, Rfl: 3   Semaglutide (RYBELSUS) 14 MG TABS, Take 1 tablet by mouth daily before breakfast., Disp: 30 tablet, Rfl: prn   sertraline (ZOLOFT) 100 MG tablet, Take 2 tablets (200 mg total) by mouth daily., Disp: 180 tablet, Rfl: 3  Current Facility-Administered Medications:    medroxyPROGESTERone (DEPO-PROVERA) injection 150 mg, 150 mg, Intramuscular, Q90 days, Josedaniel Haye M, DO, 150 mg at 05/06/21 1452  Facility-Administered Medications Ordered in Other Visits:    bupivacaine (MARCAINE) 0.5 % injection, , , PRN, Mordecai Rasmussen, MD, 7 mL at 06/16/21 8144   ceFAZolin (ANCEF) 2-4 GM/100ML-% IVPB, , , ,    ceFAZolin (ANCEF) IVPB 3g/100 mL premix, 3 g, Intravenous, On Call to OR, Mordecai Rasmussen, MD   chlorhexidine (PERIDEX) 0.12 % solution, , , ,    fentaNYL (SUBLIMAZE) injection 50 mcg, 50 mcg, Intravenous, Q5 min PRN, Battula, Rajamani C, MD   lactated ringers infusion, , Intravenous, Continuous, Battula, Rajamani C, MD, Stopped at 06/16/21 0826   ondansetron (ZOFRAN) injection 4 mg, 4 mg, Intravenous, Once PRN, Battula, Rajamani C, MD   sodium chloride irrigation 0.9 %, , , PRN, Mordecai Rasmussen, MD, 1,000 mL at 06/16/21 0759  Gen: nontoxic appearing female. NAD  Assessment/ Plan: 51 y.o. female   Post-operative pain - Plan: HYDROcodone-acetaminophen (NORCO/VICODIN) 5-325 MG tablet  History of thumb surgery - Plan: HYDROcodone-acetaminophen (NORCO/VICODIN) 5-325 MG tablet  I reviewed patient's Rx from today.  Her surgeon recommended Norco 5 mg every 4 hours.  Write for #10 with 0 refills that this is what I  renewed and asked pharmacy to cancel previous  Rx sent by surgeon.  Recommended Colace with each use.  Caution sedation.  I have highly recommended that she make an appointment with me the day of her next surgery so that we can continue pain management if she has no leftovers from today's supply.  National narcotic database reviewed and there were no red flags  Start time: 12:25pm End time: 12:30pm  Total time spent on patient care (including video visit/ documentation): 5 minutes  [place], [place] [phone]

## 2021-06-16 NOTE — Transfer of Care (Signed)
Immediate Anesthesia Transfer of Care Note  Patient: Judith [name]  Procedure(s) Performed: LEFT THUMB TRIGGER FINGER RELEASE (Left: Thumb)  Patient Location: PACU  Anesthesia Type:General  Level of Consciousness: awake, alert  and oriented  Airway & Oxygen Therapy: Patient Spontanous Breathing  Post-op Assessment: Report given to RN, Post -op Vital signs reviewed and stable and Patient moving all extremities X 4  Post vital signs: Reviewed and stable  Last Vitals:  Vitals Value Taken Time  BP 112/84 06/16/21 0832  Temp 37.5 C 06/16/21 0832  Pulse 96 06/16/21 0832  Resp 21 06/16/21 0832  SpO2 95 % 06/16/21 0832  Vitals shown include unvalidated device data.  Last Pain:  Vitals:   06/16/21 0832  TempSrc: Tympanic  PainSc:          Complications: No notable events documented.

## 2021-06-16 NOTE — Anesthesia Procedure Notes (Signed)
Procedure Name: Intubation Date/Time: 06/16/2021 7:41 AM Performed by: Sharee Pimple, CRNA Pre-anesthesia Checklist: Patient identified, Emergency Drugs available, Suction available and Patient being monitored Patient Re-evaluated:Patient Re-evaluated prior to induction Oxygen Delivery Method: Circle system utilized Preoxygenation: Pre-oxygenation with 100% oxygen Induction Type: IV induction Ventilation: Mask ventilation without difficulty Laryngoscope Size: Glidescope and 3 Tube type: Oral Tube size: 7.0 mm Number of attempts: 1 Airway Equipment and Method: Stylet and Video-laryngoscopy Placement Confirmation: ETT inserted through vocal cords under direct vision, positive ETCO2 and breath sounds checked- equal and bilateral Secured at: 21 cm Tube secured with: Tape Dental Injury: Teeth and Oropharynx as per pre-operative assessment

## 2021-06-16 NOTE — Anesthesia Postprocedure Evaluation (Signed)
Anesthesia Post Note  Patient: AGUSTA HACKENBERG  Procedure(s) Performed: LEFT THUMB TRIGGER FINGER RELEASE (Left: Thumb)  Patient location during evaluation: Phase II Anesthesia Type: General Level of consciousness: awake and alert and oriented Pain management: pain level controlled Vital Signs Assessment: post-procedure vital signs reviewed and stable Respiratory status: spontaneous breathing, nonlabored ventilation and respiratory function stable Cardiovascular status: blood pressure returned to baseline and stable Postop Assessment: no apparent nausea or vomiting Anesthetic complications: no   No notable events documented.   Last Vitals:  Vitals:   06/16/21 0901 06/16/21 0903  BP:  128/88  Pulse: 96   Resp: 18   Temp:    SpO2: 95%     Last Pain:  Vitals:   06/16/21 0902  TempSrc:   PainSc: 0-No pain                 Reeanna Acri C Wilhelmenia Addis

## 2021-06-17 ENCOUNTER — Other Ambulatory Visit: Payer: Self-pay | Admitting: Family Medicine

## 2021-06-17 ENCOUNTER — Encounter (HOSPITAL_COMMUNITY): Payer: Self-pay | Admitting: Orthopedic Surgery

## 2021-06-17 DIAGNOSIS — G8918 Other acute postprocedural pain: Secondary | ICD-10-CM

## 2021-06-17 DIAGNOSIS — Z9889 Other specified postprocedural states: Secondary | ICD-10-CM

## 2021-06-17 MED ORDER — HYDROCODONE-ACETAMINOPHEN 5-325 MG PO TABS: 1.0000 | ORAL_TABLET | ORAL | 0 refills | Status: AC | PRN

## 2021-06-26 ENCOUNTER — Other Ambulatory Visit: Payer: Self-pay | Admitting: Family Medicine

## 2021-06-27 ENCOUNTER — Ambulatory Visit (INDEPENDENT_AMBULATORY_CARE_PROVIDER_SITE_OTHER): Payer: Medicaid Other | Admitting: Orthopedic Surgery

## 2021-06-27 ENCOUNTER — Other Ambulatory Visit: Payer: Self-pay

## 2021-06-27 ENCOUNTER — Encounter: Payer: Self-pay | Admitting: Orthopedic Surgery

## 2021-06-27 DIAGNOSIS — M65312 Trigger thumb, left thumb: Secondary | ICD-10-CM

## 2021-06-27 NOTE — Progress Notes (Signed)
Orthopaedic Postop Note  Assessment: Judith [name] is a 51 y.o. female s/p left trigger thumb release  DOS: 06/16/2021  Plan: Sutures were removed, Steri-Strips were placed. No triggering is appreciated. She does continue to have some pain with movement of the left thumb. Limitations were discussed. Okay to work on range of motion. No creams or lotion on the incision. Follow-up in [place], in approximately 4 weeks for repeat evaluation. We can consider proceeding with surgery on the right hand.   Follow-up: Return in about 4 weeks (around 07/25/2021). XR at next visit: None  Subjective:  Chief Complaint  Patient presents with   Routine Post Op    DOS 06/16/21 LEFT thumb trigger finger    History of Present Illness: Judith [name] is a 51 y.o. female who presents following the above stated procedure.  Surgery was approximately 2 weeks ago.  She has done well.  She has had some issues with pain.  She has been taking some pain medicines.  I have not provided her with additional pain medication since surgery.  She denies active triggering in the left thumb.  She does note some pain and restricted range of motion of the left thumb.  No numbness or tingling.  No fevers or chills.  Review of Systems: No fevers or chills No numbness or tingling No Chest Pain No shortness of breath   Objective: There were no vitals taken for this visit.  Physical Exam:  Alert and oriented.  No acute distress.  Surgical incisions healing well.  No surrounding erythema or drainage.  Mild tenderness to palpation at the incision site.  No triggering is appreciated.  Restricted range of motion at the MCP joint.  Fingers are warm and well-perfused.  2+ radial pulse.  IMAGING: I personally ordered and reviewed the following images:  No new imaging obtained today.  Mordecai Rasmussen, MD 06/27/2021 1:59 PM

## 2021-06-27 NOTE — Patient Instructions (Addendum)
Schedule in [place]- 3/13

## 2021-07-07 ENCOUNTER — Other Ambulatory Visit: Payer: Self-pay | Admitting: Family Medicine

## 2021-07-07 ENCOUNTER — Telehealth: Payer: Self-pay

## 2021-07-07 DIAGNOSIS — E118 Type 2 diabetes mellitus with unspecified complications: Secondary | ICD-10-CM

## 2021-07-07 DIAGNOSIS — Z794 Long term (current) use of insulin: Secondary | ICD-10-CM

## 2021-07-07 DIAGNOSIS — E1169 Type 2 diabetes mellitus with other specified complication: Secondary | ICD-10-CM

## 2021-07-07 NOTE — Telephone Encounter (Signed)
Ady r Redmond Pulling KeyGeorgiann Mohs - PA Case ID: [id number] - Rx #: [id number] Need help? Call us at [phone] Status Sent to [place] 2 Sensor Form [employer] [place] Electronic [place] Form [phone] NCPDP) Original Claim Info [id number]

## 2021-07-08 ENCOUNTER — Other Ambulatory Visit (HOSPITAL_COMMUNITY): Payer: Self-pay

## 2021-07-08 ENCOUNTER — Other Ambulatory Visit: Payer: Self-pay | Admitting: Family Medicine

## 2021-07-09 NOTE — Telephone Encounter (Signed)
Denied on February 20 PA Case: [id number], Status: Denied. Notification: Completed.

## 2021-07-09 NOTE — Telephone Encounter (Signed)
Please inform

## 2021-07-09 NOTE — Telephone Encounter (Signed)
Pt is not wanting libre 2, she wants libre 3. She is also requesting a sample of libre 3 until she can get a perscription

## 2021-07-09 NOTE — Telephone Encounter (Signed)
I don't think this is covered by medicaid but will cc [name] to be sure.

## 2021-07-10 MED ORDER — FREESTYLE LIBRE 3 SENSOR MISC: 2.0000 | 5 refills | Status: DC

## 2021-07-10 NOTE — Addendum Note (Signed)
Addended by: Lottie Dawson D on: 07/10/2021 09:01 AM   Modules accepted: Orders

## 2021-07-10 NOTE — Telephone Encounter (Signed)
Libre 3 should now be covered under medicaid.  Lets try

## 2021-07-16 DIAGNOSIS — M5412 Radiculopathy, cervical region: Secondary | ICD-10-CM | POA: Diagnosis not present

## 2021-07-16 DIAGNOSIS — R030 Elevated blood-pressure reading, without diagnosis of hypertension: Secondary | ICD-10-CM | POA: Diagnosis not present

## 2021-07-16 DIAGNOSIS — M4712 Other spondylosis with myelopathy, cervical region: Secondary | ICD-10-CM | POA: Diagnosis not present

## 2021-07-16 DIAGNOSIS — Z6841 Body Mass Index (BMI) 40.0 and over, adult: Secondary | ICD-10-CM | POA: Diagnosis not present

## 2021-07-17 ENCOUNTER — Other Ambulatory Visit: Payer: Self-pay | Admitting: Neurosurgery

## 2021-07-17 DIAGNOSIS — M5412 Radiculopathy, cervical region: Secondary | ICD-10-CM

## 2021-07-21 ENCOUNTER — Other Ambulatory Visit (HOSPITAL_COMMUNITY): Payer: Self-pay

## 2021-07-21 ENCOUNTER — Other Ambulatory Visit: Payer: Self-pay

## 2021-07-22 ENCOUNTER — Other Ambulatory Visit: Payer: Self-pay | Admitting: Family Medicine

## 2021-07-22 DIAGNOSIS — R110 Nausea: Secondary | ICD-10-CM

## 2021-07-23 ENCOUNTER — Ambulatory Visit (INDEPENDENT_AMBULATORY_CARE_PROVIDER_SITE_OTHER): Payer: Medicaid Other | Admitting: *Deleted

## 2021-07-23 ENCOUNTER — Telehealth: Payer: Self-pay | Admitting: Family Medicine

## 2021-07-23 DIAGNOSIS — Z3042 Encounter for surveillance of injectable contraceptive: Secondary | ICD-10-CM

## 2021-07-23 NOTE — Telephone Encounter (Signed)
Libre 2 sample provided ?

## 2021-07-23 NOTE — Progress Notes (Signed)
DepoProvera given and patient tolerated well.  

## 2021-07-25 ENCOUNTER — Telehealth: Payer: Self-pay | Admitting: Family Medicine

## 2021-07-25 NOTE — Telephone Encounter (Signed)
.. ?  Medicaid Managed Care  ? ?Unsuccessful Outreach Note ? ?07/25/2021 ?Name: Judith [name] MRN: [id number] DOB: Aug 11, 1970 ? ?Referred by: Janora Norlander, DO ?Reason for referral : High Risk Managed Medicaid (The MM Team received a referral from the patient's insurance company. I called the patient today but she did not answer. I left my name and number on her VM.) ? ? ?An unsuccessful telephone outreach was attempted today. The patient was referred to the case management team for assistance with care management and care coordination.  ? ?Follow Up Plan: The care management team will reach out to the patient again over the next 7 days.  ? ? ?Reita Chard ?Care Guide, High Risk Medicaid Managed Care ?Embedded Care Coordination ?[place]  ? ? ? ?

## 2021-07-28 ENCOUNTER — Encounter: Payer: Medicaid Other | Admitting: Orthopedic Surgery

## 2021-07-28 ENCOUNTER — Telehealth: Payer: Self-pay | Admitting: Family Medicine

## 2021-07-28 NOTE — Telephone Encounter (Signed)
..  Patient declines further follow up and engagement by the Managed Medicaid Team. Appropriate care team members and provider have been notified via electronic communication. The Managed Medicaid Team is available to follow up with the patient after provider conversation with the patient regarding recommendation for engagement and subsequent re-referral to the Managed Medicaid Team.    Jennifer Alley Care Guide, High Risk Medicaid Managed Care Embedded Care Coordination [place]  [facility]   

## 2021-07-29 ENCOUNTER — Other Ambulatory Visit: Payer: Self-pay

## 2021-07-29 ENCOUNTER — Ambulatory Visit (INDEPENDENT_AMBULATORY_CARE_PROVIDER_SITE_OTHER): Payer: Medicaid Other

## 2021-07-29 DIAGNOSIS — R519 Headache, unspecified: Secondary | ICD-10-CM

## 2021-07-29 DIAGNOSIS — R531 Weakness: Secondary | ICD-10-CM

## 2021-07-29 DIAGNOSIS — M5412 Radiculopathy, cervical region: Secondary | ICD-10-CM

## 2021-07-31 ENCOUNTER — Ambulatory Visit: Payer: Medicaid Other | Attending: Neurosurgery | Admitting: Physical Therapy

## 2021-08-08 DIAGNOSIS — M4712 Other spondylosis with myelopathy, cervical region: Secondary | ICD-10-CM | POA: Diagnosis not present

## 2021-08-15 ENCOUNTER — Telehealth: Payer: Self-pay | Admitting: Family Medicine

## 2021-08-15 ENCOUNTER — Encounter: Payer: Self-pay | Admitting: Nurse Practitioner

## 2021-08-15 ENCOUNTER — Ambulatory Visit: Payer: Medicaid Other | Admitting: Nurse Practitioner

## 2021-08-15 VITALS — BP 158/92 | HR 105 | Temp 98.1°F | Ht 67.0 in | Wt 310.0 lb

## 2021-08-15 DIAGNOSIS — E1169 Type 2 diabetes mellitus with other specified complication: Secondary | ICD-10-CM

## 2021-08-15 DIAGNOSIS — J40 Bronchitis, not specified as acute or chronic: Secondary | ICD-10-CM

## 2021-08-15 DIAGNOSIS — H9202 Otalgia, left ear: Secondary | ICD-10-CM | POA: Diagnosis not present

## 2021-08-15 MED ORDER — BENZONATATE 100 MG PO CAPS: 200.0000 mg | ORAL_CAPSULE | Freq: Three times a day (TID) | ORAL | 0 refills | Status: DC | PRN

## 2021-08-15 MED ORDER — SALINE SPRAY 0.65 % NA SOLN
1.0000 | NASAL | 0 refills | Status: DC | PRN
Start: 2021-08-15 — End: 2021-11-05

## 2021-08-15 MED ORDER — TRESIBA FLEXTOUCH 200 UNIT/ML ~~LOC~~ SOPN: 86.0000 [IU] | PEN_INJECTOR | Freq: Every day | SUBCUTANEOUS | 3 refills | Status: DC

## 2021-08-15 MED ORDER — GUAIFENESIN ER 600 MG PO TB12: 600.0000 mg | ORAL_TABLET | Freq: Two times a day (BID) | ORAL | 0 refills | Status: DC

## 2021-08-15 MED ORDER — OFLOXACIN 0.3 % OT SOLN: 10.0000 [drp] | Freq: Every day | OTIC | 0 refills | Status: DC

## 2021-08-15 NOTE — Progress Notes (Signed)
? ?Acute Office Visit ? ?Subjective:  ? ? Patient ID: Judith [name], female    DOB: [birth year], 51 y.o.   MRN: [id number] ? ?Chief Complaint  ?Patient presents with  ? Cough  ?  X 5 days ?Hurts to breath ?Hurts to cough ?Non-productive cough ?recurring  ? ? ?Cough ?This is a recurrent problem. The current episode started in the past 7 days. The problem has been gradually worsening. The problem occurs constantly. The cough is Non-productive. Associated symptoms include ear pain, postnasal drip and wheezing. Pertinent negatives include no chest pain, chills, ear congestion, fever, nasal congestion, rash, shortness of breath, sweats or weight loss. Nothing aggravates the symptoms. Her past medical history is significant for asthma.  ?Otalgia  ?There is pain in the left ear. This is a new problem. The current episode started yesterday. The problem occurs constantly. The problem has been gradually worsening. There has been no fever. The pain is at a severity of 5/10. Associated symptoms include coughing. Pertinent negatives include no rash. She has tried nothing for the symptoms.  ? ? ?Past Medical History:  ?Diagnosis Date  ? Allergy   ? Anxiety   ? Arthritis   ? Asthma   ? Bipolar 1 disorder ([place])   ? Blood transfusion without reported diagnosis   ? with child birth 65 yrs ago   ? Bronchitis, chronic ([place])   ? Constipation   ? COVID-19 virus infection 06/21/2020  ? DDD (degenerative disc disease), cervical   ? Depression   ? Diabetes mellitus without complication ([place])   ? Dysphagia   ? Family history of adverse reaction to anesthesia   ? mother had a hard time waking up after knee surgery in 2021  ? Gastroparesis   ? GERD (gastroesophageal reflux disease)   ? History of kidney stones   ? History of MRSA infection   ? Hyperlipemia   ? Hypothyroid   ? Migraines   ? Pneumonia   ? Sleep apnea   ? no cpap   ? ? ?Past Surgical History:  ?Procedure Laterality Date  ? ABDOMINAL SURGERY    ? ANTERIOR CERVICAL DECOMP/DISCECTOMY  FUSION N/A 10/09/2020  ? Procedure: Anterior Cervical Discectomy Fusion Cervical five-six;  Surgeon: Vallarie Mare, MD;  Location: [facility];  Service: Neurosurgery;  Laterality: N/A;  ? BIOPSY  02/04/2021  ? Procedure: BIOPSY;  Surgeon: Yetta Flock, MD;  Location: [facility];  Service: Gastroenterology;;  ? BOTOX INJECTION N/A 02/04/2021  ? Procedure: BOTOX INJECTION;  Surgeon: Yetta Flock, MD;  Location: [facility];  Service: Gastroenterology;  Laterality: N/A;  ? CARPAL TUNNEL RELEASE Bilateral 1999  ? CESAREAN SECTION    ? CHOLECYSTECTOMY    ? COLONOSCOPY    ? ESOPHAGEAL MANOMETRY N/A 07/08/2020  ? Procedure: ESOPHAGEAL MANOMETRY (EM);  Surgeon: Yetta Flock, MD;  Location: [facility];  Service: Gastroenterology;  Laterality: N/A;  ? ESOPHAGOGASTRODUODENOSCOPY (EGD) WITH PROPOFOL N/A 02/04/2021  ? Procedure: ESOPHAGOGASTRODUODENOSCOPY (EGD) WITH PROPOFOL;  Surgeon: Yetta Flock, MD;  Location: [facility];  Service: Gastroenterology;  Laterality: N/A;  ? REPLACEMENT TOTAL KNEE Bilateral   ? SCLEROTHERAPY  02/04/2021  ? Procedure: SCLEROTHERAPY;  Surgeon: Yetta Flock, MD;  Location: [facility];  Service: Gastroenterology;;  ? THYROIDECTOMY    ? TONSILLECTOMY    ? TONSILLECTOMY AND ADENOIDECTOMY    ? TRIGGER FINGER RELEASE Left 06/16/2021  ? Procedure: LEFT THUMB TRIGGER FINGER RELEASE;  Surgeon: Mordecai Rasmussen, MD;  Location: [facility];  Service: Orthopedics;  Laterality: Left;  ? UPPER GASTROINTESTINAL ENDOSCOPY    ? ? ?Family History  ?Problem Relation Age of Onset  ? Mental illness Other   ? Diabetes Other   ? Healthy Mother   ? Cancer Father   ? Non-Hodgkin's lymphoma Father   ? Breast cancer Paternal Grandmother   ? Lung cancer Paternal Grandmother   ? Esophageal cancer Other   ? Colon cancer Neg Hx   ? Stomach cancer Neg Hx   ? Pancreatic cancer Neg Hx   ? Colon polyps Neg Hx   ? ? ?Social History  ? ?Socioeconomic History  ? Marital status: Divorced  ?  Spouse  name: Not on file  ? Number of children: Not on file  ? Years of education: Not on file  ? Highest education level: Not on file  ?Occupational History  ? Not on file  ?Tobacco Use  ? Smoking status: Former  ?  Packs/day: 1.00  ?  Years: 5.00  ?  Pack years: 5.00  ?  Types: Cigarettes  ?  Quit date: 04/04/1993  ?  Years since quitting: 28.3  ? Smokeless tobacco: Never  ? Tobacco comments:  ?  smoked when went out with friends/social smoker. pack would last a week.  ?Vaping Use  ? Vaping Use: Former  ?Substance and Sexual Activity  ? Alcohol use: No  ? Drug use: No  ? Sexual activity: Yes  ?  Birth control/protection: I.U.D.  ?Other Topics Concern  ? Not on file  ?Social History Narrative  ? Not on file  ? ?Social Determinants of Health  ? ?Financial Resource Strain: Not on file  ?Food Insecurity: Not on file  ?Transportation Needs: Not on file  ?Physical Activity: Not on file  ?Stress: Not on file  ?Social Connections: Not on file  ?Intimate Partner Violence: Not on file  ? ? ?Outpatient Medications Prior to Visit  ?Medication Sig Dispense Refill  ? Accu-Chek FastClix Lancets MISC Test blood sugars daily Dx E11.9 102 each 3  ? albuterol (PROVENTIL) (2.5 MG/3ML) 0.083% nebulizer solution Take 3 mLs (2.5 mg total) by nebulization every 6 (six) hours as needed for wheezing or shortness of breath. Fill upon patient request 75 mL 12  ? albuterol (VENTOLIN HFA) 108 (90 Base) MCG/ACT inhaler Inhale 2 puffs into the lungs every 6 (six) hours as needed for wheezing or shortness of breath. Fill upon patient request 8 g 1  ? baclofen (LIORESAL) 10 MG tablet TAKE 0.5-1 TABLETS BY MOUTH 3 TIMES DAILY AS NEEDED FOR MUSCLE SPASMS. (Patient taking differently: Take 10 mg by mouth 3 (three) times daily.) 90 tablet 12  ? Blood Glucose Monitoring Suppl (ACCU-CHEK GUIDE ME) w/Device KIT Use to check BS BID and as needed. DX: E11.9 1 kit 0  ? budesonide-formoterol (SYMBICORT) 160-4.5 MCG/ACT inhaler Inhale 2 puffs into the lungs 2 (two)  times daily. (Patient taking differently: Inhale 2 puffs into the lungs 2 (two) times daily as needed (shortness of breath).) 1 each 3  ? cetirizine (ZYRTEC) 10 MG tablet Take 10 mg by mouth daily as needed for allergies.    ? Continuous Blood Gluc Sensor (FREESTYLE LIBRE 3 SENSOR) MISC 2 EACH BY DOES NOT APPLY ROUTE EVERY 14 (FOURTEEN) DAYS. PLACE 1 SENSOR ON THE SKIN EVERY 14 DAYS. USE TO CHECK GLUCOSE CONTINUOUSLY 2 each 5  ? Continuous Blood Gluc Sensor (FREESTYLE LIBRE 3 SENSOR) MISC 2 each by Does not apply route every 14 (fourteen) days. Place 1  sensor on the skin every 14 days. Use to check glucose continuously 2 each 5  ? Cyanocobalamin (B-12 PO) Take 1 tablet by mouth See admin instructions. Take 1 tablet daily, may take a second tablet as needed for energy    ? cycloSPORINE (RESTASIS) 0.05 % ophthalmic emulsion Place 1 drop into both eyes 2 (two) times daily.    ? dexlansoprazole (DEXILANT) 60 MG capsule Take 1 capsule (60 mg total) by mouth daily. (Patient taking differently: Take 60 mg by mouth at bedtime.) 90 capsule 3  ? diclofenac Sodium (VOLTAREN) 1 % GEL APPLY 4 GRAMS TOPICALLY 4 (FOUR) TIMES DAILY AS NEEDED (KNEE PAIN). 400 g 0  ? dicyclomine (BENTYL) 20 MG tablet TAKE 1 TABLET (20 MG TOTAL) BY MOUTH EVERY 6 (SIX) HOURS. (Patient taking differently: Take 20 mg by mouth every 6 (six) hours as needed for spasms (abdominal cramps).) 360 tablet 0  ? docusate sodium (COLACE) 100 MG capsule Take 1 capsule (100 mg total) by mouth 2 (two) times daily. (Patient taking differently: Take 100 mg by mouth 2 (two) times daily as needed for mild constipation.) 60 capsule 0  ? gabapentin (NEURONTIN) 300 MG capsule 1 capsule (300 mg total) 3 (three) times daily. 90 capsule 3  ? glucose blood (ACCU-CHEK GUIDE) test strip Test blood sugars daily Dx E11.9 100 strip 3  ? hydrochlorothiazide (HYDRODIURIL) 25 MG tablet Take 25 mg by mouth daily.    ? ibuprofen (ADVIL) 600 MG tablet Take 1 tablet (600 mg total) by mouth  at bedtime as needed. (Patient taking differently: Take 600 mg by mouth See admin instructions. Take 600 mg at bedtime, may take another 600 mg dose up to twice daily as needed for pain) 30 tablet 3  ? in

## 2021-08-15 NOTE — Telephone Encounter (Signed)
Patient had a visit with JE today.  ?

## 2021-08-15 NOTE — Telephone Encounter (Signed)
Tyler Aas called in ?

## 2021-08-15 NOTE — Patient Instructions (Signed)
Acute Bronchitis, Adult ?Acute bronchitis is when air tubes in the lungs (bronchi) suddenly get swollen. The condition can make it hard for you to breathe. In adults, acute bronchitis usually goes away within 2 weeks. A cough caused by bronchitis may last up to 3 weeks. Smoking, allergies, and asthma can make the condition worse. ?What are the causes? ?Germs that cause cold and flu (viruses). The most common cause of this condition is the virus that causes the common cold. ?Bacteria. ?Substances that bother (irritate) the lungs, including: ?Smoke from cigarettes and other types of tobacco. ?Dust and pollen. ?Fumes from chemicals, gases, or burned fuel. ?Indoor or outdoor air pollution. ?What increases the risk? ?A weak body's defense system. This is also called the immune system. ?Any condition that affects your lungs and breathing, such as asthma. ?What are the signs or symptoms? ?A cough. ?Coughing up clear, yellow, or green mucus. ?Making high-pitched whistling sounds when you breathe, most often when you breathe out (wheezing). ?Runny or stuffy nose. ?Having too much mucus in your lungs (chest congestion). ?Shortness of breath. ?Body aches. ?A sore throat. ?How is this treated? ?Acute bronchitis may go away over time without treatment. Your doctor may tell you to: ?Drink more fluids. This will help thin your mucus so it is easier to cough up. ?Use a device that gets medicine into your lungs (inhaler). ?Use a vaporizer or a humidifier. These are machines that add water to the air. This helps with coughing and poor breathing. ?Take a medicine that thins mucus and helps clear it from your lungs. ?Take a medicine that prevents or stops coughing. ?It is not common to take an antibiotic medicine for this condition. ?Follow these instructions at home: ? ?Take over-the-counter and prescription medicines only as told by your doctor. ?Use an inhaler, vaporizer, or humidifier as told by your doctor. ?Take two teaspoons (10  mL) of honey at bedtime. This helps lessen your coughing at night. ?Drink enough fluid to keep your pee (urine) pale yellow. ?Do not smoke or use any products that contain nicotine or tobacco. If you need help quitting, ask your doctor. ?Get a lot of rest. ?Return to your normal activities when your doctor says that it is safe. ?Keep all follow-up visits. ?How is this prevented? ? ?Wash your hands often with soap and water for at least 20 seconds. If you cannot use soap and water, use hand sanitizer. ?Avoid contact with people who have cold symptoms. ?Try not to touch your mouth, nose, or eyes with your hands. ?Avoid breathing in smoke or chemical fumes. ?Make sure to get the flu shot every year. ?Contact a doctor if: ?Your symptoms do not get better in 2 weeks. ?You have trouble coughing up the mucus. ?Your cough keeps you awake at night. ?You have a fever. ?Get help right away if: ?You cough up blood. ?You have chest pain. ?You have very bad shortness of breath. ?You faint or keep feeling like you are going to faint. ?You have a very bad headache. ?Your fever or chills get worse. ?These symptoms may be an emergency. Get help right away. Call your local emergency services (911 in the [place] ?Do not wait to see if the symptoms will go away. ?Do not drive yourself to the hospital. ?Summary ?Acute bronchitis is when air tubes in the lungs (bronchi) suddenly get swollen. In adults, acute bronchitis usually goes away within 2 weeks. ?Drink more fluids. This will help thin your mucus so it is easier to   cough up. ?Take over-the-counter and prescription medicines only as told by your doctor. ?Contact a doctor if your symptoms do not improve after 2 weeks of treatment. ?This information is not intended to replace advice given to you by your health care provider. Make sure you discuss any questions you have with your health care provider. ?Document Revised: 09/04/2020 Document Reviewed: 09/04/2020 ?Elsevier Patient Education  ? 2022 Else[employer] Inc[employer] ? ?

## 2021-08-18 ENCOUNTER — Emergency Department
Admission: EM | Admit: 2021-08-18 | Discharge: 2021-08-18 | Disposition: A | Discharge status: Home / Self Care | Payer: Medicaid Other | Source: Home / Self Care

## 2021-08-18 ENCOUNTER — Telehealth: Payer: Self-pay | Admitting: Pharmacist

## 2021-08-18 DIAGNOSIS — R059 Cough, unspecified: Secondary | ICD-10-CM

## 2021-08-18 MED ORDER — METHYLPREDNISOLONE SODIUM SUCC 125 MG IJ SOLR
125.0000 mg | Freq: Once | INTRAMUSCULAR | Status: AC
  Administered 2021-08-18: 125 mg via INTRAMUSCULAR

## 2021-08-18 MED ORDER — PROMETHAZINE-DM 6.25-15 MG/5ML PO SYRP
5.0000 mL | ORAL_SOLUTION | Freq: Two times a day (BID) | ORAL | 0 refills | Status: DC | PRN
Start: 2021-08-18 — End: 2021-09-08

## 2021-08-18 NOTE — ED Triage Notes (Signed)
Pt states that she has a cough, chest congestion and some nasal congestion. X1 week ? ?Pt states that she was seen by her pcp x2 days ago. ?Pt states that she was diagnosed with bronchitis.  ?Pt states that the medications she was given isn't helping her.  ? ?Pt states that she is vaccinated for covid. ?Pt states that she has had flu vaccine.  ?

## 2021-08-18 NOTE — Discharge Instructions (Addendum)
Advised patient may use Promethazine DM daily or as needed for cough.  Advised patient of sedative effects of this medication.  Encourage patient increase daily water intake while taking this medication.  Advised patient if symptoms worsen and/or unresolved please follow-up with PCP or here for further evaluation. ?

## 2021-08-18 NOTE — ED Provider Notes (Signed)
?[facility] ? ? ? ?CSN: [id number] ?Arrival date & time: 08/18/21  1304 ? ? ?  ? ?History   ?Chief Complaint ?Chief Complaint  ?Patient presents with  ? Cough  ?  Cough, chest congestion and nasal congestion. X1 week  ? ? ?HPI ?Judith [name] is a 51 y.o. female.  ? ?HPI 51 year old female presents with cough, chest congestion and nasal congestion for 1 week.  PMH significant for morbid obesity, chronic bronchitis, T2DM, and hypothyroidism.  Patient was treated for bronchitis by her PCP on Friday, 08/15/2021 was prescribed Tessalon Perles and Mucinex.  Reports that she was given Depo-Medrol at this office visit last week; however, chart review reveals no injection given.  Chart review also reveals allergy to prednisone.  Patient request Solu-Medrol IM for her cough today.  Patient reports has received this in the past which is helped her greatly. ? ?Past Medical History:  ?Diagnosis Date  ? Allergy   ? Anxiety   ? Arthritis   ? Asthma   ? Bipolar 1 disorder ([place])   ? Blood transfusion without reported diagnosis   ? with child birth 68 yrs ago   ? Bronchitis, chronic ([place])   ? Constipation   ? COVID-19 virus infection 06/21/2020  ? DDD (degenerative disc disease), cervical   ? Depression   ? Diabetes mellitus without complication ([place])   ? Dysphagia   ? Family history of adverse reaction to anesthesia   ? mother had a hard time waking up after knee surgery in 2021  ? Gastroparesis   ? GERD (gastroesophageal reflux disease)   ? History of kidney stones   ? History of MRSA infection   ? Hyperlipemia   ? Hypothyroid   ? Migraines   ? Pneumonia   ? Sleep apnea   ? no cpap   ? ? ?Patient Active Problem List  ? Diagnosis Date Noted  ? Esophagogastric junction outflow obstruction   ? Gastric polyp   ? Cervical myelopathy ([place]) 10/09/2020  ? Mild intermittent asthma with acute exacerbation 10/04/2020  ? Dysphagia   ? Patellofemoral pain syndrome of both knees 04/19/2020  ? Tinnitus of right ear 03/15/2020  ? Closed  fracture of one rib of left side 03/15/2020  ? Concussion with no loss of consciousness 02/05/2020  ? Spondylosis of cervical spine 02/05/2020  ? Acute bilateral thoracic back pain 02/05/2020  ? Acute bilateral low back pain without sciatica 02/05/2020  ? Gastroparesis 11/03/2019  ? Hospital discharge follow-up 11/03/2019  ? DDD (degenerative disc disease), lumbar 06/30/2019  ? Lumbar back pain with radiculopathy affecting lower extremity 06/20/2019  ? 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 History of left knee replacement 02/10/2018  ? Morbid obesity ([place]) 11/21/2017  ? 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 Episodic mood disorder ([place]) 05/12/2016  ? Controlled diabetes mellitus type 2 with complications ([place]) 63/05/6008  ? Gastroesophageal reflux disease without esophagitis 05/12/2016  ? DYSPNEA 07/22/2010  ? CHEST PAIN 07/22/2010  ? Hypothyroidism 09/13/2007  ? Hyperlipidemia 09/13/2007  ? Depression, major, single episode, moderate ([place]) 09/13/2007  ? MIGRAINE HEADACHE 09/13/2007  ? Essential hypertension 09/13/2007  ? Asthma 09/13/2007  ? CHOLELITHIASIS, HX OF 09/13/2007  ? NEPHROLITHIASIS, HX OF 09/13/2007  ? CARPAL TUNNEL RELEASE, HX OF 09/13/2007  ? ? ?Past Surgical History:  ?Procedure Laterality Date  ? ABDOMINAL SURGERY    ? ANTERIOR CERVICAL DECOMP/DISCECTOMY FUSION N/A 10/09/2020  ? Procedure: Anterior Cervical Discectomy Fusion Cervical five-six;  Surgeon: Vallarie Mare, MD;  Location: [facility];  Service: Neurosurgery;  Laterality: N/A;  ? BIOPSY  02/04/2021  ? Procedure: BIOPSY;  Surgeon: Yetta Flock, MD;  Location: [facility];  Service: Gastroenterology;;  ? BOTOX INJECTION N/A 02/04/2021  ? Procedure: BOTOX INJECTION;  Surgeon: Yetta Flock, MD;  Location: [facility];  Service: Gastroenterology;  Laterality: N/A;  ? CARPAL TUNNEL RELEASE Bilateral 1999  ? CESAREAN SECTION    ? CHOLECYSTECTOMY    ? COLONOSCOPY    ? ESOPHAGEAL MANOMETRY  N/A 07/08/2020  ? Procedure: ESOPHAGEAL MANOMETRY (EM);  Surgeon: Yetta Flock, MD;  Location: [facility];  Service: Gastroenterology;  Laterality: N/A;  ? ESOPHAGOGASTRODUODENOSCOPY (EGD) WITH PROPOFOL N/A 02/04/2021  ? Procedure: ESOPHAGOGASTRODUODENOSCOPY (EGD) WITH PROPOFOL;  Surgeon: Yetta Flock, MD;  [facility];  Service: Gastroenterology;  Laterality: N/A;  ? REPLACEMENT TOTAL KNEE Bilateral   ? SCLEROTHERAPY  02/04/2021  ? Procedure: SCLEROTHERAPY;  Surgeon: Yetta Flock, MD;  Location: [facility];  Service: Gastroenterology;;  ? THYROIDECTOMY    ? TONSILLECTOMY    ? TONSILLECTOMY AND ADENOIDECTOMY    ? TRIGGER FINGER RELEASE Left 06/16/2021  ? Procedure: LEFT THUMB TRIGGER FINGER RELEASE;  Surgeon: Mordecai Rasmussen, MD;  Location: [facility];  Service: Orthopedics;  Laterality: Left;  ? UPPER GASTROINTESTINAL ENDOSCOPY    ? ? ?OB History   ? ? Gravida  ?1  ? Para  ?1  ? Term  ?1  ? Preterm  ?   ? AB  ?   ? Living  ?   ?  ? ? SAB  ?   ? IAB  ?   ? Ectopic  ?   ? Multiple  ?   ? Live Births  ?   ?   ?  ?  ? ? ? ?Home Medications   ? ?Prior to Admission medications   ?Medication Sig Start Date End Date Taking? Authorizing Provider  ?Accu-Chek FastClix Lancets MISC Test blood sugars daily Dx E11.9 06/25/20  Yes Gottschalk, Ashly M, DO  ?albuterol (PROVENTIL) (2.5 MG/3ML) 0.083% nebulizer solution Take 3 mLs (2.5 mg total) by nebulization every 6 (six) hours as needed for wheezing or shortness of breath. Fill upon patient request 09/26/20  Yes Martyn Ehrich, NP  ?albuterol (VENTOLIN HFA) 108 (90 Base) MCG/ACT inhaler Inhale 2 puffs into the lungs every 6 (six) hours as needed for wheezing or shortness of breath. Fill upon patient request 09/26/20  Yes Martyn Ehrich, NP  ?baclofen (LIORESAL) 10 MG tablet TAKE 0.5-1 TABLETS BY MOUTH 3 TIMES DAILY AS NEEDED FOR MUSCLE SPASMS. ?Patient taking differently: Take 10 mg by mouth 3 (three) times daily. 01/27/21  Yes Ronnie Doss  M, DO  ?benzonatate (TESSALON PERLES) 100 MG capsule Take 2 capsules (200 mg total) by mouth 3 (three) times daily as needed. 08/15/21  Yes Ivy Lynn, NP  ?Blood Glucose Monitoring Suppl (ACCU-CHEK GUIDE ME) w/Device KIT Use to check BS BID and as needed. DX: E11.9 06/25/20  Yes Ronnie Doss M, DO  ?budesonide-formoterol (SYMBICORT) 160-4.5 MCG/ACT inhaler Inhale 2 puffs into the lungs 2 (two) times daily. ?Patient taking differently: Inhale 2 puffs into the lungs 2 (two) times daily as needed (shortness of breath). 09/26/20  Yes Martyn Ehrich, NP  ?cetirizine (ZYRTEC) 10 MG tablet Take 10 mg by mouth daily as needed for allergies.   Yes [provider]  ?Continuous Blood Gluc Sensor (FREESTYLE LIBRE 3 SENSOR) [place]  BY DOES NOT APPLY ROUTE EVERY 14 (FOURTEEN) DAYS. PLACE 1 SENSOR ON THE SKIN EVERY 14 DAYS. USE TO CHECK GLUCOSE CONTINUOUSLY 07/10/21  Yes Janora Norlander, DO  ?Cyanocobalamin (B-12 PO) Take 1 tablet by mouth See admin instructions. Take 1 tablet daily, may take a second tablet as needed for energy   Yes [provider]  ?cycloSPORINE (RESTASIS) 0.05 % ophthalmic emulsion Place 1 drop into both eyes 2 (two) times daily.   Yes [provider]  ?dexlansoprazole (DEXILANT) 60 MG capsule Take 1 capsule (60 mg total) by mouth daily. ?Patient taking differently: Take 60 mg by mouth at bedtime. 01/13/21  Yes Armbruster, Carlota Raspberry, MD  ?diclofenac Sodium (VOLTAREN) 1 % GEL APPLY 4 GRAMS TOPICALLY 4 (FOUR) TIMES DAILY AS NEEDED (KNEE PAIN). 01/27/21  Yes Ronnie Doss M, DO  ?dicyclomine (BENTYL) 20 MG tablet TAKE 1 TABLET (20 MG TOTAL) BY MOUTH EVERY 6 (SIX) HOURS. ?Patient taking differently: Take 20 mg by mouth every 6 (six) hours as needed for spasms (abdominal cramps). 08/07/20  Yes Ronnie Doss M, DO  ?docusate sodium (COLACE) 100 MG capsule Take 1 capsule (100 mg total) by mouth 2 (two) times daily. ?Patient taking differently: Take 100 mg by mouth 2  (two) times daily as needed for mild constipation. 10/10/20  Yes Vallarie Mare, MD  ?gabapentin (NEURONTIN) 300 MG capsule 1 capsule (300 mg total) 3 (three) times daily. 11/06/20  Yes Lajuana Ripple, Myna Hidalgo

## 2021-08-20 ENCOUNTER — Ambulatory Visit: Payer: Medicaid Other | Attending: Neurosurgery

## 2021-08-20 ENCOUNTER — Telehealth: Payer: Self-pay | Admitting: Family Medicine

## 2021-08-20 DIAGNOSIS — M542 Cervicalgia: Secondary | ICD-10-CM | POA: Insufficient documentation

## 2021-08-20 MED ORDER — METHYLPREDNISOLONE ACETATE 80 MG/ML IJ SUSP
80.0000 mg | Freq: Once | INTRAMUSCULAR | Status: AC
  Administered 2021-08-15: 80 mg via INTRAMUSCULAR
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
## 2021-08-20 NOTE — Telephone Encounter (Signed)
Contacted patient and assured that documentation has been completed ?

## 2021-08-20 NOTE — Addendum Note (Signed)
Addended by: Geryl Rankins D on: 08/20/2021 04:58 PM ? ? Modules accepted: Orders ? ?

## 2021-08-26 NOTE — Telephone Encounter (Signed)
Deniedon April 3 ?PA Case: [id number], Status: Denied. Notification: Completed. ?

## 2021-08-27 ENCOUNTER — Other Ambulatory Visit: Payer: Self-pay | Admitting: Family Medicine

## 2021-08-27 ENCOUNTER — Encounter: Payer: Self-pay | Admitting: Family Medicine

## 2021-08-27 DIAGNOSIS — M5030 Other cervical disc degeneration, unspecified cervical region: Secondary | ICD-10-CM

## 2021-08-27 DIAGNOSIS — M5136 Other intervertebral disc degeneration, lumbar region: Secondary | ICD-10-CM

## 2021-08-27 NOTE — Telephone Encounter (Signed)
Place order for BMP and A1c.  She had a low potassium level recently. ?

## 2021-08-27 NOTE — Telephone Encounter (Signed)
Libre 3 CGM approved ?Patient to divide insulin dose into 2 injections daily ?Needs to make DM appt with PCP ?Patient notified ? ?

## 2021-09-08 ENCOUNTER — Ambulatory Visit: Payer: Medicaid Other | Admitting: Family Medicine

## 2021-09-08 ENCOUNTER — Encounter: Payer: Medicaid Other | Admitting: Orthopedic Surgery

## 2021-09-08 ENCOUNTER — Encounter: Payer: Self-pay | Admitting: Family Medicine

## 2021-09-08 VITALS — BP 146/90 | HR 83 | Temp 98.2°F | Ht 67.0 in | Wt 308.0 lb

## 2021-09-08 DIAGNOSIS — Z794 Long term (current) use of insulin: Secondary | ICD-10-CM

## 2021-09-08 DIAGNOSIS — I152 Hypertension secondary to endocrine disorders: Secondary | ICD-10-CM

## 2021-09-08 DIAGNOSIS — E876 Hypokalemia: Secondary | ICD-10-CM | POA: Diagnosis not present

## 2021-09-08 DIAGNOSIS — F988 Other specified behavioral and emotional disorders with onset usually occurring in childhood and adolescence: Secondary | ICD-10-CM | POA: Diagnosis not present

## 2021-09-08 DIAGNOSIS — E1169 Type 2 diabetes mellitus with other specified complication: Secondary | ICD-10-CM | POA: Diagnosis not present

## 2021-09-08 DIAGNOSIS — E785 Hyperlipidemia, unspecified: Secondary | ICD-10-CM | POA: Diagnosis not present

## 2021-09-08 DIAGNOSIS — E1159 Type 2 diabetes mellitus with other circulatory complications: Secondary | ICD-10-CM | POA: Diagnosis not present

## 2021-09-08 DIAGNOSIS — F39 Unspecified mood [affective] disorder: Secondary | ICD-10-CM

## 2021-09-08 LAB — BASIC METABOLIC PANEL
BUN/Creatinine Ratio: 17 (ref 9–23)
BUN: 17 mg/dL (ref 6–24)
CO2: 25 mmol/L (ref 20–29)
Calcium: 8.8 mg/dL (ref 8.7–10.2)
Chloride: 105 mmol/L (ref 96–106)
Creatinine, Ser: 1 mg/dL (ref 0.57–1.00)
Glucose: 108 mg/dL — ABNORMAL HIGH (ref 70–99)
Potassium: 3.8 mmol/L (ref 3.5–5.2)
Sodium: 143 mmol/L (ref 134–144)
eGFR: 68 mL/min/{1.73_m2} (ref >59)

## 2021-09-08 LAB — BAYER DCA HB A1C WAIVED: HB A1C (BAYER DCA - WAIVED): 7.1 % — ABNORMAL HIGH (ref 4.8–5.6)

## 2021-09-08 NOTE — Progress Notes (Signed)
? ?Subjective: ?CC:Dm ?PCP: Janora Norlander, DO ?HPI:Judith [name] is a 51 y.o. female presenting to clinic today for: ? ?1. Type 2 Diabetes with hypertension, hyperlipidemia:  ?Recently had a corticosteroid injection x2 for asthma exacerbation/bronchitis.  She is compliant with her medications but admits that when she was wearing the freestyle libre 2 she had lowered her Rybelsus some because it was telling her that she was having hypoglycemic episodes.  She has since transitioned over to freestyle libre 3 and this is monitoring her blood sugars much better.  Her average blood sugar right now is about 140. ? ?Last eye exam: Needs ?Last foot exam: Needs ?Last A1c:  ?Lab Results  ?Component Value Date  ? HGBA1C 6.5 (H) 04/29/2021  ? ?Nephropathy screen indicated?:  Needs ?Last flu, zoster and/or pneumovax:  ?Immunization History  ?Administered Date(s) Administered  ? Influenza, Seasonal, Injecte, Preservative Fre 02/09/2018  ? Influenza,inj,Quad PF,6+ Mos 02/14/2017, 02/20/2019  ? Influenza-Unspecified 02/05/2020, 01/21/2021  ? MMR 02/03/1972  ? Moderna Sars-Covid-2 Vaccination 10/02/2019, 11/01/2019, 04/18/2020, 11/26/2020  ? PNEUMOCOCCAL CONJUGATE-20 11/26/2020  ? Pneumococcal Polysaccharide-23 02/20/2019, 11/01/2019  ? Td 03/03/1985, 06/30/1994  ? Tdap 12/24/2014, 11/26/2020  ? Zoster Recombinat (Shingrix) 02/05/2020  ? ? ?ROS: Per HPI ? ?Allergies  ?Allergen Reactions  ? Diphenhydramine Other (See Comments)  ?  Hair feels like it is crawling  ? Morphine Nausea And Vomiting  ?  PCA PUMP/ DRIP -- N/V ?IV PUSH IN ER NO PROBLEM PER PT.    ? Prednisone Nausea And Vomiting  ? Adhesive [Tape]   ?  Paper tape causes irritation   ? Erythromycin Hives  ? Metformin And Related   ?  Diarrhea with IR and XR products  ? Sulfonamide Derivatives Other (See Comments)  ?  Does not take due to family history  ? ?Past Medical History:  ?Diagnosis Date  ? Allergy   ? Anxiety   ? Arthritis   ? Asthma   ? Bipolar 1 disorder ([place])    ? Blood transfusion without reported diagnosis   ? with child birth 24 yrs ago   ? Bronchitis, chronic ([place])   ? Constipation   ? COVID-19 virus infection 06/21/2020  ? DDD (degenerative disc disease), cervical   ? Depression   ? Diabetes mellitus without complication ([place])   ? Dysphagia   ? Family history of adverse reaction to anesthesia   ? mother had a hard time waking up after knee surgery in 2021  ? Gastroparesis   ? GERD (gastroesophageal reflux disease)   ? History of kidney stones   ? History of MRSA infection   ? Hyperlipemia   ? Hypothyroid   ? Migraines   ? Pneumonia   ? Sleep apnea   ? no cpap   ? ? ?Current Outpatient Medications:  ?  Accu-Chek FastClix Lancets MISC, Test blood sugars daily Dx E11.9, Disp: 102 each, Rfl: 3 ?  albuterol (PROVENTIL) (2.5 MG/3ML) 0.083% nebulizer solution, Take 3 mLs (2.5 mg total) by nebulization every 6 (six) hours as needed for wheezing or shortness of breath. Fill upon patient request, Disp: 75 mL, Rfl: 12 ?  albuterol (VENTOLIN HFA) 108 (90 Base) MCG/ACT inhaler, Inhale 2 puffs into the lungs every 6 (six) hours as needed for wheezing or shortness of breath. Fill upon patient request, Disp: 8 g, Rfl: 1 ?  baclofen (LIORESAL) 10 MG tablet, TAKE 0.5-1 TABLETS BY MOUTH 3 TIMES DAILY AS NEEDED FOR MUSCLE SPASMS. (Patient taking differently: Take 10 mg  by mouth 3 (three) times daily.), Disp: 90 tablet, Rfl: 12 ?  Blood Glucose Monitoring Suppl (ACCU-CHEK GUIDE ME) w/Device KIT, Use to check BS BID and as needed. DX: E11.9, Disp: 1 kit, Rfl: 0 ?  budesonide-formoterol (SYMBICORT) 160-4.5 MCG/ACT inhaler, Inhale 2 puffs into the lungs 2 (two) times daily. (Patient taking differently: Inhale 2 puffs into the lungs 2 (two) times daily as needed (shortness of breath).), Disp: 1 each, Rfl: 3 ?  cetirizine (ZYRTEC) 10 MG tablet, Take 10 mg by mouth daily as needed for allergies., Disp: , Rfl:  ?  Continuous Blood Gluc Sensor (FREESTYLE LIBRE 3 SENSOR) MISC, 2 EACH BY DOES NOT  APPLY ROUTE EVERY 14 (FOURTEEN) DAYS. PLACE 1 SENSOR ON THE SKIN EVERY 14 DAYS. USE TO CHECK GLUCOSE CONTINUOUSLY, Disp: 2 each, Rfl: 5 ?  Cyanocobalamin (B-12 PO), Take 1 tablet by mouth See admin instructions. Take 1 tablet daily, may take a second tablet as needed for energy, Disp: , Rfl:  ?  cycloSPORINE (RESTASIS) 0.05 % ophthalmic emulsion, Place 1 drop into both eyes 2 (two) times daily., Disp: , Rfl:  ?  dexlansoprazole (DEXILANT) 60 MG capsule, Take 1 capsule (60 mg total) by mouth daily. (Patient taking differently: Take 60 mg by mouth at bedtime.), Disp: 90 capsule, Rfl: 3 ?  diclofenac Sodium (VOLTAREN) 1 % GEL, APPLY 4 GRAMS TOPICALLY 4 (FOUR) TIMES DAILY AS NEEDED (KNEE PAIN)., Disp: 400 g, Rfl: 0 ?  dicyclomine (BENTYL) 20 MG tablet, TAKE 1 TABLET (20 MG TOTAL) BY MOUTH EVERY 6 (SIX) HOURS. (Patient taking differently: Take 20 mg by mouth every 6 (six) hours as needed for spasms (abdominal cramps).), Disp: 360 tablet, Rfl: 0 ?  docusate sodium (COLACE) 100 MG capsule, Take 1 capsule (100 mg total) by mouth 2 (two) times daily. (Patient taking differently: Take 100 mg by mouth 2 (two) times daily as needed for mild constipation.), Disp: 60 capsule, Rfl: 0 ?  gabapentin (NEURONTIN) 300 MG capsule, TAKE 1 CAPSULE BY MOUTH THREE TIMES A DAY, Disp: 270 capsule, Rfl: 0 ?  glucose blood (ACCU-CHEK GUIDE) test strip, Test blood sugars daily Dx E11.9, Disp: 100 strip, Rfl: 3 ?  guaiFENesin (MUCINEX) 600 MG 12 hr tablet, Take 1 tablet (600 mg total) by mouth 2 (two) times daily., Disp: 30 tablet, Rfl: 0 ?  hydrochlorothiazide (HYDRODIURIL) 25 MG tablet, Take 25 mg by mouth daily., Disp: , Rfl:  ?  ibuprofen (ADVIL) 600 MG tablet, Take 1 tablet (600 mg total) by mouth at bedtime as needed. (Patient taking differently: Take 600 mg by mouth See admin instructions. Take 600 mg at bedtime, may take another 600 mg dose up to twice daily as needed for pain), Disp: 30 tablet, Rfl: 3 ?  insulin degludec (TRESIBA  FLEXTOUCH) 200 UNIT/ML FlexTouch Pen, Inject 86 Units into the skin at bedtime., Disp: 18 mL, Rfl: 3 ?  Insulin Pen Needle (B-D UF III MINI PEN NEEDLES) 31G X 5 MM MISC, use as directed once daily Dx E11.8, Disp: 100 each, Rfl: 4 ?  lamoTRIgine (LAMICTAL) 150 MG tablet, 1 tablet PO BID (Patient taking differently: Take 300 mg by mouth daily.), Disp: 180 tablet, Rfl: 3 ?  levothyroxine (SYNTHROID) 125 MCG tablet, Take 1 tablet (125 mcg total) by mouth daily., Disp: 90 tablet, Rfl: 1 ?  linaclotide (LINZESS) 72 MCG capsule, Take 1 capsule (72 mcg total) by mouth daily before breakfast. (Patient taking differently: Take 72 mcg by mouth daily as needed (constipation).), Disp: 30 capsule,  Rfl: 2 ?  lisdexamfetamine (VYVANSE) 70 MG capsule, Take 1 capsule (70 mg total) by mouth daily., Disp: 30 capsule, Rfl: 0 ?  medroxyPROGESTERone (DEPO-PROVERA) 150 MG/ML injection, Inject 1 mL (150 mg total) into the muscle every 3 (three) months., Disp: 1 mL, Rfl: 3 ?  Mepolizumab (NUCALA) 100 MG/ML SOAJ, Inject 1 mL (100 mg total) into the skin every 28 (twenty-eight) days. Deliver to patient's home, Disp: 3 mL, Rfl: 1 ?  montelukast (SINGULAIR) 10 MG tablet, TAKE ONE TAB BY MOUTH AT BEDTIME, Disp: 90 tablet, Rfl: 3 ?  nystatin cream (MYCOSTATIN), Apply 1 application topically 2 (two) times daily. x2 weeks (Patient taking differently: Apply 1 application. topically 2 (two) times daily as needed (rash).), Disp: 60 g, Rfl: 0 ?  ofloxacin (FLOXIN OTIC) 0.3 % OTIC solution, Place 10 drops into the left ear daily., Disp: 5 mL, Rfl: 0 ?  ondansetron (ZOFRAN-ODT) 4 MG disintegrating tablet, TAKE 1-2 TABLETS (4-8 MG TOTAL) BY MOUTH EVERY 8 (EIGHT) HOURS AS NEEDED FOR NAUSEA OR VOMITING., Disp: 30 tablet, Rfl: 0 ?  pioglitazone (ACTOS) 45 MG tablet, TAKE 1 TABLET BY MOUTH EVERY DAY, Disp: 90 tablet, Rfl: 3 ?  promethazine (PHENERGAN) 25 MG tablet, Take 1 tablet (25 mg total) by mouth every 6 (six) hours as needed for nausea or vomiting.,  Disp: 30 tablet, Rfl: 0 ?  rosuvastatin (CRESTOR) 40 MG tablet, Take 1 tablet (40 mg total) by mouth at bedtime., Disp: 90 tablet, Rfl: 3 ?  Semaglutide (RYBELSUS) 14 MG TABS, Take 1 tablet by mouth daily before

## 2021-09-09 ENCOUNTER — Ambulatory Visit: Payer: Medicaid Other | Admitting: *Deleted

## 2021-09-09 ENCOUNTER — Telehealth: Payer: Self-pay | Admitting: Family Medicine

## 2021-09-09 ENCOUNTER — Ambulatory Visit (INDEPENDENT_AMBULATORY_CARE_PROVIDER_SITE_OTHER): Payer: Medicaid Other | Admitting: Orthopedic Surgery

## 2021-09-09 DIAGNOSIS — M65311 Trigger thumb, right thumb: Secondary | ICD-10-CM

## 2021-09-09 DIAGNOSIS — M65312 Trigger thumb, left thumb: Secondary | ICD-10-CM

## 2021-09-09 DIAGNOSIS — M65321 Trigger finger, right index finger: Secondary | ICD-10-CM

## 2021-09-09 DIAGNOSIS — M65341 Trigger finger, right ring finger: Secondary | ICD-10-CM

## 2021-09-09 NOTE — H&P (View-Only) (Signed)
Right ring finger, index finger, trigger thumb  ? ? ? ?Orthopaedic Clinic Return - Virtual Telephone Visit ?  ?**Visit was conducted via telephone at the patient's request.  No vital signs or physical exam were completed.  All previous medical records were readily available during my conversation with the patient.  In total, I was on the phone with the patient for 15 minutes** ?  ?Location: ?Patient: Home ?Provider: Clinic ? ?Assessment: ?Judith [name] is a 51 y.o. female with the following: ? Status post release of A1 pulley to left thumb ?2.  Right ring finger, index finger and thumb trigger fingers ? ?Plan: ?Triggering for the left thumb has improved, although she does continue to have some burning sensations to the left thumb.  Overall this feels better.  She continues to have triggering of the right index, right thumb, as well as tenderness to palpation over the A1 pulley to the index finger.  This was discussed via telephone today.  She is interested in pursuing surgery for her right hand.  She states her symptoms are progressing.  After discussing the procedure in great detail, she would like to proceed with surgery on May 15.  Currently, she has an appointment scheduled for May 8, to be seen in [place].  We do not need to complete this visit, however she has some questions or concerns, I am happy to see her at her next scheduled visit.  Otherwise, I will see her approximately 7-10 days after surgery. ? ? ?Follow-up: ?Return for After surgery: DOS 09/29/2021. ? ? ?Subjective: ? ?Chief Complaint  ?Patient presents with  ? Follow-up  ?  Left thumb follow up; right ring finger and thumb triggering.  Pain to index finger.   ? ? ?History of Present Illness: ?I connected with  Judith [name] on 09/10/21 via telephone and verified that I am speaking with the correct person using two identifiers. ?  ?I discussed the limitations of evaluation and management by telemedicine. The patient expressed understanding and agreed  to proceed. ? ? ?Judith [name] is a 51 y.o. female who I contacted regarding follow-up for her left trigger thumb.  She underwent surgery approximately 10 weeks ago.  The triggering has improved.  She does have some burning type pain close to the incision site.  She does rub this,.  This is gradually improving.  She also continues to have triggering of the right index and thumb.  This is worsening.  In addition, she notes some tenderness at the base of the index finger.  This has progressed since I saw her last.  We have previously discussed proceeding with surgery to the right hand, and she is interested. ? ?Review of Systems: ?No fevers or chills ?No numbness or tingling ?No chest pain ?No shortness of breath ?No bowel or bladder dysfunction ?No GI distress ?No headaches ? ? ?Objective: ?No vital signs ? ?Physical Exam: ? ?Telephone consult - No exam  ? ?IMAGING: ?I personally ordered and reviewed the following images: ? ?No new imaging today ? ?Mordecai Rasmussen, MD ?09/10/2021 ?11:06 AM ? ? ? ?

## 2021-09-09 NOTE — Progress Notes (Signed)
Right ring finger, index finger, trigger thumb  ? ? ? ?Orthopaedic Clinic Return - Virtual Telephone Visit ?  ?**Visit was conducted via telephone at the patient's request.  No vital signs or physical exam were completed.  All previous medical records were readily available during my conversation with the patient.  In total, I was on the phone with the patient for 15 minutes** ?  ?Location: ?Patient: Home ?Provider: Clinic ? ?Assessment: ?Judith [name] is a 51 y.o. female with the following: ? Status post release of A1 pulley to left thumb ?2.  Right ring finger, index finger and thumb trigger fingers ? ?Plan: ?Triggering for the left thumb has improved, although she does continue to have some burning sensations to the left thumb.  Overall this feels better.  She continues to have triggering of the right index, right thumb, as well as tenderness to palpation over the A1 pulley to the index finger.  This was discussed via telephone today.  She is interested in pursuing surgery for her right hand.  She states her symptoms are progressing.  After discussing the procedure in great detail, she would like to proceed with surgery on May 15.  Currently, she has an appointment scheduled for May 8, to be seen in [place].  We do not need to complete this visit, however she has some questions or concerns, I am happy to see her at her next scheduled visit.  Otherwise, I will see her approximately 7-10 days after surgery. ? ? ?Follow-up: ?Return for After surgery: DOS 09/29/2021. ? ? ?Subjective: ? ?Chief Complaint  ?Patient presents with  ? Follow-up  ?  Left thumb follow up; right ring finger and thumb triggering.  Pain to index finger.   ? ? ?History of Present Illness: ?I connected with  Judith [name] on 09/10/21 via telephone and verified that I am speaking with the correct person using two identifiers. ?  ?I discussed the limitations of evaluation and management by telemedicine. The patient expressed understanding and agreed  to proceed. ? ? ?Judith [name] is a 51 y.o. female who I contacted regarding follow-up for her left trigger thumb.  She underwent surgery approximately 10 weeks ago.  The triggering has improved.  She does have some burning type pain close to the incision site.  She does rub this,.  This is gradually improving.  She also continues to have triggering of the right index and thumb.  This is worsening.  In addition, she notes some tenderness at the base of the index finger.  This has progressed since I saw her last.  We have previously discussed proceeding with surgery to the right hand, and she is interested. ? ?Review of Systems: ?No fevers or chills ?No numbness or tingling ?No chest pain ?No shortness of breath ?No bowel or bladder dysfunction ?No GI distress ?No headaches ? ? ?Objective: ?No vital signs ? ?Physical Exam: ? ?Telephone consult - No exam  ? ?IMAGING: ?I personally ordered and reviewed the following images: ? ?No new imaging today ? ?Mordecai Rasmussen, MD ?09/10/2021 ?11:06 AM ? ? ? ?

## 2021-09-10 ENCOUNTER — Ambulatory Visit: Payer: Self-pay | Admitting: Orthopedic Surgery

## 2021-09-10 ENCOUNTER — Encounter: Payer: Self-pay | Admitting: Orthopedic Surgery

## 2021-09-10 DIAGNOSIS — M65311 Trigger thumb, right thumb: Secondary | ICD-10-CM

## 2021-09-10 DIAGNOSIS — Z01818 Encounter for other preprocedural examination: Secondary | ICD-10-CM

## 2021-09-10 DIAGNOSIS — M65341 Trigger finger, right ring finger: Secondary | ICD-10-CM

## 2021-09-10 DIAGNOSIS — M65321 Trigger finger, right index finger: Secondary | ICD-10-CM

## 2021-09-16 ENCOUNTER — Telehealth: Payer: Self-pay | Admitting: Family Medicine

## 2021-09-16 ENCOUNTER — Ambulatory Visit: Payer: Medicaid Other | Attending: Neurosurgery | Admitting: *Deleted

## 2021-09-16 DIAGNOSIS — Z794 Long term (current) use of insulin: Secondary | ICD-10-CM

## 2021-09-16 DIAGNOSIS — M542 Cervicalgia: Secondary | ICD-10-CM | POA: Insufficient documentation

## 2021-09-16 MED ORDER — FREESTYLE LIBRE 3 SENSOR MISC: 2.0000 | 5 refills | Status: DC

## 2021-09-16 NOTE — Telephone Encounter (Signed)
Lm making pt aware

## 2021-09-16 NOTE — Therapy (Signed)
[facility] ?[place] ?[place], [place], [address] ?Phone: [phone]   Fax:  [phone] ? ?Physical Therapy Treatment ? ?Patient Details  ?Name: Judith [name] ?MRN: [id number] ?Date of Birth: [birth year] ?Referring Provider (PT): Marcello Moores, MD ? ? ?Encounter Date: 09/16/2021 ? ? PT End of Session - 09/16/21 1121   ? ? Visit Number 2   ? Number of Visits 10   ? Date for PT Re-Evaluation 11/14/21   ? PT Start Time 1115   ? PT Stop Time 1205   ? PT Time Calculation (min) 50 min   ? Activity Tolerance Patient tolerated treatment well   ? Behavior During Therapy [facility] for tasks assessed/performed   ? ?  ?  ? ?  ? ? ?Past Medical History:  ?Diagnosis Date  ? Allergy   ? Anxiety   ? Arthritis   ? Asthma   ? Bipolar 1 disorder ([place])   ? Blood transfusion without reported diagnosis   ? with child birth 50 yrs ago   ? Bronchitis, chronic ([place])   ? Constipation   ? COVID-19 virus infection 06/21/2020  ? DDD (degenerative disc disease), cervical   ? Depression   ? Diabetes mellitus without complication ([place])   ? Dysphagia   ? Family history of adverse reaction to anesthesia   ? mother had a hard time waking up after knee surgery in 2021  ? Gastroparesis   ? GERD (gastroesophageal reflux disease)   ? History of kidney stones   ? History of MRSA infection   ? Hyperlipemia   ? Hypothyroid   ? Migraines   ? Pneumonia   ? Sleep apnea   ? no cpap   ? ? ?Past Surgical History:  ?Procedure Laterality Date  ? ABDOMINAL SURGERY    ? ANTERIOR CERVICAL DECOMP/DISCECTOMY FUSION N/A 10/09/2020  ? Procedure: Anterior Cervical Discectomy Fusion Cervical five-six;  Surgeon: Vallarie Mare, MD;  Location: [facility];  Service: Neurosurgery;  Laterality: N/A;  ? BIOPSY  02/04/2021  ? Procedure: BIOPSY;  Surgeon: Yetta Flock, MD;  Location: [facility];  Service: Gastroenterology;;  ? BOTOX INJECTION N/A 02/04/2021  ? Procedure: BOTOX INJECTION;  Surgeon: Yetta Flock, MD;  Location: [facility];  Service: Gastroenterology;  Laterality: N/A;  ? CARPAL TUNNEL RELEASE Bilateral 1999  ? CESAREAN SECTION    ? CHOLECYSTECTOMY    ? COLONOSCOPY    ? ESOPHAGEAL MANOMETRY N/A 07/08/2020  ? Procedure: ESOPHAGEAL MANOMETRY (EM);  Surgeon: Yetta Flock, MD;  Location: [facility];  Service: Gastroenterology;  Laterality: N/A;  ? ESOPHAGOGASTRODUODENOSCOPY (EGD) WITH PROPOFOL N/A 02/04/2021  ? Procedure: ESOPHAGOGASTRODUODENOSCOPY (EGD) WITH PROPOFOL;  Surgeon: Yetta Flock, MD;  [facility];  Service: Gastroenterology;  Laterality: N/A;  ? REPLACEMENT TOTAL KNEE Bilateral   ? SCLEROTHERAPY  02/04/2021  ? Procedure: SCLEROTHERAPY;  Surgeon: Yetta Flock, MD;  Location: [facility];  Service: Gastroenterology;;  ? THYROIDECTOMY    ? TONSILLECTOMY    ? TONSILLECTOMY AND ADENOIDECTOMY    ? TRIGGER FINGER RELEASE Left 06/16/2021  ? Procedure: LEFT THUMB TRIGGER FINGER RELEASE;  Surgeon: Mordecai Rasmussen, MD;  [facility];  Service: Orthopedics;  Laterality: Left;  ? UPPER GASTROINTESTINAL ENDOSCOPY    ? ? ?There were no vitals filed for this visit. ? ? Subjective Assessment - 09/16/21 1110   ? ? Subjective Pt arrived today doing about the same with cervical pain and UE weakness. Neck surgery was Last year Dr  Thomas. Constant H/A is CC complaint   ? Pertinent History trigger finger (both hands),   ? Patient Stated Goals reduced pain, strengthening arms   ? Currently in Pain? Yes   ? Pain Score 4    ? Pain Location Neck   ? Pain Orientation Left   ? Pain Onset More than a month ago   ? ?  ?  ? ?  ? ? ? ? ? ? ? ? ? ? ? ? ? ? ? ? ? ? ? ? [facility] Adult PT Treatment/Exercise - 09/16/21 0001   ? ?  ? Exercises  ? Exercises Shoulder;Neck   ?  ? Neck Exercises: Machines for Strengthening  ? Nustep L3 x 5 mins   ?  ? Neck Exercises: Standing  ? Other Standing Exercises H-ABD red tband 2x10 AND ROWS 2X10   ?  ? Neck Exercises: Seated  ? Neck Retraction 10 reps   ? Cervical Rotation 10 reps    ?  ? Manual Therapy  ? Manual Therapy Soft tissue mobilization   ? Soft tissue mobilization Suboccipital release and STW to Bil cervical paras with TPR x 24 mins with Pt supine   ? ?  ?  ? ?  ? ? ? ? ? ? ? ? ? ? ? ? ? ? ? PT Long Term Goals - 08/20/21 1202   ? ?  ? PT LONG TERM GOAL #1  ? Title Patient will be independent with her HEP.   ? Time 5   ? Period Weeks   ? Status New   ? Target Date 09/24/21   ?  ? PT LONG TERM GOAL #2  ? Title Patient will be able to demonstrate at least 40 degrees of cervical rotation bilaterally for improved safety driving.   ? Time 5   ? Period Weeks   ? Status New   ? Target Date 09/24/21   ?  ? PT LONG TERM GOAL #3  ? Title Patient will be able complete her normal housework without her familiar cervical pain exceeding 6/10.   ? Time 5   ? Period Weeks   ? Status New   ? Target Date 09/24/21   ?  ? PT LONG TERM GOAL #4  ? Title Patient will improve her bilateral shoulder flexion strength to at least 4/5 for improved function lifting.   ? Time 5   ? Period Weeks   ? Status New   ? Target Date 09/24/21   ? ?  ?  ? ?  ? ? ? ? ? ? ? ? Plan - 09/16/21 1113   ? ? Clinical Impression Statement Pt arrived today doing fair, but reports having a constant H/A. Rx focused on cerv. exs as well as scap. stab. exercises. Thes e were f/b OA release and STW to cerv paras with Pt supine. Pt did well with Rx and had some relief after session.   ? Personal Factors and Comorbidities Time since onset of injury/illness/exacerbation;Comorbidity 3+   ? Comorbidities HTN, asthma, chronic back pain   ? Examination-Activity Limitations Carry;Lift;Other   ? Examination-Participation Restrictions Occupation;Cleaning   ? Stability/Clinical Decision Making Unstable/Unpredictable   ? Rehab Potential Fair   ? PT Frequency 2x / week   ? PT Duration Other (comment)   ? PT Treatment/Interventions Neuromuscular re-education;Therapeutic exercise;Therapeutic activities;Manual techniques;Patient/family education   ? PT  Next Visit Plan nustep, scapular stabilization, cervical AROM, STM to cervical spine, and upper extremity strengthening   ?  Consulted and Agree with Plan of Care Patient   ? ?  ?  ? ?  ? ? ?Patient will benefit from skilled therapeutic intervention in order to improve the following deficits and impairments:  Decreased range of motion, Impaired UE functional use, Decreased activity tolerance, Pain, Hypomobility, Decreased strength, Postural dysfunction ? ?Visit Diagnosis: ?Cervicalgia ? ? ? ? ?Problem List ?Patient Active Problem List  ? Diagnosis Date Noted  ? Esophagogastric junction outflow obstruction   ? Gastric polyp   ? Cervical myelopathy ([place]) 10/09/2020  ? Mild intermittent asthma with acute exacerbation 10/04/2020  ? Dysphagia   ? Patellofemoral pain syndrome of both knees 04/19/2020  ? Tinnitus of right ear 03/15/2020  ? Closed fracture of one rib of left side 03/15/2020  ? Concussion with no loss of consciousness 02/05/2020  ? Spondylosis of cervical spine 02/05/2020  ? Acute bilateral thoracic back pain 02/05/2020  ? Acute bilateral low back pain without sciatica 02/05/2020  ? Gastroparesis 11/03/2019  ? Hospital discharge follow-up 11/03/2019  ? DDD (degenerative disc disease), lumbar 06/30/2019  ? Lumbar back pain with radiculopathy affecting lower extremity 06/20/2019  ? 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 History of left knee replacement 02/10/2018  ? Morbid obesity ([place]) 11/21/2017  ? 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 Episodic mood disorder ([place]) 05/12/2016  ? Controlled diabetes mellitus type 2 with complications ([place]) 19/75/8832  ? Gastroesophageal reflux disease without esophagitis 05/12/2016  ? DYSPNEA 07/22/2010  ? CHEST PAIN 07/22/2010  ? Hypothyroidism 09/13/2007  ? Hyperlipidemia 09/13/2007  ? Depression, major, single episode, moderate ([place]) 09/13/2007  ? MIGRAINE HEADACHE 09/13/2007  ? Essential hypertension 09/13/2007  ? Asthma  09/13/2007  ? CHOLELITHIASIS, HX OF 09/13/2007  ? NEPHROLITHIASIS, HX OF 09/13/2007  ? CARPAL TUNNEL RELEASE, HX OF 09/13/2007  ? ? ?Sariah Henkin,CHRIS, PTA ?09/16/2021, 6:26 PM ? ?[facility]

## 2021-09-16 NOTE — Telephone Encounter (Signed)
Please let patient know: ?PA was approved on 4/12 for libre 3 ?RX should already be at [facility] [place] ?New rX called in for [place] 3 just in case ?

## 2021-09-16 NOTE — Telephone Encounter (Signed)
Patient wants to know if we can get her a Elenor [place] 3 sensor because they work better than the [employer] 2. Please call back,.  ?

## 2021-09-19 ENCOUNTER — Ambulatory Visit: Payer: Medicaid Other | Admitting: Family Medicine

## 2021-09-19 ENCOUNTER — Encounter: Payer: Medicaid Other | Admitting: *Deleted

## 2021-09-22 ENCOUNTER — Encounter: Payer: Medicaid Other | Admitting: Orthopedic Surgery

## 2021-09-23 ENCOUNTER — Ambulatory Visit: Payer: Medicaid Other | Admitting: *Deleted

## 2021-09-23 ENCOUNTER — Telehealth: Payer: Self-pay | Admitting: Family Medicine

## 2021-09-23 DIAGNOSIS — M542 Cervicalgia: Secondary | ICD-10-CM

## 2021-09-23 NOTE — Telephone Encounter (Signed)
Pt sheduled

## 2021-09-23 NOTE — Therapy (Signed)
[place] [facility] ?[place] ?[place], [place], [address] ?Phone: [phone]   Fax:  [phone] ? ?Physical Therapy Treatment ? ?Patient Details  ?Name: Judith [name] ?MRN: [id number] ?Date of Birth: 09/17/1970 ?Referring Provider (PT): Marcello Moores, MD ? ? ?Encounter Date: 09/23/2021 ? ? PT End of Session - 09/23/21 1132   ? ? Visit Number 3   ? Number of Visits 10   ? Date for PT Re-Evaluation 11/14/21   ? PT Start Time 1115   ? PT Stop Time 1779   ? PT Time Calculation (min) 49 min   ? ?  ?  ? ?  ? ? ?Past Medical History:  ?Diagnosis Date  ? Allergy   ? Anxiety   ? Arthritis   ? Asthma   ? Bipolar 1 disorder ([place])   ? Blood transfusion without reported diagnosis   ? with child birth 6 yrs ago   ? Bronchitis, chronic ([place])   ? Constipation   ? COVID-19 virus infection 06/21/2020  ? DDD (degenerative disc disease), cervical   ? Depression   ? Diabetes mellitus without complication ([place])   ? Dysphagia   ? Family history of adverse reaction to anesthesia   ? mother had a hard time waking up after knee surgery in 2021  ? Gastroparesis   ? GERD (gastroesophageal reflux disease)   ? History of kidney stones   ? History of MRSA infection   ? Hyperlipemia   ? Hypothyroid   ? Migraines   ? Pneumonia   ? Sleep apnea   ? no cpap   ? ? ?Past Surgical History:  ?Procedure Laterality Date  ? ABDOMINAL SURGERY    ? ANTERIOR CERVICAL DECOMP/DISCECTOMY FUSION N/A 10/09/2020  ? Procedure: Anterior Cervical Discectomy Fusion Cervical five-six;  Surgeon: Vallarie Mare, MD;  Location: [facility];  Service: Neurosurgery;  Laterality: N/A;  ? BIOPSY  02/04/2021  ? Procedure: BIOPSY;  Surgeon: Yetta Flock, MD;  Location: [facility];  Service: Gastroenterology;;  ? BOTOX INJECTION N/A 02/04/2021  ? Procedure: BOTOX INJECTION;  Surgeon: Yetta Flock, MD;  Location: [facility];  Service: Gastroenterology;  Laterality: N/A;  ? CARPAL TUNNEL RELEASE Bilateral 1999  ? CESAREAN SECTION    ?  CHOLECYSTECTOMY    ? COLONOSCOPY    ? ESOPHAGEAL MANOMETRY N/A 07/08/2020  ? Procedure: ESOPHAGEAL MANOMETRY (EM);  Surgeon: Yetta Flock, MD;  Location: [facility];  Service: Gastroenterology;  Laterality: N/A;  ? ESOPHAGOGASTRODUODENOSCOPY (EGD) WITH PROPOFOL N/A 02/04/2021  ? Procedure: ESOPHAGOGASTRODUODENOSCOPY (EGD) WITH PROPOFOL;  Surgeon: Yetta Flock, MD;  [facility];  Service: Gastroenterology;  Laterality: N/A;  ? REPLACEMENT TOTAL KNEE Bilateral   ? SCLEROTHERAPY  02/04/2021  ? Procedure: SCLEROTHERAPY;  Surgeon: Yetta Flock, MD;  Location: [facility];  Service: Gastroenterology;;  ? THYROIDECTOMY    ? TONSILLECTOMY    ? TONSILLECTOMY AND ADENOIDECTOMY    ? TRIGGER FINGER RELEASE Left 06/16/2021  ? Procedure: LEFT THUMB TRIGGER FINGER RELEASE;  Surgeon: Mordecai Rasmussen, MD;  [facility];  Service: Orthopedics;  Laterality: Left;  ? UPPER GASTROINTESTINAL ENDOSCOPY    ? ? ?There were no vitals filed for this visit. ? ? Subjective Assessment - 09/23/21 1130   ? ? Subjective Did ok after last Rx. Still with H/A   ? Pertinent History trigger finger (both hands),   ? Limitations House hold activities;Lifting   ? Patient Stated Goals reduced pain, strengthening arms   ? Currently  in Pain? Yes   ? Pain Score 4    ? Pain Location Neck   ? Pain Orientation Left   ? Pain Type Chronic pain   ? ?  ?  ? ?  ? ? ? ? ? ? ? ? ? ? ? ? ? ? ? ? ? ? ? ? [facility] Adult PT Treatment/Exercise - 09/23/21 0001   ? ?  ? Exercises  ? Exercises Shoulder;Neck   ?  ? Neck Exercises: Machines for Strengthening  ? Nustep L3 x 6 mins   ?  ? Neck Exercises: Standing  ? Other Standing Exercises H-ABD green tband 2x10 AND ROWS 2X10, shldr ext 2x10   ?  ? Neck Exercises: Seated  ? Neck Retraction 10 reps   ? Cervical Rotation 10 reps   ?  ? Manual Therapy  ? Manual Therapy Soft tissue mobilization   ? Soft tissue mobilization Suboccipital release and STW to Bil cervical paras with TPR x 24 mins with Pt  supine   ? ?  ?  ? ?  ? ? ? ? ? ? ? ? ? ? ? ? ? ? ? PT Long Term Goals - 08/20/21 1202   ? ?  ? PT LONG TERM GOAL #1  ? Title Patient will be independent with her HEP.   ? Time 5   ? Period Weeks   ? Status New   ? Target Date 09/24/21   ?  ? PT LONG TERM GOAL #2  ? Title Patient will be able to demonstrate at least 40 degrees of cervical rotation bilaterally for improved safety driving.   ? Time 5   ? Period Weeks   ? Status New   ? Target Date 09/24/21   ?  ? PT LONG TERM GOAL #3  ? Title Patient will be able complete her normal housework without her familiar cervical pain exceeding 6/10.   ? Time 5   ? Period Weeks   ? Status New   ? Target Date 09/24/21   ?  ? PT LONG TERM GOAL #4  ? Title Patient will improve her bilateral shoulder flexion strength to at least 4/5 for improved function lifting.   ? Time 5   ? Period Weeks   ? Status New   ? Target Date 09/24/21   ? ?  ?  ? ?  ? ? ? ? ? ? ? ? Plan - 09/23/21 1709   ? ? Clinical Impression Statement Pt arrived today doing fairly well and reports doing good after last week for the rest of the day. Today she was still having a H/A and sore along suboccipital area both sides. She was able to perform postural exs  f/b STW and OA release. Pt had good TPR LT and RT UTs as well.   ? Personal Factors and Comorbidities Time since onset of injury/illness/exacerbation;Comorbidity 3+   ? Comorbidities HTN, asthma, chronic back pain   ? Examination-Activity Limitations Carry;Lift;Other   ? Examination-Participation Restrictions Occupation;Cleaning   ? Stability/Clinical Decision Making Unstable/Unpredictable   ? Rehab Potential Fair   ? PT Frequency 2x / week   ? PT Duration Other (comment)   ? PT Treatment/Interventions Neuromuscular re-education;Therapeutic exercise;Therapeutic activities;Manual techniques;Patient/family education   ? PT Next Visit Plan nustep, scapular stabilization, cervical AROM, STM to cervical spine, and upper extremity strengthening   ? Consulted and  Agree with Plan of Care Patient   ? ?  ?  ? ?  ? ? ?  Patient will benefit from skilled therapeutic intervention in order to improve the following deficits and impairments:  Decreased range of motion, Impaired UE functional use, Decreased activity tolerance, Pain, Hypomobility, Decreased strength, Postural dysfunction ? ?Visit Diagnosis: ?Cervicalgia ? ? ? ? ?Problem List ?Patient Active Problem List  ? Diagnosis Date Noted  ? Esophagogastric junction outflow obstruction   ? Gastric polyp   ? Cervical myelopathy ([place]) 10/09/2020  ? Mild intermittent asthma with acute exacerbation 10/04/2020  ? Dysphagia   ? Patellofemoral pain syndrome of both knees 04/19/2020  ? Tinnitus of right ear 03/15/2020  ? Closed fracture of one rib of left side 03/15/2020  ? Concussion with no loss of consciousness 02/05/2020  ? Spondylosis of cervical spine 02/05/2020  ? Acute bilateral thoracic back pain 02/05/2020  ? Acute bilateral low back pain without sciatica 02/05/2020  ? Gastroparesis 11/03/2019  ? Hospital discharge follow-up 11/03/2019  ? DDD (degenerative disc disease), lumbar 06/30/2019  ? Lumbar back pain with radiculopathy affecting lower extremity 06/20/2019  ? 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 History of left knee replacement 02/10/2018  ? Morbid obesity ([place]) 11/21/2017  ? 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 Episodic mood disorder ([place]) 05/12/2016  ? Controlled diabetes mellitus type 2 with complications ([place]) 63/33/5456  ? Gastroesophageal reflux disease without esophagitis 05/12/2016  ? DYSPNEA 07/22/2010  ? CHEST PAIN 07/22/2010  ? Hypothyroidism 09/13/2007  ? Hyperlipidemia 09/13/2007  ? Depression, major, single episode, moderate ([place]) 09/13/2007  ? MIGRAINE HEADACHE 09/13/2007  ? Essential hypertension 09/13/2007  ? Asthma 09/13/2007  ? CHOLELITHIASIS, HX OF 09/13/2007  ? NEPHROLITHIASIS, HX OF 09/13/2007  ? CARPAL TUNNEL RELEASE, HX OF 09/13/2007   ? ? ?Tieasha Larsen,CHRIS, PTA ?09/23/2021, 5:21 PM ? ?[facility] ?[place] ?[place], [place], [address] ?Phone: [phone]   Fax:  [phone] ? ?Name: HALIEGH KHURANA ?MRN: [id number]

## 2021-09-23 NOTE — Patient Instructions (Signed)
? Your procedure is scheduled on: 09/29/2021 ? Report to [facility] at   6:00  AM. ? Call this number if you have problems the morning of surgery: [phone] ? ? Remember: ? ? Do not Eat or Drink after midnight except for zero Gaterade at 3:30 am ? ?      No Smoking the morning of surgery ? : ? Take these medicines the morning of surgery with A SIP OF WATER: Gabapentin, lamotrigine, levothyroxine, Zoloft, Baclofen and zyrtec if needed ? ?No diabetic medications am of procedure ? ?Take only 1/2 dose of Tresiba 43 units the night before surgery ? ? ? ? ? ? ? ? Do not wear jewelry, make-up or nail polish. ? Do not wear lotions, powders, or perfumes. You may wear deodorant. ? Do not shave 48 hours prior to surgery. Men may shave face and neck. ? Do not bring valuables to the hospital. ? Contacts, dentures or bridgework may not be worn into surgery. ? Leave suitcase in the car. After surgery it may be brought to your room. ? For patients admitted to the hospital, checkout time is 11:00 AM the day of discharge. ? ? Patients discharged the day of surgery will not be allowed to drive home. ?  ? Special Instructions: Shower using CHG night before surgery and shower the day of surgery use CHG.  Use special wash - you have one bottle of CHG for all showers.  You should use approximately 1/2 of the bottle for each shower.  ?How to Use Chlorhexidine for Bathing ?Chlorhexidine gluconate (CHG) is a germ-killing (antiseptic) solution that is used to clean the skin. It can get rid of the bacteria that normally live on the skin and can keep them away for about 24 hours. To clean your skin with CHG, you may be given: ?A CHG solution to use in the shower or as part of a sponge bath. ?A prepackaged cloth that contains CHG. ?Cleaning your skin with CHG may help lower the risk for infection: ?While you are staying in the intensive care unit of the hospital. ?If you have a vascular access, such as a central line, to provide  short-term or long-term access to your veins. ?If you have a catheter to drain urine from your bladder. ?If you are on a ventilator. A ventilator is a machine that helps you breathe by moving air in and out of your lungs. ?After surgery. ?What are the risks? ?Risks of using CHG include: ?A skin reaction. ?Hearing loss, if CHG gets in your ears and you have a perforated eardrum. ?Eye injury, if CHG gets in your eyes and is not rinsed out. ?The CHG product catching fire. ?Make sure that you avoid smoking and flames after applying CHG to your skin. ?Do not use CHG: ?If you have a chlorhexidine allergy or have previously reacted to chlorhexidine. ?On babies younger than 51 months of age. ?How to use CHG solution ?Use CHG only as told by your health care provider, and follow the instructions on the label. ?Use the full amount of CHG as directed. Usually, this is one bottle. ?During a shower ?Follow these steps when using CHG solution during a shower (unless your health care provider gives you different instructions): ?Start the shower. ?Use your normal soap and shampoo to wash your face and hair. ?Turn off the shower or move out of the shower stream. ?Pour the CHG onto a clean washcloth. Do not use any type of brush or rough-edged  sponge. ?Starting at your neck, lather your body down to your toes. Make sure you follow these instructions: ?If you will be having surgery, pay special attention to the part of your body where you will be having surgery. Scrub this area for at least 1 minute. ?Do not use CHG on your head or face. If the solution gets into your ears or eyes, rinse them well with water. ?Avoid your genital area. ?Avoid any areas of skin that have broken skin, cuts, or scrapes. ?Scrub your back and under your arms. Make sure to wash skin folds. ?Let the lather sit on your skin for 1-2 minutes or as long as told by your health care provider. ?Thoroughly rinse your entire body in the shower. Make sure that all body  creases and crevices are rinsed well. ?Dry off with a clean towel. Do not put any substances on your body afterward--such as powder, lotion, or perfume--unless you are told to do so by your health care provider. Only use lotions that are recommended by the manufacturer. ?Put on clean clothes or pajamas. ?If it is the night before your surgery, sleep in clean sheets. ? ?During a sponge bath ?Follow these steps when using CHG solution during a sponge bath (unless your health care provider gives you different instructions): ?Use your normal soap and shampoo to wash your face and hair. ?Pour the CHG onto a clean washcloth. ?Starting at your neck, lather your body down to your toes. Make sure you follow these instructions: ?If you will be having surgery, pay special attention to the part of your body where you will be having surgery. Scrub this area for at least 1 minute. ?Do not use CHG on your head or face. If the solution gets into your ears or eyes, rinse them well with water. ?Avoid your genital area. ?Avoid any areas of skin that have broken skin, cuts, or scrapes. ?Scrub your back and under your arms. Make sure to wash skin folds. ?Let the lather sit on your skin for 1-2 minutes or as long as told by your health care provider. ?Using a different clean, wet washcloth, thoroughly rinse your entire body. Make sure that all body creases and crevices are rinsed well. ?Dry off with a clean towel. Do not put any substances on your body afterward--such as powder, lotion, or perfume--unless you are told to do so by your health care provider. Only use lotions that are recommended by the manufacturer. ?Put on clean clothes or pajamas. ?If it is the night before your surgery, sleep in clean sheets. ?How to use CHG prepackaged cloths ?Only use CHG cloths as told by your health care provider, and follow the instructions on the label. ?Use the CHG cloth on clean, dry skin. ?Do not use the CHG cloth on your head or face unless  your health care provider tells you to. ?When washing with the CHG cloth: ?Avoid your genital area. ?Avoid any areas of skin that have broken skin, cuts, or scrapes. ?Before surgery ?Follow these steps when using a CHG cloth to clean before surgery (unless your health care provider gives you different instructions): ?Using the CHG cloth, vigorously scrub the part of your body where you will be having surgery. Scrub using a back-and-forth motion for 3 minutes. The area on your body should be completely wet with CHG when you are done scrubbing. ?Do not rinse. Discard the cloth and let the area air-dry. Do not put any substances on the area afterward, such as powder,  lotion, or perfume. ?Put on clean clothes or pajamas. ?If it is the night before your surgery, sleep in clean sheets. ? ?For general bathing ?Follow these steps when using CHG cloths for general bathing (unless your health care provider gives you different instructions). ?Use a separate CHG cloth for each area of your body. Make sure you wash between any folds of skin and between your fingers and toes. Wash your body in the following order, switching to a new cloth after each step: ?The front of your neck, shoulders, and chest. ?Both of your arms, under your arms, and your hands. ?Your stomach and groin area, avoiding the genitals. ?Your right leg and foot. ?Your left leg and foot. ?The back of your neck, your back, and your buttocks. ?Do not rinse. Discard the cloth and let the area air-dry. Do not put any substances on your body afterward--such as powder, lotion, or perfume--unless you are told to do so by your health care provider. Only use lotions that are recommended by the manufacturer. ?Put on clean clothes or pajamas. ?Contact a health care provider if: ?Your skin gets irritated after scrubbing. ?You have questions about using your solution or cloth. ?You swallow any chlorhexidine. Call your local poison control center ([phone] in the  [place] ?Get help right away if: ?Your eyes itch badly, or they become very red or swollen. ?Your skin itches badly and is red or swollen. ?Your hearing changes. ?You have trouble seeing. ?You have swelling o

## 2021-09-25 ENCOUNTER — Encounter: Payer: Medicaid Other | Admitting: Physical Therapy

## 2021-09-25 ENCOUNTER — Encounter (HOSPITAL_COMMUNITY)
Admission: RE | Admit: 2021-09-25 | Discharge: 2021-09-25 | Disposition: A | Discharge status: Home / Self Care | Payer: Medicaid Other | Source: Ambulatory Visit | Attending: Orthopedic Surgery | Admitting: Orthopedic Surgery

## 2021-09-25 DIAGNOSIS — Z01812 Encounter for preprocedural laboratory examination: Secondary | ICD-10-CM | POA: Diagnosis not present

## 2021-09-25 DIAGNOSIS — Z01818 Encounter for other preprocedural examination: Secondary | ICD-10-CM

## 2021-09-26 ENCOUNTER — Encounter: Payer: Self-pay | Admitting: Family Medicine

## 2021-09-26 ENCOUNTER — Ambulatory Visit: Payer: Medicaid Other | Admitting: Family Medicine

## 2021-09-26 ENCOUNTER — Ambulatory Visit: Payer: Medicaid Other | Admitting: *Deleted

## 2021-09-26 VITALS — BP 147/73 | HR 94 | Temp 97.3°F | Ht 68.0 in | Wt 307.8 lb

## 2021-09-26 DIAGNOSIS — M65341 Trigger finger, right ring finger: Secondary | ICD-10-CM

## 2021-09-26 DIAGNOSIS — M542 Cervicalgia: Secondary | ICD-10-CM

## 2021-09-26 DIAGNOSIS — G8918 Other acute postprocedural pain: Secondary | ICD-10-CM

## 2021-09-26 MED ORDER — HYDROCODONE-ACETAMINOPHEN 5-325 MG PO TABS: 1.0000 | ORAL_TABLET | ORAL | 0 refills | Status: DC | PRN

## 2021-09-26 NOTE — Progress Notes (Signed)
? ?Subjective: ?CC: Postop pain ?PCP: Janora Norlander, DO ?HPI:Judith [name] is a 51 y.o. female presenting to clinic today for: ? ?1.  Postop pain ?Patient here for prescription of pain medication for postop pain.  She will be undergoing hand surgery on the right for multiple trigger fingers with Dr. Amedeo Kinsman on Monday.  The last time she had this on the left side she had difficulties obtaining her postop pain medication due to Medicaid restrictions. ? ? ?ROS: Per HPI ? ?Allergies  ?Allergen Reactions  ? Diphenhydramine Other (See Comments)  ?  Hair feels like it is crawling  ? Morphine Nausea And Vomiting  ?  PCA PUMP/ DRIP -- N/V ?IV PUSH IN ER NO PROBLEM PER PT.    ? Prednisone Nausea And Vomiting  ? Adhesive [Tape]   ?  Paper tape causes irritation   ? Erythromycin Hives  ? Metformin And Related   ?  Diarrhea with IR and XR products  ? Sulfonamide Derivatives Other (See Comments)  ?  Does not take due to family history  ? ?Past Medical History:  ?Diagnosis Date  ? Allergy   ? Anxiety   ? Arthritis   ? Asthma   ? Bipolar 1 disorder ([place])   ? Blood transfusion without reported diagnosis   ? with child birth 39 yrs ago   ? Bronchitis, chronic ([place])   ? Constipation   ? COVID-19 virus infection 06/21/2020  ? DDD (degenerative disc disease), cervical   ? Depression   ? Diabetes mellitus without complication ([place])   ? Dysphagia   ? Family history of adverse reaction to anesthesia   ? mother had a hard time waking up after knee surgery in 2021  ? Gastroparesis   ? GERD (gastroesophageal reflux disease)   ? History of kidney stones   ? History of MRSA infection   ? Hyperlipemia   ? Hypothyroid   ? Migraines   ? Pneumonia   ? Sleep apnea   ? no cpap   ? ? ?Current Outpatient Medications:  ?  Accu-Chek FastClix Lancets MISC, Test blood sugars daily Dx E11.9, Disp: 102 each, Rfl: 3 ?  albuterol (PROVENTIL) (2.5 MG/3ML) 0.083% nebulizer solution, Take 3 mLs (2.5 mg total) by nebulization every 6 (six) hours as needed for  wheezing or shortness of breath. Fill upon patient request, Disp: 75 mL, Rfl: 12 ?  albuterol (VENTOLIN HFA) 108 (90 Base) MCG/ACT inhaler, Inhale 2 puffs into the lungs every 6 (six) hours as needed for wheezing or shortness of breath. Fill upon patient request, Disp: 8 g, Rfl: 1 ?  baclofen (LIORESAL) 10 MG tablet, TAKE 0.5-1 TABLETS BY MOUTH 3 TIMES DAILY AS NEEDED FOR MUSCLE SPASMS. (Patient taking differently: Take 10 mg by mouth 3 (three) times daily.), Disp: 90 tablet, Rfl: 12 ?  Blood Glucose Monitoring Suppl (ACCU-CHEK GUIDE ME) w/Device KIT, Use to check BS BID and as needed. DX: E11.9, Disp: 1 kit, Rfl: 0 ?  budesonide-formoterol (SYMBICORT) 160-4.5 MCG/ACT inhaler, Inhale 2 puffs into the lungs 2 (two) times daily. (Patient taking differently: Inhale 2 puffs into the lungs 2 (two) times daily as needed (shortness of breath).), Disp: 1 each, Rfl: 3 ?  cetirizine (ZYRTEC) 10 MG tablet, Take 10 mg by mouth daily as needed for allergies., Disp: , Rfl:  ?  Continuous Blood Gluc Sensor (FREESTYLE LIBRE 3 SENSOR) MISC, 2 each by Does not apply route every 14 (fourteen) days. Place 1 sensor on the skin every  14 days. Use to check glucose continuously, Disp: 2 each, Rfl: 5 ?  Cyanocobalamin (B-12 PO), Take 1 tablet by mouth See admin instructions. Take 1 tablet daily, may take a second tablet as needed for energy, Disp: , Rfl:  ?  cycloSPORINE (RESTASIS) 0.05 % ophthalmic emulsion, Place 1 drop into both eyes 2 (two) times daily., Disp: , Rfl:  ?  dexlansoprazole (DEXILANT) 60 MG capsule, Take 1 capsule (60 mg total) by mouth daily. (Patient taking differently: Take 60 mg by mouth at bedtime.), Disp: 90 capsule, Rfl: 3 ?  diclofenac Sodium (VOLTAREN) 1 % GEL, APPLY 4 GRAMS TOPICALLY 4 (FOUR) TIMES DAILY AS NEEDED (KNEE PAIN)., Disp: 400 g, Rfl: 0 ?  dicyclomine (BENTYL) 20 MG tablet, TAKE 1 TABLET (20 MG TOTAL) BY MOUTH EVERY 6 (SIX) HOURS. (Patient taking differently: Take 20 mg by mouth every 6 (six) hours as  needed for spasms (abdominal cramps).), Disp: 360 tablet, Rfl: 0 ?  docusate sodium (COLACE) 100 MG capsule, Take 1 capsule (100 mg total) by mouth 2 (two) times daily. (Patient taking differently: Take 100 mg by mouth 2 (two) times daily as needed for mild constipation.), Disp: 60 capsule, Rfl: 0 ?  gabapentin (NEURONTIN) 300 MG capsule, TAKE 1 CAPSULE BY MOUTH THREE TIMES A DAY, Disp: 270 capsule, Rfl: 0 ?  glucose blood (ACCU-CHEK GUIDE) test strip, Test blood sugars daily Dx E11.9, Disp: 100 strip, Rfl: 3 ?  guaiFENesin (MUCINEX) 600 MG 12 hr tablet, Take 1 tablet (600 mg total) by mouth 2 (two) times daily., Disp: 30 tablet, Rfl: 0 ?  hydrochlorothiazide (HYDRODIURIL) 25 MG tablet, Take 25 mg by mouth daily., Disp: , Rfl:  ?  ibuprofen (ADVIL) 600 MG tablet, Take 1 tablet (600 mg total) by mouth at bedtime as needed. (Patient taking differently: Take 600 mg by mouth See admin instructions. Take 600 mg at bedtime, may take another 600 mg dose up to twice daily as needed for pain), Disp: 30 tablet, Rfl: 3 ?  insulin degludec (TRESIBA FLEXTOUCH) 200 UNIT/ML FlexTouch Pen, Inject 86 Units into the skin at bedtime., Disp: 18 mL, Rfl: 3 ?  Insulin Pen Needle (B-D UF III MINI PEN NEEDLES) 31G X 5 MM MISC, use as directed once daily Dx E11.8, Disp: 100 each, Rfl: 4 ?  lamoTRIgine (LAMICTAL) 150 MG tablet, 1 tablet PO BID (Patient taking differently: Take 300 mg by mouth daily.), Disp: 180 tablet, Rfl: 3 ?  levothyroxine (SYNTHROID) 125 MCG tablet, Take 1 tablet (125 mcg total) by mouth daily., Disp: 90 tablet, Rfl: 1 ?  linaclotide (LINZESS) 72 MCG capsule, Take 1 capsule (72 mcg total) by mouth daily before breakfast. (Patient taking differently: Take 72 mcg by mouth daily as needed (constipation).), Disp: 30 capsule, Rfl: 2 ?  medroxyPROGESTERone (DEPO-PROVERA) 150 MG/ML injection, Inject 1 mL (150 mg total) into the muscle every 3 (three) months., Disp: 1 mL, Rfl: 3 ?  Mepolizumab (NUCALA) 100 MG/ML SOAJ, Inject 1  mL (100 mg total) into the skin every 28 (twenty-eight) days. Deliver to patient's home, Disp: 3 mL, Rfl: 1 ?  montelukast (SINGULAIR) 10 MG tablet, TAKE ONE TAB BY MOUTH AT BEDTIME, Disp: 90 tablet, Rfl: 3 ?  nystatin cream (MYCOSTATIN), Apply 1 application topically 2 (two) times daily. x2 weeks (Patient taking differently: Apply 1 application. topically 2 (two) times daily as needed (rash).), Disp: 60 g, Rfl: 0 ?  ofloxacin (FLOXIN OTIC) 0.3 % OTIC solution, Place 10 drops into the left ear daily., Disp:  5 mL, Rfl: 0 ?  ondansetron (ZOFRAN-ODT) 4 MG disintegrating tablet, TAKE 1-2 TABLETS (4-8 MG TOTAL) BY MOUTH EVERY 8 (EIGHT) HOURS AS NEEDED FOR NAUSEA OR VOMITING., Disp: 30 tablet, Rfl: 0 ?  pioglitazone (ACTOS) 45 MG tablet, TAKE 1 TABLET BY MOUTH EVERY DAY, Disp: 90 tablet, Rfl: 3 ?  promethazine (PHENERGAN) 25 MG tablet, Take 1 tablet (25 mg total) by mouth every 6 (six) hours as needed for nausea or vomiting., Disp: 30 tablet, Rfl: 0 ?  rosuvastatin (CRESTOR) 40 MG tablet, Take 1 tablet (40 mg total) by mouth at bedtime., Disp: 90 tablet, Rfl: 3 ?  Semaglutide (RYBELSUS) 14 MG TABS, Take 1 tablet by mouth daily before breakfast., Disp: 30 tablet, Rfl: prn ?  sertraline (ZOLOFT) 100 MG tablet, Take 2 tablets (200 mg total) by mouth daily., Disp: 180 tablet, Rfl: 3 ?  sodium chloride (OCEAN) 0.65 % SOLN nasal spray, Place 1 spray into both nostrils as needed for congestion., Disp: 30 mL, Rfl: 0 ?  lisdexamfetamine (VYVANSE) 70 MG capsule, Take 1 capsule (70 mg total) by mouth daily. (Patient not taking: Reported on 09/26/2021), Disp: 30 capsule, Rfl: 0 ?  lisdexamfetamine (VYVANSE) 70 MG capsule, Take 1 capsule (70 mg total) by mouth daily. (Patient not taking: Reported on 09/26/2021), Disp: 30 capsule, Rfl: 0 ?  lisdexamfetamine (VYVANSE) 70 MG capsule, Take 1 capsule (70 mg total) by mouth daily. (Patient not taking: Reported on 09/26/2021), Disp: 30 capsule, Rfl: 0 ?Social History  ? ?Socioeconomic History   ? Marital status: Divorced  ?  Spouse name: Not on file  ? Number of children: Not on file  ? Years of education: Not on file  ? Highest education level: Not on file  ?Occupational History  ? Not on fi

## 2021-09-26 NOTE — Therapy (Addendum)
[facility] [place], [place], [address] Phone: [phone]   Fax:  [phone]  Physical Therapy Treatment  Patient Details  Name: Judith [name] MRN: [id number] Date of Birth: 27-Jul-1970 Referring Provider (PT): Marcello Moores, MD   Encounter Date: 09/26/2021   PT End of Session - 09/26/21 1206     Visit Number 4    Number of Visits 10    Date for PT Re-Evaluation 11/14/21    PT Start Time 1122    PT Stop Time 1200    PT Time Calculation (min) 38 min             Past Medical History:  Diagnosis Date   Allergy    Anxiety    Arthritis    Asthma    Bipolar 1 disorder ([place])    Blood transfusion without reported diagnosis    with child birth 89 yrs ago    Bronchitis, chronic ([place])    Constipation    COVID-19 virus infection 06/21/2020   DDD (degenerative disc disease), cervical    Depression    Diabetes mellitus without complication ([place])    Dysphagia    Family history of adverse reaction to anesthesia    mother had a hard time waking up after knee surgery in 2021   Gastroparesis    GERD (gastroesophageal reflux disease)    History of kidney stones    History of MRSA infection    Hyperlipemia    Hypothyroid    Migraines    Pneumonia    Sleep apnea    no cpap     Past Surgical History:  Procedure Laterality Date   ABDOMINAL SURGERY     ANTERIOR CERVICAL DECOMP/DISCECTOMY FUSION N/A 10/09/2020   Procedure: Anterior Cervical Discectomy Fusion Cervical five-six;  Surgeon: Vallarie Mare, MD;  Location: [facility];  Service: Neurosurgery;  Laterality: N/A;   BIOPSY  02/04/2021   Procedure: BIOPSY;  Surgeon: Yetta Flock, MD;  Location: [facility];  Service: Gastroenterology;;   BOTOX INJECTION N/A 02/04/2021   Procedure: BOTOX INJECTION;  Surgeon: Yetta Flock, MD;  Location: [facility];  Service: Gastroenterology;  Laterality: N/A;   CARPAL TUNNEL RELEASE Bilateral 1999   CESAREAN SECTION      CHOLECYSTECTOMY     COLONOSCOPY     ESOPHAGEAL MANOMETRY N/A 07/08/2020   Procedure: ESOPHAGEAL MANOMETRY (EM);  Surgeon: Yetta Flock, MD;  Location: [facility];  Service: Gastroenterology;  Laterality: N/A;   ESOPHAGOGASTRODUODENOSCOPY (EGD) WITH PROPOFOL N/A 02/04/2021   Procedure: ESOPHAGOGASTRODUODENOSCOPY (EGD) WITH PROPOFOL;  Surgeon: Yetta Flock, MD;  Location: [facility];  Service: Gastroenterology;  Laterality: N/A;   REPLACEMENT TOTAL KNEE Bilateral    SCLEROTHERAPY  02/04/2021   Procedure: SCLEROTHERAPY;  Surgeon: Yetta Flock, MD;  Location: [facility];  Service: Gastroenterology;;   THYROIDECTOMY     TONSILLECTOMY     TONSILLECTOMY AND ADENOIDECTOMY     TRIGGER FINGER RELEASE Left 06/16/2021   Procedure: LEFT THUMB TRIGGER FINGER RELEASE;  Surgeon: Mordecai Rasmussen, MD;  Location: [facility];  Service: Orthopedics;  Laterality: Left;   UPPER GASTROINTESTINAL ENDOSCOPY      There were no vitals filed for this visit.   Subjective Assessment - 09/26/21 1203     Subjective LT side still feels ok. RT side is tight again and HA still    Pertinent History trigger finger (both hands),    Limitations House hold activities;Lifting    Patient Stated Goals reduced pain, strengthening  arms    Currently in Pain? Yes    Pain Score 3     Pain Location Neck    Pain Orientation Left    Pain Descriptors / Indicators Burning;Numbness    Pain Type Chronic pain    Pain Onset More than a month ago                               [facility] Adult PT Treatment/Exercise - 09/26/21 0001       Exercises   Exercises Shoulder;Neck      Manual Therapy   Manual Therapy Soft tissue mobilization    Soft tissue mobilization Suboccipital release and STW to Bil cervical paras, RT levator with active release as well as  TPR x 25 mins with Pt supine                          PT Long Term Goals - 08/20/21 1202       PT LONG TERM GOAL #1    Title Patient will be independent with her HEP.    Time 5    Period Weeks    Status New    Target Date 09/24/21      PT LONG TERM GOAL #2   Title Patient will be able to demonstrate at least 40 degrees of cervical rotation bilaterally for improved safety driving.    Time 5    Period Weeks    Status New    Target Date 09/24/21      PT LONG TERM GOAL #3   Title Patient will be able complete her normal housework without her familiar cervical pain exceeding 6/10.    Time 5    Period Weeks    Status New    Target Date 09/24/21      PT LONG TERM GOAL #4   Title Patient will improve her bilateral shoulder flexion strength to at least 4/5 for improved function lifting.    Time 5    Period Weeks    Status New    Target Date 09/24/21                   Plan - 09/26/21 1211     Clinical Impression Statement Pt arrived today doing better after last Rx and had lasting decreased painLT side. RT side  UT and levator with TPs today, and had good release with manual STW/ TPR. Suboccipital release tolerated well.    Personal Factors and Comorbidities Time since onset of injury/illness/exacerbation;Comorbidity 3+    Comorbidities HTN, asthma, chronic back pain    Examination-Activity Limitations Carry;Lift;Other    Examination-Participation Restrictions Occupation;Cleaning    Stability/Clinical Decision Making Unstable/Unpredictable    Rehab Potential Fair    PT Frequency 2x / week    PT Duration Other (comment)    PT Treatment/Interventions Neuromuscular re-education;Therapeutic exercise;Therapeutic activities;Manual techniques;Patient/family education    PT Next Visit Plan nustep, scapular stabilization, cervical AROM, STM to cervical spine, and upper extremity strengthening    Consulted and Agree with Plan of Care Patient             Patient will benefit from skilled therapeutic intervention in order to improve the following deficits and impairments:  Decreased range of  motion, Impaired UE functional use, Decreased activity tolerance, Pain, Hypomobility, Decreased strength, Postural dysfunction  Visit Diagnosis: Cervicalgia     Problem List Patient Active Problem List  Diagnosis Date Noted   Esophagogastric junction outflow obstruction    Gastric polyp    Cervical myelopathy ([place]) 10/09/2020   Mild intermittent asthma with acute exacerbation 10/04/2020   Dysphagia    Patellofemoral pain syndrome of both knees 04/19/2020   Tinnitus of right ear 03/15/2020   Closed fracture of one rib of left side 03/15/2020   Concussion with no loss of consciousness 02/05/2020   Spondylosis of cervical spine 02/05/2020   Acute bilateral thoracic back pain 02/05/2020   Acute bilateral low back pain without sciatica 02/05/2020   Gastroparesis 11/03/2019   Hospital discharge follow-up 11/03/2019   DDD (degenerative disc disease), lumbar 06/30/2019   Lumbar back pain with radiculopathy affecting lower extremity 06/20/2019   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/21/2019   History of left knee replacement 02/10/2018   Morbid obesity ([place]) 11/21/2017   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   Episodic mood disorder ([place]) 05/12/2016   Controlled diabetes mellitus type 2 with complications ([place]) 91/10/8164   Gastroesophageal reflux disease without esophagitis 05/12/2016   DYSPNEA 07/22/2010   CHEST PAIN 07/22/2010   Hypothyroidism 09/13/2007   Hyperlipidemia 09/13/2007   Depression, major, single episode, moderate ([place]) 09/13/2007   MIGRAINE HEADACHE 09/13/2007   Essential hypertension 09/13/2007   Asthma 09/13/2007   CHOLELITHIASIS, HX OF 09/13/2007   NEPHROLITHIASIS, HX OF 09/13/2007   CARPAL TUNNEL RELEASE, HX OF 09/13/2007    Taryll Reichenberger,CHRIS, PTA 09/26/2021, 12:19 PM  [facility] [address] [place], [place], [address] Phone: [phone]   Fax:  [phone]  Name: Judith [name] MRN: [id number] Date of Birth: Feb 17, 1971  PHYSICAL THERAPY DISCHARGE SUMMARY  Visits from Start of Care: 4  Current functional level related to goals / functional outcomes: Patient reported reduced left sided cervical pain at her last appointment. However, she has not return to therapy since her appointment on 09/26/21.   Remaining deficits: Pain and weakness   Education / Equipment: HEP   Patient agrees to discharge. Patient goals were not met. Patient is being discharged due to not returning since the last visit.  Jacqulynn Cadet, PT, DPT

## 2021-09-29 ENCOUNTER — Ambulatory Visit (HOSPITAL_BASED_OUTPATIENT_CLINIC_OR_DEPARTMENT_OTHER): Payer: Medicaid Other | Admitting: Anesthesiology

## 2021-09-29 ENCOUNTER — Ambulatory Visit (HOSPITAL_COMMUNITY): Payer: Medicaid Other | Admitting: Anesthesiology

## 2021-09-29 ENCOUNTER — Ambulatory Visit (HOSPITAL_COMMUNITY)
Admission: RE | Admit: 2021-09-29 | Discharge: 2021-09-29 | Disposition: A | Discharge status: Home / Self Care | Payer: Medicaid Other | Attending: Orthopedic Surgery | Admitting: Orthopedic Surgery

## 2021-09-29 ENCOUNTER — Encounter (HOSPITAL_COMMUNITY): Payer: Self-pay | Admitting: Orthopedic Surgery

## 2021-09-29 ENCOUNTER — Encounter (HOSPITAL_COMMUNITY)
Admission: RE | Disposition: A | Discharge status: Home / Self Care | Payer: Self-pay | Source: Home / Self Care | Attending: Orthopedic Surgery

## 2021-09-29 ENCOUNTER — Other Ambulatory Visit: Payer: Self-pay

## 2021-09-29 DIAGNOSIS — J449 Chronic obstructive pulmonary disease, unspecified: Secondary | ICD-10-CM | POA: Diagnosis not present

## 2021-09-29 DIAGNOSIS — M1990 Unspecified osteoarthritis, unspecified site: Secondary | ICD-10-CM | POA: Diagnosis not present

## 2021-09-29 DIAGNOSIS — Z87891 Personal history of nicotine dependence: Secondary | ICD-10-CM | POA: Insufficient documentation

## 2021-09-29 DIAGNOSIS — I10 Essential (primary) hypertension: Secondary | ICD-10-CM | POA: Insufficient documentation

## 2021-09-29 DIAGNOSIS — M65311 Trigger thumb, right thumb: Secondary | ICD-10-CM | POA: Diagnosis not present

## 2021-09-29 DIAGNOSIS — M65341 Trigger finger, right ring finger: Secondary | ICD-10-CM | POA: Diagnosis not present

## 2021-09-29 DIAGNOSIS — M65321 Trigger finger, right index finger: Secondary | ICD-10-CM | POA: Diagnosis not present

## 2021-09-29 DIAGNOSIS — Z96652 Presence of left artificial knee joint: Secondary | ICD-10-CM

## 2021-09-29 DIAGNOSIS — E119 Type 2 diabetes mellitus without complications: Secondary | ICD-10-CM | POA: Diagnosis not present

## 2021-09-29 DIAGNOSIS — E118 Type 2 diabetes mellitus with unspecified complications: Secondary | ICD-10-CM

## 2021-09-29 HISTORY — PX: TRIGGER FINGER RELEASE: SHX641

## 2021-09-29 LAB — GLUCOSE, CAPILLARY
Glucose-Capillary: 153 mg/dL — ABNORMAL HIGH (ref 70–99)
Glucose-Capillary: 150 mg/dL — ABNORMAL HIGH (ref 70–99)

## 2021-09-29 SURGERY — RELEASE, A1 PULLEY, FOR TRIGGER FINGER
Anesthesia: General | Site: Hand | Laterality: Right

## 2021-09-29 MED ORDER — SODIUM CHLORIDE 0.9 % IR SOLN
Status: DC | PRN
  Administered 2021-09-29: 1000 mL

## 2021-09-29 MED ORDER — ROCURONIUM BROMIDE 10 MG/ML (PF) SYRINGE
PREFILLED_SYRINGE | INTRAVENOUS | Status: DC | PRN
Start: 2021-09-29 — End: 2021-09-29
  Administered 2021-09-29: 40 mg via INTRAVENOUS

## 2021-09-29 MED ORDER — BUPIVACAINE HCL (PF) 0.5 % IJ SOLN
INTRAMUSCULAR | Status: DC | PRN
  Administered 2021-09-29: 10 mL

## 2021-09-29 MED ORDER — FENTANYL CITRATE (PF) 250 MCG/5ML IJ SOLN
INTRAMUSCULAR | Status: DC | PRN
  Administered 2021-09-29 (×3): 50 ug via INTRAVENOUS

## 2021-09-29 MED ORDER — ONDANSETRON HCL 4 MG/2ML IJ SOLN
INTRAMUSCULAR | Status: DC | PRN
  Administered 2021-09-29: 4 mg via INTRAVENOUS

## 2021-09-29 MED ORDER — ROCURONIUM BROMIDE 10 MG/ML (PF) SYRINGE
PREFILLED_SYRINGE | INTRAVENOUS | Status: AC
  Filled 2021-09-29: qty 10

## 2021-09-29 MED ORDER — ORAL CARE MOUTH RINSE: 15.0000 mL | Freq: Once | OROMUCOSAL | Status: DC

## 2021-09-29 MED ORDER — FENTANYL CITRATE (PF) 250 MCG/5ML IJ SOLN
INTRAMUSCULAR | Status: AC
  Filled 2021-09-29: qty 5

## 2021-09-29 MED ORDER — DEXAMETHASONE SODIUM PHOSPHATE 10 MG/ML IJ SOLN
INTRAMUSCULAR | Status: AC
  Filled 2021-09-29: qty 1

## 2021-09-29 MED ORDER — PROPOFOL 10 MG/ML IV BOLUS
INTRAVENOUS | Status: AC
Start: 2021-09-29 — End: ?
  Filled 2021-09-29: qty 20

## 2021-09-29 MED ORDER — SUCCINYLCHOLINE CHLORIDE 200 MG/10ML IV SOSY
PREFILLED_SYRINGE | INTRAVENOUS | Status: DC | PRN
  Administered 2021-09-29: 120 mg via INTRAVENOUS

## 2021-09-29 MED ORDER — KETOROLAC TROMETHAMINE 30 MG/ML IJ SOLN
INTRAMUSCULAR | Status: AC
  Filled 2021-09-29: qty 1

## 2021-09-29 MED ORDER — FENTANYL CITRATE PF 50 MCG/ML IJ SOSY
25.0000 ug | PREFILLED_SYRINGE | INTRAMUSCULAR | Status: DC | PRN
  Administered 2021-09-29: 50 ug via INTRAVENOUS
  Filled 2021-09-29: qty 1

## 2021-09-29 MED ORDER — CHLORHEXIDINE GLUCONATE 0.12 % MT SOLN: 15.0000 mL | Freq: Once | OROMUCOSAL | Status: DC

## 2021-09-29 MED ORDER — BUPIVACAINE HCL (PF) 0.5 % IJ SOLN
INTRAMUSCULAR | Status: AC
  Filled 2021-09-29: qty 30

## 2021-09-29 MED ORDER — MIDAZOLAM HCL 2 MG/2ML IJ SOLN
INTRAMUSCULAR | Status: AC
  Filled 2021-09-29: qty 2

## 2021-09-29 MED ORDER — PROPOFOL 10 MG/ML IV BOLUS
INTRAVENOUS | Status: AC
  Filled 2021-09-29: qty 20

## 2021-09-29 MED ORDER — LIDOCAINE HCL (PF) 2 % IJ SOLN
INTRAMUSCULAR | Status: AC
  Filled 2021-09-29: qty 5

## 2021-09-29 MED ORDER — PROPOFOL 10 MG/ML IV BOLUS
INTRAVENOUS | Status: DC | PRN
  Administered 2021-09-29: 50 mg via INTRAVENOUS
  Administered 2021-09-29: 200 mg via INTRAVENOUS

## 2021-09-29 MED ORDER — ONDANSETRON HCL 4 MG/2ML IJ SOLN
INTRAMUSCULAR | Status: AC
  Filled 2021-09-29: qty 2

## 2021-09-29 MED ORDER — KETOROLAC TROMETHAMINE 30 MG/ML IJ SOLN
INTRAMUSCULAR | Status: DC | PRN
  Administered 2021-09-29: 30 mg via INTRAVENOUS

## 2021-09-29 MED ORDER — ONDANSETRON HCL 4 MG/2ML IJ SOLN: 4.0000 mg | Freq: Once | INTRAMUSCULAR | Status: DC | PRN

## 2021-09-29 MED ORDER — MIDAZOLAM HCL 2 MG/2ML IJ SOLN
INTRAMUSCULAR | Status: DC | PRN
  Administered 2021-09-29: 2 mg via INTRAVENOUS

## 2021-09-29 MED ORDER — SUGAMMADEX SODIUM 200 MG/2ML IV SOLN
INTRAVENOUS | Status: DC | PRN
  Administered 2021-09-29: 300 mg via INTRAVENOUS

## 2021-09-29 MED ORDER — LACTATED RINGERS IV SOLN: INTRAVENOUS | Status: DC

## 2021-09-29 MED ORDER — LIDOCAINE 2% (20 MG/ML) 5 ML SYRINGE
INTRAMUSCULAR | Status: DC | PRN
Start: 2021-09-29 — End: 2021-09-29
  Administered 2021-09-29: 100 mg via INTRAVENOUS

## 2021-09-29 MED ORDER — PHENYLEPHRINE 80 MCG/ML (10ML) SYRINGE FOR IV PUSH (FOR BLOOD PRESSURE SUPPORT)
PREFILLED_SYRINGE | INTRAVENOUS | Status: AC
  Filled 2021-09-29: qty 10

## 2021-09-29 MED ORDER — CEFAZOLIN IN SODIUM CHLORIDE 3-0.9 GM/100ML-% IV SOLN
3.0000 g | INTRAVENOUS | Status: AC
  Administered 2021-09-29: 3 g via INTRAVENOUS
  Filled 2021-09-29: qty 100

## 2021-09-29 MED ORDER — SUCCINYLCHOLINE CHLORIDE 200 MG/10ML IV SOSY
PREFILLED_SYRINGE | INTRAVENOUS | Status: AC
  Filled 2021-09-29: qty 10

## 2021-09-29 MED ORDER — PHENYLEPHRINE 80 MCG/ML (10ML) SYRINGE FOR IV PUSH (FOR BLOOD PRESSURE SUPPORT)
PREFILLED_SYRINGE | INTRAVENOUS | Status: DC | PRN
  Administered 2021-09-29: 80 ug via INTRAVENOUS

## 2021-09-29 SURGICAL SUPPLY — 41 items
APL PRP STRL LF DISP 70% ISPRP (MISCELLANEOUS) ×1
BANDAGE ESMARK 4X12 BL STRL LF (DISPOSABLE) ×1 IMPLANT
BLADE SURG 15 STRL LF DISP TIS (BLADE) ×1 IMPLANT
BLADE SURG 15 STRL SS (BLADE) ×2
BNDG CMPR 12X4 ELC STRL LF (DISPOSABLE) ×1
BNDG CMPR STD VLCR NS LF 5.8X2 (GAUZE/BANDAGES/DRESSINGS) ×1
BNDG CONFORM 2 STRL LF (GAUZE/BANDAGES/DRESSINGS) ×2 IMPLANT
BNDG ELASTIC 2X5.8 VLCR NS LF (GAUZE/BANDAGES/DRESSINGS) ×2 IMPLANT
BNDG ESMARK 4X12 BLUE STRL LF (DISPOSABLE) ×2
CHLORAPREP W/TINT 26 (MISCELLANEOUS) ×2 IMPLANT
CLOTH BEACON ORANGE TIMEOUT ST (SAFETY) ×2 IMPLANT
CORD BIPOLAR FORCEPS 12FT (ELECTRODE) ×2 IMPLANT
COVER LIGHT HANDLE STERIS (MISCELLANEOUS) ×4 IMPLANT
CUFF TOURN SGL QUICK 18X4 (TOURNIQUET CUFF) ×2 IMPLANT
DECANTER SPIKE VIAL GLASS SM (MISCELLANEOUS) ×2 IMPLANT
DRAPE HALF SHEET 40X57 (DRAPES) ×2 IMPLANT
DRAPE SURG 17X23 STRL (DRAPES) ×2 IMPLANT
GAUZE 4X4 16PLY ~~LOC~~+RFID DBL (SPONGE) ×2 IMPLANT
GAUZE SPONGE 4X4 12PLY STRL (GAUZE/BANDAGES/DRESSINGS) ×2 IMPLANT
GAUZE XEROFORM 1X8 LF (GAUZE/BANDAGES/DRESSINGS) ×2 IMPLANT
GLOVE BIOGEL PI IND STRL 7.0 (GLOVE) ×2 IMPLANT
GLOVE BIOGEL PI INDICATOR 7.0 (GLOVE) ×2
GLOVE SRG 8 PF TXTR STRL LF DI (GLOVE) ×1 IMPLANT
GLOVE SURG POLYISO LF SZ8 (GLOVE) ×2 IMPLANT
GLOVE SURG UNDER POLY LF SZ8 (GLOVE) ×2
GOWN STRL REUS W/ TWL XL LVL3 (GOWN DISPOSABLE) ×1 IMPLANT
GOWN STRL REUS W/TWL LRG LVL3 (GOWN DISPOSABLE) ×2 IMPLANT
GOWN STRL REUS W/TWL XL LVL3 (GOWN DISPOSABLE) ×2
KIT TURNOVER KIT A (KITS) ×2 IMPLANT
MANIFOLD NEPTUNE II (INSTRUMENTS) ×2 IMPLANT
NDL HYPO 21X1.5 SAFETY (NEEDLE) ×1 IMPLANT
NEEDLE HYPO 21X1.5 SAFETY (NEEDLE) ×2 IMPLANT
NS IRRIG 1000ML POUR BTL (IV SOLUTION) ×2 IMPLANT
PACK BASIC LIMB (CUSTOM PROCEDURE TRAY) ×2 IMPLANT
PAD ARMBOARD 7.5X6 YLW CONV (MISCELLANEOUS) ×2 IMPLANT
POSITIONER HAND ALUMI XLG (MISCELLANEOUS) ×2 IMPLANT
SET BASIN LINEN APH (SET/KITS/TRAYS/PACK) ×2 IMPLANT
SUT PROLENE NAB BLUE 3-0 30IN (SUTURE) ×2 IMPLANT
SYR 20ML LL LF (SYRINGE) ×2 IMPLANT
SYR CONTROL 10ML LL (SYRINGE) ×2 IMPLANT
UNDERPAD 30X36 HEAVY ABSORB (UNDERPADS AND DIAPERS) ×2 IMPLANT

## 2021-09-29 NOTE — Anesthesia Preprocedure Evaluation (Addendum)
Anesthesia Evaluation  ?Patient identified by MRN, date of birth, ID band ?Patient awake ? ? ? ?Reviewed: ?Allergy & Precautions, NPO status , [name] Chart, lab work & pertinent test results ? ?History of Anesthesia Complications ?(+) PROLONGED EMERGENCE, Family history of anesthesia reaction and history of anesthetic complications (mother had a hard time waking up after knee surgery in 2021) ? ?Airway ?Mallampati: II ? ?TM Distance: >3 FB ?Neck ROM: Full ? ? ?Comment: Neck pain, ACDF Dental ? ?(+) Edentulous Upper, Missing ?  ?Pulmonary ?shortness of breath and with exertion, asthma , sleep apnea , pneumonia, neg COPD,  COPD inhaler, former smoker,  ?  ?Pulmonary exam normal ?breath sounds clear to auscultation ? ? ? ? ? ? Cardiovascular ?Exercise Tolerance: Good ?hypertension, Pt. on medications ?Normal cardiovascular exam ?Rhythm:Regular Rate:Normal ? ? ?  ?Neuro/Psych ? Headaches, PSYCHIATRIC DISORDERS Anxiety Depression Bipolar Disorder  Neuromuscular disease (Cervical myelopathy, bilateral upper extremity numbness and weakness)   ? GI/Hepatic ?Neg liver ROS, GERD (gastroparesis)  Medicated and Controlled,  ?Endo/Other  ?diabetes, Well Controlled, Type 2, Oral Hypoglycemic Agents, Insulin DependentHypothyroidism Morbid obesity ? Renal/GU ?negative Renal ROS  ?negative genitourinary ?  ?Musculoskeletal ? ?(+) Arthritis , Osteoarthritis,   ? Abdominal ?(+) + obese,   ?Peds ?negative pediatric ROS ?(+)  Hematology ?negative hematology ROS ?(+)   ?Anesthesia Other Findings ?Back pain ? Reproductive/Obstetrics ?negative OB ROS ? ?  ? ? ? ? ? ? ? ? ? ? ? ? ? ?  ?  ? ? ? ? ? ? ? ? ?Anesthesia Physical ?Anesthesia Plan ? ?ASA: 3 ? ?Anesthesia Plan: General  ? ?Post-op Pain Management: Minimal or no pain anticipated  ? ?Induction: Intravenous ? ?PONV Risk Score and Plan: 4 or greater and Ondansetron and Metaclopromide ? ?Airway Management Planned: Oral ETT ? ?Additional Equipment:   ? ?Intra-op Plan:  ? ?Post-operative Plan: Extubation in OR ? ?Informed Consent: I have reviewed the patients History and Physical, chart, labs and discussed the procedure including the risks, benefits and alternatives for the proposed anesthesia with the patient or authorized representative who has indicated his/her understanding and acceptance.  ? ? ? ?Dental advisory given ? ?Plan Discussed with: CRNA and Surgeon ? ?Anesthesia Plan Comments: (Cervical myelopathy, bilateral upper extremity numbness and weakness, will ask position her self and make sure she is comfortable with positioning before she goes to sleep. )  ? ? ? ? ? ?Anesthesia Quick Evaluation ? ?

## 2021-09-29 NOTE — Op Note (Signed)
Orthopaedic Surgery Operative Note (CSN: [id number]) ? ?Judith [name]  1971/04/13 ?Date of Surgery: 09/29/2021 ? ? ?Diagnoses:  ?Right ring, index and thumb trigger finger ? ?Procedure: ?Trigger finger release Right Thumb, ring and index finger (26055 x 3) ?  ?Operative Finding ?Successful completion of the planned procedure.  Complete release of the A1 pulley to the right thumb, right ring and index fingers. ? ? ?Post-Op Diagnosis: Same ?Surgeons:Primary: Mordecai Rasmussen, MD ?Assistants: None ?Location: [facility] OR ROOM 4 ?Anesthesia: General with local anesthesia ?Antibiotics: Ancef 3 g ?Tourniquet time:  ?Total Tourniquet Time Documented: ?Upper Arm (Right) - 41 minutes ?Total: Upper Arm (Right) - 41 minutes ? ?Estimated Blood Loss: Minimal ?Complications: None ?Specimens: None ?Implants: ?None ? ?Indications for Surgery:   ?Judith [name] is a 51 y.o. female with a Right Thumb, index and ring finger trigger finger.  The patient is complaining of pain over the A1 pulley with progressively worsening triggering of the thumb and ring fingers.  She has exquisite tenderness to palpation over the A1 pulley to the index finger, which is similar to previous symptoms to her other fingers.  She has previously had injections, which improved the symptoms, but did not resolve the current symptoms.  Benefits and risks of operative and nonoperative management were discussed prior to surgery with patient and informed consent form was completed.  Specific risks including infection, need for additional surgery, damage to surrounding structures, recurrence and more severe complications associated with anesthesia were discussed.  She has elected to proceed with surgery.  Surgical consent was finalized. ? ? ?Procedure:   ?The patient was identified properly. Informed consent was obtained and the surgical site was marked. The patient was taken to the OR suite where the above stated anesthesia was induced.  The patient was positioned supine on a  hand table.  The right arm was prepped and draped in the usual sterile fashion.  Timeout was performed before the beginning of the case.  Tourniquet was used for the above duration.  The patient received antibiotics prior to the incision.  ? ?We made an incision directly overlying the A1 pulley to the Ring finger.  We carefully incised sharply through the skin only.  We continued with blunt dissection to the level of the A1 pulley.  We identified the adjacent neurovascular bundles throughout the case.  The leading edge of the A1 pulley was identified and incised sharply with a knife.  We then used scissors to completely release the pulley distally, and then proximally.  The A1 pulley was noted to be very thick with some irritation of the underlying tendon.  We then used a tendon hook to remove the tendon from its usual position to ensure that the A1 pulley was released and the tendon moving freely.  We passively flexed and extended the Ring finger and noted there were no additional adhesions or catching. ? ?We then turned our attention to the thumb.  We made an oblique incision through the dominant skin crease overlying the A1 pulley.  We dissected through skin.  Neurovascular bundles were protected.  The A1 pulley was completely released.  The tendons were removed from the incision with a tendon hook, and noted to be completely free.  We evaluated the tendon through passive range of motion, and noted no additional adhesions.  Finally, we turned our attention to the index finger.  Made an incision directly overlying the A1 pulley.  The neurovascular bundle, especially on the radial side of the incision  was protected.  The leading edge of the A1 pulley was identified.  This was incised sharply with a knife.  We then proceeded to release the remaining portions of the A1 pulley completely.  There were no additional adhesions.  Once again, the tendon was removed from the incision with a tendon hook, and noted to be free  of the A1 pulley.  No additional adhesions were noted.  Passive flexion extension of the tendon demonstrated some irritation, but no additional adhesions. ? ?We irrigated the wound copiously.  We closed the incision with multiple interrupted sutures.  Marcaine 0.5% was then injected at the incision site.  Sterile dressing was placed.  Patient was awoken taken to PACU in stable condition. ? ? ?Post-operative plan:  ?The patient will be discharged home. ?They can removed the bulky dressing in 5 days.   ?DVT prophylaxis not indicated in this ambulatory upper extremity patient without significant risk factors.    ?Patient will be provided with a limited prescription of narcotics for severe pain.  OTC medications can be used as needed.  ?Follow up plan will be scheduled in approximately 7-10 days for suture removal and incision check. ? ?

## 2021-09-29 NOTE — Discharge Instructions (Signed)
?  Ryonna Cimini A. Amedeo Kinsman, MD MS ?[facility] ?[address] ?[place]  [address] ?Phone: [phone] ?Fax: [phone] ? ? ? ?POST-OPERATIVE INSTRUCTIONS ? ?I would like to see you in clinic in [place] next week, either 5/23 or 5/24.  Please call the office within the next 1-2 days to schedule this appointment.  ? ? ?WOUND CARE ?You may remove your bandage on postop day 5 and get the hand wet.  No ointments or lotions to be applied to the incision.  Do not submerge the incision for 1 month.  After you remove the surgical dressing, please keep the sutures covered with a band-aid, or gauze and tape.  ? ?FOLLOW-UP ?If you develop a Fever (>101.5), Redness or Drainage from the surgical incision site, please call our office to arrange for an evaluation. ?Please call the office to schedule a follow-up appointment for your incision check if you do not already have one, 7-10 days post-operatively. ? ?IF YOU HAVE ANY QUESTIONS, PLEASE FEEL FREE TO CALL OUR OFFICE. ? ?HELPFUL INFORMATION ? ?You should wean off your narcotic medicines as soon as you are able.  Most patients will be off or using minimal narcotics before their first postop appointment.  ? ?You may be more comfortable sleeping in a semi-seated position the first few nights following surgery.  Keep a pillow propped under the elbow and forearm for comfort.  If you have a recliner type of chair it might be beneficial.   ? ?We suggest you use the pain medication the first night prior to going to bed, in order to ease any pain when the anesthesia wears off. You should avoid taking pain medications on an empty stomach as it will make you nauseous. ? ?Do not drink alcoholic beverages or take illicit drugs when taking pain medications. ? ?You may return to work/school in the next couple of days when you feel up to it. Desk work and typing is fine. ? ?Pain medication may make you constipated.  Below are a few solutions to try in this  order: ?Decrease the amount of pain medication if you aren?t having pain. ?Drink lots of decaffeinated fluids. ?Drink prune juice and/or each dried prunes ? ?If the first 3 don?t work start with additional solutions ?Take Colace - an over-the-counter stool softener ?Take Senokot - an over-the-counter laxative ?Take Miralax - a stronger over-the-counter laxative ? ? ? ?

## 2021-09-29 NOTE — Anesthesia Postprocedure Evaluation (Signed)
Anesthesia Post Note ? ?Patient: TYQUISHA SHARPS ? ?Procedure(s) Performed: RELEASE TRIGGER FINGER/A-1 PULLEY (Right: Hand) ? ?Patient location during evaluation: Phase II ?Anesthesia Type: General ?Level of consciousness: awake and alert and oriented ?Pain management: pain level controlled ?Vital Signs Assessment: post-procedure vital signs reviewed and stable ?Respiratory status: spontaneous breathing, nonlabored ventilation and respiratory function stable ?Cardiovascular status: blood pressure returned to baseline and stable ?Postop Assessment: no apparent nausea or vomiting ?Anesthetic complications: no ? ? ?No notable events documented. ? ? ?Last Vitals:  ?Vitals:  ? 09/29/21 0925 09/29/21 0932  ?BP:  (!) 160/83  ?Pulse: 95 92  ?Resp: 18 17  ?Temp:  36.8 ?C  ?SpO2: 93% 98%  ?  ?Last Pain:  ?Vitals:  ? 09/29/21 0932  ?TempSrc: Oral  ?PainSc: 8   ? ? ?  ?  ?  ?  ?  ?  ? ?Ebin Palazzi C Terez Freimark ? ? ? ? ?

## 2021-09-29 NOTE — Interval H&P Note (Signed)
History and Physical Interval Note: ? ?09/29/2021 ?7:12 AM ? ?Judith [name]  has presented today for surgery, with the diagnosis of Right ring, index and thumb trigger finger.  The various methods of treatment have been discussed with the patient and family. After consideration of risks, benefits and other options for treatment, the patient has consented to  Procedure(s) with comments: ?RELEASE TRIGGER FINGER/A-1 PULLEY (Right) - Right ring, index and thumb trigger finger as a surgical intervention.  The patient's history has been reviewed, patient examined, no change in status, stable for surgery.  I have reviewed the patient's chart and labs.  Questions were answered to the patient's satisfaction.   ? ?Patient has triggering of her index finger and thumb.  She has pain and discomfort at her right index finger A1 pulley.  Currently, it feels the same way her ring finger and thumb felt, prior to triggering.  She would like to proceed with release of all 3 fingers.   ? ? ? ?Mordecai Rasmussen ? ? ?

## 2021-09-29 NOTE — Anesthesia Procedure Notes (Signed)
Procedure Name: Intubation ?Date/Time: 09/29/2021 7:39 AM ?Performed by: Orlie Dakin, CRNA ?Pre-anesthesia Checklist: Patient identified, Emergency Drugs available, Suction available and Patient being monitored ?Patient Re-evaluated:Patient Re-evaluated prior to induction ?Oxygen Delivery Method: Circle system utilized ?Preoxygenation: Pre-oxygenation with 100% oxygen ?Induction Type: IV induction, Rapid sequence and Cricoid Pressure applied ?Laryngoscope Size: Glidescope and 3 ?Grade View: Grade II ?Tube type: Oral ?Tube size: 7.0 mm ?Number of attempts: 1 ?Airway Equipment and Method: Rigid stylet and Video-laryngoscopy ?Placement Confirmation: positive ETCO2, breath sounds checked- equal and bilateral and ETT inserted through vocal cords under direct vision ?Secured at: 21 cm ?Tube secured with: Tape ?Dental Injury: Teeth and Oropharynx as per pre-operative assessment  ?Comments: Glidescope used due to gastroparesis, H/O neck pain, hand numbness,  Short thick neck, large tongue noted, H/O OSA. ? ? ? ? ?

## 2021-09-29 NOTE — Progress Notes (Deleted)
[facility] New Patient Evaluation and Consultation  Referring Provider: Janora Norlander, DO PCP: Janora Norlander, DO Date of Service: 09/30/2021  SUBJECTIVE Chief Complaint: No chief complaint on file.  History of Present Illness: Judith [name] is a 51 y.o. White or Caucasian female presenting for evaluation of urinary incontinence.     Urinary Symptoms: {urine leakage?:24754} Leaks *** time(s) per {days/wks/mos/yrs:310907}.  Pad use: {NUMBERS 1-10:18281} {pad option:24752} per day.   She {ACTION; IS/IS WCH:85277824} bothered by her UI symptoms.  Day time voids ***.  Nocturia: *** times per night to void. Voiding dysfunction: she {empties:24755} her bladder well.  {DOES NOT does:27190::"does not"} use a catheter to empty bladder.  When urinating, she feels {urine symptoms:24756} Drinks: *** per day  UTIs: {NUMBERS 1-10:18281} UTI's in the last year.   {ACTIONS;DENIES/REPORTS:21021675::"Denies"} history of {urologic concerns:24757}  Pelvic Organ Prolapse Symptoms:                  She {denies/ admits to:24761} a feeling of a bulge the vaginal area. It has been present for {NUMBER 1-10:22536} {days/wks/mos/yrs:310907}.  She {denies/ admits to:24761} seeing a bulge.  This bulge {ACTION; IS/IS MPN:36144315} bothersome.  Bowel Symptom: Bowel movements: *** time(s) per {Time; day/week/month:13537} Stool consistency: {stool consistency:24758} Straining: {yes/no:19897}.  Splinting: {yes/no:19897}.  Incomplete evacuation: {yes/no:19897}.  She {denies/ admits to:24761} accidental bowel leakage / fecal incontinence  Occurs: *** time(s) per {Time; day/week/month:13537}  Consistency with leakage: {stool consistency:24758} Bowel regimen: {bowel regimen:24759} Last colonoscopy: Date ***, Results ***  Sexual Function Sexually active: {yes/no:19897}.  Sexual orientation: {Sexual Orientation:[phone]} Pain with sex: {pain with sex:24762}  Pelvic Pain {denies/  admits to:24761} pelvic pain Location: *** Pain occurs: *** Prior pain treatment: *** Improved by: *** Worsened by: ***   Past Medical History:  Past Medical History:  Diagnosis Date   Allergy    Anxiety    Arthritis    Asthma    Bipolar 1 disorder ([place])    Blood transfusion without reported diagnosis    with child birth 58 yrs ago    Bronchitis, chronic ([place])    Constipation    COVID-19 virus infection 06/21/2020   DDD (degenerative disc disease), cervical    Depression    Diabetes mellitus without complication ([place])    Dysphagia    Family history of adverse reaction to anesthesia    mother had a hard time waking up after knee surgery in 2021   Gastroparesis    GERD (gastroesophageal reflux disease)    History of kidney stones    History of MRSA infection    Hyperlipemia    Hypothyroid    Migraines    Pneumonia    Sleep apnea    no cpap      Past Surgical History:   Past Surgical History:  Procedure Laterality Date   ABDOMINAL SURGERY     ANTERIOR CERVICAL DECOMP/DISCECTOMY FUSION N/A 10/09/2020   Procedure: Anterior Cervical Discectomy Fusion Cervical five-six;  Surgeon: Vallarie Mare, MD;  Location: [place];  Service: Neurosurgery;  Laterality: N/A;   BIOPSY  02/04/2021   Procedure: BIOPSY;  Surgeon: Yetta Flock, MD;  Location: [facility];  Service: Gastroenterology;;   BOTOX INJECTION N/A 02/04/2021   Procedure: BOTOX INJECTION;  Surgeon: Yetta Flock, MD;  Location: [facility];  Service: Gastroenterology;  Laterality: N/A;   CARPAL TUNNEL RELEASE Bilateral 1999   CESAREAN SECTION     CHOLECYSTECTOMY     COLONOSCOPY     ESOPHAGEAL MANOMETRY N/A  07/08/2020   Procedure: ESOPHAGEAL MANOMETRY (EM);  Surgeon: Yetta Flock, MD;  Location: [facility];  Service: Gastroenterology;  Laterality: N/A;   ESOPHAGOGASTRODUODENOSCOPY (EGD) WITH PROPOFOL N/A 02/04/2021   Procedure: ESOPHAGOGASTRODUODENOSCOPY (EGD) WITH PROPOFOL;  Surgeon:  Yetta Flock, MD;  Location: [facility];  Service: Gastroenterology;  Laterality: N/A;   REPLACEMENT TOTAL KNEE Bilateral    SCLEROTHERAPY  02/04/2021   Procedure: SCLEROTHERAPY;  Surgeon: Yetta Flock, MD;  Location: [facility];  Service: Gastroenterology;;   THYROIDECTOMY     TONSILLECTOMY     TONSILLECTOMY AND ADENOIDECTOMY     TRIGGER FINGER RELEASE Left 06/16/2021   Procedure: LEFT THUMB TRIGGER FINGER RELEASE;  Surgeon: Mordecai Rasmussen, MD;  Location: [facility];  Service: Orthopedics;  Laterality: Left;   UPPER GASTROINTESTINAL ENDOSCOPY       Past OB/GYN History: G{NUMBERS 1-10:18281} P{NUMBERS 1-10:18281} Vaginal deliveries: ***,  Forceps/ Vacuum deliveries: ***, Cesarean section: *** Menopausal: {menopausal:24763} Contraception: ***. Last pap smear was ***.  Any history of abnormal pap smears: {yes/no:19897}.   Medications: She has a current medication list which includes the following prescription(s): accu-chek fastclix lancets, albuterol, albuterol, baclofen, accu-chek guide me, budesonide-formoterol, cetirizine, freestyle libre 3 sensor, cyanocobalamin, cyclosporine, dexlansoprazole, diclofenac sodium, dicyclomine, docusate sodium, gabapentin, accu-chek guide, guaifenesin, hydrochlorothiazide, hydrocodone-acetaminophen, ibuprofen, tresiba flextouch, b-d uf iii mini pen needles, lamotrigine, levothyroxine, linaclotide, lisdexamfetamine, lisdexamfetamine, lisdexamfetamine, medroxyprogesterone, nucala, montelukast, nystatin cream, ofloxacin, ondansetron, pioglitazone, promethazine, rosuvastatin, rybelsus, sertraline, and sodium chloride.   Allergies: Patient is allergic to diphenhydramine, morphine, prednisone, adhesive [tape], erythromycin, metformin and related, and sulfonamide derivatives.   Social History:  Social History   Tobacco Use   Smoking status: Former    Packs/day: 1.00    Years: 5.00    Pack years: 5.00    Types: Cigarettes    Quit date:  04/04/1993    Years since quitting: 28.5   Smokeless tobacco: Never   Tobacco comments:    smoked when went out with friends/social smoker. pack would last a week.  Vaping Use   Vaping Use: Former  Substance Use Topics   Alcohol use: No   Drug use: No    Relationship status: {relationship status:24764} She lives with ***.   She {ACTION; IS/IS JYN:82956213} employed ***. Regular exercise: {Yes/No:304960894} History of abuse: {Yes/No:304960894}  Family History:   Family History  Problem Relation Age of Onset   Mental illness Other    Diabetes Other    Healthy Mother    Cancer Father    Non-Hodgkin's lymphoma Father    Breast cancer Paternal Grandmother    Lung cancer Paternal Grandmother    Esophageal cancer Other    Colon cancer Neg Hx    Stomach cancer Neg Hx    Pancreatic cancer Neg Hx    Colon polyps Neg Hx      Review of Systems: ROS   OBJECTIVE Physical Exam: There were no vitals filed for this visit.  Physical Exam   GU / Detailed Urogynecologic Evaluation:  Pelvic Exam: Normal external female genitalia; Bartholin's and Skene's glands normal in appearance; urethral meatus normal in appearance, no urethral masses or discharge.   CST: {gen negative/positive:315881}  Reflexes: bulbocavernosis {DESC; PRESENT/NOT PRESENT:21021351}, anocutaneous {DESC; PRESENT/NOT PRESENT:21021351} ***bilaterally.  Speculum exam reveals normal vaginal mucosa {With/Without:20273} atrophy. Cervix {exam; gyn cervix:30847}. Uterus {exam; pelvic uterus:30849}. Adnexa {exam; adnexa:12223}.    s/p hysterectomy: Speculum exam reveals normal vaginal mucosa {With/Without:20273}  atrophy and normal vaginal cuff.  Adnexa {exam; adnexa:12223}.    With apex supported,  anterior compartment defect was {reduced:24765}  Pelvic floor strength {Roman # I-V:19040}/V, puborectalis {Roman # I-V:19040}/V external anal sphincter {Roman # I-V:19040}/V  Pelvic floor musculature: Right levator  {Tender/Non-tender:20250}, Right obturator {Tender/Non-tender:20250}, Left levator {Tender/Non-tender:20250}, Left obturator {Tender/Non-tender:20250}  POP-Q:   POP-Q                                               Aa                                               Ba                                                 C                                                Gh                                               Pb                                               tvl                                                Ap                                               Bp                                                 D     Rectal Exam:  Normal sphincter tone, {rectocele:24766} distal rectocele, enterocoele {DESC; PRESENT/NOT PRESENT:21021351}, no rectal masses, {sign of:24767} dyssynergia when asking the patient to bear down.  Post-Void Residual (PVR) by Bladder Scan: In order to evaluate bladder emptying, we discussed obtaining a postvoid residual and she agreed to this procedure.  Procedure: The ultrasound unit was placed on the patient's abdomen in the suprapubic region after the patient had voided. A PVR of *** ml was obtained by bladder scan.  Laboratory Results: '@ENCLABS'$ @   ***I visualized the urine specimen, noting the specimen to be {urine color:24768}  ASSESSMENT AND PLAN Ms. Koehler is a 51 y.o. with: No diagnosis found.    Jaquita Folds, MD   Medical  Decision Making:  - Reviewed/ ordered a clinical laboratory test - Reviewed/ ordered a radiologic study - Reviewed/ ordered medicine test - Decision to obtain old records - Discussion of management of or test interpretation with an external physician / other healthcare professional  - Assessment requiring independent historian - Review and summation of prior records - Independent review of image, tracing or specimen

## 2021-09-29 NOTE — Transfer of Care (Signed)
Immediate Anesthesia Transfer of Care Note ? ?Patient: Judith [name] ? ?Procedure(s) Performed: RELEASE TRIGGER FINGER/A-1 PULLEY (Right: Hand) ? ?Patient Location: PACU ? ?Anesthesia Type:General ? ?Level of Consciousness: awake ? ?Airway & Oxygen Therapy: Patient Spontanous Breathing and Patient connected to face mask oxygen ? ?Post-op Assessment: Report given to RN and Post -op Vital signs reviewed and stable ? ?Post vital signs: Reviewed and stable ? ?Last Vitals:  ?Vitals Value Taken Time  ?BP    ?Temp    ?Pulse 105 09/29/21 0847  ?Resp 23 09/29/21 0847  ?SpO2 98 % 09/29/21 0847  ?Vitals shown include unvalidated device data. ? ?Last Pain:  ?Vitals:  ? 09/29/21 0708  ?TempSrc: Oral  ?PainSc: 8   ?   ? ?  ? ?Complications: No notable events documented. ?

## 2021-09-30 ENCOUNTER — Encounter (HOSPITAL_COMMUNITY): Payer: Self-pay | Admitting: Orthopedic Surgery

## 2021-09-30 ENCOUNTER — Ambulatory Visit: Payer: Medicaid Other | Admitting: Obstetrics and Gynecology

## 2021-10-02 ENCOUNTER — Telehealth: Payer: Self-pay | Admitting: Family Medicine

## 2021-10-02 NOTE — Telephone Encounter (Deleted)
Pt called in wanting pain meds because she had surgery on her hand. She said dr Darnell Level has to write it because she signed contract. Nurse sent this message:  (on 5/12 at her appointment she signed a controlled substance contract and left a urine so she could get an update. dr g sent in 15 tablets which is 5 more than she normally sends in. she was aware of the surgery so thats why Lema got an extra 5 tablets. dr g reached out to her orthopedist and he was aware and thanked dr g for sending [place] pain meds for her. )  I called pt and read message and she understood.

## 2021-10-02 NOTE — Telephone Encounter (Signed)
Pt called in wanting pain meds because she had surgery on her hand. She said dr Darnell Level has to write it because she signed contract. Nurse sent this message:  (on 5/12 at her appointment she signed a controlled substance contract and left a urine so she could get an update. dr g sent in 15 tablets which is 5 more than she normally sends in. she was aware of the surgery so thats why Sanda got an extra 5 tablets. dr g reached out to her orthopedist and he was aware and thanked dr g for sending Naiah pain meds for her. )  I called pt and read message and she understood.

## 2021-10-03 ENCOUNTER — Ambulatory Visit: Payer: Medicaid Other

## 2021-10-03 LAB — TOXASSURE SELECT 13 (MW), URINE

## 2021-10-07 ENCOUNTER — Other Ambulatory Visit (HOSPITAL_COMMUNITY): Payer: Self-pay

## 2021-10-07 ENCOUNTER — Telehealth: Payer: Self-pay | Admitting: Pulmonary Disease

## 2021-10-07 DIAGNOSIS — J4540 Moderate persistent asthma, uncomplicated: Secondary | ICD-10-CM

## 2021-10-07 MED ORDER — NUCALA 100 MG/ML ~~LOC~~ SOAJ
100.0000 mg | SUBCUTANEOUS | 0 refills | Status: DC
  Filled 2021-10-09: qty 3, 84d supply, fill #0

## 2021-10-07 NOTE — Telephone Encounter (Signed)
Refill sent for [facility] to [facility]: [phone]   Dose: 100 mg SQ every 4 weeks  Last OV: video visit on 04/22/21 Provider: Dr. Silas Flood  Next OV: not scheduled but supposed to be in June (routing to scheduling team).  Knox Saliva, PharmD, MPH, BCPS Clinical Pharmacist (Rheumatology and Pulmonology)

## 2021-10-08 ENCOUNTER — Ambulatory Visit (INDEPENDENT_AMBULATORY_CARE_PROVIDER_SITE_OTHER): Payer: Medicaid Other | Admitting: Orthopedic Surgery

## 2021-10-08 ENCOUNTER — Encounter: Payer: Self-pay | Admitting: Orthopedic Surgery

## 2021-10-08 DIAGNOSIS — M65321 Trigger finger, right index finger: Secondary | ICD-10-CM

## 2021-10-08 DIAGNOSIS — M65311 Trigger thumb, right thumb: Secondary | ICD-10-CM

## 2021-10-08 DIAGNOSIS — M65341 Trigger finger, right ring finger: Secondary | ICD-10-CM

## 2021-10-08 NOTE — Telephone Encounter (Signed)
Dr. Silas Flood, are you ok with a video visit?

## 2021-10-08 NOTE — Patient Instructions (Signed)
Keep hand clean.  Dressing as needed  Focus on range of motion.  Avoid power gripping of right hand for the next 2-3 weeks  Follow up in 1 month for repeat evaluation

## 2021-10-08 NOTE — Progress Notes (Signed)
Orthopaedic Postop Note  Assessment: Judith [name] is a 51 y.o. female s/p right thumb, index and ring finger trigger release  DOS: 09/29/2021  Plan: Incisions are healing appropriately.   Sutures removed, Dermabond placed. Keep hands clean. Cover incisions as needed. Okay to work on gentle range of motion.  Avoid power grip. Follow-up in 4 weeks.   Follow-up: Return in about 4 weeks (around 11/05/2021). XR at next visit: None  Subjective:  Chief Complaint  Patient presents with   Routine Post Op    RT hand Trigger finger release DOS 09/29/21    History of Present Illness: Judith [name] is a 51 y.o. female who presents following the above stated procedure.  Surgery was 10 days ago.  She has done well.  She has some tenderness to palpation at the ring finger incision.  No drainage.  Small scab in this area.  She still has some pain in the fingers, but no triggering.  Review of Systems: No fevers or chills No numbness or tingling No Chest Pain No shortness of breath   Objective: There were no vitals taken for this visit.  Physical Exam:   Thumb incisions healing very well.  No drainage or redness.  Index finger incision looks good as well with a small amount of swelling.  Ring finger incision with mild scabbing and general tenderness.  Some serous drainage.  Almost able to make a full fist, but notes some residual pain.  Sensation is intact throughout the right hand.   IMAGING: I personally ordered and reviewed the following images:  No new imaging obtained today.  Mordecai Rasmussen, MD 10/08/2021 11:12 PM

## 2021-10-09 ENCOUNTER — Other Ambulatory Visit (HOSPITAL_COMMUNITY): Payer: Self-pay

## 2021-10-14 NOTE — Telephone Encounter (Signed)
Called patient and got her scheduled for mychart video visit next month on the 15th.Patietn verbalized understanding. Nothing further needed

## 2021-10-14 NOTE — Telephone Encounter (Signed)
Video visit is fine thanks

## 2021-10-15 ENCOUNTER — Ambulatory Visit (INDEPENDENT_AMBULATORY_CARE_PROVIDER_SITE_OTHER): Payer: Medicaid Other | Admitting: *Deleted

## 2021-10-15 ENCOUNTER — Other Ambulatory Visit (HOSPITAL_COMMUNITY): Payer: Self-pay

## 2021-10-15 DIAGNOSIS — Z3042 Encounter for surveillance of injectable contraceptive: Secondary | ICD-10-CM

## 2021-10-15 MED ORDER — MEDROXYPROGESTERONE ACETATE 150 MG/ML IM SUSP
150.0000 mg | Freq: Once | INTRAMUSCULAR | Status: AC
  Administered 2021-10-15: 150 mg via INTRAMUSCULAR

## 2021-10-15 NOTE — Progress Notes (Signed)
Depo shot left hip. Pt tolerated well.

## 2021-10-30 ENCOUNTER — Encounter: Payer: Self-pay | Admitting: Pulmonary Disease

## 2021-10-30 ENCOUNTER — Telehealth (INDEPENDENT_AMBULATORY_CARE_PROVIDER_SITE_OTHER): Payer: Medicaid Other | Admitting: Pulmonary Disease

## 2021-10-30 DIAGNOSIS — J4521 Mild intermittent asthma with (acute) exacerbation: Secondary | ICD-10-CM

## 2021-10-30 NOTE — Progress Notes (Signed)
Virtual Visit via Video Note  I connected with Judith [name] on 10/30/21 at  1:45 PM EDT by a video enabled telemedicine application and verified that I am speaking with the correct person using two identifiers.  Location: Patient: Home Provider: Office - [facility] - [address], [address], [place], [place] [address]  I discussed the limitations of evaluation and management by telemedicine and the availability of in person appointments. The patient expressed understanding and agreed to proceed. I also discussed with the patient that there may be a patient responsible charge related to this service. The patient expressed understanding and agreed to proceed.  Patient consented to consult via telephone: Yes People present and their role in pt care: Pt   History of Present Illness:  51 y.o. woman whom we are seeing in follow-up of dyspnea on exertion, chronic bronchitis felt to be related to poorly controlled asthma.  Multiple pharmacy notes reviewed.  Patient with longstanding history of cough, shortness of breath.  Persistent symptoms despite aggressive inhaler therapy.  Was started on Nucala.  Eosinophil count elevated.  Since starting Nucala, symptoms are essentially gone.  Dyspnea greatly improved.  Cough gone.  No flares.  No hospitalizations. One bout of bronchitis in interim with depot methylprednisolone at PCP office per report.  Overall doing quite well.  No cough, minimal DOE. No hospitalizations.   Chief complaint:    Dyspnea on exertion Observations/Objective:  Physical exam General: Well-appearing, in no acute distress Eyes: EOMI, no icterus Pulmonary: Normal work of breathing, speaks in full sentences Neuro: No focal weakness Psych: Normal mood, full affect  Social History   Tobacco Use  Smoking Status Former   Packs/day: 1.00   Years: 5.00   Total pack years: 5.00   Types: Cigarettes   Quit date: 04/04/1993   Years since quitting: 28.5  Smokeless Tobacco  Never  Tobacco Comments   smoked when went out with friends/social smoker. pack would last a week.   Immunization History  Administered Date(s) Administered   Influenza, Seasonal, Injecte, Preservative Fre 02/09/2018   Influenza,inj,Quad PF,6+ Mos 02/14/2017, 02/20/2019   Influenza-Unspecified 02/05/2020, 01/21/2021   MMR [birth year]/1971   Moderna Sars-Covid-2 Vaccination 10/02/2019, 11/01/2019, 04/18/2020, 11/26/2020   PNEUMOCOCCAL CONJUGATE-20 11/26/2020   Pneumococcal Polysaccharide-23 02/20/2019, 11/01/2019   Td 03/03/1985, 06/30/1994   Tdap 12/24/2014, 11/26/2020   Zoster Recombinat (Shingrix) 02/05/2020    Assessment and Plan:  Asthma: Previously persistent severe with multiple exacerbations a year.  Started Nucala 10/2020. One exacerbation since. Had 6-7 exacerbations in year preceding starting Nucala. Symptoms of cough and dyspnea improved.  To continue q. monthly Nucala shots.  To continue ICS/LABA therapy with Symbicort.  She reports no rescue inhaler use.  Follow Up Instructions:  Return in about 6 months (around 05/01/2022).    I discussed the assessment and treatment plan with the patient. The patient was provided an opportunity to ask questions and all were answered. The patient agreed with the plan and demonstrated an understanding of the instructions.   The patient was advised to call back or seek an in-person evaluation if the symptoms worsen or if the condition fails to improve as anticipated.  I provided 32 minutes of non-face-to-face time during this encounter.   Lanier Clam, MD

## 2021-11-05 ENCOUNTER — Ambulatory Visit (INDEPENDENT_AMBULATORY_CARE_PROVIDER_SITE_OTHER): Payer: Medicaid Other

## 2021-11-05 ENCOUNTER — Encounter: Payer: Self-pay | Admitting: Orthopedic Surgery

## 2021-11-05 ENCOUNTER — Ambulatory Visit (INDEPENDENT_AMBULATORY_CARE_PROVIDER_SITE_OTHER): Payer: Medicaid Other | Admitting: Orthopedic Surgery

## 2021-11-05 VITALS — Ht 68.0 in | Wt 307.0 lb

## 2021-11-05 DIAGNOSIS — M1812 Unilateral primary osteoarthritis of first carpometacarpal joint, left hand: Secondary | ICD-10-CM

## 2021-11-05 DIAGNOSIS — M79642 Pain in left hand: Secondary | ICD-10-CM | POA: Diagnosis not present

## 2021-11-05 NOTE — Progress Notes (Signed)
Orthopaedic Postop Note  Assessment: Judith [name] is a 51 y.o. 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  DOS: 09/29/2021  Plan: Right hand continues to improve.  She has some swelling and tenderness at the incision site to the right ring finger.  This is restricting motion.  Recommended she continue to work on range of motion, and improving the swelling in this area.  Both will improve her motion overall.  In regards to the ongoing left thumb pain, which is at the base of her thumb.  Pain with CMC grind testing.  Radiographs demonstrates moderate degenerative changes within the [facility] joint.  We discussed multiple treatment options, and she would like to proceed with the use of a thumb spica brace at this point.  If she continues to have issues, we can consider hand therapy for hand-based thumb brace.  Injection is also possibility.  We will see her back in clinic in approximately 6-8 weeks.   Follow-up: Return in about 6 weeks (around 12/17/2021). XR at next visit: None  Subjective:  Chief Complaint  Patient presents with   Routine Post Op    Rt hand DOS 09/29/21     History of Present Illness: Judith [name] is a 51 y.o. female who presents following the above stated procedure.  Surgery was 6 weeks ago.  She continues to get better.  No triggering of the thumb, index or ring finger.  She does continue to have some tenderness at the incision site of the ring finger.  There is some swelling in this area.  She has slightly restricted range of motion of the right ring finger as a result.  No fevers or chills.  Regarding her left hand, we have been monitoring some ongoing symptoms she has been having.  She reports pain the base of the thumb, which radiates distally.  This has been ongoing since her first procedure.  Review of Systems: No fevers or  chills No numbness or tingling No Chest Pain No shortness of breath   Objective: Ht '5\' 8"'$  (1.727 m)   Wt (!) 307 lb (139.3 kg)   BMI 46.68 kg/m   Physical Exam:   Right hand surgical incisions are healing well.  No surrounding erythema or drainage to all 3 incisions.  No swelling at the incision site to the thumb and index finger.  Some mild swelling and redness to the incision site to the ring finger.  She also slightly restricted range of motion of the right ring finger, which is preventing her from making a full fist.  Left thumb with a well-healed surgical incision at the base of the thumb.  No surrounding erythema or drainage.  Pain with CMC grind testing.  Tenderness to palpation at the thumb [facility] joint.   IMAGING: I personally ordered and reviewed the following images:  X-rays of the left thumb were obtained in clinic today.  There is a eccentric loss of joint space.  There is some bone-on-bone articulation, with minimal osteophytes.  Impression: Left thumb CMC arthritis   Mordecai Rasmussen, MD 11/06/2021 12:35 PM

## 2021-11-06 ENCOUNTER — Encounter: Payer: Self-pay | Admitting: Orthopedic Surgery

## 2021-11-07 ENCOUNTER — Other Ambulatory Visit: Payer: Self-pay | Admitting: Family Medicine

## 2021-11-07 DIAGNOSIS — R110 Nausea: Secondary | ICD-10-CM

## 2021-11-10 MED ORDER — ONDANSETRON 4 MG PO TBDP: 4.0000 mg | ORAL_TABLET | Freq: Three times a day (TID) | ORAL | 0 refills | Status: DC | PRN

## 2021-11-13 ENCOUNTER — Ambulatory Visit: Payer: Medicaid Other

## 2021-11-21 DIAGNOSIS — F3181 Bipolar II disorder: Secondary | ICD-10-CM | POA: Diagnosis not present

## 2021-11-25 ENCOUNTER — Other Ambulatory Visit: Payer: Self-pay | Admitting: Family Medicine

## 2021-11-25 DIAGNOSIS — Z794 Long term (current) use of insulin: Secondary | ICD-10-CM

## 2021-11-26 ENCOUNTER — Other Ambulatory Visit: Payer: Self-pay | Admitting: Family Medicine

## 2021-11-27 ENCOUNTER — Telehealth: Payer: Self-pay | Admitting: Pharmacist

## 2021-11-27 ENCOUNTER — Ambulatory Visit (INDEPENDENT_AMBULATORY_CARE_PROVIDER_SITE_OTHER): Payer: Medicaid Other | Admitting: Pharmacist

## 2021-11-27 VITALS — Wt 319.0 lb

## 2021-11-27 DIAGNOSIS — E118 Type 2 diabetes mellitus with unspecified complications: Secondary | ICD-10-CM | POA: Diagnosis not present

## 2021-11-27 DIAGNOSIS — Z794 Long term (current) use of insulin: Secondary | ICD-10-CM | POA: Diagnosis not present

## 2021-11-27 MED ORDER — ACCU-CHEK GUIDE VI STRP: ORAL_STRIP | 3 refills | Status: DC

## 2021-11-27 MED ORDER — ACCU-CHEK FASTCLIX LANCETS MISC: 3 refills | Status: AC

## 2021-11-27 MED ORDER — TIRZEPATIDE 5 MG/0.5ML ~~LOC~~ SOAJ: 5.0000 mg | SUBCUTANEOUS | 0 refills | Status: DC

## 2021-11-27 NOTE — Progress Notes (Signed)
    11/27/2021 Name: Judith [name] MRN: [id number] DOB: 01/13/1971   S:  15 yoF Presents for diabetes evaluation, education, and management Patient was referred and last seen by Primary Care Provider on 09/26/21.   Insurance coverage/medication affordability: [place] medicaid   Patient reports adherence with medications. Current diabetes medications include: tresiba, actos, rybelsus '14mg'$  Current hypertension medications include: lisinopril Goal 130/80 Current hyperlipidemia medications include: rosuvastatin   Patient denies hypoglycemic events.   Discussed meal planning options and Plate method for healthy eating Avoid sugary drinks and desserts Incorporate balanced protein, non starchy veggies, 1 serving of carbohydrate with each meal Increase water intake Increase physical activity as able   Patient-reported exercise habits: n/a    O:  Lab Results  Component Value Date   HGBA1C 7.1 (H) 09/08/2021   WEIGHT 319LBS  Lipid Panel     Component Value Date/Time   CHOL 144 01/22/2021 1315   TRIG 120 01/22/2021 1315   HDL 30 (L) 01/22/2021 1315   CHOLHDL 4.8 (H) 01/22/2021 1315   LDLCALC 92 01/22/2021 1315     Home fasting blood sugars: <150  2 hour post-meal/random blood sugars: 250.    Clinical Atherosclerotic Cardiovascular Disease (ASCVD): No   The 10-year ASCVD risk score (Arnett DK, et al., 2019) is: 7.3%   Values used to calculate the score:     Age: 51 years     Sex: Female     Is Non-Hispanic African American: No     Diabetic: Yes     Tobacco smoker: No     Systolic Blood Pressure: 099 mmHg     Is BP treated: Yes     HDL Cholesterol: 30 mg/dL     Total Cholesterol: 144 mg/dL    A/P:  Diabetes T2DM currently UNCONTROLLED. She is currently wearing libre 3 CGM.  She is having post prandial highs up to 250 and hypoglycemia overnight.  Patient is also interested in additional weight loss.    Will add Mounjaro '5mg'$  weekly and discontinue Rybelsus.  She has  tried and failed trulicty and ozempic   Will titrate mounjaro at f/u on 01/07/22.   Counseled on diet/lifestyle Denies personal and family history of Medullary thyroid cancer (MTC)   Accuchek guide refills called in; continue libre 3 CGM Guide meter given Libre sample given   -Extensively discussed pathophysiology of diabetes, recommended lifestyle interventions, dietary effects on blood sugar control  -Counseled on s/sx of and management of hypoglycemia  -Next A1C anticipated 3 months.       Written patient instructions provided.  Total time in face to face counseling 30 minutes.   Follow up Pharmacist Clinic Visit ON 01/07/22.

## 2021-11-27 NOTE — Telephone Encounter (Signed)
Prior Auth for [employer] '5mg'$  Approved 11/27/21 through 11/27/22.  Called pt and left VM stating PA approved and to call pharmacy to have it filled and call us back with any further questions or concerns.

## 2021-12-26 ENCOUNTER — Other Ambulatory Visit: Payer: Self-pay | Admitting: Family Medicine

## 2021-12-26 DIAGNOSIS — M5136 Other intervertebral disc degeneration, lumbar region: Secondary | ICD-10-CM

## 2021-12-26 DIAGNOSIS — F319 Bipolar disorder, unspecified: Secondary | ICD-10-CM | POA: Diagnosis not present

## 2021-12-26 DIAGNOSIS — F419 Anxiety disorder, unspecified: Secondary | ICD-10-CM | POA: Diagnosis not present

## 2021-12-26 DIAGNOSIS — B372 Candidiasis of skin and nail: Secondary | ICD-10-CM

## 2021-12-26 DIAGNOSIS — M5030 Other cervical disc degeneration, unspecified cervical region: Secondary | ICD-10-CM

## 2021-12-26 DIAGNOSIS — E118 Type 2 diabetes mellitus with unspecified complications: Secondary | ICD-10-CM

## 2021-12-26 MED ORDER — NYSTATIN 100000 UNIT/GM EX CREA: 1.0000 | TOPICAL_CREAM | Freq: Two times a day (BID) | CUTANEOUS | 0 refills | Status: DC

## 2021-12-29 ENCOUNTER — Ambulatory Visit (INDEPENDENT_AMBULATORY_CARE_PROVIDER_SITE_OTHER): Payer: Medicaid Other | Admitting: Orthopedic Surgery

## 2021-12-29 ENCOUNTER — Other Ambulatory Visit: Payer: Self-pay | Admitting: Family Medicine

## 2021-12-29 ENCOUNTER — Encounter: Payer: Self-pay | Admitting: Orthopedic Surgery

## 2021-12-29 ENCOUNTER — Other Ambulatory Visit: Payer: Medicaid Other

## 2021-12-29 ENCOUNTER — Encounter: Payer: Self-pay | Admitting: Family Medicine

## 2021-12-29 DIAGNOSIS — E118 Type 2 diabetes mellitus with unspecified complications: Secondary | ICD-10-CM

## 2021-12-29 DIAGNOSIS — Z794 Long term (current) use of insulin: Secondary | ICD-10-CM | POA: Diagnosis not present

## 2021-12-29 DIAGNOSIS — M5136 Other intervertebral disc degeneration, lumbar region: Secondary | ICD-10-CM

## 2021-12-29 DIAGNOSIS — M79642 Pain in left hand: Secondary | ICD-10-CM

## 2021-12-29 DIAGNOSIS — E039 Hypothyroidism, unspecified: Secondary | ICD-10-CM

## 2021-12-29 LAB — BAYER DCA HB A1C WAIVED: HB A1C (BAYER DCA - WAIVED): 6.9 % — ABNORMAL HIGH (ref 4.8–5.6)

## 2021-12-29 MED ORDER — METHYLPREDNISOLONE ACETATE 40 MG/ML IJ SUSP
40.0000 mg | Freq: Once | INTRAMUSCULAR | Status: AC
  Administered 2021-12-29: 40 mg via INTRA_ARTICULAR

## 2021-12-29 NOTE — Progress Notes (Signed)
Orthopaedic Postop Note  Assessment: Kelly TANAJA GANGER is a 51 y.o. 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  DOS: 09/29/2021  Plan: Surgery on the right hand was approximately 3 months ago.  She has some continued discomfort at the ring finger incision site.  No concern for infection.  Minimal scar tissue has built up.  Provided reassurance.  Anticipate this will continue to improve with time.  In regards to her left thumb, she has tried multiple braces, but continues to have pain at the base of the thumb.  She would like to proceed with a steroid injection.  This was completed in clinic today.  Follow-up as needed  Procedure note injection Left shoulder    Verbal consent was obtained to inject the left first [facility] joint Timeout was completed to confirm the site of injection.  The skin was prepped with alcohol and ethyl chloride was sprayed at the injection site.  A 21-gauge needle was used to inject 40 mg of Depo-Medrol and 1% lidocaine (1 cc) into the first [facility] joint of the left thumb  There were no complications. A sterile bandage was applied.    Follow-up: Return if symptoms worsen or fail to improve. XR at next visit: None  Subjective:  Chief Complaint  Patient presents with   Routine Post Op    S/p RT trigger finger release, RT thumb, RT ring, RT index Pt c/o pain at ring finger and burning in the hand.    Injections    Pt request an injection in LEFT hand today    History of Present Illness: Lashawnda JRUE YAMBAO is a 51 y.o. female who presents following the above stated procedure.  Surgery on the right hand was approximately 3 months ago.  She continues to have some irritation at the incision site to the ring finger.  Other incisions have healed well without issue.  She is able make full fist.  She denies triggering sensations.   Regarding her left thumb, she has tried multiple braces.  She states that these irritate her thumb more than when she is not wearing the brace.  She has tried multiple over-the-counter treatments, with minimal resolution in her symptoms.  She is interested in an injection as result.   Review of Systems: No fevers or chills No numbness or tingling No Chest Pain No shortness of breath   Objective: There were no vitals taken for this visit.  Physical Exam:   Right hand without swelling.  She is able to make a full fist.  Thumb index and ring finger incisions have healed well.  No surrounding erythema or drainage.  Some discomfort with palpation to the ring finger.  Minimal scar tissue is appreciated.  Fingers are warm and well-perfused.  No active triggering is appreciated.  Left thumb with a well-healed surgical incision at the base of the thumb.  No surrounding erythema or drainage.  Pain with CMC grind testing.  Tenderness to palpation at the thumb [facility] joint.   IMAGING:  No new imaging obtained today.   Mordecai Rasmussen, MD 12/29/2021 2:14 PM

## 2021-12-29 NOTE — Patient Instructions (Signed)

## 2021-12-31 ENCOUNTER — Telehealth: Payer: Self-pay | Admitting: Pulmonary Disease

## 2021-12-31 DIAGNOSIS — J4540 Moderate persistent asthma, uncomplicated: Secondary | ICD-10-CM

## 2021-12-31 NOTE — Telephone Encounter (Signed)
Patient called and is needing refills on her Nucala.   Please advise thank you

## 2022-01-01 ENCOUNTER — Other Ambulatory Visit (HOSPITAL_COMMUNITY): Payer: Self-pay

## 2022-01-01 MED ORDER — NUCALA 100 MG/ML ~~LOC~~ SOAJ
100.0000 mg | SUBCUTANEOUS | 3 refills | Status: DC
  Filled 2022-01-01 – 2022-01-02 (×2): qty 1, 28d supply, fill #0
  Filled 2022-02-03: qty 1, 28d supply, fill #1
  Filled 2022-03-03: qty 1, 28d supply, fill #2
  Filled 2022-03-31: qty 1, 28d supply, fill #3
  Filled ????-??-??: fill #0

## 2022-01-01 NOTE — Telephone Encounter (Signed)
Refill sent for [facility] to [place]: [phone]   Dose: 100 mg SQ every 4 weeks  Last OV: 10/30/21 - video visit Provider: Dr. Silas Flood  Next OV: not scheduled but should be December 2023  Knox Saliva, PharmD, MPH, BCPS Clinical Pharmacist (Rheumatology and Pulmonology)

## 2022-01-02 ENCOUNTER — Other Ambulatory Visit (HOSPITAL_COMMUNITY): Payer: Self-pay

## 2022-01-05 ENCOUNTER — Ambulatory Visit
Admission: RE | Admit: 2022-01-05 | Discharge: 2022-01-05 | Disposition: A | Discharge status: Home / Self Care | Payer: Medicaid Other | Source: Ambulatory Visit

## 2022-01-05 ENCOUNTER — Ambulatory Visit: Payer: Medicaid Other

## 2022-01-05 VITALS — BP 122/86 | HR 83 | Temp 99.2°F | Resp 14

## 2022-01-05 DIAGNOSIS — J0100 Acute maxillary sinusitis, unspecified: Secondary | ICD-10-CM | POA: Diagnosis not present

## 2022-01-05 MED ORDER — AMOXICILLIN-POT CLAVULANATE 875-125 MG PO TABS: 1.0000 | ORAL_TABLET | Freq: Two times a day (BID) | ORAL | 0 refills | Status: DC

## 2022-01-05 NOTE — ED Triage Notes (Signed)
Pt presents with c/o nasal congestion, fever, HA that began Friday.

## 2022-01-05 NOTE — ED Provider Notes (Signed)
[name]    CSN: [id number] Arrival date & time: 01/05/22  1724      History   Chief Complaint Chief Complaint  Patient presents with   URI    Nausea, low grade fever - Entered by patient    HPI Judith [name] is a 51 y.o. female.   HPI 41-year-old female presents with nasal congestion and fever and headache that began on Friday or 3 days ago.  PMH significant for HTN, bipolar 1 disorder, obesity and sleep apnea.  Past Medical History:  Diagnosis Date   Allergy    Anxiety    Arthritis    Asthma    Bipolar 1 disorder ([place])    Blood transfusion without reported diagnosis    with child birth 4 yrs ago    Bronchitis, chronic ([place])    Constipation    COVID-19 virus infection 06/21/2020   DDD (degenerative disc disease), cervical    Depression    Diabetes mellitus without complication ([place])    Dysphagia    Family history of adverse reaction to anesthesia    mother had a hard time waking up after knee surgery in 2021   Gastroparesis    GERD (gastroesophageal reflux disease)    History of kidney stones    History of MRSA infection    Hyperlipemia    Hypothyroid    Migraines    Pneumonia    Sleep apnea    no cpap     Patient Active Problem List   Diagnosis Date Noted   Esophagogastric junction outflow obstruction    Gastric polyp    Cervical myelopathy ([place]) 10/09/2020   Mild intermittent asthma with acute exacerbation 10/04/2020   Dysphagia    Patellofemoral pain syndrome of both knees 04/19/2020   Tinnitus of right ear 03/15/2020   Closed fracture of one rib of left side 03/15/2020   Concussion with no loss of consciousness 02/05/2020   Spondylosis of cervical spine 02/05/2020   Acute bilateral thoracic back pain 02/05/2020   Acute bilateral low back pain without sciatica 02/05/2020   Gastroparesis 11/03/2019   Hospital discharge follow-up 11/03/2019   DDD (degenerative disc disease), lumbar 06/30/2019   Lumbar back pain with radiculopathy  affecting lower extremity 06/20/2019   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/21/2019   History of left knee replacement 02/10/2018   Morbid obesity ([place]) 11/21/2017   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   Episodic mood disorder ([place]) 05/12/2016   Controlled diabetes mellitus type 2 with complications ([place]) 75/64/3329   Gastroesophageal reflux disease without esophagitis 05/12/2016   DYSPNEA 07/22/2010   CHEST PAIN 07/22/2010   Hypothyroidism 09/13/2007   Hyperlipidemia 09/13/2007   Depression, major, single episode, moderate ([place]) 09/13/2007   MIGRAINE HEADACHE 09/13/2007   Essential hypertension 09/13/2007   Asthma 09/13/2007   CHOLELITHIASIS, HX OF 09/13/2007   NEPHROLITHIASIS, HX OF 09/13/2007   CARPAL TUNNEL RELEASE, HX OF 09/13/2007    Past Surgical History:  Procedure Laterality Date   ABDOMINAL SURGERY     ANTERIOR CERVICAL DECOMP/DISCECTOMY FUSION N/A 10/09/2020   Procedure: Anterior Cervical Discectomy Fusion Cervical five-six;  Surgeon: Vallarie Mare, MD;  Location: [place];  Service: Neurosurgery;  Laterality: N/A;   BIOPSY  02/04/2021   Procedure: BIOPSY;  Surgeon: Yetta Flock, MD;  [facility];  Service: Gastroenterology;;   BOTOX INJECTION N/A 02/04/2021   Procedure: BOTOX INJECTION;  Surgeon: Yetta Flock, MD;  Location: [facility];  Service: Gastroenterology;  Laterality: N/A;   CARPAL TUNNEL RELEASE Bilateral 1999   CESAREAN SECTION     CHOLECYSTECTOMY     COLONOSCOPY     ESOPHAGEAL MANOMETRY N/A 07/08/2020   Procedure: ESOPHAGEAL MANOMETRY (EM);  Surgeon: Yetta Flock, MD;  Location: [facility];  Service: Gastroenterology;  Laterality: N/A;   ESOPHAGOGASTRODUODENOSCOPY (EGD) WITH PROPOFOL N/A 02/04/2021   Procedure: ESOPHAGOGASTRODUODENOSCOPY (EGD) WITH PROPOFOL;  Surgeon: Yetta Flock, MD;  Location: [facility];  Service: Gastroenterology;  Laterality: N/A;   REPLACEMENT TOTAL  KNEE Bilateral    SCLEROTHERAPY  02/04/2021   Procedure: SCLEROTHERAPY;  Surgeon: Yetta Flock, MD;  Location: [facility];  Service: Gastroenterology;;   THYROIDECTOMY     TONSILLECTOMY     TONSILLECTOMY AND ADENOIDECTOMY     TRIGGER FINGER RELEASE Left 06/16/2021   Procedure: LEFT THUMB TRIGGER FINGER RELEASE;  Surgeon: Mordecai Rasmussen, MD;  Location: [facility];  Service: Orthopedics;  Laterality: Left;   TRIGGER FINGER RELEASE Right 09/29/2021   Procedure: RELEASE TRIGGER FINGER/A-1 PULLEY;  Surgeon: Mordecai Rasmussen, MD;  Location: [facility];  Service: Orthopedics;  Laterality: Right;  Right ring, index and thumb trigger finger   UPPER GASTROINTESTINAL ENDOSCOPY      OB History     Gravida  1   Para  1   Term  1   Preterm      AB      Living         SAB      IAB      Ectopic      Multiple      Live Births               Home Medications    Prior to Admission medications   Medication Sig Start Date End Date Taking? Authorizing Provider  amoxicillin-clavulanate (AUGMENTIN) 875-125 MG tablet Take 1 tablet by mouth every 12 (twelve) hours. 01/05/22  Yes Eliezer Lofts, FNP  buPROPion ER [facility] SR) 100 MG 12 hr tablet Take 100 mg by mouth 2 (two) times daily.   Yes [provider]  hydrOXYzine (ATARAX) 10 MG tablet Take 10 mg by mouth 3 (three) times daily as needed.   Yes [provider]  Accu-Chek FastClix Lancets MISC Test blood sugars daily Dx E11.9 11/27/21   Ronnie Doss M, DO  albuterol (PROVENTIL) (2.5 MG/3ML) 0.083% nebulizer solution Take 3 mLs (2.5 mg total) by nebulization every 6 (six) hours as needed for wheezing or shortness of breath. Fill upon patient request 09/26/20   Martyn Ehrich, NP  albuterol (VENTOLIN HFA) 108 (90 Base) MCG/ACT inhaler Inhale 2 puffs into the lungs every 6 (six) hours as needed for wheezing or shortness of breath. Fill upon patient request 09/26/20   Martyn Ehrich, NP  baclofen (LIORESAL) 10  MG tablet TAKE 0.5-1 TABLETS BY MOUTH 3 TIMES DAILY AS NEEDED FOR MUSCLE SPASMS. Patient taking differently: Take 10 mg by mouth 3 (three) times daily. 01/27/21   Janora Norlander, DO  Blood Glucose Monitoring Suppl (ACCU-CHEK GUIDE ME) w/Device KIT Use to check BS BID and as needed. DX: E11.9 06/25/20   Ronnie Doss M, DO  budesonide-formoterol (SYMBICORT) 160-4.5 MCG/ACT inhaler Inhale 2 puffs into the lungs 2 (two) times daily. Patient taking differently: Inhale 2 puffs into the lungs 2 (two) times daily as needed (shortness of breath). 09/26/20   Martyn Ehrich, NP  cetirizine (ZYRTEC) 10 MG tablet Take 10 mg by mouth daily as needed for allergies.  [provider]  Continuous Blood Gluc Sensor (FREESTYLE LIBRE 3 SENSOR) MISC 2 each by Does not apply route every 14 (fourteen) days. Place 1 sensor on the skin every 14 days. Use to check glucose continuously 09/16/21   Ronnie Doss M, DO  cycloSPORINE (RESTASIS) 0.05 % ophthalmic emulsion Place 1 drop into both eyes 2 (two) times daily. Patient not taking: Reported on 01/05/2022    [provider]  dexlansoprazole (DEXILANT) 60 MG capsule Take 1 capsule (60 mg total) by mouth daily. Patient taking differently: Take 60 mg by mouth at bedtime. 01/13/21   Armbruster, Carlota Raspberry, MD  diclofenac Sodium (VOLTAREN) 1 % GEL APPLY 4 GRAMS TOPICALLY 4 (FOUR) TIMES DAILY AS NEEDED (KNEE PAIN). 01/27/21   Janora Norlander, DO  dicyclomine (BENTYL) 20 MG tablet TAKE 1 TABLET (20 MG TOTAL) BY MOUTH EVERY 6 (SIX) HOURS. Patient taking differently: Take 20 mg by mouth every 6 (six) hours as needed for spasms (abdominal cramps). 08/07/20   Janora Norlander, DO  docusate sodium (COLACE) 100 MG capsule Take 1 capsule (100 mg total) by mouth 2 (two) times daily. Patient taking differently: Take 100 mg by mouth 2 (two) times daily as needed for mild constipation. 10/10/20   Vallarie Mare, MD  gabapentin (NEURONTIN) 300 MG capsule TAKE  1 CAPSULE BY MOUTH THREE TIMES A DAY 12/26/21   Ronnie Doss M, DO  glucose blood (ACCU-CHEK GUIDE) test strip Test blood sugars daily Dx E11.9 11/27/21   Ronnie Doss M, DO  hydrochlorothiazide (HYDRODIURIL) 25 MG tablet Take 25 mg by mouth daily. Patient not taking: Reported on 01/05/2022 06/10/21   [provider]  ibuprofen (ADVIL) 600 MG tablet TAKE 1 TABLET (600 MG TOTAL) BY MOUTH AT BEDTIME AS NEEDED. 11/27/21   Ronnie Doss M, DO  insulin degludec (TRESIBA FLEXTOUCH) 200 UNIT/ML FlexTouch Pen Inject 86 Units into the skin at bedtime. 08/15/21   Janora Norlander, DO  Insulin Pen Needle (B-D UF III MINI PEN NEEDLES) 31G X 5 MM MISC use as directed once daily Dx E11.8 02/17/21   Janora Norlander, DO  lamoTRIgine (LAMICTAL) 150 MG tablet 1 tablet PO BID Patient taking differently: Take 300 mg by mouth daily. 04/29/21   Ronnie Doss M, DO  levothyroxine (SYNTHROID) 125 MCG tablet TAKE 1 TABLET BY MOUTH EVERY DAY 12/29/21   Ronnie Doss M, DO  medroxyPROGESTERone (DEPO-PROVERA) 150 MG/ML injection Inject 1 mL (150 mg total) into the muscle every 3 (three) months. 04/29/21   Janora Norlander, DO  Mepolizumab (NUCALA) 100 MG/ML SOAJ Inject 1 mL (100 mg total) into the skin every 28 (twenty-eight) days. 01/01/22   Hunsucker, Bonna Gains, MD  montelukast (SINGULAIR) 10 MG tablet TAKE ONE TAB BY MOUTH AT BEDTIME 04/29/21   Ronnie Doss [place], DO  MOUNJARO 5 MG/0.5ML Pen INJECT 5 MG SUBCUTANEOUSLY WEEKLY 12/26/21   Ronnie Doss M, DO  nystatin cream (MYCOSTATIN) Apply 1 Application topically 2 (two) times daily. x2 weeks 12/26/21   Ronnie Doss M, DO  ondansetron (ZOFRAN-ODT) 4 MG disintegrating tablet TAKE 1 TO 2 TABLETS (4 TO 8 MG TOTAL) BY MOUTH EVERY 8 HOURS AS NEEDED FOR NAUSEA AND VOMITING 11/07/21   Dettinger, Fransisca Kaufmann, MD  ondansetron (ZOFRAN-ODT) 4 MG disintegrating tablet Take 1-2 tablets (4-8 mg total) by mouth every 8 (eight) hours as needed for nausea  or vomiting. 11/10/21   Ronnie Doss M, DO  pioglitazone (ACTOS) 45 MG tablet TAKE 1 TABLET BY MOUTH EVERY DAY  07/23/21   Raliegh Ip, DO  promethazine (PHENERGAN) 25 MG tablet Take 1 tablet (25 mg total) by mouth every 6 (six) hours as needed for nausea or vomiting. 06/11/20   Myra Rude, MD  rosuvastatin (CRESTOR) 40 MG tablet Take 1 tablet (40 mg total) by mouth at bedtime. 04/29/21   Raliegh Ip, DO  sertraline (ZOLOFT) 100 MG tablet Take 2 tablets (200 mg total) by mouth daily. 04/29/21   Raliegh Ip, DO    Family History Family History  Problem Relation Age of Onset   Mental illness Other    Diabetes Other    Healthy Mother    Cancer Father    Non-Hodgkin's lymphoma Father    Breast cancer Paternal Grandmother    Lung cancer Paternal Grandmother    Esophageal cancer Other    Colon cancer Neg Hx    Stomach cancer Neg Hx    Pancreatic cancer Neg Hx    Colon polyps Neg Hx     Social History Social History   Tobacco Use   Smoking status: Former    Packs/day: 1.00    Years: 5.00    Total pack years: 5.00    Types: Cigarettes    Quit date: 04/04/1993    Years since quitting: 28.7   Smokeless tobacco: Never   Tobacco comments:    smoked when went out with friends/social smoker. pack would last a week.  Vaping Use   Vaping Use: Former  Substance Use Topics   Alcohol use: No   Drug use: No     Allergies   Diphenhydramine, Morphine, Prednisone, Adhesive [tape], Erythromycin, Metformin and related, and Sulfonamide derivatives   Review of Systems Review of Systems  Constitutional:  Positive for fever.  HENT:  Positive for congestion.   Neurological:  Positive for headaches.     Physical Exam Triage Vital Signs ED Triage Vitals  Enc Vitals Group     BP 01/05/22 1741 122/86     Pulse Rate 01/05/22 1741 83     Resp 01/05/22 1741 14     Temp 01/05/22 1741 99.2 F (37.3 C)     Temp Source 01/05/22 1741 Oral     SpO2 01/05/22  1741 96 %     Weight --      Height --      Head Circumference --      Peak Flow --      Pain Score 01/05/22 1737 3     Pain Loc --      Pain Edu? --      Excl. in GC? --    No data found.  Updated Vital Signs BP 122/86 (BP Location: Right Arm)   Pulse 83   Temp 99.2 F (37.3 C) (Oral)   Resp 14   SpO2 96%      Physical Exam Vitals and nursing note reviewed.  Constitutional:      General: She is not in acute distress.    Appearance: She is obese. She is not ill-appearing.  HENT:     Head: Normocephalic and atraumatic.     Right Ear: Tympanic membrane, ear canal and external ear normal.     Left Ear: Tympanic membrane, ear canal and external ear normal.     Mouth/Throat:     Mouth: Mucous membranes are moist.     Pharynx: Oropharynx is clear.  Eyes:     Extraocular Movements: Extraocular movements intact.     Conjunctiva/sclera: Conjunctivae normal.  Pupils: Pupils are equal, round, and reactive to light.  Cardiovascular:     Rate and Rhythm: Normal rate and regular rhythm.     Pulses: Normal pulses.     Heart sounds: Normal heart sounds.  Pulmonary:     Effort: Pulmonary effort is normal.     Breath sounds: Normal breath sounds. No wheezing, rhonchi or rales.  Musculoskeletal:        General: Normal range of motion.     Cervical back: Normal range of motion and neck supple. No tenderness.  Lymphadenopathy:     Cervical: No cervical adenopathy.  Skin:    General: Skin is warm and dry.  Neurological:     General: No focal deficit present.     Mental Status: She is alert and oriented to person, place, and time. Mental status is at baseline.      UC Treatments / Results  Labs (all labs ordered are listed, but only abnormal results are displayed) Labs Reviewed  SARS CORONAVIRUS 2 BY RT PCR    EKG   Radiology No results found.  Procedures Procedures (including critical care time)  Medications Ordered in UC Medications - No data to  display  Initial Impression / Assessment and Plan / UC Course  I have reviewed the triage vital signs and the nursing notes.  Pertinent labs & imaging results that were available during my care of the patient were reviewed by me and considered in my medical decision making (see chart for details).     MDM: 1.  Subacute maxillary sinusitis-Rx'd Augmentin. Advised patient to hold Augmentin for the next 3 to 4 days before starting.  Advised if symptoms worsen please start medication and take to completion.  Encouraged patient to increase daily water intake while taking this medication.  Advised patient if symptoms worsen and/or unresolved please follow-up with PCP or here for further evaluation.  Patient discharged home, hemodynamically stable. Final Clinical Impressions(s) / UC Diagnoses   Final diagnoses:  Subacute maxillary sinusitis     Discharge Instructions      Advised patient to hold Augmentin for the next 3 to 4 days before starting.  Advised if symptoms worsen please start medication and take to completion.  Encouraged patient to increase daily water intake while taking this medication.  Advised patient if symptoms worsen and/or unresolved please follow-up with PCP or here for further evaluation.     ED Prescriptions     Medication Sig Dispense Auth. Provider   amoxicillin-clavulanate (AUGMENTIN) 875-125 MG tablet Take 1 tablet by mouth every 12 (twelve) hours. 14 tablet Eliezer Lofts, FNP      PDMP not reviewed this encounter.   Eliezer Lofts, [place] 01/05/22 [phone]

## 2022-01-05 NOTE — Discharge Instructions (Addendum)
Advised patient to hold Augmentin for the next 3 to 4 days before starting.  Advised if symptoms worsen please start medication and take to completion.  Encouraged patient to increase daily water intake while taking this medication.  Advised patient if symptoms worsen and/or unresolved please follow-up with PCP or here for further evaluation.

## 2022-01-06 ENCOUNTER — Telehealth: Payer: Self-pay

## 2022-01-06 ENCOUNTER — Telehealth: Payer: Self-pay | Admitting: Family Medicine

## 2022-01-06 LAB — SARS CORONAVIRUS 2 BY RT PCR: SARS Coronavirus 2 by RT PCR: POSITIVE — AB

## 2022-01-06 MED ORDER — MOLNUPIRAVIR EUA 200MG CAPSULE: 4.0000 | ORAL_CAPSULE | Freq: Two times a day (BID) | ORAL | 0 refills | Status: AC

## 2022-01-06 NOTE — Telephone Encounter (Signed)
TC x 3 to inform of COVID positive result from yesterday per M. Ragan, [place]; no answer, so left VM. Informed of status and that Rx has been sent to [facility]. Advised to call if questions/concerns.

## 2022-01-06 NOTE — Telephone Encounter (Signed)
Patient notified of positive COVID-19.  Advised will send the Molupiravir to her pharmacy.  Advised patient to take medication as directed with food to completion.  Advised patient should self quarantine until Sunday, 01/11/2022.

## 2022-01-07 ENCOUNTER — Ambulatory Visit: Payer: Medicaid Other

## 2022-01-07 ENCOUNTER — Ambulatory Visit (INDEPENDENT_AMBULATORY_CARE_PROVIDER_SITE_OTHER): Payer: Medicaid Other | Admitting: Pharmacist

## 2022-01-07 ENCOUNTER — Encounter: Payer: Self-pay | Admitting: Pharmacist

## 2022-01-07 DIAGNOSIS — E118 Type 2 diabetes mellitus with unspecified complications: Secondary | ICD-10-CM

## 2022-01-07 DIAGNOSIS — Z794 Long term (current) use of insulin: Secondary | ICD-10-CM

## 2022-01-07 MED ORDER — TIRZEPATIDE 7.5 MG/0.5ML ~~LOC~~ SOAJ: 7.5000 mg | SUBCUTANEOUS | 2 refills | Status: DC

## 2022-01-07 NOTE — Progress Notes (Signed)
    01/07/2022 Name: Judith [name] MRN: [id number] DOB: Jan 31, 1971   S:  51 yoF Presents for follow up diabetes evaluation, education, and management.  Patient  with T2DM on newly startted Mounjaroy reportes she is doing well and loves the medication.  Her sugars and better controlled and she reports an 8lb weight loss over the past month.  She denies side effects.  She is interested in increasing her dose to support additional gylcemic control and weight loss. Patient was referred and last seen by Primary Care Provider on 09/26/21 and PharmD on 11/27/21.   Insurance coverage/medication affordability:  medicaid   Patient reports adherence with medications. Current diabetes medications include: Debbora Presto '5mg'$ -->7.'5mg'$  weekly Current hypertension medications include: lisinopril Goal 130/80 Current hyperlipidemia medications include: rosuvastatin   Patient denies hypoglycemic events.   Discussed meal planning options and Plate method for healthy eating Avoid sugary drinks and desserts Incorporate balanced protein, non starchy veggies, 1 serving of carbohydrate with each meal Increase water intake Increase physical activity as able   Patient-reported exercise habits: n/a      O:  Lab Results  Component Value Date   HGBA1C 6.9 (H) 12/29/2021    WEIGHT 319LBS -->TODAY HER WEIGHT IS 311LBS (8 LB WEIGHT LOSS)  Lipid Panel     Component Value Date/Time   CHOL 144 01/22/2021 1315   TRIG 120 01/22/2021 1315   HDL 30 (L) 01/22/2021 1315   CHOLHDL 4.8 (H) 01/22/2021 1315   LDLCALC 92 01/22/2021 1315      Clinical Atherosclerotic Cardiovascular Disease (ASCVD): No   The 10-year ASCVD risk score (Arnett DK, et al., 2019) is: 4.3%   Values used to calculate the score:     Age: 51 years     Sex: Female     Is Non-Hispanic African American: No     Diabetic: Yes     Tobacco smoker: No     Systolic Blood Pressure: 469 mmHg     Is BP treated: Yes     HDL Cholesterol:  30 mg/dL     Total Cholesterol: 144 mg/dL    A/P:  Diabetes T2DM with improved glycemic control-->A1c now 6.8%currently UNCONTROLLED. She is currently wearing libre 3 CGM.  She is having post prandial highs up to 250 and hypoglycemia overnight.  Patient is also interested in additional weight loss.   Past T2DM intolerances: trulicty and ozempic  (GI distress); She has tried rybelsus and tolerated it, however it did not support her weight loss endeavors   -INCREASE MOUNJARO TO 7.'5MG'$  WEEKLY Patient denies side effects with Mounjaro '5mg'$  weekly;  She is tolerating well and would like to continue increasing her dose to support glycemic control, hopefully decrease insulin requirements and support weight loss -REPORTS 8 lb weight loss in ~3 weeks -remains motivated -no hypoglycemia, will work to adjust insulin as needed  -Denies personal and family history of Medullary thyroid cancer ([place])  -Continue Tresiba as prescirbed, may look at decreasing dose due to increased mounjaro  -Discontinue pioglitazone  -Extensively discussed pathophysiology of diabetes, recommended lifestyle interventions, dietary effects on blood sugar control  -Counseled on s/sx of and management of hypoglycemia  -Next A1C anticipated 11/23.   Written patient instructions provided.  Total time in counseling 25 minutes.   Follow up Pharmacist Clinic Visit ON 02/05/22.   Regina Eck, PharmD, BCPS Clinical Pharmacist, [place]  II Phone [phone]

## 2022-01-12 ENCOUNTER — Ambulatory Visit (INDEPENDENT_AMBULATORY_CARE_PROVIDER_SITE_OTHER): Payer: Medicaid Other | Admitting: *Deleted

## 2022-01-12 ENCOUNTER — Other Ambulatory Visit (HOSPITAL_COMMUNITY): Payer: Self-pay

## 2022-01-12 DIAGNOSIS — Z3042 Encounter for surveillance of injectable contraceptive: Secondary | ICD-10-CM

## 2022-01-12 MED ORDER — MEDROXYPROGESTERONE ACETATE 150 MG/ML IM SUSP
150.0000 mg | INTRAMUSCULAR | Status: AC
  Administered 2022-01-12 – 2022-09-11 (×4): 150 mg via INTRAMUSCULAR

## 2022-01-12 NOTE — Progress Notes (Signed)
DepoProvera given and patient tolerated well.

## 2022-01-23 ENCOUNTER — Encounter: Payer: Self-pay | Admitting: Family Medicine

## 2022-01-26 MED ORDER — ACCU-CHEK GUIDE ME W/DEVICE KIT: PACK | 0 refills | Status: DC

## 2022-01-27 ENCOUNTER — Telehealth: Payer: Self-pay | Admitting: Family Medicine

## 2022-01-27 DIAGNOSIS — E109 Type 1 diabetes mellitus without complications: Secondary | ICD-10-CM

## 2022-01-27 NOTE — Telephone Encounter (Signed)
Your information has been sent to [place] Medicaid.

## 2022-01-28 ENCOUNTER — Other Ambulatory Visit: Payer: Self-pay | Admitting: Family Medicine

## 2022-01-28 DIAGNOSIS — R110 Nausea: Secondary | ICD-10-CM

## 2022-01-28 DIAGNOSIS — M5136 Other intervertebral disc degeneration, lumbar region: Secondary | ICD-10-CM | POA: Diagnosis not present

## 2022-01-28 NOTE — Telephone Encounter (Signed)
Prior Auth for [employer] DENIED

## 2022-01-28 NOTE — Telephone Encounter (Signed)
Can you help?

## 2022-02-03 ENCOUNTER — Other Ambulatory Visit (HOSPITAL_COMMUNITY): Payer: Self-pay

## 2022-02-05 ENCOUNTER — Ambulatory Visit (INDEPENDENT_AMBULATORY_CARE_PROVIDER_SITE_OTHER): Payer: Medicaid Other | Admitting: Pharmacist

## 2022-02-05 ENCOUNTER — Encounter: Payer: Self-pay | Admitting: Pharmacist

## 2022-02-05 VITALS — Wt 309.0 lb

## 2022-02-05 DIAGNOSIS — E1165 Type 2 diabetes mellitus with hyperglycemia: Secondary | ICD-10-CM | POA: Diagnosis not present

## 2022-02-05 MED ORDER — TIRZEPATIDE 10 MG/0.5ML ~~LOC~~ SOAJ: 10.0000 mg | SUBCUTANEOUS | 3 refills | Status: DC

## 2022-02-05 NOTE — Progress Notes (Signed)
02/05/2022 Name: Judith [name] MRN: [id number]       DOB: 02/17/1971     S:  51 yoF Presents for follow up diabetes evaluation, education, and management.  Patient with T2DM continues on [facility] and is doing well. She denies side effects and feels fuller quicker.   Her sugars and better controlled and she reports an 15lb weight loss since starting on Mounjaro.  She is interested in increasing her dose to support additional gylcemic control and weight loss. Patient was referred and last seen by Primary Care Provider on 09/26/21 and PharmD on 01/07/22.   Insurance coverage/medication affordability: [place] medicaid   Patient reports adherence with medications. Current diabetes medications include: Debbora Presto 7.'5mg'$ -->'10mg'$  weekly Current hypertension medications include: lisinopril Goal 130/80 Current hyperlipidemia medications include: rosuvastatin   Patient denies hypoglycemic events.   Discussed meal planning options and Plate method for healthy eating Avoid sugary drinks and desserts Incorporate balanced protein, non starchy veggies, 1 serving of carbohydrate with each meal Increase water intake Increase physical activity as able   Patient-reported exercise habits: n/a     O:   Recent Labs       Lab Results  Component Value Date    HGBA1C 6.9 (H) 12/29/2021        WEIGHT 319LBS -->TODAY HER WEIGHT IS 307LBS (15 LB WEIGHT LOSS)   Lipid Panel   Labs (Brief)          Component Value Date/Time    CHOL 144 01/22/2021 1315    TRIG 120 01/22/2021 1315    HDL 30 (L) 01/22/2021 1315    CHOLHDL 4.8 (H) 01/22/2021 1315    LDLCALC 92 01/22/2021 1315          Clinical Atherosclerotic Cardiovascular Disease (ASCVD): No    The 10-year ASCVD risk score (Arnett DK, et al., 2019) is: 4.3%   Values used to calculate the score:     Age: 51 years     Sex: Female     Is Non-Hispanic African American: No     Diabetic: Yes     Tobacco smoker: No     Systolic Blood  Pressure: 122 mmHg     Is BP treated: Yes     HDL Cholesterol: 30 mg/dL     Total Cholesterol: 144 mg/dL     A/P:   Diabetes T2DM with improved glycemic control-->A1c now 6.8% currently CONTROLLED. She continues wearing her freestyle libre 3 CGM.  Her blood sugars remain within normal limits.  She is having post prandial highs up to 250 and hypoglycemia overnight.  Patient is also interested in additional weight loss.   Past T2DM intolerances: trulicty and ozempic  (GI distress); She has tried rybelsus and tolerated it, however it did not support her weight loss endeavors    -INCREASE MOUNJARO TO '10MG'$  WEEKLY after her 7.'5mg'$  box is complete Patient denies side effects with Mounjaro weekly;  She is tolerating well and would like to continue increasing her dose to support glycemic control, hopefully decrease insulin requirements and support weight loss -REPORTS 15 lb weight loss in ~12 weeks -remains motivated -no hypoglycemia, will work to adjust insulin as needed             -Denies personal and family history of Medullary thyroid cancer (MTC)   -Continue Tresiba as prescirbed, may look at decreasing dose due to increased mounjaro   -Pioglitazone was discontinued at last visit   -Extensively discussed pathophysiology of diabetes, recommended lifestyle interventions,  dietary effects on blood sugar control   -Counseled on s/sx of and management of hypoglycemia   -Next A1C anticipated 11/23.    Written patient instructions provided.  Total time in counseling 25 minutes.    Follow up Pharmacist Clinic Visit ON 02/05/22.    Regina Eck, PharmD, BCPS Clinical Pharmacist, [place]  II Phone [phone]

## 2022-02-06 NOTE — Telephone Encounter (Signed)
RESUBMITTED PA--MAY NOT BE ABLE TO, BUT WORTH A TRY! IF DENIED AGAIN --WE CAN APPEAL MAKE SURE YOU ARE ATTACHING CLINICAL NOTES IF YOU READ THE PA--IT STATES CLINICAL NOTES MUST BE ATTACHED

## 2022-02-10 ENCOUNTER — Other Ambulatory Visit: Payer: Self-pay | Admitting: Family Medicine

## 2022-02-10 ENCOUNTER — Ambulatory Visit: Payer: Medicaid Other | Admitting: Obstetrics and Gynecology

## 2022-02-10 ENCOUNTER — Telehealth: Payer: Self-pay | Admitting: Pharmacist

## 2022-02-10 DIAGNOSIS — R110 Nausea: Secondary | ICD-10-CM

## 2022-02-10 DIAGNOSIS — G8929 Other chronic pain: Secondary | ICD-10-CM

## 2022-02-10 MED ORDER — BD PEN NEEDLE MINI U/F 31G X 5 MM MISC: 4 refills | Status: DC

## 2022-02-10 MED ORDER — DICLOFENAC SODIUM 1 % EX GEL: 2.0000 g | Freq: Four times a day (QID) | CUTANEOUS | 3 refills | Status: DC

## 2022-02-10 MED ORDER — LINACLOTIDE 72 MCG PO CAPS: 72.0000 ug | ORAL_CAPSULE | Freq: Every day | ORAL | 3 refills | Status: DC

## 2022-02-10 MED ORDER — LINACLOTIDE 72 MCG PO CAPS
72.0000 ug | ORAL_CAPSULE | Freq: Every day | ORAL | 3 refills | Status: AC
Start: 2022-02-10 — End: ?

## 2022-02-10 MED ORDER — ONDANSETRON 4 MG PO TBDP: 4.0000 mg | ORAL_TABLET | Freq: Three times a day (TID) | ORAL | 0 refills | Status: DC | PRN

## 2022-02-10 NOTE — Telephone Encounter (Signed)
Pt aware.

## 2022-02-10 NOTE — Telephone Encounter (Signed)
Requesting refill of linzess--consitpation with mounjaro  OTC not working GI has placed on linzess this past march 2023 She has not followed with them since Will route to PCP to see if refill is possible

## 2022-02-10 NOTE — Telephone Encounter (Signed)
What dose of linzess was she on?  Ok to send.

## 2022-02-10 NOTE — Telephone Encounter (Signed)
[place] approved for patient Please let patient and pharmacy know (if needed)

## 2022-02-10 NOTE — Telephone Encounter (Signed)
Linzess sent to PCP for [facility]

## 2022-02-10 NOTE — Addendum Note (Signed)
Addended by: Everlean Cherry on: 02/10/2022 03:58 PM   Modules accepted: Orders

## 2022-02-17 DIAGNOSIS — M5416 Radiculopathy, lumbar region: Secondary | ICD-10-CM | POA: Diagnosis not present

## 2022-02-25 ENCOUNTER — Telehealth: Payer: Self-pay

## 2022-02-25 NOTE — Telephone Encounter (Signed)
PT COME IN TODAY AND WEIGHED- NEW WEIGHT AS OF 02/25/22 IS 304.6LBS

## 2022-02-27 DIAGNOSIS — R0789 Other chest pain: Secondary | ICD-10-CM | POA: Diagnosis not present

## 2022-02-27 DIAGNOSIS — J45909 Unspecified asthma, uncomplicated: Secondary | ICD-10-CM | POA: Diagnosis not present

## 2022-02-27 DIAGNOSIS — E119 Type 2 diabetes mellitus without complications: Secondary | ICD-10-CM | POA: Diagnosis not present

## 2022-02-27 DIAGNOSIS — M940 Chondrocostal junction syndrome [Tietze]: Secondary | ICD-10-CM | POA: Diagnosis not present

## 2022-03-03 ENCOUNTER — Other Ambulatory Visit (HOSPITAL_COMMUNITY): Payer: Self-pay

## 2022-03-05 ENCOUNTER — Other Ambulatory Visit: Payer: Self-pay | Admitting: Family Medicine

## 2022-03-05 DIAGNOSIS — E1169 Type 2 diabetes mellitus with other specified complication: Secondary | ICD-10-CM

## 2022-03-09 ENCOUNTER — Encounter: Payer: Self-pay | Admitting: *Deleted

## 2022-03-09 ENCOUNTER — Other Ambulatory Visit (HOSPITAL_COMMUNITY): Payer: Self-pay

## 2022-03-20 ENCOUNTER — Other Ambulatory Visit: Payer: Self-pay | Admitting: Gastroenterology

## 2022-03-20 ENCOUNTER — Other Ambulatory Visit: Payer: Self-pay | Admitting: Family Medicine

## 2022-03-20 DIAGNOSIS — M5136 Other intervertebral disc degeneration, lumbar region: Secondary | ICD-10-CM

## 2022-03-20 DIAGNOSIS — M5030 Other cervical disc degeneration, unspecified cervical region: Secondary | ICD-10-CM

## 2022-03-20 DIAGNOSIS — R110 Nausea: Secondary | ICD-10-CM

## 2022-03-21 ENCOUNTER — Other Ambulatory Visit: Payer: Self-pay | Admitting: Gastroenterology

## 2022-03-24 ENCOUNTER — Telehealth: Payer: Self-pay | Admitting: Family Medicine

## 2022-03-24 DIAGNOSIS — M546 Pain in thoracic spine: Secondary | ICD-10-CM | POA: Diagnosis not present

## 2022-03-24 DIAGNOSIS — E1165 Type 2 diabetes mellitus with hyperglycemia: Secondary | ICD-10-CM

## 2022-03-24 DIAGNOSIS — M5416 Radiculopathy, lumbar region: Secondary | ICD-10-CM | POA: Diagnosis not present

## 2022-03-24 MED ORDER — FREESTYLE LIBRE 3 SENSOR MISC: 99 refills | Status: DC

## 2022-03-24 MED ORDER — FREESTYLE LIBRE 3 SENSOR MISC: 2.0000 | 99 refills | Status: DC

## 2022-03-24 MED ORDER — TIRZEPATIDE 12.5 MG/0.5ML ~~LOC~~ SOAJ: 12.5000 mg | SUBCUTANEOUS | 99 refills | Status: DC

## 2022-03-24 NOTE — Telephone Encounter (Signed)
Pt aware of mounjaro - states [facility] said she is not eligible to pick up [employer]

## 2022-03-24 NOTE — Telephone Encounter (Signed)
Called patient   PA should be fine [facility] needs [facility] Sample of libre3 left up front in the meantime

## 2022-03-24 NOTE — Telephone Encounter (Signed)
done

## 2022-03-24 NOTE — Addendum Note (Signed)
Addended by: Lottie Dawson D on: 03/24/2022 03:20 PM   Modules accepted: Orders

## 2022-03-25 ENCOUNTER — Telehealth: Payer: Self-pay

## 2022-03-25 NOTE — Telephone Encounter (Signed)
Judith [name] Free- Pt come in today and weighed- she is 297lb    Dr.g- she is going for her lower back mri on 11/27 and has appt with her specialist on 05/25/22

## 2022-03-26 ENCOUNTER — Telehealth: Payer: Self-pay

## 2022-03-26 NOTE — Telephone Encounter (Signed)
Judith [name] (Key: [id number]) Tyler Aas FlexTouch (insulin degludec injection) 200 Units/mL solution   Form [employer] [facility] [place] Form [phone] NCPDP) Created 16 hours ago Sent to Plan 2 hours ago Plan Response 2 hours ago Submit Clinical Questions Determination Message from Plan Available without authorization.

## 2022-03-27 ENCOUNTER — Telehealth: Payer: Self-pay | Admitting: Pharmacist

## 2022-03-27 NOTE — Telephone Encounter (Signed)
Received notification from [facility] that patient's Nucala due for PA renewal. Submitted a Prior Authorization RENEWAL request to  [facility]  for NUCALA via CoverMyMeds. Will update once we receive a response.  Key: Selina Cooley, PharmD, MPH, BCPS, CPP Clinical Pharmacist (Rheumatology and Pulmonology)

## 2022-03-27 NOTE — Telephone Encounter (Signed)
Received notification from  [facility]  regarding a prior authorization for [place]. Authorization has been APPROVED from 03/27/22 to 03/27/23.   Patient can continue to fill through [place]: [phone]   Authorization # [id number]  [place] updated  Knox Saliva, PharmD, MPH, BCPS, CPP Clinical Pharmacist (Rheumatology and Pulmonology)

## 2022-03-30 ENCOUNTER — Other Ambulatory Visit: Payer: Self-pay | Admitting: Family Medicine

## 2022-03-30 ENCOUNTER — Telehealth: Payer: Self-pay | Admitting: Family Medicine

## 2022-03-30 DIAGNOSIS — E039 Hypothyroidism, unspecified: Secondary | ICD-10-CM

## 2022-03-30 MED ORDER — MEDROXYPROGESTERONE ACETATE 150 MG/ML IM SUSP: 150.0000 mg | INTRAMUSCULAR | 0 refills | Status: DC

## 2022-03-30 NOTE — Telephone Encounter (Signed)
Pt aware refill sent to pharmacy & to make her appt w/ PCP in December

## 2022-03-31 ENCOUNTER — Other Ambulatory Visit (HOSPITAL_COMMUNITY): Payer: Self-pay

## 2022-04-01 ENCOUNTER — Ambulatory Visit (INDEPENDENT_AMBULATORY_CARE_PROVIDER_SITE_OTHER): Payer: Medicaid Other

## 2022-04-01 DIAGNOSIS — Z3042 Encounter for surveillance of injectable contraceptive: Secondary | ICD-10-CM | POA: Diagnosis not present

## 2022-04-01 NOTE — Progress Notes (Signed)
Medroxyprogesterone injection given to left upper outer quadrant.  Patient tolerated well.

## 2022-04-06 ENCOUNTER — Other Ambulatory Visit (HOSPITAL_COMMUNITY): Payer: Self-pay

## 2022-04-06 ENCOUNTER — Other Ambulatory Visit: Payer: Self-pay | Admitting: Family Medicine

## 2022-04-06 DIAGNOSIS — E1169 Type 2 diabetes mellitus with other specified complication: Secondary | ICD-10-CM

## 2022-04-13 ENCOUNTER — Other Ambulatory Visit: Payer: Self-pay | Admitting: Family Medicine

## 2022-04-13 DIAGNOSIS — M5416 Radiculopathy, lumbar region: Secondary | ICD-10-CM | POA: Diagnosis not present

## 2022-04-13 DIAGNOSIS — J4540 Moderate persistent asthma, uncomplicated: Secondary | ICD-10-CM

## 2022-04-13 DIAGNOSIS — R110 Nausea: Secondary | ICD-10-CM

## 2022-04-14 NOTE — Telephone Encounter (Signed)
Needs to follow up with GI if having chronic need for this medication.  She's gone through #30 in 3 weeks.  That's a lot of Zofran.  Last RF until she sees GI so use sparingly.

## 2022-04-15 NOTE — Telephone Encounter (Signed)
LMOVM for pt w/ PCPs recommendation regarding RF

## 2022-04-20 ENCOUNTER — Other Ambulatory Visit (HOSPITAL_COMMUNITY): Payer: Self-pay

## 2022-04-24 ENCOUNTER — Other Ambulatory Visit: Payer: Self-pay | Admitting: Family Medicine

## 2022-04-24 DIAGNOSIS — E039 Hypothyroidism, unspecified: Secondary | ICD-10-CM

## 2022-04-28 ENCOUNTER — Other Ambulatory Visit (HOSPITAL_COMMUNITY): Payer: Self-pay

## 2022-04-28 ENCOUNTER — Telehealth: Payer: Self-pay | Admitting: Pulmonary Disease

## 2022-04-28 DIAGNOSIS — F439 Reaction to severe stress, unspecified: Secondary | ICD-10-CM | POA: Diagnosis not present

## 2022-04-28 DIAGNOSIS — J4540 Moderate persistent asthma, uncomplicated: Secondary | ICD-10-CM

## 2022-04-28 DIAGNOSIS — F3181 Bipolar II disorder: Secondary | ICD-10-CM | POA: Diagnosis not present

## 2022-04-28 DIAGNOSIS — F411 Generalized anxiety disorder: Secondary | ICD-10-CM | POA: Diagnosis not present

## 2022-04-28 MED ORDER — NUCALA 100 MG/ML ~~LOC~~ SOAJ
100.0000 mg | SUBCUTANEOUS | 1 refills | Status: DC
  Filled 2022-04-28 – 2022-05-01 (×2): qty 1, 28d supply, fill #0
  Filled 2022-05-27: qty 1, 28d supply, fill #1

## 2022-04-28 NOTE — Telephone Encounter (Signed)
Refill sent for [facility] to [place]: [phone]   Dose: 100 mg SQ every 4 weeks  Last OV: 10/30/21 (video visit) Provider: Dr. Silas Flood  Next OV: not scheduled but was due in Dec 2023. Routing to scheduling team for appt  Knox Saliva, PharmD, MPH, BCPS Clinical Pharmacist (Rheumatology and Pulmonology)

## 2022-04-29 DIAGNOSIS — M5416 Radiculopathy, lumbar region: Secondary | ICD-10-CM | POA: Diagnosis not present

## 2022-04-29 DIAGNOSIS — M5136 Other intervertebral disc degeneration, lumbar region: Secondary | ICD-10-CM | POA: Diagnosis not present

## 2022-04-29 NOTE — Telephone Encounter (Signed)
Pt sent MyChart message requesting to schedule. Scheduled pt with Dr Silas Flood on 1/8. This is his first opening. Beth (the App pt sees) is also booked until the new year. Pt would like to know if this is okay considering her next injection is 12/29. Please advise.

## 2022-04-29 NOTE — Telephone Encounter (Signed)
Appt with Dr. Silas Flood on 05/25/2021 is appropriate. Thank you.  Knox Saliva, PharmD, MPH, BCPS, CPP Clinical Pharmacist (Rheumatology and Pulmonology)

## 2022-05-01 ENCOUNTER — Other Ambulatory Visit: Payer: Self-pay

## 2022-05-01 ENCOUNTER — Other Ambulatory Visit (HOSPITAL_COMMUNITY): Payer: Self-pay

## 2022-05-04 ENCOUNTER — Telehealth: Payer: Self-pay | Admitting: Family Medicine

## 2022-05-04 DIAGNOSIS — E039 Hypothyroidism, unspecified: Secondary | ICD-10-CM

## 2022-05-04 DIAGNOSIS — E1169 Type 2 diabetes mellitus with other specified complication: Secondary | ICD-10-CM

## 2022-05-04 DIAGNOSIS — F39 Unspecified mood [affective] disorder: Secondary | ICD-10-CM

## 2022-05-05 DIAGNOSIS — M5416 Radiculopathy, lumbar region: Secondary | ICD-10-CM | POA: Diagnosis not present

## 2022-05-05 NOTE — Telephone Encounter (Signed)
Yes, this is fine. Please have her come in fasting. She will need to leave a urine specimen as well.  Orders Placed This Encounter  Procedures   TSH    Standing Status:   Future    Standing Expiration Date:   05/06/2023   T4, free    Standing Status:   Future    Standing Expiration Date:   05/06/2023   Lipid panel    Standing Status:   Future    Standing Expiration Date:   05/06/2023   CMP14+EGFR    Standing Status:   Future    Standing Expiration Date:   05/06/2023   CBC    Standing Status:   Future    Standing Expiration Date:   05/06/2023   Microalbumin / creatinine urine ratio    Standing Status:   Future    Standing Expiration Date:   05/06/2023

## 2022-05-05 NOTE — Telephone Encounter (Signed)
Patient aware and verbalizes understanding. Appointment changed

## 2022-05-06 ENCOUNTER — Other Ambulatory Visit: Payer: Self-pay

## 2022-05-08 ENCOUNTER — Other Ambulatory Visit: Payer: Medicaid Other

## 2022-05-08 DIAGNOSIS — E1169 Type 2 diabetes mellitus with other specified complication: Secondary | ICD-10-CM | POA: Diagnosis not present

## 2022-05-08 DIAGNOSIS — E039 Hypothyroidism, unspecified: Secondary | ICD-10-CM | POA: Diagnosis not present

## 2022-05-08 DIAGNOSIS — F39 Unspecified mood [affective] disorder: Secondary | ICD-10-CM

## 2022-05-08 DIAGNOSIS — E785 Hyperlipidemia, unspecified: Secondary | ICD-10-CM | POA: Diagnosis not present

## 2022-05-08 DIAGNOSIS — E118 Type 2 diabetes mellitus with unspecified complications: Secondary | ICD-10-CM | POA: Diagnosis not present

## 2022-05-09 LAB — CMP14+EGFR
ALT: 10 IU/L (ref 0–32)
AST: 14 IU/L (ref 0–40)
Albumin/Globulin Ratio: 1.6 (ref 1.2–2.2)
Albumin: 4.1 g/dL (ref 3.8–4.9)
Alkaline Phosphatase: 100 IU/L (ref 44–121)
BUN/Creatinine Ratio: 11 (ref 9–23)
BUN: 12 mg/dL (ref 6–24)
Bilirubin Total: 0.3 mg/dL (ref 0.0–1.2)
CO2: 25 mmol/L (ref 20–29)
Calcium: 8.9 mg/dL (ref 8.7–10.2)
Chloride: 103 mmol/L (ref 96–106)
Creatinine, Ser: 1.07 mg/dL — ABNORMAL HIGH (ref 0.57–1.00)
Globulin, Total: 2.6 g/dL (ref 1.5–4.5)
Glucose: 90 mg/dL (ref 70–99)
Potassium: 4 mmol/L (ref 3.5–5.2)
Sodium: 141 mmol/L (ref 134–144)
Total Protein: 6.7 g/dL (ref 6.0–8.5)
eGFR: 62 mL/min/{1.73_m2} (ref >59)

## 2022-05-09 LAB — T4, FREE: Free T4: 1.1 ng/dL (ref 0.82–1.77)

## 2022-05-09 LAB — CBC
Hematocrit: 43.3 % (ref 34.0–46.6)
Hemoglobin: 13.7 g/dL (ref 11.1–15.9)
MCH: 26.9 pg (ref 26.6–33.0)
MCHC: 31.6 g/dL (ref 31.5–35.7)
MCV: 85 fL (ref 79–97)
Platelets: 385 10*3/uL (ref 150–450)
RBC: 5.09 x10E6/uL (ref 3.77–5.28)
RDW: 13.5 % (ref 11.7–15.4)
WBC: 8.5 10*3/uL (ref 3.4–10.8)

## 2022-05-09 LAB — LIPID PANEL
Chol/HDL Ratio: 5.1 ratio — ABNORMAL HIGH (ref 0.0–4.4)
Cholesterol, Total: 148 mg/dL (ref 100–199)
HDL: 29 mg/dL — ABNORMAL LOW (ref >39)
LDL Chol Calc (NIH): 84 mg/dL (ref 0–99)
Triglycerides: 206 mg/dL — ABNORMAL HIGH (ref 0–149)
VLDL Cholesterol Cal: 35 mg/dL (ref 5–40)

## 2022-05-09 LAB — TSH: TSH: 0.762 u[IU]/mL (ref 0.450–4.500)

## 2022-05-12 ENCOUNTER — Telehealth: Payer: Medicaid Other | Admitting: Family Medicine

## 2022-05-12 ENCOUNTER — Encounter: Payer: Self-pay | Admitting: Family Medicine

## 2022-05-12 VITALS — Ht 67.0 in | Wt 291.6 lb

## 2022-05-12 DIAGNOSIS — E039 Hypothyroidism, unspecified: Secondary | ICD-10-CM | POA: Diagnosis not present

## 2022-05-12 DIAGNOSIS — J3489 Other specified disorders of nose and nasal sinuses: Secondary | ICD-10-CM

## 2022-05-12 DIAGNOSIS — E785 Hyperlipidemia, unspecified: Secondary | ICD-10-CM

## 2022-05-12 DIAGNOSIS — R41840 Attention and concentration deficit: Secondary | ICD-10-CM

## 2022-05-12 DIAGNOSIS — M5136 Other intervertebral disc degeneration, lumbar region: Secondary | ICD-10-CM

## 2022-05-12 DIAGNOSIS — M51369 Other intervertebral disc degeneration, lumbar region without mention of lumbar back pain or lower extremity pain: Secondary | ICD-10-CM

## 2022-05-12 DIAGNOSIS — I152 Hypertension secondary to endocrine disorders: Secondary | ICD-10-CM

## 2022-05-12 DIAGNOSIS — E1169 Type 2 diabetes mellitus with other specified complication: Secondary | ICD-10-CM | POA: Diagnosis not present

## 2022-05-12 DIAGNOSIS — E1159 Type 2 diabetes mellitus with other circulatory complications: Secondary | ICD-10-CM

## 2022-05-12 DIAGNOSIS — Z794 Long term (current) use of insulin: Secondary | ICD-10-CM

## 2022-05-12 DIAGNOSIS — M5030 Other cervical disc degeneration, unspecified cervical region: Secondary | ICD-10-CM | POA: Diagnosis not present

## 2022-05-12 MED ORDER — GABAPENTIN 300 MG PO CAPS: ORAL_CAPSULE | ORAL | 3 refills | Status: DC

## 2022-05-12 MED ORDER — PIOGLITAZONE HCL 45 MG PO TABS: 45.0000 mg | ORAL_TABLET | Freq: Every day | ORAL | 3 refills | Status: DC

## 2022-05-12 MED ORDER — BACLOFEN 10 MG PO TABS: ORAL_TABLET | ORAL | 5 refills | Status: DC

## 2022-05-12 MED ORDER — LEVOTHYROXINE SODIUM 125 MCG PO TABS: 125.0000 ug | ORAL_TABLET | Freq: Every day | ORAL | 3 refills | Status: DC

## 2022-05-12 MED ORDER — TRESIBA FLEXTOUCH 200 UNIT/ML ~~LOC~~ SOPN: 50.0000 [IU] | PEN_INJECTOR | Freq: Every day | SUBCUTANEOUS | 3 refills | Status: DC

## 2022-05-12 MED ORDER — ROSUVASTATIN CALCIUM 40 MG PO TABS: 40.0000 mg | ORAL_TABLET | Freq: Every day | ORAL | 3 refills | Status: DC

## 2022-05-12 MED ORDER — AZELASTINE HCL 0.1 % NA SOLN: 1.0000 | Freq: Two times a day (BID) | NASAL | 12 refills | Status: DC

## 2022-05-12 NOTE — Progress Notes (Signed)
MyChart Video visit  Subjective: CC:DM PCP: Janora Norlander, DO HPI:Judith [name] is a 51 y.o. female. Patient provides verbal consent for consult held via video.  Due to COVID-19 pandemic this visit was conducted virtually. This visit type was conducted due to national recommendations for restrictions regarding the COVID-19 Pandemic (e.g. social distancing, sheltering in place) in an effort to limit this patient's exposure and mitigate transmission in our community. All issues noted in this document were discussed and addressed.  A physical exam was not performed with this format.   Location of patient: car Location of provider: [facility] Others present for call: none  1. Type 2 Diabetes with hypertension, hyperlipidemia:  Glucometer:freestyle libre.   BGs have been running good at home, avg 140s. Injecting 50-60u Tyler Aas now (down from 80).  NO lows since reducing [place].  Last eye exam: not due.  Dr Marin Comment Last foot exam: DUE  Last A1c:  Lab Results  Component Value Date   HGBA1C 6.9 (H) 12/29/2021   Nephropathy screen indicated?: DUE Last flu, zoster and/or pneumovax:  Immunization History  Administered Date(s) Administered   Influenza, Seasonal, Injecte, Preservative Fre 02/09/2018   Influenza,inj,Quad PF,6+ Mos 02/14/2017, 02/20/2019   Influenza-Unspecified 02/05/2020, 01/21/2021   MMR 02/03/1972   Moderna Sars-Covid-2 Vaccination 10/02/2019, 11/01/2019, 04/18/2020, 11/26/2020   PNEUMOCOCCAL CONJUGATE-20 11/26/2020   Pneumococcal Polysaccharide-23 02/20/2019, 11/01/2019   Td 03/03/1985, 06/30/1994   Tdap 12/24/2014, 11/26/2020   Zoster Recombinat (Shingrix) 02/05/2020    ROS: Denies chest pain, shortness of breath.   2. Nausea She reports this is due to drainage that occurs over night.  She has used Zofran regularly due to this.  Cannot take Promethazine due to sedation effects.  3.  Mood disorder Continues to see her specialist for anxiety and depression.  She is on  200 mg of Wellbutrin, 200 mg of Zoloft and recently added Valium.  She would like to have a formal evaluation for adult onset ADHD as her psychiatrist does not want to prescribe any type of medication for ADHD except for the Wellbutrin without this type of diagnosis.  Requesting referral today.  She is currently under the care of [facility] who does not do adult evaluations for ADHD  ROS: Per HPI  Allergies  Allergen Reactions   Diphenhydramine Other (See Comments)    Hair feels like it is crawling   Morphine Nausea And Vomiting    PCA PUMP/ DRIP -- N/V IV PUSH IN ER NO PROBLEM PER PT.     Prednisone Nausea And Vomiting   Adhesive [Tape]     Paper tape causes irritation    Erythromycin Hives   Metformin And Related     Diarrhea with IR and XR products   Sulfonamide Derivatives Other (See Comments)    Does not take due to family history   Past Medical History:  Diagnosis Date   Allergy    Anxiety    Arthritis    Asthma    Bipolar 1 disorder ([place])    Blood transfusion without reported diagnosis    with child birth 68 yrs ago    Bronchitis, chronic ([place])    Constipation    COVID-19 virus infection 06/21/2020   DDD (degenerative disc disease), cervical    Depression    Diabetes mellitus without complication ([place])    Dysphagia    Family history of adverse reaction to anesthesia    mother had a hard time waking up after knee surgery in 2021   Gastroparesis  GERD (gastroesophageal reflux disease)    History of kidney stones    History of MRSA infection    Hyperlipemia    Hypothyroid    Migraines    Pneumonia    Sleep apnea    no cpap     Current Outpatient Medications:    Accu-Chek FastClix Lancets MISC, Test blood sugars daily Dx E11.9, Disp: 102 each, Rfl: 3   albuterol (PROVENTIL) (2.5 MG/3ML) 0.083% nebulizer solution, Take 3 mLs (2.5 mg total) by nebulization every 6 (six) hours as needed for wheezing or shortness of breath. Fill upon patient request, Disp: 75 mL,  Rfl: 12   albuterol (VENTOLIN HFA) 108 (90 Base) MCG/ACT inhaler, Inhale 2 puffs into the lungs every 6 (six) hours as needed for wheezing or shortness of breath. Fill upon patient request, Disp: 8 g, Rfl: 1   baclofen (LIORESAL) 10 MG tablet, TAKE 0.5 TO 1 TABLET BY MOUTH 3 TIMES A DAY AS NEEDED FOR MUSCLE SPASM, Disp: 90 tablet, Rfl: 5   Blood Glucose Monitoring Suppl (ACCU-CHEK GUIDE ME) w/Device KIT, Use to check BS BID and as needed. DX: E11.9, Disp: 1 kit, Rfl: 0   budesonide-formoterol (SYMBICORT) 160-4.5 MCG/ACT inhaler, Inhale 2 puffs into the lungs 2 (two) times daily. (Patient taking differently: Inhale 2 puffs into the lungs 2 (two) times daily as needed (shortness of breath).), Disp: 1 each, Rfl: 3   buPROPion ER (WELLBUTRIN SR) 100 MG 12 hr tablet, Take 100 mg by mouth 2 (two) times daily., Disp: , Rfl:    cetirizine (ZYRTEC) 10 MG tablet, Take 10 mg by mouth daily as needed for allergies., Disp: , Rfl:    Continuous Blood Gluc Sensor (FREESTYLE LIBRE 3 SENSOR) MISC, Place 1 sensor on the skin every 14 days. Use to check glucose continuously; DX:E11.65, Disp: 6 each, Rfl: PRN   cycloSPORINE (RESTASIS) 0.05 % ophthalmic emulsion, Place 1 drop into both eyes 2 (two) times daily. (Patient not taking: Reported on 01/05/2022), Disp: , Rfl:    dexlansoprazole (DEXILANT) 60 MG capsule, Take 1 capsule (60 mg total) by mouth daily. Please schedule an appointment for further refills. Thank you, Disp: 30 capsule, Rfl: 1   diclofenac Sodium (VOLTAREN) 1 % GEL, Apply 2 g topically 4 (four) times daily., Disp: 400 g, Rfl: 3   dicyclomine (BENTYL) 20 MG tablet, TAKE 1 TABLET (20 MG TOTAL) BY MOUTH EVERY 6 (SIX) HOURS. (Patient taking differently: Take 20 mg by mouth every 6 (six) hours as needed for spasms (abdominal cramps).), Disp: 360 tablet, Rfl: 0   docusate sodium (COLACE) 100 MG capsule, Take 1 capsule (100 mg total) by mouth 2 (two) times daily. (Patient taking differently: Take 100 mg by mouth 2  (two) times daily as needed for mild constipation.), Disp: 60 capsule, Rfl: 0   gabapentin (NEURONTIN) 300 MG capsule, TAKE 1 CAPSULE BY MOUTH THREE TIMES A DAY (NEEDS TO BE SEEN BEFORE NEXT REFILL), Disp: 90 capsule, Rfl: 0   glucose blood (ACCU-CHEK GUIDE) test strip, Test blood sugars daily Dx E11.9, Disp: 100 strip, Rfl: 3   hydrOXYzine (ATARAX) 10 MG tablet, Take 10 mg by mouth 3 (three) times daily as needed., Disp: , Rfl:    ibuprofen (ADVIL) 600 MG tablet, Take 1 tablet (600 mg total) by mouth at bedtime as needed. (NEEDS TO BE SEEN BEFORE NEXT REFILL), Disp: 30 tablet, Rfl: 0   insulin degludec (TRESIBA FLEXTOUCH) 200 UNIT/ML FlexTouch Pen, Inject 86 Units into the skin at bedtime., Disp: 9 mL,  Rfl: 0   Insulin Pen Needle (B-D UF III MINI PEN NEEDLES) 31G X 5 MM MISC, use as directed once daily Dx E11.8, Disp: 100 each, Rfl: 4   levothyroxine (SYNTHROID) 125 MCG tablet, TAKE 1 TABLET (125 MCG TOTAL) BY MOUTH DAILY. (NEEDS TO BE SEEN BEFORE NEXT REFILL), Disp: 30 tablet, Rfl: 0   linaclotide (LINZESS) 72 MCG capsule, Take 1 capsule (72 mcg total) by mouth daily before breakfast., Disp: 30 capsule, Rfl: 3   medroxyPROGESTERone (DEPO-PROVERA) 150 MG/ML injection, Inject 1 mL (150 mg total) into the muscle every 3 (three) months., Disp: 1 mL, Rfl: 0   Mepolizumab (NUCALA) 100 MG/ML SOAJ, Inject 1 mL (100 mg total) into the skin every 28 (twenty-eight) days., Disp: 1 mL, Rfl: 1   montelukast (SINGULAIR) 10 MG tablet, TAKE 1 TABLET BY MOUTH AT BEDTIME, Disp: 90 tablet, Rfl: 1   nystatin cream (MYCOSTATIN), Apply 1 Application topically 2 (two) times daily. x2 weeks, Disp: 60 g, Rfl: 0   ondansetron (ZOFRAN-ODT) 4 MG disintegrating tablet, TAKE 1 TO 2 TABLETS (4 TO 8 MG TOTAL) BY MOUTH EVERY 8 HOURS AS NEEDED FOR NAUSEA AND VOMITING, Disp: 30 tablet, Rfl: 0   pioglitazone (ACTOS) 45 MG tablet, TAKE 1 TABLET BY MOUTH EVERY DAY, Disp: 90 tablet, Rfl: 3   promethazine (PHENERGAN) 25 MG tablet, Take 1  tablet (25 mg total) by mouth every 6 (six) hours as needed for nausea or vomiting., Disp: 30 tablet, Rfl: 0   rosuvastatin (CRESTOR) 40 MG tablet, Take 1 tablet (40 mg total) by mouth at bedtime., Disp: 90 tablet, Rfl: 3   tirzepatide (MOUNJARO) 12.5 MG/0.5ML Pen, Inject 12.5 mg into the skin once a week., Disp: 6 mL, Rfl: PRN  Current Facility-Administered Medications:    medroxyPROGESTERone (DEPO-PROVERA) injection 150 mg, 150 mg, Intramuscular, Q90 days, Jaece Ducharme M, DO, 150 mg at 04/01/22 1133  Gen: well appearing female. NAD HEENT: teeth missing Pulm: normal work of breathing on room air. No coughing  Recent Results (from the past 2160 hour(s))  CBC     Status: None   Collection Time: 05/08/22  1:33 PM  Result Value Ref Range   WBC 8.5 3.4 - 10.8 x10E3/uL   RBC 5.09 3.77 - 5.28 x10E6/uL   Hemoglobin 13.7 11.1 - 15.9 g/dL   Hematocrit 43.3 34.0 - 46.6 %   MCV 85 79 - 97 fL   MCH 26.9 26.6 - 33.0 pg   MCHC 31.6 31.5 - 35.7 g/dL   RDW 13.5 11.7 - 15.4 %   Platelets 385 150 - 450 x10E3/uL  CMP14+EGFR     Status: Abnormal   Collection Time: 05/08/22  1:33 PM  Result Value Ref Range   Glucose 90 70 - 99 mg/dL   BUN 12 6 - 24 mg/dL   Creatinine, Ser 1.07 (H) 0.57 - 1.00 mg/dL   eGFR 62 >59 mL/min/1.73   BUN/Creatinine Ratio 11 9 - 23   Sodium 141 134 - 144 mmol/L   Potassium 4.0 3.5 - 5.2 mmol/L   Chloride 103 96 - 106 mmol/L   CO2 25 20 - 29 mmol/L   Calcium 8.9 8.7 - 10.2 mg/dL   Total Protein 6.7 6.0 - 8.5 g/dL   Albumin 4.1 3.8 - 4.9 g/dL   Globulin, Total 2.6 1.5 - 4.5 g/dL   Albumin/Globulin Ratio 1.6 1.2 - 2.2   Bilirubin Total 0.3 0.0 - 1.2 mg/dL   Alkaline Phosphatase 100 44 - 121 IU/L   AST 14  0 - 40 IU/L   ALT 10 0 - 32 IU/L  Lipid panel     Status: Abnormal   Collection Time: 05/08/22  1:33 PM  Result Value Ref Range   Cholesterol, Total 148 100 - 199 mg/dL   Triglycerides 206 (H) 0 - 149 mg/dL   HDL 29 (L) >39 mg/dL   VLDL Cholesterol Cal 35 5 -  40 mg/dL   LDL Chol Calc (NIH) 84 0 - 99 mg/dL   Chol/HDL Ratio 5.1 (H) 0.0 - 4.4 ratio    Comment:                                   T. Chol/HDL Ratio                                             Men  Women                               1/2 Avg.Risk  3.4    3.3                                   Avg.Risk  5.0    4.4                                2X Avg.Risk  9.6    7.1                                3X Avg.Risk 23.4   11.0   T4, free     Status: None   Collection Time: 05/08/22  1:33 PM  Result Value Ref Range   Free T4 1.10 0.82 - 1.77 ng/dL  TSH     Status: None   Collection Time: 05/08/22  1:33 PM  Result Value Ref Range   TSH 0.762 0.450 - 4.500 uIU/mL    Assessment/ Plan: 51 y.o. female   Type 2 diabetes mellitus with other specified complication, with long-term current use of insulin ([place]) - Plan: insulin degludec (TRESIBA FLEXTOUCH) 200 UNIT/ML FlexTouch Pen, pioglitazone (ACTOS) 45 MG tablet  Hypertension associated with diabetes ([place])  Hyperlipidemia associated with type 2 diabetes mellitus ([place]) - Plan: rosuvastatin (CRESTOR) 40 MG tablet  Acquired hypothyroidism - Plan: levothyroxine (SYNTHROID) 125 MCG tablet  DDD (degenerative disc disease), cervical - Plan: baclofen (LIORESAL) 10 MG tablet, gabapentin (NEURONTIN) 300 MG capsule  DDD (degenerative disc disease), lumbar - Plan: baclofen (LIORESAL) 10 MG tablet, gabapentin (NEURONTIN) 300 MG capsule  Rhinorrhea - Plan: azelastine (ASTELIN) 0.1 % nasal spray  Attention deficit - Plan: Ambulatory referral to Psychiatry  Unfortunately once he was not previously added so we will tacked that onto last weeks labs.  If cannot be collected she will be glad to come back in for fingerstick.  I adjusted her Tyler Aas to reflect current usage and renewed Actos as well.  Continue current regimen for blood pressure, cholesterol.  Thyroid levels were appropriate to Synthroid renewed  Baclofen and gabapentin renewed for  degenerative changes in the lumbar and C-spine.  Keep  follow-up with Amedeo Kinsman for any type of injections for hand joint  Will trial Astelin for rhinorrhea.  We discussed the risks of chronic use of Zofran and promethazine including tar dive dyskinesia.  Would like to limit use.  If Astelin ineffective, low threshold to send to asthma and allergy for further management since she believes the nausea to be precipitated by copious drainage.  Additionally, requested a referral to psychiatry for adult evaluation for ADHD.  Her psychiatrist does not want to continue any type of ADHD medication without formal diagnosis.  I think this is reasonable and a referral has been placed  Start time: 9:57a End time: 10:13a  Total time spent on patient care (including video visit/ documentation): 16 minutes  [place], [place] [phone]

## 2022-05-13 ENCOUNTER — Encounter: Payer: Self-pay | Admitting: Family Medicine

## 2022-05-14 LAB — SPECIMEN STATUS REPORT

## 2022-05-14 LAB — HGB A1C W/O EAG: Hgb A1c MFr Bld: 6.7 % — ABNORMAL HIGH (ref 4.8–5.6)

## 2022-05-19 ENCOUNTER — Encounter: Payer: Self-pay | Admitting: Pulmonary Disease

## 2022-05-20 ENCOUNTER — Telehealth: Payer: Self-pay | Admitting: Pulmonary Disease

## 2022-05-20 NOTE — Telephone Encounter (Signed)
Noted from clinical staff. Went to go talk to patient in room while she was here with her mother for an follow up and confirmed that she is wanting to keep appt for Monday with Dr Silas Flood. Nothing further needed

## 2022-05-23 ENCOUNTER — Other Ambulatory Visit: Payer: Self-pay | Admitting: Gastroenterology

## 2022-05-24 DIAGNOSIS — Y999 Unspecified external cause status: Secondary | ICD-10-CM | POA: Diagnosis not present

## 2022-05-24 DIAGNOSIS — Y9289 Other specified places as the place of occurrence of the external cause: Secondary | ICD-10-CM | POA: Diagnosis not present

## 2022-05-24 DIAGNOSIS — R410 Disorientation, unspecified: Secondary | ICD-10-CM | POA: Diagnosis not present

## 2022-05-24 DIAGNOSIS — R519 Headache, unspecified: Secondary | ICD-10-CM | POA: Diagnosis not present

## 2022-05-24 DIAGNOSIS — S80212A Abrasion, left knee, initial encounter: Secondary | ICD-10-CM | POA: Diagnosis not present

## 2022-05-24 DIAGNOSIS — S0990XA Unspecified injury of head, initial encounter: Secondary | ICD-10-CM | POA: Diagnosis not present

## 2022-05-24 DIAGNOSIS — W0110XA Fall on same level from slipping, tripping and stumbling with subsequent striking against unspecified object, initial encounter: Secondary | ICD-10-CM | POA: Diagnosis not present

## 2022-05-24 DIAGNOSIS — Y939 Activity, unspecified: Secondary | ICD-10-CM | POA: Diagnosis not present

## 2022-05-24 DIAGNOSIS — M25522 Pain in left elbow: Secondary | ICD-10-CM | POA: Diagnosis not present

## 2022-05-24 DIAGNOSIS — S060X9A Concussion with loss of consciousness of unspecified duration, initial encounter: Secondary | ICD-10-CM | POA: Diagnosis not present

## 2022-05-24 DIAGNOSIS — R296 Repeated falls: Secondary | ICD-10-CM | POA: Diagnosis not present

## 2022-05-24 DIAGNOSIS — E785 Hyperlipidemia, unspecified: Secondary | ICD-10-CM | POA: Diagnosis not present

## 2022-05-24 DIAGNOSIS — H539 Unspecified visual disturbance: Secondary | ICD-10-CM | POA: Diagnosis not present

## 2022-05-25 ENCOUNTER — Other Ambulatory Visit: Payer: Self-pay | Admitting: Family Medicine

## 2022-05-25 ENCOUNTER — Ambulatory Visit: Payer: Medicaid Other | Admitting: Orthopedic Surgery

## 2022-05-25 ENCOUNTER — Ambulatory Visit: Payer: Medicaid Other | Admitting: Pulmonary Disease

## 2022-05-25 ENCOUNTER — Encounter: Payer: Self-pay | Admitting: Orthopedic Surgery

## 2022-05-25 ENCOUNTER — Other Ambulatory Visit: Payer: Self-pay | Admitting: Gastroenterology

## 2022-05-25 DIAGNOSIS — M1812 Unilateral primary osteoarthritis of first carpometacarpal joint, left hand: Secondary | ICD-10-CM | POA: Diagnosis not present

## 2022-05-25 DIAGNOSIS — M180 Bilateral primary osteoarthritis of first carpometacarpal joints: Secondary | ICD-10-CM

## 2022-05-25 DIAGNOSIS — M1811 Unilateral primary osteoarthritis of first carpometacarpal joint, right hand: Secondary | ICD-10-CM | POA: Diagnosis not present

## 2022-05-25 NOTE — Patient Instructions (Signed)

## 2022-05-26 NOTE — Progress Notes (Signed)
Orthopaedic Postop Note  Assessment: Judith [name] is a 52 y.o. female s/p right thumb, index and ring finger trigger release; previously underwent left thumb trigger finger release, and continues to have pain with radiographs demonstrating CMC arthritis (DOS: 09/29/2021)  Bilateral CMC arthritis  Plan: [name] has pain at the base of bilateral thumbs.  She previously had a left CMC joint injection, with excellent relief for several months.  She would like to repeat an injection of the left, and also proceed with an injection on the right.  Risks and benefits were discussed.  She is in agreement with this plan.  She will follow-up as needed.   Procedure note injection Left Thumb    Verbal consent was obtained to inject the left first [facility] joint Timeout was completed to confirm the site of injection.  The skin was prepped with alcohol and ethyl chloride was sprayed at the injection site.  A 21-gauge needle was used to inject 40 mg of Depo-Medrol and 1% lidocaine (1 cc) into the first [facility] joint of the left thumb  There were no complications. A sterile bandage was applied.  Procedure note injection Right Thumb    Verbal consent was obtained to inject the right first [facility] joint Timeout was completed to confirm the site of injection.  The skin was prepped with alcohol and ethyl chloride was sprayed at the injection site.  A 21-gauge needle was used to inject 40 mg of Depo-Medrol and 1% lidocaine (1 cc) into the first [facility] joint of the right thumb  There were no complications. A sterile bandage was applied.  Follow-up: Return if symptoms worsen or fail to improve. XR at next visit: None  Subjective:  Chief Complaint  Patient presents with   Hand Pain    Bilateral thumb pain Would like injections on both     History of Present Illness: Judith [name] is a 52 y.o. female who returns to clinic for repeat evaluation of thumb pain.  She is now having pain in both thumbs.  She previously had  an injection in the left thumb, with excellent response.  She states this improved her symptoms for several months.  She is now having pain again in the left side.  She is also having similar type pain on the right.  She is interested in bilateral injections.  Review of Systems: No fevers or chills No numbness or tingling No Chest Pain No shortness of breath   Objective: There were no vitals taken for this visit.  Physical Exam:   Right hand without deformity.  No swelling.  Pain at the base of the right thumb.  Positive grind test.  Fingers are warm and well-perfused.  Well-healed surgical incisions.  Left thumb with a well-healed surgical incision at the base of the thumb.  No surrounding erythema or drainage.  Pain with CMC grind testing.  Tenderness to palpation at the thumb [facility] joint.   IMAGING: I personally ordered and reviewed the following images:  No new imaging obtained today.   Mordecai Rasmussen, MD 05/26/2022 8:32 AM

## 2022-05-27 ENCOUNTER — Other Ambulatory Visit (HOSPITAL_COMMUNITY): Payer: Self-pay

## 2022-05-28 ENCOUNTER — Encounter: Payer: Self-pay | Admitting: Pulmonary Disease

## 2022-06-04 ENCOUNTER — Encounter: Payer: Self-pay | Admitting: Family Medicine

## 2022-06-04 DIAGNOSIS — M545 Low back pain, unspecified: Secondary | ICD-10-CM | POA: Diagnosis not present

## 2022-06-05 ENCOUNTER — Other Ambulatory Visit (HOSPITAL_COMMUNITY): Payer: Self-pay

## 2022-06-05 NOTE — Telephone Encounter (Signed)
Agree with this plan.  Thank you for taking care of her, [name].

## 2022-06-09 ENCOUNTER — Telehealth: Payer: Medicaid Other

## 2022-06-10 ENCOUNTER — Other Ambulatory Visit: Payer: Self-pay

## 2022-06-10 ENCOUNTER — Other Ambulatory Visit: Payer: Medicaid Other

## 2022-06-10 DIAGNOSIS — Z794 Long term (current) use of insulin: Secondary | ICD-10-CM | POA: Diagnosis not present

## 2022-06-10 DIAGNOSIS — E1169 Type 2 diabetes mellitus with other specified complication: Secondary | ICD-10-CM

## 2022-06-11 ENCOUNTER — Telehealth: Payer: Self-pay | Admitting: Family Medicine

## 2022-06-11 LAB — MICROALBUMIN / CREATININE URINE RATIO
Creatinine, Urine: 256.5 mg/dL
Microalb/Creat Ratio: 6 mg/g creat (ref 0–29)
Microalbumin, Urine: 15.2 ug/mL

## 2022-06-11 NOTE — Telephone Encounter (Signed)
  Prescription Request  06/11/2022  Is this a "Controlled Substance" medicine? no  Have you seen your PCP in the last 2 weeks? No pt last seen 05/12/22 for chronic f/u with DR. Darnell Level.   If YES, route message to pool  -  If NO, patient needs to be scheduled for appointment.  What is the name of the medication or equipment? dexlansoprazole ([place]) 60 MG capsule   Have you contacted your pharmacy to request a refill? yes   Which pharmacy would you like this sent to? [facility] [address]   Patient notified that their request is being sent to the clinical staff for review and that they should receive a response within 2 business days.

## 2022-06-12 MED ORDER — DEXLANSOPRAZOLE 60 MG PO CPDR: 60.0000 mg | DELAYED_RELEASE_CAPSULE | Freq: Every day | ORAL | 0 refills | Status: DC

## 2022-06-12 NOTE — Telephone Encounter (Signed)
This is rx'd by her GI doctor.  I've cc'd them on the request.

## 2022-06-12 NOTE — Telephone Encounter (Signed)
Called and left patient a detailed vm letting her know that we refilled her [place] for 3 months. I also informed her that she has been scheduled for a follow up with Dr. Havery Moros on Monday, 08/24/22 at 10:30 am. I advised that I will send appt information via MyChart and will mail a copy. I advised pt to call back with any questions.

## 2022-06-12 NOTE — Telephone Encounter (Signed)
[place] can you help refill this patient's medication - Dexilant? We can give 3 month refill but otherwise she is due for office follow up in the interim if you can coordinate. Thanks.

## 2022-06-16 ENCOUNTER — Other Ambulatory Visit: Payer: Self-pay | Admitting: Family Medicine

## 2022-06-18 ENCOUNTER — Ambulatory Visit: Payer: Medicaid Other | Admitting: Pulmonary Disease

## 2022-06-18 ENCOUNTER — Ambulatory Visit: Payer: Medicaid Other

## 2022-06-19 ENCOUNTER — Ambulatory Visit: Payer: Medicaid Other

## 2022-06-19 ENCOUNTER — Telehealth: Payer: Self-pay | Admitting: Family Medicine

## 2022-06-19 ENCOUNTER — Ambulatory Visit (INDEPENDENT_AMBULATORY_CARE_PROVIDER_SITE_OTHER): Payer: Medicaid Other

## 2022-06-19 DIAGNOSIS — Z3042 Encounter for surveillance of injectable contraceptive: Secondary | ICD-10-CM

## 2022-06-19 NOTE — Progress Notes (Signed)
Medroxyprogesterone injection given to right upper outer quadrant.  Patient tolerated well. 

## 2022-06-21 ENCOUNTER — Other Ambulatory Visit: Payer: Self-pay | Admitting: Family Medicine

## 2022-06-21 DIAGNOSIS — G8929 Other chronic pain: Secondary | ICD-10-CM

## 2022-06-22 NOTE — Telephone Encounter (Signed)
Pt just wanted to update you about her insulin but nothing urgent.

## 2022-06-23 NOTE — Telephone Encounter (Signed)
Returned call to patient No answer  Left VM

## 2022-06-25 ENCOUNTER — Other Ambulatory Visit (HOSPITAL_COMMUNITY): Payer: Self-pay

## 2022-06-26 ENCOUNTER — Other Ambulatory Visit: Payer: Self-pay | Admitting: Pulmonary Disease

## 2022-06-26 ENCOUNTER — Other Ambulatory Visit (HOSPITAL_COMMUNITY): Payer: Self-pay

## 2022-06-26 DIAGNOSIS — J4540 Moderate persistent asthma, uncomplicated: Secondary | ICD-10-CM

## 2022-06-29 ENCOUNTER — Other Ambulatory Visit: Payer: Self-pay | Admitting: Pulmonary Disease

## 2022-06-29 ENCOUNTER — Other Ambulatory Visit (HOSPITAL_COMMUNITY): Payer: Self-pay

## 2022-06-29 DIAGNOSIS — J4540 Moderate persistent asthma, uncomplicated: Secondary | ICD-10-CM

## 2022-07-01 ENCOUNTER — Other Ambulatory Visit: Payer: Self-pay | Admitting: Pulmonary Disease

## 2022-07-01 ENCOUNTER — Ambulatory Visit
Admission: RE | Admit: 2022-07-01 | Discharge: 2022-07-01 | Disposition: A | Discharge status: Home / Self Care | Payer: Medicaid Other | Source: Ambulatory Visit | Attending: Family Medicine | Admitting: Family Medicine

## 2022-07-01 ENCOUNTER — Other Ambulatory Visit (HOSPITAL_COMMUNITY): Payer: Self-pay

## 2022-07-01 ENCOUNTER — Other Ambulatory Visit: Payer: Self-pay

## 2022-07-01 VITALS — BP 135/85 | HR 90 | Temp 98.1°F | Resp 16

## 2022-07-01 DIAGNOSIS — J0100 Acute maxillary sinusitis, unspecified: Secondary | ICD-10-CM | POA: Diagnosis not present

## 2022-07-01 DIAGNOSIS — J4540 Moderate persistent asthma, uncomplicated: Secondary | ICD-10-CM

## 2022-07-01 DIAGNOSIS — R519 Headache, unspecified: Secondary | ICD-10-CM | POA: Insufficient documentation

## 2022-07-01 DIAGNOSIS — Z1152 Encounter for screening for COVID-19: Secondary | ICD-10-CM | POA: Insufficient documentation

## 2022-07-01 DIAGNOSIS — R6889 Other general symptoms and signs: Secondary | ICD-10-CM

## 2022-07-01 DIAGNOSIS — B9689 Other specified bacterial agents as the cause of diseases classified elsewhere: Secondary | ICD-10-CM | POA: Diagnosis not present

## 2022-07-01 LAB — POCT INFLUENZA A/B
Influenza A, POC: NEGATIVE
Influenza B, POC: NEGATIVE

## 2022-07-01 MED ORDER — NUCALA 100 MG/ML ~~LOC~~ SOAJ
100.0000 mg | SUBCUTANEOUS | 1 refills | Status: DC
  Filled 2022-07-01: qty 1, 28d supply, fill #0

## 2022-07-01 MED ORDER — DOXYCYCLINE HYCLATE 100 MG PO CAPS: 100.0000 mg | ORAL_CAPSULE | Freq: Two times a day (BID) | ORAL | 0 refills | Status: AC

## 2022-07-01 NOTE — Telephone Encounter (Signed)
Refill sent for [facility] to [place]: [phone]   Dose: 100 mg SQ every 4 weeks  Last OV: 10/30/2021 -  video visit Provider: Dr. Silas Flood  Next OV: 07/03/2022  Knox Saliva, PharmD, MPH, BCPS Clinical Pharmacist (Rheumatology and Pulmonology)

## 2022-07-01 NOTE — Discharge Instructions (Addendum)
Take medication until course is finished even if you begin to feel better. Take medication with food to decrease stomach upset. Increase fluid intake to at least 64 oz. daily. Return to clinic or follow-up with PCP for symptoms that do not improve or worsen.

## 2022-07-01 NOTE — ED Provider Notes (Signed)
[facility]    CSN: [id number] Arrival date & time: 07/01/22  1349      History   Chief Complaint Chief Complaint  Patient presents with   Headache   Generalized Body Aches   Otalgia    HPI Judith [name] is a 52 y.o. female.   HPI pleasant 52 y/o female presents for evaluation of headache with facial pain/pressure x1 week. Pt reports sinus drainage and right ear pain. Pt states that her left ear feels full and Judith [name] has been experiencing diminished hearing. Pt reports body aches.   Past Medical History:  Diagnosis Date   Allergy    Anxiety    Arthritis    Asthma    Bipolar 1 disorder ([place])    Blood transfusion without reported diagnosis    with child birth 73 yrs ago    Bronchitis, chronic ([place])    Constipation    COVID-19 virus infection 06/21/2020   DDD (degenerative disc disease), cervical    Depression    Diabetes mellitus without complication ([place])    Dysphagia    Family history of adverse reaction to anesthesia    mother had a hard time waking up after knee surgery in 2021   Gastroparesis    GERD (gastroesophageal reflux disease)    History of kidney stones    History of MRSA infection    Hyperlipemia    Hypothyroid    Migraines    Pneumonia    Sleep apnea    no cpap     Patient Active Problem List   Diagnosis Date Noted   Esophagogastric junction outflow obstruction    Gastric polyp    Cervical myelopathy ([place]) 10/09/2020   Mild intermittent asthma with acute exacerbation 10/04/2020   Dysphagia    Patellofemoral pain syndrome of both knees 04/19/2020   Tinnitus of right ear 03/15/2020   Closed fracture of one rib of left side 03/15/2020   Concussion with no loss of consciousness 02/05/2020   Spondylosis of cervical spine 02/05/2020   Acute bilateral thoracic back pain 02/05/2020   Acute bilateral low back pain without sciatica 02/05/2020   Gastroparesis 11/03/2019   Hospital discharge follow-up 11/03/2019   DDD (degenerative disc  disease), lumbar 06/30/2019   Lumbar back pain with radiculopathy affecting lower extremity 06/20/2019   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/21/2019   History of left knee replacement 02/10/2018   Morbid obesity ([place]) 11/21/2017   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   Episodic mood disorder ([place]) 05/12/2016   Controlled diabetes mellitus type 2 with complications ([place]) [id number]   Gastroesophageal reflux disease without esophagitis 05/12/2016   DYSPNEA 07/22/2010   CHEST PAIN 07/22/2010   Hypothyroidism 09/13/2007   Hyperlipidemia 09/13/2007   Depression, major, single episode, moderate ([place]) 09/13/2007   MIGRAINE HEADACHE 09/13/2007   Essential hypertension 09/13/2007   Asthma 09/13/2007   CHOLELITHIASIS, HX OF 09/13/2007   NEPHROLITHIASIS, HX OF 09/13/2007   CARPAL TUNNEL RELEASE, HX OF 09/13/2007    Past Surgical History:  Procedure Laterality Date   ABDOMINAL SURGERY     ANTERIOR CERVICAL DECOMP/DISCECTOMY FUSION N/A 10/09/2020   Procedure: Anterior Cervical Discectomy Fusion Cervical five-six;  Surgeon: Vallarie Mare, MD;  Location: [facility];  Service: Neurosurgery;  Laterality: N/A;   BIOPSY  02/04/2021   Procedure: BIOPSY;  Surgeon: Yetta Flock, MD;  Location: [facility];  Service: Gastroenterology;;   BOTOX INJECTION N/A 02/04/2021   Procedure: BOTOX INJECTION;  Surgeon: Yetta Flock,  MD;  Location: [facility];  Service: Gastroenterology;  Laterality: N/A;   CARPAL TUNNEL RELEASE Bilateral 1999   CESAREAN SECTION     CHOLECYSTECTOMY     COLONOSCOPY     ESOPHAGEAL MANOMETRY N/A 07/08/2020   Procedure: ESOPHAGEAL MANOMETRY (EM);  Surgeon: Yetta Flock, MD;  Location: [facility];  Service: Gastroenterology;  Laterality: N/A;   ESOPHAGOGASTRODUODENOSCOPY (EGD) WITH PROPOFOL N/A 02/04/2021   Procedure: ESOPHAGOGASTRODUODENOSCOPY (EGD) WITH PROPOFOL;  Surgeon: Yetta Flock, MD;  [facility]: [facility];   Service: Gastroenterology;  Laterality: N/A;   REPLACEMENT TOTAL KNEE Bilateral    SCLEROTHERAPY  02/04/2021   Procedure: SCLEROTHERAPY;  Surgeon: Yetta Flock, MD;  [facility];  Service: Gastroenterology;;   THYROIDECTOMY     TONSILLECTOMY     TONSILLECTOMY AND ADENOIDECTOMY     TRIGGER FINGER RELEASE Left 06/16/2021   Procedure: LEFT THUMB TRIGGER FINGER RELEASE;  Surgeon: Mordecai Rasmussen, MD;  Location: [facility];  Service: Orthopedics;  Laterality: Left;   TRIGGER FINGER RELEASE Right 09/29/2021   Procedure: RELEASE TRIGGER FINGER/A-1 PULLEY;  Surgeon: Mordecai Rasmussen, MD;  Location: [facility];  Service: Orthopedics;  Laterality: Right;  Right ring, index and thumb trigger finger   UPPER GASTROINTESTINAL ENDOSCOPY      OB History     Gravida  1   Para  1   Term  1   Preterm      AB      Living         SAB      IAB      Ectopic      Multiple      Live Births               Home Medications    Prior to Admission medications   Medication Sig Start Date End Date Taking? Authorizing Provider  doxycycline (VIBRAMYCIN) 100 MG capsule Take 1 capsule (100 mg total) by mouth 2 (two) times daily for 7 days. 07/01/22 07/08/22 Yes Eliezer Lofts, FNP  methocarbamol (ROBAXIN) 500 MG tablet 500 mg. 06/04/22  Yes [provider]  naproxen (NAPROSYN) 500 MG tablet 500 mg. 06/08/22  Yes [provider]  Accu-Chek FastClix Lancets MISC Test blood sugars daily Dx E11.9 11/27/21   Ronnie Doss M, DO  albuterol (PROVENTIL) (2.5 MG/3ML) 0.083% nebulizer solution Take 3 mLs (2.5 mg total) by nebulization every 6 (six) hours as needed for wheezing or shortness of breath. Fill upon patient request 09/26/20   Martyn Ehrich, NP  albuterol (VENTOLIN HFA) 108 (90 Base) MCG/ACT inhaler Inhale 2 puffs into the lungs every 6 (six) hours as needed for wheezing or shortness of breath. Fill upon patient request 09/26/20   Martyn Ehrich, NP  azelastine  (ASTELIN) 0.1 % nasal spray Place 1 spray into both nostrils 2 (two) times daily. 05/12/22   Janora Norlander, DO  Blood Glucose Monitoring Suppl (ACCU-CHEK GUIDE ME) w/Device KIT Use to check BS BID and as needed. DX: E11.9 01/26/22   Ronnie Doss M, DO  budesonide-formoterol (SYMBICORT) 160-4.5 MCG/ACT inhaler Inhale 2 puffs into the lungs 2 (two) times daily. Patient taking differently: Inhale 2 puffs into the lungs 2 (two) times daily as needed (shortness of breath). 09/26/20   Martyn Ehrich, NP  cetirizine (ZYRTEC) 10 MG tablet Take 10 mg by mouth daily as needed for allergies.    [provider]  Continuous Blood Gluc Sensor (FREESTYLE LIBRE 3 SENSOR) MISC Place 1 sensor on  the skin every 14 days. Use to check glucose continuously; DX:E11.65 03/24/22   Ronnie Doss M, DO  dexlansoprazole (DEXILANT) 60 MG capsule Take 1 capsule (60 mg total) by mouth daily. 06/12/22   Armbruster, Carlota Raspberry, MD  diclofenac Sodium (VOLTAREN) 1 % GEL APPLY 2 GRAMS TO AFFECTED AREA 4 TIMES A DAY 06/22/22   Gwenlyn Perking, FNP  dicyclomine (BENTYL) 20 MG tablet TAKE 1 TABLET (20 MG TOTAL) BY MOUTH EVERY 6 (SIX) HOURS. Patient taking differently: Take 20 mg by mouth every 6 (six) hours as needed for spasms (abdominal cramps). 08/07/20   Janora Norlander, DO  gabapentin (NEURONTIN) 300 MG capsule TAKE 1 CAPSULE BY MOUTH THREE TIMES A DAY 05/12/22   Ronnie Doss M, DO  glucose blood (ACCU-CHEK GUIDE) test strip Test blood sugars daily Dx E11.9 11/27/21   Janora Norlander, DO  hydrOXYzine (ATARAX) 10 MG tablet Take 10 mg by mouth 3 (three) times daily as needed.    [provider]  ibuprofen (ADVIL) 600 MG tablet TAKE 1 TABLET (600 MG TOTAL) BY MOUTH AT BEDTIME AS NEEDED. (NEEDS TO BE SEEN BEFORE NEXT REFILL) 05/26/22   Ronnie Doss M, DO  insulin degludec (TRESIBA FLEXTOUCH) 200 UNIT/ML FlexTouch Pen Inject 50-60 Units into the skin at bedtime. 05/12/22   Janora Norlander, DO   Insulin Pen Needle (B-D UF III MINI PEN NEEDLES) 31G X 5 MM MISC use as directed once daily Dx E11.8 02/10/22   Janora Norlander, DO  levothyroxine (SYNTHROID) 125 MCG tablet Take 1 tablet (125 mcg total) by mouth daily. 05/12/22   Janora Norlander, DO  linaclotide (LINZESS) 72 MCG capsule Take 1 capsule (72 mcg total) by mouth daily before breakfast. 02/10/22   Ronnie Doss M, DO  medroxyPROGESTERone (DEPO-PROVERA) 150 MG/ML injection INJECT 1 ML (150 MG TOTAL) INTO THE MUSCLE EVERY 3 (THREE) MONTHS 06/16/22   Ronnie Doss M, DO  Mepolizumab (NUCALA) 100 MG/ML SOAJ Inject 1 mL (100 mg total) into the skin every 28 (twenty-eight) days. 07/01/22   Hunsucker, Bonna Gains, MD  montelukast (SINGULAIR) 10 MG tablet TAKE 1 TABLET BY MOUTH AT BEDTIME 04/14/22   Ronnie Doss M, DO  nystatin cream (MYCOSTATIN) Apply 1 Application topically 2 (two) times daily. x2 weeks 12/26/21   Ronnie Doss M, DO  pioglitazone (ACTOS) 45 MG tablet Take 1 tablet (45 mg total) by mouth daily. 05/12/22   Janora Norlander, DO  promethazine (PHENERGAN) 25 MG tablet Take 1 tablet (25 mg total) by mouth every 6 (six) hours as needed for nausea or vomiting. 06/11/20   Rosemarie Ax, MD  rosuvastatin (CRESTOR) 40 MG tablet Take 1 tablet (40 mg total) by mouth at bedtime. 05/12/22   Janora Norlander, DO  sertraline (ZOLOFT) 100 MG tablet Take 200 mg by mouth daily.    [provider]  tirzepatide Darcel Bayley) 12.5 MG/0.5ML Pen Inject 12.5 mg into the skin once a week. 03/24/22   Ronnie Doss M, DO  VALIUM 10 MG tablet Take 10 mg by mouth every 8 (eight) hours as needed for anxiety. Rx by [facility] 01/28/22   [provider]    Family History Family History  Problem Relation Age of Onset   Mental illness Other    Diabetes Other    Healthy Mother    Cancer Father    Non-Hodgkin's lymphoma Father    Breast cancer Paternal Grandmother    Lung cancer Paternal Grandmother     Esophageal cancer Other  Colon cancer Neg Hx    Stomach cancer Neg Hx    Pancreatic cancer Neg Hx    Colon polyps Neg Hx     Social History Social History   Tobacco Use   Smoking status: Former    Packs/day: 1.00    Years: 5.00    Total pack years: 5.00    Types: Cigarettes    Quit date: 04/04/1993    Years since quitting: 29.2   Smokeless tobacco: Never   Tobacco comments:    smoked when went out with friends/social smoker. pack would last a week.  Vaping Use   Vaping Use: Former  Substance Use Topics   Alcohol use: No   Drug use: No     Allergies   Diphenhydramine, Morphine, Prednisone, Adhesive [tape], Erythromycin, Metformin and related, and Sulfonamide derivatives   Review of Systems Review of Systems  HENT:  Positive for ear pain, postnasal drip, sinus pressure and sinus pain.   Musculoskeletal:  Positive for arthralgias, myalgias and neck pain.  All other systems reviewed and are negative.    Physical Exam Triage Vital Signs ED Triage Vitals  Enc Vitals Group     BP 07/01/22 1413 135/85     Pulse Rate 07/01/22 1413 90     Resp 07/01/22 1413 16     Temp 07/01/22 1413 98.1 F (36.7 C)     Temp src --      SpO2 07/01/22 1413 94 %     Weight --      Height --      Head Circumference --      Peak Flow --      Pain Score 07/01/22 1408 7     Pain Loc --      Pain Edu? --      Excl. in [place]? --    No data found.  Updated Vital Signs BP 135/85   Pulse 90   Temp 98.1 F (36.7 C)   Resp 16   SpO2 94%       Physical Exam Vitals and nursing note reviewed.  Constitutional:      Appearance: Normal appearance. Judith [name] is obese. Judith [name] is ill-appearing.  HENT:     Head: Normocephalic and atraumatic.     Right Ear: Tympanic membrane, ear canal and external ear normal.     Left Ear: Ear canal and external ear normal.     Ears:     Comments: Left TM erythematous     Nose:     Right Sinus: Maxillary sinus tenderness present.     Left Sinus: Maxillary  sinus tenderness present.     Mouth/Throat:     Mouth: Mucous membranes are moist.     Pharynx: Oropharynx is clear.  Eyes:     Extraocular Movements: Extraocular movements intact.     Conjunctiva/sclera: Conjunctivae normal.     Pupils: Pupils are equal, round, and reactive to light.  Cardiovascular:     Rate and Rhythm: Normal rate and regular rhythm.     Pulses: Normal pulses.     Heart sounds: Normal heart sounds. No murmur heard. Pulmonary:     Effort: Pulmonary effort is normal.     Breath sounds: Normal breath sounds.  Musculoskeletal:        General: Normal range of motion.     Cervical back: Normal range of motion and neck supple.  Skin:    General: Skin is warm and dry.  Neurological:     General: No  focal deficit present.     Mental Status: Judith [name] is alert and oriented to person, place, and time.      UC Treatments / Results  Labs (all labs ordered are listed, but only abnormal results are displayed) Labs Reviewed  SARS CORONAVIRUS 2 (TAT 6-24 HRS)  POCT INFLUENZA A/B    EKG   Radiology No results found.  Procedures Procedures (including critical care time)  Medications Ordered in UC Medications - No data to display  Initial Impression / Assessment and Plan / UC Course  I have reviewed the triage vital signs and the nursing notes.  Pertinent labs & imaging results that were available during my care of the patient were reviewed by me and considered in my medical decision making (see chart for details).    MDM: 1.  Acute maxillary sinusitis, recurrence not specified-Rx'd Doxycycline. Take medication until course is finished even if you begin to feel better. Take medication with food to decrease stomach upset. Increase fluid intake to at least 64 oz. daily. Return to clinic or follow-up with PCP for symptoms that do not improve or worsen.  Final Clinical Impressions(s) / UC Diagnoses   Final diagnoses:  Flu-like symptoms  Acute maxillary sinusitis,  recurrence not specified     Discharge Instructions      Take medication until course is finished even if you begin to feel better. Take medication with food to decrease stomach upset. Increase fluid intake to at least 64 oz. daily. Return to clinic or follow-up with PCP for symptoms that do not improve or worsen.     ED Prescriptions     Medication Sig Dispense Auth. Provider   doxycycline (VIBRAMYCIN) 100 MG capsule Take 1 capsule (100 mg total) by mouth 2 (two) times daily for 7 days. 14 capsule Eliezer Lofts, FNP      PDMP not reviewed this encounter.   Eliezer Lofts, [place] 07/01/22 1614

## 2022-07-01 NOTE — ED Triage Notes (Signed)
Pt presents to uc with co of ha in the back of her head and facial pain and otalgia for  1week. She also co of neck pain and generalized body aches and pain. Pt reports tylenol for pain with no improvement

## 2022-07-03 ENCOUNTER — Encounter: Payer: Self-pay | Admitting: Pulmonary Disease

## 2022-07-03 ENCOUNTER — Other Ambulatory Visit: Payer: Self-pay

## 2022-07-03 ENCOUNTER — Other Ambulatory Visit (HOSPITAL_COMMUNITY): Payer: Self-pay

## 2022-07-03 ENCOUNTER — Ambulatory Visit: Payer: Medicaid Other | Admitting: Pulmonary Disease

## 2022-07-03 DIAGNOSIS — J4540 Moderate persistent asthma, uncomplicated: Secondary | ICD-10-CM

## 2022-07-03 LAB — SARS CORONAVIRUS 2 (TAT 6-24 HRS): SARS Coronavirus 2: NEGATIVE

## 2022-07-03 MED ORDER — NUCALA 100 MG/ML ~~LOC~~ SOAJ
100.0000 mg | SUBCUTANEOUS | 1 refills | Status: DC
  Filled 2022-07-03: qty 1, 28d supply, fill #0
  Filled 2022-07-29: qty 1, 28d supply, fill #1
  Filled 2022-08-27: qty 1, 28d supply, fill #2
  Filled 2022-09-25: qty 1, 28d supply, fill #3
  Filled 2022-10-21 (×2): qty 1, 28d supply, fill #4
  Filled 2022-11-27: qty 1, 28d supply, fill #5

## 2022-07-03 NOTE — Progress Notes (Signed)
$@PatientB$  ID: Judith [name], female    DOB: 08/12/1970, 52 y.o.   MRN: [id number]  Chief Complaint  Patient presents with   Follow-up    Referring provider: Janora Norlander, DO  HPI:   52 y.o. woman with history of poorly controlled asthma now with much better control on Nucala whom we are seeing for routine follow-up.  Most recent PCP note reviewed.  Doing well.  No issues.  Not really using inhalers.  No rest inhaler use months.  Notes recent sinusitis, URI.  Also with COVID back in the fall.  No breathing issues with these illnesses.  Very pleased with her control.   HPI at initial visit:  Patient had asthma for very long time.  She is had recurrent bouts of bronchitis throughout her life.  These are characterized by increased cough, shortness of breath.  Usually cough is productive of sputum.  In general, she thinks her asthma is well controlled on high-dose Symbicort and montelukast.  However she does have these intermittent episodes of uncontrolled coughing and shortness of breath.  Usually there adequately treated with steroid injection.  She notes that she cannot tolerate oral prednisone due to nausea and vomiting.  She is needing a couple of steroid injections over the last couple of months.  She thinks the frequency of these bouts are a bit worse over the last 6 months to 1 year.  No clear trigger.  Does not seem to be exacerbated or set off by viral infection.  Denies significant seasonal allergies to suggest seasonal change.  Review of prior images demonstrates relatively newly discovered small hiatal hernia.  She denies heartburn but does take PPI for this.  Review of labs indicate elevated eosinophils of 300 in the past.  Most recent chest x-ray 01/22/2020 personally reviewed interpreted as clear lungs.  PMH: Asthma, diabetes, hyperlipidemia, migraines, sleep apnea Surgical history: Cholecystectomy in 2008 Family history: Allergies, asthma, heart disease run in her  family Social history: Former smoker quit in 1994, 5-pack-year smoking history, works at [employer] on [address] at the old [place].   Questionaires / Pulmonary Flowsheets:   ACT:  Asthma Control Test ACT Total Score  09/26/2020 11:02 AM 10  04/04/2020 11:22 AM 9    MMRC:     No data to display          Epworth:      No data to display          Tests:   FENO:  No results found for: "NITRICOXIDE"  PFT:    Latest Ref Rng & Units 09/26/2020    9:52 AM  PFT Results  FVC-Pre L 2.75   FVC-Predicted Pre % 67   FVC-Post L 2.60   FVC-Predicted Post % 64   Pre FEV1/FVC % % 87   Post FEV1/FCV % % 89   FEV1-Pre L 2.40   FEV1-Predicted Pre % 75   FEV1-Post L 2.33   DLCO uncorrected ml/min/mmHg 21.53   DLCO UNC% % 89   DLCO corrected ml/min/mmHg 21.53   DLCO COR %Predicted % 89   DLVA Predicted % 127   TLC L 3.84   TLC % Predicted % 67   RV % Predicted % 59    WALK:      No data to display          Imaging: No results found.   Lab Results: Personally reviewed and as discussed in this note CBC    Component Value  Date/Time   WBC 8.5 05/08/2022 1333   WBC 8.8 06/13/2021 1021   RBC 5.09 05/08/2022 1333   RBC 4.94 06/13/2021 1021   HGB 13.7 05/08/2022 1333   HCT 43.3 05/08/2022 1333   PLT 385 05/08/2022 1333   MCV 85 05/08/2022 1333   MCH 26.9 05/08/2022 1333   MCH 26.5 06/13/2021 1021   MCHC 31.6 05/08/2022 1333   MCHC 31.3 06/13/2021 1021   RDW 13.5 05/08/2022 1333   LYMPHSABS 2.5 01/22/2021 1315   MONOABS 0.7 04/04/2020 1155   EOSABS 0.0 01/22/2021 1315   BASOSABS 0.0 01/22/2021 1315    BMET    Component Value Date/Time   NA 141 05/08/2022 1333   K 4.0 05/08/2022 1333   CL 103 05/08/2022 1333   CO2 25 05/08/2022 1333   GLUCOSE 90 05/08/2022 1333   GLUCOSE 263 (H) 06/13/2021 1021   BUN 12 05/08/2022 1333   CREATININE 1.07 (H) 05/08/2022 1333   CALCIUM 8.9 05/08/2022 1333   GFRNONAA >60 06/13/2021 1021   GFRAA 94  07/04/2020 0846    BNP No results found for: "BNP"  ProBNP    Component Value Date/Time   PROBNP 11.8 07/28/2010 1115    Specialty Problems       Pulmonary Problems   Asthma    Qualifier: Diagnosis of  By: Marland Mcalpine        DYSPNEA    Qualifier: Diagnosis of  By: Percival Spanish, MD, Farrel Gordon        Mild intermittent asthma with acute exacerbation    Allergies  Allergen Reactions   Diphenhydramine Other (See Comments)    Hair feels like it is crawling   Morphine Nausea And Vomiting    PCA PUMP/ DRIP -- N/V IV PUSH IN ER NO PROBLEM PER PT.     Prednisone Nausea And Vomiting   Adhesive [Tape]     Paper tape causes irritation    Erythromycin Hives   Metformin And Related     Diarrhea with IR and XR products   Sulfonamide Derivatives Other (See Comments)    Does not take due to family history    Immunization History  Administered Date(s) Administered   Influenza, Seasonal, Injecte, Preservative Fre 02/09/2018   Influenza,inj,Quad PF,6+ Mos 02/14/2017, 02/20/2019   Influenza-Unspecified 02/05/2020, 01/21/2021   MMR 02/03/1972   Moderna Sars-Covid-2 Vaccination 10/02/2019, 11/01/2019, 04/18/2020, 11/26/2020   PNEUMOCOCCAL CONJUGATE-20 11/26/2020   Pneumococcal Polysaccharide-23 02/20/2019, 11/01/2019   Td 03/03/1985, 06/30/1994   Tdap 12/24/2014, 11/26/2020   Zoster Recombinat (Shingrix) 02/05/2020    Past Medical History:  Diagnosis Date   Allergy    Anxiety    Arthritis    Asthma    Bipolar 1 disorder ([place])    Blood transfusion without reported diagnosis    with child birth 1 yrs ago    Bronchitis, chronic ([place])    Constipation    COVID-19 virus infection 06/21/2020   DDD (degenerative disc disease), cervical    Depression    Diabetes mellitus without complication ([place])    Dysphagia    Family history of adverse reaction to anesthesia    mother had a hard time waking up after knee surgery in 2021   Gastroparesis    GERD  (gastroesophageal reflux disease)    History of kidney stones    History of MRSA infection    Hyperlipemia    Hypothyroid    Migraines    Pneumonia    Sleep apnea    no cpap  Tobacco History: Social History   Tobacco Use  Smoking Status Former   Packs/day: 1.00   Years: 5.00   Total pack years: 5.00   Types: Cigarettes   Quit date: 04/04/1993   Years since quitting: 29.2  Smokeless Tobacco Never  Tobacco Comments   smoked when went out with friends/social smoker. pack would last a week.   Counseling given: Not Answered Tobacco comments: smoked when went out with friends/social smoker. pack would last a week.   Continue to not smoke  Outpatient Encounter Medications as of 07/03/2022  Medication Sig   Accu-Chek FastClix Lancets MISC Test blood sugars daily Dx E11.9   albuterol (PROVENTIL) (2.5 MG/3ML) 0.083% nebulizer solution Take 3 mLs (2.5 mg total) by nebulization every 6 (six) hours as needed for wheezing or shortness of breath. Fill upon patient request   albuterol (VENTOLIN HFA) 108 (90 Base) MCG/ACT inhaler Inhale 2 puffs into the lungs every 6 (six) hours as needed for wheezing or shortness of breath. Fill upon patient request   azelastine (ASTELIN) 0.1 % nasal spray Place 1 spray into both nostrils 2 (two) times daily.   Blood Glucose Monitoring Suppl (ACCU-CHEK GUIDE ME) w/Device KIT Use to check BS BID and as needed. DX: E11.9   budesonide-formoterol (SYMBICORT) 160-4.5 MCG/ACT inhaler Inhale 2 puffs into the lungs 2 (two) times daily. (Patient taking differently: Inhale 2 puffs into the lungs 2 (two) times daily as needed (shortness of breath).)   cetirizine (ZYRTEC) 10 MG tablet Take 10 mg by mouth daily as needed for allergies.   Continuous Blood Gluc Sensor (FREESTYLE LIBRE 3 SENSOR) MISC Place 1 sensor on the skin every 14 days. Use to check glucose continuously; DX:E11.65   dexlansoprazole (DEXILANT) 60 MG capsule Take 1 capsule (60 mg total) by mouth  daily.   diclofenac Sodium (VOLTAREN) 1 % GEL APPLY 2 GRAMS TO AFFECTED AREA 4 TIMES A DAY   dicyclomine (BENTYL) 20 MG tablet TAKE 1 TABLET (20 MG TOTAL) BY MOUTH EVERY 6 (SIX) HOURS. (Patient taking differently: Take 20 mg by mouth every 6 (six) hours as needed for spasms (abdominal cramps).)   doxycycline (VIBRAMYCIN) 100 MG capsule Take 1 capsule (100 mg total) by mouth 2 (two) times daily for 7 days.   gabapentin (NEURONTIN) 300 MG capsule TAKE 1 CAPSULE BY MOUTH THREE TIMES A DAY   glucose blood (ACCU-CHEK GUIDE) test strip Test blood sugars daily Dx E11.9   hydrOXYzine (ATARAX) 10 MG tablet Take 10 mg by mouth 3 (three) times daily as needed.   ibuprofen (ADVIL) 600 MG tablet TAKE 1 TABLET (600 MG TOTAL) BY MOUTH AT BEDTIME AS NEEDED. (NEEDS TO BE SEEN BEFORE NEXT REFILL)   insulin degludec (TRESIBA FLEXTOUCH) 200 UNIT/ML FlexTouch Pen Inject 50-60 Units into the skin at bedtime.   Insulin Pen Needle (B-D UF III MINI PEN NEEDLES) 31G X 5 MM MISC use as directed once daily Dx E11.8   levothyroxine (SYNTHROID) 125 MCG tablet Take 1 tablet (125 mcg total) by mouth daily.   linaclotide (LINZESS) 72 MCG capsule Take 1 capsule (72 mcg total) by mouth daily before breakfast.   medroxyPROGESTERone (DEPO-PROVERA) 150 MG/ML injection INJECT 1 ML (150 MG TOTAL) INTO THE MUSCLE EVERY 3 (THREE) MONTHS   methocarbamol (ROBAXIN) 500 MG tablet 500 mg.   montelukast (SINGULAIR) 10 MG tablet TAKE 1 TABLET BY MOUTH AT BEDTIME   naproxen (NAPROSYN) 500 MG tablet 500 mg.   nystatin cream (MYCOSTATIN) Apply 1 Application topically 2 (two) times daily.  x2 weeks   pioglitazone (ACTOS) 45 MG tablet Take 1 tablet (45 mg total) by mouth daily.   promethazine (PHENERGAN) 25 MG tablet Take 1 tablet (25 mg total) by mouth every 6 (six) hours as needed for nausea or vomiting.   rosuvastatin (CRESTOR) 40 MG tablet Take 1 tablet (40 mg total) by mouth at bedtime.   sertraline (ZOLOFT) 100 MG tablet Take 200 mg by mouth  daily.   tirzepatide (MOUNJARO) 12.5 MG/0.5ML Pen Inject 12.5 mg into the skin once a week.   VALIUM 10 MG tablet Take 10 mg by mouth every 8 (eight) hours as needed for anxiety. Rx by [facility]   [DISCONTINUED] Mepolizumab (NUCALA) 100 MG/ML SOAJ Inject 1 mL (100 mg total) into the skin every 28 (twenty-eight) days.   Mepolizumab (NUCALA) 100 MG/ML SOAJ Inject 1 mL (100 mg total) into the skin every 28 (twenty-eight) days.   Facility-Administered Encounter Medications as of 07/03/2022  Medication   medroxyPROGESTERone (DEPO-PROVERA) injection 150 mg     Review of Systems  Review of Systems  N/a  Physical Exam  BP 128/86 (BP Location: Right Arm, Cuff Size: Normal)   Pulse 85   Ht 5' 7"$  (1.702 m)   Wt 289 lb 9.6 oz (131.4 kg)   SpO2 97%   BMI 45.36 kg/m   Wt Readings from Last 5 Encounters:  07/03/22 289 lb 9.6 oz (131.4 kg)  05/12/22 291 lb 9.6 oz (132.3 kg)  02/05/22 (!) 309 lb (140.2 kg)  01/07/22 (!) 311 lb (141.1 kg)  11/27/21 (!) 319 lb (144.7 kg)    BMI Readings from Last 5 Encounters:  07/03/22 45.36 kg/m  05/12/22 45.67 kg/m  02/05/22 46.98 kg/m  01/07/22 47.29 kg/m  11/27/21 48.50 kg/m     Physical Exam General: Sitting up in exam chair, no acute distress Eyes: EOMI, no icterus Neck: Supple, no JVP, difficult habitus Respiratory: Clear auscultation bilaterally, distant breath sounds Cardiovascular: Regular rhythm, no murmurs Abdomen: Nondistended, bowel sounds present MSK: No synovitis, joint effusion Neuro: Normal gait, no weakness Psych: Normal mood, full affect   Assessment & Plan:   Asthma: Previously persistent severe with multiple exacerbations a year.  Started Nucala 10/2020. One exacerbation since.  None in over a year.  Had 6-7 exacerbations in year preceding starting Nucala. Symptoms of cough and dyspnea improved.  To continue q. monthly Nucala shots, refill today.  To continue ICS/LABA therapy with Symbicort.  She reports no rescue  inhaler use.    Return in about 6 months (around 01/01/2023).   Lanier Clam, MD 07/03/2022

## 2022-07-09 ENCOUNTER — Other Ambulatory Visit: Payer: Self-pay | Admitting: Family Medicine

## 2022-07-09 ENCOUNTER — Other Ambulatory Visit: Payer: Self-pay | Admitting: Gastroenterology

## 2022-07-09 DIAGNOSIS — B372 Candidiasis of skin and nail: Secondary | ICD-10-CM

## 2022-07-16 ENCOUNTER — Encounter: Payer: Self-pay | Admitting: Radiology

## 2022-07-17 DIAGNOSIS — F411 Generalized anxiety disorder: Secondary | ICD-10-CM | POA: Diagnosis not present

## 2022-07-17 DIAGNOSIS — F3181 Bipolar II disorder: Secondary | ICD-10-CM | POA: Diagnosis not present

## 2022-07-27 ENCOUNTER — Other Ambulatory Visit: Payer: Self-pay | Admitting: Family Medicine

## 2022-07-27 DIAGNOSIS — G8929 Other chronic pain: Secondary | ICD-10-CM

## 2022-07-28 DIAGNOSIS — M180 Bilateral primary osteoarthritis of first carpometacarpal joints: Secondary | ICD-10-CM | POA: Diagnosis not present

## 2022-07-28 DIAGNOSIS — M79644 Pain in right finger(s): Secondary | ICD-10-CM | POA: Diagnosis not present

## 2022-07-28 DIAGNOSIS — M79642 Pain in left hand: Secondary | ICD-10-CM | POA: Insufficient documentation

## 2022-07-29 ENCOUNTER — Other Ambulatory Visit (HOSPITAL_COMMUNITY): Payer: Self-pay

## 2022-07-29 DIAGNOSIS — M189 Osteoarthritis of first carpometacarpal joint, unspecified: Secondary | ICD-10-CM | POA: Insufficient documentation

## 2022-07-31 ENCOUNTER — Encounter (HOSPITAL_BASED_OUTPATIENT_CLINIC_OR_DEPARTMENT_OTHER): Payer: Self-pay | Admitting: Orthopedic Surgery

## 2022-07-31 ENCOUNTER — Other Ambulatory Visit: Payer: Self-pay

## 2022-07-31 NOTE — Progress Notes (Signed)
   07/31/22 1149  PAT Phone Screen  Is the patient taking a GLP-1 receptor agonist? Yes  Has the patient been informed on holding medication? Yes  Do You Have Diabetes? Yes  Do You Have Hypertension? No  Have You Ever Been to the ER for Asthma? No  Have You Taken Oral Steroids in the Past 3 Months? No  Do you Take Phenteramine or any Other Diet Drugs? No  Recent  Lab Work, EKG, CXR? No  Do you have a history of heart problems? No  Any Recent Hospitalizations? No  Height 5\' 7"  (1.702 m)  Weight 123.4 kg  Pat Appointment Scheduled Yes

## 2022-08-04 ENCOUNTER — Encounter (HOSPITAL_BASED_OUTPATIENT_CLINIC_OR_DEPARTMENT_OTHER)
Admission: RE | Admit: 2022-08-04 | Discharge: 2022-08-04 | Disposition: A | Discharge status: Home / Self Care | Payer: Medicaid Other | Source: Ambulatory Visit | Attending: Orthopedic Surgery | Admitting: Orthopedic Surgery

## 2022-08-04 ENCOUNTER — Other Ambulatory Visit: Payer: Self-pay

## 2022-08-04 DIAGNOSIS — G709 Myoneural disorder, unspecified: Secondary | ICD-10-CM | POA: Diagnosis not present

## 2022-08-04 DIAGNOSIS — Z87891 Personal history of nicotine dependence: Secondary | ICD-10-CM | POA: Diagnosis not present

## 2022-08-04 DIAGNOSIS — J4489 Other specified chronic obstructive pulmonary disease: Secondary | ICD-10-CM | POA: Diagnosis not present

## 2022-08-04 DIAGNOSIS — Z7984 Long term (current) use of oral hypoglycemic drugs: Secondary | ICD-10-CM | POA: Diagnosis not present

## 2022-08-04 DIAGNOSIS — F419 Anxiety disorder, unspecified: Secondary | ICD-10-CM | POA: Diagnosis not present

## 2022-08-04 DIAGNOSIS — K219 Gastro-esophageal reflux disease without esophagitis: Secondary | ICD-10-CM | POA: Diagnosis not present

## 2022-08-04 DIAGNOSIS — Z01818 Encounter for other preprocedural examination: Secondary | ICD-10-CM | POA: Diagnosis not present

## 2022-08-04 DIAGNOSIS — E1143 Type 2 diabetes mellitus with diabetic autonomic (poly)neuropathy: Secondary | ICD-10-CM | POA: Diagnosis not present

## 2022-08-04 DIAGNOSIS — G4730 Sleep apnea, unspecified: Secondary | ICD-10-CM | POA: Diagnosis not present

## 2022-08-04 DIAGNOSIS — Z794 Long term (current) use of insulin: Secondary | ICD-10-CM | POA: Diagnosis not present

## 2022-08-04 DIAGNOSIS — Z7985 Long-term (current) use of injectable non-insulin antidiabetic drugs: Secondary | ICD-10-CM | POA: Diagnosis not present

## 2022-08-04 DIAGNOSIS — I10 Essential (primary) hypertension: Secondary | ICD-10-CM | POA: Diagnosis not present

## 2022-08-04 DIAGNOSIS — M1812 Unilateral primary osteoarthritis of first carpometacarpal joint, left hand: Secondary | ICD-10-CM | POA: Diagnosis not present

## 2022-08-04 DIAGNOSIS — E039 Hypothyroidism, unspecified: Secondary | ICD-10-CM | POA: Diagnosis not present

## 2022-08-04 DIAGNOSIS — K3184 Gastroparesis: Secondary | ICD-10-CM | POA: Diagnosis not present

## 2022-08-04 DIAGNOSIS — F319 Bipolar disorder, unspecified: Secondary | ICD-10-CM | POA: Diagnosis not present

## 2022-08-04 LAB — BASIC METABOLIC PANEL
Anion gap: 11 (ref 5–15)
BUN: 10 mg/dL (ref 6–20)
CO2: 24 mmol/L (ref 22–32)
Calcium: 8.8 mg/dL — ABNORMAL LOW (ref 8.9–10.3)
Chloride: 103 mmol/L (ref 98–111)
Creatinine, Ser: 0.95 mg/dL (ref 0.44–1.00)
GFR, Estimated: >60 mL/min (ref >60)
Glucose, Bld: 139 mg/dL — ABNORMAL HIGH (ref 70–99)
Potassium: 3.5 mmol/L (ref 3.5–5.1)
Sodium: 138 mmol/L (ref 135–145)

## 2022-08-04 NOTE — Progress Notes (Signed)

## 2022-08-05 ENCOUNTER — Ambulatory Visit (HOSPITAL_BASED_OUTPATIENT_CLINIC_OR_DEPARTMENT_OTHER): Payer: Medicaid Other | Admitting: Anesthesiology

## 2022-08-05 ENCOUNTER — Other Ambulatory Visit: Payer: Self-pay

## 2022-08-05 ENCOUNTER — Encounter (HOSPITAL_BASED_OUTPATIENT_CLINIC_OR_DEPARTMENT_OTHER): Payer: Self-pay | Admitting: Orthopedic Surgery

## 2022-08-05 ENCOUNTER — Encounter (HOSPITAL_BASED_OUTPATIENT_CLINIC_OR_DEPARTMENT_OTHER)
Admission: RE | Disposition: A | Discharge status: Home / Self Care | Payer: Self-pay | Source: Home / Self Care | Attending: Orthopedic Surgery

## 2022-08-05 ENCOUNTER — Ambulatory Visit (HOSPITAL_BASED_OUTPATIENT_CLINIC_OR_DEPARTMENT_OTHER): Payer: Medicaid Other

## 2022-08-05 ENCOUNTER — Ambulatory Visit (HOSPITAL_BASED_OUTPATIENT_CLINIC_OR_DEPARTMENT_OTHER)
Admission: RE | Admit: 2022-08-05 | Discharge: 2022-08-05 | Disposition: A | Discharge status: Home / Self Care | Payer: Medicaid Other | Attending: Orthopedic Surgery | Admitting: Orthopedic Surgery

## 2022-08-05 DIAGNOSIS — F418 Other specified anxiety disorders: Secondary | ICD-10-CM | POA: Diagnosis not present

## 2022-08-05 DIAGNOSIS — E039 Hypothyroidism, unspecified: Secondary | ICD-10-CM | POA: Diagnosis not present

## 2022-08-05 DIAGNOSIS — Z794 Long term (current) use of insulin: Secondary | ICD-10-CM

## 2022-08-05 DIAGNOSIS — M1812 Unilateral primary osteoarthritis of first carpometacarpal joint, left hand: Secondary | ICD-10-CM | POA: Diagnosis not present

## 2022-08-05 DIAGNOSIS — J4489 Other specified chronic obstructive pulmonary disease: Secondary | ICD-10-CM | POA: Insufficient documentation

## 2022-08-05 DIAGNOSIS — Z87891 Personal history of nicotine dependence: Secondary | ICD-10-CM

## 2022-08-05 DIAGNOSIS — Z7984 Long term (current) use of oral hypoglycemic drugs: Secondary | ICD-10-CM | POA: Insufficient documentation

## 2022-08-05 DIAGNOSIS — F319 Bipolar disorder, unspecified: Secondary | ICD-10-CM | POA: Insufficient documentation

## 2022-08-05 DIAGNOSIS — F419 Anxiety disorder, unspecified: Secondary | ICD-10-CM | POA: Insufficient documentation

## 2022-08-05 DIAGNOSIS — K3184 Gastroparesis: Secondary | ICD-10-CM | POA: Insufficient documentation

## 2022-08-05 DIAGNOSIS — E1143 Type 2 diabetes mellitus with diabetic autonomic (poly)neuropathy: Secondary | ICD-10-CM | POA: Insufficient documentation

## 2022-08-05 DIAGNOSIS — I10 Essential (primary) hypertension: Secondary | ICD-10-CM

## 2022-08-05 DIAGNOSIS — M1811 Unilateral primary osteoarthritis of first carpometacarpal joint, right hand: Secondary | ICD-10-CM

## 2022-08-05 DIAGNOSIS — Z7985 Long-term (current) use of injectable non-insulin antidiabetic drugs: Secondary | ICD-10-CM | POA: Insufficient documentation

## 2022-08-05 DIAGNOSIS — G709 Myoneural disorder, unspecified: Secondary | ICD-10-CM | POA: Insufficient documentation

## 2022-08-05 DIAGNOSIS — K219 Gastro-esophageal reflux disease without esophagitis: Secondary | ICD-10-CM | POA: Insufficient documentation

## 2022-08-05 DIAGNOSIS — G4730 Sleep apnea, unspecified: Secondary | ICD-10-CM | POA: Insufficient documentation

## 2022-08-05 HISTORY — PX: CARPOMETACARPEL SUSPENSION PLASTY: SHX5005

## 2022-08-05 LAB — GLUCOSE, CAPILLARY
Glucose-Capillary: 130 mg/dL — ABNORMAL HIGH (ref 70–99)
Glucose-Capillary: 149 mg/dL — ABNORMAL HIGH (ref 70–99)

## 2022-08-05 LAB — POCT PREGNANCY, URINE: Preg Test, Ur: NEGATIVE

## 2022-08-05 SURGERY — CARPOMETACARPEL (CMC) SUSPENSION PLASTY
Anesthesia: Regional | Site: Hand | Laterality: Left

## 2022-08-05 MED ORDER — CEFAZOLIN IN SODIUM CHLORIDE 3-0.9 GM/100ML-% IV SOLN
INTRAVENOUS | Status: AC
  Filled 2022-08-05: qty 100

## 2022-08-05 MED ORDER — LACTATED RINGERS IV SOLN: INTRAVENOUS | Status: DC

## 2022-08-05 MED ORDER — AMISULPRIDE (ANTIEMETIC) 5 MG/2ML IV SOLN: 10.0000 mg | Freq: Once | INTRAVENOUS | Status: DC | PRN

## 2022-08-05 MED ORDER — PROMETHAZINE HCL 25 MG/ML IJ SOLN: 6.2500 mg | INTRAMUSCULAR | Status: DC | PRN

## 2022-08-05 MED ORDER — MIDAZOLAM HCL 5 MG/5ML IJ SOLN
INTRAMUSCULAR | Status: DC | PRN
  Administered 2022-08-05: 2 mg via INTRAVENOUS

## 2022-08-05 MED ORDER — FENTANYL CITRATE (PF) 100 MCG/2ML IJ SOLN: 25.0000 ug | INTRAMUSCULAR | Status: DC | PRN

## 2022-08-05 MED ORDER — ONDANSETRON HCL 4 MG/2ML IJ SOLN
INTRAMUSCULAR | Status: AC
  Filled 2022-08-05: qty 2

## 2022-08-05 MED ORDER — OXYCODONE HCL 5 MG/5ML PO SOLN: 5.0000 mg | Freq: Once | ORAL | Status: DC | PRN

## 2022-08-05 MED ORDER — PHENYLEPHRINE 80 MCG/ML (10ML) SYRINGE FOR IV PUSH (FOR BLOOD PRESSURE SUPPORT)
PREFILLED_SYRINGE | INTRAVENOUS | Status: DC | PRN
  Administered 2022-08-05: 80 ug via INTRAVENOUS

## 2022-08-05 MED ORDER — FENTANYL CITRATE (PF) 100 MCG/2ML IJ SOLN
INTRAMUSCULAR | Status: AC
  Filled 2022-08-05: qty 2

## 2022-08-05 MED ORDER — ACETAMINOPHEN 160 MG/5ML PO SOLN: 325.0000 mg | ORAL | Status: DC | PRN

## 2022-08-05 MED ORDER — BUPIVACAINE-EPINEPHRINE (PF) 0.5% -1:200000 IJ SOLN
INTRAMUSCULAR | Status: DC | PRN
  Administered 2022-08-05: 30 mL via PERINEURAL

## 2022-08-05 MED ORDER — ACETAMINOPHEN 10 MG/ML IV SOLN
INTRAVENOUS | Status: AC
  Filled 2022-08-05: qty 100

## 2022-08-05 MED ORDER — MIDAZOLAM HCL 2 MG/2ML IJ SOLN
INTRAMUSCULAR | Status: AC
  Filled 2022-08-05: qty 2

## 2022-08-05 MED ORDER — 0.9 % SODIUM CHLORIDE (POUR BTL) OPTIME
TOPICAL | Status: DC | PRN
  Administered 2022-08-05: 300 mL

## 2022-08-05 MED ORDER — FENTANYL CITRATE (PF) 100 MCG/2ML IJ SOLN
INTRAMUSCULAR | Status: DC | PRN
  Administered 2022-08-05: 50 ug via INTRAVENOUS

## 2022-08-05 MED ORDER — CEFAZOLIN IN SODIUM CHLORIDE 3-0.9 GM/100ML-% IV SOLN
3.0000 g | INTRAVENOUS | Status: AC
  Administered 2022-08-05: 3 g via INTRAVENOUS

## 2022-08-05 MED ORDER — ONDANSETRON HCL 4 MG/2ML IJ SOLN
INTRAMUSCULAR | Status: DC | PRN
  Administered 2022-08-05: 4 mg via INTRAVENOUS

## 2022-08-05 MED ORDER — ACETAMINOPHEN 325 MG PO TABS: 325.0000 mg | ORAL_TABLET | ORAL | Status: DC | PRN

## 2022-08-05 MED ORDER — PROPOFOL 500 MG/50ML IV EMUL
INTRAVENOUS | Status: DC | PRN
  Administered 2022-08-05: 25 ug/kg/min via INTRAVENOUS

## 2022-08-05 MED ORDER — OXYCODONE HCL 5 MG PO TABS: 5.0000 mg | ORAL_TABLET | Freq: Four times a day (QID) | ORAL | 0 refills | Status: AC | PRN

## 2022-08-05 MED ORDER — ACETAMINOPHEN 10 MG/ML IV SOLN
1000.0000 mg | Freq: Once | INTRAVENOUS | Status: DC | PRN
  Administered 2022-08-05: 1000 mg via INTRAVENOUS

## 2022-08-05 MED ORDER — OXYCODONE HCL 5 MG PO TABS: 5.0000 mg | ORAL_TABLET | Freq: Once | ORAL | Status: DC | PRN

## 2022-08-05 MED ORDER — MIDAZOLAM HCL 2 MG/2ML IJ SOLN
2.0000 mg | Freq: Once | INTRAMUSCULAR | Status: AC
  Administered 2022-08-05: 1 mg via INTRAVENOUS

## 2022-08-05 MED ORDER — FENTANYL CITRATE (PF) 100 MCG/2ML IJ SOLN
100.0000 ug | Freq: Once | INTRAMUSCULAR | Status: AC
  Administered 2022-08-05: 50 ug via INTRAVENOUS

## 2022-08-05 SURGICAL SUPPLY — 61 items
ADH SKN CLS APL DERMABOND .7 (GAUZE/BANDAGES/DRESSINGS)
ANCH SUT SWVLOCK 8.5 (Anchor) ×1 IMPLANT
ANCHOR DX SWIVELOCK SL 3.5X8.5 (Anchor) IMPLANT
ANCHOR FIBERLOCK SUSPENSION (Anchor) IMPLANT
APL PRP STRL LF DISP 70% ISPRP (MISCELLANEOUS) ×1
BLADE SURG 15 STRL LF DISP TIS (BLADE) ×1 IMPLANT
BLADE SURG 15 STRL SS (BLADE) ×1
BNDG CMPR 5X3 KNIT ELC UNQ LF (GAUZE/BANDAGES/DRESSINGS)
BNDG CMPR 9X4 STRL LF SNTH (GAUZE/BANDAGES/DRESSINGS) ×1
BNDG ELASTIC 3INX 5YD STR LF (GAUZE/BANDAGES/DRESSINGS) IMPLANT
BNDG ELASTIC 4X5.8 VLCR STR LF (GAUZE/BANDAGES/DRESSINGS) ×1 IMPLANT
BNDG ESMARK 4X9 LF (GAUZE/BANDAGES/DRESSINGS) ×1 IMPLANT
BNDG GAUZE DERMACEA FLUFF 4 (GAUZE/BANDAGES/DRESSINGS) ×1 IMPLANT
BNDG GZE DERMACEA 4 6PLY (GAUZE/BANDAGES/DRESSINGS) ×1
BNDG PLASTER X FAST 3X3 WHT LF (CAST SUPPLIES) IMPLANT
BNDG PLASTER X FAST 4X5 WHT LF (CAST SUPPLIES) IMPLANT
BNDG PLSTR 5X4 XFST ST WHT LF (CAST SUPPLIES)
BNDG PLSTR 9X3 FST ST WHT (CAST SUPPLIES)
CHLORAPREP W/TINT 26 (MISCELLANEOUS) ×1 IMPLANT
CORD BIPOLAR FORCEPS 12FT (ELECTRODE) ×1 IMPLANT
COVER BACK TABLE 60X90IN (DRAPES) ×1 IMPLANT
COVER MAYO STAND STRL (DRAPES) ×1 IMPLANT
CUFF TOURN SGL QUICK 18X4 (TOURNIQUET CUFF) IMPLANT
CUFF TOURN SGL QUICK 24 (TOURNIQUET CUFF)
CUFF TRNQT CYL 24X4X16.5-23 (TOURNIQUET CUFF) IMPLANT
DERMABOND ADVANCED .7 DNX12 (GAUZE/BANDAGES/DRESSINGS) IMPLANT
DRAPE EXTREMITY T 121X128X90 (DISPOSABLE) ×1 IMPLANT
DRAPE OEC MINIVIEW 54X84 (DRAPES) ×1 IMPLANT
DRAPE SURG 17X23 STRL (DRAPES) ×1 IMPLANT
GAUZE SPONGE 4X4 12PLY STRL (GAUZE/BANDAGES/DRESSINGS) ×1 IMPLANT
GAUZE XEROFORM 1X8 LF (GAUZE/BANDAGES/DRESSINGS) ×1 IMPLANT
GLOVE BIO SURGEON STRL SZ7 (GLOVE) ×1 IMPLANT
GLOVE BIOGEL PI IND STRL 7.0 (GLOVE) ×1 IMPLANT
GOWN STRL REUS W/ TWL LRG LVL3 (GOWN DISPOSABLE) ×2 IMPLANT
GOWN STRL REUS W/TWL LRG LVL3 (GOWN DISPOSABLE) ×2
K-WIRE DBL .045X4 NSTRL (WIRE)
KWIRE DBL .045X4 NSTRL (WIRE) IMPLANT
NDL HYPO 25X1 1.5 SAFETY (NEEDLE) IMPLANT
NEEDLE HYPO 25X1 1.5 SAFETY (NEEDLE) IMPLANT
NS IRRIG 1000ML POUR BTL (IV SOLUTION) ×1 IMPLANT
PACK BASIN DAY SURGERY FS (CUSTOM PROCEDURE TRAY) ×1 IMPLANT
PAD CAST 3X4 CTTN HI CHSV (CAST SUPPLIES) ×1 IMPLANT
PADDING CAST COTTON 3X4 STRL (CAST SUPPLIES) ×1
SLEEVE SCD COMPRESS KNEE MED (STOCKING) IMPLANT
SLING ARM FOAM STRAP LRG (SOFTGOODS) IMPLANT
SPLINT FIBERGLASS 4X30 (CAST SUPPLIES) ×1 IMPLANT
STRIP CLOSURE SKIN 1/2X4 (GAUZE/BANDAGES/DRESSINGS) IMPLANT
SUCTION FRAZIER HANDLE 10FR (MISCELLANEOUS)
SUCTION TUBE FRAZIER 10FR DISP (MISCELLANEOUS) IMPLANT
SUT ETHIBOND 3-0 V-5 (SUTURE) IMPLANT
SUT ETHILON 4 0 PS 2 18 (SUTURE) ×1 IMPLANT
SUT MERSILENE 4 0 P 3 (SUTURE) ×1 IMPLANT
SUT MNCRL AB 3-0 PS2 18 (SUTURE) IMPLANT
SUT MNCRL AB 4-0 PS2 18 (SUTURE) IMPLANT
SUT SUPRAMID 3-0 (SUTURE) IMPLANT
SUT VICRYL 4-0 PS2 18IN ABS (SUTURE) IMPLANT
SYR BULB EAR ULCER 3OZ GRN STR (SYRINGE) ×1 IMPLANT
SYR CONTROL 10ML LL (SYRINGE) IMPLANT
TOWEL GREEN STERILE FF (TOWEL DISPOSABLE) ×2 IMPLANT
TUBE CONNECTING 20X1/4 (TUBING) IMPLANT
UNDERPAD 30X36 HEAVY ABSORB (UNDERPADS AND DIAPERS) ×1 IMPLANT

## 2022-08-05 NOTE — Progress Notes (Signed)
Assisted Dr. Hollis with left, supraclavicular, ultrasound guided block. Side rails up, monitors on throughout procedure. See vital signs in flow sheet. Tolerated Procedure well. 

## 2022-08-05 NOTE — H&P (Signed)
HAND SURGERY   HPI: Patient is a 52 y.o. 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  The thumb pain is limiting her ability to perform her job and her daily tasks around the house.  It is seriously interfering with her quality of life.  She presents today for left trapeziectomy with suspensionplasty.  Patient denies any changes to their medical history or new systemic symptoms today.    Past Medical History:  Diagnosis Date   Allergy    Anxiety    Arthritis    Asthma    Bipolar 1 disorder ([place])    Blood transfusion without reported diagnosis    with child birth 65 yrs ago    Bronchitis, chronic ([place])    Constipation    COVID-19 virus infection 06/21/2020   DDD (degenerative disc disease), cervical    Depression    Diabetes mellitus without complication ([place])    Dysphagia    Family history of adverse reaction to anesthesia    mother had a hard time waking up after knee surgery in 2021   Gastroparesis    GERD (gastroesophageal reflux disease)    History of kidney stones    History of MRSA infection    Hyperlipemia    Hypothyroid    Migraines    Pneumonia    Sleep apnea    no cpap    Past Surgical History:  Procedure Laterality Date   ABDOMINAL SURGERY     ANTERIOR CERVICAL DECOMP/DISCECTOMY FUSION N/A 10/09/2020   Procedure: Anterior Cervical Discectomy Fusion Cervical five-six;  Surgeon: Vallarie Mare, MD;  Location: [facility];  Service: Neurosurgery;  Laterality: N/A;   BIOPSY  02/04/2021   Procedure: BIOPSY;  Surgeon: Yetta Flock, MD;  Location: [facility];  Service: Gastroenterology;;   BOTOX INJECTION N/A 02/04/2021   Procedure: BOTOX INJECTION;  Surgeon: Yetta Flock, MD;  Location: [facility];  Service: Gastroenterology;  Laterality: N/A;   CARPAL TUNNEL RELEASE Bilateral 1999   CESAREAN  SECTION     CHOLECYSTECTOMY     COLONOSCOPY     ESOPHAGEAL MANOMETRY N/A 07/08/2020   Procedure: ESOPHAGEAL MANOMETRY (EM);  Surgeon: Yetta Flock, MD;  Location: [facility];  Service: Gastroenterology;  Laterality: N/A;   ESOPHAGOGASTRODUODENOSCOPY (EGD) WITH PROPOFOL N/A 02/04/2021   Procedure: ESOPHAGOGASTRODUODENOSCOPY (EGD) WITH PROPOFOL;  Surgeon: Yetta Flock, MD;  Location: [facility];  Service: Gastroenterology;  Laterality: N/A;   REPLACEMENT TOTAL KNEE Bilateral    SCLEROTHERAPY  02/04/2021   Procedure: SCLEROTHERAPY;  Surgeon: Yetta Flock, MD;  Location: [facility];  Service: Gastroenterology;;   THYROIDECTOMY     TONSILLECTOMY     TONSILLECTOMY AND ADENOIDECTOMY     TRIGGER FINGER RELEASE Left 06/16/2021   Procedure: LEFT THUMB TRIGGER FINGER RELEASE;  Surgeon: Mordecai Rasmussen, MD;  Location: [facility];  Service: Orthopedics;  Laterality: Left;   TRIGGER FINGER RELEASE Right 09/29/2021   Procedure: RELEASE TRIGGER FINGER/A-1 PULLEY;  Surgeon: Mordecai Rasmussen, MD;  Location: [facility];  Service: Orthopedics;  Laterality: Right;  Right ring, index and thumb trigger finger   UPPER GASTROINTESTINAL ENDOSCOPY     Social History   Socioeconomic History   Marital status: Divorced    Spouse name: Not on file   Number of children: Not on file   Years of education: Not on file   Highest education level: Not on file  Occupational History  Not on file  Tobacco Use   Smoking status: Former    Packs/day: 1.00    Years: 5.00    Additional pack years: 0.00    Total pack years: 5.00    Types: Cigarettes    Quit date: 04/04/1993    Years since quitting: 29.3   Smokeless tobacco: Never   Tobacco comments:    smoked when went out with friends/social smoker. pack would last a week.  Vaping Use   Vaping Use: Former  Substance and Sexual Activity   Alcohol use: No   Drug use: No   Sexual activity: Yes    Birth control/protection: I.U.D.  Other Topics Concern    Not on file  Social History Narrative   Not on file   Social Determinants of Health   Financial Resource Strain: Not on file  Food Insecurity: Not on file  Transportation Needs: Not on file  Physical Activity: Not on file  Stress: Not on file  Social Connections: Not on file   Family History  Problem Relation Age of Onset   Mental illness Other    Diabetes Other    Healthy Mother    Cancer Father    Non-Hodgkin's lymphoma Father    Breast cancer Paternal Grandmother    Lung cancer Paternal Grandmother    Esophageal cancer Other    Colon cancer Neg Hx    Stomach cancer Neg Hx    Pancreatic cancer Neg Hx    Colon polyps Neg Hx    - negative except otherwise stated in the family history section Allergies  Allergen Reactions   Diphenhydramine Other (See Comments)    Hair feels like it is crawling   Morphine Nausea And Vomiting    PCA PUMP/ DRIP -- N/V IV PUSH IN ER NO PROBLEM PER PT.     Prednisone Nausea And Vomiting   Adhesive [Tape]     Paper tape causes irritation    Erythromycin Hives   Latex    Metformin And Related     Diarrhea with IR and XR products   Sulfonamide Derivatives Other (See Comments)    Does not take due to family history   Prior to Admission medications   Medication Sig Start Date End Date Taking? Authorizing Provider  azelastine (ASTELIN) 0.1 % nasal spray Place 1 spray into both nostrils 2 (two) times daily. 05/12/22  Yes Gottschalk, Ashly M, DO  buPROPion (WELLBUTRIN XL) 300 MG 24 hr tablet Take 300 mg by mouth daily.   Yes [provider]  cetirizine (ZYRTEC) 10 MG tablet Take 10 mg by mouth daily as needed for allergies.   Yes [provider]  dexlansoprazole (DEXILANT) 60 MG capsule Take 1 capsule (60 mg total) by mouth daily. 06/12/22  Yes Armbruster, Carlota Raspberry, MD  diclofenac Sodium (VOLTAREN) 1 % GEL APPLY 2 GRAMS TO AFFECTED AREA 4 TIMES A DAY 07/27/22  Yes Gottschalk, Ashly M, DO  dicyclomine (BENTYL) 20 MG tablet  TAKE 1 TABLET (20 MG TOTAL) BY MOUTH EVERY 6 (SIX) HOURS. Patient taking differently: Take 20 mg by mouth every 6 (six) hours as needed for spasms (abdominal cramps). 08/07/20  Yes Gottschalk, Leatrice Jewels M, DO  gabapentin (NEURONTIN) 300 MG capsule TAKE 1 CAPSULE BY MOUTH THREE TIMES A DAY 05/12/22  Yes Gottschalk, Ashly M, DO  hydrOXYzine (ATARAX) 10 MG tablet Take 10 mg by mouth 3 (three) times daily as needed.   Yes [provider]  insulin degludec (TRESIBA FLEXTOUCH) 200 UNIT/ML FlexTouch Pen  Inject 50-60 Units into the skin at bedtime. 05/12/22  Yes Ronnie Doss M, DO  levothyroxine (SYNTHROID) 125 MCG tablet Take 1 tablet (125 mcg total) by mouth daily. 05/12/22  Yes Ronnie Doss M, DO  linaclotide (LINZESS) 72 MCG capsule Take 1 capsule (72 mcg total) by mouth daily before breakfast. 02/10/22  Yes Gottschalk, Ashly M, DO  Mepolizumab (NUCALA) 100 MG/ML SOAJ Inject 1 mL (100 mg total) into the skin every 28 (twenty-eight) days. 07/03/22  Yes Hunsucker, Bonna Gains, MD  methocarbamol (ROBAXIN) 500 MG tablet 500 mg. 06/04/22  Yes [provider]  montelukast (SINGULAIR) 10 MG tablet TAKE 1 TABLET BY MOUTH AT BEDTIME 04/14/22  Yes Gottschalk, Ashly M, DO  naproxen (NAPROSYN) 500 MG tablet 500 mg. 06/08/22  Yes [provider]  pioglitazone (ACTOS) 45 MG tablet Take 1 tablet (45 mg total) by mouth daily. 05/12/22  Yes Ronnie Doss M, DO  promethazine (PHENERGAN) 25 MG tablet Take 1 tablet (25 mg total) by mouth every 6 (six) hours as needed for nausea or vomiting. 06/11/20  Yes Rosemarie Ax, MD  rosuvastatin (CRESTOR) 40 MG tablet Take 1 tablet (40 mg total) by mouth at bedtime. 05/12/22  Yes Gottschalk, Leatrice Jewels M, DO  sertraline (ZOLOFT) 100 MG tablet Take 200 mg by mouth daily.   Yes [provider]  tirzepatide Darcel Bayley) 12.5 MG/0.5ML Pen Inject 12.5 mg into the skin once a week. 03/24/22  Yes Gottschalk, Ashly M, DO  VALIUM 10 MG tablet Take 10 mg by  mouth every 8 (eight) hours as needed for anxiety. Rx by [facility] 01/28/22  Yes [provider]  Accu-Chek FastClix Lancets MISC Test blood sugars daily Dx E11.9 11/27/21   Ronnie Doss M, DO  albuterol (PROVENTIL) (2.5 MG/3ML) 0.083% nebulizer solution Take 3 mLs (2.5 mg total) by nebulization every 6 (six) hours as needed for wheezing or shortness of breath. Fill upon patient request 09/26/20   Martyn Ehrich, NP  albuterol (VENTOLIN HFA) 108 (90 Base) MCG/ACT inhaler Inhale 2 puffs into the lungs every 6 (six) hours as needed for wheezing or shortness of breath. Fill upon patient request 09/26/20   Martyn Ehrich, NP  Blood Glucose Monitoring Suppl (ACCU-CHEK GUIDE ME) w/Device KIT Use to check BS BID and as needed. DX: E11.9 01/26/22   Ronnie Doss M, DO  budesonide-formoterol (SYMBICORT) 160-4.5 MCG/ACT inhaler Inhale 2 puffs into the lungs 2 (two) times daily. Patient taking differently: Inhale 2 puffs into the lungs 2 (two) times daily as needed (shortness of breath). 09/26/20   Martyn Ehrich, NP  Continuous Blood Gluc Sensor (FREESTYLE LIBRE 3 SENSOR) MISC Place 1 sensor on the skin every 14 days. Use to check glucose continuously; DX:E11.65 03/24/22   Ronnie Doss M, DO  glucose blood (ACCU-CHEK GUIDE) test strip Test blood sugars daily Dx E11.9 11/27/21   Ronnie Doss M, DO  Insulin Pen Needle (B-D UF III MINI PEN NEEDLES) 31G X 5 MM MISC use as directed once daily Dx E11.8 02/10/22   Ronnie Doss M, DO  medroxyPROGESTERone (DEPO-PROVERA) 150 MG/ML injection INJECT 1 ML (150 MG TOTAL) INTO THE MUSCLE EVERY 3 (THREE) MONTHS 06/16/22   Ronnie Doss M, DO  nystatin cream (MYCOSTATIN) Apply 1 Application topically 2 (two) times daily. x2 weeks 12/26/21   Ronnie Doss M, DO   No results found. - Positive ROS: All other systems have been reviewed and were otherwise negative with the exception of those mentioned in the HPI and as above.  Physical  Exam: General: No acute distress, resting comfortably Cardiovascular: BUE warm and well perfused, normal rate Respiratory: Normal WOB on RA Skin: Warm and dry Neurologic: Sensation intact distally Psychiatric: Patient is at baseline mood and affect  Left Upper Extremity  Mild swelling at the dorsal aspect of the thumb around the [facility] joint.  She has a positive CMC grind test with pain and crepitus.  There is minimal static/dynamic MCP hyper-extension.  There is no apparent palmar abduction contracture of the thumb. Sensation is intact in the hand in the median, ulnar, and radial nerve distributions.  Her skin is clean and dry without overlying erythema or lesions.  Her hand is warm and well perfused w/ BCR.  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    Plan: OR today for left thumb trapeziectomy with internal brace type suspensionplasty. We again reviewed the risks of surgery which include bleeding, infection, damage to neurovascular structures, persistent symptoms, persistent pain, thumb stiffness or loss of motion, need for prolonged therapy, need for additional surgery.  Informed consent was signed.  All questions were answered.   Sherilyn Cooter, M.D. [facility] 8:38 AM

## 2022-08-05 NOTE — Transfer of Care (Signed)
Immediate Anesthesia Transfer of Care Note  Patient: Judith [name]  Procedure(s) Performed: Left thumb trapeziectomy and suspensionplasty (Left: Hand)  Patient Location: PACU  Anesthesia Type:MAC combined with regional for post-op pain  Level of Consciousness: awake  Airway & Oxygen Therapy: Patient Spontanous Breathing and Patient connected to face mask oxygen  Post-op Assessment: Report given to RN and Post -op Vital signs reviewed and stable  Post vital signs: Reviewed and stable  Last Vitals:  Vitals Value Taken Time  BP    Temp    Pulse 90 08/05/22 1219  Resp 16 08/05/22 1219  SpO2 98 % 08/05/22 1219  Vitals shown include unvalidated device data.  Last Pain:  Vitals:   08/05/22 0850  TempSrc: Oral  PainSc: 7       Patients Stated Pain Goal: 8 ([id number])  Complications: No notable events documented.

## 2022-08-05 NOTE — Anesthesia Procedure Notes (Signed)
Anesthesia Regional Block: Supraclavicular block   Pre-Anesthetic Checklist: , timeout performed,  Correct Patient, Correct Site, Correct Laterality,  Correct Procedure, Correct Position, site marked,  Risks and benefits discussed,  Surgical consent,  Pre-op evaluation,  At surgeon's request and post-op pain management  Laterality: Left  Prep: chloraprep       Needles:  Injection technique: Single-shot  Needle Type: Echogenic Stimulator Needle     Needle Length: 9cm  Needle Gauge: 21     Additional Needles:   Procedures:,,,, ultrasound used (permanent image in chart),,    Narrative:  Start time: 08/05/2022 9:10 AM End time: 08/05/2022 9:15 AM Injection made incrementally with aspirations every 5 mL.  Performed by: Personally  Anesthesiologist: Effie Berkshire, MD  Additional Notes: Discussed risks and benefits of the nerve block in detail, including but not limited vascular injury, permanent nerve damage and infection.   Patient tolerated the procedure well. Local anesthetic introduced in an incremental fashion under minimal resistance after negative aspirations. No paresthesias were elicited. After completion of the procedure, no acute issues were identified and patient continued to be monitored by RN.

## 2022-08-05 NOTE — Anesthesia Postprocedure Evaluation (Signed)
Anesthesia Post Note  Patient: Judith [name]  Procedure(s) Performed: Left thumb trapeziectomy and suspensionplasty (Left: Hand)     Patient location during evaluation: PACU Anesthesia Type: Regional Level of consciousness: awake and alert Pain management: pain level controlled Vital Signs Assessment: post-procedure vital signs reviewed and stable Respiratory status: spontaneous breathing, nonlabored ventilation, respiratory function stable and patient connected to nasal cannula oxygen Cardiovascular status: stable and blood pressure returned to baseline Postop Assessment: no apparent nausea or vomiting Anesthetic complications: no  No notable events documented.  Last Vitals:  Vitals:   08/05/22 1300 08/05/22 1320  BP: 108/76 119/76  Pulse: 80 85  Resp: 16 16  Temp:  (!) 36.1 C  SpO2: 96% 96%    Last Pain:  Vitals:   08/05/22 1320  TempSrc: Oral  PainSc: [place]

## 2022-08-05 NOTE — Anesthesia Preprocedure Evaluation (Addendum)
Anesthesia Evaluation  Patient identified by MRN, date of birth, ID band Patient awake    Reviewed: Allergy & Precautions, NPO status , [name] Chart, lab work & pertinent test results  Airway Mallampati: I  TM Distance: >3 FB Neck ROM: Full    Dental  (+) Edentulous Upper, Edentulous Lower   Pulmonary asthma , sleep apnea , pneumonia, resolved, former smoker   breath sounds clear to auscultation       Cardiovascular hypertension,  Rhythm:Regular Rate:Normal     Neuro/Psych  Headaches PSYCHIATRIC DISORDERS Anxiety Depression Bipolar Disorder    Neuromuscular disease    GI/Hepatic Neg liver ROS,GERD  ,,  Endo/Other  diabetesHypothyroidism    Renal/GU negative Renal ROS     Musculoskeletal  (+) Arthritis ,    Abdominal   Peds  Hematology negative hematology ROS (+)   Anesthesia Other Findings   Reproductive/Obstetrics                             Anesthesia Physical Anesthesia Plan  ASA: 3  Anesthesia Plan: Regional   Post-op Pain Management: Minimal or no pain anticipated   Induction: Intravenous  PONV Risk Score and Plan: 2 and Ondansetron and Propofol infusion  Airway Management Planned: Natural Airway and Simple Face Mask  Additional Equipment: None  Intra-op Plan:   Post-operative Plan:   Informed Consent: I have reviewed the patients History and Physical, chart, labs and discussed the procedure including the risks, benefits and alternatives for the proposed anesthesia with the patient or authorized representative who has indicated his/her understanding and acceptance.       Plan Discussed with: CRNA  Anesthesia Plan Comments:        Anesthesia Quick Evaluation

## 2022-08-05 NOTE — Discharge Instructions (Addendum)
Judith [name], M.D. Hand Surgery  POST-OPERATIVE DISCHARGE INSTRUCTIONS   PRESCRIPTIONS: You may have been given a prescription to be taken as directed for post-operative pain control.  You may also take over the counter ibuprofen/aleve and tylenol for pain. Take this as directed on the packaging. Do not exceed 3000 mg tylenol/acetaminophen in 24 hours.  Ibuprofen 600-800 mg (3-4) tablets by mouth every 6 hours as needed for pain.  OR Aleve 2 tablets by mouth every 12 hours (twice daily) as needed for pain.  AND/OR Tylenol 1000 mg (2 tablets) every 8 hours as needed for pain.  Please use your pain medication carefully, as refills are limited and you may not be provided with one.  As stated above, please use over the counter pain medicine - it will also be helpful with decreasing your swelling.    ANESTHESIA: After your surgery, post-surgical discomfort or pain is likely. This discomfort can last several days to a few weeks. At certain times of the day your discomfort may be more intense.   Did you receive a nerve block?  A nerve block can provide pain relief for one hour to two days after your surgery. As long as the nerve block is working, you will experience little or no sensation in the area the surgeon operated on.  As the nerve block wears off, you will begin to experience pain or discomfort. It is very important that you begin taking your prescribed pain medication before the nerve block fully wears off. Treating your pain at the first sign of the block wearing off will ensure your pain is better controlled and more tolerable when full-sensation returns. Do not wait until the pain is intolerable, as the medicine will be less effective. It is better to treat pain in advance than to try and catch up.   General Anesthesia:  If you did not receive a nerve block during your surgery, you will need to start taking your pain medication shortly after your surgery and should continue  to do so as prescribed by your surgeon.     ICE AND ELEVATION: You may use ice for the first 48-72 hours, but it is not critical.   Motion of your fingers is very important to decrease the swelling.  Elevation, as much as possible for the next 48 hours, is critical for decreasing swelling as well as for pain relief. Elevation means when you are seated or lying down, you hand should be at or above your heart. When walking, the hand needs to be at or above the level of your elbow.  If the bandage gets too tight, it may need to be loosened. Please contact our office and we will instruct you in how to do this.    SURGICAL BANDAGES:  Keep your dressing and/or splint clean and dry at all times.  Do not remove until you are seen again in the office.  If careful, you may place a plastic bag over your bandage and tape the end to shower, but be careful, do not get your bandages wet.     Post Anesthesia Home Care Instructions  Activity: Get plenty of rest for the remainder of the day. A responsible individual must stay with you for 24 hours following the procedure.  For the next 24 hours, DO NOT: -Drive a car -Paediatric nurse -Drink alcoholic beverages -Take any medication unless instructed by your physician -Make any legal decisions or sign important papers.  Meals: Start with liquid foods such  as gelatin or soup. Progress to regular foods as tolerated. Avoid greasy, spicy, heavy foods. If nausea and/or vomiting occur, drink only clear liquids until the nausea and/or vomiting subsides. Call your physician if vomiting continues.  Special Instructions/Symptoms: Your throat may feel dry or sore from the anesthesia or the breathing tube placed in your throat during surgery. If this causes discomfort, gargle with warm salt water. The discomfort should disappear within 24 hours.  I Regional Anesthesia Blocks  1. Numbness or the inability to move the "blocked" extremity may last from 3-48 hours  after placement. The length of time depends on the medication injected and your individual response to the medication. If the numbness is not going away after 48 hours, call your surgeon.  2. The extremity that is blocked will need to be protected until the numbness is gone and the  Strength has returned. Because you cannot feel it, you will need to take extra care to avoid injury. Because it may be weak, you may have difficulty moving it or using it. You may not know what position it is in without looking at it while the block is in effect.  3. For blocks in the legs and feet, returning to weight bearing and walking needs to be done carefully. You will need to wait until the numbness is entirely gone and the strength has returned. You should be able to move your leg and foot normally before you try and bear weight or walk. You will need someone to be with you when you first try to ensure you do not fall and possibly risk injury.  4. Bruising and tenderness at the needle site are common side effects and will resolve in a few days.  5. Persistent numbness or new problems with movement should be communicated to the surgeon or the [facility] [phone] Elias-Fela Solis [phone]).    HAND THERAPY:  You will be contacted by the hand therapy office to scheduled your first appointment.  You may see the therapist before your first postop visit.    ACTIVITY AND WORK: You are encouraged to move any fingers which are not in the bandage.  You may not do any heavy work with your affected hand for about 2 weeks.  No lifting with this left upper extremity.    [facility] Second Floor, [place] [place], [place] [address] [phone]

## 2022-08-05 NOTE — Op Note (Addendum)
Date of Surgery: 08/05/2022  INDICATIONS: Patient is a 52 y.o.-year-old female with left thumb carpometacarpal osteoarthritis that has failed conservative management including activity modification, bracing, anti-inflammatory medications, and corticosteroid injections.  Her thumb pain and decreased function is limiting her ability to perform her daily tasks both at home and at work. We discussed continued conservative management versus trapeziectomy with suspensionplasty in the office with the patient wishing to proceed with surgery.  The risks, benefits, and alternatives to surgery were again discussed with the patient in the preoperative area. The patient wishes to proceed with surgery.  Informed consent was signed after our discussion.   PREOPERATIVE DIAGNOSIS:  Left thumb carpometacarpal osteoarthritis  POSTOPERATIVE DIAGNOSIS: Same.  PROCEDURE:  Left thumb trapeziectomy (56387) Basal joint arthroplasty with Fiberlock suture suspensionplasty (56433)   SURGEON: Waylan Rocher, M.D.  ASSIST: None  ANESTHESIA:  Regional + MAC  IV FLUIDS AND URINE: See anesthesia.  ESTIMATED BLOOD LOSS: <5 mL.  IMPLANTS:  Implant Name Type Inv. Item Serial No. Manufacturer Lot No. LRB No. Used Action  [employer] SUSPENSION - [id number] Anchor ANCHOR Denton Lank  [employer] [id number] Left 1 Implanted  ANCHOR DX SWIVELOCK SL 3.5X8.5 - [id number] Anchor ANCHOR DX SWIVELOCK SL 3.5X8.5  [employer] [id number] Left 1 Implanted     DRAINS: None  COMPLICATIONS: None  DESCRIPTION OF PROCEDURE: The patient was met in the preoperative holding area where the surgical site was marked and the consent form was signed.  The patient was then taken to the operating room on the stretcher and a hand table placed adjacent to the operative extremity and locked into place.  All bony prominences were well padded.  A tourniquet was applied to the left upper arm.  Monitored sedation was induced.  The  operative extremity was prepped and draped in the usual and sterile fashion.  A formal time-out was performed to confirm that this was the correct patient, surgery, side, and site.   Following formal timeout, the limb was gently exsanguinated with an esmarch bandage and the tourniquet inflated to 250 mmHg.  A longitudinal incision was made over the dorsal aspect of the thumb from just distal to the [facility] joint to just distal to the radial styloid.  The skin was incised.  Blunt dissection was used with a tenotomy scissor to identify branches of the superficial radial nerve.  These were protected throughout the surgery.  Small superficial veins were coagulated with bipolar cautery as needed.  The APL and EPB tendons were identified.  The fascia overlying the tendons was divided using a tenotomy scissor.  Blunt dissection was used to identify the branch of the radial artery overlying the trapezium.  The artery was dissected free of adjacent soft tissue as was the accompanying veins.  Small branches were coagulated to allow for mobilization of the vessels.  A Weitlaner retractor was placed in the wound retracting the radial artery and accompanying vein away from the overlying joint capsule.  A 15 blade was used to perform a capsulotomy over the dorsal aspect of the metacarpal and along the dorsal aspect of the trapezium.  The capsuloligamentous structures were then circumferentially divided from their insertion on the trapezium.  A McGlamry elevator was then used to divide the remaining capsular ligamentous structures taking care to protect the underlying flexor carpi radialis tendon.  The trapezium was removed en bloc.  There was significant eburnation of the articular surface.  There was similar eburnation of the base of the metacarpal.  A Glorious Peach  elevator was used to inspect the articulation between the trapezoid and the scaphoid.  There were no degenerative changes present.  The base of the index metacarpal was  identified.  A guidewire was then placed obliquely across the base of the index metacarpal exiting just distal to the interspace between the second and third metacarpals.  AP and lateral views demonstrated appropriate guidewire placement.  The drill guide was then screwed into the base of the index metacarpal.  The previously placed guidewire was removed and the path for the fiber lock anchor was drilled bicortically.  The anchor was then gently malleted into place.  Traction was pulled on the limbs of the suture which set the anchor.  I then turned my attention to the base of the thumb metacarpal.  A guidewire was then advanced at a 45 degree angle from the radial base of the thumb metacarpal midway between volar and dorsal.  This would ensure that the suture tape would sit nicely in the saddle of the metacarpal.  The previously placed guidewire was then overdrilled with the appropriate size drill.  The thumb was held in adducted position with slight traction.  The 2 limbs of the fiber tape were then inserted into the fork of the swivel lock anchor.  The anchor was then advanced into the base of the metacarpal with good bony purchase.  The anchor was buried below the subchondral bone.  With the suspension performed, the thumb had full passive abduction and abduction.  There was no subsidence with axial load.  AP and lateral fluoroscopic views demonstrated anatomic suspension of the thumb metacarpal.  At this point the wound was thoroughly irrigated with copious sterile saline.  The capsulotomy was repaired using a 4-0 Mersilene suture.  The tourniquet was deflated and hemostasis was achieved with direct pressure over the wound and bipolar electrocautery.  The hand was warm, pink, and well-perfused with the tourniquet deflated.  The skin was then closed in layered fashion using a 3-0 Monocryl suture in a buried interrupted fashion followed by 4-0 nylon suture in a horizontal mattress fashion.  The thumb was then  dressed with Xeroform, folded Kerlix, cast padding, and a well-padded thumb spica splint was applied.   The patient was reversed from sedation.  The drapes were taken down.  All counts were correct x 2 at the end of the procedure.  The patient was then taken to the PACU in stable condition.   POSTOPERATIVE PLAN: She will be discharged to home with appropriate pain medication and discharge instructions.  A referral will be placed to hand therapy to begin the rehab process.  I will see her back in the office in 10-14 days for her first postop visit.   Audria Nine, MD 12:26 PM

## 2022-08-06 ENCOUNTER — Encounter (HOSPITAL_BASED_OUTPATIENT_CLINIC_OR_DEPARTMENT_OTHER): Payer: Self-pay | Admitting: Orthopedic Surgery

## 2022-08-10 ENCOUNTER — Other Ambulatory Visit: Payer: Self-pay | Admitting: Family Medicine

## 2022-08-10 DIAGNOSIS — E118 Type 2 diabetes mellitus with unspecified complications: Secondary | ICD-10-CM

## 2022-08-18 DIAGNOSIS — M79642 Pain in left hand: Secondary | ICD-10-CM | POA: Diagnosis not present

## 2022-08-19 DIAGNOSIS — M79642 Pain in left hand: Secondary | ICD-10-CM | POA: Diagnosis not present

## 2022-08-24 ENCOUNTER — Ambulatory Visit: Payer: Medicaid Other | Admitting: Gastroenterology

## 2022-08-24 ENCOUNTER — Telehealth: Payer: Self-pay | Admitting: Gastroenterology

## 2022-08-24 NOTE — Telephone Encounter (Signed)
Unfortunately, there are no sooner appts available at this time (that includes Virtual or in person). Pt can be scheduled with an APP for a possible sooner appt. Thanks

## 2022-08-24 NOTE — Telephone Encounter (Signed)
Patient is calling to reschedule her 10:30 appt today, wondering if there is a way she could do a virtual visit with Dr Adela Lank and maybe be able to see him sooner. Please advise

## 2022-08-25 DIAGNOSIS — T8141XA Infection following a procedure, superficial incisional surgical site, initial encounter: Secondary | ICD-10-CM | POA: Insufficient documentation

## 2022-08-25 DIAGNOSIS — M79645 Pain in left finger(s): Secondary | ICD-10-CM | POA: Insufficient documentation

## 2022-08-27 ENCOUNTER — Other Ambulatory Visit: Payer: Self-pay

## 2022-08-31 ENCOUNTER — Encounter: Payer: Self-pay | Admitting: *Deleted

## 2022-08-31 ENCOUNTER — Other Ambulatory Visit: Payer: Self-pay

## 2022-09-01 DIAGNOSIS — M79645 Pain in left finger(s): Secondary | ICD-10-CM | POA: Diagnosis not present

## 2022-09-04 DIAGNOSIS — M180 Bilateral primary osteoarthritis of first carpometacarpal joints: Secondary | ICD-10-CM | POA: Diagnosis not present

## 2022-09-04 DIAGNOSIS — M1812 Unilateral primary osteoarthritis of first carpometacarpal joint, left hand: Secondary | ICD-10-CM | POA: Diagnosis not present

## 2022-09-04 DIAGNOSIS — T8141XA Infection following a procedure, superficial incisional surgical site, initial encounter: Secondary | ICD-10-CM | POA: Diagnosis not present

## 2022-09-06 ENCOUNTER — Ambulatory Visit: Payer: Medicaid Other

## 2022-09-06 ENCOUNTER — Ambulatory Visit
Admission: RE | Admit: 2022-09-06 | Discharge: 2022-09-06 | Disposition: A | Discharge status: Home / Self Care | Payer: Medicaid Other | Source: Ambulatory Visit

## 2022-09-06 VITALS — BP 140/82 | HR 86 | Temp 98.5°F | Resp 16

## 2022-09-06 DIAGNOSIS — H9202 Otalgia, left ear: Secondary | ICD-10-CM

## 2022-09-06 DIAGNOSIS — K1370 Unspecified lesions of oral mucosa: Secondary | ICD-10-CM

## 2022-09-06 DIAGNOSIS — J029 Acute pharyngitis, unspecified: Secondary | ICD-10-CM

## 2022-09-06 LAB — POCT RAPID STREP A (OFFICE): Rapid Strep A Screen: NEGATIVE

## 2022-09-06 NOTE — ED Provider Notes (Signed)
[facility]  Note:  This document was prepared using Dragon voice recognition software and may include unintentional dictation errors.  MRN: [id number] DOB: [birth year] DATE: 09/06/22   Subjective:  Chief Complaint:  Chief Complaint  Patient presents with   Sore Throat    Headache , fatigue , nausea - Entered by patient     HPI: Judith [name] is a 52 y.o. female presenting for sore throat and left ear pain for the past week.  Patient states that her roommate is positive for strep.  She reports pain with swallowing and talking.  She has had a headache as well. She noticed a lesion on the roof of her mouth a few days ago. Denies fever, vomiting, abdominal pain, nasal congestion, rhinorrhea, cough. Endorses sore throat, headache, nausea, fatigue. Presents NAD.  Prior to Admission medications   Medication Sig Start Date End Date Taking? Authorizing Provider  atorvastatin (LIPITOR) 40 MG tablet Take by mouth. 07/05/20  Yes [provider]  cyclobenzaprine (FLEXERIL) 10 MG tablet Take 1 tablet 3 times a day by oral route as needed. 07/10/20  Yes [provider]  lamoTRIgine (LAMICTAL) 150 MG tablet Take by mouth. 12/04/16  Yes [provider]  lisdexamfetamine (VYVANSE) 70 MG capsule Take by mouth. 03/03/18  Yes [provider]  Accu-Chek FastClix Lancets MISC Test blood sugars daily Dx E11.9 11/27/21   Raliegh Ip, DO  ACCU-CHEK GUIDE test strip TEST BLOOD SUGARS DAILY DX E11.9 08/10/22   Delynn Flavin M, DO  albuterol (PROVENTIL) (2.5 MG/3ML) 0.083% nebulizer solution Take 3 mLs (2.5 mg total) by nebulization every 6 (six) hours as needed for wheezing or shortness of breath. Fill upon patient request 09/26/20   Glenford Bayley, NP  albuterol (VENTOLIN HFA) 108 (90 Base) MCG/ACT inhaler Inhale 2 puffs into the lungs every 6 (six) hours as needed for wheezing or shortness of breath. Fill upon patient request 09/26/20   Glenford Bayley, NP   azelastine (ASTELIN) 0.1 % nasal spray Place 1 spray into both nostrils 2 (two) times daily. 05/12/22   Raliegh Ip, DO  Blood Glucose Monitoring Suppl (ACCU-CHEK GUIDE ME) w/Device KIT Use to check BS BID and as needed. DX: E11.9 01/26/22   Delynn Flavin M, DO  budesonide-formoterol (SYMBICORT) 160-4.5 MCG/ACT inhaler Inhale 2 puffs into the lungs 2 (two) times daily. Patient taking differently: Inhale 2 puffs into the lungs 2 (two) times daily as needed (shortness of breath). 09/26/20   Glenford Bayley, NP  buPROPion (WELLBUTRIN XL) 300 MG 24 hr tablet Take 300 mg by mouth daily.    [provider]  cetirizine (ZYRTEC) 10 MG tablet Take 10 mg by mouth daily as needed for allergies.    [provider]  Continuous Blood Gluc Sensor (FREESTYLE LIBRE 3 SENSOR) MISC Place 1 sensor on the skin every 14 days. Use to check glucose continuously; DX:E11.65 03/24/22   Delynn Flavin M, DO  dexlansoprazole (DEXILANT) 60 MG capsule Take 1 capsule (60 mg total) by mouth daily. 06/12/22   Armbruster, Willaim Rayas, MD  diclofenac Sodium (VOLTAREN) 1 % GEL APPLY 2 GRAMS TO AFFECTED AREA 4 TIMES A DAY 07/27/22   Gottschalk, Ashly M, DO  dicyclomine (BENTYL) 20 MG tablet TAKE 1 TABLET (20 MG TOTAL) BY MOUTH EVERY 6 (SIX) HOURS. Patient taking differently: Take 20 mg by mouth every 6 (six) hours as needed for spasms (abdominal cramps). 08/07/20   Raliegh Ip, DO  Docusate Sodium (DSS) 100 MG  CAPS Take 1 capsule by mouth 2 (two) times daily.    [provider]  gabapentin (NEURONTIN) 300 MG capsule TAKE 1 CAPSULE BY MOUTH THREE TIMES A DAY 05/12/22   Delynn Flavin M, DO  hydrOXYzine (ATARAX) 10 MG tablet Take 10 mg by mouth 3 (three) times daily as needed.    [provider]  insulin degludec (TRESIBA FLEXTOUCH) 200 UNIT/ML FlexTouch Pen Inject 50-60 Units into the skin at bedtime. 05/12/22   Raliegh Ip, DO  Insulin Pen Needle (B-D UF III MINI PEN  NEEDLES) 31G X 5 MM MISC use as directed once daily Dx E11.8 02/10/22   Delynn Flavin M, DO  levofloxacin (LEVAQUIN) 500 MG tablet Take 1 tablet by mouth daily.    [provider]  levothyroxine (SYNTHROID) 125 MCG tablet Take 1 tablet (125 mcg total) by mouth daily. 05/12/22   Raliegh Ip, DO  linaclotide (LINZESS) 72 MCG capsule Take 1 capsule (72 mcg total) by mouth daily before breakfast. 02/10/22   Delynn Flavin M, DO  lisinopril (ZESTRIL) 20 MG tablet Take 1 tablet by mouth daily.    [provider]  medroxyPROGESTERone (DEPO-PROVERA) 150 MG/ML injection INJECT 1 ML (150 MG TOTAL) INTO THE MUSCLE EVERY 3 (THREE) MONTHS 06/16/22   Raliegh Ip, DO  Melatonin 2.5 MG CHEW Chew by mouth.    [provider]  Mepolizumab (NUCALA) 100 MG/ML SOAJ Inject 1 mL (100 mg total) into the skin every 28 (twenty-eight) days. 07/03/22   Hunsucker, Lesia Sago, MD  methocarbamol (ROBAXIN) 500 MG tablet 500 mg. 06/04/22   [provider]  montelukast (SINGULAIR) 10 MG tablet TAKE 1 TABLET BY MOUTH AT BEDTIME 04/14/22   Delynn Flavin M, DO  naproxen (NAPROSYN) 500 MG tablet 500 mg. 06/08/22   [provider]  nystatin cream (MYCOSTATIN) Apply 1 Application topically 2 (two) times daily. x2 weeks 12/26/21   Delynn Flavin M, DO  pioglitazone (ACTOS) 45 MG tablet Take 1 tablet (45 mg total) by mouth daily. 05/12/22   Raliegh Ip, DO  promethazine (PHENERGAN) 25 MG tablet Take 1 tablet (25 mg total) by mouth every 6 (six) hours as needed for nausea or vomiting. 06/11/20   Myra Rude, MD  rosuvastatin (CRESTOR) 40 MG tablet Take 1 tablet (40 mg total) by mouth at bedtime. 05/12/22   Raliegh Ip, DO  sertraline (ZOLOFT) 100 MG tablet Take 200 mg by mouth daily.    [provider]  tirzepatide Greggory Keen) 12.5 MG/0.5ML Pen Inject 12.5 mg into the skin once a week. 03/24/22   Delynn Flavin M, DO  VALIUM 10 MG tablet Take 10  mg by mouth every 8 (eight) hours as needed for anxiety. Rx by [facility] 01/28/22   [provider]     Allergies  Allergen Reactions   Diphenhydramine Other (See Comments)    Hair feels like it is crawling   Morphine Nausea And Vomiting    PCA PUMP/ DRIP -- N/V IV PUSH IN ER NO PROBLEM PER PT.     Prednisone Nausea And Vomiting   Adhesive [Tape]     Paper tape causes irritation    Erythromycin Hives   Latex    Metformin And Related     Diarrhea with IR and XR products   Sulfonamide Derivatives Other (See Comments)    Does not take due to family history    History:   Past Medical History:  Diagnosis Date   Allergy  Anxiety    Arthritis    Asthma    Bipolar 1 disorder    Blood transfusion without reported diagnosis    with child birth 16 yrs ago    Bronchitis, chronic    Constipation    COVID-19 virus infection 06/21/2020   DDD (degenerative disc disease), cervical    Depression    Diabetes mellitus without complication    Dysphagia    Family history of adverse reaction to anesthesia    mother had a hard time waking up after knee surgery in 2021   Gastroparesis    GERD (gastroesophageal reflux disease)    History of kidney stones    History of MRSA infection    Hyperlipemia    Hypothyroid    Migraines    Pneumonia    Sleep apnea    no cpap      Past Surgical History:  Procedure Laterality Date   ABDOMINAL SURGERY     ANTERIOR CERVICAL DECOMP/DISCECTOMY FUSION N/A 10/09/2020   Procedure: Anterior Cervical Discectomy Fusion Cervical five-six;  Surgeon: Bedelia Person, MD;  Location: [facility] OR;  Service: Neurosurgery;  Laterality: N/A;   BIOPSY  02/04/2021   Procedure: BIOPSY;  Surgeon: Benancio Deeds, MD;  Location: [facility];  Service: Gastroenterology;;   BOTOX INJECTION N/A 02/04/2021   Procedure: BOTOX INJECTION;  Surgeon: Benancio Deeds, MD;  Location: [facility];  Service: Gastroenterology;  Laterality: N/A;   CARPAL TUNNEL  RELEASE Bilateral 1999   CARPOMETACARPEL SUSPENSION PLASTY Left 08/05/2022   Procedure: Left thumb trapeziectomy and suspensionplasty;  Surgeon: Marlyne Beards, MD;  Location: [facility];  Service: Orthopedics;  Laterality: Left;  with regional block  120   CESAREAN SECTION     CHOLECYSTECTOMY     COLONOSCOPY     ESOPHAGEAL MANOMETRY N/A 07/08/2020   Procedure: ESOPHAGEAL MANOMETRY (EM);  Surgeon: Benancio Deeds, MD;  Location: [facility];  Service: Gastroenterology;  Laterality: N/A;   ESOPHAGOGASTRODUODENOSCOPY (EGD) WITH PROPOFOL N/A 02/04/2021   Procedure: ESOPHAGOGASTRODUODENOSCOPY (EGD) WITH PROPOFOL;  Surgeon: Benancio Deeds, MD;  Location: [facility];  Service: Gastroenterology;  Laterality: N/A;   REPLACEMENT TOTAL KNEE Bilateral    SCLEROTHERAPY  02/04/2021   Procedure: SCLEROTHERAPY;  Surgeon: Benancio Deeds, MD;  Location: [facility];  Service: Gastroenterology;;   THYROIDECTOMY     TONSILLECTOMY     TONSILLECTOMY AND ADENOIDECTOMY     TRIGGER FINGER RELEASE Left 06/16/2021   Procedure: LEFT THUMB TRIGGER FINGER RELEASE;  Surgeon: Oliver Barre, MD;  Location: [facility];  Service: Orthopedics;  Laterality: Left;   TRIGGER FINGER RELEASE Right 09/29/2021   Procedure: RELEASE TRIGGER FINGER/A-1 PULLEY;  Surgeon: Oliver Barre, MD;  Location: [facility];  Service: Orthopedics;  Laterality: Right;  Right ring, index and thumb trigger finger   UPPER GASTROINTESTINAL ENDOSCOPY      Family History  Problem Relation Age of Onset   Mental illness Other    Diabetes Other    Healthy Mother    Cancer Father    Non-Hodgkin's lymphoma Father    Breast cancer Paternal Grandmother    Lung cancer Paternal Grandmother    Esophageal cancer Other    Colon cancer Neg Hx    Stomach cancer Neg Hx    Pancreatic cancer Neg Hx    Colon polyps Neg Hx     Social History   Tobacco Use   Smoking status: Former    Packs/day: 1.00    Years: 5.00  Additional  pack years: 0.00    Total pack years: 5.00    Types: Cigarettes    Quit date: 04/04/1993    Years since quitting: 29.4   Smokeless tobacco: Never   Tobacco comments:    smoked when went out with friends/social smoker. pack would last a week.  Vaping Use   Vaping Use: Former  Substance Use Topics   Alcohol use: No   Drug use: No    Review of Systems  Constitutional:  Positive for fatigue. Negative for fever.  HENT:  Positive for ear pain and sore throat. Negative for congestion, ear discharge, rhinorrhea and sinus pressure.   Respiratory:  Negative for cough.   Gastrointestinal:  Positive for nausea. Negative for abdominal pain and vomiting.  Musculoskeletal:  Positive for myalgias.  Neurological:  Positive for headaches.     Objective:   Vitals: BP (!) 140/82 (BP Location: Right Arm)   Pulse 86   Temp 98.5 F (36.9 C) (Oral)   Resp 16   SpO2 97%   Physical Exam Constitutional:      General: She is not in acute distress.    Appearance: Normal appearance. She is well-developed. She is morbidly obese. She is not ill-appearing or toxic-appearing.  HENT:     Head: Normocephalic and atraumatic.     Right Ear: Ear canal normal. A middle ear effusion is present.     Left Ear: Ear canal normal. A middle ear effusion is present.     Mouth/Throat:     Palate: Lesions present.     Pharynx: Oropharynx is clear. Uvula midline. No pharyngeal swelling, oropharyngeal exudate or posterior oropharyngeal erythema.     Tonsils: No tonsillar exudate or tonsillar abscesses.     Comments: Ulcerative lesion noted on anterior palate.  No discharge noted on exam. Cardiovascular:     Rate and Rhythm: Normal rate and regular rhythm.     Heart sounds: Normal heart sounds.  Pulmonary:     Effort: Pulmonary effort is normal.     Breath sounds: Normal breath sounds.     Comments: Clear to auscultation bilaterally  Abdominal:     General: Bowel sounds are normal.     Palpations: Abdomen is  soft.     Tenderness: There is no abdominal tenderness.  Skin:    General: Skin is warm and dry.  Neurological:     General: No focal deficit present.     Mental Status: She is alert.  Psychiatric:        Mood and Affect: Mood and affect normal.     Results:  Labs: Results for orders placed or performed during the hospital encounter of 09/06/22 (from the past 24 hour(s))  POCT rapid strep A     Status: None   Collection Time: 09/06/22  3:09 PM  Result Value Ref Range   Rapid Strep A Screen Negative Negative    Radiology: No results found.   UC Course/Treatments:  Procedures: Procedures   Medications Ordered in UC: Medications - No data to display   Assessment and Plan :     ICD-10-CM   1. Acute pharyngitis, unspecified etiology  J02.9 Culture, group A strep    Culture, group A strep    2. Oral lesion  K13.70     3. Acute otalgia, left  H92.02      Acute pharyngitis: Afebrile, nontoxic-appearing, NAD. VSS. DDX includes but not limited to: Strep, mono, postnasal drip, viral pharyngitis Strep is negative today in office.  Throat culture is pending.  Suspect viral pharyngitis versus postnasal drip.  Recommend salt water gargles.  Strict ED precautions were given and patient verbalized understanding.  Oral lesion: Afebrile, nontoxic-appearing, NAD. VSS. DDX includes but not limited to: Canker sore, malignancy, HSV On PE, oral lesion was noted.  Lesion is ulcerative like appearance suspect possible canker sore.  Recommend patient use over-the-counter topical oral anesthetic gel.  Recommend she follow-up with her dentist and/or PCP as soon as possible if no resolution for possible biopsy. Strict ED precautions were given and patient verbalized understanding.  Acute left otalgia: Afebrile, nontoxic-appearing, NAD. VSS. DDX includes but not limited to: Otitis media, otitis externa, eustachian tube dysfunction On PE, middle ear effusion was noted.  Suspect secondary to  eustachian tube dysfunction.  Recommend she continue with nasal spray and over-the-counter antihistamines.  She can take over-the-counter analgesics as needed for pain.  Strict ED precautions were given and patient verbalized understanding.  ED Discharge Orders     None        PDMP not reviewed this encounter.     Doneen Ollinger P, PA-C 09/06/22 1526

## 2022-09-06 NOTE — Discharge Instructions (Addendum)
Your strep test was negative. A throat culture was sent to the lab for further testing.  You will be called with the results of your culture.   Recommend salt water gargling. Return in 2 to 3 days if no improvement. Please go directly to the Emergency Department immediately should you begin to have any of the following symptoms: increased pain, persistent fevers, difficulty swallowing, difficulty talking, drooling or difficulty breathing.

## 2022-09-06 NOTE — ED Triage Notes (Signed)
HA, N and sore throat x 1 week and left ear is starting to bother her.

## 2022-09-08 ENCOUNTER — Ambulatory Visit: Payer: Medicaid Other

## 2022-09-08 DIAGNOSIS — M79645 Pain in left finger(s): Secondary | ICD-10-CM | POA: Diagnosis not present

## 2022-09-10 ENCOUNTER — Ambulatory Visit: Payer: Medicaid Other

## 2022-09-11 ENCOUNTER — Ambulatory Visit (INDEPENDENT_AMBULATORY_CARE_PROVIDER_SITE_OTHER): Payer: Medicaid Other

## 2022-09-11 DIAGNOSIS — Z3042 Encounter for surveillance of injectable contraceptive: Secondary | ICD-10-CM | POA: Diagnosis not present

## 2022-09-11 NOTE — Progress Notes (Signed)
Medroxyprogesterone injection given to left upper outer quadrant.  Patient tolerated well. 

## 2022-09-14 ENCOUNTER — Telehealth (HOSPITAL_COMMUNITY): Payer: Self-pay | Admitting: Emergency Medicine

## 2022-09-14 NOTE — Telephone Encounter (Signed)
This RN informed we will not be receiving a result for patient's throat culture from recent visit. Attempted to reach patient x 1 to check on her and review, LVM

## 2022-09-17 ENCOUNTER — Other Ambulatory Visit: Payer: Self-pay | Admitting: Family Medicine

## 2022-09-17 ENCOUNTER — Telehealth: Payer: Self-pay | Admitting: Gastroenterology

## 2022-09-17 ENCOUNTER — Other Ambulatory Visit: Payer: Self-pay | Admitting: Gastroenterology

## 2022-09-17 DIAGNOSIS — Z794 Long term (current) use of insulin: Secondary | ICD-10-CM

## 2022-09-17 LAB — HM DIABETES EYE EXAM

## 2022-09-17 MED ORDER — DEXLANSOPRAZOLE 60 MG PO CPDR: 60.0000 mg | DELAYED_RELEASE_CAPSULE | Freq: Every day | ORAL | 0 refills | Status: DC

## 2022-09-17 NOTE — Telephone Encounter (Signed)
Inbound call from patient would like refill Dexilant, patient is aware she canceled her previous appointment for 4/8. She is now scheduled for 7/2 with Dr. Adela Lank.

## 2022-09-17 NOTE — Telephone Encounter (Signed)
Refill sent to pharmacy for 90 days to get to July appointment. Patient must keep that appointment for further refills.

## 2022-09-18 DIAGNOSIS — M180 Bilateral primary osteoarthritis of first carpometacarpal joints: Secondary | ICD-10-CM | POA: Diagnosis not present

## 2022-09-18 DIAGNOSIS — T8141XA Infection following a procedure, superficial incisional surgical site, initial encounter: Secondary | ICD-10-CM | POA: Diagnosis not present

## 2022-09-18 DIAGNOSIS — M1812 Unilateral primary osteoarthritis of first carpometacarpal joint, left hand: Secondary | ICD-10-CM | POA: Diagnosis not present

## 2022-09-25 ENCOUNTER — Other Ambulatory Visit: Payer: Self-pay

## 2022-09-25 ENCOUNTER — Other Ambulatory Visit (HOSPITAL_COMMUNITY): Payer: Self-pay

## 2022-09-29 ENCOUNTER — Other Ambulatory Visit (HOSPITAL_COMMUNITY): Payer: Self-pay

## 2022-10-05 ENCOUNTER — Other Ambulatory Visit: Payer: Self-pay

## 2022-10-05 MED ORDER — SUMATRIPTAN SUCCINATE 4 MG/0.5ML ~~LOC~~ SOCT: 4.0000 mg | Freq: Once | SUBCUTANEOUS | 0 refills | Status: DC

## 2022-10-13 ENCOUNTER — Encounter (INDEPENDENT_AMBULATORY_CARE_PROVIDER_SITE_OTHER): Payer: Medicaid Other | Admitting: Family Medicine

## 2022-10-13 ENCOUNTER — Other Ambulatory Visit: Payer: Self-pay | Admitting: Family Medicine

## 2022-10-13 ENCOUNTER — Telehealth: Payer: Self-pay | Admitting: Family Medicine

## 2022-10-13 DIAGNOSIS — F988 Other specified behavioral and emotional disorders with onset usually occurring in childhood and adolescence: Secondary | ICD-10-CM

## 2022-10-13 DIAGNOSIS — F39 Unspecified mood [affective] disorder: Secondary | ICD-10-CM | POA: Diagnosis not present

## 2022-10-13 DIAGNOSIS — E1169 Type 2 diabetes mellitus with other specified complication: Secondary | ICD-10-CM

## 2022-10-13 DIAGNOSIS — J4540 Moderate persistent asthma, uncomplicated: Secondary | ICD-10-CM

## 2022-10-13 MED ORDER — TIRZEPATIDE 15 MG/0.5ML ~~LOC~~ SOAJ: 15.0000 mg | SUBCUTANEOUS | 3 refills | Status: DC

## 2022-10-13 NOTE — Telephone Encounter (Signed)
sent 

## 2022-10-14 NOTE — Telephone Encounter (Signed)

## 2022-10-20 ENCOUNTER — Telehealth: Payer: Self-pay | Admitting: Family Medicine

## 2022-10-20 DIAGNOSIS — Z794 Long term (current) use of insulin: Secondary | ICD-10-CM

## 2022-10-21 ENCOUNTER — Other Ambulatory Visit: Payer: Self-pay

## 2022-10-21 ENCOUNTER — Other Ambulatory Visit (HOSPITAL_COMMUNITY): Payer: Self-pay

## 2022-10-21 ENCOUNTER — Other Ambulatory Visit: Payer: Self-pay | Admitting: Family Medicine

## 2022-10-21 NOTE — Telephone Encounter (Signed)
Left detailed vm of reason for medication not being filled

## 2022-10-21 NOTE — Telephone Encounter (Signed)
Please inform pt of insurance requirements.  She requested this med and has not been seen for this issues in years.  So we will need to discuss at her appt in 1 weeks.

## 2022-10-21 NOTE — Telephone Encounter (Signed)
Referral in

## 2022-10-21 NOTE — Telephone Encounter (Signed)
Initiated PA via CMM. Patient must have tried and failed 2 formulary preferred medications - Rizatriptan tablet/ODT and Sumatriptan nasal spray/tablet/vial). No indication that either has been tried.

## 2022-10-21 NOTE — Telephone Encounter (Signed)
This rx is from [place]. Please reroute to them

## 2022-10-21 NOTE — Telephone Encounter (Signed)
Per Penne Lash, PCP needs to place new [phone] order before she can schedule the appt.

## 2022-10-21 NOTE — Telephone Encounter (Signed)
Pt wants to make an appt to see Raynelle Fanning regarding Mounjaro.

## 2022-10-22 ENCOUNTER — Other Ambulatory Visit: Payer: Self-pay | Admitting: Pharmacist

## 2022-10-22 ENCOUNTER — Other Ambulatory Visit: Payer: Self-pay

## 2022-10-22 ENCOUNTER — Other Ambulatory Visit (HOSPITAL_COMMUNITY): Payer: Self-pay

## 2022-10-22 DIAGNOSIS — Z794 Long term (current) use of insulin: Secondary | ICD-10-CM

## 2022-10-22 MED ORDER — TIRZEPATIDE 12.5 MG/0.5ML ~~LOC~~ SOAJ
12.5000 mg | SUBCUTANEOUS | 3 refills | Status: DC
  Filled 2022-10-22 – 2022-11-13 (×2): qty 2, 28d supply, fill #0
  Filled 2022-12-09: qty 2, 28d supply, fill #1
  Filled 2023-02-08: qty 2, 28d supply, fill #2

## 2022-10-22 MED ORDER — MOUNJARO 15 MG/0.5ML ~~LOC~~ SOAJ
15.0000 mg | SUBCUTANEOUS | 3 refills | Status: DC
  Filled 2022-10-22 – 2023-01-15 (×2): qty 2, 28d supply, fill #0
  Filled 2023-02-15: qty 2, 28d supply, fill #1
  Filled 2023-03-12: qty 2, 28d supply, fill #2
  Filled 2023-04-09: qty 2, 28d supply, fill #3
  Filled 2023-05-05 – 2023-06-11 (×11): qty 2, 28d supply, fill #4

## 2022-10-22 NOTE — Progress Notes (Signed)
    10/22/2022 Name: Judith [name] MRN: [id number] DOB: [birth year]   S:  52 yoF Presents for diabetes evaluation, education, and management She reports difficulty finding Mounjaro due to shortage.  She has lost 46lbs on this therapy and her sugars remain controlled  Insurance coverage/medication affordability: [place] med[place]  Patient reports adherence with medications. Current diabetes medications include: Mammie Russian 15mg  weekly Current hypertension medications include: lisinopril Goal 130/80 Current hyperlipidemia medications include: rosuvastatin   Patient denies hypoglycemic events.   Discussed meal planning options and Plate method for healthy eating Avoid sugary drinks and desserts Incorporate balanced protein, non starchy veggies, 1 serving of carbohydrate with each meal Increase water intake Increase physical activity as able   Patient-reported exercise habits: n/a    O:  Lab Results  Component Value Date   HGBA1C 6.7 (H) 05/08/2022    Lipid Panel     Component Value Date/Time   CHOL 148 05/08/2022 1333   TRIG 206 (H) 05/08/2022 1333   HDL 29 (L) 05/08/2022 1333   CHOLHDL 5.1 (H) 05/08/2022 1333   LDLCALC 84 05/08/2022 1333   Clinical Atherosclerotic Cardiovascular Disease (ASCVD): No   The 10-year ASCVD risk score (Arnett DK, et al., 2019) is: 6.5%   Values used to calculate the score:     Age: 52 years     Sex: Female     Is Non-Hispanic African American: No     Diabetic: Yes     Tobacco smoker: No     Systolic Blood Pressure: 140 mmHg     Is BP treated: Yes     HDL Cholesterol: 29 mg/dL     Total Cholesterol: 148 mg/dL    A/P:  Diabetes [id number] with improved glycemic control-->A1c now 6.7% currently CONTROLLED. She continues wearing her freestyle libre 3 CGM.  Her blood sugars remain within normal limits.    Past T2DM intolerances: trulicty and ozempic  (GI distress); She has tried rybelsus and tolerated it, however it did not support her  weight loss endeavors    -Decrease to [facility] 12.5mg  due to availability.  Sent to [facility] outpt to mail per patient request Patient denies side effects with Mounjaro weekly;  She is tolerating well and would like to continue increasing her dose to support glycemic control, hopefully decrease insulin requirements and support weight loss -REPORTS 46 lb weight loss  -remains motivated -no hypoglycemia, will work to adjust insulin as needed             -Denies personal and family history of Medullary thyroid cancer (MTC)   -Continue Tresiba as prescirbed, may look at decreasing dose as needed  -Pioglitazone was discontinued last year 2023   -Extensively discussed pathophysiology of diabetes, recommended lifestyle interventions, dietary effects on blood sugar control   -Counseled on s/sx of and management of hypoglycemia   -Next A1C anticipated 11/23.    Written patient instructions provided.  Total time in counseling 25 minutes.      Kieth Brightly, PharmD, BCACP Clinical Pharmacist, [facility]  II Phone [phone]

## 2022-10-26 ENCOUNTER — Other Ambulatory Visit (HOSPITAL_COMMUNITY): Payer: Self-pay

## 2022-10-28 ENCOUNTER — Other Ambulatory Visit: Payer: Self-pay

## 2022-11-02 ENCOUNTER — Ambulatory Visit: Payer: Medicaid Other | Admitting: Family Medicine

## 2022-11-02 VITALS — BP 99/70 | HR 86 | Temp 97.9°F | Ht 67.0 in | Wt 275.0 lb

## 2022-11-02 DIAGNOSIS — E118 Type 2 diabetes mellitus with unspecified complications: Secondary | ICD-10-CM | POA: Diagnosis not present

## 2022-11-02 DIAGNOSIS — M5136 Other intervertebral disc degeneration, lumbar region: Secondary | ICD-10-CM

## 2022-11-02 DIAGNOSIS — M5030 Other cervical disc degeneration, unspecified cervical region: Secondary | ICD-10-CM | POA: Diagnosis not present

## 2022-11-02 DIAGNOSIS — Z1159 Encounter for screening for other viral diseases: Secondary | ICD-10-CM

## 2022-11-02 DIAGNOSIS — M51369 Other intervertebral disc degeneration, lumbar region without mention of lumbar back pain or lower extremity pain: Secondary | ICD-10-CM

## 2022-11-02 DIAGNOSIS — E039 Hypothyroidism, unspecified: Secondary | ICD-10-CM

## 2022-11-02 DIAGNOSIS — E785 Hyperlipidemia, unspecified: Secondary | ICD-10-CM | POA: Diagnosis not present

## 2022-11-02 DIAGNOSIS — E1159 Type 2 diabetes mellitus with other circulatory complications: Secondary | ICD-10-CM

## 2022-11-02 DIAGNOSIS — E1169 Type 2 diabetes mellitus with other specified complication: Secondary | ICD-10-CM

## 2022-11-02 DIAGNOSIS — J3489 Other specified disorders of nose and nasal sinuses: Secondary | ICD-10-CM | POA: Diagnosis not present

## 2022-11-02 DIAGNOSIS — I152 Hypertension secondary to endocrine disorders: Secondary | ICD-10-CM

## 2022-11-02 DIAGNOSIS — Z794 Long term (current) use of insulin: Secondary | ICD-10-CM | POA: Diagnosis not present

## 2022-11-02 LAB — BAYER DCA HB A1C WAIVED: HB A1C (BAYER DCA - WAIVED): 6.5 % — ABNORMAL HIGH (ref 4.8–5.6)

## 2022-11-02 MED ORDER — AZELASTINE HCL 0.1 % NA SOLN
1.0000 | Freq: Two times a day (BID) | NASAL | 12 refills | Status: DC
Start: 2022-11-02 — End: 2023-11-22

## 2022-11-02 MED ORDER — TRESIBA FLEXTOUCH 200 UNIT/ML ~~LOC~~ SOPN
50.0000 [IU] | PEN_INJECTOR | Freq: Every day | SUBCUTANEOUS | 0 refills | Status: DC
Start: 2022-11-02 — End: 2023-02-09

## 2022-11-02 MED ORDER — LEVOTHYROXINE SODIUM 125 MCG PO TABS
125.0000 ug | ORAL_TABLET | Freq: Every day | ORAL | 3 refills | Status: DC
Start: 2022-11-02 — End: 2023-08-20

## 2022-11-02 MED ORDER — FREESTYLE LIBRE 3 SENSOR MISC
99 refills | Status: DC
Start: 2022-11-02 — End: 2023-02-04

## 2022-11-02 MED ORDER — MEDROXYPROGESTERONE ACETATE 150 MG/ML IM SUSP: INTRAMUSCULAR | 2 refills | Status: DC

## 2022-11-02 MED ORDER — GABAPENTIN 300 MG PO CAPS
ORAL_CAPSULE | ORAL | 3 refills | Status: DC
Start: 2022-11-02 — End: 2023-06-30

## 2022-11-02 MED ORDER — PIOGLITAZONE HCL 45 MG PO TABS: 45.0000 mg | ORAL_TABLET | Freq: Every day | ORAL | 3 refills | Status: DC

## 2022-11-02 MED ORDER — ROSUVASTATIN CALCIUM 40 MG PO TABS: 40.0000 mg | ORAL_TABLET | Freq: Every day | ORAL | 3 refills | Status: DC

## 2022-11-02 NOTE — Progress Notes (Signed)
Subjective: CC:DM PCP: Raliegh Ip, DO HPI:Judith [name] is a 52 y.o. female presenting to clinic today for:  1. Type 2 Diabetes with hypertension, hyperlipidemia:  Currently on the 12.5mg  of Mounjaro due to manufacturer shortages.  Average blood sugars have been running around 134.  Her CGM states that her A1c should be around 6-1/2.  She is compliant with all other medications as prescribed.  Last eye exam: Up-to-date.  Had completed earlier this year.  No retinal concerns Last foot exam: Needs Last A1c:  Lab Results  Component Value Date   HGBA1C 6.7 (H) 05/08/2022   Nephropathy screen indicated?:  Up-to-date Last flu, zoster and/or pneumovax:  Immunization History  Administered Date(s) Administered   Influenza, Seasonal, Injecte, Preservative Fre 02/09/2018   Influenza,inj,Quad PF,6+ Mos 02/14/2017, 02/20/2019   Influenza-Unspecified 02/05/2020, 01/21/2021   MMR 02/03/1972   Moderna Sars-Covid-2 Vaccination 10/02/2019, 11/01/2019, 04/18/2020, 11/26/2020   PNEUMOCOCCAL CONJUGATE-20 11/26/2020   Pneumococcal Polysaccharide-23 02/20/2019, 11/01/2019   Td 03/03/1985, 06/30/1994   Tdap 12/24/2014, 11/26/2020   Zoster Recombinat (Shingrix) 02/05/2020    ROS: No chest pain or shortness of breath reported.  Her joints feel better with the weight loss from the [facility].  2. Hypothyroidism Compliant with medications.  No reports of tremor, heart palpitations.  She continues to struggle with mood but has her first appointment with her psychiatrist tomorrow.  She will be seeing a specialist at [facility] in [place].   ROS: Per HPI  Allergies  Allergen Reactions   Diphenhydramine Other (See Comments)    Hair feels like it is crawling   Morphine Nausea And Vomiting    PCA PUMP/ DRIP -- N/V IV PUSH IN ER NO PROBLEM PER PT.     Prednisone Nausea And Vomiting   Adhesive [Tape]     Paper tape causes irritation    Erythromycin Hives   Latex    Metformin And Related      Diarrhea with IR and XR products   Sulfonamide Derivatives Other (See Comments)    Does not take due to family history   Past Medical History:  Diagnosis Date   Allergy    Anxiety    Arthritis    Asthma    Bipolar 1 disorder (HCC)    Blood transfusion without reported diagnosis    with child birth 16 yrs ago    Bronchitis, chronic (HCC)    Constipation    COVID-19 virus infection 06/21/2020   DDD (degenerative disc disease), cervical    Depression    Diabetes mellitus without complication (HCC)    Dysphagia    Family history of adverse reaction to anesthesia    mother had a hard time waking up after knee surgery in 2021   Gastroparesis    GERD (gastroesophageal reflux disease)    History of kidney stones    History of MRSA infection    Hyperlipemia    Hypothyroid    Migraines    Pneumonia    Sleep apnea    no cpap     Current Outpatient Medications:    Accu-Chek FastClix Lancets MISC, Test blood sugars daily Dx E11.9, Disp: 102 each, Rfl: 3   ACCU-CHEK GUIDE test strip, TEST BLOOD SUGARS DAILY DX E11.9, Disp: 100 strip, Rfl: 3   albuterol (PROVENTIL) (2.5 MG/3ML) 0.083% nebulizer solution, Take 3 mLs (2.5 mg total) by nebulization every 6 (six) hours as needed for wheezing or shortness of breath. Fill upon patient request, Disp: 75 mL, Rfl: 12  albuterol (VENTOLIN HFA) 108 (90 Base) MCG/ACT inhaler, Inhale 2 puffs into the lungs every 6 (six) hours as needed for wheezing or shortness of breath. Fill upon patient request, Disp: 8 g, Rfl: 1   azelastine (ASTELIN) 0.1 % nasal spray, Place 1 spray into both nostrils 2 (two) times daily., Disp: 30 mL, Rfl: 12   Blood Glucose Monitoring Suppl (ACCU-CHEK GUIDE ME) w/Device KIT, Use to check BS BID and as needed. DX: E11.9, Disp: 1 kit, Rfl: 0   budesonide-formoterol (SYMBICORT) 160-4.5 MCG/ACT inhaler, Inhale 2 puffs into the lungs 2 (two) times daily. (Patient taking differently: Inhale 2 puffs into the lungs 2 (two) times daily  as needed (shortness of breath).), Disp: 1 each, Rfl: 3   cetirizine (ZYRTEC) 10 MG tablet, Take 10 mg by mouth daily as needed for allergies., Disp: , Rfl:    Continuous Blood Gluc Sensor (FREESTYLE LIBRE 3 SENSOR) MISC, Place 1 sensor on the skin every 14 days. Use to check glucose continuously; DX:E11.65, Disp: 6 each, Rfl: PRN   cyclobenzaprine (FLEXERIL) 10 MG tablet, Take 1 tablet 3 times a day by oral route as needed., Disp: , Rfl:    dexlansoprazole (DEXILANT) 60 MG capsule, Take 1 capsule (60 mg total) by mouth daily. Please keep your July appointment  for any further refills., Disp: 90 capsule, Rfl: 0   diclofenac Sodium (VOLTAREN) 1 % GEL, APPLY 2 GRAMS TO AFFECTED AREA 4 TIMES A DAY, Disp: 400 g, Rfl: 0   dicyclomine (BENTYL) 20 MG tablet, TAKE 1 TABLET (20 MG TOTAL) BY MOUTH EVERY 6 (SIX) HOURS. (Patient taking differently: Take 20 mg by mouth every 6 (six) hours as needed for spasms (abdominal cramps).), Disp: 360 tablet, Rfl: 0   Docusate Sodium (DSS) 100 MG CAPS, Take 1 capsule by mouth 2 (two) times daily., Disp: , Rfl:    gabapentin (NEURONTIN) 300 MG capsule, TAKE 1 CAPSULE BY MOUTH THREE TIMES A DAY, Disp: 270 capsule, Rfl: 3   hydrOXYzine (ATARAX) 10 MG tablet, Take 10 mg by mouth 3 (three) times daily as needed., Disp: , Rfl:    insulin degludec (TRESIBA FLEXTOUCH) 200 UNIT/ML FlexTouch Pen, INJECT 50-60 UNITS INTO THE SKIN AT BEDTIME., Disp: 18 mL, Rfl: 0   Insulin Pen Needle (B-D UF III MINI PEN NEEDLES) 31G X 5 MM MISC, use as directed once daily Dx E11.8, Disp: 100 each, Rfl: 4   levothyroxine (SYNTHROID) 125 MCG tablet, Take 1 tablet (125 mcg total) by mouth daily., Disp: 90 tablet, Rfl: 3   linaclotide (LINZESS) 72 MCG capsule, Take 1 capsule (72 mcg total) by mouth daily before breakfast., Disp: 30 capsule, Rfl: 3   lisinopril (ZESTRIL) 20 MG tablet, Take 1 tablet by mouth daily., Disp: , Rfl:    medroxyPROGESTERone (DEPO-PROVERA) 150 MG/ML injection, INJECT 1 ML (150 MG  TOTAL) INTO THE MUSCLE EVERY 3 (THREE) MONTHS, Disp: 1 mL, Rfl: 2   Melatonin 2.5 MG CHEW, Chew by mouth., Disp: , Rfl:    Mepolizumab (NUCALA) 100 MG/ML SOAJ, Inject 1 mL (100 mg total) into the skin every 28 (twenty-eight) days., Disp: 3 mL, Rfl: 1   montelukast (SINGULAIR) 10 MG tablet, TAKE 1 TABLET BY MOUTH EVERYDAY AT BEDTIME, Disp: 90 tablet, Rfl: 1   nystatin cream (MYCOSTATIN), Apply 1 Application topically 2 (two) times daily. x2 weeks, Disp: 60 g, Rfl: 0   pioglitazone (ACTOS) 45 MG tablet, Take 1 tablet (45 mg total) by mouth daily., Disp: 90 tablet, Rfl: 3   rosuvastatin (  CRESTOR) 40 MG tablet, Take 1 tablet (40 mg total) by mouth at bedtime., Disp: 90 tablet, Rfl: 3   sertraline (ZOLOFT) 100 MG tablet, Take 200 mg by mouth daily., Disp: , Rfl:    SUMAtriptan Succinate 4 MG/0.5ML SOCT, Inject 4 mg into the skin once for 1 dose., Disp: 9 mL, Rfl: 0   tirzepatide (MOUNJARO) 12.5 MG/0.5ML Pen, Inject 12.5 mg into the skin once a week., Disp: 6 mL, Rfl: 3   tirzepatide (MOUNJARO) 15 MG/0.5ML Pen, Inject 15 mg into the skin once weekly, Disp: 6 mL, Rfl: 3   VALIUM 10 MG tablet, Take 10 mg by mouth every 8 (eight) hours as needed for anxiety. Rx by [employer], Disp: , Rfl:  Social History   Socioeconomic History   Marital status: Divorced    Spouse name: Not on file   Number of children: Not on file   Years of education: Not on file   Highest education level: Some college, no degree  Occupational History   Not on file  Tobacco Use   Smoking status: Former    Packs/day: 1.00    Years: 5.00    Additional pack years: 0.00    Total pack years: 5.00    Types: Cigarettes    Quit date: 04/04/1993    Years since quitting: 29.6   Smokeless tobacco: Never   Tobacco comments:    smoked when went out with friends/social smoker. pack would last a week.  Vaping Use   Vaping Use: Former  Substance and Sexual Activity   Alcohol use: No   Drug use: No   Sexual activity: Yes    Birth  control/protection: I.U.D.  Other Topics Concern   Not on file  Social History Narrative   Not on file   Social Determinants of Health   Financial Resource Strain: High Risk (11/02/2022)   Overall Financial Resource Strain (CARDIA)    Difficulty of Paying Living Expenses: Very hard  Food Insecurity: No Food Insecurity (11/02/2022)   Hunger Vital Sign    Worried About Running Out of Food in the Last Year: Never true    Ran Out of Food in the Last Year: Never true  Transportation Needs: No Transportation Needs (11/02/2022)   PRAPARE - Administrator, Civil Service (Medical): No    Lack of Transportation (Non-Medical): No  Physical Activity: Unknown (11/02/2022)   Exercise Vital Sign    Days of Exercise per Week: 0 days    Minutes of Exercise per Session: Not on file  Stress: Stress Concern Present (11/02/2022)   [employer] of Occupational Health - Occupational Stress Questionnaire    Feeling of Stress : Rather much  Social Connections: Socially Isolated (11/02/2022)   Social Connection and Isolation Panel [NHANES]    Frequency of Communication with Friends and Family: More than three times a week    Frequency of Social Gatherings with Friends and Family: Never    Attends Religious Services: Never    Database administrator or Organizations: No    Attends Engineer, structural: Not on file    Marital Status: Divorced  Catering manager Violence: Not on file   Family History  Problem Relation Age of Onset   Mental illness Other    Diabetes Other    Healthy Mother    Cancer Father    Non-Hodgkin's lymphoma Father    Breast cancer Paternal Grandmother    Lung cancer Paternal Grandmother    Esophageal cancer Other  Colon cancer Neg Hx    Stomach cancer Neg Hx    Pancreatic cancer Neg Hx    Colon polyps Neg Hx     Objective: Office vital signs reviewed. BP 99/70   Pulse 86   Temp 97.9 F (36.6 C)   Ht 5\' 7"  (1.702 m)   Wt 275 lb (124.7 kg)    SpO2 97%   BMI 43.07 kg/m   Physical Examination:  General: Awake, alert, morbidly obese, No acute distress HEENT: sclera white, MMM Cardio: regular rate and rhythm, S1S2 heard, no murmurs appreciated Pulm: clear to auscultation bilaterally, no wheezes, rhonchi or rales; normal work of breathing on room air Neuro: see DM foot Diabetic Foot Exam - Simple   Simple Foot Form Diabetic Foot exam was performed with the following findings: Yes 11/02/2022  4:48 PM  Visual Inspection No deformities, no ulcerations, no other skin breakdown bilaterally: Yes Sensation Testing Intact to touch and monofilament testing bilaterally: Yes Pulse Check Posterior Tibialis and Dorsalis pulse intact bilaterally: Yes Comments      Assessment/ Plan: 52 y.o. female   Controlled type 2 diabetes mellitus with complication, with long-term current use of insulin (HCC) - Plan: Bayer DCA Hb A1c Waived, Continuous Glucose Sensor (FREESTYLE LIBRE 3 SENSOR) MISC, insulin degludec (TRESIBA FLEXTOUCH) 200 UNIT/ML FlexTouch Pen, pioglitazone (ACTOS) 45 MG tablet  Hypertension associated with diabetes (HCC) - Plan: CMP14+EGFR  Hyperlipidemia associated with type 2 diabetes mellitus (HCC) - Plan: CMP14+EGFR, rosuvastatin (CRESTOR) 40 MG tablet  Acquired hypothyroidism - Plan: TSH, T4, Free, levothyroxine (SYNTHROID) 125 MCG tablet  Encounter for hepatitis C screening test for low risk patient - Plan: Hepatitis C antibody  Rhinorrhea - Plan: azelastine (ASTELIN) 0.1 % nasal spray  DDD (degenerative disc disease), cervical - Plan: gabapentin (NEURONTIN) 300 MG capsule  DDD (degenerative disc disease), lumbar - Plan: gabapentin (NEURONTIN) 300 MG capsule  Sugar well-controlled with A1c of 6.5.  Continue GIP as prescribed.  All medications have been renewed.  Check labs.  Release of information form completed for diabetic eye exam  Asymptomatic from a thyroid standpoint.  Check thyroid levels.  Synthroid  renewed  Did not discuss rhinorrhea or DDD but needed refills of these items have been sent to pharmacy    No orders of the defined types were placed in this encounter.  No orders of the defined types were placed in this encounter.    Raliegh Ip, DO [facility] [phone]

## 2022-11-02 NOTE — Patient Instructions (Signed)
Our records indicate that you are due for your screening mammogram.  Please call the imaging center that does your yearly mammograms to make an appointment for a mammogram at your earliest convenience. Our office also has a mobile unit through the [facility] that comes to our location. Please call our office if you would like to make an appointment.   

## 2022-11-03 ENCOUNTER — Encounter: Payer: Self-pay | Admitting: Family Medicine

## 2022-11-03 LAB — T4, FREE: Free T4: 1.36 ng/dL (ref 0.82–1.77)

## 2022-11-03 LAB — CMP14+EGFR
ALT: 10 IU/L (ref 0–32)
AST: 12 IU/L (ref 0–40)
Albumin: 4.1 g/dL (ref 3.8–4.9)
Alkaline Phosphatase: 97 IU/L (ref 44–121)
BUN/Creatinine Ratio: 16 (ref 9–23)
BUN: 14 mg/dL (ref 6–24)
Bilirubin Total: 0.2 mg/dL (ref 0.0–1.2)
CO2: 20 mmol/L (ref 20–29)
Calcium: 8.9 mg/dL (ref 8.7–10.2)
Chloride: 105 mmol/L (ref 96–106)
Creatinine, Ser: 0.88 mg/dL (ref 0.57–1.00)
Globulin, Total: 2.5 g/dL (ref 1.5–4.5)
Glucose: 173 mg/dL — ABNORMAL HIGH (ref 70–99)
Potassium: 4.2 mmol/L (ref 3.5–5.2)
Sodium: 139 mmol/L (ref 134–144)
Total Protein: 6.6 g/dL (ref 6.0–8.5)
eGFR: 79 mL/min/{1.73_m2} (ref >59)

## 2022-11-03 LAB — HEPATITIS C ANTIBODY: Hep C Virus Ab: NONREACTIVE

## 2022-11-03 LAB — TSH: TSH: 0.061 u[IU]/mL — ABNORMAL LOW (ref 0.450–4.500)

## 2022-11-05 ENCOUNTER — Other Ambulatory Visit: Payer: Self-pay | Admitting: Family Medicine

## 2022-11-05 DIAGNOSIS — Z1231 Encounter for screening mammogram for malignant neoplasm of breast: Secondary | ICD-10-CM

## 2022-11-10 ENCOUNTER — Encounter: Payer: Self-pay | Admitting: Family Medicine

## 2022-11-10 ENCOUNTER — Ambulatory Visit: Payer: Medicaid Other | Admitting: Family Medicine

## 2022-11-10 VITALS — BP 118/74 | HR 85 | Ht 67.0 in | Wt 273.0 lb

## 2022-11-10 DIAGNOSIS — F988 Other specified behavioral and emotional disorders with onset usually occurring in childhood and adolescence: Secondary | ICD-10-CM | POA: Diagnosis not present

## 2022-11-10 DIAGNOSIS — Z79899 Other long term (current) drug therapy: Secondary | ICD-10-CM

## 2022-11-10 DIAGNOSIS — Z23 Encounter for immunization: Secondary | ICD-10-CM

## 2022-11-10 DIAGNOSIS — G43009 Migraine without aura, not intractable, without status migrainosus: Secondary | ICD-10-CM | POA: Diagnosis not present

## 2022-11-10 MED ORDER — NURTEC 75 MG PO TBDP
ORAL_TABLET | ORAL | 99 refills | Status: DC
Start: 2022-11-10 — End: 2023-08-20

## 2022-11-10 MED ORDER — AMPHETAMINE-DEXTROAMPHET ER 20 MG PO CP24
20.0000 mg | ORAL_CAPSULE | Freq: Every morning | ORAL | 0 refills | Status: DC
Start: 2022-11-10 — End: 2022-12-07

## 2022-11-10 NOTE — Addendum Note (Signed)
Addended by: Dorene Sorrow on: 11/10/2022 10:34 AM   Modules accepted: Orders

## 2022-11-10 NOTE — Patient Instructions (Signed)
Controlled Substance Guidelines:  °1. You cannot get an early refill, even it is lost.  °2. You cannot get controlled medications from any other doctor, unless it is the emergency department and related to a new problem or injury.  °3. You cannot use alcohol, marijuana, cocaine or any other recreational drugs while using this medication. This is very dangerous.  °4. You are willing to have your urine drug tested at each visit.  °5. You will not drive while using this medication, because that can put yourself and others in serious danger of an accident. °6. If any medication is stolen, then there must be a police report to verify it, or it cannot be refilled.  °7. I will not prescribe these medications for longer than 3 months.  °8. You must bring your pill bottle to each visit.  °9. You must use the same pharmacy for all refills for the medication, unless you clear it with me beforehand.  °10. You cannot share or sell this medication.  ° ° °

## 2022-11-10 NOTE — Progress Notes (Signed)
Subjective: WG:NFAO PCP: Raliegh Ip, DO HPI:Judith [name] is a 52 y.o. female presenting to clinic today for:  1. ADHD Patient was diagnosed many years ago by her previous PCP.  She is now under the care of psychiatry with [facility] and they confirmed diagnosis but do not manage ADHD meds.  She was recently taken off of Zoloft 200mg  and transitioned to Prozac 20mg  daily.  Too soon to tell how helpful this will be. For ADHD, she has been treated with Adderall IR (didn't last long enough) and Vyvanse (stopped working after a number of years).  Her son was treated for ADHD when he was a teenager and Adderall extended release seem to work well for him.  She has not ever been treated with that but would be willing to try it.  She is no longer taking Valium.  She does admit to smoking THC-a through a vape pen.  This is a legal substance here in the state [place].  She admits to having smoked marijuana a few weeks ago but does not typically smoke this substance, nor does she indulge in any other types of illicit substances  2.  Migraine headaches Unfortunately the Imitrex injection is no longer covered under her insurance.  This has worked worked well for her in the past.  She notes that the oral caused her to be too sedated and one of the alternative triptans was simply ineffective.  She has never tried Nurtec but would be willing to.  Does not report any aura associated with migraine headaches.  Does not report migraine headaches occurring more than 15 days/month.  ROS: Per HPI  Allergies  Allergen Reactions   Diphenhydramine Other (See Comments)    Hair feels like it is crawling   Morphine Nausea And Vomiting    PCA PUMP/ DRIP -- N/V IV PUSH IN ER NO PROBLEM PER PT.     Prednisone Nausea And Vomiting   Adhesive [Tape]     Paper tape causes irritation    Erythromycin Hives   Latex    Metformin And Related     Diarrhea with IR and XR products   Sulfonamide Derivatives Other (See  Comments)    Does not take due to family history   Past Medical History:  Diagnosis Date   ADD (attention deficit disorder) without hyperactivity    Allergy    Anxiety    Arthritis    Asthma    Bipolar 1 disorder (HCC)    Blood transfusion without reported diagnosis    with child birth 16 yrs ago    Bronchitis, chronic (HCC)    Constipation    COVID-19 virus infection 06/21/2020   DDD (degenerative disc disease), cervical    Depression    Diabetes mellitus without complication (HCC)    Dysphagia    Family history of adverse reaction to anesthesia    mother had a hard time waking up after knee surgery in 2021   Gastroparesis    GERD (gastroesophageal reflux disease)    History of kidney stones    History of MRSA infection    Hyperlipemia    Hypothyroid    Migraines    Pneumonia    Sleep apnea    no cpap     Current Outpatient Medications:    Accu-Chek FastClix Lancets MISC, Test blood sugars daily Dx E11.9, Disp: 102 each, Rfl: 3   ACCU-CHEK GUIDE test strip, TEST BLOOD SUGARS DAILY DX E11.9, Disp: 100 strip, Rfl: 3  albuterol (PROVENTIL) (2.5 MG/3ML) 0.083% nebulizer solution, Take 3 mLs (2.5 mg total) by nebulization every 6 (six) hours as needed for wheezing or shortness of breath. Fill upon patient request, Disp: 75 mL, Rfl: 12   albuterol (VENTOLIN HFA) 108 (90 Base) MCG/ACT inhaler, Inhale 2 puffs into the lungs every 6 (six) hours as needed for wheezing or shortness of breath. Fill upon patient request, Disp: 8 g, Rfl: 1   azelastine (ASTELIN) 0.1 % nasal spray, Place 1 spray into both nostrils 2 (two) times daily., Disp: 30 mL, Rfl: 12   Blood Glucose Monitoring Suppl (ACCU-CHEK GUIDE ME) w/Device KIT, Use to check BS BID and as needed. DX: E11.9, Disp: 1 kit, Rfl: 0   budesonide-formoterol (SYMBICORT) 160-4.5 MCG/ACT inhaler, Inhale 2 puffs into the lungs 2 (two) times daily. (Patient taking differently: Inhale 2 puffs into the lungs 2 (two) times daily as needed  (shortness of breath).), Disp: 1 each, Rfl: 3   cetirizine (ZYRTEC) 10 MG tablet, Take 10 mg by mouth daily as needed for allergies., Disp: , Rfl:    Continuous Glucose Sensor (FREESTYLE LIBRE 3 SENSOR) MISC, Place 1 sensor on the skin every 14 days. Use to check glucose continuously; DX:E11.65, Disp: 6 each, Rfl: PRN   cyclobenzaprine (FLEXERIL) 10 MG tablet, Take 1 tablet 3 times a day by oral route as needed., Disp: , Rfl:    dexlansoprazole (DEXILANT) 60 MG capsule, Take 1 capsule (60 mg total) by mouth daily. Please keep your July appointment  for any further refills., Disp: 90 capsule, Rfl: 0   diclofenac Sodium (VOLTAREN) 1 % GEL, APPLY 2 GRAMS TO AFFECTED AREA 4 TIMES A DAY, Disp: 400 g, Rfl: 0   dicyclomine (BENTYL) 20 MG tablet, TAKE 1 TABLET (20 MG TOTAL) BY MOUTH EVERY 6 (SIX) HOURS. (Patient taking differently: Take 20 mg by mouth every 6 (six) hours as needed for spasms (abdominal cramps).), Disp: 360 tablet, Rfl: 0   Docusate Sodium (DSS) 100 MG CAPS, Take 1 capsule by mouth 2 (two) times daily., Disp: , Rfl:    FLUoxetine (PROZAC) 20 MG capsule, Take 20 mg by mouth daily., Disp: , Rfl:    gabapentin (NEURONTIN) 300 MG capsule, TAKE 1 CAPSULE BY MOUTH THREE TIMES A DAY, Disp: 270 capsule, Rfl: 3   hydrOXYzine (ATARAX) 10 MG tablet, Take 10 mg by mouth 3 (three) times daily as needed., Disp: , Rfl:    insulin degludec (TRESIBA FLEXTOUCH) 200 UNIT/ML FlexTouch Pen, Inject 50-60 Units into the skin at bedtime. Prn as directed by PCP, Disp: 18 mL, Rfl: 0   Insulin Pen Needle (B-D UF III MINI PEN NEEDLES) 31G X 5 MM MISC, use as directed once daily Dx E11.8, Disp: 100 each, Rfl: 4   levothyroxine (SYNTHROID) 125 MCG tablet, Take 1 tablet (125 mcg total) by mouth daily., Disp: 90 tablet, Rfl: 3   linaclotide (LINZESS) 72 MCG capsule, Take 1 capsule (72 mcg total) by mouth daily before breakfast., Disp: 30 capsule, Rfl: 3   medroxyPROGESTERone (DEPO-PROVERA) 150 MG/ML injection, INJECT 1 ML  (150 MG TOTAL) INTO THE MUSCLE EVERY 3 (THREE) MONTHS, Disp: 1 mL, Rfl: 2   Melatonin 2.5 MG CHEW, Chew by mouth., Disp: , Rfl:    Mepolizumab (NUCALA) 100 MG/ML SOAJ, Inject 1 mL (100 mg total) into the skin every 28 (twenty-eight) days., Disp: 3 mL, Rfl: 1   montelukast (SINGULAIR) 10 MG tablet, TAKE 1 TABLET BY MOUTH EVERYDAY AT BEDTIME, Disp: 90 tablet, Rfl: 1  nystatin cream (MYCOSTATIN), Apply 1 Application topically 2 (two) times daily. x2 weeks, Disp: 60 g, Rfl: 0   pioglitazone (ACTOS) 45 MG tablet, Take 1 tablet (45 mg total) by mouth daily., Disp: 90 tablet, Rfl: 3   rosuvastatin (CRESTOR) 40 MG tablet, Take 1 tablet (40 mg total) by mouth at bedtime., Disp: 90 tablet, Rfl: 3   tirzepatide (MOUNJARO) 12.5 MG/0.5ML Pen, Inject 12.5 mg into the skin once a week., Disp: 6 mL, Rfl: 3   tirzepatide (MOUNJARO) 15 MG/0.5ML Pen, Inject 15 mg into the skin once weekly, Disp: 6 mL, Rfl: 3   SUMAtriptan Succinate 4 MG/0.5ML SOCT, Inject 4 mg into the skin once for 1 dose., Disp: 9 mL, Rfl: 0 Social History   Socioeconomic History   Marital status: Divorced    Spouse name: Not on file   Number of children: Not on file   Years of education: Not on file   Highest education level: Some college, no degree  Occupational History   Not on file  Tobacco Use   Smoking status: Former    Packs/day: 1.00    Years: 5.00    Additional pack years: 0.00    Total pack years: 5.00    Types: Cigarettes    Quit date: 04/04/1993    Years since quitting: 29.6   Smokeless tobacco: Never  Vaping Use   Vaping Use: Every day   Substances: CBD  Substance and Sexual Activity   Alcohol use: No   Drug use: Yes    Types: Marijuana   Sexual activity: Yes    Birth control/protection: I.U.D.  Other Topics Concern   Not on file  Social History Narrative   Not on file   Social Determinants of Health   Financial Resource Strain: High Risk (11/02/2022)   Overall Financial Resource Strain (CARDIA)     Difficulty of Paying Living Expenses: Very hard  Food Insecurity: No Food Insecurity (11/02/2022)   Hunger Vital Sign    Worried About Running Out of Food in the Last Year: Never true    Ran Out of Food in the Last Year: Never true  Transportation Needs: No Transportation Needs (11/02/2022)   PRAPARE - Administrator, Civil Service (Medical): No    Lack of Transportation (Non-Medical): No  Physical Activity: Unknown (11/02/2022)   Exercise Vital Sign    Days of Exercise per Week: 0 days    Minutes of Exercise per Session: Not on file  Stress: Stress Concern Present (11/02/2022)   [employer] of Occupational Health - Occupational Stress Questionnaire    Feeling of Stress : Rather much  Social Connections: Socially Isolated (11/02/2022)   Social Connection and Isolation Panel [NHANES]    Frequency of Communication with Friends and Family: More than three times a week    Frequency of Social Gatherings with Friends and Family: Never    Attends Religious Services: Never    Database administrator or Organizations: No    Attends Engineer, structural: Not on file    Marital Status: Divorced  Catering manager Violence: Not on file   Family History  Problem Relation Age of Onset   Mental illness Other    Diabetes Other    Healthy Mother    Cancer Father    Non-Hodgkin's lymphoma Father    Breast cancer Paternal Grandmother    Lung cancer Paternal Grandmother    Esophageal cancer Other    Colon cancer Neg Hx    Stomach  cancer Neg Hx    Pancreatic cancer Neg Hx    Colon polyps Neg Hx     Objective: Office vital signs reviewed. BP 118/74   Pulse 85   Ht 5\' 7"  (1.702 m)   Wt 273 lb (123.8 kg)   SpO2 96%   BMI 42.76 kg/m   Physical Examination:  General: Awake, alert, , No acute distress HEENT: Normal, sclera white, MMM Neuro: No focal neurologic deficits.  Alert and oriented x 3 Psych: mood stable, speech normal, good eye contact    11/10/2022    9:52  AM 11/02/2022    3:24 PM 09/26/2021   12:13 PM  Depression screen PHQ 2/9  Decreased Interest 1 3 1   Down, Depressed, Hopeless 1 3 1   PHQ - 2 Score 2 6 2   Altered sleeping 1 3 0  Tired, decreased energy 2 3 2   Change in appetite 0 0 0  Feeling bad or failure about yourself  0 2 0  Trouble concentrating 3 3 2   Moving slowly or fidgety/restless 3 3 0  Suicidal thoughts 0 3   PHQ-9 Score 11 23 6   Difficult doing work/chores Not difficult at all  Somewhat difficult      11/10/2022    9:53 AM 11/02/2022    3:24 PM 09/26/2021   12:13 PM 09/08/2021    9:07 AM  GAD 7 : Generalized Anxiety Score  Nervous, Anxious, on Edge 3 3 2 2   Control/stop worrying 0 3 0 0  Worry too much - different things 0 3 0 0  Trouble relaxing 0 3 2 2   Restless 2 2 2  0  Easily annoyed or irritable 1 3 2 2   Afraid - awful might happen 0 2 0 0  Total GAD 7 Score 6 19 8 6   Anxiety Difficulty Somewhat difficult Extremely difficult Somewhat difficult Somewhat difficult    Assessment/ Plan: 52 y.o. female   Attention deficit disorder (ADD) without hyperactivity - Plan: ToxASSURE Select 13 (MW), Urine, amphetamine-dextroamphetamine (ADDERALL XR) 20 MG 24 hr capsule  Controlled substance agreement signed - Plan: ToxASSURE Select 13 (MW), Urine  Migraine without aura and without status migrainosus, not intractable - Plan: Rimegepant Sulfate (NURTEC) 75 MG TBDP  Under the care of psychiatry, who confirmed ADHD diagnosis.  We discussed that illicit substances are absolutely not acceptable if we are going to continue this medication going forward.  We completed a controlled substance contract and urine drug screen today.  Anticipate it will be positive for THC.  We will plan for a random drug screen at some point in the future as well.  She understands no illicit substances from this point forward otherwise this will violate controlled substance contract.  Adderall has been ordered I had extended release 20 mg and we will  titrate up in 1 month if needed.  We discussed potential side effects of this type of medication and reasons for discontinuation.  She will monitor blood pressures closely.  Continue psychiatric interventions as directed.  National narcotic database reviewed and there were no red flags  For migraine headaches have given her samples of Nurtec.  We discussed how to utilize this medication.  If needing frequently, need to consider prophylactic  Orders Placed This Encounter  Procedures   ToxASSURE Select 13 (MW), Urine    Current Outpatient Medications:    Accu-Chek FastClix Lancets MISC, Test blood sugars daily Dx E11.9, Disp: 102 each, Rfl: 3   ACCU-CHEK GUIDE test strip, TEST BLOOD SUGARS DAILY  DX E11.9, Disp: 100 strip, Rfl: 3   albuterol (PROVENTIL) (2.5 MG/3ML) 0.083% nebulizer solution, Take 3 mLs (2.5 mg total) by nebulization every 6 (six) hours as needed for wheezing or shortness of breath. Fill upon patient request, Disp: 75 mL, Rfl: 12   albuterol (VENTOLIN HFA) 108 (90 Base) MCG/ACT inhaler, Inhale 2 puffs into the lungs every 6 (six) hours as needed for wheezing or shortness of breath. Fill upon patient request, Disp: 8 g, Rfl: 1   azelastine (ASTELIN) 0.1 % nasal spray, Place 1 spray into both nostrils 2 (two) times daily., Disp: 30 mL, Rfl: 12   Blood Glucose Monitoring Suppl (ACCU-CHEK GUIDE ME) w/Device KIT, Use to check BS BID and as needed. DX: E11.9, Disp: 1 kit, Rfl: 0   budesonide-formoterol (SYMBICORT) 160-4.5 MCG/ACT inhaler, Inhale 2 puffs into the lungs 2 (two) times daily. (Patient taking differently: Inhale 2 puffs into the lungs 2 (two) times daily as needed (shortness of breath).), Disp: 1 each, Rfl: 3   cetirizine (ZYRTEC) 10 MG tablet, Take 10 mg by mouth daily as needed for allergies., Disp: , Rfl:    Continuous Glucose Sensor (FREESTYLE LIBRE 3 SENSOR) MISC, Place 1 sensor on the skin every 14 days. Use to check glucose continuously; DX:E11.65, Disp: 6 each,  Rfl: PRN   cyclobenzaprine (FLEXERIL) 10 MG tablet, Take 1 tablet 3 times a day by oral route as needed., Disp: , Rfl:    dexlansoprazole (DEXILANT) 60 MG capsule, Take 1 capsule (60 mg total) by mouth daily. Please keep your July appointment  for any further refills., Disp: 90 capsule, Rfl: 0   diclofenac Sodium (VOLTAREN) 1 % GEL, APPLY 2 GRAMS TO AFFECTED AREA 4 TIMES A DAY, Disp: 400 g, Rfl: 0   dicyclomine (BENTYL) 20 MG tablet, TAKE 1 TABLET (20 MG TOTAL) BY MOUTH EVERY 6 (SIX) HOURS. (Patient taking differently: Take 20 mg by mouth every 6 (six) hours as needed for spasms (abdominal cramps).), Disp: 360 tablet, Rfl: 0   Docusate Sodium (DSS) 100 MG CAPS, Take 1 capsule by mouth 2 (two) times daily., Disp: , Rfl:    gabapentin (NEURONTIN) 300 MG capsule, TAKE 1 CAPSULE BY MOUTH THREE TIMES A DAY, Disp: 270 capsule, Rfl: 3   hydrOXYzine (ATARAX) 10 MG tablet, Take 10 mg by mouth 3 (three) times daily as needed., Disp: , Rfl:    insulin degludec (TRESIBA FLEXTOUCH) 200 UNIT/ML FlexTouch Pen, Inject 50-60 Units into the skin at bedtime. Prn as directed by PCP, Disp: 18 mL, Rfl: 0   Insulin Pen Needle (B-D UF III MINI PEN NEEDLES) 31G X 5 MM MISC, use as directed once daily Dx E11.8, Disp: 100 each, Rfl: 4   levothyroxine (SYNTHROID) 125 MCG tablet, Take 1 tablet (125 mcg total) by mouth daily., Disp: 90 tablet, Rfl: 3   linaclotide (LINZESS) 72 MCG capsule, Take 1 capsule (72 mcg total) by mouth daily before breakfast., Disp: 30 capsule, Rfl: 3   medroxyPROGESTERone (DEPO-PROVERA) 150 MG/ML injection, INJECT 1 ML (150 MG TOTAL) INTO THE MUSCLE EVERY 3 (THREE) MONTHS, Disp: 1 mL, Rfl: 2   Melatonin 2.5 MG CHEW, Chew by mouth., Disp: , Rfl:    Mepolizumab (NUCALA) 100 MG/ML SOAJ, Inject 1 mL (100 mg total) into the skin every 28 (twenty-eight) days., Disp: 3 mL, Rfl: 1   montelukast (SINGULAIR) 10 MG tablet, TAKE 1 TABLET BY MOUTH EVERYDAY AT BEDTIME, Disp: 90 tablet, Rfl: 1    nystatin cream (MYCOSTATIN), Apply 1 Application topically 2 (  two) times daily. x2 weeks, Disp: 60 g, Rfl: 0   pioglitazone (ACTOS) 45 MG tablet, Take 1 tablet (45 mg total) by mouth daily., Disp: 90 tablet, Rfl: 3   rosuvastatin (CRESTOR) 40 MG tablet, Take 1 tablet (40 mg total) by mouth at bedtime., Disp: 90 tablet, Rfl: 3   sertraline (ZOLOFT) 100 MG tablet, Take 200 mg by mouth daily., Disp: , Rfl:    SUMAtriptan Succinate 4 MG/0.5ML SOCT, Inject 4 mg into the skin once for 1 dose., Disp: 9 mL, Rfl: 0   tirzepatide (MOUNJARO) 12.5 MG/0.5ML Pen, Inject 12.5 mg into the skin once a week., Disp: 6 mL, Rfl: 3   tirzepatide (MOUNJARO) 15 MG/0.5ML Pen, Inject 15 mg into the skin once weekly, Disp: 6 mL, Rfl: 3   VALIUM 10 MG tablet, Take 10 mg by mouth every 8 (eight) hours as needed for anxiety. Rx by [employer], Disp: , Rfl:   No orders of the defined types were placed in this encounter.    Raliegh Ip, DO [facility] [phone]

## 2022-11-11 ENCOUNTER — Ambulatory Visit
Admission: RE | Admit: 2022-11-11 | Discharge: 2022-11-11 | Disposition: A | Discharge status: Home / Self Care | Payer: Medicaid Other | Source: Ambulatory Visit | Attending: Family Medicine | Admitting: Family Medicine

## 2022-11-11 DIAGNOSIS — Z1231 Encounter for screening mammogram for malignant neoplasm of breast: Secondary | ICD-10-CM

## 2022-11-13 ENCOUNTER — Other Ambulatory Visit: Payer: Self-pay

## 2022-11-13 ENCOUNTER — Other Ambulatory Visit (HOSPITAL_COMMUNITY): Payer: Self-pay

## 2022-11-13 ENCOUNTER — Telehealth (HOSPITAL_COMMUNITY): Payer: Self-pay | Admitting: Pharmacy Technician

## 2022-11-13 LAB — TOXASSURE SELECT 13 (MW), URINE

## 2022-11-13 NOTE — Telephone Encounter (Signed)
Pharmacy Patient Advocate Encounter  Received notification from [facility] that Prior Authorization for Nurtec 75MG  dispersible tablets has been APPROVED from 11/13/2022 to 11/13/2023.Marland Kitchen  PA #/Case ID/Reference #: [id number]  Key: Felix Pacini

## 2022-11-17 ENCOUNTER — Encounter: Payer: Self-pay | Admitting: Gastroenterology

## 2022-11-17 ENCOUNTER — Ambulatory Visit: Payer: Medicaid Other | Admitting: Gastroenterology

## 2022-11-17 ENCOUNTER — Telehealth: Payer: Self-pay

## 2022-11-17 VITALS — BP 124/70 | HR 69 | Ht 67.5 in | Wt 272.0 lb

## 2022-11-17 DIAGNOSIS — R1310 Dysphagia, unspecified: Secondary | ICD-10-CM | POA: Diagnosis not present

## 2022-11-17 DIAGNOSIS — K224 Dyskinesia of esophagus: Secondary | ICD-10-CM

## 2022-11-17 DIAGNOSIS — K5900 Constipation, unspecified: Secondary | ICD-10-CM

## 2022-11-17 DIAGNOSIS — K219 Gastro-esophageal reflux disease without esophagitis: Secondary | ICD-10-CM | POA: Diagnosis not present

## 2022-11-17 MED ORDER — DEXLANSOPRAZOLE 60 MG PO CPDR: 60.0000 mg | DELAYED_RELEASE_CAPSULE | Freq: Every day | ORAL | 1 refills | Status: DC

## 2022-11-17 NOTE — Patient Instructions (Signed)
You have been scheduled for a Barium Esophogram at [facility] on Monday, 11-23-22 at 10:30 am. Please arrive 30 minutes prior to your appointment for registration. Make certain not to have anything to eat or drink 3 hours prior to your test. If you need to reschedule for any reason, please contact radiology at [phone] to do so. __________________________________________________________________ A barium swallow is an examination that concentrates on views of the esophagus. This tends to be a double contrast exam (barium and two liquids which, when combined, create a gas to distend the wall of the oesophagus) or single contrast (non-ionic iodine based). The study is usually tailored to your symptoms so a good history is essential. Attention is paid during the study to the form, structure and configuration of the esophagus, looking for functional disorders (such as aspiration, dysphagia, achalasia, motility and reflux) EXAMINATION You may be asked to change into a gown, depending on the type of swallow being performed. A radiologist and radiographer will perform the procedure. The radiologist will advise you of the type of contrast selected for your procedure and direct you during the exam. You will be asked to stand, sit or lie in several different positions and to hold a small amount of fluid in your mouth before being asked to swallow while the imaging is performed .In some instances you may be asked to swallow barium coated marshmallows to assess the motility of a solid food bolus. The exam can be recorded as a digital or video fluoroscopy procedure. POST PROCEDURE It will take 1-2 days for the barium to pass through your system. To facilitate this, it is important, unless otherwise directed, to increase your fluids for the next 24-48hrs and to resume your normal diet.  This test typically takes about 30 minutes to  perform. __________________________________________________________________________________  We are referring you to [facility].  They will contact you directly to schedule an appointment.  It may take a week or more before you hear from them.  Please feel free to contact us if you have not heard from them within 2 weeks and we will follow up on the referral.    We have sent the following medications to your pharmacy for you to pick up at your convenience: Dexilant  Continue bowel regime.  Continue Linzess as needed.  Thank you for entrusting me with your care and for choosing [facility], Dr. Ileene Patrick

## 2022-11-17 NOTE — Telephone Encounter (Signed)
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 Fax: [phone], PH: [phone]

## 2022-11-17 NOTE — Progress Notes (Signed)
HPI :  52 y/o female here for a follow up visit for dysphagia, GERD, constipation.   Recall she has had longstanding dysphagia for years. She had an EGD and 2008 with Dr. Arlyce Dice with empiric Botox injection at the LES which did provide some benefit but she had never had any manometry.  I performed an EGD with empiric dilation back in September 2021, she did not have any benefit with the dilation at that time.  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  I repeated an endoscopy for her in September 2022 which showed no concerning findings endoscopically, small hiatal hernia, Botox injected just proximal to the Z-line.  She states she had a good relief of her symptoms for some time and had absolutely no dysphagia at all following the Botox.  She was very happy with her response.  She had maintained Dexilant and that worked really well for her reflux symptoms.  She states over time her dysphagia has slowly come back over the past few months.  She has intermittent dysphagia to both solids and liquids, feels it in her chest.  She can have dysphagia just water at times.  She feels that most times when she eats to some extent, she thinks again getting more frequent over time.  She inquires about another EGD with Botox or long-term management..  Recall she was previously on narcotics in the past but not on them anymore, she was not on them for her manometry.  Recall she also has a history of gastroparesis.  Was on Reglan previously for this but stopped it as she did not think she needed it anymore.  She thought Trulicity caused her symptoms to be much worse previously and she stopped it.  She is actually on Mounjaro right now and tolerating it quite well.  No early satiety.  No nausea or vomiting.  She states the Dexilant works really well to control her reflux and she is very happy with how that is working for her.  She otherwise takes some stool softeners and Linzess  as needed for constipation which is longstanding.  She states this were working pretty well for her and she is pretty happy.  The main issue that is bothering her today is the dysphagia    Most recent workup: EGD 02/07/2020 - - A 1 cm hiatal hernia was present. - The exam of the esophagus was otherwise normal. No obvious stenosis / stricture or inflammatory changes noted. - A guidewire was placed and the scope was withdrawn. Empiric dilation was performed in the entire esophagus with a Savary dilator with mild resistance at 17 mm and 18 mm. Relook endoscopy showed no mucosal wrents. Biopsies were taken with a cold forceps in the upper third of the esophagus, in the middle third of the esophagus and in the lower third of the esophagus for histology. - The entire examined stomach was normal. Biopsies were taken with a cold forceps for Helicobacter pylori testing. - The duodenal bulb and second portion of the duodenum were normal.   Colonoscopy 02/07/20 - The perianal and digital rectal examinations were normal. - The terminal ileum appeared normal. - Two sessile polyps were found in the cecum. The polyps were 3 mm in size. These polyps were removed with a cold snare. Resection and retrieval were complete. - A 3 mm polyp was found in the ascending colon. The polyp was sessile. The polyp was removed with a cold snare. Resection and retrieval were complete. -  A 3 mm polyp was found in the transverse colon. The polyp was sessile. The polyp was removed with a cold snare. Resection and retrieval were complete. - Internal hemorrhoids were found during retroflexion. - The exam was otherwise without abnormality. - Biopsies for histology were taken with a cold forceps from the right colon and left colon for evaluation of microscopic colitis.   1. Surgical [P], gastric antrum and gastric body - GASTRIC ANTRAL AND OXYNTIC MUCOSA WITH NONSPECIFIC REACTIVE GASTROPATHY - WARTHIN STARRY STAIN IS NEGATIVE  FOR HELICOBACTER PYLORI 2. Surgical [P], esophageal - ESOPHAGEAL SQUAMOUS MUCOSA WITH VASCULAR CONGESTION, VASCULAR LAKES, AND SQUAMOUS BALLOONING, CONSISTENT WITH REFLUX ESOPHAGITIS - NEGATIVE FOR INCREASED INTRAEPITHELIAL EOSINOPHILS 3. 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 Surgical [P], random colon - COLONIC MUCOSA WITH NO SPECIFIC HISTOPATHOLOGIC CHANGES - NEGATIVE FOR ACUTE INFLAMMATION, INCREASED INTRAEPITHELIAL LYMPHOCYTES OR THICKENED SUBEPITHELIAL COLLAGEN TABLE     GES 04/01/20 - consistent with gastroparesis   CT scan abdomen pelvis with contrast - [facility] 10/31/19 - FINDINGS:   The visualized portions of the lower chest are unremarkable.   The gallbladder surgically absent. The liver, spleen, pancreas, and adrenal glands are unremarkable.   The kidneys enhance symmetrically without hydronephrosis. The bladder is decompressed. The uterus is present. IUD is in place.   The small bowel is nondilated. The colon is unremarkable. The appendix is not definitively seen and may be absent. No free air or free fluid. No bulky adenopathy. Mild aortoiliac atherosclerotic disease.   No acute osseous abnormality is seen. There is degenerative change in the spine.    IMPRESSION:  1.  No acute intra-abdominal abnormality.      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 - 1 cm hiatal hernia. - Successful Botox injection 1cm above the Z-line (total 100 units delivered) - A single benign appearing gastric polyp. Biopsied. - Normal stomach otherwise - Normal duodenal bulb and second portion of the duodenum  FINAL MICROSCOPIC DIAGNOSIS:   A. STOMACH, ANTRUM, POLYPECTOMY:  - Hyperplastic  gastric polyp with focal erosion and mild inflammation.  - Warthin-Starry negative for Helicobacter pylori.  - No intestinal metaplasia, adenomatous change or carcinoma.        Past Medical History:  Diagnosis Date   ADD (attention deficit disorder) without hyperactivity    Allergy    Anxiety    Arthritis    Asthma    Bipolar 1 disorder (HCC)    Blood transfusion without reported diagnosis    with child birth 16 yrs ago    Bronchitis, chronic (HCC)    Constipation    COVID-19 virus infection 06/21/2020   DDD (degenerative disc disease), cervical    Depression    Diabetes mellitus without complication (HCC)    Dysphagia    Family history of adverse reaction to anesthesia    mother had a hard time waking up after knee surgery in 2021   Gastroparesis    GERD (gastroesophageal reflux disease)    History of kidney stones    History of MRSA infection    Hyperlipemia    Hypothyroid    Migraines    Pneumonia    Sleep apnea    no cpap      Past Surgical History:  Procedure Laterality Date  ABDOMINAL SURGERY     ANTERIOR CERVICAL DECOMP/DISCECTOMY FUSION N/A 10/09/2020   Procedure: Anterior Cervical Discectomy Fusion Cervical five-six;  Surgeon: Bedelia Person, MD;  Location: [facility] OR;  Service: Neurosurgery;  Laterality: N/A;   BIOPSY  02/04/2021   Procedure: BIOPSY;  Surgeon: Benancio Deeds, MD;  Location: [facility];  Service: Gastroenterology;;   BOTOX INJECTION N/A 02/04/2021   Procedure: BOTOX INJECTION;  Surgeon: Benancio Deeds, MD;  Location: [facility];  Service: Gastroenterology;  Laterality: N/A;   CARPAL TUNNEL RELEASE Bilateral 1999   CARPOMETACARPEL SUSPENSION PLASTY Left 08/05/2022   Procedure: Left thumb trapeziectomy and suspensionplasty;  Surgeon: Marlyne Beards, MD;  Location: [facility];  Service: Orthopedics;  Laterality: Left;  with regional block  120   CESAREAN SECTION     CHOLECYSTECTOMY     COLONOSCOPY      ESOPHAGEAL MANOMETRY N/A 07/08/2020   Procedure: ESOPHAGEAL MANOMETRY (EM);  Surgeon: Benancio Deeds, MD;  Location: [facility];  Service: Gastroenterology;  Laterality: N/A;   ESOPHAGOGASTRODUODENOSCOPY (EGD) WITH PROPOFOL N/A 02/04/2021   Procedure: ESOPHAGOGASTRODUODENOSCOPY (EGD) WITH PROPOFOL;  Surgeon: Benancio Deeds, MD;  Location: [facility];  Service: Gastroenterology;  Laterality: N/A;   REPLACEMENT TOTAL KNEE Bilateral    SCLEROTHERAPY  02/04/2021   Procedure: SCLEROTHERAPY;  Surgeon: Benancio Deeds, MD;  Location: [facility];  Service: Gastroenterology;;   THYROIDECTOMY     TONSILLECTOMY     TONSILLECTOMY AND ADENOIDECTOMY     TRIGGER FINGER RELEASE Left 06/16/2021   Procedure: LEFT THUMB TRIGGER FINGER RELEASE;  Surgeon: Oliver Barre, MD;  Location: [facility];  Service: Orthopedics;  Laterality: Left;   TRIGGER FINGER RELEASE Right 09/29/2021   Procedure: RELEASE TRIGGER FINGER/A-1 PULLEY;  Surgeon: Oliver Barre, MD;  Location: [facility];  Service: Orthopedics;  Laterality: Right;  Right ring, index and thumb trigger finger   UPPER GASTROINTESTINAL ENDOSCOPY     Family History  Problem Relation Age of Onset   Asthma Mother    Bronchiolitis Mother    Atrial fibrillation Mother    GER disease Mother    Osteoporosis Mother    Osteoarthritis Mother    Dementia Mother        mild , was medically induced   Transient ischemic attack Mother        x 2   Aneurysm Mother        thoriatic   Bipolar disorder Mother    Depression Mother    Hyperlipidemia Mother    Non-Hodgkin's lymphoma Father    Hyperlipidemia Father    Breast cancer Paternal Grandmother    Lung cancer Paternal Grandmother    Mental illness Other    Diabetes Other    Esophageal cancer Other    Colon cancer Neg Hx    Stomach cancer Neg Hx    Pancreatic cancer Neg Hx    Colon polyps Neg Hx    Social History   Tobacco Use   Smoking status: Former    Packs/day: 1.00    Years: 5.00     Additional pack years: 0.00    Total pack years: 5.00    Types: Cigarettes    Quit date: 04/04/1993    Years since quitting: 29.6   Smokeless tobacco: Never  Vaping Use   Vaping Use: Every day   Substances: CBD  Substance Use Topics   Alcohol use: No   Drug use: Yes    Types: Marijuana   Current Outpatient Medications  Medication  Sig Dispense Refill   Accu-Chek FastClix Lancets MISC Test blood sugars daily Dx E11.9 102 each 3   ACCU-CHEK GUIDE test strip TEST BLOOD SUGARS DAILY DX E11.9 100 strip 3   albuterol (PROVENTIL) (2.5 MG/3ML) 0.083% nebulizer solution Take 3 mLs (2.5 mg total) by nebulization every 6 (six) hours as needed for wheezing or shortness of breath. Fill upon patient request 75 mL 12   albuterol (VENTOLIN HFA) 108 (90 Base) MCG/ACT inhaler Inhale 2 puffs into the lungs every 6 (six) hours as needed for wheezing or shortness of breath. Fill upon patient request 8 g 1   amphetamine-dextroamphetamine (ADDERALL XR) 20 MG 24 hr capsule Take 1 capsule (20 mg total) by mouth in the morning. 30 capsule 0   azelastine (ASTELIN) 0.1 % nasal spray Place 1 spray into both nostrils 2 (two) times daily. 30 mL 12   Blood Glucose Monitoring Suppl (ACCU-CHEK GUIDE ME) w/Device KIT Use to check BS BID and as needed. DX: E11.9 1 kit 0   budesonide-formoterol (SYMBICORT) 160-4.5 MCG/ACT inhaler Inhale 2 puffs into the lungs 2 (two) times daily. (Patient taking differently: Inhale 2 puffs into the lungs 2 (two) times daily as needed (shortness of breath).) 1 each 3   cetirizine (ZYRTEC) 10 MG tablet Take 10 mg by mouth daily as needed for allergies.     Continuous Glucose Sensor (FREESTYLE LIBRE 3 SENSOR) MISC Place 1 sensor on the skin every 14 days. Use to check glucose continuously; DX:E11.65 6 each PRN   cyclobenzaprine (FLEXERIL) 10 MG tablet Take 1 tablet 3 times a day by oral route as needed.     dexlansoprazole (DEXILANT) 60 MG capsule Take 1 capsule (60 mg total) by mouth daily.  Please keep your July appointment  for any further refills. 90 capsule 0   diclofenac Sodium (VOLTAREN) 1 % GEL APPLY 2 GRAMS TO AFFECTED AREA 4 TIMES A DAY 400 g 0   dicyclomine (BENTYL) 20 MG tablet TAKE 1 TABLET (20 MG TOTAL) BY MOUTH EVERY 6 (SIX) HOURS. (Patient taking differently: Take 20 mg by mouth every 6 (six) hours as needed for spasms (abdominal cramps).) 360 tablet 0   Docusate Sodium (DSS) 100 MG CAPS Take 1 capsule by mouth 2 (two) times daily.     FLUoxetine (PROZAC) 20 MG capsule Take 20 mg by mouth daily.     gabapentin (NEURONTIN) 300 MG capsule TAKE 1 CAPSULE BY MOUTH THREE TIMES A DAY 270 capsule 3   hydrOXYzine (ATARAX) 10 MG tablet Take 10 mg by mouth 3 (three) times daily as needed.     insulin degludec (TRESIBA FLEXTOUCH) 200 UNIT/ML FlexTouch Pen Inject 50-60 Units into the skin at bedtime. Prn as directed by PCP 18 mL 0   Insulin Pen Needle (B-D UF III MINI PEN NEEDLES) 31G X 5 MM MISC use as directed once daily Dx E11.8 100 each 4   levothyroxine (SYNTHROID) 125 MCG tablet Take 1 tablet (125 mcg total) by mouth daily. 90 tablet 3   linaclotide (LINZESS) 72 MCG capsule Take 1 capsule (72 mcg total) by mouth daily before breakfast. 30 capsule 3   medroxyPROGESTERone (DEPO-PROVERA) 150 MG/ML injection INJECT 1 ML (150 MG TOTAL) INTO THE MUSCLE EVERY 3 (THREE) MONTHS 1 mL 2   Melatonin 2.5 MG CHEW Chew by mouth.     Mepolizumab (NUCALA) 100 MG/ML SOAJ Inject 1 mL (100 mg total) into the skin every 28 (twenty-eight) days. 3 mL 1   montelukast (SINGULAIR) 10 MG tablet TAKE  1 TABLET BY MOUTH EVERYDAY AT BEDTIME 90 tablet 1   nystatin cream (MYCOSTATIN) Apply 1 Application topically 2 (two) times daily. x2 weeks 60 g 0   pioglitazone (ACTOS) 45 MG tablet Take 1 tablet (45 mg total) by mouth daily. 90 tablet 3   Rimegepant Sulfate (NURTEC) 75 MG TBDP Dissolve 1 tablet in mouth at onset of migraine headache. MAX 1 tablet per 24 hours. 10 tablet PRN   rosuvastatin (CRESTOR) 40 MG  tablet Take 1 tablet (40 mg total) by mouth at bedtime. 90 tablet 3   tirzepatide (MOUNJARO) 12.5 MG/0.5ML Pen Inject 12.5 mg into the skin once a week. 6 mL 3   tirzepatide (MOUNJARO) 15 MG/0.5ML Pen Inject 15 mg into the skin once weekly (Patient not taking: Reported on 11/17/2022) 6 mL 3   No current facility-administered medications for this visit.   Allergies  Allergen Reactions   Diphenhydramine Other (See Comments)    Hair feels like it is crawling   Morphine Nausea And Vomiting    PCA PUMP/ DRIP -- N/V IV PUSH IN ER NO PROBLEM PER PT.     Prednisone Nausea And Vomiting   Adhesive [Tape]     Paper tape causes irritation    Erythromycin Hives   Latex    Metformin And Related     Diarrhea with IR and XR products   Sulfonamide Derivatives Other (See Comments)    Does not take due to family history     Review of Systems: All systems reviewed and negative except where noted in HPI.    Lab Results  Component Value Date   WBC 8.5 05/08/2022   HGB 13.7 05/08/2022   HCT 43.3 05/08/2022   MCV 85 05/08/2022   PLT 385 05/08/2022    Lab Results  Component Value Date   CREATININE 0.88 11/02/2022   BUN 14 11/02/2022   NA 139 11/02/2022   K 4.2 11/02/2022   CL 105 11/02/2022   CO2 20 11/02/2022    Lab Results  Component Value Date   ALT 10 11/02/2022   AST 12 11/02/2022   ALKPHOS 97 11/02/2022   BILITOT 0.2 11/02/2022     Physical Exam: BP 124/70   Pulse 69   Ht 5' 7.5" (1.715 m)   Wt 272 lb (123.4 kg)   BMI 41.97 kg/m  Constitutional: Pleasant,well-developed, female in no acute distress. Neurological: Alert and oriented to person place and time. Psychiatric: Normal mood and affect. Behavior is normal.   ASSESSMENT: 52 y.o. female here for assessment of the following  1. Dysphagia, unspecified type   2. Esophageal motility disorder   3. Gastroesophageal reflux disease, unspecified whether esophagitis present   4. Constipation, unspecified constipation  type    Status post EGD with GEJ dilation which provided no benefit to her dysphagia.  Manometry showed EGJ outflow obstruction which is the likely cause of her symptoms.  She had a EGD with Botox injection in 2022 after she declined referral to tertiary care center for further evaluation at the time.  She did well with Botox and this resolved her symptoms for several months.  Her symptoms have started to recur over time.  Discussed the situation with her, she is currently not taking any narcotics which could be contributing to this.  I would like to have her referred to [facility] or other tertiary care center evaluation for EGJ outflow obstruction to consider POEM, as given her history of recurrent symptoms I think she should have an evaluation  for a better long term solution than continued Botox injections.  She is agreeable to this.  We also discussed that EGJ outflow obstruction can develop into achalasia.  I will order a barium swallow to ensure no overt changes concerning for this and get a general sense of motility.  She may need a repeat manometry prior to any invasive intervention, but will defer that to [facility], we are currently booking out several months for that at our facility.  She will keep me posted when she hears back from them, until then stay on soft/liquid diet.  She is very happy with how Dexilant is working to control her reflux.  She does not want to stop it.  I discussed risk benefits of chronic PPI use and she understands.  Fortunately her gastroparesis symptoms have resolved and she is tolerating Mounjaro with good weight loss so far.  Otherwise continue bowel regimen for her constipation, colonoscopy up-to-date.  PLAN: - order barium swallow with tablet - get sense of motility and assess for any obvious changes concerning for achalasia.  - refer to [facility] for GEJ outflow obstruction, consider POEM. May need repeat manometry, defer to them given we are booking out several months for  this - refill Dexilant - discussed risks/ benefits she wishes to continue it - continue bowel regimen, with Linzess PRN  Harlin Rain, MD [facility]

## 2022-11-23 ENCOUNTER — Other Ambulatory Visit (HOSPITAL_COMMUNITY): Payer: Medicaid Other

## 2022-11-23 ENCOUNTER — Other Ambulatory Visit (HOSPITAL_COMMUNITY): Payer: Self-pay

## 2022-11-23 NOTE — Telephone Encounter (Signed)
Called to check on status of referral. They confirmed they rec'd the referral but they have many ahead of her.  Hopefully they will get to it this week and will reach out to the patient to get her scheduled

## 2022-11-24 ENCOUNTER — Encounter: Payer: Self-pay | Admitting: Pharmacist

## 2022-11-24 ENCOUNTER — Other Ambulatory Visit: Payer: Self-pay

## 2022-11-24 ENCOUNTER — Telehealth: Payer: Self-pay | Admitting: Family Medicine

## 2022-11-24 ENCOUNTER — Telehealth: Payer: Self-pay | Admitting: Pulmonary Disease

## 2022-11-24 NOTE — Telephone Encounter (Signed)
Pt called in to get a Epi-Pen prescription sent in, Pt doesn't need a epi-Pen at the moment. Her Prescription expired  [id number] - [place], [place] - [address]

## 2022-11-25 MED ORDER — EPINEPHRINE 0.3 MG/0.3ML IJ SOAJ: 0.3000 mg | INTRAMUSCULAR | 2 refills | Status: AC | PRN

## 2022-11-25 NOTE — Telephone Encounter (Signed)
Refill for Epipen sent to pharmacy. Patient advised that Epipen is not required for Nucala if no issues. She confirms no issues but would like to have on hand.  Chesley Mires, PharmD, MPH, BCPS, CPP Clinical Pharmacist (Rheumatology and Pulmonology)

## 2022-11-25 NOTE — Telephone Encounter (Signed)
Lmtcb.

## 2022-11-25 NOTE — Telephone Encounter (Signed)
I spoke to pt and she stated that. When she was prescribed Mepolizumab (NUCALA) 100 MG/ML SOAJ she was also prescribed an Epipen which has expired. I did not see it on pt meds list to refill it. So, I let pt know I will send a message to Dr Judeth Horn and our pharmacy team to refill the Epipen. Pt verbalized understanding. Will leave encounter open.

## 2022-11-26 ENCOUNTER — Other Ambulatory Visit: Payer: Self-pay

## 2022-11-27 ENCOUNTER — Other Ambulatory Visit (HOSPITAL_COMMUNITY): Payer: Self-pay

## 2022-12-01 ENCOUNTER — Other Ambulatory Visit (HOSPITAL_COMMUNITY): Payer: Self-pay

## 2022-12-01 ENCOUNTER — Ambulatory Visit: Payer: Medicaid Other

## 2022-12-07 ENCOUNTER — Encounter: Payer: Self-pay | Admitting: Family Medicine

## 2022-12-07 ENCOUNTER — Ambulatory Visit: Payer: Medicaid Other | Admitting: Family Medicine

## 2022-12-07 VITALS — BP 148/95 | HR 108 | Temp 97.9°F | Ht 67.5 in | Wt 267.8 lb

## 2022-12-07 DIAGNOSIS — I10 Essential (primary) hypertension: Secondary | ICD-10-CM

## 2022-12-07 DIAGNOSIS — F988 Other specified behavioral and emotional disorders with onset usually occurring in childhood and adolescence: Secondary | ICD-10-CM

## 2022-12-07 MED ORDER — AMPHETAMINE-DEXTROAMPHET ER 25 MG PO CP24
25.0000 mg | ORAL_CAPSULE | Freq: Every morning | ORAL | 0 refills | Status: DC
Start: 2022-12-09 — End: 2023-01-06

## 2022-12-07 NOTE — Progress Notes (Signed)
Subjective: CC: ADHD follow-up PCP: Raliegh Ip, DO HPI:Judith [name] is a 52 y.o. female presenting to clinic today for:  1.  ADHD follow-up Patient reports that the Adderall extended release 20 mg has really been helpful.  She notes improvement in concentration, focus and completion of tasks.  She denies any excessive stimulation for the medication including insomnia, increased anxiety, heart palpitations etc.   ROS: Per HPI  Allergies  Allergen Reactions   Diphenhydramine Other (See Comments)    Hair feels like it is crawling   Morphine Nausea And Vomiting    PCA PUMP/ DRIP -- N/V IV PUSH IN ER NO PROBLEM PER PT.     Prednisone Nausea And Vomiting   Adhesive [Tape]     Paper tape causes irritation    Erythromycin Hives   Latex    Metformin And Related     Diarrhea with IR and XR products   Sulfonamide Derivatives Other (See Comments)    Does not take due to family history   Past Medical History:  Diagnosis Date   ADD (attention deficit disorder) without hyperactivity    Allergy    Anxiety    Arthritis    Asthma    Bipolar 1 disorder (HCC)    Blood transfusion without reported diagnosis    with child birth 16 yrs ago    Bronchitis, chronic (HCC)    Constipation    COVID-19 virus infection 06/21/2020   DDD (degenerative disc disease), cervical    Depression    Diabetes mellitus without complication (HCC)    Dysphagia    Family history of adverse reaction to anesthesia    mother had a hard time waking up after knee surgery in 2021   Gastroparesis    GERD (gastroesophageal reflux disease)    History of kidney stones    History of MRSA infection    Hyperlipemia    Hypothyroid    Migraines    Pneumonia    Sleep apnea    no cpap     Current Outpatient Medications:    Accu-Chek FastClix Lancets MISC, Test blood sugars daily Dx E11.9, Disp: 102 each, Rfl: 3   ACCU-CHEK GUIDE test strip, TEST BLOOD SUGARS DAILY DX E11.9, Disp: 100 strip, Rfl: 3    albuterol (PROVENTIL) (2.5 MG/3ML) 0.083% nebulizer solution, Take 3 mLs (2.5 mg total) by nebulization every 6 (six) hours as needed for wheezing or shortness of breath. Fill upon patient request, Disp: 75 mL, Rfl: 12   albuterol (VENTOLIN HFA) 108 (90 Base) MCG/ACT inhaler, Inhale 2 puffs into the lungs every 6 (six) hours as needed for wheezing or shortness of breath. Fill upon patient request, Disp: 8 g, Rfl: 1   amphetamine-dextroamphetamine (ADDERALL XR) 20 MG 24 hr capsule, Take 1 capsule (20 mg total) by mouth in the morning., Disp: 30 capsule, Rfl: 0   azelastine (ASTELIN) 0.1 % nasal spray, Place 1 spray into both nostrils 2 (two) times daily., Disp: 30 mL, Rfl: 12   Blood Glucose Monitoring Suppl (ACCU-CHEK GUIDE ME) w/Device KIT, Use to check BS BID and as needed. DX: E11.9, Disp: 1 kit, Rfl: 0   budesonide-formoterol (SYMBICORT) 160-4.5 MCG/ACT inhaler, Inhale 2 puffs into the lungs 2 (two) times daily. (Patient taking differently: Inhale 2 puffs into the lungs 2 (two) times daily as needed (shortness of breath).), Disp: 1 each, Rfl: 3   cetirizine (ZYRTEC) 10 MG tablet, Take 10 mg by mouth daily as needed for allergies., Disp: , Rfl:  Continuous Glucose Sensor (FREESTYLE LIBRE 3 SENSOR) MISC, Place 1 sensor on the skin every 14 days. Use to check glucose continuously; DX:E11.65, Disp: 6 each, Rfl: PRN   cyclobenzaprine (FLEXERIL) 10 MG tablet, Take 1 tablet 3 times a day by oral route as needed., Disp: , Rfl:    dexlansoprazole (DEXILANT) 60 MG capsule, Take 1 capsule (60 mg total) by mouth daily., Disp: 90 capsule, Rfl: 1   diclofenac Sodium (VOLTAREN) 1 % GEL, APPLY 2 GRAMS TO AFFECTED AREA 4 TIMES A DAY, Disp: 400 g, Rfl: 0   dicyclomine (BENTYL) 20 MG tablet, TAKE 1 TABLET (20 MG TOTAL) BY MOUTH EVERY 6 (SIX) HOURS. (Patient taking differently: Take 20 mg by mouth every 6 (six) hours as needed for spasms (abdominal cramps).), Disp: 360 tablet, Rfl: 0   Docusate Sodium (DSS) 100 MG  CAPS, Take 1 capsule by mouth 2 (two) times daily., Disp: , Rfl:    EPINEPHrine 0.3 mg/0.3 mL IJ SOAJ injection, Inject 0.3 mg into the muscle as needed for anaphylaxis., Disp: 1 each, Rfl: 2   FLUoxetine (PROZAC) 20 MG capsule, Take 20 mg by mouth daily., Disp: , Rfl:    gabapentin (NEURONTIN) 300 MG capsule, TAKE 1 CAPSULE BY MOUTH THREE TIMES A DAY, Disp: 270 capsule, Rfl: 3   hydrOXYzine (ATARAX) 10 MG tablet, Take 10 mg by mouth 3 (three) times daily as needed., Disp: , Rfl:    insulin degludec (TRESIBA FLEXTOUCH) 200 UNIT/ML FlexTouch Pen, Inject 50-60 Units into the skin at bedtime. Prn as directed by PCP, Disp: 18 mL, Rfl: 0   Insulin Pen Needle (B-D UF III MINI PEN NEEDLES) 31G X 5 MM MISC, use as directed once daily Dx E11.8, Disp: 100 each, Rfl: 4   levothyroxine (SYNTHROID) 125 MCG tablet, Take 1 tablet (125 mcg total) by mouth daily., Disp: 90 tablet, Rfl: 3   linaclotide (LINZESS) 72 MCG capsule, Take 1 capsule (72 mcg total) by mouth daily before breakfast., Disp: 30 capsule, Rfl: 3   medroxyPROGESTERone (DEPO-PROVERA) 150 MG/ML injection, INJECT 1 ML (150 MG TOTAL) INTO THE MUSCLE EVERY 3 (THREE) MONTHS, Disp: 1 mL, Rfl: 2   Melatonin 2.5 MG CHEW, Chew by mouth., Disp: , Rfl:    Mepolizumab (NUCALA) 100 MG/ML SOAJ, Inject 1 mL (100 mg total) into the skin every 28 (twenty-eight) days., Disp: 3 mL, Rfl: 1   montelukast (SINGULAIR) 10 MG tablet, TAKE 1 TABLET BY MOUTH EVERYDAY AT BEDTIME, Disp: 90 tablet, Rfl: 1   nystatin cream (MYCOSTATIN), Apply 1 Application topically 2 (two) times daily. x2 weeks, Disp: 60 g, Rfl: 0   pioglitazone (ACTOS) 45 MG tablet, Take 1 tablet (45 mg total) by mouth daily., Disp: 90 tablet, Rfl: 3   Rimegepant Sulfate (NURTEC) 75 MG TBDP, Dissolve 1 tablet in mouth at onset of migraine headache. MAX 1 tablet per 24 hours., Disp: 10 tablet, Rfl: PRN   rosuvastatin (CRESTOR) 40 MG tablet, Take 1 tablet (40 mg total) by mouth at bedtime., Disp: 90 tablet, Rfl:  3   tirzepatide (MOUNJARO) 12.5 MG/0.5ML Pen, Inject 12.5 mg into the skin once a week., Disp: 6 mL, Rfl: 3   tirzepatide (MOUNJARO) 15 MG/0.5ML Pen, Inject 15 mg into the skin once weekly, Disp: 6 mL, Rfl: 3 Social History   Socioeconomic History   Marital status: Divorced    Spouse name: Not on file   Number of children: 1   Years of education: Not on file   Highest education level: Some college,  no degree  Occupational History   Not on file  Tobacco Use   Smoking status: Former    Current packs/day: 0.00    Average packs/day: 1 pack/day for 5.0 years (5.0 ttl pk-yrs)    Types: Cigarettes    Start date: 04/04/1988    Quit date: 04/04/1993    Years since quitting: 29.6   Smokeless tobacco: Never  Vaping Use   Vaping status: Every Day   Substances: CBD  Substance and Sexual Activity   Alcohol use: No   Drug use: Yes    Types: Marijuana   Sexual activity: Yes    Birth control/protection: Injection  Other Topics Concern   Not on file  Social History Narrative   Not on file   Social Determinants of Health   Financial Resource Strain: High Risk (11/02/2022)   Overall Financial Resource Strain (CARDIA)    Difficulty of Paying Living Expenses: Very hard  Food Insecurity: No Food Insecurity (11/02/2022)   Hunger Vital Sign    Worried About Running Out of Food in the Last Year: Never true    Ran Out of Food in the Last Year: Never true  Transportation Needs: No Transportation Needs (11/02/2022)   PRAPARE - Administrator, Civil Service (Medical): No    Lack of Transportation (Non-Medical): No  Physical Activity: Unknown (11/02/2022)   Exercise Vital Sign    Days of Exercise per Week: 0 days    Minutes of Exercise per Session: Not on file  Stress: Stress Concern Present (11/02/2022)   [employer] of Occupational Health - Occupational Stress Questionnaire    Feeling of Stress : Rather much  Social Connections: Socially Isolated (11/02/2022)   Social  Connection and Isolation Panel [NHANES]    Frequency of Communication with Friends and Family: More than three times a week    Frequency of Social Gatherings with Friends and Family: Never    Attends Religious Services: Never    Database administrator or Organizations: No    Attends Engineer, structural: Not on file    Marital Status: Divorced  Intimate Partner Violence: Unknown (08/20/2021)   Received from [employer], [facility]   HITS    Physically Hurt: Not on file    Insult or Talk Down To: Not on file    Threaten Physical Harm: Not on file    Scream or Curse: Not on file   Family History  Problem Relation Age of Onset   Asthma Mother    Bronchiolitis Mother    Atrial fibrillation Mother    GER disease Mother    Osteoporosis Mother    Osteoarthritis Mother    Dementia Mother        mild , was medically induced   Transient ischemic attack Mother        x 2   Aneurysm Mother        thoriatic   Bipolar disorder Mother    Depression Mother    Hyperlipidemia Mother    Non-Hodgkin's lymphoma Father    Hyperlipidemia Father    Breast cancer Paternal Grandmother    Lung cancer Paternal Grandmother    Mental illness Other    Diabetes Other    Esophageal cancer Other    Colon cancer Neg Hx    Stomach cancer Neg Hx    Pancreatic cancer Neg Hx    Colon polyps Neg Hx     Objective: Office vital signs reviewed. BP (!) 148/95   Pulse Marland Kitchen)  108   Temp 97.9 F (36.6 C) (Temporal)   Ht 5' 7.5" (1.715 m)   Wt 267 lb 12.8 oz (121.5 kg)   SpO2 98%   BMI 41.32 kg/m   Physical Examination:  General: Awake, alert, morbidly obese, No acute distress HEENT: sclera white/ MMM Cardio: regular rate and rhythm, S1S2 heard, no murmurs appreciated Pulm: clear to auscultation bilaterally, no wheezes, rhonchi or rales; normal work of breathing on room air     12/07/2022    3:14 PM 11/10/2022    9:52 AM 11/02/2022    3:24 PM  Depression screen PHQ 2/9  Decreased Interest  1 1 3   Down, Depressed, Hopeless 1 1 3   PHQ - 2 Score 2 2 6   Altered sleeping 0 1 3  Tired, decreased energy 1 2 3   Change in appetite 0 0 0  Feeling bad or failure about yourself  0 0 2  Trouble concentrating 2 3 3   Moving slowly or fidgety/restless 0 3 3  Suicidal thoughts 0 0 3  PHQ-9 Score 5 11 23   Difficult doing work/chores Somewhat difficult Not difficult at all       12/07/2022    3:14 PM 11/10/2022    9:53 AM 11/02/2022    3:24 PM 09/26/2021   12:13 PM  GAD 7 : Generalized Anxiety Score  Nervous, Anxious, on Edge 2 3 3 2   Control/stop worrying 1 0 3 0  Worry too much - different things 0 0 3 0  Trouble relaxing 1 0 3 2  Restless 0 2 2 2   Easily annoyed or irritable 1 1 3 2   Afraid - awful might happen 0 0 2 0  Total GAD 7 Score 5 6 19 8   Anxiety Difficulty Somewhat difficult Somewhat difficult Extremely difficult Somewhat difficult      Assessment/ Plan: 52 y.o. female   Attention deficit disorder (ADD) without hyperactivity - Plan: amphetamine-dextroamphetamine (ADDERALL XR) 25 MG 24 hr capsule  Elevated blood pressure reading in office with diagnosis of hypertension  ADHD improving but still not at goal.  Advance to 25 mg daily.  Up-to-date on UDS and CSC.  National narcotic database reviewed and there were no red flags.   Blood pressure is elevated above goal today.  Not sure if this is related to use of Adderall.  Will reevaluate in 1 month.  If persistently elevated, we are going to have to look for alternative means of treatment of ADHD  May follow-up in 1 month, sooner if concerns arise  No orders of the defined types were placed in this encounter.  No orders of the defined types were placed in this encounter.    Raliegh Ip, DO [facility] [phone]

## 2022-12-08 ENCOUNTER — Ambulatory Visit (INDEPENDENT_AMBULATORY_CARE_PROVIDER_SITE_OTHER): Payer: Medicaid Other

## 2022-12-08 DIAGNOSIS — Z3042 Encounter for surveillance of injectable contraceptive: Secondary | ICD-10-CM

## 2022-12-08 MED ORDER — MEDROXYPROGESTERONE ACETATE 150 MG/ML IM SUSP
150.0000 mg | INTRAMUSCULAR | Status: AC
Start: 2022-12-08 — End: 2023-12-03
  Administered 2022-12-08 – 2023-06-09 (×3): 150 mg via INTRAMUSCULAR

## 2022-12-08 NOTE — Progress Notes (Signed)
Medroxyprogesterone injection given to right upper outer quadrant.  Patient tolerated well. 

## 2022-12-09 ENCOUNTER — Other Ambulatory Visit: Payer: Self-pay

## 2022-12-09 ENCOUNTER — Other Ambulatory Visit (HOSPITAL_COMMUNITY): Payer: Self-pay

## 2022-12-09 NOTE — Telephone Encounter (Signed)
Encounter closed, injection was given on 12/08/22

## 2022-12-11 ENCOUNTER — Other Ambulatory Visit: Payer: Self-pay | Admitting: Family Medicine

## 2022-12-11 ENCOUNTER — Other Ambulatory Visit (HOSPITAL_COMMUNITY): Payer: Self-pay

## 2022-12-11 ENCOUNTER — Other Ambulatory Visit: Payer: Self-pay

## 2022-12-11 DIAGNOSIS — K580 Irritable bowel syndrome with diarrhea: Secondary | ICD-10-CM

## 2022-12-11 MED ORDER — DICYCLOMINE HCL 20 MG PO TABS
20.0000 mg | ORAL_TABLET | Freq: Four times a day (QID) | ORAL | 0 refills | Status: DC | PRN
  Filled 2022-12-11: qty 120, 30d supply, fill #0

## 2022-12-17 NOTE — Telephone Encounter (Signed)
Inbound call from carol williams with [facility] requesting clinic notes, barium swallow, manometry report and also ov notes be faxed over to the office to Dr Arlie Solomons attn. Please advise.

## 2022-12-17 NOTE — Telephone Encounter (Signed)
Documentation faxed to [phone], attn: Dr Arlie Solomons.  Note: patient no showed her barium swallow

## 2022-12-22 ENCOUNTER — Other Ambulatory Visit: Payer: Self-pay

## 2022-12-22 ENCOUNTER — Other Ambulatory Visit: Payer: Self-pay | Admitting: Family Medicine

## 2022-12-22 ENCOUNTER — Other Ambulatory Visit (HOSPITAL_COMMUNITY): Payer: Self-pay

## 2022-12-22 MED ORDER — CETIRIZINE HCL 10 MG PO TABS
10.0000 mg | ORAL_TABLET | Freq: Every day | ORAL | 3 refills | Status: DC | PRN
  Filled 2022-12-22: qty 90, 90d supply, fill #0
  Filled 2023-06-03: qty 90, 90d supply, fill #1

## 2022-12-24 ENCOUNTER — Telehealth: Payer: Self-pay | Admitting: Pulmonary Disease

## 2022-12-24 ENCOUNTER — Other Ambulatory Visit (HOSPITAL_COMMUNITY): Payer: Self-pay

## 2022-12-24 ENCOUNTER — Other Ambulatory Visit: Payer: Self-pay

## 2022-12-24 DIAGNOSIS — J4540 Moderate persistent asthma, uncomplicated: Secondary | ICD-10-CM

## 2022-12-24 MED ORDER — NUCALA 100 MG/ML ~~LOC~~ SOAJ
100.0000 mg | SUBCUTANEOUS | 0 refills | Status: DC
  Filled 2022-12-24: qty 3, 84d supply, fill #0

## 2022-12-24 NOTE — Telephone Encounter (Signed)
Refill sent for [facility] to [facility]: [phone]   Dose: 100 mg SQ every 4 weeks  Last OV: 07/03/22 Provider: Dr. Judeth Horn  Next OV: due, not yet schedule. Routing to sheduling team  Chesley Mires, PharmD, MPH, BCPS Clinical Pharmacist (Rheumatology and Pulmonology)

## 2022-12-25 ENCOUNTER — Telehealth: Payer: Medicaid Other | Admitting: Pulmonary Disease

## 2022-12-25 ENCOUNTER — Other Ambulatory Visit (HOSPITAL_COMMUNITY): Payer: Self-pay

## 2022-12-25 ENCOUNTER — Other Ambulatory Visit: Payer: Self-pay | Admitting: Pharmacist

## 2022-12-25 ENCOUNTER — Other Ambulatory Visit: Payer: Self-pay

## 2022-12-25 ENCOUNTER — Encounter: Payer: Self-pay | Admitting: Pulmonary Disease

## 2022-12-25 DIAGNOSIS — J4540 Moderate persistent asthma, uncomplicated: Secondary | ICD-10-CM

## 2022-12-25 DIAGNOSIS — J45909 Unspecified asthma, uncomplicated: Secondary | ICD-10-CM | POA: Diagnosis not present

## 2022-12-25 MED ORDER — NUCALA 100 MG/ML ~~LOC~~ SOAJ
100.0000 mg | SUBCUTANEOUS | 6 refills | Status: DC
  Filled 2022-12-25: qty 1, 28d supply, fill #0
  Filled 2023-01-20: qty 1, 28d supply, fill #1
  Filled 2023-02-15: qty 1, 28d supply, fill #2
  Filled 2023-03-22: qty 1, 28d supply, fill #3
  Filled 2023-04-12: qty 1, 28d supply, fill #4
  Filled 2023-05-25: qty 1, 28d supply, fill #5
  Filled 2023-06-22: qty 1, 28d supply, fill #6
  Filled 2023-07-23: qty 1, 28d supply, fill #7
  Filled 2023-08-19: qty 1, 28d supply, fill #8
  Filled 2023-09-14: qty 1, 28d supply, fill #9
  Filled 2023-10-20: qty 1, 28d supply, fill #10
  Filled 2023-11-15: qty 1, 28d supply, fill #11
  Filled 2023-12-15: qty 1, 28d supply, fill #12

## 2022-12-25 NOTE — Patient Instructions (Signed)
Nucala refilled  No changes to medicines  Return to clinic in 6 months or sooner as needed with Dr. Judeth Horn

## 2022-12-25 NOTE — Progress Notes (Signed)
Virtual Visit via Video Note  I connected with Judith [name] on 12/25/22 at  9:30 AM EDT by a video enabled telemedicine application and verified that I am speaking with the correct person using two identifiers.  Location: Patient: Home Provider: Office - [facility] - [address] [place], [address], [place], [place] [address]  I discussed the limitations of evaluation and management by telemedicine and the availability of in person appointments. The patient expressed understanding and agreed to proceed. I also discussed with the patient that there may be a patient responsible charge related to this service. The patient expressed understanding and agreed to proceed.  Patient consented to consult via telephone: Yes People present and their role in pt care: Pt   History of Present Illness:  52 y.o. woman with history of poorly controlled asthma now with much better control on Nucala whom we are seeing for routine follow-up. Most recent PCP note reviewed.   Doing well. No issues. Not really using inhalers. No rescue inhaler use in months.  Notes getting COVID in the interim, no major issue.. No breathing issues with recent URIs. Very pleased with her control.   Chief complaint:   asthma  Observations/Objective:  Social History   Tobacco Use  Smoking Status Former   Current packs/day: 0.00   Average packs/day: 1 pack/day for 5.0 years (5.0 ttl pk-yrs)   Types: Cigarettes   Start date: 04/04/1988   Quit date: 04/04/1993   Years since quitting: 29.7  Smokeless Tobacco Never   Immunization History  Administered Date(s) Administered   Influenza, Seasonal, Injecte, Preservative Fre 02/09/2018   Influenza,inj,Quad PF,6+ Mos 02/14/2017, 02/20/2019   Influenza-Unspecified 02/05/2020, 01/21/2021   MMR [birth year]/1971   Moderna Sars-Covid-2 Vaccination 10/02/2019, 11/01/2019, 04/18/2020, 11/26/2020   PNEUMOCOCCAL CONJUGATE-20 11/26/2020   Pneumococcal Polysaccharide-23 02/20/2019, 11/01/2019    Td 03/03/1985, 06/30/1994   Tdap 12/24/2014, 11/26/2020   Zoster Recombinant(Shingrix) 02/05/2020, 11/10/2022    Assessment and Plan:  Asthma: Previously persistent severe with multiple exacerbations a year. Started Nucala 10/2020. No exacerbations in interim since last visit.  Had 6-7 exacerbations in year preceding starting Nucala. Symptoms of cough and dyspnea improved.  To continue every 28 day Nucala shots, refill today.  To continue ICS/LABA therapy with Symbicort.  She reports no rescue inhaler use.   Follow Up Instructions:  Return in about 6 months (around 06/27/2023) for f/u Dr. Judeth Horn.    I discussed the assessment and treatment plan with the patient. The patient was provided an opportunity to ask questions and all were answered. The patient agreed with the plan and demonstrated an understanding of the instructions.   The patient was advised to call back or seek an in-person evaluation if the symptoms worsen or if the condition fails to improve as anticipated.   Karren Burly, MD

## 2022-12-28 ENCOUNTER — Other Ambulatory Visit (HOSPITAL_COMMUNITY): Payer: Self-pay

## 2023-01-05 ENCOUNTER — Telehealth: Payer: Self-pay | Admitting: Family Medicine

## 2023-01-05 NOTE — Telephone Encounter (Signed)
Patient has appt on 8/21 at 10:00 with PCP and said she needs a different day or time because she has another appt at 10:45 in [place]. Did not want the appt taken out until she spoke to PCP nurse. Please call back and advise.

## 2023-01-05 NOTE — Telephone Encounter (Signed)
Virtual fine

## 2023-01-05 NOTE — Telephone Encounter (Signed)
Called and spoke with pt - she is okay with switching it to a virtual apt

## 2023-01-06 ENCOUNTER — Encounter: Payer: Self-pay | Admitting: Family Medicine

## 2023-01-06 ENCOUNTER — Telehealth: Payer: Medicaid Other | Admitting: Family Medicine

## 2023-01-06 DIAGNOSIS — F988 Other specified behavioral and emotional disorders with onset usually occurring in childhood and adolescence: Secondary | ICD-10-CM

## 2023-01-06 DIAGNOSIS — G43009 Migraine without aura, not intractable, without status migrainosus: Secondary | ICD-10-CM

## 2023-01-06 MED ORDER — AMPHETAMINE-DEXTROAMPHET ER 30 MG PO CP24: 30.0000 mg | ORAL_CAPSULE | Freq: Every day | ORAL | 0 refills | Status: DC

## 2023-01-06 MED ORDER — AMPHETAMINE-DEXTROAMPHET ER 30 MG PO CP24
30.0000 mg | ORAL_CAPSULE | Freq: Every morning | ORAL | 0 refills | Status: DC
Start: 2023-01-06 — End: 2023-03-08

## 2023-01-06 NOTE — Progress Notes (Signed)
MyChart Video visit  Subjective: PP:IRJJ PCP: Raliegh Ip, DO HPI:Judith [name] is a 52 y.o. female. Patient provides verbal consent for consult held via video.  Due to COVID-19 pandemic this visit was conducted virtually. This visit type was conducted due to national recommendations for restrictions regarding the COVID-19 Pandemic (e.g. social distancing, sheltering in place) in an effort to limit this patient's exposure and mitigate transmission in our community. All issues noted in this document were discussed and addressed.  A physical exam was not performed with this format.   Location of patient: car Location of provider: [facility] Others present for call: none  1. ADHD She reports that adderall increased helped some but still not at goal. Wants to advance dose. No concerning side effects including sleep difficulty, anxiety, constipation (outside of baseline).  Seeing psych soon for prozac, which is working better than anything she has taken  2. Migraines Reports good relief of migraine with Nurtec.  Would like another sample as there is a filling problem with her pharmacy.   ROS: Per HPI  Allergies  Allergen Reactions   Diphenhydramine Other (See Comments)    Hair feels like it is crawling   Morphine Nausea And Vomiting    PCA PUMP/ DRIP -- N/V IV PUSH IN ER NO PROBLEM PER PT.     Prednisone Nausea And Vomiting   Adhesive [Tape]     Paper tape causes irritation    Erythromycin Hives   Latex    Metformin And Related     Diarrhea with IR and XR products   Sulfonamide Derivatives Other (See Comments)    Does not take due to family history   Past Medical History:  Diagnosis Date   ADD (attention deficit disorder) without hyperactivity    Allergy    Anxiety    Arthritis    Asthma    Bipolar 1 disorder (HCC)    Blood transfusion without reported diagnosis    with child birth 16 yrs ago    Bronchitis, chronic (HCC)    Constipation    COVID-19 virus infection  06/21/2020   DDD (degenerative disc disease), cervical    Depression    Diabetes mellitus without complication (HCC)    Dysphagia    Family history of adverse reaction to anesthesia    mother had a hard time waking up after knee surgery in 2021   Gastroparesis    GERD (gastroesophageal reflux disease)    History of kidney stones    History of MRSA infection    Hyperlipemia    Hypothyroid    Migraines    Pneumonia    Sleep apnea    no cpap     Current Outpatient Medications:    Accu-Chek FastClix Lancets MISC, Test blood sugars daily Dx E11.9, Disp: 102 each, Rfl: 3   ACCU-CHEK GUIDE test strip, TEST BLOOD SUGARS DAILY DX E11.9, Disp: 100 strip, Rfl: 3   albuterol (PROVENTIL) (2.5 MG/3ML) 0.083% nebulizer solution, Take 3 mLs (2.5 mg total) by nebulization every 6 (six) hours as needed for wheezing or shortness of breath. Fill upon patient request (Patient not taking: Reported on 12/25/2022), Disp: 75 mL, Rfl: 12   albuterol (VENTOLIN HFA) 108 (90 Base) MCG/ACT inhaler, Inhale 2 puffs into the lungs every 6 (six) hours as needed for wheezing or shortness of breath. Fill upon patient request (Patient not taking: Reported on 12/25/2022), Disp: 8 g, Rfl: 1   amphetamine-dextroamphetamine (ADDERALL XR) 25 MG 24 hr capsule, Take 1 capsule  by mouth in the morning., Disp: 30 capsule, Rfl: 0   azelastine (ASTELIN) 0.1 % nasal spray, Place 1 spray into both nostrils 2 (two) times daily., Disp: 30 mL, Rfl: 12   Blood Glucose Monitoring Suppl (ACCU-CHEK GUIDE ME) w/Device KIT, Use to check BS BID and as needed. DX: E11.9, Disp: 1 kit, Rfl: 0   cetirizine (ZYRTEC) 10 MG tablet, Take 1 tablet (10 mg total) by mouth daily as needed for allergies., Disp: 90 tablet, Rfl: 3   Continuous Glucose Sensor (FREESTYLE LIBRE 3 SENSOR) MISC, Place 1 sensor on the skin every 14 days. Use to check glucose continuously; DX:E11.65, Disp: 6 each, Rfl: PRN   cyclobenzaprine (FLEXERIL) 10 MG tablet, Take 1 tablet 3 times a  day by oral route as needed., Disp: , Rfl:    dexlansoprazole (DEXILANT) 60 MG capsule, Take 1 capsule (60 mg total) by mouth daily., Disp: 90 capsule, Rfl: 1   diclofenac Sodium (VOLTAREN) 1 % GEL, APPLY 2 GRAMS TO AFFECTED AREA 4 TIMES A DAY, Disp: 400 g, Rfl: 0   dicyclomine (BENTYL) 20 MG tablet, Take 1 tablet (20 mg) by mouth every 6 hours as needed for spasms (abdominal cramps)., Disp: 120 tablet, Rfl: 0   Docusate Sodium (DSS) 100 MG CAPS, Take 1 capsule by mouth 2 (two) times daily., Disp: , Rfl:    EPINEPHrine 0.3 mg/0.3 mL IJ SOAJ injection, Inject 0.3 mg into the muscle as needed for anaphylaxis., Disp: 1 each, Rfl: 2   FLUoxetine (PROZAC) 20 MG capsule, Take 20 mg by mouth daily., Disp: , Rfl:    gabapentin (NEURONTIN) 300 MG capsule, TAKE 1 CAPSULE BY MOUTH THREE TIMES A DAY, Disp: 270 capsule, Rfl: 3   hydrOXYzine (ATARAX) 10 MG tablet, Take 10 mg by mouth 3 (three) times daily as needed., Disp: , Rfl:    insulin degludec (TRESIBA FLEXTOUCH) 200 UNIT/ML FlexTouch Pen, Inject 50-60 Units into the skin at bedtime. Prn as directed by PCP, Disp: 18 mL, Rfl: 0   Insulin Pen Needle (B-D UF III MINI PEN NEEDLES) 31G X 5 MM MISC, use as directed once daily Dx E11.8, Disp: 100 each, Rfl: 4   levothyroxine (SYNTHROID) 125 MCG tablet, Take 1 tablet (125 mcg total) by mouth daily., Disp: 90 tablet, Rfl: 3   linaclotide (LINZESS) 72 MCG capsule, Take 1 capsule (72 mcg total) by mouth daily before breakfast., Disp: 30 capsule, Rfl: 3   medroxyPROGESTERone (DEPO-PROVERA) 150 MG/ML injection, INJECT 1 ML (150 MG TOTAL) INTO THE MUSCLE EVERY 3 (THREE) MONTHS, Disp: 1 mL, Rfl: 2   Melatonin 2.5 MG CHEW, Chew by mouth., Disp: , Rfl:    Mepolizumab (NUCALA) 100 MG/ML SOAJ, Inject 1 mL (100 mg total) into the skin every 28 (twenty-eight) days., Disp: 3 mL, Rfl: 6   montelukast (SINGULAIR) 10 MG tablet, TAKE 1 TABLET BY MOUTH EVERYDAY AT BEDTIME, Disp: 90 tablet, Rfl: 1   nystatin cream (MYCOSTATIN), Apply  1 Application topically 2 (two) times daily. x2 weeks, Disp: 60 g, Rfl: 0   pioglitazone (ACTOS) 45 MG tablet, Take 1 tablet (45 mg total) by mouth daily., Disp: 90 tablet, Rfl: 3   Rimegepant Sulfate (NURTEC) 75 MG TBDP, Dissolve 1 tablet in mouth at onset of migraine headache. MAX 1 tablet per 24 hours., Disp: 10 tablet, Rfl: PRN   rosuvastatin (CRESTOR) 40 MG tablet, Take 1 tablet (40 mg total) by mouth at bedtime., Disp: 90 tablet, Rfl: 3   tirzepatide (MOUNJARO) 12.5 MG/0.5ML Pen, Inject 12.5  mg into the skin once a week., Disp: 6 mL, Rfl: 3   tirzepatide (MOUNJARO) 15 MG/0.5ML Pen, Inject 15 mg into the skin once weekly, Disp: 6 mL, Rfl: 3  Current Facility-Administered Medications:    medroxyPROGESTERone (DEPO-PROVERA) injection 150 mg, 150 mg, Intramuscular, Q90 days, Geneve Kimpel M, DO, 150 mg at 12/08/22 1639  Gen: well appearing female NAD Psych: mood stable, speech normal, affect appropriate  Assessment/ Plan: 52 y.o. female   Attention deficit disorder (ADD) without hyperactivity - Plan: amphetamine-dextroamphetamine (ADDERALL XR) 30 MG 24 hr capsule  Migraine without aura and without status migrainosus, not intractable  Increase to 30mg  daily. Has follow up scheduled October.The Narcotic Database has been reviewed.  There were no red flags.    Responded well to PRN Nurtec. sample placed up front  Start time: 9:49a End time: 9:57a  Total time spent on patient care (including video visit/ documentation): 10 minutes  Amarius Toto Hulen Skains, DO [facility] [phone]

## 2023-01-13 ENCOUNTER — Encounter: Payer: Self-pay | Admitting: Family Medicine

## 2023-01-14 ENCOUNTER — Telehealth: Payer: Self-pay

## 2023-01-14 NOTE — Telephone Encounter (Signed)
Judith [name] (Key: [id number]) PA Case ID #: [id number] Need Help? Call us at [phone] Status sent iconSent to Plan today Drug FreeStyle Libre 3 Sensor ePA cloud logo Form [facility] [place] [employer] [place] Form [phone] NCPDP)

## 2023-01-15 ENCOUNTER — Other Ambulatory Visit: Payer: Self-pay

## 2023-01-15 ENCOUNTER — Telehealth: Payer: Self-pay | Admitting: Pharmacist

## 2023-01-15 ENCOUNTER — Other Ambulatory Visit (HOSPITAL_COMMUNITY): Payer: Self-pay

## 2023-01-15 NOTE — Telephone Encounter (Signed)
I'm assuming chart notes/specifics were not attached PA resubmitted with chart notes attached I will follow!

## 2023-01-15 NOTE — Telephone Encounter (Signed)
FYI: Denial reason at bottom of last note

## 2023-01-15 NOTE — Telephone Encounter (Signed)
  I'm assuming chart notes/specifics were not attached PA resubmitted with chart notes attached

## 2023-01-15 NOTE — Telephone Encounter (Signed)
Pharmacy Patient Advocate Encounter  Received notification from [facility] that Prior Authorization for FreeStyle Libre 3 Sensor has been DENIED.  See denial reason below. No denial letter attached in CMM. Will attache denial letter to Media tab once received.   PA #/Case ID/Reference #: [id number]   Denial Reason:  because we did not see what we need to approve the device you asked for, (Freestyle Libre 3 Sensor). We may be able to approve this device when we see certain records (documentation that you have been using the continuous glucose monitoring system as prescribed; or documentation that you have been able to  improve glycemic control).

## 2023-01-15 NOTE — Telephone Encounter (Signed)
This typical?

## 2023-01-19 ENCOUNTER — Other Ambulatory Visit: Payer: Self-pay

## 2023-01-20 ENCOUNTER — Other Ambulatory Visit: Payer: Self-pay

## 2023-01-21 ENCOUNTER — Encounter: Payer: Self-pay | Admitting: Pharmacist

## 2023-01-21 NOTE — Telephone Encounter (Signed)
PA approved for [facility] 3 CGM [place]! Will send patient a my chart message Routing to PCP as an [place]

## 2023-01-28 ENCOUNTER — Other Ambulatory Visit (HOSPITAL_COMMUNITY): Payer: Self-pay

## 2023-01-28 ENCOUNTER — Other Ambulatory Visit: Payer: Self-pay

## 2023-02-04 ENCOUNTER — Telehealth: Payer: Self-pay | Admitting: Pharmacist

## 2023-02-04 MED ORDER — FREESTYLE LIBRE 3 PLUS SENSOR MISC: 2.0000 | 5 refills | Status: DC

## 2023-02-04 NOTE — Telephone Encounter (Signed)
   Libre 3 not in stock at [facility] Will send in [place] 3 PLUS to [facility] [place] Patient made aware via my chart May require new PA Patient controlled on current regimen   Kieth Brightly, PharmD, BCACP Clinical Pharmacist, [facility]

## 2023-02-05 ENCOUNTER — Encounter: Payer: Self-pay | Admitting: Pharmacist

## 2023-02-05 ENCOUNTER — Other Ambulatory Visit (HOSPITAL_COMMUNITY): Payer: Self-pay

## 2023-02-08 ENCOUNTER — Other Ambulatory Visit: Payer: Self-pay | Admitting: Family Medicine

## 2023-02-08 ENCOUNTER — Other Ambulatory Visit: Payer: Self-pay

## 2023-02-08 ENCOUNTER — Other Ambulatory Visit (HOSPITAL_COMMUNITY): Payer: Self-pay

## 2023-02-08 DIAGNOSIS — E118 Type 2 diabetes mellitus with unspecified complications: Secondary | ICD-10-CM

## 2023-02-08 MED ORDER — FREESTYLE LIBRE 3 SENSOR MISC
99 refills | Status: DC
  Filled 2023-02-08: qty 2, 28d supply, fill #1
  Filled 2023-02-08: qty 2, 28d supply, fill #0
  Filled 2023-03-04: qty 2, 28d supply, fill #1

## 2023-02-09 ENCOUNTER — Ambulatory Visit
Admission: RE | Admit: 2023-02-09 | Discharge: 2023-02-09 | Disposition: A | Discharge status: Home / Self Care | Payer: Medicaid Other | Source: Ambulatory Visit | Attending: Emergency Medicine | Admitting: Emergency Medicine

## 2023-02-09 ENCOUNTER — Other Ambulatory Visit: Payer: Self-pay

## 2023-02-09 VITALS — BP 144/86 | HR 98 | Temp 98.5°F | Resp 18

## 2023-02-09 DIAGNOSIS — R110 Nausea: Secondary | ICD-10-CM

## 2023-02-09 DIAGNOSIS — N3000 Acute cystitis without hematuria: Secondary | ICD-10-CM

## 2023-02-09 LAB — POCT URINALYSIS DIP (MANUAL ENTRY)
Bilirubin, UA: NEGATIVE
Blood, UA: NEGATIVE
Glucose, UA: 500 mg/dL — AB
Ketones, POC UA: NEGATIVE mg/dL
Leukocytes, UA: NEGATIVE
Nitrite, UA: NEGATIVE
Protein Ur, POC: 30 mg/dL — AB
Spec Grav, UA: >=1.03 — AB (ref 1.010–1.025)
Urobilinogen, UA: 0.2 E.U./dL
pH, UA: 6 (ref 5.0–8.0)

## 2023-02-09 MED ORDER — ONDANSETRON 4 MG PO TBDP
4.0000 mg | ORAL_TABLET | Freq: Once | ORAL | Status: AC
  Administered 2023-02-09: 4 mg via ORAL

## 2023-02-09 MED ORDER — CIPROFLOXACIN HCL 250 MG PO TABS
250.0000 mg | ORAL_TABLET | Freq: Two times a day (BID) | ORAL | 0 refills | Status: AC
Start: 2023-02-09 — End: 2023-02-14

## 2023-02-09 MED ORDER — ONDANSETRON 4 MG PO TBDP: 4.0000 mg | ORAL_TABLET | Freq: Three times a day (TID) | ORAL | 0 refills | Status: DC | PRN

## 2023-02-09 NOTE — ED Triage Notes (Signed)
Pt reports concern for UTI. States she has been having discomfort since Sunday; similar to previous UTI's. Has also had nausea and chills. Reports some diarrhea since Thursday, but has hx of IBS. Had fever on Saturday.

## 2023-02-09 NOTE — Discharge Instructions (Addendum)
Common causes of urinary tract infections include but are not limited to holding your urine longer than you should, squatting instead of sitting down when urinating, sitting around in wet clothing such as a wet swimsuit or gym clothes too long, not emptying your bladder after having sexual intercourse, wiping from back to front instead of front to back after having a bowel movement.     Less common causes of urinary tract infections include but are not limited to anatomical shifts in the location of your bladder or uterus causing obstruction of passage of urine from your bladder to your urethra where your urine comes out or prolapse of your rectum into your vaginal wall.  These less common causes can be evaluated by gynecologist, a urologist or subspecialist called a uro-gynecologist   The urinalysis that we performed in the clinic today was abnormal.  Your urine contained glucose and protein which can cause burning with urination which often mimics a UTI.    Urine culture will be performed because of your history of complicated urinary tract infections.  The result of the urine culture will be available in the next 3 to 5 days and will be posted to your MyChart account.  If there is an abnormal finding, you will be contacted by phone and advised of further treatment recommendations, if any.   You were advised to begin antibiotics today based on clinical guidelines because you are having active symptoms of an acute lower urinary tract infection also known as cystitis.     Please pick up and begin taking your prescription for Ciprofloxacin 250 mg as soon as possible.  Please take all doses exactly as prescribed.  You can take this medication with or without food.  This medication is safe to take with your other medications.   If you have not had complete resolution of your symptoms after completing treatment as prescribed, please return to urgent care for repeat evaluation or follow-up with your primary  care provider.  Repeat urinalysis and urine culture may be indicated for more directed therapy.   Thank you for visiting [facility] today.  We appreciate the opportunity to participate in your care.

## 2023-02-09 NOTE — Telephone Encounter (Signed)
Patient has upcoming appointment with [facility] on 04/07/2023

## 2023-02-09 NOTE — ED Provider Notes (Signed)
[facility]    CSN: [id number] Arrival date & time: 02/09/23  1100    HISTORY   Chief Complaint  Patient presents with   Urinary Frequency    Pain in pelvic area , painful when urinate , chills - Entered by patient   HPI Judith [name] is a pleasant, 52 y.o. female who presents to urgent care today. Patient complains of 3-day history of burning with urination and suprapubic pain which she states is similar to when she has had UTIs in the past.  Patient states she is also noticed that she has been urinating more frequently.  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  Finally, she has on Saturday she had a subjective fever, did not check her temperature and has also been having nausea and chills.  Patient has a history of kidney stones. Patient denies abnormal vaginal discharge, abnormal vaginal odor, rash, incontinence of urine, flank pain, body aches, rigors, malaise, significant fatigue, and altered mental status.   Past Medical History:  Diagnosis Date   ADD (attention deficit disorder) without hyperactivity    Allergy    Anxiety    Arthritis    Asthma    Bipolar 1 disorder (HCC)    Blood transfusion without reported diagnosis    with child birth 16 yrs ago    Bronchitis, chronic (HCC)    Constipation    COVID-19 virus infection 06/21/2020   DDD (degenerative disc disease), cervical    Depression    Diabetes mellitus without complication (HCC)    Dysphagia    Family history of adverse reaction to anesthesia    mother had a hard time waking up after knee surgery in 2021   Gastroparesis    GERD (gastroesophageal reflux disease)    History of kidney stones    History of MRSA infection    Hyperlipemia    Hypothyroid    Migraines    Pneumonia    Sleep apnea    no cpap    Patient Active Problem List   Diagnosis Date Noted   Esophagogastric junction outflow obstruction    Gastric polyp    Cervical  myelopathy (HCC) 10/09/2020   Mild intermittent asthma with acute exacerbation 10/04/2020   Dysphagia    Patellofemoral pain syndrome of both knees 04/19/2020   Tinnitus of right ear 03/15/2020   Closed fracture of one rib of left side 03/15/2020   Concussion with no loss of consciousness 02/05/2020   Spondylosis of cervical spine 02/05/2020   Acute bilateral thoracic back pain 02/05/2020   Acute bilateral low back pain without sciatica 02/05/2020   Gastroparesis 11/03/2019   Hospital discharge follow-up 11/03/2019   DDD (degenerative disc disease), lumbar 06/30/2019   Lumbar back pain with radiculopathy affecting lower extremity 06/20/2019   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/21/2019   History of left knee replacement 02/10/2018   Morbid obesity (HCC) 11/21/2017   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   Episodic mood disorder (HCC) 05/12/2016   Controlled diabetes mellitus type 2 with complications (HCC) 05/12/2016   Gastroesophageal reflux disease without esophagitis 05/12/2016   DYSPNEA 07/22/2010   CHEST PAIN 07/22/2010   Hypothyroidism 09/13/2007   Hyperlipidemia 09/13/2007   Depression, major, single episode, moderate (HCC) 09/13/2007   MIGRAINE HEADACHE 09/13/2007   Essential hypertension 09/13/2007   Asthma 09/13/2007   CHOLELITHIASIS, HX OF 09/13/2007   NEPHROLITHIASIS, HX OF 09/13/2007  CARPAL TUNNEL RELEASE, HX OF 09/13/2007   Past Surgical History:  Procedure Laterality Date   ABDOMINAL SURGERY     ANTERIOR CERVICAL DECOMP/DISCECTOMY FUSION N/A 10/09/2020   Procedure: Anterior Cervical Discectomy Fusion Cervical five-six;  Surgeon: Bedelia Person, MD;  Location: [facility];  Service: Neurosurgery;  Laterality: N/A;   BIOPSY  02/04/2021   Procedure: BIOPSY;  Surgeon: Benancio Deeds, MD;  Location: [facility];  Service: Gastroenterology;;   BOTOX INJECTION N/A 02/04/2021   Procedure: BOTOX INJECTION;  Surgeon: Benancio Deeds, MD;  Location: [facility];  Service: Gastroenterology;  Laterality: N/A;   CARPAL TUNNEL RELEASE Bilateral 1999   CARPOMETACARPEL SUSPENSION PLASTY Left 08/05/2022   Procedure: Left thumb trapeziectomy and suspensionplasty;  Surgeon: Marlyne Beards, MD;  Location: [facility];  Service: Orthopedics;  Laterality: Left;  with regional block  120   CESAREAN SECTION     CHOLECYSTECTOMY     COLONOSCOPY     ESOPHAGEAL MANOMETRY N/A 07/08/2020   Procedure: ESOPHAGEAL MANOMETRY (EM);  Surgeon: Benancio Deeds, MD;  Location: [facility];  Service: Gastroenterology;  Laterality: N/A;   ESOPHAGOGASTRODUODENOSCOPY (EGD) WITH PROPOFOL N/A 02/04/2021   Procedure: ESOPHAGOGASTRODUODENOSCOPY (EGD) WITH PROPOFOL;  Surgeon: Benancio Deeds, MD;  Location: [facility];  Service: Gastroenterology;  Laterality: N/A;   REPLACEMENT TOTAL KNEE Bilateral    SCLEROTHERAPY  02/04/2021   Procedure: SCLEROTHERAPY;  Surgeon: Benancio Deeds, MD;  Location: [facility];  Service: Gastroenterology;;   THYROIDECTOMY     TONSILLECTOMY     TONSILLECTOMY AND ADENOIDECTOMY     TRIGGER FINGER RELEASE Left 06/16/2021   Procedure: LEFT THUMB TRIGGER FINGER RELEASE;  Surgeon: Oliver Barre, MD;  Location: [facility];  Service: Orthopedics;  Laterality: Left;   TRIGGER FINGER RELEASE Right 09/29/2021   Procedure: RELEASE TRIGGER FINGER/A-1 PULLEY;  Surgeon: Oliver Barre, MD;  Location: [facility];  Service: Orthopedics;  Laterality: Right;  Right ring, index and thumb trigger finger   UPPER GASTROINTESTINAL ENDOSCOPY     OB History     Gravida  1   Para  1   Term  1   Preterm      AB      Living         SAB      IAB      Ectopic      Multiple      Live Births             Home Medications    Prior to Admission medications   Medication Sig Start Date End Date Taking? Authorizing Provider  Accu-Chek FastClix Lancets MISC Test blood sugars daily Dx E11.9 11/27/21    Delynn Flavin M, DO  ACCU-CHEK GUIDE test strip TEST BLOOD SUGARS DAILY DX E11.9 08/10/22   Delynn Flavin M, DO  albuterol (PROVENTIL) (2.5 MG/3ML) 0.083% nebulizer solution Take 3 mLs (2.5 mg total) by nebulization every 6 (six) hours as needed for wheezing or shortness of breath. Fill upon patient request Patient not taking: Reported on 12/25/2022 09/26/20   Glenford Bayley, NP  albuterol (VENTOLIN HFA) 108 (90 Base) MCG/ACT inhaler Inhale 2 puffs into the lungs every 6 (six) hours as needed for wheezing or shortness of breath. Fill upon patient request Patient not taking: Reported on 12/25/2022 09/26/20   Glenford Bayley, NP  amphetamine-dextroamphetamine (ADDERALL XR) 30 MG 24 hr capsule Take 1 capsule (30 mg total) by mouth in the morning. 01/06/23   Raliegh Ip,  DO  amphetamine-dextroamphetamine (ADDERALL XR) 30 MG 24 hr capsule Take 1 capsule (30 mg total) by mouth daily. 03/07/23   Raliegh Ip, DO  amphetamine-dextroamphetamine (ADDERALL XR) 30 MG 24 hr capsule Take 1 capsule (30 mg total) by mouth daily. 02/05/23   Raliegh Ip, DO  azelastine (ASTELIN) 0.1 % nasal spray Place 1 spray into both nostrils 2 (two) times daily. 11/02/22   Raliegh Ip, DO  Blood Glucose Monitoring Suppl (ACCU-CHEK GUIDE ME) w/Device KIT Use to check BS BID and as needed. DX: E11.9 01/26/22   Raliegh Ip, DO  cetirizine (ZYRTEC) 10 MG tablet Take 1 tablet (10 mg total) by mouth daily as needed for allergies. 12/22/22   Raliegh Ip, DO  Continuous Glucose Sensor (FREESTYLE LIBRE 3 PLUS SENSOR) MISC 2 each by Does not apply route See admin instructions. Apply sensor to the back of arm every 15 days. DX:E11.65 02/04/23   Raliegh Ip, DO  Continuous Glucose Sensor (FREESTYLE LIBRE 3 SENSOR) MISC Use to check blood glucose continously, change sensor every 14 days 03/24/22     cyclobenzaprine (FLEXERIL) 10 MG tablet Take 1 tablet 3 times a day by oral route as needed.  07/10/20   [provider]  dexlansoprazole (DEXILANT) 60 MG capsule Take 1 capsule (60 mg total) by mouth daily. 11/17/22   Armbruster, Willaim Rayas, MD  diclofenac Sodium (VOLTAREN) 1 % GEL APPLY 2 GRAMS TO AFFECTED AREA 4 TIMES A DAY 07/27/22   Gottschalk, Kathie Rhodes M, DO  dicyclomine (BENTYL) 20 MG tablet Take 1 tablet (20 mg) by mouth every 6 hours as needed for spasms (abdominal cramps). 12/11/22   Raliegh Ip, DO  Docusate Sodium (DSS) 100 MG CAPS Take 1 capsule by mouth 2 (two) times daily.    [provider]  EPINEPHrine 0.3 mg/0.3 mL IJ SOAJ injection Inject 0.3 mg into the muscle as needed for anaphylaxis. 11/25/22   Hunsucker, Lesia Sago, MD  FLUoxetine (PROZAC) 20 MG capsule Take 20 mg by mouth daily. 11/07/22   [provider]  gabapentin (NEURONTIN) 300 MG capsule TAKE 1 CAPSULE BY MOUTH THREE TIMES A DAY 11/02/22   Delynn Flavin M, DO  hydrOXYzine (ATARAX) 10 MG tablet Take 10 mg by mouth 3 (three) times daily as needed.    [provider]  insulin degludec (TRESIBA FLEXTOUCH) 200 UNIT/ML FlexTouch Pen INJECT 50-60 UNITS INTO THE SKIN AT BEDTIME AS NEEDED AS DIRECTED BY PCP 02/09/23   Delynn Flavin M, DO  Insulin Pen Needle (B-D UF III MINI PEN NEEDLES) 31G X 5 MM MISC use as directed once daily Dx E11.8 02/10/22   Raliegh Ip, DO  levothyroxine (SYNTHROID) 125 MCG tablet Take 1 tablet (125 mcg total) by mouth daily. 11/02/22   Raliegh Ip, DO  linaclotide (LINZESS) 72 MCG capsule Take 1 capsule (72 mcg total) by mouth daily before breakfast. 02/10/22   Delynn Flavin M, DO  medroxyPROGESTERone (DEPO-PROVERA) 150 MG/ML injection INJECT 1 ML (150 MG TOTAL) INTO THE MUSCLE EVERY 3 (THREE) MONTHS 11/02/22   Raliegh Ip, DO  Melatonin 2.5 MG CHEW Chew by mouth.    [provider]  Mepolizumab (NUCALA) 100 MG/ML SOAJ Inject 1 mL (100 mg total) into the skin every 28 (twenty-eight) days. 12/25/22   Hunsucker, Lesia Sago, MD   montelukast (SINGULAIR) 10 MG tablet TAKE 1 TABLET BY MOUTH EVERYDAY AT BEDTIME 10/14/22   Delynn Flavin M, DO  nystatin cream (MYCOSTATIN) Apply 1 Application  topically 2 (two) times daily. x2 weeks 12/26/21   Delynn Flavin M, DO  pioglitazone (ACTOS) 45 MG tablet Take 1 tablet (45 mg total) by mouth daily. 11/02/22   Raliegh Ip, DO  Rimegepant Sulfate (NURTEC) 75 MG TBDP Dissolve 1 tablet in mouth at onset of migraine headache. MAX 1 tablet per 24 hours. 11/10/22   Raliegh Ip, DO  rosuvastatin (CRESTOR) 40 MG tablet Take 1 tablet (40 mg total) by mouth at bedtime. 11/02/22   Raliegh Ip, DO  tirzepatide [facility]) 12.5 MG/0.5ML Pen Inject 12.5 mg into the skin once a week. 10/22/22   Raliegh Ip, DO  tirzepatide [facility]) 15 MG/0.5ML Pen Inject 15 mg into the skin once a week. 10/13/22       Family History Family History  Problem Relation Age of Onset   Asthma Mother    Bronchiolitis Mother    Atrial fibrillation Mother    GER disease Mother    Osteoporosis Mother    Osteoarthritis Mother    Dementia Mother        mild , was medically induced   Transient ischemic attack Mother        x 2   Aneurysm Mother        thoriatic   Bipolar disorder Mother    Depression Mother    Hyperlipidemia Mother    Non-Hodgkin's lymphoma Father    Hyperlipidemia Father    Breast cancer Paternal Grandmother    Lung cancer Paternal Grandmother    Mental illness Other    Diabetes Other    Esophageal cancer Other    Colon cancer Neg Hx    Stomach cancer Neg Hx    Pancreatic cancer Neg Hx    Colon polyps Neg Hx    Social History Social History   Tobacco Use   Smoking status: Former    Current packs/day: 0.00    Average packs/day: 1 pack/day for 5.0 years (5.0 ttl pk-yrs)    Types: Cigarettes    Start date: 04/04/1988    Quit date: 04/04/1993    Years since quitting: 29.8   Smokeless tobacco: Never  Vaping Use   Vaping status: Every Day   Substances:  CBD  Substance Use Topics   Alcohol use: No   Drug use: Yes    Types: Marijuana   Allergies   Diphenhydramine, Morphine, Prednisone, Adhesive [tape], Erythromycin, Latex, Metformin and related, and Sulfonamide derivatives  Review of Systems Review of Systems Pertinent findings revealed after performing a 14 point review of systems has been noted in the history of present illness.  Physical Exam Vital Signs BP (!) 144/86 (BP Location: Right Arm)   Pulse 98   Temp 98.5 F (36.9 C) (Oral)   Resp 18   SpO2 96%   No data found.  Physical Exam Vitals and nursing note reviewed.  Constitutional:      General: She is not in acute distress.    Appearance: Normal appearance. She is not ill-appearing.  HENT:     Head: Normocephalic and atraumatic.  Eyes:     General: Lids are normal.        Right eye: No discharge.        Left eye: No discharge.     Extraocular Movements: Extraocular movements intact.     Conjunctiva/sclera: Conjunctivae normal.     Right eye: Right conjunctiva is not injected.     Left eye: Left conjunctiva is not injected.  Neck:  Trachea: Trachea and phonation normal.  Cardiovascular:     Rate and Rhythm: Normal rate and regular rhythm.     Pulses: Normal pulses.     Heart sounds: Normal heart sounds. No murmur heard.    No friction rub. No gallop.  Pulmonary:     Effort: Pulmonary effort is normal. No accessory muscle usage, prolonged expiration or respiratory distress.     Breath sounds: Normal breath sounds. No stridor, decreased air movement or transmitted upper airway sounds. No decreased breath sounds, wheezing, rhonchi or rales.  Chest:     Chest wall: No tenderness.  Abdominal:     General: Abdomen is flat. Bowel sounds are normal. There is no distension.     Palpations: Abdomen is soft.     Tenderness: There is abdominal tenderness in the suprapubic area. There is no right CVA tenderness or left CVA tenderness.     Hernia: No hernia is  present.  Musculoskeletal:        General: Normal range of motion.     Cervical back: Normal range of motion and neck supple. Normal range of motion.  Lymphadenopathy:     Cervical: No cervical adenopathy.  Skin:    General: Skin is warm and dry.     Findings: No erythema or rash.  Neurological:     General: No focal deficit present.     Mental Status: She is alert and oriented to person, place, and time.  Psychiatric:        Mood and Affect: Mood normal.        Behavior: Behavior normal.     Visual Acuity Right Eye Distance:   Left Eye Distance:   Bilateral Distance:    Right Eye Near:   Left Eye Near:    Bilateral Near:     UC Couse / Diagnostics / Procedures:     Radiology No results found.  Procedures Procedures (including critical care time) EKG  Pending results:  Labs Reviewed  POCT URINALYSIS DIP (MANUAL ENTRY) - Abnormal; Notable for the following components:      Result Value   Glucose, UA =500 (*)    Spec Grav, UA >=1.030 (*)    Protein Ur, POC =30 (*)    All other components within normal limits  URINE CULTURE    Medications Ordered in UC: Medications  ondansetron (ZOFRAN-ODT) disintegrating tablet 4 mg (4 mg Oral Given 02/09/23 1138)    UC Diagnoses / Final Clinical Impressions(s)   I have reviewed the triage vital signs and the nursing notes.  Pertinent labs & imaging results that were available during my care of the patient were reviewed by me and considered in my medical decision making (see chart for details).    Final diagnoses:  Acute cystitis without hematuria  Nausea without vomiting   Urine dip today revealed glucosuria and protein.  Due to patient's reported complicated history of urinary tract infections, we will perform urine culture and notify her of the results once complete.  If the culture is negative, she can discontinue ciprofloxacin.  Patient was advised to begin antibiotics today due to having active symptoms of urinary  tract infection.                    Patient was advised to take all doses exactly as prescribed.  Patient also advised of risks of worsening infection with incomplete antibiotic therapy. Patient advised that they will be contacted with results and that adjustments to treatment will be  provided as indicated based on the results.   Patient was advised of possibility that urine culture results may be negative if sample provided was obtained late in the day causing urine to be more diluted.  Patient was advised that if antibiotics were effective after the first 24 to 36 hours, despite negative urine culture result, it is recommended that they complete the full course as prescribed.   Zofran was provided for nausea. Return precautions advised.  Please see discharge instructions below for details of plan of care as provided to patient. ED Prescriptions     Medication Sig Dispense Auth. Provider   ciprofloxacin (CIPRO) 250 MG tablet Take 1 tablet (250 mg total) by mouth 2 (two) times daily for 5 days. 10 tablet Theadora Rama Scales, PA-C   ondansetron (ZOFRAN-ODT) 4 MG disintegrating tablet Take 1 tablet (4 mg total) by mouth every 8 (eight) hours as needed for nausea or vomiting. 20 tablet Theadora Rama Scales, PA-C      PDMP not reviewed this encounter.  Disposition Upon Discharge:  Condition: stable for discharge home  Patient presented with concern for an acute illness with associated systemic symptoms and significant discomfort requiring urgent management. In my opinion, this is a condition that a prudent lay person (someone who possesses an average knowledge of health and medicine) may potentially expect to result in complications if not addressed urgently such as respiratory distress, impairment of bodily function or dysfunction of bodily organs.   As such, the patient has been evaluated and assessed, work-up was performed and treatment was provided in alignment with urgent care protocols  and evidence based medicine.  Patient/parent/caregiver has been advised that the patient may require follow up for further testing and/or treatment if the symptoms continue in spite of treatment, as clinically indicated and appropriate.  Routine symptom specific, illness specific and/or disease specific instructions were discussed with the patient and/or caregiver at length.  Prevention strategies for avoiding STD exposure were also discussed.  The patient will follow up with their current PCP if and as advised. If the patient does not currently have a PCP we will assist them in obtaining one.   The patient may need specialty follow up if the symptoms continue, in spite of conservative treatment and management, for further workup, evaluation, consultation and treatment as clinically indicated and appropriate.  Patient/parent/caregiver verbalized understanding and agreement of plan as discussed.  All questions were addressed during visit.  Please see discharge instructions below for further details of plan.  Discharge Instructions:   Discharge Instructions      Common causes of urinary tract infections include but are not limited to holding your urine longer than you should, squatting instead of sitting down when urinating, sitting around in wet clothing such as a wet swimsuit or gym clothes too long, not emptying your bladder after having sexual intercourse, wiping from back to front instead of front to back after having a bowel movement.     Less common causes of urinary tract infections include but are not limited to anatomical shifts in the location of your bladder or uterus causing obstruction of passage of urine from your bladder to your urethra where your urine comes out or prolapse of your rectum into your vaginal wall.  These less common causes can be evaluated by gynecologist, a urologist or subspecialist called a uro-gynecologist   The urinalysis that we performed in the clinic today  was abnormal.  Your urine contained glucose and protein which can cause burning with  urination which often mimics a UTI.    Urine culture will be performed because of your history of complicated urinary tract infections.  The result of the urine culture will be available in the next 3 to 5 days and will be posted to your MyChart account.  If there is an abnormal finding, you will be contacted by phone and advised of further treatment recommendations, if any.   You were advised to begin antibiotics today based on clinical guidelines because you are having active symptoms of an acute lower urinary tract infection also known as cystitis.     Please pick up and begin taking your prescription for Ciprofloxacin 250 mg as soon as possible.  Please take all doses exactly as prescribed.  You can take this medication with or without food.  This medication is safe to take with your other medications.   If you have not had complete resolution of your symptoms after completing treatment as prescribed, please return to urgent care for repeat evaluation or follow-up with your primary care provider.  Repeat urinalysis and urine culture may be indicated for more directed therapy.   Thank you for visiting [facility] today.  We appreciate the opportunity to participate in your care.       This office note has been dictated using Teaching laboratory technician.  Unfortunately, this method of dictation can sometimes lead to typographical or grammatical errors.  I apologize for your inconvenience in advance if this occurs.  Please do not hesitate to reach out to me if clarification is needed.       Theadora Rama Scales, [place] 02/09/23 1138

## 2023-02-11 LAB — URINE CULTURE: Culture: 30000 — AB

## 2023-02-12 LAB — URINE CULTURE

## 2023-02-15 ENCOUNTER — Other Ambulatory Visit: Payer: Self-pay

## 2023-02-15 NOTE — Progress Notes (Signed)
Specialty Pharmacy Refill Coordination Note  Judith [name] is a 52 y.o. female contacted today regarding refills of specialty medication(s) Mepolizumab .  Patient requested Delivery  on 02/25/23  to verified address [address] [place] [address]   Medication will be filled on 02/24/23.

## 2023-02-17 ENCOUNTER — Other Ambulatory Visit: Payer: Self-pay | Admitting: Family Medicine

## 2023-02-17 DIAGNOSIS — E118 Type 2 diabetes mellitus with unspecified complications: Secondary | ICD-10-CM

## 2023-02-22 ENCOUNTER — Encounter: Payer: Self-pay | Admitting: Nurse Practitioner

## 2023-02-22 ENCOUNTER — Ambulatory Visit: Payer: Medicaid Other | Admitting: Nurse Practitioner

## 2023-02-22 ENCOUNTER — Telehealth: Payer: Self-pay | Admitting: Family Medicine

## 2023-02-22 VITALS — BP 128/81 | HR 89 | Temp 97.2°F | Ht 67.0 in | Wt 259.2 lb

## 2023-02-22 DIAGNOSIS — K2100 Gastro-esophageal reflux disease with esophagitis, without bleeding: Secondary | ICD-10-CM

## 2023-02-22 NOTE — Progress Notes (Signed)
Acute Office Visit  Subjective:     Patient ID: Judith [name], female    DOB: [birth year], 52 y.o.   MRN: [id number]  Chief Complaint  Patient presents with   Gastroesophageal Reflux    Had a uti and was on antibiotic and once she finished antibiotic last week she has had bad acid reflux, belching    Judith [name] is a 52 yrs old female here today for an acute visit concerns of GERD. Past hx of H-pylori was treated . Currently on Dexlansoprazole 60 mg an bentyl 20 mg. She reports increase production of acid, burning, belching since finishing ATB for UTI. She has hx of gastric polyp and has an appointment comking in Nov at [facility]. She was started on Adderall explained to her Adderall can increase stomach acid production, which may exacerbate GERD symptoms.  Gastroesophageal Reflux She reports no chest pain, no coughing, no heartburn, no sore throat or no wheezing. This is a chronic problem. The current episode started 1 to 4 weeks ago. The problem occurs constantly. The problem has been gradually worsening. Nothing aggravates the symptoms. Risk factors include obesity and lack of exercise. She has tried a PPI for the symptoms. The treatment provided mild relief.    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   Episodic mood disorder (HCC) 05/12/2016   Controlled diabetes mellitus type 2 with complications (HCC) 05/12/2016   Gastroesophageal reflux disease without esophagitis 05/12/2016   Morbid obesity (HCC) 11/21/2017   History of left knee replacement  02/10/2018   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/21/2019   Lumbar back pain with radiculopathy affecting lower extremity 06/20/2019   DDD (degenerative disc disease), lumbar 06/30/2019   Gastroparesis 11/03/2019   Hospital discharge follow-up 11/03/2019   Concussion with no loss of consciousness 02/05/2020   Spondylosis of cervical spine 02/05/2020   Acute bilateral thoracic back pain 02/05/2020   Acute bilateral low back pain without sciatica 02/05/2020   Tinnitus of right ear 03/15/2020   Closed fracture of one rib of left side 03/15/2020   Patellofemoral pain syndrome of both knees 04/19/2020   Dysphagia    Mild intermittent asthma with acute exacerbation 10/04/2020   Cervical myelopathy (HCC) 10/09/2020   Esophagogastric junction outflow obstruction    Gastric polyp    Gastroesophageal reflux disease with esophagitis without hemorrhage 02/22/2023   Resolved Ambulatory Problems    Diagnosis Date Noted   Arthropathy 09/13/2007   Primary osteoarthritis of left knee 09/22/2017   Primary osteoarthritis of right knee 09/22/2017   Acute cystitis without hematuria 03/21/2018   Status post right knee replacement 11/16/2018   Type 1 diabetes mellitus with hyperglycemia (HCC) 03/21/2019   Past Medical History:  Diagnosis Date   ADD (attention deficit disorder) without hyperactivity    Allergy    Anxiety    Arthritis    Bipolar 1 disorder (HCC)    Blood transfusion without reported diagnosis    Bronchitis, chronic (HCC)    Constipation    COVID-19 virus infection 06/21/2020   DDD (degenerative disc disease), cervical  Depression    Diabetes mellitus without complication (HCC)    Family history of adverse reaction to anesthesia    GERD (gastroesophageal reflux disease)    History of kidney stones    History of MRSA infection    Hyperlipemia    Hypothyroid    Migraines    Pneumonia    Sleep apnea     Review of Systems  Constitutional:  Negative for chills and  fever.  HENT:  Negative for hearing loss and sore throat.   Respiratory:  Negative for cough and wheezing.   Cardiovascular:  Negative for chest pain and leg swelling.  Gastrointestinal:  Negative for heartburn.  Musculoskeletal:  Negative for falls and neck pain.  Skin:  Negative for itching and rash.  Neurological:  Negative for dizziness and tingling.  Endo/Heme/Allergies:  Negative for environmental allergies and polydipsia. Does not bruise/bleed easily.   Negative unless indicated in HPI    Objective:    BP 128/81   Pulse 89   Temp (!) 97.2 F (36.2 C) (Temporal)   Ht 5\' 7"  (1.702 m)   Wt 259 lb 3.2 oz (117.6 kg)   SpO2 99%   BMI 40.60 kg/m  BP Readings from Last 3 Encounters:  02/22/23 128/81  02/09/23 (!) 144/86  12/07/22 (!) 148/95   Wt Readings from Last 3 Encounters:  02/22/23 259 lb 3.2 oz (117.6 kg)  12/07/22 267 lb 12.8 oz (121.5 kg)  11/17/22 272 lb (123.4 kg)    Physical Exam Vitals and nursing note reviewed.  Constitutional:      Appearance: She is obese.  HENT:     Head: Normocephalic and atraumatic.  Eyes:     General: No scleral icterus.    Extraocular Movements: Extraocular movements intact.     Conjunctiva/sclera: Conjunctivae normal.     Pupils: Pupils are equal, round, and reactive to light.  Cardiovascular:     Rate and Rhythm: Normal rate and regular rhythm.  Pulmonary:     Effort: Pulmonary effort is normal.     Breath sounds: Normal breath sounds.  Abdominal:     General: Bowel sounds are normal. There is distension.     Palpations: Abdomen is soft.  Musculoskeletal:        General: Normal range of motion.     Right lower leg: No edema.     Left lower leg: No edema.  Skin:    General: Skin is warm and dry.     Findings: No rash.  Neurological:     Mental Status: She is oriented to person, place, and time. Mental status is at baseline.  Psychiatric:        Mood and Affect: Mood normal.        Behavior: Behavior normal.         Thought Content: Thought content normal.        Judgment: Judgment normal.     No results found for any visits on 02/22/23.     Assessment & Plan:  Gastroesophageal reflux disease with esophagitis without hemorrhage   Judith [name] is 52 yrs old female, no acute distress GERD: Continue Bentyl 20 mg daily and Dexlansoprazole 60 mg daily F/u with [facility] in 3-weeks Small meals, avoid caffeine, citrus, spicy food.  Stay upright after eating  ncourage healthy lifestyle choices, including diet (rich in fruits, vegetables, and lean proteins, and low in salt and simple carbohydrates) and exercise (at least 30 minutes of moderate physical activity daily).     The  above assessment and management plan was discussed with the patient. The patient verbalized understanding of and has agreed to the management plan. Patient is aware to call the clinic if they develop any new symptoms or if symptoms persist or worsen. Patient is aware when to return to the clinic for a follow-up visit. Patient educated on when it is appropriate to go to the emergency department.  Return for as already schedule with PCP.  Arrie Aran Santa Lighter, DNP [facility] [address] [place], [place] [address] [phone]

## 2023-02-23 ENCOUNTER — Encounter: Payer: Self-pay | Admitting: Pharmacist

## 2023-02-23 NOTE — Telephone Encounter (Signed)
Mychart message sent to patient.

## 2023-02-24 ENCOUNTER — Ambulatory Visit: Payer: Medicaid Other

## 2023-02-24 ENCOUNTER — Other Ambulatory Visit: Payer: Self-pay

## 2023-03-01 ENCOUNTER — Telehealth: Payer: Self-pay | Admitting: Family Medicine

## 2023-03-01 ENCOUNTER — Other Ambulatory Visit: Payer: Self-pay

## 2023-03-01 ENCOUNTER — Other Ambulatory Visit (HOSPITAL_COMMUNITY): Payer: Self-pay

## 2023-03-01 NOTE — Progress Notes (Signed)
PA renewal initiated automatically by CoverMyMeds.  Attempted to submit a Prior Authorization request to [facility] for NUCALA via CoverMyMeds, however request was cancelled stating "member's coverage has terminated" and to "Resubmit using active member ID information".   Attempted to verify with a test claim, however I only received a rejection for the refill being to soon--no mention was made of member's coverage being terminated (see below). Eligibility check still populates the information that was being used both for the PA request and for the test claim. Will need to wait until pt is able to have medication refilled to fully confirm actual status of pt's insurance.

## 2023-03-02 ENCOUNTER — Ambulatory Visit: Payer: Medicaid Other

## 2023-03-02 DIAGNOSIS — Z3042 Encounter for surveillance of injectable contraceptive: Secondary | ICD-10-CM

## 2023-03-02 DIAGNOSIS — M542 Cervicalgia: Secondary | ICD-10-CM | POA: Insufficient documentation

## 2023-03-02 NOTE — Telephone Encounter (Signed)
Letter completed.

## 2023-03-02 NOTE — Progress Notes (Signed)
Medroxyprogesterone injection given to left upper outer quadrant.  Patient tolerated well.

## 2023-03-04 ENCOUNTER — Other Ambulatory Visit: Payer: Self-pay

## 2023-03-08 ENCOUNTER — Telehealth (INDEPENDENT_AMBULATORY_CARE_PROVIDER_SITE_OTHER): Payer: Medicaid Other | Admitting: Family Medicine

## 2023-03-08 ENCOUNTER — Encounter: Payer: Self-pay | Admitting: Family Medicine

## 2023-03-08 VITALS — BP 124/84 | Ht 66.5 in | Wt 255.6 lb

## 2023-03-08 DIAGNOSIS — M5030 Other cervical disc degeneration, unspecified cervical region: Secondary | ICD-10-CM

## 2023-03-08 DIAGNOSIS — F988 Other specified behavioral and emotional disorders with onset usually occurring in childhood and adolescence: Secondary | ICD-10-CM | POA: Diagnosis not present

## 2023-03-08 MED ORDER — AMPHETAMINE-DEXTROAMPHET ER 20 MG PO CP24: 40.0000 mg | ORAL_CAPSULE | Freq: Every day | ORAL | 0 refills | Status: DC

## 2023-03-08 MED ORDER — AMPHETAMINE-DEXTROAMPHET ER 20 MG PO CP24: 40.0000 mg | ORAL_CAPSULE | Freq: Every morning | ORAL | 0 refills | Status: DC
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
## 2023-03-12 ENCOUNTER — Other Ambulatory Visit: Payer: Self-pay

## 2023-03-22 ENCOUNTER — Other Ambulatory Visit: Payer: Self-pay

## 2023-03-22 NOTE — Progress Notes (Signed)
Received notification from Raimon at [facility] that patient's medication has processed without issues. Nothing further required at this time.

## 2023-03-22 NOTE — Progress Notes (Signed)
Specialty Pharmacy Refill Coordination Note  Judith [name] is a 52 y.o. female contacted today regarding refills of specialty medication(s) Mepolizumab   Patient requested Delivery   Delivery date: 03/26/23   Verified address: [address]  [place] [address]   Medication will be filled on 03/25/23.

## 2023-03-28 ENCOUNTER — Other Ambulatory Visit: Payer: Self-pay | Admitting: Family Medicine

## 2023-03-28 ENCOUNTER — Other Ambulatory Visit (HOSPITAL_COMMUNITY): Payer: Self-pay

## 2023-03-29 ENCOUNTER — Other Ambulatory Visit: Payer: Self-pay

## 2023-03-29 ENCOUNTER — Other Ambulatory Visit: Payer: Self-pay | Admitting: Family Medicine

## 2023-03-29 ENCOUNTER — Other Ambulatory Visit (HOSPITAL_COMMUNITY): Payer: Self-pay

## 2023-03-29 DIAGNOSIS — F40232 Fear of other medical care: Secondary | ICD-10-CM | POA: Insufficient documentation

## 2023-03-29 MED ORDER — HYDROXYZINE HCL 10 MG PO TABS
10.0000 mg | ORAL_TABLET | Freq: Three times a day (TID) | ORAL | 0 refills | Status: DC | PRN
  Filled 2023-03-29: qty 30, 10d supply, fill #0

## 2023-03-29 MED FILL — Continuous Glucose System Sensor: Qty: 2 | Fill #0 | Status: CN

## 2023-03-30 ENCOUNTER — Other Ambulatory Visit: Payer: Self-pay

## 2023-03-30 MED FILL — Continuous Glucose System Sensor: 28 days supply | Qty: 2 | Fill #0 | Status: AC

## 2023-04-06 ENCOUNTER — Ambulatory Visit: Payer: Medicaid Other

## 2023-04-09 ENCOUNTER — Other Ambulatory Visit (HOSPITAL_COMMUNITY): Payer: Self-pay

## 2023-04-09 ENCOUNTER — Other Ambulatory Visit: Payer: Self-pay

## 2023-04-09 DIAGNOSIS — M654 Radial styloid tenosynovitis [de Quervain]: Secondary | ICD-10-CM | POA: Insufficient documentation

## 2023-04-11 ENCOUNTER — Other Ambulatory Visit: Payer: Self-pay | Admitting: Family Medicine

## 2023-04-11 DIAGNOSIS — E109 Type 1 diabetes mellitus without complications: Secondary | ICD-10-CM

## 2023-04-12 ENCOUNTER — Other Ambulatory Visit: Payer: Self-pay

## 2023-04-12 MED ORDER — BD PEN NEEDLE MINI U/F 31G X 5 MM MISC: 4 refills | Status: AC

## 2023-04-12 NOTE — Progress Notes (Signed)
Specialty Pharmacy Refill Coordination Note  Judith [name] is a 52 y.o. female contacted today regarding refills of specialty medication(s) Mepolizumab   Patient requested Delivery   Delivery date: 04/29/23   Verified address: [address]  [place] [address]   Medication will be filled on 04/29/23 for 05/05/23 injection date.

## 2023-04-12 NOTE — Telephone Encounter (Signed)
Refill failed. resent °

## 2023-04-12 NOTE — Addendum Note (Signed)
Addended by: Julious Payer D on: 04/12/2023 10:08 AM   Modules accepted: Orders

## 2023-04-18 MED FILL — Continuous Glucose System Sensor: 28 days supply | Qty: 2 | Fill #1 | Status: CN

## 2023-04-19 ENCOUNTER — Other Ambulatory Visit: Payer: Self-pay

## 2023-04-28 ENCOUNTER — Other Ambulatory Visit: Payer: Self-pay

## 2023-04-28 ENCOUNTER — Telehealth: Payer: Self-pay

## 2023-04-28 ENCOUNTER — Other Ambulatory Visit: Payer: Self-pay | Admitting: Family Medicine

## 2023-04-28 MED ORDER — HYDROXYZINE HCL 10 MG PO TABS
10.0000 mg | ORAL_TABLET | Freq: Three times a day (TID) | ORAL | 0 refills | Status: DC | PRN
  Filled 2023-04-28: qty 30, 10d supply, fill #0

## 2023-04-28 MED FILL — Continuous Glucose System Sensor: 28 days supply | Qty: 2 | Fill #1 | Status: AC

## 2023-04-28 NOTE — Telephone Encounter (Signed)
Received notification from [facility] that pt requires a new PA.  Submitted a Prior Authorization request to [facility] for NUCALA via CoverMyMeds. Authorization has been APPROVED from 04/28/2023 to 04/27/2024. Approval letter sent to scan center.   CMM KEYDarrel Reach Authorization#: [id number]   CF has been notified, nothing further needed.

## 2023-05-05 ENCOUNTER — Other Ambulatory Visit: Payer: Self-pay | Admitting: Family Medicine

## 2023-05-05 ENCOUNTER — Other Ambulatory Visit: Payer: Self-pay

## 2023-05-05 DIAGNOSIS — G8929 Other chronic pain: Secondary | ICD-10-CM

## 2023-05-05 MED ORDER — DICLOFENAC SODIUM 1 % EX GEL
2.0000 g | Freq: Four times a day (QID) | CUTANEOUS | 99 refills | Status: DC
  Filled 2023-05-05: qty 400, 50d supply, fill #0
  Filled 2023-05-14: qty 100, 30d supply, fill #0
  Filled 2023-05-25 – 2023-08-22 (×4): qty 400, 50d supply, fill #0

## 2023-05-06 ENCOUNTER — Other Ambulatory Visit: Payer: Self-pay

## 2023-05-06 ENCOUNTER — Ambulatory Visit: Payer: Self-pay | Admitting: Family Medicine

## 2023-05-06 ENCOUNTER — Ambulatory Visit
Admission: RE | Admit: 2023-05-06 | Discharge: 2023-05-06 | Disposition: A | Discharge status: Home / Self Care | Payer: Medicare Other | Source: Ambulatory Visit | Attending: Internal Medicine | Admitting: Internal Medicine

## 2023-05-06 ENCOUNTER — Encounter (INDEPENDENT_AMBULATORY_CARE_PROVIDER_SITE_OTHER): Payer: Self-pay | Admitting: Family Medicine

## 2023-05-06 ENCOUNTER — Other Ambulatory Visit (HOSPITAL_COMMUNITY): Payer: Self-pay

## 2023-05-06 VITALS — BP 132/91 | HR 96 | Temp 98.1°F | Resp 16

## 2023-05-06 DIAGNOSIS — B029 Zoster without complications: Secondary | ICD-10-CM

## 2023-05-06 DIAGNOSIS — B372 Candidiasis of skin and nail: Secondary | ICD-10-CM

## 2023-05-06 DIAGNOSIS — R110 Nausea: Secondary | ICD-10-CM | POA: Diagnosis not present

## 2023-05-06 MED ORDER — VALACYCLOVIR HCL 1 G PO TABS: 1000.0000 mg | ORAL_TABLET | Freq: Three times a day (TID) | ORAL | 0 refills | Status: AC

## 2023-05-06 MED ORDER — ONDANSETRON 4 MG PO TBDP: 4.0000 mg | ORAL_TABLET | Freq: Three times a day (TID) | ORAL | 0 refills | Status: AC | PRN

## 2023-05-06 MED ORDER — NYSTATIN 100000 UNIT/GM EX CREA: 1.0000 | TOPICAL_CREAM | Freq: Two times a day (BID) | CUTANEOUS | 0 refills | Status: AC

## 2023-05-06 NOTE — Telephone Encounter (Signed)
Copied from CRM [id number]. Topic: Appointments - Appointment Scheduling >> May 06, 2023  1:10 PM Judith [name] wrote: Patient/patient representative is calling to schedule an appointment. Refer to attachments for appointment information. Patient requesting appointment for possible shingles.   Chief Complaint: Rash and burning sensation, pt suspects shingles Symptoms: Rash of blisters at "top inside of buttcrack" crusted over, burning sensation down "buttcheeks to mid-thigh," body aches, headaches Frequency: Continual for 1 week Pertinent Negatives: Patient denies fever, chest pain, SOB,  Disposition: [] ED /[x] Urgent Care (no appt availability in office) / [] Appointment(In office/virtual)/ []  [facility]/ [] Home Care/ [] Refused Recommended Disposition /[] [facility]/ []  Follow-up with PCP Additional Notes: Pt reporting that she has had rash and burning sensation that "started about a week ago." Pt reporting that she has a rash that "looks like a cluster," "couple of red patches, blisters in it," pt reporting she "initially thought rubbed [raw] from sweating, put powder on it," and while tending to the area found the rash to be "raised." Pt reporting that she "thought maybe yeast because diabetic" then had fiance examine the area, identified that "it's a cluster of things." Pt reporting that the area "was wet then" from the blisters, but pt "put nystatin on it overnight, next day it's dried up but still hurts, it's sore," blisters are "dried up, not wet like it was, crusted dry but not gone away though." Pt reporting that she started experiencing "burning around same time rash," expressing that her "mid thigh been on fire and raw" feeling with "burning feeling" but pt confirms no redness to thighs. Pt clarifying that the rash is at "top inside of buttcrack" and "from there down in butt, buttcheeks, have burning and pressure feeling, never had before." Pt reporting that the "burning goes  all way down into buttcheek" confirms not to anus, "like buttcheeks are on fire," also feeling burning feeling to "front and back of legs, not all the way to knee, and sides of thigh." Pt reporting it is a "tingling burning" feeling. Pt reporting that she takes gabapentin and is wondering if should take an additional dose. Advised pt to be examined then determine from there. Pt reporting family member had shingles and confirmed possibility of it for pt with rash and burning feeling. Pt reporting she "took picture on google lens, came up as shingles, makes sense with burning in legs." Pt denies fever or chills, confirms having some body aches and "some nausea but nausea not uncommon for me, some stomach issues, have had to take zofran" this week. Pt reporting "body aches like when have flu," but only from "waist down," reporting that it "hurts to walk with aching all over from waist down and the rubbing at that spot, feel like I've walked for 2 miles after not walking for while." Pt also reporting that "around the time this started, had real bad headache, vision was blurry, off-balance, for 3 days wouldn'[name] leave the house, head was hurting but just didn'[name] feel right, thought migraine," but pt reporting that with her hx of migraines "usually just the nausea, but this time different but just assumed it was migraine, just felt off." Pt reporting she has "nerve stuff going on in neck, doing PT, so have had headaches but can'[name] say whether one of my normal headaches or not." Pt reporting she "about fell a couple different times because dizzy and had to stop and catch myself, head was spinning around," causing her to need to go home from work.  Pt confirms the headache and dizziness went away after few days. Pt reporting that she feels "just very uncomfortable." Pt confirms she's had shingles vax and had her booster last yeat. Pt reporting she "had chickenpox when I was kid," she is unsure if anyone recently had  chickenpox/shingles around her. Pt confirms no chest pain or SOB, reporting her asthma is well-controlled. Advised pt be examined today for her symptoms, no availability at PCP office, offered availability early tomorrow at another office, pt preferring to go to UC. Scheduled 2:30 pm appt for pt at [facility] in [place] per pt request.  Reason for Disposition  [1] Shingles rash AND [2] onset > 72 hours ago (3 days)  Answer Assessment - Initial Assessment Questions 1. APPEARANCE of RASH: "Describe the rash."      Pt reporting that she has a rash that "looks like a cluster," "couple of red patches, blisters in it," pt reporting she "initially thought rubbed [raw] from sweating, put powder on it," and while tending to the area found the rash to be "raised." Pt reporting that she "thought maybe yeast because diabetic" then had fiance examine the area, identified that "it's a cluster of things." Pt reporting that the area "was wet then" from the blisters, but pt "put nystatin on it overnight, next day it's dried up but still hurts, it's sore," blisters are "dried up, not wet like it was, crusted dry but not gone away though."  2. LOCATION: "Where is the rash located?"      Pt clarifying that the rash is at "top inside of buttcrack" 3. ONSET: "When did the rash start?"      Pt reporting "about a week ago." 5. PAIN: "Does the rash hurt?" If Yes, ask: "How bad is the pain?"  (Scale 0-10; or none, mild, moderate, severe)    - NONE (0): no pain    - MILD (1-3): doesn'[name] interfere with normal activities     - MODERATE (4-7): interferes with normal activities or awakens from sleep     - SEVERE (8-10): excruciating pain, unable to do any normal activities     Pt reporting the blisters are sore and emphasizing they hurt. Pt reporting it is a "tingling burning" feeling. Pt reporting that she takes gabapentin and is wondering if should take an additional dose. Pt reporting that she feels "just  very uncomfortable."  6. OTHER SYMPTOMS: "Do you have any other symptoms?" (e.g., fever)     Pt denies fever or chills, confirms having some body aches and "some nausea but nausea not uncommon for me, some stomach issues, have had to take zofran" this week. Pt reporting "body aches like when have flu," but only from "waist down," reporting that it "hurts to walk with aching all over from waist down and the rubbing at that spot, feel like I've walked for 2 miles after not walking for while." Pt also reporting that "around the time this started, had real bad headache, vision was blurry, off-balance, for 3 days wouldn'[name] leave the house, head was hurting but just didn'[name] feel right, thought migraine," but pt reporting that with her hx of migraines "usually just the nausea, but this time different but just assumed it was migraine, just felt off." Pt reporting she has "nerve stuff going on in neck, doing PT, so have had headaches but can'[name] say whether one of my normal headaches or not." Pt reporting she "about fell a couple different times because dizzy and had  to stop and catch myself, head was spinning around," causing her to need to go home from work. Pt confirms the headache and dizziness went away after few days.  Protocols used: Shingles (Zoster)-A-AH

## 2023-05-06 NOTE — Discharge Instructions (Signed)
Your rash appears consistent with shingles. I have provided you with some information about the virus and prescribed you the antiviral treatment.  Avoid those who are pregnant and unvaccinated.  You should follow-up with your PCP to discuss adjusting your gabapentin based on the symptoms you are having associated with the shingles.   I have sent a refill for your zofran and nystatin as well.   It is very important for you to pay attention to any new symptoms or worsening of your current condition. Please go directly to the Emergency Department immediately should you begin to feel worse in any way or have any of the following symptoms: persistent fevers, increased pain, increased swelling, increased redness, redness outside the demarcation or streaking down your leg.

## 2023-05-06 NOTE — ED Provider Notes (Signed)
[facility]  Note:  This document was prepared using Dragon voice recognition software and may include unintentional dictation errors.  MRN: [id number] DOB: [birth year] DATE: 05/06/23   Subjective:  Chief Complaint:  Chief Complaint  Patient presents with   Blister    Potential shingles infection, burning sensation, cluster of blisters - Entered by patient     HPI: Judith [name] is a 52 y.o. female presenting for rash for one week. Patient states she has a history of chronic back pain and thought she was having her usual chronic pain. She applied a pain patch to the area of her low back at the level of her gluteal cleft. She states she soon noticed a rash in the area with blisters and thought that she may have caused irritation to the area from the patch. However, the rash started having drainage and she began having a burning sensation down her leg. She goggled her symptoms and is concerned that she may have shingles at this time.  She reports history of chickenpox.  She also reports trying nystatin on the rash with no improvement.  She does report getting the Shingrix vaccine.  No one at home with similar symptoms.  Denies fever, vomiting, abdominal pain. Endorses rash, discharge, burning, nausea. Presents NAD.  Prior to Admission medications   Medication Sig Start Date End Date Taking? Authorizing Provider  Accu-Chek FastClix Lancets MISC Test blood sugars daily Dx E11.9 11/27/21   Delynn Flavin M, DO  ACCU-CHEK GUIDE test strip TEST BLOOD SUGARS DAILY DX E11.9 02/18/23   Delynn Flavin M, DO  amphetamine-dextroamphetamine (ADDERALL XR) 20 MG 24 hr capsule Take 2 capsules (40 mg total) by mouth daily. 03/08/23   Raliegh Ip, DO  amphetamine-dextroamphetamine (ADDERALL XR) 20 MG 24 hr capsule Take 2 capsules (40 mg total) by mouth daily. 04/07/23   Raliegh Ip, DO  amphetamine-dextroamphetamine (ADDERALL XR) 20 MG 24 hr capsule Take 2 capsules (40 mg total) by  mouth in the morning. 05/07/23   Raliegh Ip, DO  azelastine (ASTELIN) 0.1 % nasal spray Place 1 spray into both nostrils 2 (two) times daily. 11/02/22   Raliegh Ip, DO  Blood Glucose Monitoring Suppl (ACCU-CHEK GUIDE ME) w/Device KIT Use to check BS BID and as needed. DX: E11.9 01/26/22   Raliegh Ip, DO  cetirizine (ZYRTEC) 10 MG tablet Take 1 tablet (10 mg total) by mouth daily as needed for allergies. 12/22/22   Raliegh Ip, DO  Continuous Glucose Sensor (FREESTYLE LIBRE 3 SENSOR) MISC Use to check blood glucose continously, change sensor every 14 days 03/29/23   Delynn Flavin M, DO  cyclobenzaprine (FLEXERIL) 10 MG tablet Take 1 tablet 3 times a day by oral route as needed. 07/10/20   [provider]  dexlansoprazole (DEXILANT) 60 MG capsule Take 1 capsule (60 mg total) by mouth daily. 11/17/22   Armbruster, Willaim Rayas, MD  diclofenac Sodium (VOLTAREN) 1 % GEL Apply 2 g topically 4 (four) times daily. 05/05/23   Raliegh Ip, DO  dicyclomine (BENTYL) 20 MG tablet Take 1 tablet (20 mg) by mouth every 6 hours as needed for spasms (abdominal cramps). 12/11/22   Raliegh Ip, DO  EPINEPHrine 0.3 mg/0.3 mL IJ SOAJ injection Inject 0.3 mg into the muscle as needed for anaphylaxis. 11/25/22   Hunsucker, Lesia Sago, MD  FLUoxetine (PROZAC) 40 MG capsule Take 40 mg by mouth daily.    [provider]  gabapentin (NEURONTIN) 300 MG capsule  TAKE 1 CAPSULE BY MOUTH THREE TIMES A DAY 11/02/22   Delynn Flavin M, DO  hydrOXYzine (ATARAX) 10 MG tablet Take 1 tablet (10 mg total) by mouth 3 (three) times daily as needed. 04/28/23   Delynn Flavin M, DO  insulin degludec (TRESIBA FLEXTOUCH) 200 UNIT/ML FlexTouch Pen INJECT 50-60 UNITS INTO THE SKIN AT BEDTIME AS NEEDED AS DIRECTED BY PCP 02/09/23   Delynn Flavin M, DO  Insulin Pen Needle (B-D UF III MINI PEN NEEDLES) 31G X 5 MM MISC use as directed once daily Dx E11.8 04/12/23   Raliegh Ip,  DO  levothyroxine (SYNTHROID) 125 MCG tablet Take 1 tablet (125 mcg total) by mouth daily. 11/02/22   Raliegh Ip, DO  linaclotide (LINZESS) 72 MCG capsule Take 1 capsule (72 mcg total) by mouth daily before breakfast. 02/10/22   Delynn Flavin M, DO  medroxyPROGESTERone (DEPO-PROVERA) 150 MG/ML injection INJECT 1 ML (150 MG TOTAL) INTO THE MUSCLE EVERY 3 (THREE) MONTHS 11/02/22   Raliegh Ip, DO  Melatonin 2.5 MG CHEW Chew by mouth.    [provider]  Mepolizumab (NUCALA) 100 MG/ML SOAJ Inject 1 mL (100 mg total) into the skin every 28 (twenty-eight) days. 12/25/22   Hunsucker, Lesia Sago, MD  montelukast (SINGULAIR) 10 MG tablet TAKE 1 TABLET BY MOUTH EVERYDAY AT BEDTIME 10/14/22   Delynn Flavin M, DO  nystatin cream (MYCOSTATIN) Apply 1 Application topically 2 (two) times daily. x2 weeks 12/26/21   Delynn Flavin M, DO  ondansetron (ZOFRAN-ODT) 4 MG disintegrating tablet Take 1 tablet (4 mg total) by mouth every 8 (eight) hours as needed for nausea or vomiting. 02/09/23   Theadora Rama Scales, PA-C  pioglitazone (ACTOS) 45 MG tablet Take 1 tablet (45 mg total) by mouth daily. 11/02/22   Raliegh Ip, DO  Rimegepant Sulfate (NURTEC) 75 MG TBDP Dissolve 1 tablet in mouth at onset of migraine headache. MAX 1 tablet per 24 hours. 11/10/22   Raliegh Ip, DO  rosuvastatin (CRESTOR) 40 MG tablet Take 1 tablet (40 mg total) by mouth at bedtime. 11/02/22   Raliegh Ip, DO  tirzepatide [facility]) 15 MG/0.5ML Pen Inject 15 mg into the skin once a week. 10/13/22        Allergies  Allergen Reactions   Diphenhydramine Other (See Comments)    Hair feels like it is crawling   Keflex [Cephalexin] Hives   Morphine Nausea And Vomiting    PCA PUMP/ DRIP -- N/V IV PUSH IN ER NO PROBLEM PER PT.     Prednisone Nausea And Vomiting   Adhesive [Tape]     Paper tape causes irritation    Erythromycin Hives   Latex    Metformin And Related     Diarrhea with IR and  XR products   Sulfonamide Derivatives Other (See Comments)    Does not take due to family history    History:   Past Medical History:  Diagnosis Date   ADD (attention deficit disorder) without hyperactivity    Allergy    Anxiety    Arthritis    Asthma    Bipolar 1 disorder (HCC)    Blood transfusion without reported diagnosis    with child birth 16 yrs ago    Bronchitis, chronic (HCC)    Constipation    COVID-19 virus infection 06/21/2020   DDD (degenerative disc disease), cervical    Depression    Diabetes mellitus without complication (HCC)    Dysphagia    Family history  of adverse reaction to anesthesia    mother had a hard time waking up after knee surgery in 2021   Gastroparesis    GERD (gastroesophageal reflux disease)    History of kidney stones    History of MRSA infection    Hyperlipemia    Hypothyroid    Migraines    Pneumonia    Sleep apnea    no cpap      Past Surgical History:  Procedure Laterality Date   ABDOMINAL SURGERY     ANTERIOR CERVICAL DECOMP/DISCECTOMY FUSION N/A 10/09/2020   Procedure: Anterior Cervical Discectomy Fusion Cervical five-six;  Surgeon: Bedelia Person, MD;  Location: [facility];  Service: Neurosurgery;  Laterality: N/A;   BIOPSY  02/04/2021   Procedure: BIOPSY;  Surgeon: Benancio Deeds, MD;  Location: [facility];  Service: Gastroenterology;;   BOTOX INJECTION N/A 02/04/2021   Procedure: BOTOX INJECTION;  Surgeon: Benancio Deeds, MD;  Location: [facility];  Service: Gastroenterology;  Laterality: N/A;   CARPAL TUNNEL RELEASE Bilateral 1999   CARPOMETACARPEL SUSPENSION PLASTY Left 08/05/2022   Procedure: Left thumb trapeziectomy and suspensionplasty;  Surgeon: Marlyne Beards, MD;  Location:  SURGERY CENTER;  Service: Orthopedics;  Laterality: Left;  with regional block  120   CESAREAN SECTION     CHOLECYSTECTOMY     COLONOSCOPY     ESOPHAGEAL MANOMETRY N/A 07/08/2020   Procedure: ESOPHAGEAL MANOMETRY  (EM);  Surgeon: Benancio Deeds, MD;  Location: [facility];  Service: Gastroenterology;  Laterality: N/A;   ESOPHAGOGASTRODUODENOSCOPY (EGD) WITH PROPOFOL N/A 02/04/2021   Procedure: ESOPHAGOGASTRODUODENOSCOPY (EGD) WITH PROPOFOL;  Surgeon: Benancio Deeds, MD;  Location: [facility];  Service: Gastroenterology;  Laterality: N/A;   REPLACEMENT TOTAL KNEE Bilateral    SCLEROTHERAPY  02/04/2021   Procedure: SCLEROTHERAPY;  Surgeon: Benancio Deeds, MD;  Location: [facility];  Service: Gastroenterology;;   THYROIDECTOMY     TONSILLECTOMY     TONSILLECTOMY AND ADENOIDECTOMY     TRIGGER FINGER RELEASE Left 06/16/2021   Procedure: LEFT THUMB TRIGGER FINGER RELEASE;  Surgeon: Oliver Barre, MD;  Location: [facility];  Service: Orthopedics;  Laterality: Left;   TRIGGER FINGER RELEASE Right 09/29/2021   Procedure: RELEASE TRIGGER FINGER/A-1 PULLEY;  Surgeon: Oliver Barre, MD;  Location: [facility];  Service: Orthopedics;  Laterality: Right;  Right ring, index and thumb trigger finger   UPPER GASTROINTESTINAL ENDOSCOPY      Family History  Problem Relation Age of Onset   Asthma Mother    Bronchiolitis Mother    Atrial fibrillation Mother    GER disease Mother    Osteoporosis Mother    Osteoarthritis Mother    Dementia Mother        mild , was medically induced   Transient ischemic attack Mother        x 2   Aneurysm Mother        thoriatic   Bipolar disorder Mother    Depression Mother    Hyperlipidemia Mother    Non-Hodgkin's lymphoma Father    Hyperlipidemia Father    Breast cancer Paternal Grandmother    Lung cancer Paternal Grandmother    Mental illness Other    Diabetes Other    Esophageal cancer Other    Colon cancer Neg Hx    Stomach cancer Neg Hx    Pancreatic cancer Neg Hx    Colon polyps Neg Hx     Social History   Tobacco Use   Smoking status: Former  Current packs/day: 0.00    Average packs/day: 1 pack/day for 5.0 years (5.0 ttl pk-yrs)    Types:  Cigarettes    Start date: 04/04/1988    Quit date: 04/04/1993    Years since quitting: 30.1   Smokeless tobacco: Never  Vaping Use   Vaping status: Every Day   Substances: CBD  Substance Use Topics   Alcohol use: No   Drug use: Yes    Types: Marijuana    Review of Systems  Constitutional:  Negative for fever.  Gastrointestinal:  Positive for nausea. Negative for abdominal pain and vomiting.  Skin:  Positive for rash.     Objective:   Vitals: BP (!) 132/91 (BP Location: Right Arm)   Pulse 96   Temp 98.1 F (36.7 C) (Oral)   Resp 16   SpO2 98%   Physical Exam Constitutional:      General: She is not in acute distress.    Appearance: Normal appearance. She is well-developed. She is morbidly obese. She is not ill-appearing or toxic-appearing.  HENT:     Head: Normocephalic and atraumatic.  Cardiovascular:     Rate and Rhythm: Normal rate and regular rhythm.     Heart sounds: Normal heart sounds.  Pulmonary:     Effort: Pulmonary effort is normal.     Breath sounds: Normal breath sounds.     Comments: Clear to auscultation bilaterally  Abdominal:     General: Bowel sounds are normal.     Palpations: Abdomen is soft.     Tenderness: There is no abdominal tenderness.  Skin:    General: Skin is warm and dry.     Findings: Rash present. Rash is crusting and vesicular.     Comments: Patient has rash on left upper buttocks and gluteal cleft.  Appears consistent with herpes zoster with vesicular lesions and crusting currently.  No warmth, erythema, discharge.  Neurological:     General: No focal deficit present.     Mental Status: She is alert.  Psychiatric:        Mood and Affect: Mood and affect normal.     Results:  Labs: No results found. However, due to the size of the patient record, not all encounters were searched. Please check Results Review for a complete set of results.  Radiology: No results found.   UC Course/Treatments:  Procedures: Procedures    Medications Ordered in UC: Medications - No data to display   Assessment and Plan :     ICD-10-CM   1. Herpes zoster without complication  B02.9     2. Nausea without vomiting  R11.0     3. Candidiasis of skin  B37.2      Herpes zoster without complication Afebrile, nontoxic-appearing, NAD. VSS. DDX includes but not limited to: Herpes zoster, contact dermatitis, atopic dermatitis, psoriasis, HSV Vesicular lesions with crusting.  Appears consistent with healing shingles rash.  No discharge noted on exam.  Given that lesions are crusting and no longer vesicular, I do believe the patient is at the end of the healing process.  However, given that she continues to have neuropathic pain and burning, will prescribe Valtrex 1 g every 8 hours.  She is currently on Gabapentin and recommend she contact her PCP to discuss adjustment given neuropathy symptoms. Strict ED precautions were given and patient verbalized understanding.  Nausea without vomiting Afebrile, nontoxic-appearing, NAD. VSS. Zofran previously prescribed.  Patient reports some nausea and requesting refill of Zofran.  Zofran ODT 4 mg  every 8 hours as needed was refilled. Strict ED precautions were given and patient verbalized understanding.  Candidiasis of skin Afebrile, nontoxic-appearing, NAD. VSS. Nystatin cream previously prescribed.  Patient is diabetic and reports that she has a prescription for nystatin cream that she uses as needed for yeast.  She tried applying the cream to her current rash with no improvement. She is requesting a refill nystatin 1 application twice daily was prescribed. Strict ED precautions were given and patient verbalized understanding.   ED Discharge Orders          Ordered    ondansetron (ZOFRAN-ODT) 4 MG disintegrating tablet  Every 8 hours PRN        05/06/23 1501    nystatin cream (MYCOSTATIN)  2 times daily        05/06/23 1501    valACYclovir (VALTREX) 1000 MG tablet  Every 8 hours         05/06/23 1502             I have reviewed the PDMP during this encounter.     Cynda Acres, PA-C 05/06/23 1518

## 2023-05-06 NOTE — ED Triage Notes (Signed)
Triaged by provider  

## 2023-05-07 ENCOUNTER — Other Ambulatory Visit: Payer: Self-pay

## 2023-05-07 MED ORDER — GABAPENTIN 600 MG PO TABS: 600.0000 mg | ORAL_TABLET | Freq: Three times a day (TID) | ORAL | 0 refills | Status: DC

## 2023-05-07 NOTE — Telephone Encounter (Signed)
Please see the MyChart message reply(ies) for my assessment and plan.    This patient gave consent for this Medical Advice Message and is aware that it may result in a bill to their insurance company, as well as the possibility of receiving a bill for a co-payment or deductible. They are an established patient, but are not seeking medical advice exclusively about a problem treated during an in person or video visit in the last seven days. I did not recommend an in person or video visit within seven days of my reply.    I spent a total of 5 minutes cumulative time within 7 days through MyChart messaging.  Breyana Follansbee, DO   

## 2023-05-11 ENCOUNTER — Other Ambulatory Visit: Payer: Self-pay

## 2023-05-13 ENCOUNTER — Other Ambulatory Visit: Payer: Self-pay

## 2023-05-14 ENCOUNTER — Other Ambulatory Visit (HOSPITAL_COMMUNITY): Payer: Self-pay

## 2023-05-14 ENCOUNTER — Other Ambulatory Visit (HOSPITAL_BASED_OUTPATIENT_CLINIC_OR_DEPARTMENT_OTHER): Payer: Self-pay

## 2023-05-20 ENCOUNTER — Ambulatory Visit: Payer: Medicare Other

## 2023-05-21 ENCOUNTER — Telehealth: Payer: Self-pay | Admitting: Family Medicine

## 2023-05-21 ENCOUNTER — Other Ambulatory Visit (HOSPITAL_COMMUNITY): Payer: Self-pay

## 2023-05-24 ENCOUNTER — Other Ambulatory Visit (HOSPITAL_COMMUNITY): Payer: Self-pay

## 2023-05-24 ENCOUNTER — Telehealth: Payer: Self-pay

## 2023-05-24 ENCOUNTER — Ambulatory Visit: Payer: Medicare Other

## 2023-05-24 NOTE — Telephone Encounter (Signed)
 PA request has been Submitted. New Encounter created for follow up. For additional info see Pharmacy Prior Auth telephone encounter from 01/06.

## 2023-05-24 NOTE — Telephone Encounter (Signed)
*  Primary  Pharmacy Patient Advocate Encounter   Received notification from Pt Calls Messages that prior authorization for Mounjaro  15mg  is required/requested.   Insurance verification completed.   The patient is insured through [facility] MEDICAID .   Per test claim: PA required; PA submitted to above mentioned insurance via [facility] Tracks Key/confirmation #/EOC [id number] W Status is pending

## 2023-05-25 ENCOUNTER — Other Ambulatory Visit (HOSPITAL_COMMUNITY): Payer: Self-pay | Admitting: Pharmacy Technician

## 2023-05-25 ENCOUNTER — Other Ambulatory Visit: Payer: Self-pay

## 2023-05-25 ENCOUNTER — Other Ambulatory Visit: Payer: Self-pay | Admitting: Family Medicine

## 2023-05-25 ENCOUNTER — Other Ambulatory Visit (HOSPITAL_COMMUNITY): Payer: Self-pay

## 2023-05-25 DIAGNOSIS — G43009 Migraine without aura, not intractable, without status migrainosus: Secondary | ICD-10-CM

## 2023-05-25 MED FILL — Continuous Glucose System Sensor: 28 days supply | Qty: 2 | Fill #2 | Status: CN

## 2023-05-25 NOTE — Progress Notes (Signed)
 Specialty Pharmacy Refill Coordination Note  Judith [name] is a 53 y.o. female contacted today regarding refills of specialty medication(s) Mepolizumab  (Nucala )   Patient requested Delivery   Delivery date: 06/01/23   Verified address: Patient address [address]  [place]   Medication will be filled on 05/31/23.

## 2023-05-25 NOTE — Telephone Encounter (Signed)
 Name from pharmacy: NURTEC ODT 75 MG TABLET  Pharmacy comment: Alternative Requested:NEW [employer] - NON-FORMULARY.

## 2023-05-25 NOTE — Telephone Encounter (Signed)
 Please inform pt that new insurance does not cover. If she will call them and see what the do cover, I'm glad to change her to an alternative.

## 2023-05-25 NOTE — Telephone Encounter (Signed)
Please see my previous response

## 2023-05-26 ENCOUNTER — Telehealth: Payer: Self-pay

## 2023-05-26 ENCOUNTER — Other Ambulatory Visit (HOSPITAL_COMMUNITY): Payer: Self-pay

## 2023-05-26 ENCOUNTER — Telehealth: Payer: Self-pay | Admitting: Family Medicine

## 2023-05-26 NOTE — Telephone Encounter (Signed)
 Pharmacy Patient Advocate Encounter   Received notification from Pt Calls Messages that prior authorization for Mounjaro  15MG /0.5ML auto-injectors is required/requested.   Insurance verification completed.   The patient is insured through [facility] .   Per test claim: PA required; PA submitted to above mentioned insurance via CoverMyMeds Key/confirmation #/EOC [facility] Status is pending

## 2023-05-26 NOTE — Telephone Encounter (Signed)
 PA request has been Submitted. New Encounter created for follow up. For additional info see Pharmacy Prior Auth telephone encounter from 05/26/23.

## 2023-05-26 NOTE — Telephone Encounter (Signed)
 Pharmacy Patient Advocate Encounter  Received notification from [place] [employer] that Prior Authorization for MOUNJARO  15mg  has been DENIED.  Full denial letter will be uploaded to the media tab. See denial reason below.   PA #/Case ID/Reference #: Based on the information submitted by the provider, the beneficiary does not meet the required clinical criteria for the requested medication. In addition to therapeutic failure or inadequate response to metformin, the beneficiary is required to have tried and failed two preferred medications from the same class, notwithstanding clinical justification. The beneficiary must have a diagnosis of Type 2 Diabetes. Byetta Pen, Trulicity  Pen, Victoza Pen, and Ozempic  Pen are preferred.

## 2023-05-27 ENCOUNTER — Other Ambulatory Visit (HOSPITAL_BASED_OUTPATIENT_CLINIC_OR_DEPARTMENT_OTHER): Payer: Self-pay

## 2023-05-27 ENCOUNTER — Other Ambulatory Visit (HOSPITAL_COMMUNITY): Payer: Self-pay

## 2023-05-27 ENCOUNTER — Telehealth: Payer: Self-pay | Admitting: Pharmacist

## 2023-05-27 NOTE — Telephone Encounter (Signed)
 Patient states she has two insurances so Greggory Keen will have to be approved by both in order to process.  Prior Berkley Harvey is already pending per CPhT documentation yesterday

## 2023-05-28 ENCOUNTER — Other Ambulatory Visit (HOSPITAL_COMMUNITY): Payer: Self-pay

## 2023-05-28 ENCOUNTER — Telehealth: Payer: Self-pay | Admitting: Family Medicine

## 2023-05-28 ENCOUNTER — Other Ambulatory Visit: Payer: Self-pay

## 2023-05-28 ENCOUNTER — Ambulatory Visit: Payer: Self-pay | Admitting: Family Medicine

## 2023-05-28 ENCOUNTER — Other Ambulatory Visit: Payer: Self-pay | Admitting: Family Medicine

## 2023-05-28 DIAGNOSIS — E118 Type 2 diabetes mellitus with unspecified complications: Secondary | ICD-10-CM

## 2023-05-28 MED ORDER — INSULIN DEGLUDEC 100 UNIT/ML ~~LOC~~ SOPN: 50.0000 [IU] | PEN_INJECTOR | Freq: Every day | SUBCUTANEOUS | 1 refills | Status: DC

## 2023-05-28 MED ORDER — TRESIBA FLEXTOUCH 200 UNIT/ML ~~LOC~~ SOPN: 50.0000 [IU] | PEN_INJECTOR | Freq: Every evening | SUBCUTANEOUS | 0 refills | Status: DC | PRN

## 2023-05-28 NOTE — Telephone Encounter (Signed)
 Copied from CRM [id number]. Topic: Clinical - Medication Refill >> May 28, 2023  4:35 PM Zebedee SAUNDERS wrote: Most Recent Primary Care Visit:  Provider: JOLINDA NORENE HERO  Department: [facility]  Visit Type: MYCHART VIDEO VISIT  Date: 03/08/2023  Medication: insulin  degludec (TRESIBA  FLEXTOUCH) 200 UNIT/ML FlexTouch Pen  Has the patient contacted their pharmacy? Yes (Agent: If no, request that the patient contact the pharmacy for the refill. If patient does not wish to contact the pharmacy document the reason why and proceed with request.) (Agent: If yes, when and what did the pharmacy advise?)  Is this the correct pharmacy for this prescription? Yes If no, delete pharmacy and type the correct one.  This is the patient's preferred pharmacy:  [id number] [place] [place] - [address] [place] [address] Phone: [phone] Fax: [phone]  [id number] [place] [place] - [address] [place] [address] Phone: [phone] Fax: [phone]    Has the prescription been filled recently?   Is the patient out of the medication?   Has the patient been seen for an appointment in the last year OR does the patient have an upcoming appointment?   Can we respond through MyChart?   Agent: Please be advised that Rx refills may take up to 3 business days. We ask that you follow-up with your pharmacy.

## 2023-05-28 NOTE — Telephone Encounter (Signed)
 Pharmacy Patient Advocate Encounter  Received notification from [facility] that Prior Authorization for Mounjaro  15MG /0.5ML auto-injectors  has been DENIED.  See denial reason below. No denial letter attached in CMM. Will attach denial letter to Media tab once received.   PA #/Case ID/Reference #: [id number]   DENIAL REASON: Must try and fail preferred products. Preferred products: Trulicity , Victoza.

## 2023-05-28 NOTE — Telephone Encounter (Signed)
 Please see Judith [name]'s very thorough note from June 2024 which documented intolerances to Ozempic/ Rybelsus/ Trulicity. She has tried all of these meds already. Hence, the rx for [facility].

## 2023-05-28 NOTE — Telephone Encounter (Signed)
 Looks like patient will need to try both preferred drugs before coverage. Consulted pharmacist. She could try an appeal based on her intolerance to Ozempic  and Trulicity  since Victoza is in the same drug class. Please advise if appeal is requested or if Victoza will be tried first.

## 2023-05-28 NOTE — Telephone Encounter (Signed)
Can you please assist with this one?

## 2023-05-28 NOTE — Telephone Encounter (Signed)
 Pt called and spoke to this RN stating that she has called 3 times today to the office advising she would not have enough of her Tresiba  insulin  to get through the night. Pt reports she was advised a message would be forwarded to the provider. Pt reports she had not heard anything and the pharmacy was closing at 5. This RN spoke to Dr. Zollie, on call provider who gave verbal orders to refill pt's Tresiba  script to preferred pharmacy. Pharmacy advised no 200 mL pens available, script adjusted appropriately and sent to pharmacy per on call provider. This RN was on phone with pt when pharmacy staff was heard telling the pt We've been here since 5, I don't know why you waited all day, you're just going to have to come back. Pt states let's hope I don't die tonight. Pt communicates concern about her BG readings as she had a surgery yesterday and is currently on steroids which elevate her BG levels considerably. This RN advised pt that if she is able to pick up script in the morning it has been sent. This RN educated pt on home care, new-worsening symptoms, when to call back/seek emergent care. Pt verbalized understanding and agrees to plan.  Copied from CRM [id number]. Topic: Clinical - Red Word Triage >> May 28, 2023  4:47 PM Graeme ORN wrote: Red Word that prompted transfer to Nurse Triage: Patient called out of diabetic medication. Office closed. Patient at pharmacy. Scared to go without due to weather. Reason for Disposition  [1] Prescription refill request for ESSENTIAL medicine (i.e., likelihood of harm to patient if not taken) AND [2] triager unable to refill per department policy  Answer Assessment - Initial Assessment Questions 1. DRUG NAME: What medicine do you need to have refilled?     Tresiba  2. REFILLS REMAINING: How many refills are remaining? (Note: The label on the medicine or pill bottle will show how many refills are remaining. If there are no refills remaining, then a renewal may be  needed.)     None  4. PRESCRIBING HCP: Who prescribed it? Reason: If prescribed by specialist, call should be referred to that group.     Norene Fielding, DO 5. SYMPTOMS: Do you have any symptoms?     Elevated BG readings due to recent steroid course after a procedure  Protocols used: Medication Refill and Renewal Call-A-AH

## 2023-05-28 NOTE — Telephone Encounter (Signed)
 Copied from CRM [id number]. Topic: Clinical - Medication Refill >> May 28, 2023  3:39 PM Carlatta H wrote: Most Recent Primary Care Visit:  Provider: JOLINDA NORENE HERO  Department: [facility]  Visit Type: MYCHART VIDEO VISIT  Date: 03/08/2023  Medication: insulin  degludec (TRESIBA  FLEXTOUCH) 200 UNIT/ML FlexTouch Pen [[id number]]  dexlansoprazole  (DEXILANT ) 60 MG capsule [[id number]]  Has the patient contacted their pharmacy? Yes (Agent: If no, request that the patient contact the pharmacy for the refill. If patient does not wish to contact the pharmacy document the reason why and proceed with request.) (Agent: If yes, when and what did the pharmacy advise?)  Is this the correct pharmacy for this prescription? Yes If no, delete pharmacy and type the correct one.  This is the patient's preferred pharmacy:  [id number] [place], [place] - [address] [place] [address] Phone: [phone] Fax: [phone]   Has the prescription been filled recently? No  Is the patient out of the medication? Yes  Has the patient been seen for an appointment in the last year OR does the patient have an upcoming appointment? Yes  Can we respond through MyChart? Yes  Agent: Please be advised that Rx refills may take up to 3 business days. We ask that you follow-up with your pharmacy.

## 2023-05-30 ENCOUNTER — Other Ambulatory Visit: Payer: Self-pay | Admitting: Family Medicine

## 2023-05-30 DIAGNOSIS — J4540 Moderate persistent asthma, uncomplicated: Secondary | ICD-10-CM

## 2023-05-31 ENCOUNTER — Other Ambulatory Visit: Payer: Self-pay | Admitting: Family Medicine

## 2023-05-31 ENCOUNTER — Other Ambulatory Visit (HOSPITAL_COMMUNITY): Payer: Self-pay

## 2023-05-31 ENCOUNTER — Other Ambulatory Visit: Payer: Self-pay

## 2023-05-31 MED ORDER — HYDROXYZINE HCL 10 MG PO TABS
10.0000 mg | ORAL_TABLET | Freq: Three times a day (TID) | ORAL | 0 refills | Status: DC | PRN
  Filled 2023-05-31: qty 30, 10d supply, fill #0

## 2023-05-31 MED FILL — Continuous Glucose System Sensor: 28 days supply | Qty: 2 | Fill #2 | Status: CN

## 2023-06-01 ENCOUNTER — Other Ambulatory Visit (HOSPITAL_COMMUNITY): Payer: Self-pay

## 2023-06-01 NOTE — Telephone Encounter (Signed)
 Patient has tried and failed trulicity, ozempic,

## 2023-06-02 ENCOUNTER — Ambulatory Visit: Payer: Medicare Other | Admitting: Family Medicine

## 2023-06-02 ENCOUNTER — Encounter: Payer: Self-pay | Admitting: Family Medicine

## 2023-06-02 ENCOUNTER — Other Ambulatory Visit (HOSPITAL_COMMUNITY)
Admission: RE | Admit: 2023-06-02 | Discharge: 2023-06-02 | Disposition: A | Discharge status: Home / Self Care | Payer: Medicare Other | Source: Ambulatory Visit | Attending: Family Medicine | Admitting: Family Medicine

## 2023-06-02 VITALS — BP 127/83 | HR 110 | Temp 98.7°F | Ht 66.0 in | Wt 262.0 lb

## 2023-06-02 DIAGNOSIS — Z01419 Encounter for gynecological examination (general) (routine) without abnormal findings: Secondary | ICD-10-CM | POA: Diagnosis present

## 2023-06-02 DIAGNOSIS — E1159 Type 2 diabetes mellitus with other circulatory complications: Secondary | ICD-10-CM | POA: Diagnosis not present

## 2023-06-02 DIAGNOSIS — I152 Hypertension secondary to endocrine disorders: Secondary | ICD-10-CM

## 2023-06-02 DIAGNOSIS — E1169 Type 2 diabetes mellitus with other specified complication: Secondary | ICD-10-CM

## 2023-06-02 DIAGNOSIS — E118 Type 2 diabetes mellitus with unspecified complications: Secondary | ICD-10-CM | POA: Diagnosis not present

## 2023-06-02 DIAGNOSIS — Z01411 Encounter for gynecological examination (general) (routine) with abnormal findings: Secondary | ICD-10-CM

## 2023-06-02 DIAGNOSIS — Z124 Encounter for screening for malignant neoplasm of cervix: Secondary | ICD-10-CM

## 2023-06-02 DIAGNOSIS — G43009 Migraine without aura, not intractable, without status migrainosus: Secondary | ICD-10-CM

## 2023-06-02 DIAGNOSIS — Z3042 Encounter for surveillance of injectable contraceptive: Secondary | ICD-10-CM | POA: Diagnosis not present

## 2023-06-02 DIAGNOSIS — F988 Other specified behavioral and emotional disorders with onset usually occurring in childhood and adolescence: Secondary | ICD-10-CM

## 2023-06-02 DIAGNOSIS — E039 Hypothyroidism, unspecified: Secondary | ICD-10-CM

## 2023-06-02 DIAGNOSIS — Z1151 Encounter for screening for human papillomavirus (HPV): Secondary | ICD-10-CM | POA: Diagnosis not present

## 2023-06-02 DIAGNOSIS — R87610 Atypical squamous cells of undetermined significance on cytologic smear of cervix (ASC-US): Secondary | ICD-10-CM | POA: Diagnosis not present

## 2023-06-02 DIAGNOSIS — Z0000 Encounter for general adult medical examination without abnormal findings: Secondary | ICD-10-CM

## 2023-06-02 DIAGNOSIS — Z794 Long term (current) use of insulin: Secondary | ICD-10-CM

## 2023-06-02 LAB — LIPID PANEL

## 2023-06-02 LAB — PREGNANCY, URINE: Preg Test, Ur: NEGATIVE

## 2023-06-02 LAB — BAYER DCA HB A1C WAIVED: HB A1C (BAYER DCA - WAIVED): 6.4 % — ABNORMAL HIGH (ref 4.8–5.6)

## 2023-06-02 MED ORDER — AMPHETAMINE-DEXTROAMPHET ER 20 MG PO CP24: 40.0000 mg | ORAL_CAPSULE | Freq: Every day | ORAL | 0 refills | Status: DC

## 2023-06-02 MED ORDER — FLUOXETINE HCL 40 MG PO CAPS: 40.0000 mg | ORAL_CAPSULE | Freq: Every day | ORAL | 0 refills | Status: DC

## 2023-06-02 MED ORDER — AMPHETAMINE-DEXTROAMPHET ER 20 MG PO CP24: 40.0000 mg | ORAL_CAPSULE | Freq: Every morning | ORAL | 0 refills | Status: DC

## 2023-06-02 MED ORDER — MEDROXYPROGESTERONE ACETATE 150 MG/ML IM SUSY
150.0000 mg | PREFILLED_SYRINGE | INTRAMUSCULAR | Status: AC
  Administered 2023-06-02: 150 mg via INTRAMUSCULAR

## 2023-06-02 NOTE — Telephone Encounter (Signed)
 Information has been sent to clinical pharmacist for appeals review. It may take 5-7 days to prepare the necessary documentation to request the appeal from the insurance.

## 2023-06-02 NOTE — Progress Notes (Signed)
 Judith [name] is a 53 y.o. female presents to office today for annual physical exam examination.    Concerns today include: 1. Type 2 Diabetes with hypertension, hyperlipidemia:  She reports compliance with all medications.  She is currently working with clinical pharmacy to get patient assistance for her Mounjaro  and freestyle libre.  She reports no hypoglycemic episodes.  Last eye exam: Up-to-date Last foot exam: Up-to-date Last A1c:  Lab Results  Component Value Date   HGBA1C 6.5 (H) 11/02/2022   Nephropathy screen indicated?:  Up to date Last flu, zoster and/or pneumovax:  Immunization History  Administered Date(s) Administered   Influenza, Seasonal, Injecte, Preservative Fre 02/09/2018   Influenza,inj,Quad PF,6+ Mos 02/14/2017, 02/20/2019   Influenza-Unspecified 02/05/2020, 01/21/2021   MMR 02/03/1972   Moderna Sars-Covid-2 Vaccination 10/02/2019, 11/01/2019, 04/18/2020, 11/26/2020   PNEUMOCOCCAL CONJUGATE-20 11/26/2020   Pneumococcal Polysaccharide-23 02/20/2019, 11/01/2019   Td 03/03/1985, 06/30/1994   Tdap 12/24/2014, 11/26/2020   Zoster Recombinant(Shingrix ) 02/05/2020, 11/10/2022    ROS: No chest pain, shortness of breath,or falls.  She does report some intermittent dizziness and almost foggy headedness with right sided headache that is not a true migraine.  This has been going on ever since she sustained a concussion.  Would like to see neurology for further evaluation  Occupation: does not work, Marital status: married, Substance use: none Health Maintenance Due  Topic Date Due   Medicare Annual Wellness (AWV)  Never done   Cervical Cancer Screening (HPV/Pap Cotest)  11/15/2021   COVID-19 Vaccine (5 - 2024-25 season) 01/17/2023   HEMOGLOBIN A1C  05/04/2023   Diabetic kidney evaluation - Urine ACR  06/11/2023   Refills needed today: all  Immunization History  Administered Date(s) Administered   Influenza, Seasonal, Injecte, Preservative Fre 02/09/2018    Influenza,inj,Quad PF,6+ Mos 02/14/2017, 02/20/2019   Influenza-Unspecified 02/05/2020, 01/21/2021   MMR 02/03/1972   Moderna Sars-Covid-2 Vaccination 10/02/2019, 11/01/2019, 04/18/2020, 11/26/2020   PNEUMOCOCCAL CONJUGATE-20 11/26/2020   Pneumococcal Polysaccharide-23 02/20/2019, 11/01/2019   Td 03/03/1985, 06/30/1994   Tdap 12/24/2014, 11/26/2020   Zoster Recombinant(Shingrix ) 02/05/2020, 11/10/2022   Past Medical History:  Diagnosis Date   ADD (attention deficit disorder) without hyperactivity    Allergy    Anxiety    Arthritis    Asthma    Bipolar 1 disorder (HCC)    Blood transfusion without reported diagnosis    with child birth 16 yrs ago    Bronchitis, chronic (HCC)    Constipation    COVID-19 virus infection 06/21/2020   DDD (degenerative disc disease), cervical    Depression    Diabetes mellitus without complication (HCC)    Dysphagia    Family history of adverse reaction to anesthesia    mother had a hard time waking up after knee surgery in 2021   Gastroparesis    GERD (gastroesophageal reflux disease)    History of kidney stones    History of MRSA infection    Hyperlipemia    Hypothyroid    Migraines    Pneumonia    Sleep apnea    no cpap    Social History   Socioeconomic History   Marital status: Divorced    Spouse name: Not on file   Number of children: 1   Years of education: Not on file   Highest education level: Some college, no degree  Occupational History   Not on file  Tobacco Use   Smoking status: Former    Current packs/day: 0.00    Average packs/day: 1 pack/day  for 5.0 years (5.0 ttl pk-yrs)    Types: Cigarettes    Start date: 04/04/1988    Quit date: 04/04/1993    Years since quitting: 30.1   Smokeless tobacco: Never  Vaping Use   Vaping status: Every Day   Substances: CBD  Substance and Sexual Activity   Alcohol use: No   Drug use: Yes    Types: Marijuana   Sexual activity: Yes    Birth control/protection: Injection  Other  Topics Concern   Not on file  Social History Narrative   Not on file   Social Drivers of Health   Financial Resource Strain: High Risk (11/02/2022)   Overall Financial Resource Strain (CARDIA)    Difficulty of Paying Living Expenses: Very hard  Food Insecurity: No Food Insecurity (11/02/2022)   Hunger Vital Sign    Worried About Running Out of Food in the Last Year: Never true    Ran Out of Food in the Last Year: Never true  Transportation Needs: No Transportation Needs (11/02/2022)   PRAPARE - Administrator, Civil Service (Medical): No    Lack of Transportation (Non-Medical): No  Physical Activity: Unknown (11/02/2022)   Exercise Vital Sign    Days of Exercise per Week: 0 days    Minutes of Exercise per Session: Not on file  Stress: Stress Concern Present (11/02/2022)   [employer] of Occupational Health - Occupational Stress Questionnaire    Feeling of Stress : Rather much  Social Connections: Socially Isolated (11/02/2022)   Social Connection and Isolation Panel [NHANES]    Frequency of Communication with Friends and Family: More than three times a week    Frequency of Social Gatherings with Friends and Family: Never    Attends Religious Services: Never    Database administrator or Organizations: No    Attends Engineer, structural: Not on file    Marital Status: Divorced  Intimate Partner Violence: Unknown (08/20/2021)   Received from [employer], [facility]   HITS    Physically Hurt: Not on file    Insult or Talk Down To: Not on file    Threaten Physical Harm: Not on file    Scream or Curse: Not on file   Past Surgical History:  Procedure Laterality Date   ABDOMINAL SURGERY     ANTERIOR CERVICAL DECOMP/DISCECTOMY FUSION N/A 10/09/2020   Procedure: Anterior Cervical Discectomy Fusion Cervical five-six;  Surgeon: Van Gelinas, MD;  Location: [facility] OR;  Service: Neurosurgery;  Laterality: N/A;   BIOPSY  02/04/2021   Procedure: BIOPSY;  Surgeon:  Ace Holder, MD;  Location: [facility];  Service: Gastroenterology;;   BOTOX  INJECTION N/A 02/04/2021   Procedure: BOTOX  INJECTION;  Surgeon: Ace Holder, MD;  Location: [facility];  Service: Gastroenterology;  Laterality: N/A;   CARPAL TUNNEL RELEASE Bilateral 1999   CARPOMETACARPEL SUSPENSION PLASTY Left 08/05/2022   Procedure: Left thumb trapeziectomy and suspensionplasty;  Surgeon: Marilyn Shropshire, MD;  Location: [facility];  Service: Orthopedics;  Laterality: Left;  with regional block  120   CESAREAN SECTION     CHOLECYSTECTOMY     COLONOSCOPY     ESOPHAGEAL MANOMETRY N/A 07/08/2020   Procedure: ESOPHAGEAL MANOMETRY (EM);  Surgeon: Ace Holder, MD;  Location: [facility];  Service: Gastroenterology;  Laterality: N/A;   ESOPHAGOGASTRODUODENOSCOPY (EGD) WITH PROPOFOL  N/A 02/04/2021   Procedure: ESOPHAGOGASTRODUODENOSCOPY (EGD) WITH PROPOFOL ;  Surgeon: Ace Holder, MD;  Location: [facility];  Service: Gastroenterology;  Laterality: N/A;   REPLACEMENT TOTAL KNEE Bilateral    SCLEROTHERAPY  02/04/2021   Procedure: SCLEROTHERAPY;  Surgeon: Ace Holder, MD;  Location: [facility];  Service: Gastroenterology;;   THYROIDECTOMY     TONSILLECTOMY     TONSILLECTOMY AND ADENOIDECTOMY     TRIGGER FINGER RELEASE Left 06/16/2021   Procedure: LEFT THUMB TRIGGER FINGER RELEASE;  Surgeon: Tonita Frater, MD;  Location: [facility];  Service: Orthopedics;  Laterality: Left;   TRIGGER FINGER RELEASE Right 09/29/2021   Procedure: RELEASE TRIGGER FINGER/A-1 PULLEY;  Surgeon: Tonita Frater, MD;  Location: [facility];  Service: Orthopedics;  Laterality: Right;  Right ring, index and thumb trigger finger   UPPER GASTROINTESTINAL ENDOSCOPY     Family History  Problem Relation Age of Onset   Asthma Mother    Bronchiolitis Mother    Atrial fibrillation Mother    GER disease Mother    Osteoporosis Mother    Osteoarthritis Mother    Dementia Mother         mild , was medically induced   Transient ischemic attack Mother        x 2   Aneurysm Mother        thoriatic   Bipolar disorder Mother    Depression Mother    Hyperlipidemia Mother    Non-Hodgkin's lymphoma Father    Hyperlipidemia Father    Breast cancer Paternal Grandmother    Lung cancer Paternal Grandmother    Mental illness Other    Diabetes Other    Esophageal cancer Other    Colon cancer Neg Hx    Stomach cancer Neg Hx    Pancreatic cancer Neg Hx    Colon polyps Neg Hx     Current Outpatient Medications:    Accu-Chek FastClix Lancets MISC, Test blood sugars daily Dx E11.9, Disp: 102 each, Rfl: 3   ACCU-CHEK GUIDE test strip, TEST BLOOD SUGARS DAILY DX E11.9, Disp: 100 strip, Rfl: 3   amphetamine -dextroamphetamine  (ADDERALL XR) 20 MG 24 hr capsule, Take 2 capsules (40 mg total) by mouth daily., Disp: 60 capsule, Rfl: 0   amphetamine -dextroamphetamine  (ADDERALL XR) 20 MG 24 hr capsule, Take 2 capsules (40 mg total) by mouth daily., Disp: 60 capsule, Rfl: 0   amphetamine -dextroamphetamine  (ADDERALL XR) 20 MG 24 hr capsule, Take 2 capsules (40 mg total) by mouth in the morning., Disp: 60 capsule, Rfl: 0   azelastine  (ASTELIN ) 0.1 % nasal spray, Place 1 spray into both nostrils 2 (two) times daily., Disp: 30 mL, Rfl: 12   Blood Glucose Monitoring Suppl (ACCU-CHEK GUIDE ME) w/Device KIT, Use to check BS BID and as needed. DX: E11.9, Disp: 1 kit, Rfl: 0   cetirizine  (ZYRTEC ) 10 MG tablet, Take 1 tablet (10 mg total) by mouth daily as needed for allergies., Disp: 90 tablet, Rfl: 3   Continuous Glucose Sensor (FREESTYLE LIBRE 3 SENSOR) MISC, Use to check blood glucose continously, change sensor every 14 days, Disp: 2 each, Rfl: PRN   cyclobenzaprine  (FLEXERIL ) 10 MG tablet, Take 1 tablet 3 times a day by oral route as needed., Disp: , Rfl:    dexlansoprazole  (DEXILANT ) 60 MG capsule, Take 1 capsule (60 mg total) by mouth daily., Disp: 90 capsule, Rfl: 1   diclofenac  Sodium  (VOLTAREN ) 1 % GEL, Apply 2 g topically 4 (four) times daily., Disp: 400 g, Rfl: PRN   dicyclomine  (BENTYL ) 20 MG tablet, Take 1 tablet (20 mg) by mouth every 6 hours as needed for spasms (abdominal cramps).,  Disp: 120 tablet, Rfl: 0   EPINEPHrine  0.3 mg/0.3 mL IJ SOAJ injection, Inject 0.3 mg into the muscle as needed for anaphylaxis., Disp: 1 each, Rfl: 2   FLUoxetine  (PROZAC ) 40 MG capsule, Take 40 mg by mouth daily., Disp: , Rfl:    gabapentin  (NEURONTIN ) 300 MG capsule, TAKE 1 CAPSULE BY MOUTH THREE TIMES A DAY, Disp: 270 capsule, Rfl: 3   gabapentin  (NEURONTIN ) 600 MG tablet, Take 1 tablet (600 mg total) by mouth 3 (three) times daily for 14 days. HOLD gabapentin  300mg  while taking, Disp: 42 tablet, Rfl: 0   hydrOXYzine  (ATARAX ) 10 MG tablet, Take 1 tablet (10 mg total) by mouth 3 (three) times daily as needed., Disp: 30 tablet, Rfl: 0   insulin  degludec (TRESIBA ) 100 UNIT/ML FlexTouch Pen, Inject 50-60 Units into the skin daily., Disp: 18 mL, Rfl: 1   Insulin  Pen Needle (B-D UF III MINI PEN NEEDLES) 31G X 5 MM MISC, use as directed once daily Dx E11.8, Disp: 100 each, Rfl: 4   levothyroxine  (SYNTHROID ) 125 MCG tablet, Take 1 tablet (125 mcg total) by mouth daily., Disp: 90 tablet, Rfl: 3   linaclotide  (LINZESS ) 72 MCG capsule, Take 1 capsule (72 mcg total) by mouth daily before breakfast., Disp: 30 capsule, Rfl: 3   medroxyPROGESTERone  (DEPO-PROVERA ) 150 MG/ML injection, INJECT 1 ML (150 MG TOTAL) INTO THE MUSCLE EVERY 3 (THREE) MONTHS, Disp: 1 mL, Rfl: 2   Melatonin 2.5 MG CHEW, Chew by mouth., Disp: , Rfl:    Mepolizumab  (NUCALA ) 100 MG/ML SOAJ, Inject 1 mL (100 mg total) into the skin every 28 (twenty-eight) days., Disp: 3 mL, Rfl: 6   montelukast  (SINGULAIR ) 10 MG tablet, TAKE 1 TABLET BY MOUTH EVERYDAY AT BEDTIME, Disp: 90 tablet, Rfl: 1   nystatin  cream (MYCOSTATIN ), Apply 1 Application topically 2 (two) times daily. x2 weeks, Disp: 60 g, Rfl: 0   ondansetron  (ZOFRAN -ODT) 4 MG  disintegrating tablet, Take 1 tablet (4 mg total) by mouth every 8 (eight) hours as needed for nausea or vomiting., Disp: 12 tablet, Rfl: 0   pioglitazone  (ACTOS ) 45 MG tablet, Take 1 tablet (45 mg total) by mouth daily., Disp: 90 tablet, Rfl: 3   Rimegepant Sulfate (NURTEC) 75 MG TBDP, Dissolve 1 tablet in mouth at onset of migraine headache. MAX 1 tablet per 24 hours., Disp: 10 tablet, Rfl: PRN   rosuvastatin  (CRESTOR ) 40 MG tablet, Take 1 tablet (40 mg total) by mouth at bedtime., Disp: 90 tablet, Rfl: 3   tirzepatide  (MOUNJARO ) 15 MG/0.5ML Pen, Inject 15 mg into the skin once a week., Disp: 6 mL, Rfl: 3  Current Facility-Administered Medications:    medroxyPROGESTERone  (DEPO-PROVERA ) injection 150 mg, 150 mg, Intramuscular, Q90 days, Yalissa Fink M, DO, 150 mg at 03/02/23 1210  Allergies  Allergen Reactions   Diphenhydramine  Other (See Comments)    Hair feels like it is crawling   Keflex  [Cephalexin ] Hives   Morphine  Nausea And Vomiting    PCA PUMP/ DRIP -- N/V IV PUSH IN ER NO PROBLEM PER PT.     Prednisone Nausea And Vomiting   Adhesive [Tape]     Paper tape causes irritation    Erythromycin Hives   Latex    Metformin And Related     Diarrhea with IR and XR products   Sulfonamide Derivatives Other (See Comments)    Does not take due to family history     ROS: Review of Systems Pertinent items noted in HPI and remainder of comprehensive ROS otherwise negative.  Physical exam BP 127/83   Pulse (!) 110   Temp 98.7 F (37.1 C)   Ht 5\' 6"  (1.676 m)   Wt 262 lb (118.8 kg)   SpO2 97%   BMI 42.29 kg/m  General appearance: alert, cooperative, appears stated age, no distress, and morbidly obese Head: Normocephalic, without obvious abnormality, atraumatic Eyes: negative findings: lids and lashes normal, conjunctivae and sclerae normal, and corneas clear Ears: normal TM's and external ear canals both ears Nose: Nares normal. Septum midline. Mucosa normal. No drainage or  sinus tenderness. Throat: lips, mucosa, and tongue normal; teeth are absent Neck: no adenopathy, supple, symmetrical, trachea midline, and thyroid  not enlarged, symmetric, no tenderness/mass/nodules Back: symmetric, no curvature. ROM normal. No CVA tenderness. Lungs: clear to auscultation bilaterally Heart: regular rate and rhythm, S1, S2 normal, no murmur, click, rub or gallop Abdomen: soft, non-tender; bowel sounds normal; no masses,  no organomegaly Extremities: extremities normal, atraumatic, no cyanosis or edema Pulses: 2+ and symmetric GU: External vagina atrophic.  No appreciable lesions externally.  Vaginal vault normal.  Her cervix is high and slightly to the right.  Otherwise unremarkable.  No appreciable lesions or exudates.  She has scant bleeding after Pap smear. Skin: Skin color, texture, turgor normal. No rashes or lesions Lymph nodes: Cervical, supraclavicular, and axillary nodes normal. Neurologic: Grossly normal      06/02/2023    1:55 PM 02/22/2023   12:14 PM 12/07/2022    3:14 PM  Depression screen PHQ 2/9  Decreased Interest 1 1 1   Down, Depressed, Hopeless 0 0 1  PHQ - 2 Score 1 1 2   Altered sleeping 3 2 0  Tired, decreased energy 3 2 1   Change in appetite 0 0 0  Feeling bad or failure about yourself  1 0 0  Trouble concentrating 0 0 2  Moving slowly or fidgety/restless 0 0 0  Suicidal thoughts 0 0 0  PHQ-9 Score 8 5 5   Difficult doing work/chores Somewhat difficult Somewhat difficult Somewhat difficult      06/02/2023    1:54 PM 02/22/2023   12:14 PM 12/07/2022    3:14 PM 11/10/2022    9:53 AM  GAD 7 : Generalized Anxiety Score  Nervous, Anxious, on Edge 1 1 2 3   Control/stop worrying 0 0 1 0  Worry too much - different things 3 0 0 0  Trouble relaxing 3 0 1 0  Restless 0 0 0 2  Easily annoyed or irritable 0 2 1 1   Afraid - awful might happen 1 0 0 0  Total GAD 7 Score 8 3 5 6   Anxiety Difficulty  Somewhat difficult Somewhat difficult Somewhat difficult      Assessment/ Plan: Judith [name] here for annual physical exam.   Annual physical exam  Atypical migraine - Plan: Ambulatory referral to Neurology  Screening for malignant neoplasm of cervix - Plan: Cytology - PAP  Controlled type 2 diabetes mellitus with complication, with long-term current use of insulin  (HCC) - Plan: Bayer DCA Hb A1c Waived, CMP14+EGFR, CBC, Microalbumin / creatinine urine ratio  Hypertension associated with diabetes (HCC) - Plan: CMP14+EGFR  Hyperlipidemia associated with type 2 diabetes mellitus (HCC) - Plan: CMP14+EGFR, Lipid Panel  Acquired hypothyroidism - Plan: TSH + free T4  Attention deficit disorder (ADD) without hyperactivity - Plan: ToxASSURE Select 13 (MW), Urine, amphetamine -dextroamphetamine  (ADDERALL XR) 20 MG 24 hr capsule, amphetamine -dextroamphetamine  (ADDERALL XR) 20 MG 24 hr capsule, amphetamine -dextroamphetamine  (ADDERALL XR) 20 MG 24 hr capsule  Morbid obesity (  HCC) - Plan: CMP14+EGFR, FSH/LH  Encounter for Depo-Provera  contraception - Plan: Pregnancy, urine, FSH/LH, medroxyPROGESTERone  Acetate SUSY 150 mg  Referral to neurology for what sounds like may be an atypical migraine.  Difficult to discern.  Pap smear completed today.  Pelvic exam fairly unremarkable.  We did discuss consideration for transition off of Depo-Provera  but she is interested in knowing if she is postmenopausal because she is still intact and does not want to become pregnant in her 52s.  FSH and LH has been ordered.  Depo-Medrol  administered and urine pregnancy negative today  I have ordered labs for blood sugar testing and this was controlled.  Urine microalbumin collected today.  She will continue blood pressure and blood cholesterol medications as prescribed.  BP is controlled today  Thyroid  levels collected.  Clinically asymptomatic from a thyroid  standpoint  ADHD is chronic and stable.  UDS and CSC were updated as per office policy.  The national narcotic  database reviewed and there were no red flags.  Counseled on healthy lifestyle choices, including diet (rich in fruits, vegetables and lean meats and low in salt and simple carbohydrates) and exercise (at least 30 minutes of moderate physical activity daily).  Patient to follow up 16m for ADHD  Tikesha Mort M. Bonnell Butcher, DO

## 2023-06-02 NOTE — Telephone Encounter (Signed)
 Follow up on PA - pt is in the office today for apt - states she is out of her medication for this week needs a refill

## 2023-06-03 ENCOUNTER — Encounter: Payer: Self-pay | Admitting: Pharmacist

## 2023-06-03 ENCOUNTER — Other Ambulatory Visit: Payer: Self-pay | Admitting: Family Medicine

## 2023-06-03 ENCOUNTER — Other Ambulatory Visit (HOSPITAL_COMMUNITY): Payer: Self-pay

## 2023-06-03 ENCOUNTER — Telehealth: Payer: Self-pay | Admitting: Pharmacist

## 2023-06-03 ENCOUNTER — Other Ambulatory Visit: Payer: Self-pay

## 2023-06-03 DIAGNOSIS — K580 Irritable bowel syndrome with diarrhea: Secondary | ICD-10-CM

## 2023-06-03 LAB — CMP14+EGFR
ALT: 27 IU/L (ref 0–32)
AST: 20 [IU]/L (ref 0–40)
Albumin: 4.2 g/dL (ref 3.8–4.9)
Alkaline Phosphatase: 113 IU/L (ref 44–121)
BUN/Creatinine Ratio: 18 (ref 9–23)
BUN: 17 mg/dL (ref 6–24)
Bilirubin Total: 0.2 mg/dL (ref 0.0–1.2)
CO2: 24 mmol/L (ref 20–29)
Calcium: 9.2 mg/dL (ref 8.7–10.2)
Chloride: 103 mmol/L (ref 96–106)
Creatinine, Ser: 0.96 mg/dL (ref 0.57–1.00)
Globulin, Total: 2.4 g/dL (ref 1.5–4.5)
Glucose: 89 mg/dL (ref 70–99)
Potassium: 4.6 mmol/L (ref 3.5–5.2)
Sodium: 139 mmol/L (ref 134–144)
Total Protein: 6.6 g/dL (ref 6.0–8.5)
eGFR: 71 mL/min/{1.73_m2} (ref >59)

## 2023-06-03 LAB — LIPID PANEL
Cholesterol, Total: 144 mg/dL (ref 100–199)
HDL: 33 mg/dL — ABNORMAL LOW (ref >39)
LDL CALC COMMENT:: 4.4 {ratio} (ref 0.0–4.4)
LDL Chol Calc (NIH): 82 mg/dL (ref 0–99)
Triglycerides: 164 mg/dL — ABNORMAL HIGH (ref 0–149)
VLDL Cholesterol Cal: 29 mg/dL (ref 5–40)

## 2023-06-03 LAB — CBC
Hematocrit: 44.5 % (ref 34.0–46.6)
Hemoglobin: 14.7 g/dL (ref 11.1–15.9)
MCH: 28.5 pg (ref 26.6–33.0)
MCHC: 33 g/dL (ref 31.5–35.7)
MCV: 86 fL (ref 79–97)
Platelets: 411 10*3/uL (ref 150–450)
RBC: 5.15 x10E6/uL (ref 3.77–5.28)
RDW: 13 % (ref 11.7–15.4)
WBC: 11.5 10*3/uL — ABNORMAL HIGH (ref 3.4–10.8)

## 2023-06-03 LAB — MICROALBUMIN / CREATININE URINE RATIO
Creatinine, Urine: 152.7 mg/dL
Microalb/Creat Ratio: 6 mg/g{creat} (ref 0–29)
Microalbumin, Urine: 8.4 ug/mL

## 2023-06-03 LAB — FSH/LH
FSH: 39 m[IU]/mL
LH: 21.4 m[IU]/mL

## 2023-06-03 LAB — TSH+FREE T4
Free T4: 1.26 ng/dL (ref 0.82–1.77)
TSH: 0.044 u[IU]/mL — ABNORMAL LOW (ref 0.450–4.500)

## 2023-06-03 MED ORDER — DICYCLOMINE HCL 20 MG PO TABS
20.0000 mg | ORAL_TABLET | Freq: Four times a day (QID) | ORAL | 2 refills | Status: AC | PRN
  Filled 2023-06-03: qty 120, 30d supply, fill #0

## 2023-06-03 NOTE — Telephone Encounter (Signed)
Upon further investigation, the patient is covered by [employer]. We were able to process the claim For Mounjaro 15mg  using the appropriate diagnosis code and received a paid claim of $4.80.  Patient has been notified via a voicemail message and a MyChart message.  Thank you, Dellie Burns, PharmD Clinical Pharmacist  [place]  Direct Dial: [phone]

## 2023-06-04 ENCOUNTER — Other Ambulatory Visit: Payer: Self-pay

## 2023-06-04 ENCOUNTER — Encounter: Payer: Self-pay | Admitting: Family Medicine

## 2023-06-04 ENCOUNTER — Other Ambulatory Visit (HOSPITAL_COMMUNITY): Payer: Self-pay

## 2023-06-04 LAB — TOXASSURE SELECT 13 (MW), URINE

## 2023-06-04 MED ORDER — CYCLOBENZAPRINE HCL 10 MG PO TABS
10.0000 mg | ORAL_TABLET | Freq: Three times a day (TID) | ORAL | 1 refills | Status: AC | PRN
  Filled 2023-06-04: qty 30, 10d supply, fill #0

## 2023-06-06 ENCOUNTER — Encounter: Payer: Self-pay | Admitting: Family Medicine

## 2023-06-06 ENCOUNTER — Ambulatory Visit
Admission: RE | Admit: 2023-06-06 | Discharge: 2023-06-06 | Disposition: A | Discharge status: Home / Self Care | Payer: Medicare Other | Source: Ambulatory Visit | Attending: Emergency Medicine | Admitting: Emergency Medicine

## 2023-06-06 VITALS — BP 131/91 | HR 97 | Temp 98.2°F | Resp 20

## 2023-06-06 DIAGNOSIS — J069 Acute upper respiratory infection, unspecified: Secondary | ICD-10-CM | POA: Diagnosis not present

## 2023-06-06 DIAGNOSIS — J309 Allergic rhinitis, unspecified: Secondary | ICD-10-CM | POA: Diagnosis not present

## 2023-06-06 DIAGNOSIS — J4531 Mild persistent asthma with (acute) exacerbation: Secondary | ICD-10-CM

## 2023-06-06 LAB — POC COVID19/FLU A&B COMBO
Covid Antigen, POC: NEGATIVE
Influenza A Antigen, POC: NEGATIVE
Influenza B Antigen, POC: NEGATIVE

## 2023-06-06 MED ORDER — ALBUTEROL SULFATE HFA 108 (90 BASE) MCG/ACT IN AERS: 2.0000 | INHALATION_SPRAY | Freq: Four times a day (QID) | RESPIRATORY_TRACT | 2 refills | Status: AC | PRN

## 2023-06-06 NOTE — Discharge Instructions (Signed)
Your influenza and COVID-19 tests today are negative.  No further testing is needed.  Physical exam findings do not support a diagnosis of bronchitis at this time.  For this reason, steroids are not indicated as they will cause more harm than benefit.  I believe that it is likely you are suffering from a viral infection secondary to uncontrolled allergies at this time.  I recommend that you use Zyrtec and azelastine nasal spray daily as prescribed in addition to daily Singulair, you still have refills available for both medications.    I have provided you with a prescription for an albuterol inhaler, I see that you do not have an active prescription for this.  I would like for you to inhale 2 puffs several times a day, particularly when you are coughing excessively or feel a burning sensation in your chest.  If you are not feeling better in the next 3 to 5 days or if you begin to feel worse in the next 1 to 2 days, please return for repeat evaluation.  Thank you for visiting [facility] today.

## 2023-06-06 NOTE — ED Triage Notes (Signed)
Patient states Monday she began having sinus drainage, cough, sinus pressure and Wednesday she began having a sore throat, bilateral ear discomfort, and chest tightness when coughing.  Home interventions: Tylenol

## 2023-06-06 NOTE — ED Provider Notes (Signed)
[facility]    CSN: [id number] Arrival date & time: 06/06/23  1406    HISTORY   Chief Complaint  Patient presents with   Sore Throat    Throat hurts feels raw, Cough with some mucus but not much , ears hurt, congested chest hurts when coughing , neck sore ( glands ) loosing voice - Entered by patient   Nasal Congestion   Facial Pain   Cough   Ear Pain   HPI Judith [name] is a pleasant, 53 y.o. female who presents to urgent care today. Patient states that 7 days ago, she began to have postnasal drip, cough intermittently productive of dark-colored sputum, bilateral sinus pressure.  Patient states that 5 days ago, she began to have a sore, burning throat, burning in both of her ears, chest tightness and burning in her chest when coughing.  Patient denies fever, body aches, chills, nausea, vomiting, diarrhea, pain with swallowing, malaise, hearing loss, nasal congestion, shortness of breath.  Patient reports a history of frequent episodes of bronchitis, states she has noticed that if she does not "catch it" early, it will trigger her asthma.  EMR reviewed, patient has prescriptions for cetirizine, azelastine and Singulair, does not appear that she is using the cetirizine and azelastine regularly but does seem to be taking the Singulair regularly.  Vital signs are normal on arrival today, patient appears to be in no acute distress.  The history is provided by the patient.   Past Medical History:  Diagnosis Date   ADD (attention deficit disorder) without hyperactivity    Allergy    Anxiety    Arthritis    Asthma    Bipolar 1 disorder (HCC)    Blood transfusion without reported diagnosis    with child birth 16 yrs ago    Bronchitis, chronic (HCC)    Constipation    COVID-19 virus infection 06/21/2020   DDD (degenerative disc disease), cervical    Depression    Diabetes mellitus without complication (HCC)    Dysphagia    Family history of adverse reaction to anesthesia     mother had a hard time waking up after knee surgery in 2021   Gastroparesis    GERD (gastroesophageal reflux disease)    History of kidney stones    History of MRSA infection    Hyperlipemia    Hypothyroid    Migraines    Pneumonia    Sleep apnea    no cpap    Patient Active Problem List   Diagnosis Date Noted   Gastroesophageal reflux disease with esophagitis without hemorrhage 02/22/2023   Esophagogastric junction outflow obstruction    Gastric polyp    Cervical myelopathy (HCC) 10/09/2020   Mild intermittent asthma with acute exacerbation 10/04/2020   Dysphagia    Patellofemoral pain syndrome of both knees 04/19/2020   Tinnitus of right ear 03/15/2020   Closed fracture of one rib of left side 03/15/2020   Concussion with no loss of consciousness 02/05/2020   Spondylosis of cervical spine 02/05/2020   Acute bilateral thoracic back pain 02/05/2020   Acute bilateral low back pain without sciatica 02/05/2020   Gastroparesis 11/03/2019   Hospital discharge follow-up 11/03/2019   DDD (degenerative disc disease), lumbar 06/30/2019   Lumbar back pain with radiculopathy affecting lower extremity 06/20/2019   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/21/2019   History of left knee replacement 02/10/2018   Morbid obesity (HCC) 11/21/2017   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  Episodic mood disorder (HCC) 05/12/2016   Controlled diabetes mellitus type 2 with complications (HCC) 05/12/2016   Gastroesophageal reflux disease without esophagitis 05/12/2016   DYSPNEA 07/22/2010   CHEST PAIN 07/22/2010   Hypothyroidism 09/13/2007   Hyperlipidemia 09/13/2007   Depression, major, single episode, moderate (HCC) 09/13/2007   Migraine headache 09/13/2007   Essential hypertension 09/13/2007   Asthma 09/13/2007   CHOLELITHIASIS, HX OF 09/13/2007   NEPHROLITHIASIS, HX OF 09/13/2007   CARPAL TUNNEL RELEASE, HX OF 09/13/2007   Past Surgical History:   Procedure Laterality Date   ABDOMINAL SURGERY     ANTERIOR CERVICAL DECOMP/DISCECTOMY FUSION N/A 10/09/2020   Procedure: Anterior Cervical Discectomy Fusion Cervical five-six;  Surgeon: Bedelia Person, MD;  Location: [facility] OR;  Service: Neurosurgery;  Laterality: N/A;   BIOPSY  02/04/2021   Procedure: BIOPSY;  Surgeon: Benancio Deeds, MD;  Location: [facility];  Service: Gastroenterology;;   BOTOX INJECTION N/A 02/04/2021   Procedure: BOTOX INJECTION;  Surgeon: Benancio Deeds, MD;  Location: [facility];  Service: Gastroenterology;  Laterality: N/A;   CARPAL TUNNEL RELEASE Bilateral 1999   CARPOMETACARPEL SUSPENSION PLASTY Left 08/05/2022   Procedure: Left thumb trapeziectomy and suspensionplasty;  Surgeon: Marlyne Beards, MD;  Location: [facility];  Service: Orthopedics;  Laterality: Left;  with regional block  120   CESAREAN SECTION     CHOLECYSTECTOMY     COLONOSCOPY     ESOPHAGEAL MANOMETRY N/A 07/08/2020   Procedure: ESOPHAGEAL MANOMETRY (EM);  Surgeon: Benancio Deeds, MD;  Location: [facility];  Service: Gastroenterology;  Laterality: N/A;   ESOPHAGOGASTRODUODENOSCOPY (EGD) WITH PROPOFOL N/A 02/04/2021   Procedure: ESOPHAGOGASTRODUODENOSCOPY (EGD) WITH PROPOFOL;  Surgeon: Benancio Deeds, MD;  Location: [facility];  Service: Gastroenterology;  Laterality: N/A;   REPLACEMENT TOTAL KNEE Bilateral    SCLEROTHERAPY  02/04/2021   Procedure: SCLEROTHERAPY;  Surgeon: Benancio Deeds, MD;  Location: [facility];  Service: Gastroenterology;;   THYROIDECTOMY     TONSILLECTOMY     TONSILLECTOMY AND ADENOIDECTOMY     TRIGGER FINGER RELEASE Left 06/16/2021   Procedure: LEFT THUMB TRIGGER FINGER RELEASE;  Surgeon: Oliver Barre, MD;  Location: [facility];  Service: Orthopedics;  Laterality: Left;   TRIGGER FINGER RELEASE Right 09/29/2021   Procedure: RELEASE TRIGGER FINGER/A-1 PULLEY;  Surgeon: Oliver Barre, MD;  Location: [facility];  Service:  Orthopedics;  Laterality: Right;  Right ring, index and thumb trigger finger   UPPER GASTROINTESTINAL ENDOSCOPY     OB History     Gravida  1   Para  1   Term  1   Preterm      AB      Living         SAB      IAB      Ectopic      Multiple      Live Births             Home Medications    Prior to Admission medications   Medication Sig Start Date End Date Taking? Authorizing Provider  Accu-Chek FastClix Lancets MISC Test blood sugars daily Dx E11.9 11/27/21   Delynn Flavin M, DO  ACCU-CHEK GUIDE test strip TEST BLOOD SUGARS DAILY DX E11.9 02/18/23   Delynn Flavin M, DO  amphetamine-dextroamphetamine (ADDERALL XR) 20 MG 24 hr capsule Take 2 capsules (40 mg total) by mouth daily. 06/06/23   Raliegh Ip, DO  amphetamine-dextroamphetamine (ADDERALL XR) 20 MG 24 hr capsule Take 2 capsules (  40 mg total) by mouth daily. 07/06/23   Raliegh Ip, DO  amphetamine-dextroamphetamine (ADDERALL XR) 20 MG 24 hr capsule Take 2 capsules (40 mg total) by mouth in the morning. 08/03/23   Raliegh Ip, DO  azelastine (ASTELIN) 0.1 % nasal spray Place 1 spray into both nostrils 2 (two) times daily. 11/02/22   Raliegh Ip, DO  Blood Glucose Monitoring Suppl (ACCU-CHEK GUIDE ME) w/Device KIT Use to check BS BID and as needed. DX: E11.9 01/26/22   Raliegh Ip, DO  cetirizine (ZYRTEC) 10 MG tablet Take 1 tablet (10 mg total) by mouth daily as needed for allergies. 12/22/22   Raliegh Ip, DO  Continuous Glucose Sensor (FREESTYLE LIBRE 3 SENSOR) MISC Use to check blood glucose continously, change sensor every 14 days 03/29/23   Delynn Flavin M, DO  cyclobenzaprine (FLEXERIL) 10 MG tablet Take 1 tablet (10 mg total) by mouth 3 (three) times daily as needed for muscle spasms. 06/04/23   Raliegh Ip, DO  dexlansoprazole (DEXILANT) 60 MG capsule Take 1 capsule (60 mg total) by mouth daily. 11/17/22   Armbruster, Willaim Rayas, MD  diclofenac Sodium  (VOLTAREN) 1 % GEL Apply 2 g topically 4 (four) times daily. 05/05/23   Raliegh Ip, DO  dicyclomine (BENTYL) 20 MG tablet Take 1 tablet (20 mg) by mouth every 6 hours as needed for spasms (abdominal cramps). 06/03/23   Raliegh Ip, DO  EPINEPHrine 0.3 mg/0.3 mL IJ SOAJ injection Inject 0.3 mg into the muscle as needed for anaphylaxis. 11/25/22   Hunsucker, Lesia Sago, MD  FLUoxetine (PROZAC) 40 MG capsule Take 1 capsule (40 mg total) by mouth daily. Further fills per psychiatry 06/02/23   Raliegh Ip, DO  gabapentin (NEURONTIN) 300 MG capsule TAKE 1 CAPSULE BY MOUTH THREE TIMES A DAY 11/02/22   Delynn Flavin M, DO  hydrOXYzine (ATARAX) 10 MG tablet Take 1 tablet (10 mg total) by mouth 3 (three) times daily as needed. 05/31/23   Raliegh Ip, DO  insulin degludec (TRESIBA) 100 UNIT/ML FlexTouch Pen Inject 50-60 Units into the skin daily. 05/28/23   Mechele Claude, MD  Insulin Pen Needle (B-D UF III MINI PEN NEEDLES) 31G X 5 MM MISC use as directed once daily Dx E11.8 04/12/23   Raliegh Ip, DO  levothyroxine (SYNTHROID) 125 MCG tablet Take 1 tablet (125 mcg total) by mouth daily. 11/02/22   Raliegh Ip, DO  linaclotide (LINZESS) 72 MCG capsule Take 1 capsule (72 mcg total) by mouth daily before breakfast. 02/10/22   Delynn Flavin M, DO  medroxyPROGESTERone (DEPO-PROVERA) 150 MG/ML injection INJECT 1 ML (150 MG TOTAL) INTO THE MUSCLE EVERY 3 (THREE) MONTHS 11/02/22   Raliegh Ip, DO  Melatonin 2.5 MG CHEW Chew by mouth.    [provider]  Mepolizumab (NUCALA) 100 MG/ML SOAJ Inject 1 mL (100 mg total) into the skin every 28 (twenty-eight) days. 12/25/22   Hunsucker, Lesia Sago, MD  montelukast (SINGULAIR) 10 MG tablet TAKE 1 TABLET BY MOUTH EVERYDAY AT BEDTIME 05/31/23   Delynn Flavin M, DO  nystatin cream (MYCOSTATIN) Apply 1 Application topically 2 (two) times daily. x2 weeks 05/06/23   Hermanns, Ashlee P, PA-C  ondansetron (ZOFRAN-ODT) 4  MG disintegrating tablet Take 1 tablet (4 mg total) by mouth every 8 (eight) hours as needed for nausea or vomiting. 05/06/23   Hermanns, Ashlee P, PA-C  pioglitazone (ACTOS) 45 MG tablet Take 1 tablet (45 mg total) by mouth daily.  11/02/22   Raliegh Ip, DO  Rimegepant Sulfate (NURTEC) 75 MG TBDP Dissolve 1 tablet in mouth at onset of migraine headache. MAX 1 tablet per 24 hours. 11/10/22   Raliegh Ip, DO  rosuvastatin (CRESTOR) 40 MG tablet Take 1 tablet (40 mg total) by mouth at bedtime. 11/02/22   Raliegh Ip, DO  tirzepatide [facility]) 15 MG/0.5ML Pen Inject 15 mg into the skin once a week. 10/13/22       Family History Family History  Problem Relation Age of Onset   Asthma Mother    Bronchiolitis Mother    Atrial fibrillation Mother    GER disease Mother    Osteoporosis Mother    Osteoarthritis Mother    Dementia Mother        mild , was medically induced   Transient ischemic attack Mother        x 2   Aneurysm Mother        thoriatic   Bipolar disorder Mother    Depression Mother    Hyperlipidemia Mother    Non-Hodgkin's lymphoma Father    Hyperlipidemia Father    Breast cancer Paternal Grandmother    Lung cancer Paternal Grandmother    Mental illness Other    Diabetes Other    Esophageal cancer Other    Colon cancer Neg Hx    Stomach cancer Neg Hx    Pancreatic cancer Neg Hx    Colon polyps Neg Hx    Social History Social History   Tobacco Use   Smoking status: Former    Current packs/day: 0.00    Average packs/day: 1 pack/day for 5.0 years (5.0 ttl pk-yrs)    Types: Cigarettes    Start date: 04/04/1988    Quit date: 04/04/1993    Years since quitting: 30.1   Smokeless tobacco: Never  Vaping Use   Vaping status: Every Day   Substances: CBD  Substance Use Topics   Alcohol use: No   Drug use: Yes    Types: Marijuana   Allergies   Diphenhydramine, Keflex [cephalexin], Morphine, Prednisone, Adhesive [tape], Erythromycin, Latex,  Metformin and related, and Sulfonamide derivatives  Review of Systems Review of Systems Pertinent findings revealed after performing a 14 point review of systems has been noted in the history of present illness.  Physical Exam Vital Signs BP (!) 131/91 (BP Location: Right Arm)   Pulse 97   Temp 98.2 F (36.8 C) (Oral)   Resp 20   SpO2 97%   No data found.  Physical Exam Vitals and nursing note reviewed.  Constitutional:      General: She is not in acute distress.    Appearance: Normal appearance. She is not ill-appearing.  HENT:     Head: Normocephalic and atraumatic.     Salivary Glands: Right salivary gland is not diffusely enlarged or tender. Left salivary gland is not diffusely enlarged or tender.     Right Ear: Hearing, tympanic membrane and external ear normal. No drainage. No middle ear effusion. There is no impacted cerumen. Tympanic membrane is not erythematous or bulging.     Left Ear: Hearing, tympanic membrane and external ear normal. No drainage.  No middle ear effusion. There is no impacted cerumen. Tympanic membrane is not erythematous or bulging.     Ears:     Comments: Bilateral EACs with erythema, left greater than right    Nose: Nose normal. No nasal deformity, septal deviation, mucosal edema, congestion or rhinorrhea.  Right Turbinates: Not enlarged, swollen or pale.     Left Turbinates: Not enlarged, swollen or pale.     Right Sinus: No maxillary sinus tenderness or frontal sinus tenderness.     Left Sinus: No maxillary sinus tenderness or frontal sinus tenderness.     Mouth/Throat:     Lips: Pink. No lesions.     Mouth: Mucous membranes are moist. No oral lesions.     Pharynx: Oropharynx is clear. Uvula midline. No posterior oropharyngeal erythema or uvula swelling.     Tonsils: No tonsillar exudate. 0 on the right. 0 on the left.  Eyes:     General: Lids are normal.        Right eye: No discharge.        Left eye: No discharge.     Extraocular  Movements: Extraocular movements intact.     Conjunctiva/sclera: Conjunctivae normal.     Right eye: Right conjunctiva is not injected.     Left eye: Left conjunctiva is not injected.  Neck:     Trachea: Trachea and phonation normal.  Cardiovascular:     Rate and Rhythm: Normal rate and regular rhythm.     Pulses: Normal pulses.     Heart sounds: Normal heart sounds. No murmur heard.    No friction rub. No gallop.  Pulmonary:     Effort: Pulmonary effort is normal. No tachypnea, bradypnea, accessory muscle usage, prolonged expiration, respiratory distress or retractions.     Breath sounds: Normal breath sounds and air entry. No stridor, decreased air movement or transmitted upper airway sounds. No decreased breath sounds, wheezing, rhonchi or rales.  Chest:     Chest wall: No tenderness.  Musculoskeletal:        General: Normal range of motion.     Cervical back: Normal range of motion and neck supple. Normal range of motion.  Lymphadenopathy:     Cervical: No cervical adenopathy.  Skin:    General: Skin is warm and dry.     Findings: No erythema or rash.  Neurological:     General: No focal deficit present.     Mental Status: She is alert and oriented to person, place, and time.  Psychiatric:        Mood and Affect: Mood normal.        Behavior: Behavior normal.     Visual Acuity Right Eye Distance:   Left Eye Distance:   Bilateral Distance:    Right Eye Near:   Left Eye Near:    Bilateral Near:     UC Couse / Diagnostics / Procedures:     Radiology No results found.  Procedures Procedures (including critical care time) EKG  Pending results:  Labs Reviewed  POC COVID19/FLU A&B COMBO    Medications Ordered in UC: Medications - No data to display  UC Diagnoses / Final Clinical Impressions(s)   I have reviewed the triage vital signs and the nursing notes.  Pertinent labs & imaging results that were available during my care of the patient were reviewed by  me and considered in my medical decision making (see chart for details).    Final diagnoses:  Viral URI with cough  Allergic rhinitis, unspecified seasonality, unspecified trigger   Patient was advised of negative COVID-19 and influenza testing.  Physical exam is not concerning for bronchitis at this time.  Patient advised to take allergy medications as prescribed, patient provided with a prescription for an albuterol inhaler to help her avoid asthma flareup.  Conservative care recommended.  Return cautions advised.  Please see discharge instructions below for details of plan of care as provided to patient. ED Prescriptions     Medication Sig Dispense Auth. Provider   albuterol (VENTOLIN HFA) 108 (90 Base) MCG/ACT inhaler Inhale 2 puffs into the lungs every 6 (six) hours as needed for wheezing or shortness of breath (Cough). 18 g Theadora Rama Scales, PA-C      PDMP not reviewed this encounter.  Pending results:  Labs Reviewed  POC COVID19/FLU A&B COMBO      Discharge Instructions      Your influenza and COVID-19 tests today are negative.  No further testing is needed.  Physical exam findings do not support a diagnosis of bronchitis at this time.  For this reason, steroids are not indicated as they will cause more harm than benefit.  I believe that it is likely you are suffering from a viral infection secondary to uncontrolled allergies at this time.  I recommend that you use Zyrtec and azelastine nasal spray daily as prescribed in addition to daily Singulair, you still have refills available for both medications.    I have provided you with a prescription for an albuterol inhaler, I see that you do not have an active prescription for this.  I would like for you to inhale 2 puffs several times a day, particularly when you are coughing excessively or feel a burning sensation in your chest.  If you are not feeling better in the next 3 to 5 days or if you begin to feel worse in the  next 1 to 2 days, please return for repeat evaluation.  Thank you for visiting [facility] today.      Disposition Upon Discharge:  Condition: stable for discharge home  Patient presented with an acute illness with associated systemic symptoms and significant discomfort requiring urgent management. In my opinion, this is a condition that a prudent lay person (someone who possesses an average knowledge of health and medicine) may potentially expect to result in complications if not addressed urgently such as respiratory distress, impairment of bodily function or dysfunction of bodily organs.   Routine symptom specific, illness specific and/or disease specific instructions were discussed with the patient and/or caregiver at length.   As such, the patient has been evaluated and assessed, work-up was performed and treatment was provided in alignment with urgent care protocols and evidence based medicine.  Patient/parent/caregiver has been advised that the patient may require follow up for further testing and treatment if the symptoms continue in spite of treatment, as clinically indicated and appropriate.  Patient/parent/caregiver has been advised to return to the [facility] or PCP if no better; to PCP or the Emergency Department if new signs and symptoms develop, or if the current signs or symptoms continue to change or worsen for further workup, evaluation and treatment as clinically indicated and appropriate  The patient will follow up with their current PCP if and as advised. If the patient does not currently have a PCP we will assist them in obtaining one.   The patient may need specialty follow up if the symptoms continue, in spite of conservative treatment and management, for further workup, evaluation, consultation and treatment as clinically indicated and appropriate.  Patient/parent/caregiver verbalized understanding and agreement of plan as discussed.  All questions were addressed  during visit.  Please see discharge instructions below for further details of plan.  This office note has been dictated using Teaching laboratory technician.  Unfortunately, this method of dictation can sometimes lead to typographical or grammatical errors.  I apologize for your inconvenience in advance if this occurs.  Please do not hesitate to reach out to me if clarification is needed.      Theadora Rama Scales, PA-C 06/06/23 [phone]

## 2023-06-07 ENCOUNTER — Encounter: Payer: Self-pay | Admitting: Family Medicine

## 2023-06-07 ENCOUNTER — Other Ambulatory Visit: Payer: Self-pay

## 2023-06-07 ENCOUNTER — Other Ambulatory Visit (HOSPITAL_COMMUNITY): Payer: Self-pay

## 2023-06-07 ENCOUNTER — Ambulatory Visit (HOSPITAL_COMMUNITY): Payer: Medicare Other

## 2023-06-07 LAB — CYTOLOGY - PAP
Comment: NEGATIVE
Diagnosis: UNDETERMINED — AB
High risk HPV: NEGATIVE

## 2023-06-07 MED ORDER — ALBUTEROL SULFATE (2.5 MG/3ML) 0.083% IN NEBU: 2.5000 mg | INHALATION_SOLUTION | Freq: Four times a day (QID) | RESPIRATORY_TRACT | 1 refills | Status: DC | PRN

## 2023-06-08 ENCOUNTER — Other Ambulatory Visit: Payer: Self-pay

## 2023-06-09 DIAGNOSIS — Z3042 Encounter for surveillance of injectable contraceptive: Secondary | ICD-10-CM

## 2023-06-10 ENCOUNTER — Telehealth: Payer: Self-pay | Admitting: Family Medicine

## 2023-06-10 NOTE — Telephone Encounter (Signed)
Referral has been redirected to [facility] Neurology as Patient requested.

## 2023-06-10 NOTE — Telephone Encounter (Signed)
Copied from CRM [id number]. Topic: Referral - Question >> Jun 10, 2023 11:15 AM Fuller Mandril wrote: Reason for CRM: Patient called states a referral for Neurology was sent for her and she spoke with the place and the 1st appt they have is in May. She wants to know if it is possible for referral to be sent to [facility] - phone [phone] at [address]. Thank You

## 2023-06-11 ENCOUNTER — Other Ambulatory Visit (HOSPITAL_BASED_OUTPATIENT_CLINIC_OR_DEPARTMENT_OTHER): Payer: Self-pay

## 2023-06-11 ENCOUNTER — Other Ambulatory Visit (HOSPITAL_COMMUNITY): Payer: Self-pay

## 2023-06-11 MED FILL — Continuous Glucose System Sensor: 28 days supply | Qty: 2 | Fill #2 | Status: CN

## 2023-06-16 ENCOUNTER — Other Ambulatory Visit: Payer: Self-pay | Admitting: Family Medicine

## 2023-06-16 MED ORDER — FREESTYLE LIBRE 3 SENSOR MISC: 99 refills | Status: DC

## 2023-06-16 NOTE — Telephone Encounter (Signed)
Copied from CRM [id number]. Topic: Clinical - Medication Refill >> Jun 16, 2023 12:21 PM Shelah Lewandowsky wrote: Most Recent Primary Care Visit:  Provider: Raliegh Ip  Department: [facility]  Visit Type: PHYSICAL  Date: 06/02/2023  Medication: Continuous Glucose Sensor (FREESTYLE LIBRE 3 SENSOR) MISC   Has the patient contacted their pharmacy? Yes (Agent: If no, request that the patient contact the pharmacy for the refill. If patient does not wish to contact the pharmacy document the reason why and proceed with request.) (Agent: If yes, when and what did the pharmacy advise?)  Need a whole new prescription to bill to Medicare Part B  Is this the correct pharmacy for this prescription? Yes If no, delete pharmacy and type the correct one.  This is the patient's preferred pharmacy:  [id number] - [place], [place] - [address] [place] [address] Phone: [phone] Fax: [phone]   Has the prescription been filled recently? Yes  Is the patient out of the medication? no  Has the patient been seen for an appointment in the last year OR does the patient have an upcoming appointment? Yes  Can we respond through MyChart? No  Agent: Please be advised that Rx refills may take up to 3 business days. We ask that you follow-up with your pharmacy.

## 2023-06-18 ENCOUNTER — Telehealth: Payer: Self-pay | Admitting: Family Medicine

## 2023-06-18 NOTE — Telephone Encounter (Unsigned)
Copied from CRM [id number]. Topic: General - Other >> Jun 18, 2023  3:36 PM Monisha R wrote: Reason for CRM: Patient wants to go to a different location for referral  [facility]  [address] [place]  [phone]   Stated doctor could be seen before August.

## 2023-06-18 NOTE — Telephone Encounter (Signed)
Can you reroute her referral?

## 2023-06-19 ENCOUNTER — Other Ambulatory Visit: Payer: Self-pay | Admitting: Gastroenterology

## 2023-06-20 DIAGNOSIS — M25512 Pain in left shoulder: Secondary | ICD-10-CM | POA: Insufficient documentation

## 2023-06-21 ENCOUNTER — Telehealth: Payer: Self-pay | Admitting: Gastroenterology

## 2023-06-21 NOTE — Telephone Encounter (Signed)
Inbound call from patient, states she would like a refill of Dexilant. Patient states she has not been seen by [facility], due to cancelling appointments due to being sick with them. She states she has an appointment with them next month but would like medication prior to.

## 2023-06-21 NOTE — Telephone Encounter (Signed)
Refills of Dexilant sent to pharmacy

## 2023-06-21 NOTE — Telephone Encounter (Signed)
I have reached out to Patient as to where she would like Referral sent to. Per current Neurology Notes they offered her an Appt in April and she declined and an Appt was then offered for 5/5. At this time all Neurology Office's are scheduling months out.

## 2023-06-22 ENCOUNTER — Other Ambulatory Visit (HOSPITAL_COMMUNITY): Payer: Self-pay

## 2023-06-22 ENCOUNTER — Other Ambulatory Visit: Payer: Self-pay

## 2023-06-22 DIAGNOSIS — Z794 Long term (current) use of insulin: Secondary | ICD-10-CM

## 2023-06-22 MED ORDER — FREESTYLE LIBRE 3 SENSOR MISC: 99 refills | Status: AC

## 2023-06-22 NOTE — Telephone Encounter (Signed)
Refill sent to pharmacy. LS

## 2023-06-22 NOTE — Progress Notes (Signed)
 Specialty Pharmacy Ongoing Clinical Assessment Note  Judith [name] is a 53 y.o. female who is being followed by the specialty pharmacy service for RxSp Asthma/COPD   [name] specialty medication(s) reviewed today: Mepolizumab  (Nucala )   Missed doses in the last 4 weeks: 0   Patient/Caregiver did not have any additional questions or concerns.   Therapeutic benefit summary: Patient is achieving benefit   Adverse events/side effects summary: No adverse events/side effects   [name] therapy is appropriate to: Continue    Goals Addressed             This Visit's Progress    Minimize recurrence of flares       Patient is on track. Patient will maintain adherence.  Patient reports that she is well-controlled on therapy with no recent flares.          Follow up:  6 months  Silvano LOISE Dolly Specialty Pharmacist

## 2023-06-22 NOTE — Telephone Encounter (Unsigned)
 Copied from CRM [id number]. Topic: Clinical - Prescription Issue >> Jun 22, 2023  9:50 AM Bascom RAMAN wrote: Reason for CRM: Darice with [facility] needs a prescription for Continuous Glucose Sensor (FREESTYLE LIBRE 3 SENSOR) MISC with diagnosis code on it. Callback number is [phone]

## 2023-06-22 NOTE — Progress Notes (Signed)
 Specialty Pharmacy Refill Coordination Note  Judith [name] is a 53 y.o. female contacted today regarding refills of specialty medication(s) Mepolizumab  (Nucala )   Patient requested Delivery   Delivery date: 07/02/23   Verified address: [address] [place] [address]   Medication will be filled on 07/01/23.

## 2023-06-23 ENCOUNTER — Telehealth: Payer: Self-pay

## 2023-06-23 NOTE — Telephone Encounter (Signed)
 Received notification from [facility]   that Dexilant  is not covered on her formulary but patient has been given a temporary supply of medication.  They cover esomeprazole, lansoprazole, omeprazole and pantoprazole . It appears patient has tried and failed pantoprazole  in the past.  Called and spoke to patient. She said she has also tried esomeprazole, omeprazole and lansoprazole. She indicates she can use Good Rx and Dexilant  will cost her $84, which she would prefer not to have to do but she may have to if we can't get it approved.

## 2023-06-30 ENCOUNTER — Telehealth: Payer: Self-pay

## 2023-06-30 ENCOUNTER — Encounter: Payer: Self-pay | Admitting: Family Medicine

## 2023-06-30 ENCOUNTER — Other Ambulatory Visit (HOSPITAL_COMMUNITY): Payer: Self-pay

## 2023-06-30 ENCOUNTER — Telehealth: Payer: Medicare (Managed Care) | Admitting: Family Medicine

## 2023-06-30 ENCOUNTER — Ambulatory Visit: Payer: Medicare Other

## 2023-06-30 DIAGNOSIS — F39 Unspecified mood [affective] disorder: Secondary | ICD-10-CM | POA: Diagnosis not present

## 2023-06-30 DIAGNOSIS — R051 Acute cough: Secondary | ICD-10-CM | POA: Diagnosis not present

## 2023-06-30 DIAGNOSIS — J4540 Moderate persistent asthma, uncomplicated: Secondary | ICD-10-CM | POA: Diagnosis not present

## 2023-06-30 MED ORDER — HYDROXYZINE HCL 10 MG PO TABS: 10.0000 mg | ORAL_TABLET | Freq: Three times a day (TID) | ORAL | 1 refills | Status: AC | PRN

## 2023-06-30 NOTE — Progress Notes (Signed)
MyChart Video visit  Subjective: CC: Asthma PCP: Raliegh Ip, DO HPI:Judith [name] is a 53 y.o. female. Patient provides verbal consent for consult held via video.  Due to COVID-19 pandemic this visit was conducted virtually. This visit type was conducted due to national recommendations for restrictions regarding the COVID-19 Pandemic (e.g. social distancing, sheltering in place) in an effort to limit this patient's exposure and mitigate transmission in our community. All issues noted in this document were discussed and addressed.  A physical exam was not performed with this format.   Location of patient: Home Location of provider: [facility] Others present for call: None  1.  Asthma Patient is followed by pulmonology regularly.  She has moderate persistent asthma and is treated with Nucala.  Has albuterol inhaler on board for as needed use but notes that she does not get as much efficacy from this as she does her nebulizer.  Unfortunately her nebulizer machine died and she needs replacement.  She has been utilizing it daily for the last week and 1/2 to 2 weeks because she has had a cough.  She thinks it is just a viral URI but she does report some right sided rib pain that radiates to the back.  No hemoptysis.  No measured fevers.   ROS: Per HPI  Allergies  Allergen Reactions   Diphenhydramine Other (See Comments)    Hair feels like it is crawling   Keflex [Cephalexin] Hives   Morphine Nausea And Vomiting    PCA PUMP/ DRIP -- N/V IV PUSH IN ER NO PROBLEM PER PT.     Prednisone Nausea And Vomiting   Adhesive [Tape]     Paper tape causes irritation    Erythromycin Hives   Latex    Metformin And Related     Diarrhea with IR and XR products   Sulfonamide Derivatives Other (See Comments)    Does not take due to family history   Past Medical History:  Diagnosis Date   ADD (attention deficit disorder) without hyperactivity    Allergy    Anxiety    Arthritis    Asthma    Bipolar  1 disorder (HCC)    Blood transfusion without reported diagnosis    with child birth 16 yrs ago    Bronchitis, chronic (HCC)    Constipation    COVID-19 virus infection 06/21/2020   DDD (degenerative disc disease), cervical    Depression    Diabetes mellitus without complication (HCC)    Dysphagia    Family history of adverse reaction to anesthesia    mother had a hard time waking up after knee surgery in 2021   Gastroparesis    GERD (gastroesophageal reflux disease)    History of kidney stones    History of MRSA infection    Hyperlipemia    Hypothyroid    Migraines    Pneumonia    Sleep apnea    no cpap     Current Outpatient Medications:    Accu-Chek FastClix Lancets MISC, Test blood sugars daily Dx E11.9, Disp: 102 each, Rfl: 3   ACCU-CHEK GUIDE test strip, TEST BLOOD SUGARS DAILY DX E11.9, Disp: 100 strip, Rfl: 3   albuterol (PROVENTIL) (2.5 MG/3ML) 0.083% nebulizer solution, Take 3 mLs (2.5 mg total) by nebulization every 6 (six) hours as needed for wheezing or shortness of breath., Disp: 150 mL, Rfl: 1   albuterol (VENTOLIN HFA) 108 (90 Base) MCG/ACT inhaler, Inhale 2 puffs into the lungs every 6 (six) hours  as needed for wheezing or shortness of breath (Cough)., Disp: 18 g, Rfl: 2   amphetamine-dextroamphetamine (ADDERALL XR) 20 MG 24 hr capsule, Take 2 capsules (40 mg total) by mouth daily., Disp: 60 capsule, Rfl: 0   [START ON 07/06/2023] amphetamine-dextroamphetamine (ADDERALL XR) 20 MG 24 hr capsule, Take 2 capsules (40 mg total) by mouth daily., Disp: 60 capsule, Rfl: 0   [START ON 08/03/2023] amphetamine-dextroamphetamine (ADDERALL XR) 20 MG 24 hr capsule, Take 2 capsules (40 mg total) by mouth in the morning., Disp: 60 capsule, Rfl: 0   azelastine (ASTELIN) 0.1 % nasal spray, Place 1 spray into both nostrils 2 (two) times daily., Disp: 30 mL, Rfl: 12   Blood Glucose Monitoring Suppl (ACCU-CHEK GUIDE ME) w/Device KIT, Use to check BS BID and as needed. DX: E11.9, Disp: 1  kit, Rfl: 0   cetirizine (ZYRTEC) 10 MG tablet, Take 1 tablet (10 mg total) by mouth daily as needed for allergies., Disp: 90 tablet, Rfl: 3   Continuous Glucose Sensor (FREESTYLE LIBRE 3 SENSOR) MISC, Use to check blood glucose continously, change sensor every 14 days, Disp: 2 each, Rfl: PRN   cyclobenzaprine (FLEXERIL) 10 MG tablet, Take 1 tablet (10 mg total) by mouth 3 (three) times daily as needed for muscle spasms., Disp: 30 tablet, Rfl: 1   dexlansoprazole (DEXILANT) 60 MG capsule, TAKE 1 CAPSULE BY MOUTH EVERY DAY, Disp: 90 capsule, Rfl: 1   diclofenac Sodium (VOLTAREN) 1 % GEL, Apply 2 g topically 4 (four) times daily., Disp: 400 g, Rfl: PRN   dicyclomine (BENTYL) 20 MG tablet, Take 1 tablet (20 mg) by mouth every 6 hours as needed for spasms (abdominal cramps)., Disp: 120 tablet, Rfl: 2   EPINEPHrine 0.3 mg/0.3 mL IJ SOAJ injection, Inject 0.3 mg into the muscle as needed for anaphylaxis., Disp: 1 each, Rfl: 2   FLUoxetine (PROZAC) 40 MG capsule, Take 1 capsule (40 mg total) by mouth daily. Further fills per psychiatry, Disp: 90 capsule, Rfl: 0   gabapentin (NEURONTIN) 300 MG capsule, TAKE 1 CAPSULE BY MOUTH THREE TIMES A DAY, Disp: 270 capsule, Rfl: 3   hydrOXYzine (ATARAX) 10 MG tablet, Take 1 tablet (10 mg total) by mouth 3 (three) times daily as needed., Disp: 30 tablet, Rfl: 0   insulin degludec (TRESIBA) 100 UNIT/ML FlexTouch Pen, Inject 50-60 Units into the skin daily., Disp: 18 mL, Rfl: 1   Insulin Pen Needle (B-D UF III MINI PEN NEEDLES) 31G X 5 MM MISC, use as directed once daily Dx E11.8, Disp: 100 each, Rfl: 4   levothyroxine (SYNTHROID) 125 MCG tablet, Take 1 tablet (125 mcg total) by mouth daily., Disp: 90 tablet, Rfl: 3   linaclotide (LINZESS) 72 MCG capsule, Take 1 capsule (72 mcg total) by mouth daily before breakfast., Disp: 30 capsule, Rfl: 3   medroxyPROGESTERone (DEPO-PROVERA) 150 MG/ML injection, INJECT 1 ML (150 MG TOTAL) INTO THE MUSCLE EVERY 3 (THREE) MONTHS, Disp: 1  mL, Rfl: 2   Melatonin 2.5 MG CHEW, Chew by mouth., Disp: , Rfl:    Mepolizumab (NUCALA) 100 MG/ML SOAJ, Inject 1 mL (100 mg total) into the skin every 28 (twenty-eight) days., Disp: 3 mL, Rfl: 6   montelukast (SINGULAIR) 10 MG tablet, TAKE 1 TABLET BY MOUTH EVERYDAY AT BEDTIME, Disp: 90 tablet, Rfl: 1   nystatin cream (MYCOSTATIN), Apply 1 Application topically 2 (two) times daily. x2 weeks, Disp: 60 g, Rfl: 0   ondansetron (ZOFRAN-ODT) 4 MG disintegrating tablet, Take 1 tablet (4 mg total) by  mouth every 8 (eight) hours as needed for nausea or vomiting., Disp: 12 tablet, Rfl: 0   pioglitazone (ACTOS) 45 MG tablet, Take 1 tablet (45 mg total) by mouth daily., Disp: 90 tablet, Rfl: 3   Rimegepant Sulfate (NURTEC) 75 MG TBDP, Dissolve 1 tablet in mouth at onset of migraine headache. MAX 1 tablet per 24 hours., Disp: 10 tablet, Rfl: PRN   rosuvastatin (CRESTOR) 40 MG tablet, Take 1 tablet (40 mg total) by mouth at bedtime., Disp: 90 tablet, Rfl: 3   tirzepatide (MOUNJARO) 15 MG/0.5ML Pen, Inject 15 mg into the skin once a week., Disp: 6 mL, Rfl: 3  Current Facility-Administered Medications:    medroxyPROGESTERone (DEPO-PROVERA) injection 150 mg, 150 mg, Intramuscular, Q90 days, Dareld Mcauliffe M, DO, 150 mg at 06/09/23 1210  General: Nontoxic-appearing obese female in no acute distress Pulm: Normal work of breathing on room air.  No observed coughing or dyspnea with speech.  No observed wheezing.  Assessment/ Plan: 53 y.o. female   Moderate persistent asthma without complication - Plan: For home use only DME Nebulizer machine  Episodic mood disorder (HCC) - Plan: hydrOXYzine (ATARAX) 10 MG tablet  Acute cough - Plan: DG Chest 2 View  Currently managed by pulmonology with Nucala.  Having some breakthrough symptoms with acute respiratory illnesses and so we will try and get DME for nebulizer machine again.  Rx given to [facility]  Atarax renewed  Chest x-ray ordered given some associated right  rib pain with cough.  Rule out pneumonia  Start time: 10:00a End time: 10:12a  Total time spent on patient care (including video visit/ documentation): 15 minutes  Borden Thune Hulen Skains, DO [facility] [phone]

## 2023-06-30 NOTE — Telephone Encounter (Signed)
Pharmacy Patient Advocate Encounter  Insurance verification completed.   The patient is insured through [employer] test claim for dexlansoprazole (DEXILANT) 60 MG capsule . Currently a quantity of 90 is a 90 day supply and the co-pay is refill too soon per test claim it was filled via retail 06/21/2023 next fill available 07/13/2023. No prior auth needed at this time.   This test claim was processed through [facility]- copay amounts may vary at other pharmacies due to pharmacy/plan contracts, or as the patient moves through the different stages of their insurance plan.

## 2023-06-30 NOTE — Telephone Encounter (Signed)
Called patient and left detailed message regarding Dexilant. Does not require PA, it is not covered on her formulary. She can continue to use GoodRx as needed

## 2023-06-30 NOTE — Telephone Encounter (Signed)
I ran a test claim for patient's dexilant, and it stated medication was "last billed via retail 06/21/2023 and the next refill is available 07/13/2023" it does not require a prior auth currently.

## 2023-07-01 ENCOUNTER — Other Ambulatory Visit (HOSPITAL_COMMUNITY): Payer: Self-pay

## 2023-07-06 ENCOUNTER — Other Ambulatory Visit: Payer: Self-pay | Admitting: Family Medicine

## 2023-07-06 ENCOUNTER — Telehealth: Payer: Self-pay | Admitting: Family Medicine

## 2023-07-06 DIAGNOSIS — J4540 Moderate persistent asthma, uncomplicated: Secondary | ICD-10-CM

## 2023-07-06 NOTE — Telephone Encounter (Signed)
Aware ppw still on PCP's desk

## 2023-07-06 NOTE — Telephone Encounter (Signed)
Copied from CRM [id number]. Topic: Referral - Status >> Jul 06, 2023  3:44 PM Antwanette L wrote: Reason for CRM: Ann from [place] called in about a referral for a nebulizer. The original paperwork was faxed on 1/27 but the office notes on the form has a date of 2/3 on them. The rep resent the forms on 2/12 to have an updated date on the forms. Please contact Ann at [phone] with an update.

## 2023-07-07 NOTE — Telephone Encounter (Signed)
Paperwork faxed back

## 2023-07-08 DIAGNOSIS — M7542 Impingement syndrome of left shoulder: Secondary | ICD-10-CM | POA: Diagnosis not present

## 2023-07-09 ENCOUNTER — Other Ambulatory Visit: Payer: Self-pay | Admitting: Family Medicine

## 2023-07-09 DIAGNOSIS — M7542 Impingement syndrome of left shoulder: Secondary | ICD-10-CM | POA: Insufficient documentation

## 2023-07-09 MED ORDER — MOUNJARO 15 MG/0.5ML ~~LOC~~ SOAJ: 15.0000 mg | SUBCUTANEOUS | 3 refills | Status: AC

## 2023-07-09 NOTE — Telephone Encounter (Signed)
Copied from CRM [id number]. Topic: Clinical - Medication Refill >> Jul 09, 2023  3:08 PM Geroge Baseman wrote: Most Recent Primary Care Visit:  Provider: Raliegh Ip  Department: [facility]  Visit Type: MYCHART VIDEO VISIT  Date: 06/30/2023  Medication: tirzepatide Greggory Keen) 15 MG/0.5ML Pen  Has the patient contacted their pharmacy? Yes Pharmacy does not have medication in stock  Is this the correct pharmacy for this prescription? Yes If no, delete pharmacy and type the correct one.  This is the patient's preferred pharmacy:  [id number] [place], [place] - [address] [facility]   Has the prescription been filled recently? No  Is the patient out of the medication? Yes, last one today  Has the patient been seen for an appointment in the last year OR does the patient have an upcoming appointment? No  Can we respond through MyChart? Yes  Agent: Please be advised that Rx refills may take up to 3 business days. We ask that you follow-up with your pharmacy.

## 2023-07-09 NOTE — Telephone Encounter (Signed)
[facility] [address], [place], [place] [address]. [facility] not [facility]. Not sure how the message below got so messed up, my apologies.

## 2023-07-09 NOTE — Telephone Encounter (Signed)
Copied from CRM [id number]. Topic: Clinical - Medication Refill >> Jul 09, 2023  3:08 PM Geroge Baseman wrote: Most Recent Primary Care Visit:  Provider: Raliegh Ip  Department: [facility]  Visit Type: MYCHART VIDEO VISIT  Date: 06/30/2023  Medication: tirzepatide Greggory Keen) 15 MG/0.5ML Pen  Has the patient contacted their pharmacy? Yes Pharmacy does not have medication in stock  Is this the correct pharmacy for this prescription? Yes If no, delete pharmacy and type the correct one.  This is the patient's preferred pharmacy:   [facility] [address], [place], [place] [address]  Has the prescription been filled recently? No  Is the patient out of the medication? Yes, last one today  Has the patient been seen for an appointment in the last year OR does the patient have an upcoming appointment? No  Can we respond through MyChart? Yes  Agent: Please be advised that Rx refills may take up to 3 business days. We ask that you follow-up with your pharmacy.

## 2023-07-12 NOTE — Telephone Encounter (Signed)
 Yes, I know but Raynelle Fanning actually got an override for her, so I'm waiting to see what is going on.  I've already [employer].

## 2023-07-12 NOTE — Telephone Encounter (Signed)
 Hey, can you help? Not sure why this is an issue again

## 2023-07-12 NOTE — Telephone Encounter (Signed)
  VICTOZA 18 MG/3ML SOPN        Changed from: tirzepatide (MOUNJARO) 15 MG/0.5ML Pen    Pharmacy comment: Alternative Requested:NOT ON FORMULARY.   All Pharmacy Suggested Alternatives:  liraglutide (VICTOZA) 18 MG/3ML SOPN Dulaglutide (TRULICITY) 0.75 MG/0.5ML SOAJ liraglutide (VICTOZA) 18 MG/3ML SOPN

## 2023-07-13 ENCOUNTER — Telehealth: Payer: Self-pay | Admitting: Pharmacist

## 2023-07-13 ENCOUNTER — Other Ambulatory Visit (HOSPITAL_COMMUNITY): Payer: Self-pay

## 2023-07-13 ENCOUNTER — Other Ambulatory Visit: Payer: Self-pay | Admitting: Family Medicine

## 2023-07-14 ENCOUNTER — Other Ambulatory Visit: Payer: Self-pay

## 2023-07-14 ENCOUNTER — Encounter: Payer: Self-pay | Admitting: Family Medicine

## 2023-07-15 ENCOUNTER — Other Ambulatory Visit: Payer: Self-pay

## 2023-07-15 ENCOUNTER — Telehealth: Payer: Self-pay

## 2023-07-15 ENCOUNTER — Other Ambulatory Visit (HOSPITAL_COMMUNITY): Payer: Self-pay

## 2023-07-15 ENCOUNTER — Encounter: Payer: Self-pay | Admitting: Neurology

## 2023-07-15 NOTE — Telephone Encounter (Signed)
 Pharmacy Patient Advocate Encounter  Received notification from [employer] that Prior Authorization for [facility] 15mg /0.3ml has been DENIED.  Full denial letter will be uploaded to the media tab. See denial reason below.   PA #/Case ID/Reference #: Not given, full denial attached in media tab

## 2023-07-15 NOTE — Telephone Encounter (Signed)
 I'm not sure what you did to get this through last time but can you please help?

## 2023-07-16 ENCOUNTER — Other Ambulatory Visit (HOSPITAL_COMMUNITY): Payer: Self-pay

## 2023-07-20 DIAGNOSIS — J4540 Moderate persistent asthma, uncomplicated: Secondary | ICD-10-CM | POA: Diagnosis not present

## 2023-07-20 DIAGNOSIS — R051 Acute cough: Secondary | ICD-10-CM | POA: Diagnosis not present

## 2023-07-22 ENCOUNTER — Other Ambulatory Visit (HOSPITAL_COMMUNITY): Payer: Self-pay

## 2023-07-23 ENCOUNTER — Other Ambulatory Visit: Payer: Self-pay

## 2023-07-23 ENCOUNTER — Telehealth: Payer: Self-pay

## 2023-07-23 NOTE — Telephone Encounter (Signed)
 Received fax for prior authorization approval for nebulizer. Approval letter attached to charts.

## 2023-07-23 NOTE — Progress Notes (Signed)
 Specialty Pharmacy Refill Coordination Note  Judith [name] is a 53 y.o. female contacted today regarding refills of specialty medication(s) Mepolizumab [name]   Patient requested (Patient-Rptd) Delivery   Delivery date: (Patient-Rptd) 07/28/26   Verified address: (Patient-Rptd) [address] [place] [address]   Medication will be filled on 03.11.25.

## 2023-07-27 ENCOUNTER — Other Ambulatory Visit: Payer: Self-pay

## 2023-07-28 ENCOUNTER — Encounter: Payer: Self-pay | Admitting: Pharmacist

## 2023-07-28 ENCOUNTER — Other Ambulatory Visit: Payer: Self-pay | Admitting: Family Medicine

## 2023-07-28 ENCOUNTER — Ambulatory Visit: Payer: Medicare Other | Admitting: Family Medicine

## 2023-07-28 NOTE — Telephone Encounter (Signed)
 I finally got hers approved! She has picked up

## 2023-07-30 NOTE — Telephone Encounter (Signed)
 Mounjaro PA resubmitted

## 2023-08-06 ENCOUNTER — Other Ambulatory Visit (HOSPITAL_COMMUNITY): Payer: Self-pay

## 2023-08-06 ENCOUNTER — Telehealth: Payer: Self-pay

## 2023-08-06 NOTE — Telephone Encounter (Signed)
 Pharmacy Patient Advocate Encounter   Received notification from Onbase that prior authorization for NURTEC ODT 75 MG TABLET is required/requested.   Insurance verification completed.   The patient is insured through [employer] .   Per test claim: Medication is  eligible for pharmacy benefits AND WAS FILLED 08/01/2023 A 30 DAY TEMPORARY SUPPLY BUT IN ORDER FOR PLAN TO COVER. SEE LETTER THAT HAS BEEN SCANNED INTO MEDIA OF CHART WITH DETAILS

## 2023-08-17 DIAGNOSIS — J4540 Moderate persistent asthma, uncomplicated: Secondary | ICD-10-CM | POA: Diagnosis not present

## 2023-08-17 DIAGNOSIS — R051 Acute cough: Secondary | ICD-10-CM | POA: Diagnosis not present

## 2023-08-18 ENCOUNTER — Other Ambulatory Visit (HOSPITAL_COMMUNITY): Payer: Self-pay

## 2023-08-19 ENCOUNTER — Other Ambulatory Visit: Payer: Self-pay

## 2023-08-19 NOTE — Progress Notes (Signed)
 Specialty Pharmacy Refill Coordination Note  Judith [name] is a 53 y.o. female contacted today regarding refills of specialty medication(s) Mepolizumab [name]   Patient requested (Patient-Rptd) Delivery   Delivery date: 08/26/23   Verified address: (Patient-Rptd) [address] [place] [address]   Medication will be filled on 08/25/23.

## 2023-08-20 ENCOUNTER — Ambulatory Visit: Payer: Medicare (Managed Care) | Admitting: Family Medicine

## 2023-08-20 VITALS — BP 135/89 | HR 100 | Temp 97.9°F | Ht 66.0 in | Wt 263.0 lb

## 2023-08-20 DIAGNOSIS — G43009 Migraine without aura, not intractable, without status migrainosus: Secondary | ICD-10-CM

## 2023-08-20 DIAGNOSIS — F988 Other specified behavioral and emotional disorders with onset usually occurring in childhood and adolescence: Secondary | ICD-10-CM | POA: Diagnosis not present

## 2023-08-20 DIAGNOSIS — E1169 Type 2 diabetes mellitus with other specified complication: Secondary | ICD-10-CM

## 2023-08-20 DIAGNOSIS — E039 Hypothyroidism, unspecified: Secondary | ICD-10-CM

## 2023-08-20 DIAGNOSIS — E785 Hyperlipidemia, unspecified: Secondary | ICD-10-CM | POA: Diagnosis not present

## 2023-08-20 DIAGNOSIS — Z7985 Long-term (current) use of injectable non-insulin antidiabetic drugs: Secondary | ICD-10-CM

## 2023-08-20 MED ORDER — LEVOTHYROXINE SODIUM 125 MCG PO TABS: 125.0000 ug | ORAL_TABLET | Freq: Every day | ORAL | 3 refills | Status: AC

## 2023-08-20 MED ORDER — AMPHETAMINE-DEXTROAMPHET ER 20 MG PO CP24: 40.0000 mg | ORAL_CAPSULE | Freq: Every morning | ORAL | 0 refills | Status: DC

## 2023-08-20 MED ORDER — NURTEC 75 MG PO TBDP: ORAL_TABLET | ORAL | 99 refills | Status: AC

## 2023-08-20 MED ORDER — DEXLANSOPRAZOLE 60 MG PO CPDR: 60.0000 mg | DELAYED_RELEASE_CAPSULE | Freq: Every day | ORAL | 3 refills | Status: AC

## 2023-08-20 MED ORDER — ROSUVASTATIN CALCIUM 40 MG PO TABS: 40.0000 mg | ORAL_TABLET | Freq: Every day | ORAL | 3 refills | Status: DC

## 2023-08-20 MED ORDER — AMPHETAMINE-DEXTROAMPHET ER 20 MG PO CP24: 40.0000 mg | ORAL_CAPSULE | Freq: Every day | ORAL | 0 refills | Status: DC

## 2023-08-20 MED ORDER — PIOGLITAZONE HCL 45 MG PO TABS: 45.0000 mg | ORAL_TABLET | Freq: Every day | ORAL | 3 refills | Status: DC

## 2023-08-20 NOTE — Progress Notes (Signed)
 Subjective: CC: ADHD follow-up PCP: Raliegh Ip, DO HPI:Judith [name] is a 53 y.o. female presenting to clinic today for:  1.  ADHD follow-up Patient reports stability of ADHD with 40 mg of extended release Adderall daily.  She does report increase stressors surrounding trying to purchase a house, issues with her fianc's pursue old disability, chronic pain etc.  She reports no adverse side effects of the Adderall including excessive stimulation at nighttime etc.  She continues to occasionally drink [facility] but this is down from her several per day  2.  GERD Patient with a history of GERD.  Under the care of Dr. Adela Lank.  She apparently had Dexilant ordered back in February by their office but was told that it was not something that was covered without her failing multiple meds.  She is uncertain if they have submitted the prior authorization or not.  She reminds [place] that she has failed Prevacid, Protonix, omeprazole, Tagamet, Zantac and Pepcid in the past and that Dexilant has been the only medication to work for her.  She had to pay out-of-pocket with a good Rx coupon most recently because it would not go through her insurance   ROS: Per HPI  Allergies  Allergen Reactions   Diphenhydramine Other (See Comments)    Hair feels like it is crawling   Keflex [Cephalexin] Hives   Morphine Nausea And Vomiting    PCA PUMP/ DRIP -- N/V IV PUSH IN ER NO PROBLEM PER PT.     Prednisone Nausea And Vomiting   Adhesive [Tape]     Paper tape causes irritation    Erythromycin Hives   Latex    Metformin And Related     Diarrhea with IR and XR products   Sulfonamide Derivatives Other (See Comments)    Does not take due to family history   Past Medical History:  Diagnosis Date   ADD (attention deficit disorder) without hyperactivity    Allergy    Anxiety    Arthritis    Asthma    Bipolar 1 disorder (HCC)    Blood transfusion without reported diagnosis    with child birth 16  yrs ago    Bronchitis, chronic (HCC)    Constipation    COVID-19 virus infection 06/21/2020   DDD (degenerative disc disease), cervical    Depression    Diabetes mellitus without complication (HCC)    Dysphagia    Family history of adverse reaction to anesthesia    mother had a hard time waking up after knee surgery in 2021   Gastroparesis    GERD (gastroesophageal reflux disease)    History of kidney stones    History of MRSA infection    Hyperlipemia    Hypothyroid    Migraines    Pneumonia    Sleep apnea    no cpap     Current Outpatient Medications:    Accu-Chek FastClix Lancets MISC, Test blood sugars daily Dx E11.9, Disp: 102 each, Rfl: 3   ACCU-CHEK GUIDE test strip, TEST BLOOD SUGARS DAILY DX E11.9, Disp: 100 strip, Rfl: 3   albuterol (PROVENTIL) (2.5 MG/3ML) 0.083% nebulizer solution, Take 3 mLs (2.5 mg total) by nebulization every 6 (six) hours as needed for wheezing or shortness of breath., Disp: 150 mL, Rfl: 1   albuterol (VENTOLIN HFA) 108 (90 Base) MCG/ACT inhaler, Inhale 2 puffs into the lungs every 6 (six) hours as needed for wheezing or shortness of breath (Cough)., Disp: 18 g, Rfl: 2  amphetamine-dextroamphetamine (ADDERALL XR) 20 MG 24 hr capsule, Take 2 capsules (40 mg total) by mouth daily., Disp: 60 capsule, Rfl: 0   amphetamine-dextroamphetamine (ADDERALL XR) 20 MG 24 hr capsule, Take 2 capsules (40 mg total) by mouth daily., Disp: 60 capsule, Rfl: 0   amphetamine-dextroamphetamine (ADDERALL XR) 20 MG 24 hr capsule, Take 2 capsules (40 mg total) by mouth in the morning., Disp: 60 capsule, Rfl: 0   azelastine (ASTELIN) 0.1 % nasal spray, Place 1 spray into both nostrils 2 (two) times daily., Disp: 30 mL, Rfl: 12   Blood Glucose Monitoring Suppl (ACCU-CHEK GUIDE ME) w/Device KIT, Use to check BS BID and as needed. DX: E11.9, Disp: 1 kit, Rfl: 0   cetirizine (ZYRTEC) 10 MG tablet, Take 1 tablet (10 mg total) by mouth daily as needed for allergies., Disp: 90  tablet, Rfl: 3   Continuous Glucose Sensor (FREESTYLE LIBRE 3 SENSOR) MISC, Use to check blood glucose continously, change sensor every 14 days, Disp: 2 each, Rfl: PRN   cyclobenzaprine (FLEXERIL) 10 MG tablet, Take 1 tablet (10 mg total) by mouth 3 (three) times daily as needed for muscle spasms., Disp: 30 tablet, Rfl: 1   dexlansoprazole (DEXILANT) 60 MG capsule, TAKE 1 CAPSULE BY MOUTH EVERY DAY, Disp: 90 capsule, Rfl: 1   diclofenac Sodium (VOLTAREN) 1 % GEL, Apply 2 g topically 4 (four) times daily., Disp: 400 g, Rfl: PRN   dicyclomine (BENTYL) 20 MG tablet, Take 1 tablet (20 mg) by mouth every 6 hours as needed for spasms (abdominal cramps)., Disp: 120 tablet, Rfl: 2   EPINEPHrine 0.3 mg/0.3 mL IJ SOAJ injection, Inject 0.3 mg into the muscle as needed for anaphylaxis., Disp: 1 each, Rfl: 2   FLUoxetine (PROZAC) 40 MG capsule, TAKE 1 CAPSULE (40 MG TOTAL) BY MOUTH DAILY. FURTHER FILLS PER PSYCHIATRY, Disp: 90 capsule, Rfl: 0   gabapentin (NEURONTIN) 300 MG capsule, Take 300 mg by mouth 4 (four) times daily., Disp: , Rfl:    hydrOXYzine (ATARAX) 10 MG tablet, Take 1 tablet (10 mg total) by mouth 3 (three) times daily as needed for anxiety., Disp: 270 tablet, Rfl: 1   insulin degludec (TRESIBA) 100 UNIT/ML FlexTouch Pen, Inject 50-60 Units into the skin daily., Disp: 18 mL, Rfl: 1   Insulin Pen Needle (B-D UF III MINI PEN NEEDLES) 31G X 5 MM MISC, use as directed once daily Dx E11.8, Disp: 100 each, Rfl: 4   levothyroxine (SYNTHROID) 125 MCG tablet, Take 1 tablet (125 mcg total) by mouth daily., Disp: 90 tablet, Rfl: 3   linaclotide (LINZESS) 72 MCG capsule, Take 1 capsule (72 mcg total) by mouth daily before breakfast., Disp: 30 capsule, Rfl: 3   Melatonin 2.5 MG CHEW, Chew by mouth., Disp: , Rfl:    Mepolizumab (NUCALA) 100 MG/ML SOAJ, Inject 1 mL (100 mg total) into the skin every 28 (twenty-eight) days., Disp: 3 mL, Rfl: 6   montelukast (SINGULAIR) 10 MG tablet, TAKE 1 TABLET BY MOUTH  EVERYDAY AT BEDTIME, Disp: 90 tablet, Rfl: 1   nystatin cream (MYCOSTATIN), Apply 1 Application topically 2 (two) times daily. x2 weeks, Disp: 60 g, Rfl: 0   ondansetron (ZOFRAN-ODT) 4 MG disintegrating tablet, Take 1 tablet (4 mg total) by mouth every 8 (eight) hours as needed for nausea or vomiting., Disp: 12 tablet, Rfl: 0   pioglitazone (ACTOS) 45 MG tablet, Take 1 tablet (45 mg total) by mouth daily., Disp: 90 tablet, Rfl: 3   Rimegepant Sulfate (NURTEC) 75 MG TBDP, Dissolve  1 tablet in mouth at onset of migraine headache. MAX 1 tablet per 24 hours., Disp: 10 tablet, Rfl: PRN   rosuvastatin (CRESTOR) 40 MG tablet, Take 1 tablet (40 mg total) by mouth at bedtime., Disp: 90 tablet, Rfl: 3   tirzepatide (MOUNJARO) 15 MG/0.5ML Pen, Inject 15 mg into the skin once a week., Disp: 6 mL, Rfl: 3  Current Facility-Administered Medications:    medroxyPROGESTERone (DEPO-PROVERA) injection 150 mg, 150 mg, Intramuscular, Q90 days, Ayaan Shutes M, DO, 150 mg at 06/09/23 1210 Social History   Socioeconomic History   Marital status: Divorced    Spouse name: Not on file   Number of children: 1   Years of education: Not on file   Highest education level: GED or equivalent  Occupational History   Not on file  Tobacco Use   Smoking status: Former    Current packs/day: 0.00    Average packs/day: 1 pack/day for 5.0 years (5.0 ttl pk-yrs)    Types: Cigarettes    Start date: 04/04/1988    Quit date: 04/04/1993    Years since quitting: 30.3   Smokeless tobacco: Never  Vaping Use   Vaping status: Every Day   Substances: CBD  Substance and Sexual Activity   Alcohol use: No   Drug use: Yes    Types: Marijuana   Sexual activity: Yes    Birth control/protection: Injection  Other Topics Concern   Not on file  Social History Narrative   Not on file   Social Drivers of Health   Financial Resource Strain: Medium Risk (08/20/2023)   Overall Financial Resource Strain (CARDIA)    Difficulty of  Paying Living Expenses: Somewhat hard  Food Insecurity: No Food Insecurity (08/20/2023)   Hunger Vital Sign    Worried About Running Out of Food in the Last Year: Never true    Ran Out of Food in the Last Year: Never true  Transportation Needs: No Transportation Needs (08/20/2023)   PRAPARE - Administrator, Civil Service (Medical): No    Lack of Transportation (Non-Medical): No  Physical Activity: Insufficiently Active (08/20/2023)   Exercise Vital Sign    Days of Exercise per Week: 3 days    Minutes of Exercise per Session: 20 min  Stress: No Stress Concern Present (08/20/2023)   [employer] of Occupational Health - Occupational Stress Questionnaire    Feeling of Stress : Only a little  Recent Concern: Stress - Stress Concern Present (06/02/2023)   [employer] of Occupational Health - Occupational Stress Questionnaire    Feeling of Stress : Rather much  Social Connections: Socially Integrated (08/20/2023)   Social Connection and Isolation Panel [NHANES]    Frequency of Communication with Friends and Family: More than three times a week    Frequency of Social Gatherings with Friends and Family: Twice a week    Attends Religious Services: 1 to 4 times per year    Active Member of [employer] or Organizations: Yes    Attends Banker Meetings: 1 to 4 times per year    Marital Status: Living with partner  Recent Concern: Social Connections - Moderately Isolated (06/02/2023)   Social Connection and Isolation Panel [NHANES]    Frequency of Communication with Friends and Family: More than three times a week    Frequency of Social Gatherings with Friends and Family: Never    Attends Religious Services: Never    Database administrator or Organizations: Yes    Attends Club or  Organization Meetings: Never    Marital Status: Divorced  Catering manager Violence: Not At Risk (06/02/2023)   Humiliation, Afraid, Rape, and Kick questionnaire    Fear of Current or Ex-Partner: No     Emotionally Abused: No    Physically Abused: No    Sexually Abused: No   Family History  Problem Relation Age of Onset   Asthma Mother    Bronchiolitis Mother    Atrial fibrillation Mother    GER disease Mother    Osteoporosis Mother    Osteoarthritis Mother    Dementia Mother        mild , was medically induced   Transient ischemic attack Mother        x 2   Aneurysm Mother        thoriatic   Bipolar disorder Mother    Depression Mother    Hyperlipidemia Mother    Non-Hodgkin's lymphoma Father    Hyperlipidemia Father    Breast cancer Paternal Grandmother    Lung cancer Paternal Grandmother    Mental illness Other    Diabetes Other    Esophageal cancer Other    Colon cancer Neg Hx    Stomach cancer Neg Hx    Pancreatic cancer Neg Hx    Colon polyps Neg Hx     Objective: Office vital signs reviewed. BP 135/89   Pulse 100   Temp 97.9 F (36.6 C)   Ht 5\' 6"  (1.676 m)   Wt 263 lb (119.3 kg)   SpO2 96%   BMI 42.45 kg/m   Physical Examination:  General: Awake, alert, morbidly obese, No acute distress HEENT:  sclera white, MMM Cardio: regular rate and rhythm, S1S2 heard, no murmurs appreciated Pulm: clear to auscultation bilaterally, no wheezes, rhonchi or rales; normal work of breathing on room air Psych: Appears somewhat stressed when talking about home life  Assessment/ Plan: 53 y.o. female   Attention deficit disorder (ADD) without hyperactivity - Plan: amphetamine-dextroamphetamine (ADDERALL XR) 20 MG 24 hr capsule, amphetamine-dextroamphetamine (ADDERALL XR) 20 MG 24 hr capsule, amphetamine-dextroamphetamine (ADDERALL XR) 20 MG 24 hr capsule  Acquired hypothyroidism - Plan: levothyroxine (SYNTHROID) 125 MCG tablet  Diabetes mellitus treated with injections of non-insulin medication (HCC) - Plan: pioglitazone (ACTOS) 45 MG tablet  Migraine without aura and without status migrainosus, not intractable - Plan: Rimegepant Sulfate (NURTEC) 75 MG  TBDP  Hyperlipidemia associated with type 2 diabetes mellitus (HCC) - Plan: rosuvastatin (CRESTOR) 40 MG tablet  ADHD was chronic is stable.  UDS and CSA are up-to-date as per office policy of the national narcotic database reviewed and there were no red flags  Clinically euthymic.  She had labs done last visit so I do not feel the need to repeat these as they were stable.  We did not discuss any of the other items today but needed refills on all meds  Cina Klumpp Hulen Skains, DO [facility] [phone]

## 2023-08-23 ENCOUNTER — Other Ambulatory Visit (HOSPITAL_COMMUNITY): Payer: Self-pay

## 2023-08-23 ENCOUNTER — Other Ambulatory Visit: Payer: Self-pay

## 2023-08-24 ENCOUNTER — Other Ambulatory Visit: Payer: Self-pay

## 2023-08-25 ENCOUNTER — Other Ambulatory Visit: Payer: Self-pay

## 2023-09-14 ENCOUNTER — Other Ambulatory Visit: Payer: Self-pay

## 2023-09-14 ENCOUNTER — Other Ambulatory Visit: Payer: Self-pay | Admitting: Pharmacy Technician

## 2023-09-14 NOTE — Progress Notes (Signed)
 Specialty Pharmacy Refill Coordination Note  Judith [name] is a 53 y.o. female contacted today regarding refills of specialty medication(s) Mepolizumab  (Nucala )   Patient requested (Patient-Rptd) Delivery   Delivery date: (Patient-Rptd) 09/23/23   Verified address: (Patient-Rptd) [address] [place] [address]   Medication will be filled on 09/22/23.

## 2023-09-16 DIAGNOSIS — R051 Acute cough: Secondary | ICD-10-CM | POA: Diagnosis not present

## 2023-09-16 DIAGNOSIS — J4540 Moderate persistent asthma, uncomplicated: Secondary | ICD-10-CM | POA: Diagnosis not present

## 2023-09-19 DIAGNOSIS — M7542 Impingement syndrome of left shoulder: Secondary | ICD-10-CM | POA: Diagnosis not present

## 2023-09-22 ENCOUNTER — Telehealth: Payer: Self-pay | Admitting: Pharmacist

## 2023-09-22 ENCOUNTER — Other Ambulatory Visit (HOSPITAL_COMMUNITY): Payer: Self-pay

## 2023-09-22 ENCOUNTER — Telehealth: Payer: Self-pay

## 2023-09-22 ENCOUNTER — Other Ambulatory Visit: Payer: Self-pay

## 2023-09-22 NOTE — Telephone Encounter (Signed)
 Submitted an URGENT Prior Authorization request to [employer] for NUCALA  via CoverMyMeds. Will update once we receive a response.  Judith [name]

## 2023-09-22 NOTE — Telephone Encounter (Signed)
-----   Message from [place] S sent at 09/22/2023  8:49 AM EDT ----- Regarding: RE: Due for f/u appt ATC patient, no one answered so I left a voicemail. ----- Message ----- From: Thais Fill, RPH-CPP Sent: 09/22/2023   8:30 AM EDT To: [facility] Admin Subject: Due for f/u appt                               Please call pt to schedule f/u appt w Dr. Marygrace Snellen

## 2023-09-22 NOTE — Progress Notes (Signed)
 PA approed. Please reprocess claim

## 2023-09-22 NOTE — Progress Notes (Signed)
 Needs PA. Routed to Rheum/Pulm.

## 2023-09-22 NOTE — Telephone Encounter (Signed)
 Received notification from [employer] regarding a prior authorization for NUCALA . Authorization has been APPROVED from 08/23/2023 to 09/21/2024.   Patient can continue to fill through [facility]: [phone]   Authorization # [id number]  Geraldene Kleine, PharmD, MPH, BCPS, CPP Clinical Pharmacist (Rheumatology and Pulmonology)

## 2023-09-27 ENCOUNTER — Telehealth: Payer: Medicare (Managed Care) | Admitting: Pulmonary Disease

## 2023-09-27 ENCOUNTER — Encounter: Payer: Self-pay | Admitting: Pulmonary Disease

## 2023-09-27 DIAGNOSIS — J4540 Moderate persistent asthma, uncomplicated: Secondary | ICD-10-CM

## 2023-09-27 DIAGNOSIS — J45909 Unspecified asthma, uncomplicated: Secondary | ICD-10-CM | POA: Diagnosis not present

## 2023-09-27 NOTE — Progress Notes (Signed)
 Virtual Visit via Video Note  I connected with Judith [name] on 09/27/23 at 11:00 AM EDT by a video enabled telemedicine application and verified that I am speaking with the correct person using two identifiers.  Location: Patient: Home Provider: Office - [facility] - [address] [place], [address], [place], [place] [address]  I discussed the limitations of evaluation and management by telemedicine and the availability of in person appointments. The patient expressed understanding and agreed to proceed. I also discussed with the patient that there may be a patient responsible charge related to this service. The patient expressed understanding and agreed to proceed.  Patient consented to consult via telephone: Yes People present and their role in pt care: Pt   History of Present Illness:  52 y.o. woman with history of poorly controlled asthma now with much better control on Nucala  whom we are seeing for routine follow-up. Most recent PCP note x 2 reviewed.   Doing well. No issues. Not really using inhalers.  Feels like 2 exacerbations over the spring and winter.  Prior to Undergoing COVID.  Was Not Wheezing.  Needed to Use Nebulizer for Little Bit.  Otherwise doing fine.  She is quite pleased with her asthma control.  Especially compared to pre-Nucala .  Chief complaint:   asthma  Observations/Objective:  General: Sitting up, in no acute distress Pulmonary: Speaking in full sentences, voice is raspy  Social History   Tobacco Use  Smoking Status Former   Current packs/day: 0.00   Average packs/day: 1 pack/day for 5.0 years (5.0 ttl pk-yrs)   Types: Cigarettes   Start date: 04/04/1988   Quit date: 04/04/1993   Years since quitting: 30.5  Smokeless Tobacco Never   Immunization History  Administered Date(s) Administered   Influenza Inj Mdck Quad Pf 01/21/2021   Influenza, Seasonal, Injecte, Preservative Fre 02/09/2018   Influenza,inj,Quad PF,6+ Mos 02/14/2017, 02/20/2019,  04/23/2023   Influenza-Unspecified 02/05/2020, 01/21/2021   MMR [birth year]/1971   Moderna SARS-COV2 Booster Vaccination 06/05/2020   Moderna Sars-Covid-2 Vaccination 10/02/2019, 11/01/2019, 04/18/2020, 11/26/2020   PNEUMOCOCCAL CONJUGATE-20 11/26/2020   Pneumococcal Polysaccharide-23 02/20/2019, 11/01/2019   Td 03/03/1985, 06/30/1994   Td (Adult), 2 Lf Tetanus Toxid, Preservative Free 03/03/1985, 06/30/1994   Tdap 12/24/2014, 08/15/2019, 11/26/2020   Zoster Recombinant(Shingrix ) 02/05/2020, 04/18/2020, 11/10/2022    Assessment and Plan:  Asthma: Previously persistent severe with multiple exacerbations a year. Started Nucala  10/2020. 2 exacerbations in interim since last visit.  Had 6-7 exacerbations in year preceding starting Nucala . Symptoms of cough and dyspnea improved.  To continue every 28 day Nucala  shots.  Continue rescue inhaler, DuoNeb use as needed.  She finds nebulizers to be more effective.  Chronic cough: Greatly improved after Nucala  initiation.  Follow Up Instructions:  Return in about 6 months (around 03/29/2024) for f/u Dr. Marygrace Snellen in person.    I discussed the assessment and treatment plan with the patient. The patient was provided an opportunity to ask questions and all were answered. The patient agreed with the plan and demonstrated an understanding of the instructions.   The patient was advised to call back or seek an in-person evaluation if the symptoms worsen or if the condition fails to improve as anticipated.   Guerry Leek, MD

## 2023-09-27 NOTE — Patient Instructions (Signed)
 No changes to medications

## 2023-09-28 DIAGNOSIS — M19019 Primary osteoarthritis, unspecified shoulder: Secondary | ICD-10-CM | POA: Insufficient documentation

## 2023-09-28 DIAGNOSIS — M75112 Incomplete rotator cuff tear or rupture of left shoulder, not specified as traumatic: Secondary | ICD-10-CM | POA: Diagnosis not present

## 2023-09-28 DIAGNOSIS — M75110 Incomplete rotator cuff tear or rupture of unspecified shoulder, not specified as traumatic: Secondary | ICD-10-CM | POA: Insufficient documentation

## 2023-09-28 DIAGNOSIS — M19012 Primary osteoarthritis, left shoulder: Secondary | ICD-10-CM | POA: Diagnosis not present

## 2023-10-14 ENCOUNTER — Other Ambulatory Visit: Payer: Self-pay | Admitting: Family Medicine

## 2023-10-15 ENCOUNTER — Other Ambulatory Visit: Payer: Self-pay

## 2023-10-15 DIAGNOSIS — E119 Type 2 diabetes mellitus without complications: Secondary | ICD-10-CM | POA: Diagnosis not present

## 2023-10-15 DIAGNOSIS — H52223 Regular astigmatism, bilateral: Secondary | ICD-10-CM | POA: Diagnosis not present

## 2023-10-15 DIAGNOSIS — H5213 Myopia, bilateral: Secondary | ICD-10-CM | POA: Diagnosis not present

## 2023-10-15 MED ORDER — FLUOXETINE HCL 40 MG PO CAPS: 40.0000 mg | ORAL_CAPSULE | Freq: Every day | ORAL | 0 refills | Status: AC

## 2023-10-15 NOTE — Telephone Encounter (Signed)
 Just found psychiatrist, appt is not till 11/02/23 Please advise on RF for Fluoxetine 

## 2023-10-15 NOTE — Telephone Encounter (Signed)
 As per last SIG: TAKE 1 CAPSULE (40 MG TOTAL) BY MOUTH DAILY. FURTHER FILLS PER PSYCHIATRY

## 2023-10-15 NOTE — Addendum Note (Signed)
 Addended by: Eliodoro Guerin on: 10/15/2023 08:58 AM   Modules accepted: Orders

## 2023-10-17 DIAGNOSIS — R051 Acute cough: Secondary | ICD-10-CM | POA: Diagnosis not present

## 2023-10-17 DIAGNOSIS — J4540 Moderate persistent asthma, uncomplicated: Secondary | ICD-10-CM | POA: Diagnosis not present

## 2023-10-18 ENCOUNTER — Encounter: Payer: Medicare (Managed Care) | Admitting: Family Medicine

## 2023-10-18 ENCOUNTER — Telehealth: Payer: Self-pay

## 2023-10-18 ENCOUNTER — Other Ambulatory Visit (HOSPITAL_COMMUNITY): Payer: Self-pay

## 2023-10-18 NOTE — Progress Notes (Deleted)
 Judith [name] is a 53 y.o. female presents to office today for annual physical exam examination.    Concerns today include: 1. ***  Occupation: ***, Marital status: ***, Substance use: *** Health Maintenance Due  Topic Date Due   Medicare Annual Wellness (AWV)  Never done   COVID-19 Vaccine (5 - 2024-25 season) 01/17/2023   OPHTHALMOLOGY EXAM  09/17/2023   Refills needed today: ***  Immunization History  Administered Date(s) Administered   Influenza Inj Mdck Quad Pf 01/21/2021   Influenza, Seasonal, Injecte, Preservative Fre 02/09/2018   Influenza,inj,Quad PF,6+ Mos 02/14/2017, 02/20/2019, 04/23/2023   Influenza-Unspecified 02/05/2020, 01/21/2021   MMR 02/03/1972   Moderna SARS-COV2 Booster Vaccination 06/05/2020   Moderna Sars-Covid-2 Vaccination 10/02/2019, 11/01/2019, 04/18/2020, 11/26/2020   PNEUMOCOCCAL CONJUGATE-20 11/26/2020   Pneumococcal Polysaccharide-23 02/20/2019, 11/01/2019   Td 03/03/1985, 06/30/1994   Td (Adult), 2 Lf Tetanus Toxid, Preservative Free 03/03/1985, 06/30/1994   Tdap 12/24/2014, 08/15/2019, 11/26/2020   Zoster Recombinant(Shingrix ) 02/05/2020, 04/18/2020, 11/10/2022   Past Medical History:  Diagnosis Date   ADD (attention deficit disorder) without hyperactivity    Allergy    Anxiety    Arthritis    Asthma    Bipolar 1 disorder (HCC)    Blood transfusion without reported diagnosis    with child birth 16 yrs ago    Bronchitis, chronic (HCC)    Constipation    COVID-19 virus infection 06/21/2020   DDD (degenerative disc disease), cervical    Depression    Diabetes mellitus without complication (HCC)    Dysphagia    Family history of adverse reaction to anesthesia    mother had a hard time waking up after knee surgery in 2021   Gastroparesis    GERD (gastroesophageal reflux disease)    History of kidney stones    History of MRSA infection    Hyperlipemia    Hypothyroid    Migraines    Pneumonia    Sleep apnea    no cpap    Social  History   Socioeconomic History   Marital status: Divorced    Spouse name: Not on file   Number of children: 1   Years of education: Not on file   Highest education level: GED or equivalent  Occupational History   Not on file  Tobacco Use   Smoking status: Former    Current packs/day: 0.00    Average packs/day: 1 pack/day for 5.0 years (5.0 ttl pk-yrs)    Types: Cigarettes    Start date: 04/04/1988    Quit date: 04/04/1993    Years since quitting: 30.5   Smokeless tobacco: Never  Vaping Use   Vaping status: Every Day   Substances: CBD  Substance and Sexual Activity   Alcohol use: No   Drug use: Yes    Types: Marijuana   Sexual activity: Yes    Birth control/protection: Injection  Other Topics Concern   Not on file  Social History Narrative   Not on file   Social Drivers of Health   Financial Resource Strain: Medium Risk (08/20/2023)   Overall Financial Resource Strain (CARDIA)    Difficulty of Paying Living Expenses: Somewhat hard  Food Insecurity: No Food Insecurity (08/20/2023)   Hunger Vital Sign    Worried About Running Out of Food in the Last Year: Never true    Ran Out of Food in the Last Year: Never true  Transportation Needs: No Transportation Needs (08/20/2023)   PRAPARE - Transportation    Lack of Transportation (  Medical): No    Lack of Transportation (Non-Medical): No  Physical Activity: Insufficiently Active (08/20/2023)   Exercise Vital Sign    Days of Exercise per Week: 3 days    Minutes of Exercise per Session: 20 min  Stress: No Stress Concern Present (08/20/2023)   [employer] of Occupational Health - Occupational Stress Questionnaire    Feeling of Stress : Only a little  Recent Concern: Stress - Stress Concern Present (06/02/2023)   [employer] of Occupational Health - Occupational Stress Questionnaire    Feeling of Stress : Rather much  Social Connections: Socially Integrated (08/20/2023)   Social Connection and Isolation Panel [NHANES]     Frequency of Communication with Friends and Family: More than three times a week    Frequency of Social Gatherings with Friends and Family: Twice a week    Attends Religious Services: 1 to 4 times per year    Active Member of [employer] or Organizations: Yes    Attends Banker Meetings: 1 to 4 times per year    Marital Status: Living with partner  Recent Concern: Social Connections - Moderately Isolated (06/02/2023)   Social Connection and Isolation Panel [NHANES]    Frequency of Communication with Friends and Family: More than three times a week    Frequency of Social Gatherings with Friends and Family: Never    Attends Religious Services: Never    Database administrator or Organizations: Yes    Attends Banker Meetings: Never    Marital Status: Divorced  Catering manager Violence: Not At Risk (06/02/2023)   Humiliation, Afraid, Rape, and Kick questionnaire    Fear of Current or Ex-Partner: No    Emotionally Abused: No    Physically Abused: No    Sexually Abused: No   Past Surgical History:  Procedure Laterality Date   ABDOMINAL SURGERY     ANTERIOR CERVICAL DECOMP/DISCECTOMY FUSION N/A 10/09/2020   Procedure: Anterior Cervical Discectomy Fusion Cervical five-six;  Surgeon: Van Gelinas, MD;  Location: [facility] OR;  Service: Neurosurgery;  Laterality: N/A;   BIOPSY  02/04/2021   Procedure: BIOPSY;  Surgeon: Ace Holder, MD;  Location: [facility];  Service: Gastroenterology;;   BOTOX  INJECTION N/A 02/04/2021   Procedure: BOTOX  INJECTION;  Surgeon: Ace Holder, MD;  Location: [facility];  Service: Gastroenterology;  Laterality: N/A;   CARPAL TUNNEL RELEASE Bilateral 1999   CARPOMETACARPEL SUSPENSION PLASTY Left 08/05/2022   Procedure: Left thumb trapeziectomy and suspensionplasty;  Surgeon: Marilyn Shropshire, MD;  Location: [facility];  Service: Orthopedics;  Laterality: Left;  with regional block  120   CESAREAN SECTION      CHOLECYSTECTOMY     COLONOSCOPY     ESOPHAGEAL MANOMETRY N/A 07/08/2020   Procedure: ESOPHAGEAL MANOMETRY (EM);  Surgeon: Ace Holder, MD;  Location: [facility];  Service: Gastroenterology;  Laterality: N/A;   ESOPHAGOGASTRODUODENOSCOPY (EGD) WITH PROPOFOL  N/A 02/04/2021   Procedure: ESOPHAGOGASTRODUODENOSCOPY (EGD) WITH PROPOFOL ;  Surgeon: Ace Holder, MD;  Location: [facility];  Service: Gastroenterology;  Laterality: N/A;   REPLACEMENT TOTAL KNEE Bilateral    SCLEROTHERAPY  02/04/2021   Procedure: SCLEROTHERAPY;  Surgeon: Ace Holder, MD;  Location: [facility];  Service: Gastroenterology;;   THYROIDECTOMY     TONSILLECTOMY     TONSILLECTOMY AND ADENOIDECTOMY     TRIGGER FINGER RELEASE Left 06/16/2021   Procedure: LEFT THUMB TRIGGER FINGER RELEASE;  Surgeon: Tonita Frater, MD;  Location: [facility];  Service: Orthopedics;  Laterality: Left;   TRIGGER FINGER RELEASE Right 09/29/2021   Procedure: RELEASE TRIGGER FINGER/A-1 PULLEY;  Surgeon: Tonita Frater, MD;  Location: [facility];  Service: Orthopedics;  Laterality: Right;  Right ring, index and thumb trigger finger   UPPER GASTROINTESTINAL ENDOSCOPY     Family History  Problem Relation Age of Onset   Asthma Mother    Bronchiolitis Mother    Atrial fibrillation Mother    GER disease Mother    Osteoporosis Mother    Osteoarthritis Mother    Dementia Mother        mild , was medically induced   Transient ischemic attack Mother        x 2   Aneurysm Mother        thoriatic   Bipolar disorder Mother    Depression Mother    Hyperlipidemia Mother    Non-Hodgkin's lymphoma Father    Hyperlipidemia Father    Breast cancer Paternal Grandmother    Lung cancer Paternal Grandmother    Mental illness Other    Diabetes Other    Esophageal cancer Other    Colon cancer Neg Hx    Stomach cancer Neg Hx    Pancreatic cancer Neg Hx    Colon polyps Neg Hx     Current Outpatient Medications:    Accu-Chek FastClix  Lancets MISC, Test blood sugars daily Dx E11.9, Disp: 102 each, Rfl: 3   ACCU-CHEK GUIDE test strip, TEST BLOOD SUGARS DAILY DX E11.9, Disp: 100 strip, Rfl: 3   albuterol  (PROVENTIL ) (2.5 MG/3ML) 0.083% nebulizer solution, Take 3 mLs (2.5 mg total) by nebulization every 6 (six) hours as needed for wheezing or shortness of breath., Disp: 150 mL, Rfl: 1   albuterol  (VENTOLIN  HFA) 108 (90 Base) MCG/ACT inhaler, Inhale 2 puffs into the lungs every 6 (six) hours as needed for wheezing or shortness of breath (Cough)., Disp: 18 g, Rfl: 2   amphetamine -dextroamphetamine  (ADDERALL XR) 20 MG 24 hr capsule, Take 2 capsules (40 mg total) by mouth daily., Disp: 60 capsule, Rfl: 0   amphetamine -dextroamphetamine  (ADDERALL XR) 20 MG 24 hr capsule, Take 2 capsules (40 mg total) by mouth daily., Disp: 60 capsule, Rfl: 0   [START ON 11/11/2023] amphetamine -dextroamphetamine  (ADDERALL XR) 20 MG 24 hr capsule, Take 2 capsules (40 mg total) by mouth in the morning., Disp: 60 capsule, Rfl: 0   azelastine  (ASTELIN ) 0.1 % nasal spray, Place 1 spray into both nostrils 2 (two) times daily., Disp: 30 mL, Rfl: 12   Blood Glucose Monitoring Suppl (ACCU-CHEK GUIDE ME) w/Device KIT, Use to check BS BID and as needed. DX: E11.9, Disp: 1 kit, Rfl: 0   cetirizine  (ZYRTEC ) 10 MG tablet, Take 1 tablet (10 mg total) by mouth daily as needed for allergies., Disp: 90 tablet, Rfl: 3   Continuous Glucose Sensor (FREESTYLE LIBRE 3 SENSOR) MISC, Use to check blood glucose continously, change sensor every 14 days, Disp: 2 each, Rfl: PRN   cyclobenzaprine  (FLEXERIL ) 10 MG tablet, Take 1 tablet (10 mg total) by mouth 3 (three) times daily as needed for muscle spasms., Disp: 30 tablet, Rfl: 1   dexlansoprazole  (DEXILANT ) 60 MG capsule, Take 1 capsule (60 mg total) by mouth daily. Has failed: zantac, pepcid, prilosec, protonix , prevacid and nexium., Disp: 90 capsule, Rfl: 3   diclofenac  Sodium (VOLTAREN ) 1 % GEL, Apply 2 g topically 4 (four) times  daily., Disp: 400 g, Rfl: PRN   dicyclomine  (BENTYL ) 20 MG tablet, Take 1 tablet (20 mg) by mouth  every 6 hours as needed for spasms (abdominal cramps)., Disp: 120 tablet, Rfl: 2   EPINEPHrine  0.3 mg/0.3 mL IJ SOAJ injection, Inject 0.3 mg into the muscle as needed for anaphylaxis., Disp: 1 each, Rfl: 2   FLUoxetine  (PROZAC ) 40 MG capsule, Take 1 capsule (40 mg total) by mouth daily. Further fills per psychiatry, Disp: 30 capsule, Rfl: 0   gabapentin  (NEURONTIN ) 300 MG capsule, Take 300 mg by mouth 4 (four) times daily., Disp: , Rfl:    hydrOXYzine  (ATARAX ) 10 MG tablet, Take 1 tablet (10 mg total) by mouth 3 (three) times daily as needed for anxiety., Disp: 270 tablet, Rfl: 1   insulin  degludec (TRESIBA ) 100 UNIT/ML FlexTouch Pen, Inject 50-60 Units into the skin daily., Disp: 18 mL, Rfl: 1   Insulin  Pen Needle (B-D UF III MINI PEN NEEDLES) 31G X 5 MM MISC, use as directed once daily Dx E11.8, Disp: 100 each, Rfl: 4   levothyroxine  (SYNTHROID ) 125 MCG tablet, Take 1 tablet (125 mcg total) by mouth daily., Disp: 90 tablet, Rfl: 3   linaclotide  (LINZESS ) 72 MCG capsule, Take 1 capsule (72 mcg total) by mouth daily before breakfast., Disp: 30 capsule, Rfl: 3   Mepolizumab  (NUCALA ) 100 MG/ML SOAJ, Inject 1 mL (100 mg total) into the skin every 28 (twenty-eight) days., Disp: 3 mL, Rfl: 6   montelukast  (SINGULAIR ) 10 MG tablet, TAKE 1 TABLET BY MOUTH EVERYDAY AT BEDTIME, Disp: 90 tablet, Rfl: 1   nystatin  cream (MYCOSTATIN ), Apply 1 Application topically 2 (two) times daily. x2 weeks, Disp: 60 g, Rfl: 0   ondansetron  (ZOFRAN -ODT) 4 MG disintegrating tablet, Take 1 tablet (4 mg total) by mouth every 8 (eight) hours as needed for nausea or vomiting., Disp: 12 tablet, Rfl: 0   pioglitazone  (ACTOS ) 45 MG tablet, Take 1 tablet (45 mg total) by mouth daily., Disp: 90 tablet, Rfl: 3   Rimegepant Sulfate (NURTEC) 75 MG TBDP, Dissolve 1 tablet in mouth at onset of migraine headache. MAX 1 tablet per 24 hours.,  Disp: 10 tablet, Rfl: PRN   rosuvastatin  (CRESTOR ) 40 MG tablet, Take 1 tablet (40 mg total) by mouth at bedtime., Disp: 90 tablet, Rfl: 3   tirzepatide  (MOUNJARO ) 15 MG/0.5ML Pen, Inject 15 mg into the skin once a week., Disp: 6 mL, Rfl: 3  Current Facility-Administered Medications:    medroxyPROGESTERone  (DEPO-PROVERA ) injection 150 mg, 150 mg, Intramuscular, Q90 days, Yasmin Bronaugh M, DO, 150 mg at 06/09/23 1210  Allergies  Allergen Reactions   Diphenhydramine  Other (See Comments)    Hair feels like it is crawling   Keflex  [Cephalexin ] Hives   Morphine  Nausea And Vomiting    PCA PUMP/ DRIP -- N/V IV PUSH IN ER NO PROBLEM PER PT.     Prednisone Nausea And Vomiting   Adhesive [Tape]     Paper tape causes irritation    Erythromycin Hives   Latex    Metformin And Related     Diarrhea with IR and XR products   Sulfonamide Derivatives Other (See Comments)    Does not take due to family history     ROS: Review of Systems {ros; complete:30496}    Physical exam {Exam, Complete:[id number]}      08/20/2023   11:07 AM 06/02/2023    1:55 PM 02/22/2023   12:14 PM  Depression screen PHQ 2/9  Decreased Interest 1 1 1   Down, Depressed, Hopeless 0 0 0  PHQ - 2 Score 1 1 1   Altered sleeping 2 3 2   Tired,  decreased energy 3 3 2   Change in appetite 0 0 0  Feeling bad or failure about yourself  0 1 0  Trouble concentrating 0 0 0  Moving slowly or fidgety/restless 0 0 0  Suicidal thoughts 0 0 0  PHQ-9 Score 6 8 5   Difficult doing work/chores Somewhat difficult Somewhat difficult Somewhat difficult      08/20/2023   11:22 AM 06/02/2023    1:54 PM 02/22/2023   12:14 PM 12/07/2022    3:14 PM  GAD 7 : Generalized Anxiety Score  Nervous, Anxious, on Edge 1 1 1 2   Control/stop worrying 0 0 0 1  Worry too much - different things 0 3 0 0  Trouble relaxing 1 3 0 1  Restless 0 0 0 0  Easily annoyed or irritable 2 0 2 1  Afraid - awful might happen 0 1 0 0  Total GAD 7 Score 4 8 3 5    Anxiety Difficulty Somewhat difficult  Somewhat difficult Somewhat difficult     Assessment/ Plan: Shearon Denis here for annual physical exam.   Annual physical exam  Preop examination  Diabetes mellitus treated with injections of non-insulin  medication (HCC)  Hyperlipidemia associated with type 2 diabetes mellitus (HCC)  Hypertension associated with diabetes (HCC)  Acquired hypothyroidism  Moderate persistent asthma without complication  Obesity, morbid, BMI 40.0-49.9 (HCC)  ***  Counseled on healthy lifestyle choices, including diet (rich in fruits, vegetables and lean meats and low in salt and simple carbohydrates) and exercise (at least 30 minutes of moderate physical activity daily).  Patient to follow up ***  Judith Sebree M. Bonnell Butcher, DO

## 2023-10-18 NOTE — Telephone Encounter (Signed)
 Pharmacy Patient Advocate Encounter   Received notification from CoverMyMeds that prior authorization for Nurtec 75 is required/requested.   Insurance verification completed.   The patient is insured through [facility] .   Per test claim: PA required; PA submitted to above mentioned insurance via CoverMyMeds Key/confirmation #/EOC [id number] Status is pending

## 2023-10-20 ENCOUNTER — Other Ambulatory Visit: Payer: Self-pay

## 2023-10-20 NOTE — Progress Notes (Signed)
 Specialty Pharmacy Refill Coordination Note  Judith [name] is a 53 y.o. female contacted today regarding refills of specialty medication(s) Mepolizumab  (Nucala )   Patient requested Delivery   Delivery date: 10/22/23   Verified address: [address]   [place] [address]   Medication will be filled on 10/21/23.

## 2023-10-21 ENCOUNTER — Other Ambulatory Visit: Payer: Self-pay | Admitting: Family Medicine

## 2023-10-21 ENCOUNTER — Other Ambulatory Visit: Payer: Self-pay

## 2023-10-21 ENCOUNTER — Encounter: Payer: Self-pay | Admitting: Family Medicine

## 2023-10-21 DIAGNOSIS — E118 Type 2 diabetes mellitus with unspecified complications: Secondary | ICD-10-CM

## 2023-10-22 NOTE — Telephone Encounter (Signed)
 Pharmacy Patient Advocate Encounter  Received notification from [facility] that Prior Authorization for Nurtec 75 has been DENIED.  Full denial letter will be uploaded to the media tab. See denial reason below.   PA #/Case ID/Reference #: [id number]

## 2023-10-22 NOTE — Telephone Encounter (Signed)
 Pt informed via Mychart. LS

## 2023-10-27 ENCOUNTER — Other Ambulatory Visit (HOSPITAL_COMMUNITY): Payer: Self-pay

## 2023-10-28 ENCOUNTER — Telehealth: Payer: Self-pay

## 2023-10-28 DIAGNOSIS — M47812 Spondylosis without myelopathy or radiculopathy, cervical region: Secondary | ICD-10-CM | POA: Diagnosis not present

## 2023-10-28 DIAGNOSIS — Z79899 Other long term (current) drug therapy: Secondary | ICD-10-CM | POA: Diagnosis not present

## 2023-10-28 DIAGNOSIS — M5416 Radiculopathy, lumbar region: Secondary | ICD-10-CM | POA: Diagnosis not present

## 2023-10-28 DIAGNOSIS — M5412 Radiculopathy, cervical region: Secondary | ICD-10-CM | POA: Diagnosis not present

## 2023-10-28 NOTE — Telephone Encounter (Signed)
 N/a

## 2023-10-29 ENCOUNTER — Telehealth: Payer: Self-pay

## 2023-10-29 ENCOUNTER — Other Ambulatory Visit (HOSPITAL_COMMUNITY): Payer: Self-pay

## 2023-10-29 NOTE — Telephone Encounter (Signed)
 Pt has been notified.

## 2023-10-29 NOTE — Telephone Encounter (Signed)
 PA request has been Approved. New Encounter has been or will be created for follow up. For additional info see Pharmacy Prior Auth telephone encounter from 10/29/23.

## 2023-10-29 NOTE — Telephone Encounter (Signed)
 Pharmacy Patient Advocate Encounter  Received notification from [employer] that Prior Authorization for Nurtec 75MG  dispersible tablets has been APPROVED from 09/29/23 to 10/28/24. Ran test claim, Copay is $0. This test claim was processed through [facility]- copay amounts may vary at other pharmacies due to pharmacy/plan contracts, or as the patient moves through the different stages of their insurance plan.   PA #/Case ID/Reference #: [id number]

## 2023-10-29 NOTE — Telephone Encounter (Signed)
 Pharmacy Patient Advocate Encounter   Received notification from Pt Calls Messages that prior authorization for Nurtec 75MG  dispersible tablets is required/requested.   Insurance verification completed.   The patient is insured through [employer] .   Per test claim: PA required; PA submitted to above mentioned insurance via CoverMyMeds Key/confirmation #/EOC [facility] Status is pending

## 2023-11-02 ENCOUNTER — Other Ambulatory Visit (HOSPITAL_COMMUNITY): Payer: Self-pay

## 2023-11-02 ENCOUNTER — Other Ambulatory Visit: Payer: Self-pay | Admitting: Family Medicine

## 2023-11-02 ENCOUNTER — Telehealth: Payer: Self-pay

## 2023-11-02 DIAGNOSIS — F411 Generalized anxiety disorder: Secondary | ICD-10-CM | POA: Diagnosis not present

## 2023-11-02 DIAGNOSIS — F900 Attention-deficit hyperactivity disorder, predominantly inattentive type: Secondary | ICD-10-CM | POA: Diagnosis not present

## 2023-11-02 DIAGNOSIS — F3112 Bipolar disorder, current episode manic without psychotic features, moderate: Secondary | ICD-10-CM | POA: Diagnosis not present

## 2023-11-02 NOTE — Telephone Encounter (Signed)
 Pharmacy Patient Advocate Encounter   Received notification from CoverMyMeds that prior authorization for Nurtec 75 is required/requested.   Insurance verification completed.   The patient is insured through [employer] .   Per test claim: Refill too soon. PA is not needed at this time. Medication was filled 10/29/23. Next eligible fill date is 11/13/23.  Patient already has PA for this medication

## 2023-11-03 ENCOUNTER — Ambulatory Visit: Payer: Medicare (Managed Care) | Admitting: Family Medicine

## 2023-11-03 ENCOUNTER — Encounter: Payer: Self-pay | Admitting: Family Medicine

## 2023-11-03 ENCOUNTER — Ambulatory Visit: Payer: Self-pay | Admitting: Family Medicine

## 2023-11-03 VITALS — BP 140/94 | HR 80 | Temp 98.3°F | Ht 66.0 in | Wt 272.0 lb

## 2023-11-03 DIAGNOSIS — J4540 Moderate persistent asthma, uncomplicated: Secondary | ICD-10-CM | POA: Diagnosis not present

## 2023-11-03 DIAGNOSIS — E1159 Type 2 diabetes mellitus with other circulatory complications: Secondary | ICD-10-CM

## 2023-11-03 DIAGNOSIS — Z7985 Long-term (current) use of injectable non-insulin antidiabetic drugs: Secondary | ICD-10-CM

## 2023-11-03 DIAGNOSIS — Z01818 Encounter for other preprocedural examination: Secondary | ICD-10-CM | POA: Diagnosis not present

## 2023-11-03 DIAGNOSIS — I152 Hypertension secondary to endocrine disorders: Secondary | ICD-10-CM

## 2023-11-03 DIAGNOSIS — E119 Type 2 diabetes mellitus without complications: Secondary | ICD-10-CM | POA: Diagnosis not present

## 2023-11-03 DIAGNOSIS — E1169 Type 2 diabetes mellitus with other specified complication: Secondary | ICD-10-CM | POA: Diagnosis not present

## 2023-11-03 DIAGNOSIS — E785 Hyperlipidemia, unspecified: Secondary | ICD-10-CM | POA: Diagnosis not present

## 2023-11-03 DIAGNOSIS — M75112 Incomplete rotator cuff tear or rupture of left shoulder, not specified as traumatic: Secondary | ICD-10-CM

## 2023-11-03 DIAGNOSIS — E039 Hypothyroidism, unspecified: Secondary | ICD-10-CM | POA: Diagnosis not present

## 2023-11-03 LAB — BAYER DCA HB A1C WAIVED: HB A1C (BAYER DCA - WAIVED): 7.4 % — ABNORMAL HIGH (ref 4.8–5.6)

## 2023-11-03 NOTE — Progress Notes (Signed)
 Pt is a 53 y.o. female who is here for preoperative clearance for left shoulder rotator cuff repair with [facility].  She has had no issues postop previously and denies any excessive postop nausea, vomiting or difficulty arousing.  She is not sure if they are doing a nerve block or if they plan on doing general anesthesia.  She denies any shortness of breath.  Asthma has been well-controlled.  No hypoglycemic episodes.  She is compliant with all medications as prescribed.  1) High Risk Cardiac Conditions  1) Recent MI - No.  2) Decompensated Heart Failure - No.  3) Unstable angina - No.  4) Symptomatic arrythmia - No.  5) Sx Valvular Disease - No.  2) Intermediate Risk Factors - DM - Yes.    2) Functional Status - > 4 mets (Walk, run, climb stairs) Yes.  Judith [name] Activity Status Index: 5.38  3) Surgery Specific Risk -  Intermediate ( Orthopaedic )      4) Further Noninvasive evaluation -   1) EKG - Yes.     1) Hx of DM  2) Echo - No.   1) Worsening dyspnea   3) Stress Testing - Active Cardiac Disease - No.  5) Need for medical therapy - Beta Blocker, Statins indicated ? No.   Current Outpatient Medications:    Accu-Chek FastClix Lancets MISC, Test blood sugars daily Dx E11.9, Disp: 102 each, Rfl: 3   albuterol  (PROVENTIL ) (2.5 MG/3ML) 0.083% nebulizer solution, Take 3 mLs (2.5 mg total) by nebulization every 6 (six) hours as needed for wheezing or shortness of breath., Disp: 150 mL, Rfl: 1   albuterol  (VENTOLIN  HFA) 108 (90 Base) MCG/ACT inhaler, Inhale 2 puffs into the lungs every 6 (six) hours as needed for wheezing or shortness of breath (Cough)., Disp: 18 g, Rfl: 2   azelastine  (ASTELIN ) 0.1 % nasal spray, Place 1 spray into both nostrils 2 (two) times daily., Disp: 30 mL, Rfl: 12   Blood Glucose Monitoring Suppl (ACCU-CHEK GUIDE ME) w/Device KIT, Use to check BS BID and as needed. DX: E11.9, Disp: 1 kit, Rfl: 0   cetirizine  (ZYRTEC ) 10 MG tablet, Take 1 tablet (10 mg total) by  mouth daily as needed for allergies., Disp: 90 tablet, Rfl: 3   Continuous Glucose Sensor (FREESTYLE LIBRE 3 SENSOR) MISC, Use to check blood glucose continously, change sensor every 14 days, Disp: 2 each, Rfl: PRN   cyclobenzaprine  (FLEXERIL ) 10 MG tablet, Take 1 tablet (10 mg total) by mouth 3 (three) times daily as needed for muscle spasms., Disp: 30 tablet, Rfl: 1   dexlansoprazole  (DEXILANT ) 60 MG capsule, Take 1 capsule (60 mg total) by mouth daily. Has failed: zantac, pepcid, prilosec, protonix , prevacid and nexium., Disp: 90 capsule, Rfl: 3   diazepam (VALIUM) 2 MG tablet, Take 2 mg by mouth daily as needed., Disp: , Rfl:    diclofenac  Sodium (VOLTAREN ) 1 % GEL, Apply 2 g topically 4 (four) times daily., Disp: 400 g, Rfl: PRN   dicyclomine  (BENTYL ) 20 MG tablet, Take 1 tablet (20 mg) by mouth every 6 hours as needed for spasms (abdominal cramps)., Disp: 120 tablet, Rfl: 2   EPINEPHrine  0.3 mg/0.3 mL IJ SOAJ injection, Inject 0.3 mg into the muscle as needed for anaphylaxis., Disp: 1 each, Rfl: 2   FLUoxetine  (PROZAC ) 40 MG capsule, Take 1 capsule (40 mg total) by mouth daily. Further fills per psychiatry, Disp: 30 capsule, Rfl: 0   gabapentin  (NEURONTIN ) 300 MG capsule, Take 300 mg  by mouth 4 (four) times daily., Disp: , Rfl:    glucose blood (ACCU-CHEK GUIDE TEST) test strip, TEST BLOOD SUGARS DAILY DX E11.9, Disp: 100 strip, Rfl: 3   hydrOXYzine  (ATARAX ) 10 MG tablet, Take 1 tablet (10 mg total) by mouth 3 (three) times daily as needed for anxiety., Disp: 270 tablet, Rfl: 1   insulin  degludec (TRESIBA  FLEXTOUCH) 100 UNIT/ML FlexTouch Pen, INJECT 50-60 UNITS INTO THE SKIN DAILY., Disp: 18 mL, Rfl: 0   Insulin  Pen Needle (B-D UF III MINI PEN NEEDLES) 31G X 5 MM MISC, use as directed once daily Dx E11.8, Disp: 100 each, Rfl: 4   levothyroxine  (SYNTHROID ) 125 MCG tablet, Take 1 tablet (125 mcg total) by mouth daily., Disp: 90 tablet, Rfl: 3   linaclotide  (LINZESS ) 72 MCG capsule, Take 1 capsule  (72 mcg total) by mouth daily before breakfast., Disp: 30 capsule, Rfl: 3   lisdexamfetamine (VYVANSE ) 40 MG capsule, Take 40 mg by mouth every morning., Disp: , Rfl:    Mepolizumab  (NUCALA ) 100 MG/ML SOAJ, Inject 1 mL (100 mg total) into the skin every 28 (twenty-eight) days., Disp: 3 mL, Rfl: 6   montelukast  (SINGULAIR ) 10 MG tablet, TAKE 1 TABLET BY MOUTH EVERYDAY AT BEDTIME, Disp: 90 tablet, Rfl: 1   nystatin  cream (MYCOSTATIN ), Apply 1 Application topically 2 (two) times daily. x2 weeks, Disp: 60 g, Rfl: 0   ondansetron  (ZOFRAN -ODT) 4 MG disintegrating tablet, Take 1 tablet (4 mg total) by mouth every 8 (eight) hours as needed for nausea or vomiting., Disp: 12 tablet, Rfl: 0   PERCOCET 10-325 MG tablet, Take 1 tablet by mouth every 8 (eight) hours as needed for pain. Per pain management, Disp: , Rfl:    pioglitazone  (ACTOS ) 45 MG tablet, Take 1 tablet (45 mg total) by mouth daily., Disp: 90 tablet, Rfl: 3   Rimegepant Sulfate (NURTEC) 75 MG TBDP, Dissolve 1 tablet in mouth at onset of migraine headache. MAX 1 tablet per 24 hours., Disp: 10 tablet, Rfl: PRN   rosuvastatin  (CRESTOR ) 40 MG tablet, Take 1 tablet (40 mg total) by mouth at bedtime., Disp: 90 tablet, Rfl: 3   tirzepatide  (MOUNJARO ) 15 MG/0.5ML Pen, Inject 15 mg into the skin once a week., Disp: 6 mL, Rfl: 3  Current Facility-Administered Medications:    medroxyPROGESTERone  (DEPO-PROVERA ) injection 150 mg, 150 mg, Intramuscular, Q90 days, Judith [name] M, DO, 150 mg at 06/09/23 1210  Allergies  Allergen Reactions   Diphenhydramine  Other (See Comments)    Hair feels like it is crawling   Keflex  [Cephalexin ] Hives   Morphine  Nausea And Vomiting    PCA PUMP/ DRIP -- N/V IV PUSH IN ER NO PROBLEM PER PT.     Prednisone Nausea And Vomiting   Adhesive [Tape]     Paper tape causes irritation    Erythromycin Hives   Latex    Metformin And Related     Diarrhea with IR and XR products   Sulfonamide Derivatives Other (See  Comments)    Does not take due to family history   Social History   Socioeconomic History   Marital status: Divorced    Spouse name: Not on file   Number of children: 1   Years of education: Not on file   Highest education level: GED or equivalent  Occupational History   Not on file  Tobacco Use   Smoking status: Former    Current packs/day: 0.00    Average packs/day: 1 pack/day for 5.0 years (5.0 ttl pk-yrs)  Types: Cigarettes    Start date: 04/04/1988    Quit date: 04/04/1993    Years since quitting: 30.6   Smokeless tobacco: Never  Vaping Use   Vaping status: Every Day   Substances: CBD  Substance and Sexual Activity   Alcohol use: No   Drug use: Yes    Types: Marijuana   Sexual activity: Yes    Birth control/protection: Injection  Other Topics Concern   Not on file  Social History Narrative   Not on file   Social Drivers of Health   Financial Resource Strain: Low Risk  (11/02/2023)   Overall Financial Resource Strain (CARDIA)    Difficulty of Paying Living Expenses: Not very hard  Recent Concern: Financial Resource Strain - Medium Risk (08/20/2023)   Overall Financial Resource Strain (CARDIA)    Difficulty of Paying Living Expenses: Somewhat hard  Food Insecurity: No Food Insecurity (11/02/2023)   Hunger Vital Sign    Worried About Running Out of Food in the Last Year: Never true    Ran Out of Food in the Last Year: Never true  Transportation Needs: No Transportation Needs (11/02/2023)   PRAPARE - Administrator, Civil Service (Medical): No    Lack of Transportation (Non-Medical): No  Physical Activity: Inactive (11/02/2023)   Exercise Vital Sign    Days of Exercise per Week: 0 days    Minutes of Exercise per Session: Not on file  Stress: No Stress Concern Present (11/02/2023)   [employer] of Occupational Health - Occupational Stress Questionnaire    Feeling of Stress: Only a little  Social Connections: Moderately Integrated (11/02/2023)    Social Connection and Isolation Panel    Frequency of Communication with Friends and Family: More than three times a week    Frequency of Social Gatherings with Friends and Family: Three times a week    Attends Religious Services: 1 to 4 times per year    Active Member of Clubs or Organizations: No    Attends Engineer, structural: Not on file    Marital Status: Living with partner   PE: Vitals:   11/03/23 1115  BP: (!) 140/94  Pulse: 80  Temp: 98.3 F (36.8 C)  SpO2: 93%   Physical Examination: General appearance - alert, well appearing, and in no distress and morbidly obese Mental status - alert, oriented to person, place, and time Eyes - pupils equal and reactive, extraocular eye movements intact Mouth - mucous membranes moist, pharynx normal without lesions and Mallampati 1.  Upper teeth surgically absent Neck - supple, no significant adenopathy, able to fully extend neck Chest - clear to auscultation, no wheezes, rales or rhonchi, symmetric air entry Heart - normal rate, regular rhythm, normal S1, S2, no murmurs, rubs, clicks or gallops Musculoskeletal -ambulating independently.  Preop examination - Plan: CMP14+EGFR, Protime-INR, CBC with Differential, EKG 12-Lead  Incomplete tear of left rotator cuff, unspecified whether traumatic - Plan: CMP14+EGFR, CBC with Differential, EKG 12-Lead  Diabetes mellitus treated with injections of non-insulin  medication (HCC) - Plan: CMP14+EGFR, CBC with Differential, Bayer DCA Hb A1c Waived, EKG 12-Lead  Hyperlipidemia associated with type 2 diabetes mellitus (HCC) - Plan: CMP14+EGFR, EKG 12-Lead  Hypertension associated with diabetes (HCC) - Plan: CMP14+EGFR, EKG 12-Lead  Moderate persistent asthma without complication - Plan: CMP14+EGFR, EKG 12-Lead, CANCELED: DG Chest 2 View  Acquired hypothyroidism - Plan: TSH + free T4, EKG 12-Lead  EKG without evidence of ischemia and was unchanged from previous.  Labs were drawn  today we  will fax this along with preop clearance form tomorrow  I have independently evaluated patient.  Judith [name] is a 53 y.o. female who is low risk for an intermediate risk surgery.  There are modifiable risk factors ( morbid obesity).  Will need BP recheck. She was in pain during our exam today. Previously controlled BP.  Elese R Novella's RCRI calculation for MACE is: 0.    Chanler Mendonca M. Bonnell Butcher, DO [facility]

## 2023-11-04 LAB — CBC WITH DIFFERENTIAL/PLATELET
Basophils Absolute: 0.1 10*3/uL (ref 0.0–0.2)
Basos: 1 %
EOS (ABSOLUTE): 0 10*3/uL (ref 0.0–0.4)
Eos: 1 %
Hematocrit: 43 % (ref 34.0–46.6)
Hemoglobin: 14 g/dL (ref 11.1–15.9)
Immature Grans (Abs): 0 10*3/uL (ref 0.0–0.1)
Immature Granulocytes: 0 %
Lymphocytes Absolute: 2.7 10*3/uL (ref 0.7–3.1)
Lymphs: 39 %
MCH: 28.5 pg (ref 26.6–33.0)
MCHC: 32.6 g/dL (ref 31.5–35.7)
MCV: 87 fL (ref 79–97)
Monocytes Absolute: 0.6 10*3/uL (ref 0.1–0.9)
Monocytes: 9 %
Neutrophils Absolute: 3.6 10*3/uL (ref 1.4–7.0)
Neutrophils: 50 %
Platelets: 311 10*3/uL (ref 150–450)
RBC: 4.92 x10E6/uL (ref 3.77–5.28)
RDW: 13 % (ref 11.7–15.4)
WBC: 7 10*3/uL (ref 3.4–10.8)

## 2023-11-04 LAB — CMP14+EGFR
ALT: 20 IU/L (ref 0–32)
AST: 17 IU/L (ref 0–40)
Albumin: 3.9 g/dL (ref 3.8–4.9)
Alkaline Phosphatase: 118 IU/L (ref 44–121)
BUN/Creatinine Ratio: 21 (ref 9–23)
BUN: 15 mg/dL (ref 6–24)
Bilirubin Total: 0.3 mg/dL (ref 0.0–1.2)
CO2: 19 mmol/L — ABNORMAL LOW (ref 20–29)
Calcium: 9.1 mg/dL (ref 8.7–10.2)
Chloride: 102 mmol/L (ref 96–106)
Creatinine, Ser: 0.73 mg/dL (ref 0.57–1.00)
Globulin, Total: 2.7 g/dL (ref 1.5–4.5)
Glucose: 141 mg/dL — ABNORMAL HIGH (ref 70–99)
Potassium: 4.1 mmol/L (ref 3.5–5.2)
Sodium: 138 mmol/L (ref 134–144)
Total Protein: 6.6 g/dL (ref 6.0–8.5)
eGFR: 98 mL/min/{1.73_m2} (ref >59)

## 2023-11-04 LAB — PROTIME-INR
INR: 1 (ref 0.9–1.2)
Prothrombin Time: 11.2 s (ref 9.1–12.0)

## 2023-11-04 LAB — TSH+FREE T4
Free T4: 1.32 ng/dL (ref 0.82–1.77)
TSH: 0.101 u[IU]/mL — ABNORMAL LOW (ref 0.450–4.500)

## 2023-11-12 ENCOUNTER — Other Ambulatory Visit (HOSPITAL_COMMUNITY): Payer: Self-pay

## 2023-11-12 ENCOUNTER — Encounter (INDEPENDENT_AMBULATORY_CARE_PROVIDER_SITE_OTHER): Payer: Self-pay

## 2023-11-15 ENCOUNTER — Other Ambulatory Visit (HOSPITAL_COMMUNITY): Payer: Self-pay

## 2023-11-15 ENCOUNTER — Other Ambulatory Visit: Payer: Self-pay

## 2023-11-15 NOTE — Progress Notes (Signed)
 Specialty Pharmacy Refill Coordination Note  Judith [name] is a 53 y.o. female contacted today regarding refills of specialty medication(s) Mepolizumab  (Nucala )   Patient requested Delivery   Delivery date: 11/17/23   Verified address: [address] [place] [address]   Medication will be filled on 11/16/23.

## 2023-11-15 NOTE — Progress Notes (Signed)
 Clinical Intervention Note  Clinical Intervention Notes: Patient reported starting on Dizaepam and Vyvanse , no DDIs were identified with Nucala .   Clinical Intervention Outcomes: Prevention of an adverse drug event   Silvano LOISE Blair Karel Santa

## 2023-11-16 DIAGNOSIS — J4540 Moderate persistent asthma, uncomplicated: Secondary | ICD-10-CM | POA: Diagnosis not present

## 2023-11-16 DIAGNOSIS — R051 Acute cough: Secondary | ICD-10-CM | POA: Diagnosis not present

## 2023-11-17 ENCOUNTER — Other Ambulatory Visit: Payer: Self-pay | Admitting: Family Medicine

## 2023-11-21 ENCOUNTER — Other Ambulatory Visit: Payer: Self-pay | Admitting: Family Medicine

## 2023-11-21 DIAGNOSIS — J3489 Other specified disorders of nose and nasal sinuses: Secondary | ICD-10-CM

## 2023-11-21 DIAGNOSIS — M5030 Other cervical disc degeneration, unspecified cervical region: Secondary | ICD-10-CM

## 2023-11-22 NOTE — Telephone Encounter (Signed)
 Gabapentin  is on med list at [facility], are you prescribing it or is she getting it from someone else?

## 2023-11-23 ENCOUNTER — Other Ambulatory Visit: Payer: Self-pay | Admitting: Family Medicine

## 2023-11-25 DIAGNOSIS — K2289 Other specified disease of esophagus: Secondary | ICD-10-CM | POA: Diagnosis not present

## 2023-11-25 DIAGNOSIS — Z882 Allergy status to sulfonamides status: Secondary | ICD-10-CM | POA: Diagnosis not present

## 2023-11-25 DIAGNOSIS — Z881 Allergy status to other antibiotic agents status: Secondary | ICD-10-CM | POA: Diagnosis not present

## 2023-11-25 DIAGNOSIS — Z888 Allergy status to other drugs, medicaments and biological substances status: Secondary | ICD-10-CM | POA: Diagnosis not present

## 2023-11-25 DIAGNOSIS — K220 Achalasia of cardia: Secondary | ICD-10-CM | POA: Diagnosis not present

## 2023-11-26 NOTE — Progress Notes (Unsigned)
 NEUROLOGY CONSULTATION NOTE  Judith [name] MRN: [id number] DOB: 06/11/1970  Referring provider: Norene Fielding, DO Primary care provider: Norene Fielding, DO  Reason for consult:  migraines  Assessment/Plan:   Migraine without aura, without status migrainosus, not intractable Vertigo - may be peripheral vs migrainous Cognitive changes - may be related to ADD and possibly underlying sleep apnea Recurrent transient spells - unclear if related to excessive daytime sleepiness vs ADD vs seizure Excessive daytime sleepiness with known history of sleep apnea   For further workup: MRI of brain without contrast Routine EEG Labs:  B12, vit D Refer to sleep medicine for re-evaluation of sleep apnea Migraine prevention:  Plan to start Aimovig  140mg  every 28 days (monitor blood pressure).  Will see if it may help the dizzy spells as well Migraine rescue:  Nurtec PRN.  For vertigo:  will prescribe meclizine  25mg  (advised to use sparingly) Limit use of pain relievers to no more than 9 days out of the month to prevent risk of rebound or medication-overuse headache. Keep headache diary Follow up 8 months.   Subjective:  Judith [name] is a 53 year old right-handed female with DM 2, asthma, hypothyroidism, HLD, ADD, Bipolar 1 disorder, depression, anxiety and arthritis who presents for headaches.  History supplemented by referring provider's note.  Migraines: History of migraines since she was 53 years old.  Describes a severe pounding in the back of her head with nausea, photophobia, and phonophobia.  Bright light and stress are triggers.  Now aborts quickly with Nurtec.  Occurs twice a week.  Dizziness: Since 2024, she has had episodes of vertigo, described as a spinning sensation.  Occurs with movement or spontaneously.  Sometimes she will have brief black out of vision.  Lasts 20-30 minutes.  Sometimes preceded by a severe paroxysmal stabbing pain in the right eye, otherwise no headache.   Episodes occur all year but frequency varies.  Thinking it may be migraine, she tried treating with Nurtec, which was ineffective.  It has contributed to falls.  Has had her ears checked, which were normal.    Cognitive changes, spells: She has longstanding history of ADD.  However, symptoms have been worse since 2024 and not responding to her medications.  She has trouble grasping what people are saying.  Difficult to focus.  She has hypothyroidism which has been controlled.  Last labs revealed TSH 0.044 with free T4 1.26.  Transient spells: Sometimes she spaces out but does not lose consciousness.  Lasts 30 minutes.  Occurs about twice a month. It has occurred while driving and then she needs to suddenly reorient herself.  Concerned because her brother had seizures as a child.    Excessive daytime sleepiness: She is easily fatigued during the day.  Sometimes can sleep several hours and still easily nod off.  She says that she gets up a couple of times during the night to use the bathroom but easily falls back asleep.  She was diagnosed with sleep apnea in 2019 after waking up at night gasping.  She was unable to tolerate the CPAP, so she stopped.  However, she lost weight and no longer wakes up gasping.    Past medications: Past NSAIDS/analgesics:  celecoxib, meloxicam , naproxen  Past abortive triptans:  sumatriptan  tab/[place] Past abortive ergotamine:  none Past muscle relaxants:  baclofen  Past anti-emetic:  Reglan  5mg , Zofran  4mg , Phenergan  25mg  Past antihypertensive medications:  propranolol, lisinopril , HCTZ Past antidepressant medications:  sertraline , Wellbutrin Past anticonvulsant medications:  topiramate (memory problems),  lamotrigine  Past anti-CGRP:  none  Current medications: Current NSAIDS/analgesics:  Percocet PRN Current triptans:  none Current ergotamine:  none Current anti-emetic:  Zofran -ODT 4mg  Current muscle relaxants:  Flexeril  10mg  Current Antihypertensive medications:   none Current Antidepressant medications:  fluoxetine  40mg  daily Current Anticonvulsant medications:  gabapentin  300mg  TID Current anti-CGRP:  Nurtec PRN Current Antihistamines/Decongestants:  azelastine  NS, Zyrtec  Other medications:  albuterol , diazepam 2mg  daily PRN, hydroxyzine , levothyroxine , Nucala , Vyvanse , Mounjaro   PAST MEDICAL HISTORY: Past Medical History:  Diagnosis Date   ADD (attention deficit disorder) without hyperactivity    Allergy    Anxiety    Arthritis    Asthma    Bipolar 1 disorder (HCC)    Blood transfusion without reported diagnosis    with child birth 16 yrs ago    Bronchitis, chronic (HCC)    Closed fracture of one rib of left side 03/15/2020   Constipation    COVID-19 virus infection 06/21/2020   DDD (degenerative disc disease), cervical    Depression    Diabetes mellitus without complication (HCC)    Dysphagia    Family history of adverse reaction to anesthesia    mother had a hard time waking up after knee surgery in 2021   Gastroparesis    GERD (gastroesophageal reflux disease)    History of kidney stones    History of MRSA infection    Hyperlipemia    Hypothyroid    Migraines    Pneumonia    Sleep apnea    no cpap     PAST SURGICAL HISTORY: Past Surgical History:  Procedure Laterality Date   ABDOMINAL SURGERY     ANTERIOR CERVICAL DECOMP/DISCECTOMY FUSION N/A 10/09/2020   Procedure: Anterior Cervical Discectomy Fusion Cervical five-six;  Surgeon: Debby Dorn MATSU, MD;  Location: [facility] OR;  Service: Neurosurgery;  Laterality: N/A;   BIOPSY  02/04/2021   Procedure: BIOPSY;  Surgeon: Leigh Elspeth SQUIBB, MD;  Location: [facility];  Service: Gastroenterology;;   BOTOX  INJECTION N/A 02/04/2021   Procedure: BOTOX  INJECTION;  Surgeon: Leigh Elspeth SQUIBB, MD;  Location: [facility];  Service: Gastroenterology;  Laterality: N/A;   CARPAL TUNNEL RELEASE Bilateral 1999   CARPOMETACARPEL SUSPENSION PLASTY Left 08/05/2022   Procedure: Left thumb  trapeziectomy and suspensionplasty;  Surgeon: Romona Harari, MD;  Location: [facility];  Service: Orthopedics;  Laterality: Left;  with regional block  120   CESAREAN SECTION     CHOLECYSTECTOMY     COLONOSCOPY     ESOPHAGEAL MANOMETRY N/A 07/08/2020   Procedure: ESOPHAGEAL MANOMETRY (EM);  Surgeon: Leigh Elspeth SQUIBB, MD;  Location: [facility];  Service: Gastroenterology;  Laterality: N/A;   ESOPHAGOGASTRODUODENOSCOPY (EGD) WITH PROPOFOL  N/A 02/04/2021   Procedure: ESOPHAGOGASTRODUODENOSCOPY (EGD) WITH PROPOFOL ;  Surgeon: Leigh Elspeth SQUIBB, MD;  Location: [facility];  Service: Gastroenterology;  Laterality: N/A;   REPLACEMENT TOTAL KNEE Bilateral    SCLEROTHERAPY  02/04/2021   Procedure: SCLEROTHERAPY;  Surgeon: Leigh Elspeth SQUIBB, MD;  Location: [facility];  Service: Gastroenterology;;   THYROIDECTOMY     TONSILLECTOMY     TONSILLECTOMY AND ADENOIDECTOMY     TRIGGER FINGER RELEASE Left 06/16/2021   Procedure: LEFT THUMB TRIGGER FINGER RELEASE;  Surgeon: Onesimo Oneil LABOR, MD;  Location: [facility];  Service: Orthopedics;  Laterality: Left;   TRIGGER FINGER RELEASE Right 09/29/2021   Procedure: RELEASE TRIGGER FINGER/A-1 PULLEY;  Surgeon: Onesimo Oneil LABOR, MD;  Location: [facility];  Service: Orthopedics;  Laterality: Right;  Right ring, index and thumb trigger finger  UPPER GASTROINTESTINAL ENDOSCOPY      MEDICATIONS: Current Outpatient Medications on File Prior to Visit  Medication Sig Dispense Refill   Accu-Chek FastClix Lancets MISC Test blood sugars daily Dx E11.9 102 each 3   albuterol  (PROVENTIL ) (2.5 MG/3ML) 0.083% nebulizer solution Take 3 mLs (2.5 mg total) by nebulization every 6 (six) hours as needed for wheezing or shortness of breath. 150 mL 1   albuterol  (VENTOLIN  HFA) 108 (90 Base) MCG/ACT inhaler Inhale 2 puffs into the lungs every 6 (six) hours as needed for wheezing or shortness of breath (Cough). 18 g 2   Azelastine  HCl 137 MCG/SPRAY SOLN PLACE 1 SPRAY  INTO BOTH NOSTRILS 2 (TWO) TIMES DAILY 30 mL 4   Blood Glucose Monitoring Suppl (ACCU-CHEK GUIDE ME) w/Device KIT Use to check BS BID and as needed. DX: E11.9 1 kit 0   cetirizine  (ZYRTEC ) 10 MG tablet Take 1 tablet (10 mg total) by mouth daily as needed for allergies. 90 tablet 3   Continuous Glucose Sensor (FREESTYLE LIBRE 3 SENSOR) MISC Use to check blood glucose continously, change sensor every 14 days 2 each PRN   cyclobenzaprine  (FLEXERIL ) 10 MG tablet Take 1 tablet (10 mg total) by mouth 3 (three) times daily as needed for muscle spasms. 30 tablet 1   dexlansoprazole  (DEXILANT ) 60 MG capsule Take 1 capsule (60 mg total) by mouth daily. Has failed: zantac, pepcid, prilosec, protonix , prevacid and nexium. 90 capsule 3   diazepam (VALIUM) 2 MG tablet Take 2 mg by mouth daily as needed.     diclofenac  Sodium (VOLTAREN ) 1 % GEL Apply 2 g topically 4 (four) times daily. 400 g PRN   dicyclomine  (BENTYL ) 20 MG tablet Take 1 tablet (20 mg) by mouth every 6 hours as needed for spasms (abdominal cramps). 120 tablet 2   EPINEPHrine  0.3 mg/0.3 mL IJ SOAJ injection Inject 0.3 mg into the muscle as needed for anaphylaxis. 1 each 2   FLUoxetine  (PROZAC ) 40 MG capsule Take 1 capsule (40 mg total) by mouth daily. Further fills per psychiatry 30 capsule 0   gabapentin  (NEURONTIN ) 300 MG capsule TAKE 1 CAPSULE BY MOUTH THREE TIMES A DAY 270 capsule 3   glucose blood (ACCU-CHEK GUIDE TEST) test strip TEST BLOOD SUGARS DAILY DX E11.9 100 strip 3   hydrOXYzine  (ATARAX ) 10 MG tablet Take 1 tablet (10 mg total) by mouth 3 (three) times daily as needed for anxiety. 270 tablet 1   insulin  degludec (TRESIBA  FLEXTOUCH) 100 UNIT/ML FlexTouch Pen INJECT 50-60 UNITS INTO THE SKIN DAILY. 18 mL 0   Insulin  Pen Needle (B-D UF III MINI PEN NEEDLES) 31G X 5 MM MISC use as directed once daily Dx E11.8 100 each 4   levothyroxine  (SYNTHROID ) 125 MCG tablet Take 1 tablet (125 mcg total) by mouth daily. 90 tablet 3   linaclotide   (LINZESS ) 72 MCG capsule Take 1 capsule (72 mcg total) by mouth daily before breakfast. 30 capsule 3   lisdexamfetamine (VYVANSE ) 40 MG capsule Take 40 mg by mouth every morning.     Mepolizumab  (NUCALA ) 100 MG/ML SOAJ Inject 1 mL (100 mg total) into the skin every 28 (twenty-eight) days. 3 mL 6   montelukast  (SINGULAIR ) 10 MG tablet TAKE 1 TABLET BY MOUTH EVERYDAY AT BEDTIME 90 tablet 1   nystatin  cream (MYCOSTATIN ) Apply 1 Application topically 2 (two) times daily. x2 weeks 60 g 0   ondansetron  (ZOFRAN -ODT) 4 MG disintegrating tablet Take 1 tablet (4 mg total) by mouth every 8 (eight) hours  as needed for nausea or vomiting. 12 tablet 0   PERCOCET 10-325 MG tablet Take 1 tablet by mouth every 8 (eight) hours as needed for pain. Per pain management     pioglitazone  (ACTOS ) 45 MG tablet Take 1 tablet (45 mg total) by mouth daily. 90 tablet 3   Rimegepant Sulfate (NURTEC) 75 MG TBDP Dissolve 1 tablet in mouth at onset of migraine headache. MAX 1 tablet per 24 hours. 10 tablet PRN   rosuvastatin  (CRESTOR ) 40 MG tablet Take 1 tablet (40 mg total) by mouth at bedtime. 90 tablet 3   tirzepatide  (MOUNJARO ) 15 MG/0.5ML Pen Inject 15 mg into the skin once a week. 6 mL 3   Current Facility-Administered Medications on File Prior to Visit  Medication Dose Route Frequency Provider Last Rate Last Admin   medroxyPROGESTERone  (DEPO-PROVERA ) injection 150 mg  150 mg Intramuscular Q90 days Gottschalk, Ashly M, DO   150 mg at 06/09/23 1210    ALLERGIES: Allergies  Allergen Reactions   Diphenhydramine  Other (See Comments)    Hair feels like it is crawling   Keflex  [Cephalexin ] Hives   Morphine  Nausea And Vomiting    PCA PUMP/ DRIP -- N/V IV PUSH IN ER NO PROBLEM PER PT.     Prednisone Nausea And Vomiting   Adhesive [Tape]     Paper tape causes irritation    Erythromycin Hives   Latex    Metformin And Related     Diarrhea with IR and XR products   Sulfonamide Derivatives Other (See Comments)    Does not  take due to family history    FAMILY HISTORY: Family History  Problem Relation Age of Onset   Asthma Mother    Bronchiolitis Mother    Atrial fibrillation Mother    GER disease Mother    Osteoporosis Mother    Osteoarthritis Mother    Dementia Mother        mild , was medically induced   Transient ischemic attack Mother        x 2   Aneurysm Mother        thoriatic   Bipolar disorder Mother    Depression Mother    Hyperlipidemia Mother    Non-Hodgkin's lymphoma Father    Hyperlipidemia Father    Breast cancer Paternal Grandmother    Lung cancer Paternal Grandmother    Mental illness Other    Diabetes Other    Esophageal cancer Other    Colon cancer Neg Hx    Stomach cancer Neg Hx    Pancreatic cancer Neg Hx    Colon polyps Neg Hx     Objective:  Blood pressure 134/76, pulse (!) 104, height 5' 7 (1.702 m), weight 268 lb (121.6 kg), SpO2 94%. General: No acute distress.  Patient appears well-groomed.   Head:  Normocephalic/atraumatic Eyes:  fundi examined but not visualized Neck: supple, no paraspinal tenderness, full range of motion Heart: regular rate and rhythm Neurological Exam: Mental status: alert and oriented to person, place, and time, speech fluent and not dysarthric, language intact. Cranial nerves: CN I: not tested CN II: pupils equal, round and reactive to light, visual fields intact CN III, IV, VI:  full range of motion, no nystagmus, no ptosis CN V: facial sensation intact. CN VII: upper and lower face symmetric CN VIII: hearing intact CN IX, X: gag intact, uvula midline CN XI: sternocleidomastoid and trapezius muscles intact CN XII: tongue midline Bulk & Tone: normal, no fasciculations. Motor:  muscle strength 5/5  throughout Sensation:  Pinprick reduced on dorsum of left foot, otherwise pinprick and vibratory sensation intact. Deep Tendon Reflexes:  trace throughout,  toes downgoing.   Finger to nose testing:  Without dysmetria.   Heel to shin:   Without dysmetria.   Gait:  Wide based.  Romberg negative.    Thank you for allowing me to take part in the care of this patient.  Juliene Dunnings, DO  CC: Norene Fielding, DO

## 2023-11-29 ENCOUNTER — Ambulatory Visit (INDEPENDENT_AMBULATORY_CARE_PROVIDER_SITE_OTHER): Payer: Medicare (Managed Care) | Admitting: Neurology

## 2023-11-29 ENCOUNTER — Other Ambulatory Visit: Payer: Medicare (Managed Care)

## 2023-11-29 ENCOUNTER — Encounter: Payer: Self-pay | Admitting: Neurology

## 2023-11-29 VITALS — BP 134/76 | HR 104 | Ht 67.0 in | Wt 268.0 lb

## 2023-11-29 DIAGNOSIS — G43109 Migraine with aura, not intractable, without status migrainosus: Secondary | ICD-10-CM | POA: Diagnosis not present

## 2023-11-29 DIAGNOSIS — R4189 Other symptoms and signs involving cognitive functions and awareness: Secondary | ICD-10-CM

## 2023-11-29 DIAGNOSIS — Z8669 Personal history of other diseases of the nervous system and sense organs: Secondary | ICD-10-CM

## 2023-11-29 DIAGNOSIS — R404 Transient alteration of awareness: Secondary | ICD-10-CM | POA: Diagnosis not present

## 2023-11-29 DIAGNOSIS — G4719 Other hypersomnia: Secondary | ICD-10-CM | POA: Diagnosis not present

## 2023-11-29 DIAGNOSIS — R42 Dizziness and giddiness: Secondary | ICD-10-CM | POA: Diagnosis not present

## 2023-11-29 MED ORDER — AIMOVIG 140 MG/ML ~~LOC~~ SOAJ: 140.0000 mg | SUBCUTANEOUS | 11 refills | Status: AC

## 2023-11-29 MED ORDER — MECLIZINE HCL 25 MG PO TABS: 25.0000 mg | ORAL_TABLET | Freq: Three times a day (TID) | ORAL | 5 refills | Status: DC | PRN

## 2023-11-29 NOTE — Patient Instructions (Addendum)
 Plan to start Aimovig  injection every 28 days.  If no improvement after 3 months, contact me and we can change Nurtec once daily as needed Take meclizine  as needed for dizziness but use sparingly Check MRI of brain  Check labs:  B12, vit D Check EEG Refer to sleep medicine Limit use of pain relievers to no more than 9 days out of the month to prevent risk of rebound or medication-overuse headache. Keep headache diary Follow up 8 months.

## 2023-11-30 ENCOUNTER — Ambulatory Visit: Payer: Self-pay | Admitting: Neurology

## 2023-11-30 LAB — VITAMIN D 25 HYDROXY (VIT D DEFICIENCY, FRACTURES): Vit D, 25-Hydroxy: 40 ng/mL (ref 30–100)

## 2023-11-30 LAB — VITAMIN B12: Vitamin B-12: 248 pg/mL (ref 200–1100)

## 2023-12-01 ENCOUNTER — Ambulatory Visit (INDEPENDENT_AMBULATORY_CARE_PROVIDER_SITE_OTHER): Payer: Medicare (Managed Care)

## 2023-12-01 DIAGNOSIS — R404 Transient alteration of awareness: Secondary | ICD-10-CM

## 2023-12-01 NOTE — Progress Notes (Signed)
 EEG complete and ready for review.

## 2023-12-01 NOTE — Procedures (Signed)
 ELECTROENCEPHALOGRAM REPORT  Date of Study: 12/01/2023  [name] Name: Judith [name] MRN: [id number] Date of Birth: [birth year]   Clinical History: 53 year old female with ADD, hypothyroidism and migraines who presents for recurrent spells of zoning out.  Brother had seizures as a child  Medications: Diazepam as needed Fluoxetine  Hydroxyzine  Gabapentin  Vyvanse  Nucala   levothyroxine    Technical Summary: A multichannel digital EEG recording measured by the international 10-20 system with electrodes applied with paste and impedances below 5000 ohms performed in our laboratory with EKG monitoring in an awake and drowsy patient.  Photic stimulation was performed.  The digital EEG was referentially recorded, reformatted, and digitally filtered in a variety of bipolar and referential montages for optimal display.    Description: The patient is awake and drowsy during the recording.  During maximal wakefulness, there is a symmetric, medium voltage 10 Hz posterior dominant rhythm that attenuates with eye opening.  The record is symmetric.  Stage 2 sleep is not seen.  Photic stimulation did not elicit any abnormalities.  There were no epileptiform discharges or electrographic seizures seen.    EKG lead was unremarkable.  Impression: This awake and drowsy EEG is normal.    Clinical Correlation: A normal EEG does not exclude a clinical diagnosis of epilepsy.  If further clinical questions remain, prolonged EEG may be helpful.  Clinical correlation is advised.   Juliene Dunnings, DO

## 2023-12-02 DIAGNOSIS — M5412 Radiculopathy, cervical region: Secondary | ICD-10-CM | POA: Diagnosis not present

## 2023-12-08 ENCOUNTER — Ambulatory Visit
Admission: RE | Admit: 2023-12-08 | Discharge: 2023-12-08 | Disposition: A | Discharge status: Home / Self Care | Payer: Medicare (Managed Care) | Source: Ambulatory Visit | Attending: Neurology

## 2023-12-08 DIAGNOSIS — R519 Headache, unspecified: Secondary | ICD-10-CM | POA: Diagnosis not present

## 2023-12-08 DIAGNOSIS — R4182 Altered mental status, unspecified: Secondary | ICD-10-CM | POA: Diagnosis not present

## 2023-12-08 DIAGNOSIS — R42 Dizziness and giddiness: Secondary | ICD-10-CM | POA: Diagnosis not present

## 2023-12-08 DIAGNOSIS — R4189 Other symptoms and signs involving cognitive functions and awareness: Secondary | ICD-10-CM

## 2023-12-09 ENCOUNTER — Other Ambulatory Visit: Payer: Self-pay | Admitting: Family Medicine

## 2023-12-09 DIAGNOSIS — F39 Unspecified mood [affective] disorder: Secondary | ICD-10-CM

## 2023-12-10 NOTE — Telephone Encounter (Signed)
 She is seeing psychiatry now. [facility] have this rerouted to them. Not sure if they were continuing it or not

## 2023-12-11 ENCOUNTER — Encounter: Payer: Self-pay | Admitting: Neurology

## 2023-12-14 ENCOUNTER — Other Ambulatory Visit: Payer: Self-pay

## 2023-12-15 ENCOUNTER — Other Ambulatory Visit: Payer: Self-pay

## 2023-12-15 ENCOUNTER — Telehealth: Payer: Self-pay | Admitting: Neurology

## 2023-12-15 ENCOUNTER — Encounter (INDEPENDENT_AMBULATORY_CARE_PROVIDER_SITE_OTHER): Payer: Self-pay

## 2023-12-15 NOTE — Progress Notes (Signed)
 Specialty Pharmacy Refill Coordination Note  Judith [name] is a 53 y.o. female contacted today regarding refills of specialty medication(s) Mepolizumab  (Nucala )   Patient requested (Patient-Rptd) Delivery   Delivery date: 12/17/23   Verified address: (Patient-Rptd) [address] [place] [address]   Medication will be filled on 12/16/23.

## 2023-12-15 NOTE — Telephone Encounter (Signed)
 Pt called stating she hasn't heard from anyone about her sleep study

## 2023-12-16 DIAGNOSIS — M79642 Pain in left hand: Secondary | ICD-10-CM | POA: Diagnosis not present

## 2023-12-16 DIAGNOSIS — M5416 Radiculopathy, lumbar region: Secondary | ICD-10-CM | POA: Diagnosis not present

## 2023-12-16 NOTE — Telephone Encounter (Signed)
 Advised of [facility] as referral.

## 2023-12-17 DIAGNOSIS — J4540 Moderate persistent asthma, uncomplicated: Secondary | ICD-10-CM | POA: Diagnosis not present

## 2023-12-17 DIAGNOSIS — R051 Acute cough: Secondary | ICD-10-CM | POA: Diagnosis not present

## 2023-12-17 NOTE — Progress Notes (Signed)
 Patient advised of her MRI results.  Sleep study consult is scheduled for 8/7

## 2023-12-20 ENCOUNTER — Other Ambulatory Visit (HOSPITAL_COMMUNITY): Payer: Self-pay

## 2023-12-20 NOTE — Progress Notes (Signed)
 @Patient  ID: Judith [name], female    DOB: 1970-08-07, 53 y.o.   MRN: [id number]  No chief complaint on file.   Referring provider: Skeet Juliene JONELLE, DO  HPI: 53 year old female, former smoker followed for asthma on biologic.  She is a patient of Dr. Leatha and last seen via virtual visit 09/27/2023.  Past medical history significant for migraines, hypertension, DM, GERD, hypothyroid, HLD, obesity, depression, ADD.  TEST/EVENTS:  09/26/2020 PFT: FVC 67, FEV1 75, ratio 89, TLC 67, DLCO 89.  Moderate restriction with normal diffusion capacity 10/04/2020 CXR: Mild bronchial thickening  09/27/2023: Virtual visit with Dr. Annella.  Poorly controlled asthma now with much better controlled on Nucala .  Seen for routine follow-up.  Doing well.  No issues.  Not really using inhalers.  Had 2 exacerbations over the spring and winter.  Had to use nebulizer for little bit but otherwise doing fine.  Allergies  Allergen Reactions   Diphenhydramine  Other (See Comments)    Hair feels like it is crawling   Keflex  [Cephalexin ] Hives   Morphine  Nausea And Vomiting    PCA PUMP/ DRIP -- N/V IV PUSH IN ER NO PROBLEM PER PT.     Prednisone Nausea And Vomiting   Adhesive [Tape]     Paper tape causes irritation    Erythromycin Hives   Latex    Metformin And Related     Diarrhea with IR and XR products   Sulfonamide Derivatives Other (See Comments)    Does not take due to family history    Immunization History  Administered Date(s) Administered   Influenza Inj Mdck Quad Pf 01/21/2021   Influenza, Seasonal, Injecte, Preservative Fre 02/09/2018   Influenza,inj,Quad PF,6+ Mos 02/14/2017, 02/20/2019, 04/23/2023   Influenza-Unspecified 02/05/2020, 01/21/2021   MMR [birth year]/1971   Moderna SARS-COV2 Booster Vaccination 06/05/2020   Moderna Sars-Covid-2 Vaccination 10/02/2019, 11/01/2019, 04/18/2020, 11/26/2020   PNEUMOCOCCAL CONJUGATE-20 11/26/2020   Pfizer Sars-cov-2 Pediatric Vaccine(25mos to <64yrs)  04/28/2023   Pneumococcal Polysaccharide-23 02/20/2019, 11/01/2019   Td 03/03/1985, 06/30/1994   Td (Adult), 2 Lf Tetanus Toxid, Preservative Free 03/03/1985, 06/30/1994   Tdap 12/24/2014, 08/15/2019, 11/26/2020   Zoster Recombinant(Shingrix ) 02/05/2020, 04/18/2020, 11/10/2022    Past Medical History:  Diagnosis Date   ADD (attention deficit disorder) without hyperactivity    Allergy    Anxiety    Arthritis    Asthma    Bipolar 1 disorder (HCC)    Blood transfusion without reported diagnosis    with child birth 16 yrs ago    Bronchitis, chronic (HCC)    Closed fracture of one rib of left side 03/15/2020   Constipation    COVID-19 virus infection 06/21/2020   DDD (degenerative disc disease), cervical    Depression    Diabetes mellitus without complication (HCC)    Dysphagia    Family history of adverse reaction to anesthesia    mother had a hard time waking up after knee surgery in 2021   Gastroparesis    GERD (gastroesophageal reflux disease)    History of kidney stones    History of MRSA infection    Hyperlipemia    Hypothyroid    Migraines    Pneumonia    Sleep apnea    no cpap     Tobacco History: Social History   Tobacco Use  Smoking Status Former   Current packs/day: 0.00   Average packs/day: 1 pack/day for 5.0 years (5.0 ttl pk-yrs)   Types: Cigarettes   Start date: 04/04/1988   Quit  date: 04/04/1993   Years since quitting: 30.7  Smokeless Tobacco Never   Counseling given: Not Answered   Outpatient Medications Prior to Visit  Medication Sig Dispense Refill   Accu-Chek FastClix Lancets MISC Test blood sugars daily Dx E11.9 102 each 3   albuterol  (PROVENTIL ) (2.5 MG/3ML) 0.083% nebulizer solution Take 3 mLs (2.5 mg total) by nebulization every 6 (six) hours as needed for wheezing or shortness of breath. 150 mL 1   albuterol  (VENTOLIN  HFA) 108 (90 Base) MCG/ACT inhaler Inhale 2 puffs into the lungs every 6 (six) hours as needed for wheezing or shortness  of breath (Cough). 18 g 2   Azelastine  HCl 137 MCG/SPRAY SOLN PLACE 1 SPRAY INTO BOTH NOSTRILS 2 (TWO) TIMES DAILY 30 mL 4   Blood Glucose Monitoring Suppl (ACCU-CHEK GUIDE ME) w/Device KIT Use to check BS BID and as needed. DX: E11.9 1 kit 0   cetirizine  (ZYRTEC ) 10 MG tablet Take 1 tablet (10 mg total) by mouth daily as needed for allergies. 90 tablet 3   Continuous Glucose Sensor (FREESTYLE LIBRE 3 SENSOR) MISC Use to check blood glucose continously, change sensor every 14 days 2 each PRN   cyclobenzaprine  (FLEXERIL ) 10 MG tablet Take 1 tablet (10 mg total) by mouth 3 (three) times daily as needed for muscle spasms. 30 tablet 1   dexlansoprazole  (DEXILANT ) 60 MG capsule Take 1 capsule (60 mg total) by mouth daily. Has failed: zantac, pepcid, prilosec, protonix , prevacid and nexium. 90 capsule 3   diazepam (VALIUM) 2 MG tablet Take 2 mg by mouth daily as needed.     diclofenac  Sodium (VOLTAREN ) 1 % GEL Apply 2 g topically 4 (four) times daily. 400 g PRN   dicyclomine  (BENTYL ) 20 MG tablet Take 1 tablet (20 mg) by mouth every 6 hours as needed for spasms (abdominal cramps). 120 tablet 2   EPINEPHrine  0.3 mg/0.3 mL IJ SOAJ injection Inject 0.3 mg into the muscle as needed for anaphylaxis. 1 each 2   Erenumab -aooe (AIMOVIG ) 140 MG/ML SOAJ Inject 140 mg into the skin every 28 (twenty-eight) days. 1.12 mL 11   FLUoxetine  (PROZAC ) 40 MG capsule Take 1 capsule (40 mg total) by mouth daily. Further fills per psychiatry 30 capsule 0   gabapentin  (NEURONTIN ) 300 MG capsule TAKE 1 CAPSULE BY MOUTH THREE TIMES A DAY (Patient taking differently: Take 300 mg by mouth 4 (four) times daily. 300mg  in the Morning, and 900 at night) 270 capsule 3   glucose blood (ACCU-CHEK GUIDE TEST) test strip TEST BLOOD SUGARS DAILY DX E11.9 100 strip 3   hydrOXYzine  (ATARAX ) 10 MG tablet Take 1 tablet (10 mg total) by mouth 3 (three) times daily as needed for anxiety. (Patient taking differently: Take 10 mg by mouth 3 (three)  times daily as needed for anxiety. 30 mg at bedtime) 270 tablet 1   insulin  degludec (TRESIBA  FLEXTOUCH) 100 UNIT/ML FlexTouch Pen INJECT 50-60 UNITS INTO THE SKIN DAILY. (Patient taking differently: Inject 50 Units into the skin as needed.) 18 mL 0   Insulin  Pen Needle (B-D UF III MINI PEN NEEDLES) 31G X 5 MM MISC use as directed once daily Dx E11.8 100 each 4   levothyroxine  (SYNTHROID ) 125 MCG tablet Take 1 tablet (125 mcg total) by mouth daily. 90 tablet 3   linaclotide  (LINZESS ) 72 MCG capsule Take 1 capsule (72 mcg total) by mouth daily before breakfast. (Patient taking differently: Take 72 mcg by mouth as needed.) 30 capsule 3   lisdexamfetamine (VYVANSE ) 40 MG  capsule Take 40 mg by mouth every morning.     meclizine  (ANTIVERT ) 25 MG tablet Take 1 tablet (25 mg total) by mouth 3 (three) times daily as needed for dizziness. 20 tablet 5   Mepolizumab  (NUCALA ) 100 MG/ML SOAJ Inject 1 mL (100 mg total) into the skin every 28 (twenty-eight) days. 3 mL 6   montelukast  (SINGULAIR ) 10 MG tablet TAKE 1 TABLET BY MOUTH EVERYDAY AT BEDTIME 90 tablet 1   nystatin  cream (MYCOSTATIN ) Apply 1 Application topically 2 (two) times daily. x2 weeks 60 g 0   ondansetron  (ZOFRAN -ODT) 4 MG disintegrating tablet Take 1 tablet (4 mg total) by mouth every 8 (eight) hours as needed for nausea or vomiting. 12 tablet 0   PERCOCET 10-325 MG tablet Take 1 tablet by mouth every 8 (eight) hours as needed for pain. Per pain management     pioglitazone  (ACTOS ) 45 MG tablet Take 1 tablet (45 mg total) by mouth daily. 90 tablet 3   Rimegepant Sulfate (NURTEC) 75 MG TBDP Dissolve 1 tablet in mouth at onset of migraine headache. MAX 1 tablet per 24 hours. (Patient taking differently: Take 75 mg by mouth as needed. Dissolve 1 tablet in mouth at onset of migraine headache. MAX 1 tablet per 24 hours.) 10 tablet PRN   rosuvastatin  (CRESTOR ) 40 MG tablet Take 1 tablet (40 mg total) by mouth at bedtime. 90 tablet 3   tirzepatide   (MOUNJARO ) 15 MG/0.5ML Pen Inject 15 mg into the skin once a week. 6 mL 3   No facility-administered medications prior to visit.     Review of Systems:   Constitutional: No weight loss or gain, night sweats, fevers, chills, fatigue, or lassitude. HEENT: No headaches, difficulty swallowing, tooth/dental problems, or sore throat. No sneezing, itching, ear ache, nasal congestion, or post nasal drip CV:  No chest pain, orthopnea, PND, swelling in lower extremities, anasarca, dizziness, palpitations, syncope Resp: No shortness of breath with exertion or at rest. No excess mucus or change in color of mucus. No productive or non-productive. No hemoptysis. No wheezing.  No chest wall deformity GI:  No heartburn, indigestion, abdominal pain, nausea, vomiting, diarrhea, change in bowel habits, loss of appetite, bloody stools.  GU: No dysuria, change in color of urine, urgency or frequency.  No flank pain, no hematuria  Skin: No rash, lesions, ulcerations MSK:  No joint pain or swelling.  No decreased range of motion.  No back pain. Neuro: No dizziness or lightheadedness.  Psych: No depression or anxiety. Mood stable.     Physical Exam:  There were no vitals taken for this visit.  GEN: Pleasant, interactive, well-nourished/chronically-ill appearing/acutely-ill appearing/poorly-nourished/morbidly obese; in no acute distress.****** HEENT:  Normocephalic and atraumatic. EACs patent bilaterally. TM pearly gray with present light reflex bilaterally. PERRLA. Sclera white. Nasal turbinates pink, moist and patent bilaterally. No rhinorrhea present. Oropharynx pink and moist, without exudate or edema. No lesions, ulcerations, or postnasal drip.  NECK:  Supple w/ fair ROM. No JVD present. Normal carotid impulses w/o bruits. Thyroid  symmetrical with no goiter or nodules palpated. No lymphadenopathy.   CV: RRR, no m/r/g, no peripheral edema. Pulses intact, +2 bilaterally. No cyanosis, pallor or  clubbing. PULMONARY:  Unlabored, regular breathing. Clear bilaterally A&P w/o wheezes/rales/rhonchi. No accessory muscle use.  GI: BS present and normoactive. Soft, non-tender to palpation. No organomegaly or masses detected. No CVA tenderness. MSK: No erythema, warmth or tenderness. Cap refil <2 sec all extrem. No deformities or joint swelling noted.  Neuro: A/Ox3. No focal  deficits noted.   Skin: Warm, no lesions or rashe Psych: Normal affect and behavior. Judgement and thought content appropriate.     Lab Results:  CBC    Component Value Date/Time   WBC 7.0 11/03/2023 1206   WBC 8.8 06/13/2021 1021   RBC 4.92 11/03/2023 1206   RBC 4.94 06/13/2021 1021   HGB 14.0 11/03/2023 1206   HCT 43.0 11/03/2023 1206   PLT 311 11/03/2023 1206   MCV 87 11/03/2023 1206   MCH 28.5 11/03/2023 1206   MCH 26.5 06/13/2021 1021   MCHC 32.6 11/03/2023 1206   MCHC 31.3 06/13/2021 1021   RDW 13.0 11/03/2023 1206   LYMPHSABS 2.7 11/03/2023 1206   MONOABS 0.7 04/04/2020 1155   EOSABS 0.0 11/03/2023 1206   BASOSABS 0.1 11/03/2023 1206    BMET    Component Value Date/Time   NA 138 11/03/2023 1206   K 4.1 11/03/2023 1206   CL 102 11/03/2023 1206   CO2 19 (L) 11/03/2023 1206   GLUCOSE 141 (H) 11/03/2023 1206   GLUCOSE 139 (H) 08/04/2022 1521   BUN 15 11/03/2023 1206   CREATININE 0.73 11/03/2023 1206   CALCIUM  9.1 11/03/2023 1206   GFRNONAA >60 08/04/2022 1521   GFRAA 94 07/04/2020 0846    BNP No results found for: BNP   Imaging:  MR BRAIN WO CONTRAST Result Date: 12/15/2023 CLINICAL DATA:  Mental status change, unknown cause. Cognitive changes. Dizziness and headache. EXAM: MRI HEAD WITHOUT CONTRAST TECHNIQUE: Multiplanar, multiecho pulse sequences of the brain and surrounding structures were obtained without intravenous contrast. COMPARISON:  CT head 11/03/2018. FINDINGS: Brain: No acute infarct. No evidence of intracranial hemorrhage. White matter is unremarkable for patient's age.  No edema, mass effect, or midline shift. Posterior fossa is unremarkable. Normal appearance of midline structures. The basilar cisterns are patent. No extra-axial fluid collections. Ventricles: Normal size and configuration of the ventricles. Vascular: Skull base flow voids are visualized. Skull and upper cervical spine: No focal abnormality. Sinuses/Orbits: Orbits are symmetric. Paranasal sinuses are clear. Other: Mastoid air cells are clear. IMPRESSION: No acute intracranial abnormality. No lobar specific atrophy. Electronically Signed   By: Donnice Mania M.D.   On: 12/15/2023 20:37   EEG adult Result Date: 12/01/2023 Skeet Juliene SAUNDERS, DO     12/01/2023  4:30 PM ELECTROENCEPHALOGRAM REPORT Date of Study: 12/01/2023 [name] Name: Judith [name] MRN: [id number] Date of Birth: 31-Dec-1970 Clinical History: 52 year old female with ADD, hypothyroidism and migraines who presents for recurrent spells of zoning out.  Brother had seizures as a child Medications: Diazepam as needed Fluoxetine  Hydroxyzine  Gabapentin  Vyvanse  Nucala  levothyroxine  Technical Summary: A multichannel digital EEG recording measured by the international 10-20 system with electrodes applied with paste and impedances below 5000 ohms performed in our laboratory with EKG monitoring in an awake and drowsy patient.  Photic stimulation was performed.  The digital EEG was referentially recorded, reformatted, and digitally filtered in a variety of bipolar and referential montages for optimal display.  Description: The patient is awake and drowsy during the recording.  During maximal wakefulness, there is a symmetric, medium voltage 10 Hz posterior dominant rhythm that attenuates with eye opening.  The record is symmetric.  Stage 2 sleep is not seen.  Photic stimulation did not elicit any abnormalities.  There were no epileptiform discharges or electrographic seizures seen.  EKG lead was unremarkable. Impression: This awake and drowsy EEG is normal.  Clinical  Correlation: A normal EEG does not exclude a clinical diagnosis of  epilepsy.  If further clinical questions remain, prolonged EEG may be helpful.  Clinical correlation is advised. Juliene Dunnings, DO    Administration History     None          Latest Ref Rng & Units 09/26/2020    9:52 AM  PFT Results  FVC-Pre L 2.75   FVC-Predicted Pre % 67   FVC-Post L 2.60   FVC-Predicted Post % 64   Pre FEV1/FVC % % 87   Post FEV1/FCV % % 89   FEV1-Pre L 2.40   FEV1-Predicted Pre % 75   FEV1-Post L 2.33   DLCO uncorrected ml/min/mmHg 21.53   DLCO UNC% % 89   DLCO corrected ml/min/mmHg 21.53   DLCO COR %Predicted % 89   DLVA Predicted % 127   TLC L 3.84   TLC % Predicted % 67   RV % Predicted % 59     No results found for: NITRICOXIDE      Assessment & Plan:   No problem-specific Assessment & Plan notes found for this encounter.   Advised if symptoms do not improve or worsen, to please contact office for sooner follow up or seek emergency care.   I spent *** minutes of dedicated to the care of this patient on the date of this encounter to include pre-visit review of records, face-to-face time with the patient discussing conditions above, post visit ordering of testing, clinical documentation with the electronic health record, making appropriate referrals as documented, and communicating necessary findings to members of the patients care team.  Judith LULLA Rouleau, NP 12/20/2023  Pt aware and understands NP's role.

## 2023-12-21 ENCOUNTER — Ambulatory Visit (INDEPENDENT_AMBULATORY_CARE_PROVIDER_SITE_OTHER): Payer: Medicare (Managed Care) | Admitting: Nurse Practitioner

## 2023-12-21 ENCOUNTER — Encounter (HOSPITAL_BASED_OUTPATIENT_CLINIC_OR_DEPARTMENT_OTHER): Payer: Self-pay | Admitting: Nurse Practitioner

## 2023-12-21 VITALS — BP 127/92 | HR 97 | Wt 270.0 lb

## 2023-12-21 DIAGNOSIS — Z6841 Body Mass Index (BMI) 40.0 and over, adult: Secondary | ICD-10-CM | POA: Diagnosis not present

## 2023-12-21 DIAGNOSIS — J4540 Moderate persistent asthma, uncomplicated: Secondary | ICD-10-CM | POA: Diagnosis not present

## 2023-12-21 DIAGNOSIS — G4719 Other hypersomnia: Secondary | ICD-10-CM

## 2023-12-21 DIAGNOSIS — G4733 Obstructive sleep apnea (adult) (pediatric): Secondary | ICD-10-CM

## 2023-12-21 DIAGNOSIS — Z87891 Personal history of nicotine dependence: Secondary | ICD-10-CM | POA: Diagnosis not present

## 2023-12-21 DIAGNOSIS — R0683 Snoring: Secondary | ICD-10-CM | POA: Diagnosis not present

## 2023-12-21 NOTE — Patient Instructions (Addendum)
 Given your symptoms, I am concerned that you may have sleep disordered breathing with sleep apnea. You will need a sleep study for further evaluation. Someone will contact you to schedule this.   We discussed how untreated sleep apnea puts an individual at risk for cardiac arrhthymias, pulm HTN, DM, stroke and increases their risk for daytime accidents. We also briefly reviewed treatment options including weight loss, side sleeping position, oral appliance, CPAP therapy or referral to ENT for possible surgical options  Use caution when driving and pull over if you become sleepy.  Continue Albuterol  inhaler 2 puffs or 3 mL neb every 6 hours as needed for shortness of breath or wheezing. Notify if symptoms persist despite rescue inhaler/neb use.  Continue Nucala  injections as scheduled  Continue montelukast  1 tab daily  Follow up in 6 weeks with Judith Sabre Leonetti,NP to go over sleep study results, or sooner, if needed. Friday PM virtual clinic preferred

## 2023-12-22 ENCOUNTER — Encounter (HOSPITAL_BASED_OUTPATIENT_CLINIC_OR_DEPARTMENT_OTHER): Payer: Self-pay | Admitting: Nurse Practitioner

## 2023-12-22 ENCOUNTER — Ambulatory Visit: Payer: Medicare (Managed Care)

## 2023-12-22 DIAGNOSIS — G4719 Other hypersomnia: Secondary | ICD-10-CM | POA: Insufficient documentation

## 2023-12-22 DIAGNOSIS — R0683 Snoring: Secondary | ICD-10-CM | POA: Insufficient documentation

## 2023-12-22 DIAGNOSIS — M5416 Radiculopathy, lumbar region: Secondary | ICD-10-CM | POA: Diagnosis not present

## 2023-12-22 DIAGNOSIS — G4733 Obstructive sleep apnea (adult) (pediatric): Secondary | ICD-10-CM | POA: Insufficient documentation

## 2023-12-22 NOTE — Assessment & Plan Note (Signed)
 Stable. No recent exacerbations. Follow up with Dr. Annella as scheduled. Action plan in place.

## 2023-12-22 NOTE — Assessment & Plan Note (Signed)
 She has a hx of mild OSA. She has snoring, excessive daytime sleepiness, restless sleep, migraines. BMI 42. Given this,  I am concerned she still has sleep disordered breathing with obstructive sleep apnea. She will need a repeat sleep study for further evaluation.    - discussed how weight can impact sleep and risk for sleep disordered breathing - discussed options to assist with weight loss: combination of diet modification, cardiovascular and strength training exercises   - had an extensive discussion regarding the adverse health consequences related to untreated sleep disordered breathing - specifically discussed the risks for hypertension, coronary artery disease, cardiac dysrhythmias, cerebrovascular disease, and diabetes - lifestyle modification discussed   - discussed how sleep disruption can increase risk of accidents, particularly when driving - safe driving practices were discussed  Patient Instructions  Given your symptoms, I am concerned that you may have sleep disordered breathing with sleep apnea. You will need a sleep study for further evaluation. Someone will contact you to schedule this.   We discussed how untreated sleep apnea puts an individual at risk for cardiac arrhthymias, pulm HTN, DM, stroke and increases their risk for daytime accidents. We also briefly reviewed treatment options including weight loss, side sleeping position, oral appliance, CPAP therapy or referral to ENT for possible surgical options  Use caution when driving and pull over if you become sleepy.  Continue Albuterol  inhaler 2 puffs or 3 mL neb every 6 hours as needed for shortness of breath or wheezing. Notify if symptoms persist despite rescue inhaler/neb use.  Continue Nucala  injections as scheduled  Continue montelukast  1 tab daily  Follow up in 6 weeks with Katie Dawnyel Leven,NP to go over sleep study results, or sooner, if needed. Friday PM virtual clinic preferred

## 2023-12-22 NOTE — Assessment & Plan Note (Signed)
 BMI 42. Healthy weight loss encouraged.

## 2023-12-22 NOTE — Assessment & Plan Note (Signed)
 See above

## 2023-12-22 NOTE — Assessment & Plan Note (Signed)
 Not on CPAP. See above

## 2023-12-25 ENCOUNTER — Other Ambulatory Visit: Payer: Self-pay | Admitting: Family Medicine

## 2023-12-25 DIAGNOSIS — G5602 Carpal tunnel syndrome, left upper limb: Secondary | ICD-10-CM | POA: Diagnosis not present

## 2024-01-03 DIAGNOSIS — G4733 Obstructive sleep apnea (adult) (pediatric): Secondary | ICD-10-CM

## 2024-01-03 DIAGNOSIS — G4719 Other hypersomnia: Secondary | ICD-10-CM

## 2024-01-03 DIAGNOSIS — R0683 Snoring: Secondary | ICD-10-CM

## 2024-01-04 ENCOUNTER — Telehealth: Payer: Self-pay | Admitting: Family Medicine

## 2024-01-04 DIAGNOSIS — Z7985 Long-term (current) use of injectable non-insulin antidiabetic drugs: Secondary | ICD-10-CM

## 2024-01-05 ENCOUNTER — Encounter (INDEPENDENT_AMBULATORY_CARE_PROVIDER_SITE_OTHER): Payer: Self-pay

## 2024-01-05 ENCOUNTER — Other Ambulatory Visit: Payer: Self-pay

## 2024-01-05 ENCOUNTER — Other Ambulatory Visit: Payer: Self-pay | Admitting: Family Medicine

## 2024-01-05 ENCOUNTER — Other Ambulatory Visit (HOSPITAL_COMMUNITY): Payer: Self-pay

## 2024-01-05 ENCOUNTER — Other Ambulatory Visit: Payer: Self-pay | Admitting: Pulmonary Disease

## 2024-01-05 DIAGNOSIS — J4540 Moderate persistent asthma, uncomplicated: Secondary | ICD-10-CM

## 2024-01-05 MED ORDER — GLUCOSE BLOOD VI STRP: ORAL_STRIP | 12 refills | Status: DC

## 2024-01-05 MED ORDER — NUCALA 100 MG/ML ~~LOC~~ SOAJ
100.0000 mg | SUBCUTANEOUS | 1 refills | Status: DC
  Filled 2024-01-05: qty 1, 28d supply, fill #0
  Filled 2024-02-11: qty 1, 28d supply, fill #1
  Filled 2024-03-09 – 2024-03-14 (×2): qty 1, 28d supply, fill #2
  Filled 2024-04-12: qty 1, 28d supply, fill #3
  Filled 2024-05-15 – 2024-05-30 (×4): qty 1, 28d supply, fill #4

## 2024-01-05 NOTE — Telephone Encounter (Signed)
 Please see SIG: Take 1 capsule (60 mg total) by mouth daily. Has failed: zantac, pepcid, prilosec, protonix , prevacid and nexium.

## 2024-01-05 NOTE — Progress Notes (Signed)
 Specialty Pharmacy Refill Coordination Note  Judith [name] is a 53 y.o. female contacted today regarding refills of specialty medication(s) Mepolizumab  (Nucala )   Patient requested (Patient-Rptd) Delivery   Delivery date: 01/07/24   Verified address: (Patient-Rptd) [address] [place] [address]   Medication will be filled on 08.21.25.    Refill request pending

## 2024-01-05 NOTE — Telephone Encounter (Signed)
 Pharmacy Patient Advocate Encounter  Received notification from [employer] that Prior Authorization for Dexlansoprazole  60MG  dr capsules  has been APPROVED from 12/06/23 to 01/04/25. Ran test claim, Copay is $0. This test claim was processed through [facility]- copay amounts may vary at other pharmacies due to pharmacy/plan contracts, or as the patient moves through the different stages of their insurance plan.   PA #/Case ID/Reference #: [id number]

## 2024-01-05 NOTE — Addendum Note (Signed)
 Addended by: MAR CHIQUITA HERO on: 01/05/2024 10:52 AM   Modules accepted: Orders

## 2024-01-05 NOTE — Telephone Encounter (Signed)
 Refill sent for NUCALA  to [facility]: [phone] .   Taking Nucala  since 10/2020.  Dose: Nucala  100mg  Colbert every 28 days   Last OV: 12/21/23 with Comer Rouleau Provider: Dr. Annella  Next OV: 02/18/24 with Comer Rouleau  Aleck Puls, PharmD, BCPS Clinical Pharmacist  [facility]

## 2024-01-05 NOTE — Telephone Encounter (Signed)
 Pt aware and verbalized understanding    Pt states medicare is no longer covering accucheck and that they will cover onetouch monitor and strips instead. Informed pt I will send new rx for one touch to pharmacy per pt request.

## 2024-01-05 NOTE — Telephone Encounter (Signed)
 omeprazole (PRILOSEC) 40 MG capsule        Changed from: dexlansoprazole  (DEXILANT ) 60 MG capsule   Pharmacy comment: Alternative Requested:NOT COVERED.   All Pharmacy Suggested Alternatives:  omeprazole (PRILOSEC) 40 MG capsule pantoprazole  (PROTONIX ) 20 MG tablet esomeprazole (NEXIUM) 20 MG capsule

## 2024-01-06 MED ORDER — ONETOUCH VERIO FLEX SYSTEM W/DEVICE KIT
PACK | 0 refills | Status: DC
Start: 2024-01-06 — End: 2024-04-06

## 2024-01-07 ENCOUNTER — Other Ambulatory Visit: Payer: Self-pay | Admitting: Family Medicine

## 2024-01-07 DIAGNOSIS — Z1231 Encounter for screening mammogram for malignant neoplasm of breast: Secondary | ICD-10-CM

## 2024-01-12 ENCOUNTER — Ambulatory Visit
Admission: RE | Admit: 2024-01-12 | Discharge: 2024-01-12 | Disposition: A | Discharge status: Home / Self Care | Payer: Medicare (Managed Care) | Source: Ambulatory Visit | Attending: Family Medicine

## 2024-01-12 DIAGNOSIS — Z1231 Encounter for screening mammogram for malignant neoplasm of breast: Secondary | ICD-10-CM | POA: Diagnosis not present

## 2024-01-17 DIAGNOSIS — R051 Acute cough: Secondary | ICD-10-CM | POA: Diagnosis not present

## 2024-01-17 DIAGNOSIS — J4540 Moderate persistent asthma, uncomplicated: Secondary | ICD-10-CM | POA: Diagnosis not present

## 2024-01-20 ENCOUNTER — Ambulatory Visit: Payer: Medicare (Managed Care)

## 2024-01-20 ENCOUNTER — Encounter: Payer: Self-pay | Admitting: Family Medicine

## 2024-01-20 DIAGNOSIS — M222X1 Patellofemoral disorders, right knee: Secondary | ICD-10-CM

## 2024-01-20 DIAGNOSIS — J3089 Other allergic rhinitis: Secondary | ICD-10-CM

## 2024-01-22 DIAGNOSIS — M654 Radial styloid tenosynovitis [de Quervain]: Secondary | ICD-10-CM | POA: Diagnosis not present

## 2024-01-22 DIAGNOSIS — M79645 Pain in left finger(s): Secondary | ICD-10-CM | POA: Diagnosis not present

## 2024-01-24 ENCOUNTER — Other Ambulatory Visit: Payer: Self-pay | Admitting: Family Medicine

## 2024-01-24 DIAGNOSIS — M222X1 Patellofemoral disorders, right knee: Secondary | ICD-10-CM

## 2024-01-24 MED ORDER — LEVOCETIRIZINE DIHYDROCHLORIDE 5 MG PO TABS
5.0000 mg | ORAL_TABLET | Freq: Every evening | ORAL | 3 refills | Status: AC
Start: 2024-01-24 — End: ?

## 2024-01-24 MED ORDER — DICLOFENAC SODIUM 3 % EX GEL: 4.0000 g | Freq: Every day | CUTANEOUS | 3 refills | Status: AC | PRN

## 2024-01-24 NOTE — Telephone Encounter (Signed)
 She wants to use for pain not for skin issues.  Apparently, her insurance person told her to get it changed to the 3%. If not covered for arthritis she may simply have to continue paying for the 1%

## 2024-01-24 NOTE — Telephone Encounter (Signed)
 Imiquimod Pump 3.75 % CREA        Changed from: Diclofenac  Sodium 3 % GEL   Pharmacy comment: Alternative Requested:THE PRESCRIBED MEDICATION IS NOT COVERED BY INSURANCE. PLEASE CONSIDER CHANGING TO ONE OF THE SUGGESTED COVERED ALTERNATIVES

## 2024-01-25 DIAGNOSIS — M180 Bilateral primary osteoarthritis of first carpometacarpal joints: Secondary | ICD-10-CM | POA: Diagnosis not present

## 2024-01-25 DIAGNOSIS — M79645 Pain in left finger(s): Secondary | ICD-10-CM | POA: Diagnosis not present

## 2024-01-25 DIAGNOSIS — M654 Radial styloid tenosynovitis [de Quervain]: Secondary | ICD-10-CM | POA: Diagnosis not present

## 2024-01-25 DIAGNOSIS — M79642 Pain in left hand: Secondary | ICD-10-CM | POA: Diagnosis not present

## 2024-01-25 NOTE — H&P (Signed)
 [name] anticipated LOS is less than 2 midnights, meeting these requirements: - Younger than 80 - Lives within 1 hour of care - Has a competent adult at home to recover with post-op recover - NO history of  - Chronic pain requiring opiods  - Diabetes  - Coronary Artery Disease  - Heart failure  - Heart attack  - Stroke  - DVT/VTE  - Cardiac arrhythmia  - Respiratory Failure/COPD  - Renal failure  - Anemia  - Advanced Liver disease     Judith [name] is an 53 y.o. female.    Chief Complaint: left shoulder pain  HPI: Pt is a 53 y.o. female complaining of left shoulder pain for multiple years. Pain had continually increased since the beginning. X-rays in the clinic show rotator cuff tear left shoulder. Pt has tried various conservative treatments which have failed to alleviate their symptoms, including injections and therapy. Various options are discussed with the patient. Risks, benefits and expectations were discussed with the patient. Patient understand the risks, benefits and expectations and wishes to proceed with surgery.   PCP:  Jolinda Norene HERO, DO  D/C Plans: Home  PMH: Past Medical History:  Diagnosis Date   ADD (attention deficit disorder) without hyperactivity    Allergy    Anxiety    Arthritis    Asthma    Bipolar 1 disorder (HCC)    Blood transfusion without reported diagnosis    with child birth 16 yrs ago    Bronchitis, chronic (HCC)    Closed fracture of one rib of left side 03/15/2020   Constipation    COVID-19 virus infection 06/21/2020   DDD (degenerative disc disease), cervical    Depression    Diabetes mellitus without complication (HCC)    Dysphagia    Family history of adverse reaction to anesthesia    mother had a hard time waking up after knee surgery in 2021   Gastroparesis    GERD (gastroesophageal reflux disease)    History of kidney stones    History of MRSA infection    Hyperlipemia    Hypothyroid    Migraines    Pneumonia     Sleep apnea    no cpap     PSH: Past Surgical History:  Procedure Laterality Date   ABDOMINAL SURGERY     ANTERIOR CERVICAL DECOMP/DISCECTOMY FUSION N/A 10/09/2020   Procedure: Anterior Cervical Discectomy Fusion Cervical five-six;  Surgeon: Debby Dorn MATSU, MD;  Location: [facility] OR;  Service: Neurosurgery;  Laterality: N/A;   BIOPSY  02/04/2021   Procedure: BIOPSY;  Surgeon: Leigh Elspeth SQUIBB, MD;  Location: [facility];  Service: Gastroenterology;;   BOTOX  INJECTION N/A 02/04/2021   Procedure: BOTOX  INJECTION;  Surgeon: Leigh Elspeth SQUIBB, MD;  Location: [facility];  Service: Gastroenterology;  Laterality: N/A;   CARPAL TUNNEL RELEASE Bilateral 1999   CARPOMETACARPEL SUSPENSION PLASTY Left 08/05/2022   Procedure: Left thumb trapeziectomy and suspensionplasty;  Surgeon: Romona Harari, MD;  Location: [facility];  Service: Orthopedics;  Laterality: Left;  with regional block  120   CESAREAN SECTION     CHOLECYSTECTOMY     COLONOSCOPY     ESOPHAGEAL MANOMETRY N/A 07/08/2020   Procedure: ESOPHAGEAL MANOMETRY (EM);  Surgeon: Leigh Elspeth SQUIBB, MD;  Location: [facility];  Service: Gastroenterology;  Laterality: N/A;   ESOPHAGOGASTRODUODENOSCOPY (EGD) WITH PROPOFOL  N/A 02/04/2021   Procedure: ESOPHAGOGASTRODUODENOSCOPY (EGD) WITH PROPOFOL ;  Surgeon: Leigh Elspeth SQUIBB, MD;  Location: [facility];  Service: Gastroenterology;  Laterality: N/A;  REPLACEMENT TOTAL KNEE Bilateral    SCLEROTHERAPY  02/04/2021   Procedure: SCLEROTHERAPY;  Surgeon: Leigh Elspeth SQUIBB, MD;  Location: [facility];  Service: Gastroenterology;;   THYROIDECTOMY     TONSILLECTOMY     TONSILLECTOMY AND ADENOIDECTOMY     TRIGGER FINGER RELEASE Left 06/16/2021   Procedure: LEFT THUMB TRIGGER FINGER RELEASE;  Surgeon: Onesimo Oneil LABOR, MD;  Location: [facility];  Service: Orthopedics;  Laterality: Left;   TRIGGER FINGER RELEASE Right 09/29/2021   Procedure: RELEASE TRIGGER FINGER/A-1 PULLEY;   Surgeon: Onesimo Oneil LABOR, MD;  Location: [facility];  Service: Orthopedics;  Laterality: Right;  Right ring, index and thumb trigger finger   UPPER GASTROINTESTINAL ENDOSCOPY      Social History:  reports that she quit smoking about 30 years ago. Her smoking use included cigarettes. She started smoking about 35 years ago. She has a 5 pack-year smoking history. She has never used smokeless tobacco. She reports current drug use. Drug: Marijuana. She reports that she does not drink alcohol. BMI: Estimated body mass index is 42.29 kg/m as calculated from the following:   Height as of 11/29/23: 5' 7 (1.702 m).   Weight as of 12/21/23: 122.5 kg.  Lab Results  Component Value Date   ALBUMIN 3.9 11/03/2023   Diabetes:   Patient has a diagnosis of diabetes,  Lab Results  Component Value Date   HGBA1C 7.4 (H) 11/03/2023   Smoking Status:      Allergies:  Allergies  Allergen Reactions   Diphenhydramine  Other (See Comments)    Hair feels like it is crawling   Keflex  [Cephalexin ] Hives   Morphine  Nausea And Vomiting    PCA PUMP/ DRIP -- N/V IV PUSH IN ER NO PROBLEM PER PT.     Prednisone Nausea And Vomiting   Adhesive [Tape]     Paper tape causes irritation    Erythromycin Hives   Latex    Metformin And Related     Diarrhea with IR and XR products   Sulfonamide Derivatives Other (See Comments)    Does not take due to family history    Medications: No current facility-administered medications for this encounter.   Current Outpatient Medications  Medication Sig Dispense Refill   Accu-Chek FastClix Lancets MISC Test blood sugars daily Dx E11.9 102 each 3   albuterol  (PROVENTIL ) (2.5 MG/3ML) 0.083% nebulizer solution Take 3 mLs (2.5 mg total) by nebulization every 6 (six) hours as needed for wheezing or shortness of breath. 150 mL 1   albuterol  (VENTOLIN  HFA) 108 (90 Base) MCG/ACT inhaler Inhale 2 puffs into the lungs every 6 (six) hours as needed for wheezing or shortness of breath  (Cough). 18 g 2   Azelastine  HCl 137 MCG/SPRAY SOLN PLACE 1 SPRAY INTO BOTH NOSTRILS 2 (TWO) TIMES DAILY 30 mL 4   Blood Glucose Monitoring Suppl (ONETOUCH VERIO FLEX SYSTEM) w/Device KIT Test blood sugars daily Dx E11.9 1 kit 0   Continuous Glucose Sensor (FREESTYLE LIBRE 3 SENSOR) MISC Use to check blood glucose continously, change sensor every 14 days 2 each PRN   cyclobenzaprine  (FLEXERIL ) 10 MG tablet Take 1 tablet (10 mg total) by mouth 3 (three) times daily as needed for muscle spasms. 30 tablet 1   dexlansoprazole  (DEXILANT ) 60 MG capsule Take 1 capsule (60 mg total) by mouth daily. Has failed: zantac, pepcid, prilosec, protonix , prevacid and nexium. 90 capsule 3   diazepam (VALIUM) 2 MG tablet Take 2 mg by mouth daily as needed.  Diclofenac  Sodium 3 % GEL Apply 4 g topically daily as needed (knee pain). **note SIG change due to strength change 400 g 3   dicyclomine  (BENTYL ) 20 MG tablet Take 1 tablet (20 mg) by mouth every 6 hours as needed for spasms (abdominal cramps). 120 tablet 2   EPINEPHrine  0.3 mg/0.3 mL IJ SOAJ injection Inject 0.3 mg into the muscle as needed for anaphylaxis. 1 each 2   Erenumab -aooe (AIMOVIG ) 140 MG/ML SOAJ Inject 140 mg into the skin every 28 (twenty-eight) days. 1.12 mL 11   FLUoxetine  (PROZAC ) 40 MG capsule Take 1 capsule (40 mg total) by mouth daily. Further fills per psychiatry 30 capsule 0   gabapentin  (NEURONTIN ) 300 MG capsule TAKE 1 CAPSULE BY MOUTH THREE TIMES A DAY (Patient taking differently: Take 300 mg by mouth 4 (four) times daily. 300mg  in the Morning, and 900 at night) 270 capsule 3   glucose blood (ONETOUCH VERIO) test strip Test blood sugars daily Dx E11.9 100 each 3   hydrOXYzine  (ATARAX ) 10 MG tablet Take 1 tablet (10 mg total) by mouth 3 (three) times daily as needed for anxiety. (Patient taking differently: Take 10 mg by mouth 3 (three) times daily as needed for anxiety. 30 mg at bedtime) 270 tablet 1   insulin  degludec (TRESIBA  FLEXTOUCH)  100 UNIT/ML FlexTouch Pen INJECT 50-60 UNITS INTO THE SKIN DAILY. 15 mL 0   Insulin  Pen Needle (B-D UF III MINI PEN NEEDLES) 31G X 5 MM MISC use as directed once daily Dx E11.8 100 each 4   levocetirizine (XYZAL ) 5 MG tablet Take 1 tablet (5 mg total) by mouth every evening. To replace cetirizine  90 tablet 3   levothyroxine  (SYNTHROID ) 125 MCG tablet Take 1 tablet (125 mcg total) by mouth daily. 90 tablet 3   linaclotide  (LINZESS ) 72 MCG capsule Take 1 capsule (72 mcg total) by mouth daily before breakfast. (Patient taking differently: Take 72 mcg by mouth as needed.) 30 capsule 3   lisdexamfetamine (VYVANSE ) 40 MG capsule Take 40 mg by mouth every morning.     meclizine  (ANTIVERT ) 25 MG tablet Take 1 tablet (25 mg total) by mouth 3 (three) times daily as needed for dizziness. 20 tablet 5   Mepolizumab  (NUCALA ) 100 MG/ML SOAJ Inject 1 mL (100 mg total) into the skin every 28 (twenty-eight) days. 3 mL 1   montelukast  (SINGULAIR ) 10 MG tablet TAKE 1 TABLET BY MOUTH EVERYDAY AT BEDTIME 90 tablet 1   nystatin  cream (MYCOSTATIN ) Apply 1 Application topically 2 (two) times daily. x2 weeks 60 g 0   ondansetron  (ZOFRAN -ODT) 4 MG disintegrating tablet Take 1 tablet (4 mg total) by mouth every 8 (eight) hours as needed for nausea or vomiting. 12 tablet 0   PERCOCET 10-325 MG tablet Take 1 tablet by mouth every 8 (eight) hours as needed for pain. Per pain management     pioglitazone  (ACTOS ) 45 MG tablet Take 1 tablet (45 mg total) by mouth daily. 90 tablet 3   Rimegepant Sulfate (NURTEC) 75 MG TBDP Dissolve 1 tablet in mouth at onset of migraine headache. MAX 1 tablet per 24 hours. (Patient taking differently: Take 75 mg by mouth as needed. Dissolve 1 tablet in mouth at onset of migraine headache. MAX 1 tablet per 24 hours.) 10 tablet PRN   rosuvastatin  (CRESTOR ) 40 MG tablet Take 1 tablet (40 mg total) by mouth at bedtime. 90 tablet 3   tirzepatide  (MOUNJARO ) 15 MG/0.5ML Pen Inject 15 mg into the skin once a  week.  6 mL 3    No results found. However, due to the size of the patient record, not all encounters were searched. Please check Results Review for a complete set of results. No results found.  ROS: Pain with rom of the left upper extremity  Physical Exam: Alert and oriented 53 y.o. female in no acute distress Cranial nerves 2-12 intact Cervical spine: full rom with no tenderness, nv intact distally Chest: active breath sounds bilaterally, no wheeze rhonchi or rales Heart: regular rate and rhythm, no murmur Abd: non tender non distended with active bowel sounds Hip is stable with rom  Left shoulder painful rom  Nv intact distally  Assessment/Plan Assessment: left shoulder rotator cuff tear and ac arthrosis  Plan:  Patient will undergo a left shoulder rotator cuff repair by Dr. Kay at [facility] Risks benefits and expectations were discussed with the patient. Patient understand risks, benefits and expectations and wishes to proceed. Preoperative templating of the joint replacement has been completed, documented, and submitted to the Operating Room personnel in order to optimize intra-operative equipment management.   Arvella Fireman PA-C, MPAS [facility] is now [employer] [address], [place], [place] [address] Phone: [phone] [email or web address] Facebook  [employer]

## 2024-01-25 NOTE — Progress Notes (Signed)
 Sent message, via epic in basket, requesting orders in epic from Careers adviser.

## 2024-01-26 ENCOUNTER — Other Ambulatory Visit: Payer: Self-pay

## 2024-01-28 DIAGNOSIS — R069 Unspecified abnormalities of breathing: Secondary | ICD-10-CM | POA: Diagnosis not present

## 2024-01-31 NOTE — Progress Notes (Signed)
 COVID Vaccine received:  []  No [x]  Yes Date of any COVID positive Test in last 90 days: no PCP - Rosina Fielding DO Cardiologist - n/a  Chest x-ray -  EKG -  11/03/23 Epic Stress Test -  ECHO -  Cardiac Cath -   Bowel Prep - [x]  No  []   Yes ______  Pacemaker / ICD device [x]  No []  Yes   Spinal Cord Stimulator:[x]  No []  Yes       History of Sleep Apnea? [x]  No []  Yes   CPAP used?- [x]  No []  Yes    Does the patient monitor blood sugar?          []  No [x]  Yes  []  N/A  Patient has: []  NO Hx DM   []  Pre-DM                 []  DM1  [x]   DM2 Does patient have a [employer] or Dexacom? []  No [x]  Yes   Fasting Blood Sugar Ranges- 100-137 Checks Blood Sugar __6___ times a day  GLP1 agonist / usual dose - Mounjaro   last dose 01/28/24 GLP1 instructions:  SGLT-2 inhibitors / usual dose - no SGLT-2 instructions:   Blood Thinner / Instructions:no Aspirin Instructions:no  Comments:   Activity level: Patient is able to climb a flight of stairs without difficulty; [x]  No CP  []  No SOB,  Patient can perform ADLs without assistance.   Anesthesia review:   Patient denies shortness of breath, fever, cough and chest pain at PAT appointment.  Patient verbalized understanding and agreement to the Pre-Surgical Instructions that were given to them at this PAT appointment. Patient was also educated of the need to review these PAT instructions again prior to his/her surgery.I reviewed the appropriate phone numbers to call if they have any and questions or concerns.

## 2024-01-31 NOTE — Patient Instructions (Addendum)
 SURGICAL WAITING ROOM VISITATION  Patients having surgery or a procedure may have no more than 2 support people in the waiting area - these visitors may rotate.    Children under the age of 25 must have an adult with them who is not the patient.  Visitors with respiratory illnesses are discouraged from visiting and should remain at home.  If the patient needs to stay at the hospital during part of their recovery, the visitor guidelines for inpatient rooms apply. Pre-op nurse will coordinate an appropriate time for 1 support person to accompany patient in pre-op.  This support person may not rotate.    Please refer to the [facility] website for the visitor guidelines for Inpatients (after your surgery is over and you are in a regular room).       Your procedure is scheduled on: 02/09/24   Report to [facility] Main Entrance    Report to admitting at 10:15AM   Call this number if you have problems the morning of surgery [phone]   Do not eat food :After Midnight.   After Midnight you may have the following liquids until 9:30 AM DAY OF SURGERY  Water Non-Citrus Juices (without pulp, NO RED-Apple, White grape, White cranberry) Black Coffee (NO MILK/CREAM OR CREAMERS, sugar ok)  Clear Tea (NO MILK/CREAM OR CREAMERS, sugar ok) regular and decaf                             Plain Jell-O (NO RED)                                           Fruit ices (not with fruit pulp, NO RED)                                     Popsicles (NO RED)                                                               Sports drinks like Gatorade (NO RED)                The day of surgery:  Drink ONE (1) Pre-Surgery G2 at 9:30 AM the morning of surgery. Drink in one sitting. Do not sip.  This drink was given to you during your hospital  pre-op appointment visit. Nothing else to drink after completing the  Pre-Surgery  G2.          If you have questions, please contact your surgeon's office.      Oral Hygiene is also important to reduce your risk of infection.                                    Remember - BRUSH YOUR TEETH THE MORNING OF SURGERY WITH YOUR REGULAR TOOTHPASTE   Stop all vitamins and herbal supplements 7 days before surgery.   Take these medicines the morning of surgery with A SIP OF WATER: Inhaler, nasal spray, dexilant (dexlansoprazole ),Prozac ,gabapentin ,),levothyroxine ,rosuvastatin . Nebulizer if  needed, synthroid    DO NOT TAKE ANY ORAL DIABETIC MEDICATIONS DAY OF YOUR SURGERY Hold Actos  the morning of surgery.  Hold Mounjaro  for at least 7 days prior to surgery.  Bring CPAP mask and tubing day of surgery.                              You may not have any metal on your body including hair pins, jewelry, and body piercing             Do not wear make-up, lotions, powders, perfumes/cologne, or deodorant  Do not wear nail polish including gel and S&S, artificial/acrylic nails, or any other type of covering on natural nails including finger and toenails. If you have artificial nails, gel coating, etc. that needs to be removed by a nail salon please have this removed prior to surgery or surgery may need to be canceled/ delayed if the surgeon/ anesthesia feels like they are unable to be safely monitored.   Do not shave  48 hours prior to surgery.    Do not bring valuables to the hospital. [facility] IS NOT             RESPONSIBLE   FOR VALUABLES.   Contacts, glasses, dentures or bridgework may not be worn into surgery.  DO NOT BRING YOUR HOME MEDICATIONS TO THE HOSPITAL. PHARMACY WILL DISPENSE MEDICATIONS LISTED ON YOUR MEDICATION LIST TO YOU DURING YOUR ADMISSION IN THE HOSPITAL!    Patients discharged on the day of surgery will not be allowed to drive home.  Someone NEEDS to stay with you for the first 24 hours after anesthesia.   Special Instructions: Bring a copy of your healthcare power of attorney and living will documents the day of surgery if you haven't  scanned them before.              Please read over the following fact sheets you were given: IF YOU HAVE QUESTIONS ABOUT YOUR PRE-OP INSTRUCTIONS PLEASE CALL [phone] [facility]   If you received a COVID test during your pre-op visit  it is requested that you wear a mask when out in public, stay away from anyone that may not be feeling well and notify your surgeon if you develop symptoms. If you test positive for Covid or have been in contact with anyone that has tested positive in the last 10 days please notify you surgeon.    [facility] - Preparing for Surgery Before surgery, you can play an important role.  Because skin is not sterile, your skin needs to be as free of germs as possible.  You can reduce the number of germs on your skin by washing with CHG (chlorahexidine gluconate) soap before surgery.  CHG is an antiseptic cleaner which kills germs and bonds with the skin to continue killing germs even after washing. Please DO NOT use if you have an allergy to CHG or antibacterial soaps.  If your skin becomes reddened/irritated stop using the CHG and inform your nurse when you arrive at Short Stay. Do not shave (including legs and underarms) for at least 48 hours prior to the first CHG shower.  You may shave your face/neck.  Please follow these instructions carefully:  1.  Shower with CHG Soap the night before surgery and the  morning of surgery.  2.  If you choose to wash your hair, wash your hair first as usual with your normal  shampoo.  3.  After you shampoo, rinse your hair and body thoroughly to remove the shampoo.                             4.  Use CHG as you would any other liquid soap.  You can apply chg directly to the skin and wash.  Gently with a scrungie or clean washcloth.  5.  Apply the CHG Soap to your body ONLY FROM THE NECK DOWN.   Do   not use on face/ open                           Wound or open sores. Avoid contact with eyes, ears mouth and   genitals (private parts).                        Wash face,  Genitals (private parts) with your normal soap.             6.  Wash thoroughly, paying special attention to the area where your    surgery  will be performed.  7.  Thoroughly rinse your body with warm water from the neck down.  8.  DO NOT shower/wash with your normal soap after using and rinsing off the CHG Soap.                9.  Pat yourself dry with a clean towel.            10.  Wear clean pajamas.            11.  Place clean sheets on your bed the night of your first shower and do not  sleep with pets. Day of Surgery : Do not apply any lotions/deodorants the morning of surgery.  Please wear clean clothes to the hospital/surgery center.  FAILURE TO FOLLOW THESE INSTRUCTIONS MAY RESULT IN THE CANCELLATION OF YOUR SURGERY  Incentive Spirometer   An incentive spirometer is a tool that can help keep your lungs clear and active. This tool measures how well you are filling your lungs with each breath. Taking long deep breaths may help reverse or decrease the chance of developing breathing (pulmonary) problems (especially infection) following: A long period of time when you are unable to move or be active. BEFORE THE PROCEDURE  If the spirometer includes an indicator to show your best effort, your nurse or respiratory therapist will set it to a desired goal. If possible, sit up straight or lean slightly forward. Try not to slouch. Hold the incentive spirometer in an upright position. INSTRUCTIONS FOR USE  Sit on the edge of your bed if possible, or sit up as far as you can in bed or on a chair. Hold the incentive spirometer in an upright position. Breathe out normally. Place the mouthpiece in your mouth and seal your lips tightly around it. Breathe in slowly and as deeply as possible, raising the piston or the ball toward the top of the column. Hold your breath for 3-5 seconds or for as long as possible. Allow the piston or ball to fall to the bottom of the  column. Remove the mouthpiece from your mouth and breathe out normally. Rest for a few seconds and repeat Steps 1 through 7 at least 10 times every 1-2 hours when you are awake. Take your time and take a few normal breaths between deep breaths.  The spirometer may include an indicator to show your best effort. Use the indicator as a goal to work toward during each repetition. After each set of 10 deep breaths, practice coughing to be sure your lungs are clear. If you have an incision (the cut made at the time of surgery), support your incision when coughing by placing a pillow or rolled up towels firmly against it. Once you are able to get out of bed, walk around indoors and cough well. You may stop using the incentive spirometer when instructed by your caregiver.  RISKS AND COMPLICATIONS Take your time so you do not get dizzy or light-headed. If you are in pain, you may need to take or ask for pain medication before doing incentive spirometry. It is harder to take a deep breath if you are having pain. AFTER USE Rest and breathe slowly and easily. It can be helpful to keep track of a log of your progress. Your caregiver can provide you with a simple table to help with this. If you are using the spirometer at home, follow these instructions: SEEK MEDICAL CARE IF:  You are having difficultly using the spirometer. You have trouble using the spirometer as often as instructed. Your pain medication is not giving enough relief while using the spirometer. You develop fever of 100.5 F (38.1 C) or higher. SEEK IMMEDIATE MEDICAL CARE IF:  You cough up bloody sputum that had not been present before. You develop fever of 102 F (38.9 C) or greater. You develop worsening pain at or near the incision site. MAKE SURE YOU:  Understand these instructions. Will watch your condition. Will get help right away if you are not doing well or get worse.  How to Manage Your Diabetes Before and After  Surgery  Why is it important to control my blood sugar before and after surgery? Improving blood sugar levels before and after surgery helps healing and can limit problems. A way of improving blood sugar control is eating a healthy diet by:  Eating less sugar and carbohydrates  Increasing activity/exercise  Talking with your doctor about reaching your blood sugar goals High blood sugars (greater than 180 mg/dL) can raise your risk of infections and slow your recovery, so you will need to focus on controlling your diabetes during the weeks before surgery. Make sure that the doctor who takes care of your diabetes knows about your planned surgery including the date and location.  How do I manage my blood sugar before surgery? Check your blood sugar at least 4 times a day, starting 2 days before surgery, to make sure that the level is not too high or low. Check your blood sugar the morning of your surgery when you wake up and every 2 hours until you get to the Short Stay unit. If your blood sugar is less than 70 mg/dL, you will need to treat for low blood sugar: Do not take insulin . Treat a low blood sugar (less than 70 mg/dL) with  cup of clear juice (cranberry or apple), 4 glucose tablets, OR glucose gel. Recheck blood sugar in 15 minutes after treatment (to make sure it is greater than 70 mg/dL). If your blood sugar is not greater than 70 mg/dL on recheck, call [phone] for further instructions. Report your blood sugar to the short stay nurse when you get to Short Stay.  If you are admitted to the hospital after surgery: Your blood sugar will be checked by the staff and you will probably be given  insulin  after surgery (instead of oral diabetes medicines) to make sure you have good blood sugar levels. The goal for blood sugar control after surgery is 80-180 mg/dL.   WHAT DO I DO ABOUT MY DIABETES MEDICATION?  Do not take oral diabetes medicines (pills) the morning of  surgery.        THE MORNING OF SURGERY,  take only half of your normal dose of insulin   DO NOT TAKE THE FOLLOWING 7 DAYS PRIOR TO SURGERY: Ozempic , Wegovy , Rybelsus  (Semaglutide ), Byetta (exenatide), Bydureon (exenatide ER), Victoza, Saxenda (liraglutide), or Trulicity  (dulaglutide ) Mounjaro  (Tirzepatide ) Adlyxin (Lixisenatide), Polyethylene Glycol Loxenatide.  Patient Signature:  Date:   Nurse Signature:  Date:

## 2024-02-01 ENCOUNTER — Encounter (HOSPITAL_COMMUNITY): Payer: Self-pay

## 2024-02-01 ENCOUNTER — Other Ambulatory Visit: Payer: Self-pay

## 2024-02-01 ENCOUNTER — Encounter (HOSPITAL_COMMUNITY)
Admission: RE | Admit: 2024-02-01 | Discharge: 2024-02-01 | Disposition: A | Discharge status: Home / Self Care | Payer: Medicare (Managed Care) | Source: Ambulatory Visit | Attending: Orthopedic Surgery | Admitting: Orthopedic Surgery

## 2024-02-01 VITALS — BP 131/87 | HR 107 | Temp 98.4°F | Resp 20 | Ht 67.0 in | Wt 270.0 lb

## 2024-02-01 DIAGNOSIS — E119 Type 2 diabetes mellitus without complications: Secondary | ICD-10-CM | POA: Diagnosis not present

## 2024-02-01 DIAGNOSIS — Z01818 Encounter for other preprocedural examination: Secondary | ICD-10-CM | POA: Insufficient documentation

## 2024-02-01 DIAGNOSIS — Z7985 Long-term (current) use of injectable non-insulin antidiabetic drugs: Secondary | ICD-10-CM | POA: Insufficient documentation

## 2024-02-01 LAB — CBC
HCT: 45.1 % (ref 36.0–46.0)
Hemoglobin: 14.4 g/dL (ref 12.0–15.0)
MCH: 28.2 pg (ref 26.0–34.0)
MCHC: 31.9 g/dL (ref 30.0–36.0)
MCV: 88.3 fL (ref 80.0–100.0)
Platelets: 326 K/uL (ref 150–400)
RBC: 5.11 MIL/uL (ref 3.87–5.11)
RDW: 12.7 % (ref 11.5–15.5)
WBC: 9.2 K/uL (ref 4.0–10.5)
nRBC: 0 % (ref 0.0–0.2)

## 2024-02-01 LAB — HEMOGLOBIN A1C
Hgb A1c MFr Bld: 6.6 % — ABNORMAL HIGH (ref 4.8–5.6)
Mean Plasma Glucose: 142.72 mg/dL

## 2024-02-01 LAB — BASIC METABOLIC PANEL WITH GFR
Anion gap: 13 (ref 5–15)
BUN: 20 mg/dL (ref 6–20)
CO2: 25 mmol/L (ref 22–32)
Calcium: 9.4 mg/dL (ref 8.9–10.3)
Chloride: 104 mmol/L (ref 98–111)
Creatinine, Ser: 0.85 mg/dL (ref 0.44–1.00)
GFR, Estimated: >60 mL/min (ref >60)
Glucose, Bld: 145 mg/dL — ABNORMAL HIGH (ref 70–99)
Potassium: 3.9 mmol/L (ref 3.5–5.1)
Sodium: 141 mmol/L (ref 135–145)

## 2024-02-07 ENCOUNTER — Encounter (HOSPITAL_BASED_OUTPATIENT_CLINIC_OR_DEPARTMENT_OTHER): Payer: Self-pay

## 2024-02-07 ENCOUNTER — Ambulatory Visit
Admission: EM | Admit: 2024-02-07 | Discharge: 2024-02-07 | Disposition: A | Discharge status: Home / Self Care | Payer: Medicare (Managed Care)

## 2024-02-07 ENCOUNTER — Emergency Department (HOSPITAL_BASED_OUTPATIENT_CLINIC_OR_DEPARTMENT_OTHER)
Admission: EM | Admit: 2024-02-07 | Discharge: 2024-02-08 | Disposition: A | Discharge status: Home / Self Care | Payer: Medicare (Managed Care) | Source: Ambulatory Visit | Attending: Emergency Medicine | Admitting: Emergency Medicine

## 2024-02-07 ENCOUNTER — Other Ambulatory Visit: Payer: Self-pay

## 2024-02-07 DIAGNOSIS — S1191XA Laceration without foreign body of unspecified part of neck, initial encounter: Secondary | ICD-10-CM | POA: Diagnosis not present

## 2024-02-07 DIAGNOSIS — Z794 Long term (current) use of insulin: Secondary | ICD-10-CM | POA: Insufficient documentation

## 2024-02-07 DIAGNOSIS — S1185XA Open bite of other specified part of neck, initial encounter: Secondary | ICD-10-CM | POA: Diagnosis not present

## 2024-02-07 DIAGNOSIS — S51852A Open bite of left forearm, initial encounter: Secondary | ICD-10-CM | POA: Diagnosis not present

## 2024-02-07 DIAGNOSIS — Z91040 Latex allergy status: Secondary | ICD-10-CM | POA: Insufficient documentation

## 2024-02-07 DIAGNOSIS — E119 Type 2 diabetes mellitus without complications: Secondary | ICD-10-CM | POA: Diagnosis not present

## 2024-02-07 DIAGNOSIS — W540XXA Bitten by dog, initial encounter: Secondary | ICD-10-CM | POA: Diagnosis not present

## 2024-02-07 DIAGNOSIS — R490 Dysphonia: Secondary | ICD-10-CM | POA: Insufficient documentation

## 2024-02-07 DIAGNOSIS — Z23 Encounter for immunization: Secondary | ICD-10-CM | POA: Insufficient documentation

## 2024-02-07 DIAGNOSIS — Z87891 Personal history of nicotine dependence: Secondary | ICD-10-CM | POA: Diagnosis not present

## 2024-02-07 DIAGNOSIS — M40209 Unspecified kyphosis, site unspecified: Secondary | ICD-10-CM | POA: Insufficient documentation

## 2024-02-07 DIAGNOSIS — S1195XA Open bite of unspecified part of neck, initial encounter: Secondary | ICD-10-CM

## 2024-02-07 MED ORDER — TETANUS-DIPHTH-ACELL PERTUSSIS 5-2.5-18.5 LF-MCG/0.5 IM SUSY
0.5000 mL | PREFILLED_SYRINGE | Freq: Once | INTRAMUSCULAR | Status: AC
  Administered 2024-02-07: 0.5 mL via INTRAMUSCULAR
  Filled 2024-02-07: qty 0.5

## 2024-02-07 MED ORDER — AMOXICILLIN-POT CLAVULANATE 875-125 MG PO TABS: 1.0000 | ORAL_TABLET | Freq: Two times a day (BID) | ORAL | 0 refills | Status: AC

## 2024-02-07 MED ORDER — AMOXICILLIN-POT CLAVULANATE 875-125 MG PO TABS
1.0000 | ORAL_TABLET | Freq: Once | ORAL | Status: AC
  Administered 2024-02-07: 1 via ORAL
  Filled 2024-02-07: qty 1

## 2024-02-07 MED ORDER — OXYCODONE HCL 5 MG PO TABS
10.0000 mg | ORAL_TABLET | Freq: Once | ORAL | Status: AC
  Administered 2024-02-07: 10 mg via ORAL
  Filled 2024-02-07: qty 2

## 2024-02-07 MED ORDER — LIDOCAINE-EPINEPHRINE (PF) 2 %-1:200000 IJ SOLN
10.0000 mL | Freq: Once | INTRAMUSCULAR | Status: AC
  Administered 2024-02-07: 10 mL
  Filled 2024-02-07: qty 20

## 2024-02-07 NOTE — ED Provider Notes (Signed)
 Patient complains of dog bite to neck and forearm.  Inspection of wound to left side of neck reveals bite wound deep to fascia.  Due to depth of wound, recommend patient go to the emergency department for further evaluation and treatment.   Joesph Shaver Scales, [place] 02/07/24 [phone]

## 2024-02-07 NOTE — Discharge Instructions (Addendum)
 Will send home with antibiotics.  Please take the complete course.  Recommend washing wounds gently with warm soapy water placing bandages over them changing the bandages at least daily.  Your wound was repaired using nonabsorbable stitches.  This should be removed in around a week.  You may return to urgent care, family doctor or back in the emergency department have the sutures taken out.

## 2024-02-07 NOTE — ED Triage Notes (Signed)
 Arrives ambulatory to the ED with complaints of being bit on her face by her friends dog. Patient also has a bite to her left arm as well.

## 2024-02-07 NOTE — ED Provider Notes (Signed)
 [place] EMERGENCY DEPARTMENT AT [facility] Provider Note   CSN: [id number] Arrival date & time: 02/07/24  1740     Patient presents with: Animal Bite   Judith [name] is a 53 y.o. female.    Animal Bite   53 year old female presents emergency department complaints of dog bite.  States that she was over at her friend's who recently adopted a pit mix.  Dog up-to-date on vaccines.  Patient states that she was bitten in the left forearm as well as beneath her left chin on her neck.  Was seen urgent care and told to come to emergency department due to neck wound.  Patient unaware of her last Tdap and would like it updated.  States that the area is painful.  Past medical history significant for GERD, migraine, hyperlipidemia, asthma, bipolar 1 disorder, gastroparesis, diabetes mellitus, ADD  Prior to Admission medications   Medication Sig Start Date End Date Taking? Authorizing Provider  Accu-Chek FastClix Lancets MISC Test blood sugars daily Dx E11.9 11/27/21   Jolinda Potter M, DO  albuterol  (PROVENTIL ) (2.5 MG/3ML) 0.083% nebulizer solution Take 3 mLs (2.5 mg total) by nebulization every 6 (six) hours as needed for wheezing or shortness of breath. 06/07/23   Jolinda Potter HERO, DO  albuterol  (VENTOLIN  HFA) 108 (90 Base) MCG/ACT inhaler Inhale 2 puffs into the lungs every 6 (six) hours as needed for wheezing or shortness of breath (Cough). 06/06/23   Joesph Shaver Scales, PA-C  Azelastine  HCl 137 MCG/SPRAY SOLN PLACE 1 SPRAY INTO BOTH NOSTRILS 2 (TWO) TIMES DAILY 11/22/23   Jolinda Potter M, DO  Blood Glucose Monitoring [facility] VERIO FLEX SYSTEM) w/Device KIT Test blood sugars daily Dx E11.9 01/06/24   Jolinda Potter M, DO  celecoxib (CELEBREX) 100 MG capsule Take 100 mg by mouth 2 (two) times daily. 01/22/24   [provider]  Continuous Glucose Sensor (FREESTYLE LIBRE 3 SENSOR) MISC Use to check blood glucose continously, change sensor every 14 days 06/22/23    Jolinda Potter M, DO  cyclobenzaprine  (FLEXERIL ) 10 MG tablet Take 1 tablet (10 mg total) by mouth 3 (three) times daily as needed for muscle spasms. 06/04/23   Jolinda Potter HERO, DO  CYMBALTA 30 MG capsule Take 30 mg by mouth at bedtime. 01/18/24   [provider]  dexlansoprazole  (DEXILANT ) 60 MG capsule Take 1 capsule (60 mg total) by mouth daily. Has failed: zantac, pepcid, prilosec, protonix , prevacid and nexium. 08/20/23   Jolinda Potter HERO, DO  diazepam (VALIUM) 2 MG tablet Take 2 mg by mouth daily as needed for anxiety. 11/02/23   [provider]  Diclofenac  Sodium 3 % GEL Apply 4 g topically daily as needed (knee pain). **note SIG change due to strength change 01/24/24   Jolinda Potter HERO, DO  dicyclomine  (BENTYL ) 20 MG tablet Take 1 tablet (20 mg) by mouth every 6 hours as needed for spasms (abdominal cramps). 06/03/23   Jolinda Potter HERO, DO  EPINEPHrine  0.3 mg/0.3 mL IJ SOAJ injection Inject 0.3 mg into the muscle as needed for anaphylaxis. 11/25/22   Hunsucker, Donnice SAUNDERS, MD  Erenumab -aooe (AIMOVIG ) 140 MG/ML SOAJ Inject 140 mg into the skin every 28 (twenty-eight) days. 11/29/23   Skeet Juliene SAUNDERS, DO  FLUoxetine  (PROZAC ) 40 MG capsule Take 1 capsule (40 mg total) by mouth daily. Further fills per psychiatry 10/15/23   Jolinda Potter HERO, DO  gabapentin  (NEURONTIN ) 300 MG capsule TAKE 1 CAPSULE BY MOUTH THREE TIMES A DAY Patient taking differently: Take  300-600 mg by mouth See admin instructions. Take 300 mg by mouth in the morning and take 600 at night 11/22/23   Jolinda Potter M, DO  glucose blood [facility] VERIO) test strip Test blood sugars daily Dx E11.9 01/06/24   Jolinda Potter M, DO  hydrOXYzine  (ATARAX ) 10 MG tablet Take 1 tablet (10 mg total) by mouth 3 (three) times daily as needed for anxiety. Patient taking differently: Take 20-30 mg by mouth at bedtime. 06/30/23   Jolinda Potter HERO, DO  insulin  degludec (TRESIBA  FLEXTOUCH) 100 UNIT/ML FlexTouch Pen INJECT  50-60 UNITS INTO THE SKIN DAILY. Patient taking differently: Inject 60-100 Units into the skin daily as needed. 12/27/23   Jolinda Potter HERO, DO  Insulin  Pen Needle (B-D UF III MINI PEN NEEDLES) 31G X 5 MM MISC use as directed once daily Dx E11.8 04/12/23   Jolinda Potter M, DO  levocetirizine (XYZAL ) 5 MG tablet Take 1 tablet (5 mg total) by mouth every evening. To replace cetirizine  01/24/24   Jolinda Potter M, DO  levothyroxine  (SYNTHROID ) 125 MCG tablet Take 1 tablet (125 mcg total) by mouth daily. 08/20/23   Jolinda Potter HERO, DO  lidocaine  (LIDODERM ) 5 % Place 1 patch onto the skin daily as needed (PAIN).    [provider]  linaclotide  (LINZESS ) 72 MCG capsule Take 1 capsule (72 mcg total) by mouth daily before breakfast. Patient taking differently: Take 72 mcg by mouth daily as needed (constipation). 02/10/22   Jolinda Potter HERO, DO  lisdexamfetamine (VYVANSE ) 70 MG capsule Take 70 mg by mouth daily.    [provider]  meclizine  (ANTIVERT ) 25 MG tablet Take 1 tablet (25 mg total) by mouth 3 (three) times daily as needed for dizziness. 11/29/23   Skeet Juliene SAUNDERS, DO  Melatonin 10 MG TABS Take 10 mg by mouth at bedtime.    [provider]  meloxicam  (MOBIC ) 7.5 MG tablet Take 7.5 mg by mouth 2 (two) times daily.    [provider]  Mepolizumab  (NUCALA ) 100 MG/ML SOAJ Inject 1 mL (100 mg total) into the skin every 28 (twenty-eight) days. 01/05/24   Hunsucker, Donnice SAUNDERS, MD  montelukast  (SINGULAIR ) 10 MG tablet TAKE 1 TABLET BY MOUTH EVERYDAY AT BEDTIME 07/06/23   Jolinda Potter M, DO  nystatin  cream (MYCOSTATIN ) Apply 1 Application topically 2 (two) times daily. x2 weeks Patient taking differently: Apply 1 Application topically 2 (two) times daily as needed (irritation/rash). 05/06/23   Hermanns, Ashlee P, PA-C  ondansetron  (ZOFRAN -ODT) 4 MG disintegrating tablet Take 1 tablet (4 mg total) by mouth every 8 (eight) hours as needed for nausea or vomiting.  05/06/23   Hermanns, Ashlee P, PA-C  PERCOCET 10-325 MG tablet Take 1 tablet by mouth every 8 (eight) hours as needed for pain. Per pain management 06/26/23   [provider]  pioglitazone  (ACTOS ) 45 MG tablet Take 1 tablet (45 mg total) by mouth daily. 08/20/23   Gottschalk, Potter M, DO  REXULTI 2 MG TABS tablet Take 2 mg by mouth daily.    [provider]  Rimegepant Sulfate (NURTEC) 75 MG TBDP Dissolve 1 tablet in mouth at onset of migraine headache. MAX 1 tablet per 24 hours. 08/20/23   Jolinda Potter HERO, DO  rosuvastatin  (CRESTOR ) 40 MG tablet Take 1 tablet (40 mg total) by mouth at bedtime. 08/20/23   Jolinda Potter HERO, DO  tirzepatide  (MOUNJARO ) 15 MG/0.5ML Pen Inject 15 mg into the skin once a week. 07/09/23   Jolinda Potter HERO, DO  Allergies: Diphenhydramine , Keflex  [cephalexin ], Morphine , Prednisone, Adhesive [tape], Erythromycin, Latex, Metformin and related, and Sulfonamide derivatives    Review of Systems  All other systems reviewed and are negative.   Updated Vital Signs BP 129/89 (BP Location: Right Arm)   Pulse (!) 102   Temp 98.1 F (36.7 C) (Temporal)   Resp 18   Ht 5' 7 (1.702 m)   Wt 122.5 kg   SpO2 100%   BMI 42.30 kg/m   Physical Exam Vitals and nursing note reviewed.  Constitutional:      General: She is not in acute distress.    Appearance: She is well-developed.  HENT:     Head: Normocephalic and atraumatic.     Comments: The knee still left shin on anterior neck, 5.2 C shaped laceration with exposed fat.  No deep structure involvement.  No active bleeding at this time.  No obvious bruising or swelling elsewhere on neck. Eyes:     Conjunctiva/sclera: Conjunctivae normal.  Cardiovascular:     Rate and Rhythm: Normal rate and regular rhythm.     Heart sounds: No murmur heard. Pulmonary:     Effort: Pulmonary effort is normal. No respiratory distress.     Breath sounds: Normal breath sounds.  Abdominal:     Palpations: Abdomen is  soft.     Tenderness: There is no abdominal tenderness.  Musculoskeletal:        General: No swelling.     Cervical back: Neck supple.     Comments: Ecchymosis appreciated to dorsal aspect of left forearm with superficial abrasions present.  1 puncture wound on the left forearm measuring 1.2 cm in length.  No obvious foreign body present.  Able to range bilateral upper lower extremities without difficulty.  Skin:    General: Skin is warm and dry.     Capillary Refill: Capillary refill takes less than 2 seconds.  Neurological:     Mental Status: She is alert.  Psychiatric:        Mood and Affect: Mood normal.     (all labs ordered are listed, but only abnormal results are displayed) Labs Reviewed - No data to display  EKG: None  Radiology: No results found.   .Laceration Repair  Date/Time: 02/07/2024 6:16 PM  Performed by: Silver Wonda LABOR, PA Authorized by: Silver Wonda LABOR, PA   Consent:    Consent obtained:  Verbal   Consent given by:  Patient   Risks, benefits, and alternatives were discussed: yes     Risks discussed:  Infection, nerve damage, need for additional repair, pain, poor wound healing, poor cosmetic result, retained foreign body, tendon damage and vascular damage   Alternatives discussed:  No treatment, delayed treatment and observation Universal protocol:    Procedure explained and questions answered to patient or proxy's satisfaction: yes     Relevant documents present and verified: yes     Test results available: yes     Patient identity confirmed:  Verbally with patient Anesthesia:    Anesthesia method:  Local infiltration   Local anesthetic:  Lidocaine  2% WITH epi Laceration details:    Location:  Neck   Neck location:  L anterior   Length (cm):  5.2 Pre-procedure details:    Preparation:  Patient was prepped and draped in usual sterile fashion Exploration:    Limited defect created (wound extended): no     Imaging outcome: foreign body not  noted     Wound exploration: wound explored through full range of  motion and entire depth of wound visualized     Contaminated: no   Treatment:    Area cleansed with:  Saline and povidone-iodine   Amount of cleaning:  Standard   Irrigation solution:  Sterile water   Irrigation volume:  1000cc   Irrigation method:  Pressure wash   Visualized foreign bodies/material removed: no     Debridement:  None   Undermining:  None   Scar revision: no   Skin repair:    Repair method:  Sutures   Suture size:  5-0   Suture material:  Prolene   Suture technique:  Simple interrupted   Number of sutures:  7 Approximation:    Approximation:  Close Repair type:    Repair type:  Simple Post-procedure details:    Dressing:  Open (no dressing)   Procedure completion:  Tolerated well, no immediate complications    Medications Ordered in the ED - No data to display                                  Medical Decision Making Risk Prescription drug management.   This patient presents to the ED for concern of dog bite, this involves an extensive number of treatment options, and is a complaint that carries with it a high risk of complications and morbidity.  The differential diagnosis includes laceration, foreign body retainment, ligament/tendinous injury, compartment syndrome, other   Co morbidities that complicate the patient evaluation  See HPI   Additional history obtained:  Additional history obtained from EMR External records from outside source obtained and reviewed including hospital records   Lab Tests:  N/a   Imaging Studies ordered:  N/a   Cardiac Monitoring: / EKG:  N/a   Consultations Obtained:  N/a   Problem List / ED Course / Critical interventions / Medication management  Dog bite I ordered medication including lidocaine  with epinephrine , oxycodone , Tdap   Reevaluation of the patient after these medicines showed that the patient improved I have reviewed  the patients home medicines and have made adjustments as needed   Social Determinants of Health:  Former cigarette use.  Denies illicit drug use.   Test / Admission - Considered:  Dog bite Vitals signs significant for tachycardia. Otherwise within normal range and stable throughout visit.  53 year old female presents emergency department complaints of dog bite.  States that she was over at her friend's who recently adopted a pit mix.  Dog up-to-date on vaccines.  Patient states that she was bitten in the left forearm as well as beneath her left chin on her neck.  Was seen urgent care and told to come to emergency department due to neck wound.  Patient unaware of her last Tdap and would like it updated.  States that the area is painful. On exam, ecchymosis appreciated to the dorsal aspect of left proximal forearm with abrasions present 1 puncture wound.  Patient also with laceration to left neck just beneath chin with subcutaneous fat exposed but no deep structure involvement.  Laceration on left neck repaired in manner as above.  Patient given 1 dose of antibiotic in the emergency department.  Recommend wound care at home, follow-up with primary care for reassessment.  Will place patient empirically on antibiotics.  Treatment plan discussed with patient she acknowledged understanding was agreeable.  Patient well-appearing, afebrile in no acute distress upon discharge. Worrisome signs and symptoms were discussed with the patient, and  the patient acknowledged understanding to return to the ED if noticed. Patient was stable upon discharge.       Final diagnoses:  None    ED Discharge Orders     None          Silver Wonda LABOR, [place] 02/07/24 JUDITHANN Geraldene Hamilton, MD 02/08/24 2322

## 2024-02-07 NOTE — ED Notes (Signed)
 Patient is being discharged from the Urgent Care and sent to the Emergency Department via POV . Per Joesph Shaver Scales, PA-C, patient is in need of higher level of care due to having a neck laceration/dog bite. Patient is aware and verbalizes understanding of plan of care.  Vitals:   02/07/24 1650  BP: 126/85  Pulse: (!) 101  Resp: 20  Temp: 98.5 F (36.9 C)  SpO2: 95%

## 2024-02-07 NOTE — ED Notes (Addendum)
 After vitals taken and assessing patient's neck laceration, the provider was informed and assessed the patient.

## 2024-02-07 NOTE — ED Triage Notes (Signed)
 [name] significant other states she was bitten by a friend's dog today. She was but on her left upper arm and her left neck.

## 2024-02-08 NOTE — Progress Notes (Signed)
 Judith [name] called to make us  aware that she was bitten by a dog on the left side of her neck on 02/07/24 and received 7 stitches. I called and left a voicemail for with [facility] because [name] was out of the office today.

## 2024-02-09 ENCOUNTER — Other Ambulatory Visit: Payer: Self-pay

## 2024-02-09 ENCOUNTER — Ambulatory Visit (HOSPITAL_COMMUNITY): Payer: Self-pay | Admitting: Anesthesiology

## 2024-02-09 ENCOUNTER — Encounter (HOSPITAL_COMMUNITY)
Admission: RE | Disposition: A | Discharge status: Home / Self Care | Payer: Self-pay | Source: Home / Self Care | Attending: Orthopedic Surgery

## 2024-02-09 ENCOUNTER — Ambulatory Visit (HOSPITAL_COMMUNITY): Payer: Medicare (Managed Care) | Admitting: Physician Assistant

## 2024-02-09 ENCOUNTER — Ambulatory Visit (HOSPITAL_COMMUNITY)
Admission: RE | Admit: 2024-02-09 | Discharge: 2024-02-09 | Disposition: A | Discharge status: Home / Self Care | Payer: Medicare (Managed Care) | Attending: Orthopedic Surgery | Admitting: Orthopedic Surgery

## 2024-02-09 ENCOUNTER — Encounter (HOSPITAL_COMMUNITY): Payer: Self-pay | Admitting: Orthopedic Surgery

## 2024-02-09 DIAGNOSIS — E66813 Obesity, class 3: Secondary | ICD-10-CM | POA: Diagnosis not present

## 2024-02-09 DIAGNOSIS — Z6841 Body Mass Index (BMI) 40.0 and over, adult: Secondary | ICD-10-CM | POA: Diagnosis not present

## 2024-02-09 DIAGNOSIS — M75112 Incomplete rotator cuff tear or rupture of left shoulder, not specified as traumatic: Secondary | ICD-10-CM | POA: Diagnosis not present

## 2024-02-09 DIAGNOSIS — G4730 Sleep apnea, unspecified: Secondary | ICD-10-CM | POA: Diagnosis not present

## 2024-02-09 DIAGNOSIS — Z79891 Long term (current) use of opiate analgesic: Secondary | ICD-10-CM | POA: Insufficient documentation

## 2024-02-09 DIAGNOSIS — Z7985 Long-term (current) use of injectable non-insulin antidiabetic drugs: Secondary | ICD-10-CM | POA: Diagnosis not present

## 2024-02-09 DIAGNOSIS — E039 Hypothyroidism, unspecified: Secondary | ICD-10-CM | POA: Diagnosis not present

## 2024-02-09 DIAGNOSIS — K3184 Gastroparesis: Secondary | ICD-10-CM | POA: Insufficient documentation

## 2024-02-09 DIAGNOSIS — G8918 Other acute postprocedural pain: Secondary | ICD-10-CM | POA: Diagnosis not present

## 2024-02-09 DIAGNOSIS — Z794 Long term (current) use of insulin: Secondary | ICD-10-CM | POA: Insufficient documentation

## 2024-02-09 DIAGNOSIS — F319 Bipolar disorder, unspecified: Secondary | ICD-10-CM | POA: Diagnosis not present

## 2024-02-09 DIAGNOSIS — Z7984 Long term (current) use of oral hypoglycemic drugs: Secondary | ICD-10-CM | POA: Insufficient documentation

## 2024-02-09 DIAGNOSIS — M1990 Unspecified osteoarthritis, unspecified site: Secondary | ICD-10-CM | POA: Diagnosis not present

## 2024-02-09 DIAGNOSIS — E1143 Type 2 diabetes mellitus with diabetic autonomic (poly)neuropathy: Secondary | ICD-10-CM | POA: Insufficient documentation

## 2024-02-09 DIAGNOSIS — Z87891 Personal history of nicotine dependence: Secondary | ICD-10-CM | POA: Diagnosis not present

## 2024-02-09 DIAGNOSIS — M19012 Primary osteoarthritis, left shoulder: Secondary | ICD-10-CM | POA: Diagnosis not present

## 2024-02-09 DIAGNOSIS — E119 Type 2 diabetes mellitus without complications: Secondary | ICD-10-CM

## 2024-02-09 DIAGNOSIS — F419 Anxiety disorder, unspecified: Secondary | ICD-10-CM | POA: Insufficient documentation

## 2024-02-09 HISTORY — PX: RESECTION DISTAL CLAVICAL: SHX5053

## 2024-02-09 HISTORY — PX: SUBACROMIAL DECOMPRESSION: SHX5174

## 2024-02-09 HISTORY — PX: SHOULDER ARTHROSCOPY WITH OPEN ROTATOR CUFF REPAIR: SHX6092

## 2024-02-09 LAB — GLUCOSE, CAPILLARY
Glucose-Capillary: 128 mg/dL — ABNORMAL HIGH (ref 70–99)
Glucose-Capillary: 136 mg/dL — ABNORMAL HIGH (ref 70–99)

## 2024-02-09 SURGERY — ARTHROSCOPY, SHOULDER WITH REPAIR, ROTATOR CUFF, OPEN
Anesthesia: Regional | Laterality: Left

## 2024-02-09 MED ORDER — BUPIVACAINE LIPOSOME 1.3 % IJ SUSP
INTRAMUSCULAR | Status: DC | PRN
  Administered 2024-02-09: 10 mL via PERINEURAL

## 2024-02-09 MED ORDER — BUPIVACAINE-EPINEPHRINE 0.25% -1:200000 IJ SOLN
INTRAMUSCULAR | Status: DC | PRN
  Administered 2024-02-09: 13 mL

## 2024-02-09 MED ORDER — LACTATED RINGERS IV SOLN: INTRAVENOUS | Status: DC

## 2024-02-09 MED ORDER — CHLORHEXIDINE GLUCONATE 0.12 % MT SOLN
15.0000 mL | Freq: Once | OROMUCOSAL | Status: AC
  Administered 2024-02-09: 15 mL via OROMUCOSAL

## 2024-02-09 MED ORDER — BUPIVACAINE HCL (PF) 0.5 % IJ SOLN
INTRAMUSCULAR | Status: AC
Start: 2024-02-09 — End: 2024-02-09
  Filled 2024-02-09: qty 30

## 2024-02-09 MED ORDER — BUPIVACAINE-EPINEPHRINE (PF) 0.25% -1:200000 IJ SOLN
INTRAMUSCULAR | Status: AC
  Filled 2024-02-09: qty 30

## 2024-02-09 MED ORDER — SODIUM CHLORIDE 0.9 % IR SOLN
Status: DC | PRN
  Administered 2024-02-09 (×2): 3000 mL

## 2024-02-09 MED ORDER — ACETAMINOPHEN 500 MG PO TABS
1000.0000 mg | ORAL_TABLET | Freq: Once | ORAL | Status: DC
  Filled 2024-02-09: qty 2

## 2024-02-09 MED ORDER — ORAL CARE MOUTH RINSE: 15.0000 mL | Freq: Once | OROMUCOSAL | Status: AC

## 2024-02-09 MED ORDER — FENTANYL CITRATE (PF) 100 MCG/2ML IJ SOLN
INTRAMUSCULAR | Status: AC
  Filled 2024-02-09: qty 2

## 2024-02-09 MED ORDER — ROCURONIUM BROMIDE 100 MG/10ML IV SOLN
INTRAVENOUS | Status: DC | PRN
  Administered 2024-02-09: 60 mg via INTRAVENOUS

## 2024-02-09 MED ORDER — BUPIVACAINE HCL (PF) 0.5 % IJ SOLN
INTRAMUSCULAR | Status: DC | PRN
  Administered 2024-02-09: 15 mL via PERINEURAL

## 2024-02-09 MED ORDER — PHENYLEPHRINE HCL-NACL 20-0.9 MG/250ML-% IV SOLN
INTRAVENOUS | Status: DC | PRN
  Administered 2024-02-09: 25 ug/min via INTRAVENOUS

## 2024-02-09 MED ORDER — PHENYLEPHRINE 80 MCG/ML (10ML) SYRINGE FOR IV PUSH (FOR BLOOD PRESSURE SUPPORT)
PREFILLED_SYRINGE | INTRAVENOUS | Status: DC | PRN
  Administered 2024-02-09: 80 ug via INTRAVENOUS
  Administered 2024-02-09: 160 ug via INTRAVENOUS
  Administered 2024-02-09: 80 ug via INTRAVENOUS

## 2024-02-09 MED ORDER — ONDANSETRON HCL 4 MG/2ML IJ SOLN
INTRAMUSCULAR | Status: DC | PRN
  Administered 2024-02-09: 4 mg via INTRAVENOUS

## 2024-02-09 MED ORDER — SODIUM CHLORIDE 0.9 % IR SOLN
Status: DC | PRN
  Administered 2024-02-09: 1000 mL

## 2024-02-09 MED ORDER — FENTANYL CITRATE PF 50 MCG/ML IJ SOSY
100.0000 ug | PREFILLED_SYRINGE | INTRAMUSCULAR | Status: DC
  Filled 2024-02-09: qty 2

## 2024-02-09 MED ORDER — PROPOFOL 10 MG/ML IV BOLUS
INTRAVENOUS | Status: DC | PRN
  Administered 2024-02-09: 150 mg via INTRAVENOUS

## 2024-02-09 MED ORDER — CEFAZOLIN SODIUM-DEXTROSE 3-4 GM/150ML-% IV SOLN
3.0000 g | Freq: Once | INTRAVENOUS | Status: AC
  Administered 2024-02-09: 3 g via INTRAVENOUS
  Filled 2024-02-09: qty 150

## 2024-02-09 MED ORDER — PROPOFOL 10 MG/ML IV BOLUS
INTRAVENOUS | Status: AC
  Filled 2024-02-09: qty 20

## 2024-02-09 MED ORDER — DEXAMETHASONE SODIUM PHOSPHATE 4 MG/ML IJ SOLN
INTRAMUSCULAR | Status: DC | PRN
  Administered 2024-02-09: 4 mg via INTRAVENOUS

## 2024-02-09 MED ORDER — HYDROMORPHONE HCL 2 MG PO TABS: 2.0000 mg | ORAL_TABLET | ORAL | 0 refills | Status: AC | PRN

## 2024-02-09 MED ORDER — DROPERIDOL 2.5 MG/ML IJ SOLN
INTRAMUSCULAR | Status: AC
  Filled 2024-02-09: qty 2

## 2024-02-09 MED ORDER — ACETAMINOPHEN 10 MG/ML IV SOLN
INTRAVENOUS | Status: DC | PRN
  Administered 2024-02-09: 1000 mg via INTRAVENOUS

## 2024-02-09 MED ORDER — MIDAZOLAM HCL 2 MG/2ML IJ SOLN
INTRAMUSCULAR | Status: AC
  Filled 2024-02-09: qty 2

## 2024-02-09 MED ORDER — MIDAZOLAM HCL 2 MG/2ML IJ SOLN
2.0000 mg | INTRAMUSCULAR | Status: DC
  Administered 2024-02-09: 2 mg via INTRAVENOUS
  Filled 2024-02-09: qty 2

## 2024-02-09 MED ORDER — LIDOCAINE HCL (PF) 1 % IJ SOLN
INTRAMUSCULAR | Status: AC
Start: 2024-02-09 — End: 2024-02-09
  Filled 2024-02-09: qty 30

## 2024-02-09 MED ORDER — SODIUM BICARBONATE 4.2 % IV SOLN
50.0000 meq | Freq: Once | INTRAVENOUS | Status: DC
  Filled 2024-02-09: qty 100

## 2024-02-09 MED ORDER — VANCOMYCIN HCL IN DEXTROSE 1-5 GM/200ML-% IV SOLN: 1000.0000 mg | INTRAVENOUS | Status: DC

## 2024-02-09 MED ORDER — VANCOMYCIN HCL 1500 MG/300ML IV SOLN
1500.0000 mg | INTRAVENOUS | Status: DC
  Filled 2024-02-09: qty 300

## 2024-02-09 MED ORDER — DROPERIDOL 2.5 MG/ML IJ SOLN
0.6250 mg | Freq: Once | INTRAMUSCULAR | Status: AC
  Administered 2024-02-09: 0.625 mg via INTRAVENOUS

## 2024-02-09 MED ORDER — INSULIN ASPART 100 UNIT/ML IJ SOLN: 0.0000 [IU] | INTRAMUSCULAR | Status: DC | PRN

## 2024-02-09 MED ORDER — SUGAMMADEX SODIUM 200 MG/2ML IV SOLN
INTRAVENOUS | Status: DC | PRN
  Administered 2024-02-09: 200 mg via INTRAVENOUS

## 2024-02-09 SURGICAL SUPPLY — 71 items
ANCHOR SUT FBRTK 2.6 SOFT 1.7 (Anchor) IMPLANT
BAG COUNTER SPONGE SURGICOUNT (BAG) ×1 IMPLANT
BAG ZIPLOCK 12X15 (MISCELLANEOUS) ×1 IMPLANT
BLADE AVERAGE 25X9 (BLADE) IMPLANT
BLADE SURG SZ11 CARB STEEL (BLADE) ×1 IMPLANT
BNDG GAUZE DERMACEA FLUFF 4 (GAUZE/BANDAGES/DRESSINGS) ×1 IMPLANT
BURR OVAL 8 FLU 4.0X13 (MISCELLANEOUS) ×1 IMPLANT
CLSR STERI-STRIP ANTIMIC 1/2X4 (GAUZE/BANDAGES/DRESSINGS) IMPLANT
COVER SURGICAL LIGHT HANDLE (MISCELLANEOUS) ×1 IMPLANT
CUFF TOURN SGL QUICK 18X4 (TOURNIQUET CUFF) ×1 IMPLANT
CUTTER BONE 4.0MM X 13CM (MISCELLANEOUS) ×1 IMPLANT
DRAPE IMP U-DRAPE 54X76 (DRAPES) ×1 IMPLANT
DRAPE INCISE IOBAN 66X45 STRL (DRAPES) ×1 IMPLANT
DRAPE POUCH INSTRU U-SHP 10X18 (DRAPES) ×1 IMPLANT
DRAPE STERI 35X30 U-POUCH (DRAPES) ×1 IMPLANT
DRAPE SURG 17X11 SM STRL (DRAPES) ×1 IMPLANT
DRAPE SURG ORHT 6 SPLT 77X108 (DRAPES) ×2 IMPLANT
DRAPE U-SHAPE 47X51 STRL (DRAPES) ×1 IMPLANT
DRSG EMULSION OIL 3X3 NADH (GAUZE/BANDAGES/DRESSINGS) ×2 IMPLANT
DURAPREP 26ML APPLICATOR (WOUND CARE) ×1 IMPLANT
ELECT NDL TIP 2.8 STRL (NEEDLE) ×1 IMPLANT
ELECT NEEDLE TIP 2.8 STRL (NEEDLE) ×1 IMPLANT
ELECT PENCIL ROCKER SW 15FT (MISCELLANEOUS) ×1 IMPLANT
ELECT REM PT RETURN 15FT ADLT (MISCELLANEOUS) ×1 IMPLANT
GAUZE PAD ABD 8X10 STRL (GAUZE/BANDAGES/DRESSINGS) ×1 IMPLANT
GAUZE SPONGE 4X4 12PLY STRL (GAUZE/BANDAGES/DRESSINGS) ×1 IMPLANT
GLOVE BIOGEL PI IND STRL 7.5 (GLOVE) ×1 IMPLANT
GLOVE BIOGEL PI IND STRL 8.5 (GLOVE) ×1 IMPLANT
GLOVE ORTHO TXT STRL SZ7.5 (GLOVE) ×1 IMPLANT
GLOVE SURG ORTHO 8.5 STRL (GLOVE) ×1 IMPLANT
GOWN STRL REUS W/ TWL XL LVL3 (GOWN DISPOSABLE) ×2 IMPLANT
KIT BASIN OR (CUSTOM PROCEDURE TRAY) ×1 IMPLANT
KIT JUGGERKNOT DISP 2.9MM (KITS) IMPLANT
KIT TURNOVER KIT A (KITS) ×1 IMPLANT
MANIFOLD NEPTUNE II (INSTRUMENTS) ×1 IMPLANT
NDL HYPO 25X1 1.5 SAFETY (NEEDLE) ×1 IMPLANT
NDL MAYO 6 CRC TAPER PT (NEEDLE) IMPLANT
NDL MAYO CATGUT SZ4 TPR NDL (NEEDLE) IMPLANT
NDL TAPERED W/ NITINOL LOOP (MISCELLANEOUS) IMPLANT
NEEDLE HYPO 25X1 1.5 SAFETY (NEEDLE) ×1 IMPLANT
NEEDLE MAYO 6 CRC TAPER PT (NEEDLE) ×1 IMPLANT
NEEDLE MAYO CATGUT SZ4 (NEEDLE) IMPLANT
NEEDLE TAPERED W/ NITINOL LOOP (MISCELLANEOUS) IMPLANT
NS IRRIG 1000ML POUR BTL (IV SOLUTION) ×1 IMPLANT
PACK ORTHO EXTREMITY (CUSTOM PROCEDURE TRAY) ×1 IMPLANT
PACK SHOULDER (CUSTOM PROCEDURE TRAY) ×1 IMPLANT
PAD ARMBOARD POSITIONER FOAM (MISCELLANEOUS) ×2 IMPLANT
PENCIL SMOKE EVACUATOR (MISCELLANEOUS) IMPLANT
PROTECTOR NERVE ULNAR (MISCELLANEOUS) ×1 IMPLANT
RESTRAINT HEAD UNIVERSAL NS (MISCELLANEOUS) IMPLANT
SET JUGGERKNOT DISP 1.4MM (SET/KITS/TRAYS/PACK) IMPLANT
SLING ARM FOAM STRAP LRG (SOFTGOODS) ×1 IMPLANT
SLING ARM FOAM STRAP MED (SOFTGOODS) IMPLANT
SLING ULTRA II L (ORTHOPEDIC SUPPLIES) IMPLANT
SPONGE T-LAP 4X18 ~~LOC~~+RFID (SPONGE) IMPLANT
STRIP CLOSURE SKIN 1/2X4 (GAUZE/BANDAGES/DRESSINGS) ×1 IMPLANT
SUCTION TUBE FRAZIER 10FR DISP (SUCTIONS) IMPLANT
SUT BONE WAX W31G (SUTURE) ×1 IMPLANT
SUT ETHILON 4 0 PS 2 18 (SUTURE) ×1 IMPLANT
SUT MNCRL AB 4-0 PS2 18 (SUTURE) ×1 IMPLANT
SUT VIC AB 0 CT1 27XBRD ANBCTR (SUTURE) IMPLANT
SUT VIC AB 0 CT1 36 (SUTURE) ×1 IMPLANT
SUT VIC AB 0 CT2 27 (SUTURE) ×1 IMPLANT
SUT VIC AB 2-0 CT1 TAPERPNT 27 (SUTURE) IMPLANT
SUTURE FIBERWR #2 38 T-5 BLUE (SUTURE) ×2 IMPLANT
SYR CONTROL 10ML LL (SYRINGE) ×1 IMPLANT
TOWEL OR 17X26 10 PK STRL BLUE (TOWEL DISPOSABLE) ×2 IMPLANT
TUBING ARTHROSCOPY IRRIG 16FT (MISCELLANEOUS) ×1 IMPLANT
TUBING CONNECTING 10 (TUBING) ×1 IMPLANT
WAND ABLATOR APOLLO I90 (BUR) ×1 IMPLANT
WATER STERILE IRR 1000ML POUR (IV SOLUTION) ×1 IMPLANT

## 2024-02-09 NOTE — Interval H&P Note (Signed)
 History and Physical Interval Note:  02/09/2024 12:18 PM  Judith [name]  has presented today for surgery, with the diagnosis of Incomplete tear of left rotator cuff, unspecified whether traumatic.  The various methods of treatment have been discussed with the patient and family. After consideration of risks, benefits and other options for treatment, the patient has consented to  Procedure(s): ARTHROSCOPY, SHOULDER WITH REPAIR, ROTATOR CUFF, OPEN (Left) DECOMPRESSION, SUBACROMIAL SPACE (Left) EXCISION, CLAVICLE, DISTAL, OPEN (Left) CARPAL TUNNEL RELEASE (Left) as a surgical intervention.  The patient's history has been reviewed, patient examined, no change in status, stable for surgery.  I have reviewed the patient's chart and labs.  Questions were answered to the patient's satisfaction.     Elspeth JONELLE Her

## 2024-02-09 NOTE — Progress Notes (Signed)
 Patient arrived to Short Stay for surgery with a dog bite wound to the chin and multiple dog bite puncture wounds to left forearm.  Patient went to ED, having stitches to chin and started on Augmentin  po.  Dr. Patrisha notified, who notified Dr. Kay.

## 2024-02-09 NOTE — Discharge Instructions (Signed)
Ice to the shoulder constantly.  Keep the incision covered and clean and dry for one week, then ok to get it wet in the shower.  Do exercise as instructed several times per day.  DO NOT reach behind your back or push up out of a chair with the operative arm.  Use a sling while you are up and around for comfort, may remove while seated.  Keep pillow propped behind the operative elbow.  Follow up with Dr Veverly Fells in two weeks in the office, call [phone] for appt

## 2024-02-09 NOTE — Transfer of Care (Signed)
 Immediate Anesthesia Transfer of Care Note  Patient: Judith [name]  Procedure(s) Performed: ARTHROSCOPY, SHOULDER WITH REPAIR, ROTATOR CUFF, OPEN (Left) DECOMPRESSION, SUBACROMIAL SPACE (Left) EXCISION, CLAVICLE, DISTAL, OPEN (Left)  Patient Location: PACU  Anesthesia Type:General  Level of Consciousness: awake, alert , and oriented  Airway & Oxygen  Therapy: Patient Spontanous Breathing and Patient connected to face mask oxygen   Post-op Assessment: Report given to RN and Post -op Vital signs reviewed and stable  Post vital signs: Reviewed and stable  Last Vitals:  Vitals Value Taken Time  BP 125/77 02/09/24 14:38  Temp    Pulse 80 02/09/24 14:39  Resp 16 02/09/24 14:39  SpO2 100 % 02/09/24 14:39  Vitals shown include unfiled device data.  Last Pain:  Vitals:   02/09/24 1205  TempSrc: Oral         Complications: No notable events documented.

## 2024-02-09 NOTE — Anesthesia Procedure Notes (Signed)
 Procedure Name: Intubation Date/Time: 02/09/2024 12:57 PM  Performed by: Levander Lani BIRCH, CRNAPre-anesthesia Checklist: Patient identified, Emergency Drugs available, Suction available, Patient being monitored and Timeout performed Patient Re-evaluated:Patient Re-evaluated prior to induction Oxygen  Delivery Method: Circle system utilized Preoxygenation: Pre-oxygenation with 100% oxygen  Induction Type: IV induction Ventilation: Mask ventilation without difficulty Laryngoscope Size: Glidescope and 3 Grade View: Grade II Tube type: Oral Tube size: 7.0 mm Number of attempts: 1 Airway Equipment and Method: Stylet Placement Confirmation: ETT inserted through vocal cords under direct vision, positive ETCO2 and breath sounds checked- equal and bilateral Secured at: 21 cm Tube secured with: Tape Dental Injury: Teeth and Oropharynx as per pre-operative assessment

## 2024-02-09 NOTE — Anesthesia Preprocedure Evaluation (Addendum)
 Anesthesia Evaluation  Patient identified by MRN, date of birth, ID band Patient awake    Reviewed: Allergy & Precautions, NPO status , [name] Chart, lab work & pertinent test results  Airway Mallampati: III  TM Distance: >3 FB Neck ROM: Full    Dental  (+) Edentulous Upper, Missing   Pulmonary asthma , sleep apnea , former smoker   Pulmonary exam normal        Cardiovascular negative cardio ROS Normal cardiovascular exam     Neuro/Psych  Headaches PSYCHIATRIC DISORDERS Anxiety Depression Bipolar Disorder      GI/Hepatic negative GI ROS,,,(+)     substance abuse    Endo/Other  diabetes, Insulin  DependentHypothyroidism  Class 3 obesityPatient on GLP-1 Agonist  Renal/GU Renal disease     Musculoskeletal  (+) Arthritis ,  narcotic dependent  Abdominal  (+) + obese  Peds  Hematology negative hematology ROS (+)   Anesthesia Other Findings  Incomplete tear of left rotator cuff  Reproductive/Obstetrics                              Anesthesia Physical Anesthesia Plan  ASA: 3  Anesthesia Plan: Regional and General   Post-op Pain Management: Regional block*   Induction: Intravenous  PONV Risk Score and Plan: 3 and Ondansetron , Dexamethasone , Midazolam  and Treatment may vary due to age or medical condition  Airway Management Planned: Oral ETT  Additional Equipment:   Intra-op Plan:   Post-operative Plan: Extubation in OR  Informed Consent: I have reviewed the patients History and Physical, chart, labs and discussed the procedure including the risks, benefits and alternatives for the proposed anesthesia with the patient or authorized representative who has indicated his/her understanding and acceptance.     Dental advisory given  Plan Discussed with: CRNA  Anesthesia Plan Comments:         Anesthesia Quick Evaluation

## 2024-02-09 NOTE — Brief Op Note (Signed)
 02/09/2024  2:56 PM  PATIENT:  Judith [name]  53 y.o. female  PRE-OPERATIVE DIAGNOSIS:  Incomplete tear of left rotator cuff, unspecified whether traumatic Left shoulder AC joint OA  POST-OPERATIVE DIAGNOSIS:  Incomplete tear of left rotator cuff, unspecified whether traumatic Left shoulder AC joint OA  PROCEDURE:  Procedure(s): ARTHROSCOPY, SHOULDER WITH REPAIR, ROTATOR CUFF, OPEN (Left) DECOMPRESSION, SUBACROMIAL SPACE (Left) EXCISION, CLAVICLE, DISTAL, OPEN (Left)  SURGEON:  Surgeons and Role:    DEWAINE Kay Kemps, MD - Primary  PHYSICIAN ASSISTANT:   ASSISTANTS: Debby KATHEE Fireman, PA-C   ANESTHESIA:   regional and general  EBL:  5 mL   BLOOD ADMINISTERED:none  DRAINS: none   LOCAL MEDICATIONS USED:  MARCAINE      SPECIMEN:  No Specimen  DISPOSITION OF SPECIMEN:  N/A  COUNTS:  YES  TOURNIQUET:  * No tourniquets in log *  DICTATION: .Other Dictation: Dictation Number [id number]  PLAN OF CARE: Discharge to home after PACU  PATIENT DISPOSITION:  PACU - hemodynamically stable.   Delay start of Pharmacological VTE agent (>24hrs) due to surgical blood loss or risk of bleeding: not applicable

## 2024-02-09 NOTE — Anesthesia Procedure Notes (Signed)
 Anesthesia Regional Block: Interscalene brachial plexus block   Pre-Anesthetic Checklist: , timeout performed,  Correct Patient, Correct Site, Correct Laterality,  Correct Procedure, Correct Position, site marked,  Risks and benefits discussed,  Surgical consent,  Pre-op evaluation,  At surgeon's request and post-op pain management  Laterality: Left  Prep: chloraprep       Needles:  Injection technique: Single-shot  Needle Type: Echogenic Stimulator Needle     Needle Length: 9cm  Needle Gauge: 21     Additional Needles:   Procedures:,,,, ultrasound used (permanent image in chart),,    Narrative:  Start time: 02/09/2024 12:05 PM End time: 02/09/2024 12:15 PM Injection made incrementally with aspirations every 5 mL.  Performed by: Personally  Anesthesiologist: Patrisha Bernardino SQUIBB, MD  Additional Notes: Functioning IV was confirmed and monitors were applied.  A timeout was performed. Sterile prep, hand hygiene and sterile gloves were used. A 90mm 21ga Arrow echogenic stimulator needle was used. Negative aspiration and negative test dose prior to incremental administration of local anesthetic. The patient tolerated the procedure well.  Ultrasound guidance: relevent anatomy identified, needle position confirmed, local anesthetic spread visualized around nerve(s), vascular puncture avoided.  Image printed for medical record.

## 2024-02-10 ENCOUNTER — Encounter (HOSPITAL_COMMUNITY): Payer: Self-pay | Admitting: Orthopedic Surgery

## 2024-02-10 NOTE — Op Note (Signed)
 Judith [name], [place] MEDICAL RECORD NO: [id number] ACCOUNT NO: [id number] DATE OF BIRTH: [birth year] FACILITY: [facility] LOCATION: [facility] PHYSICIAN: Elspeth SAUNDERS. Kay, MD  Operative Report   DATE OF PROCEDURE: 02/09/2024  PREOPERATIVE DIAGNOSES:  Left shoulder rotator cuff tear, partial-thickness and also acromioclavicular arthritis.  POSTOPERATIVE DIAGNOSES:  Left shoulder rotator cuff tear, partial-thickness and also acromioclavicular arthritis.  PROCEDURE PERFORMED:  Left shoulder arthroscopy with arthroscopic subacromial decompression followed by mini-open rotator cuff repair and open distal clavicle resection.  ATTENDING SURGEON:  Elspeth SAUNDERS. Kay, MD.  ASSISTANT:  Debby Crock Dixon, [place], who was scrubbed during the entire procedure, and necessary for satisfactory completion of surgery.  ANESTHESIA:  General anesthesia was used plus interscalene block.  ESTIMATED BLOOD LOSS:  Minimal.  FLUID REPLACEMENT:  1000 mL crystalloid.  COUNTS:  Instrument count was correct.  COMPLICATIONS:  No complications.  ANTIBIOTICS:  Perioperative antibiotics were given.  INDICATIONS:  The patient is a 53 year old female who presents with a history of worsening left shoulder pain due to a partial-thickness rotator cuff tear as well as AC arthritis. The patient has failed conservative management and desires operative  treatment to eliminate pain and restore function.  Informed consent obtained.  DESCRIPTION OF PROCEDURE:  After an adequate level of anesthesia was achieved, the patient was positioned in the modified beach chair position.  The left shoulder was correctly identified and a sterile prep and drape performed.  Timeout called verifying  correct patient and correct site.  We entered the patient's shoulder using standard arthroscopic portals including anterior, posterior, and lateral portals.  We identified significant synovitis in the shoulder joint.  Fortunately, the superior labrum was   intact.  The biceps anchor was also intact. We pulled the biceps into the shoulder and it was completely normal-appearing.  The subscapularis was intact and from the underside, the joint side of the rotator cuff looked normal. The articular cartilage  was in good shape.  We placed the scope in the subacromial space. Extensive bursitis was noted. We went ahead and performed a bursectomy and an acromioplasty creating a type 1 acromial shape with a butcher block technique utilizing a high-speed bur. We  did release the CA ligament.  The rotator cuff had an obvious upper surface attritional-appearing tear.  We ended the scope.  We went ahead and did a small Saber incision overlying the AC joint.  Dissection down through subcutaneous tissues using Bovie.  We identified the deltotrapezial fascia and incised in line with the distal clavicle.  Subperiosteal dissection of the distal clavicle was performed with the Bovie. We identified the distal end of the clavicle and resected 2-3 mm of bone using an  oscillating saw to decompress that joint.  We removed hypertrophied capsule and osteophytes off the dorsal acromion at the [facility] joint margin with a rongeur. We irrigated thoroughly, applied bone wax to the clavicle.  We then went ahead and repaired  meticulously the deltotrapezial fascia with 0 Vicryl suture followed by 2-0 Vicryl for subcutaneous closure and 4-0 Monocryl for skin.  Next, we addressed the rotator cuff tear through a mini-open incision starting at the anterolateral border of the acromion extending distally about 4 cm.  Using a 10 blade scalpel, we used a Bovie to go down through the deltoid raphe between the anterior  and lateral heads. We placed Arthrex retractor, identified the torn rotator cuff, which was palpable very easily in the mid infraspinatus. We split that with a 15 blade scalpel and immediately encountered the damaged  tendon. We removed that using a  rongeur.  We prepped the greater  tuberosity with a rongeur and we placed a single Arthrex double-loaded anchor through the medial part of the rotator cuff footprint, brought that suture anchor up underneath the cortical surface of the bone. This had one  sliding suture and one fixed suture.  We used those sutures in a mattress fashion to restore the medial part of the footprint of the rotator cuff.  We also did a combination of side to side with #2 FiberWire suture as well as 0 Vicryl suture.  We had a  watertight repair, which was very stout and strong.  At this point, with the rotator cuff solidly repaired, we ranged the shoulder.  There was no impingement and the patient had a very restoration of the full thickness of the rotator cuff tendon as I  palpated it.  Once the cuff repair was done, we irrigated thoroughly and repaired the deltoid anatomically with 0 Vicryl suture side to side followed by 2-0 Vicryl for subcutaneous closure and 4-0 Monocryl for skin and portals. Steri-Strips applied  followed by a sterile dressing and a shoulder abduction pillow sling.  The patient was transported to the recovery room in stable condition.   NIK D: 02/09/2024 3:03:59 pm T: 02/10/2024 12:21:00 am  JOB: [id number]/ [id number]

## 2024-02-10 NOTE — Anesthesia Postprocedure Evaluation (Signed)
 Anesthesia Post Note  Patient: Judith [name]  Procedure(s) Performed: ARTHROSCOPY, SHOULDER WITH REPAIR, ROTATOR CUFF, OPEN (Left) DECOMPRESSION, SUBACROMIAL SPACE (Left) EXCISION, CLAVICLE, DISTAL, OPEN (Left)     Patient location during evaluation: PACU Anesthesia Type: Regional and General Level of consciousness: awake Pain management: pain level controlled Vital Signs Assessment: post-procedure vital signs reviewed and stable Respiratory status: spontaneous breathing, nonlabored ventilation and respiratory function stable Cardiovascular status: blood pressure returned to baseline and stable Postop Assessment: no apparent nausea or vomiting Anesthetic complications: no   No notable events documented.  Last Vitals:  Vitals:   02/09/24 1645 02/09/24 1653  BP:  117/80  Pulse: 85 83  Resp: 14 14  Temp:    SpO2: 93% 92%    Last Pain:  Vitals:   02/09/24 1653  TempSrc:   PainSc: 0-No pain                 Alter Moss P Seeley [place]

## 2024-02-11 ENCOUNTER — Other Ambulatory Visit: Payer: Self-pay

## 2024-02-11 DIAGNOSIS — M25512 Pain in left shoulder: Secondary | ICD-10-CM | POA: Diagnosis not present

## 2024-02-11 DIAGNOSIS — M6281 Muscle weakness (generalized): Secondary | ICD-10-CM | POA: Diagnosis not present

## 2024-02-11 DIAGNOSIS — M25612 Stiffness of left shoulder, not elsewhere classified: Secondary | ICD-10-CM | POA: Diagnosis not present

## 2024-02-11 NOTE — Progress Notes (Signed)
 Specialty Pharmacy Refill Coordination Note  Arzu BERLIN VIERECK is a 53 y.o. female contacted today regarding refills of specialty medication(s) Mepolizumab  (Nucala )   Patient requested Delivery   Delivery date: 02/16/24   Verified address: Patient address [address]  [place] [address]   Medication will be filled on 09.30.25.

## 2024-02-12 ENCOUNTER — Encounter: Payer: Self-pay | Admitting: Family Medicine

## 2024-02-14 DIAGNOSIS — M6281 Muscle weakness (generalized): Secondary | ICD-10-CM | POA: Diagnosis not present

## 2024-02-14 DIAGNOSIS — M25612 Stiffness of left shoulder, not elsewhere classified: Secondary | ICD-10-CM | POA: Diagnosis not present

## 2024-02-14 DIAGNOSIS — M25512 Pain in left shoulder: Secondary | ICD-10-CM | POA: Diagnosis not present

## 2024-02-16 DIAGNOSIS — J4540 Moderate persistent asthma, uncomplicated: Secondary | ICD-10-CM | POA: Diagnosis not present

## 2024-02-16 DIAGNOSIS — R051 Acute cough: Secondary | ICD-10-CM | POA: Diagnosis not present

## 2024-02-18 ENCOUNTER — Telehealth (INDEPENDENT_AMBULATORY_CARE_PROVIDER_SITE_OTHER): Payer: Medicare (Managed Care) | Admitting: Nurse Practitioner

## 2024-02-18 DIAGNOSIS — J45909 Unspecified asthma, uncomplicated: Secondary | ICD-10-CM

## 2024-02-18 DIAGNOSIS — J4540 Moderate persistent asthma, uncomplicated: Secondary | ICD-10-CM

## 2024-02-18 DIAGNOSIS — Z6841 Body Mass Index (BMI) 40.0 and over, adult: Secondary | ICD-10-CM | POA: Diagnosis not present

## 2024-02-18 DIAGNOSIS — G4733 Obstructive sleep apnea (adult) (pediatric): Secondary | ICD-10-CM | POA: Diagnosis not present

## 2024-02-18 NOTE — Progress Notes (Signed)
 Patient ID: Judith [name], female     DOB: 1970/12/20, 53 y.o.      MRN: [id number]  No chief complaint on file.   Virtual Visit via Video Note  I connected with Judith [name] on 02/21/24 at  2:30 PM EDT by a video enabled telemedicine application and verified that I am speaking with the correct person using two identifiers.  Location: Patient: Home Provider: Office   I discussed the limitations of evaluation and management by telemedicine and the availability of in person appointments. The patient expressed understanding and agreed to proceed.  History of Present Illness: 53 year old female, former smoker followed for asthma on biologic.  She is a patient of Dr. Leatha and last seen 12/21/2023.  Past medical history significant for migraines, hypertension, DM, GERD, hypothyroid, HLD, obesity, depression, ADD.   TEST/EVENTS:  09/26/2020 PFT: FVC 67, FEV1 75, ratio 89, TLC 67, DLCO 89.  Moderate restriction with normal diffusion capacity 10/04/2020 CXR: Mild bronchial thickening 01/03/2024 HST: AHI 10.7/h, SpO2 low 73%, baseline 97%   09/27/2023: Virtual visit with Dr. Annella.  Poorly controlled asthma now with much better controlled on Nucala .  Seen for routine follow-up.  Doing well.  No issues.  Not really using inhalers.  Had 2 exacerbations over the spring and winter.  Had to use nebulizer for little bit but otherwise doing fine.   12/21/2023: Judith [name] with Judith Tomey NP Judith [name] is a 53 year old female with sleep apnea who presents for a sleep consult. She was referred by her neurologist for a new sleep study to evaluate potential changes in her sleep apnea and its relation to her migraines. Diagnosed with sleep apnea in 2019, she was initially prescribed a CPAP machine but discontinued its use due to discomfort and cost. She attempted weight loss as an alternative management strategy, which initially seemed effective. She had mild OSA at the time, per her report.  Recently, she has been  experiencing a different type of migraines, prompting her neurologist to suggest a new sleep study. She experiences snoring, daytime fatigue, and overwhelming sleepiness, sometimes while driving, necessitating pulling over. She also falls asleep easily without realizing it, such as on the couch. Never fallen asleep while driving.  She has difficulty staying asleep, often waking to use the bathroom due to diabetes. However, she takes medication at night that aids in maintaining sleep. Her current medications include Valium as needed and three hydroxyzine  at night for anxiety, which also helps her sleep. She is on Mounjaro  for weight loss and has lost about 60 pounds. In her social history, she does not consume alcohol, coffee, tea, or energy drinks, but she drinks sodas and has recently reduced her intake.  Goes to bed around 10pm-midnight. Typically can fall asleep easily. Wakes 3 times a night. Gets up around 8-9 am.  02/18/2024: Today - follow up Patient presents today for follow up via virtual visit to review home sleep study results, which revealed mild sleep apnea. She feels better since our last visit. Nighttime sleep feels more restful and her energy levels do seem to be some better. She'd prefer to not go back on CPAP at this time. Her migraines have also improved. She denies any issues with drowsy driving. She does still snore, which seems positional based on her partner's report. She is thinking about getting a neck pillow to help with positioning as well as neck pain. She is still working on weight loss measures. No additional concerns/complaints today.  Allergies  Allergen Reactions   Diphenhydramine  Other (See Comments)    Hair feels like it is crawling   Keflex  [Cephalexin ] Hives   Morphine  Nausea And Vomiting    PCA PUMP/ DRIP -- N/V IV PUSH IN ER NO PROBLEM PER PT.     Prednisone Nausea And Vomiting   Adhesive [Tape]     Paper tape causes irritation    Erythromycin Hives   Latex  Hives   Metformin And Related Diarrhea    Diarrhea with IR and XR products   Sulfonamide Derivatives Other (See Comments)    Does not take due to family history   Immunization History  Administered Date(s) Administered   Influenza Inj Mdck Quad Pf 01/21/2021   Influenza, Seasonal, Injecte, Preservative Fre 02/09/2018   Influenza,inj,Quad PF,6+ Mos 02/14/2017, 02/20/2019, 04/23/2023   Influenza-Unspecified 02/05/2020, 01/21/2021   MMR [birth year]/1971   Moderna SARS-COV2 Booster Vaccination 06/05/2020   Moderna Sars-Covid-2 Vaccination 10/02/2019, 11/01/2019, 04/18/2020, 11/26/2020   PNEUMOCOCCAL CONJUGATE-20 11/26/2020   Pfizer Sars-cov-2 Pediatric Vaccine(77mos to <17yrs) 04/28/2023   Pneumococcal Polysaccharide-23 02/20/2019, 11/01/2019   Td 03/03/1985, 06/30/1994   Td (Adult), 2 Lf Tetanus Toxid, Preservative Free 03/03/1985, 06/30/1994   Tdap 12/24/2014, 08/15/2019, 11/26/2020, 02/07/2024   Zoster Recombinant(Shingrix ) 02/05/2020, 04/18/2020, 11/10/2022   Past Medical History:  Diagnosis Date   ADD (attention deficit disorder) without hyperactivity    Allergy    Anxiety    Arthritis    Asthma    Bipolar 1 disorder (HCC)    Blood transfusion without reported diagnosis    with child birth 16 yrs ago    Bronchitis, chronic (HCC)    Closed fracture of one rib of left side 03/15/2020   Constipation    COVID-19 virus infection 06/21/2020   DDD (degenerative disc disease), cervical    Depression    Diabetes mellitus without complication (HCC)    Dysphagia    Family history of adverse reaction to anesthesia    mother had a hard time waking up after knee surgery in 2021   Gastroparesis    GERD (gastroesophageal reflux disease)    History of kidney stones    History of MRSA infection    Hyperlipemia    Hypothyroid    Migraines    Pneumonia    Sleep apnea    no cpap     Tobacco History: Social History   Tobacco Use  Smoking Status Former   Current packs/day: 0.00    Average packs/day: 1 pack/day for 5.0 years (5.0 ttl pk-yrs)   Types: Cigarettes   Start date: 04/04/1988   Quit date: 04/04/1993   Years since quitting: 30.9  Smokeless Tobacco Never   Counseling given: Not Answered   Outpatient Medications Prior to Visit  Medication Sig Dispense Refill   Accu-Chek FastClix Lancets MISC Test blood sugars daily Dx E11.9 102 each 3   albuterol  (PROVENTIL ) (2.5 MG/3ML) 0.083% nebulizer solution Take 3 mLs (2.5 mg total) by nebulization every 6 (six) hours as needed for wheezing or shortness of breath. 150 mL 1   albuterol  (VENTOLIN  HFA) 108 (90 Base) MCG/ACT inhaler Inhale 2 puffs into the lungs every 6 (six) hours as needed for wheezing or shortness of breath (Cough). 18 g 2   amoxicillin -clavulanate (AUGMENTIN ) 875-125 MG tablet Take 1 tablet by mouth every 12 (twelve) hours. 14 tablet 0   Azelastine  HCl 137 MCG/SPRAY SOLN PLACE 1 SPRAY INTO BOTH NOSTRILS 2 (TWO) TIMES DAILY 30 mL 4   Blood Glucose Monitoring Suppl (ONETOUCH VERIO FLEX SYSTEM)  w/Device KIT Test blood sugars daily Dx E11.9 1 kit 0   celecoxib (CELEBREX) 100 MG capsule Take 100 mg by mouth 2 (two) times daily.     Continuous Glucose Sensor (FREESTYLE LIBRE 3 SENSOR) MISC Use to check blood glucose continously, change sensor every 14 days 2 each PRN   cyclobenzaprine  (FLEXERIL ) 10 MG tablet Take 1 tablet (10 mg total) by mouth 3 (three) times daily as needed for muscle spasms. 30 tablet 1   CYMBALTA 30 MG capsule Take 30 mg by mouth at bedtime.     dexlansoprazole  (DEXILANT ) 60 MG capsule Take 1 capsule (60 mg total) by mouth daily. Has failed: zantac, pepcid, prilosec, protonix , prevacid and nexium. 90 capsule 3   diazepam (VALIUM) 2 MG tablet Take 2 mg by mouth daily as needed for anxiety.     Diclofenac  Sodium 3 % GEL Apply 4 g topically daily as needed (knee pain). **note SIG change due to strength change 400 g 3   dicyclomine  (BENTYL ) 20 MG tablet Take 1 tablet (20 mg) by mouth every 6  hours as needed for spasms (abdominal cramps). 120 tablet 2   EPINEPHrine  0.3 mg/0.3 mL IJ SOAJ injection Inject 0.3 mg into the muscle as needed for anaphylaxis. 1 each 2   Erenumab -aooe (AIMOVIG ) 140 MG/ML SOAJ Inject 140 mg into the skin every 28 (twenty-eight) days. 1.12 mL 11   FLUoxetine  (PROZAC ) 40 MG capsule Take 1 capsule (40 mg total) by mouth daily. Further fills per psychiatry 30 capsule 0   gabapentin  (NEURONTIN ) 300 MG capsule TAKE 1 CAPSULE BY MOUTH THREE TIMES A DAY (Patient taking differently: Take 300-600 mg by mouth See admin instructions. Take 300 mg by mouth in the morning and take 600 at night) 270 capsule 3   glucose blood (ONETOUCH VERIO) test strip Test blood sugars daily Dx E11.9 100 each 3   hydrOXYzine  (ATARAX ) 10 MG tablet Take 1 tablet (10 mg total) by mouth 3 (three) times daily as needed for anxiety. (Patient taking differently: Take 20-30 mg by mouth at bedtime.) 270 tablet 1   insulin  degludec (TRESIBA  FLEXTOUCH) 100 UNIT/ML FlexTouch Pen INJECT 50-60 UNITS INTO THE SKIN DAILY. (Patient taking differently: Inject 60-100 Units into the skin daily as needed.) 15 mL 0   Insulin  Pen Needle (B-D UF III MINI PEN NEEDLES) 31G X 5 MM MISC use as directed once daily Dx E11.8 100 each 4   levocetirizine (XYZAL ) 5 MG tablet Take 1 tablet (5 mg total) by mouth every evening. To replace cetirizine  90 tablet 3   levothyroxine  (SYNTHROID ) 125 MCG tablet Take 1 tablet (125 mcg total) by mouth daily. 90 tablet 3   lidocaine  (LIDODERM ) 5 % Place 1 patch onto the skin daily as needed (PAIN).     linaclotide  (LINZESS ) 72 MCG capsule Take 1 capsule (72 mcg total) by mouth daily before breakfast. (Patient taking differently: Take 72 mcg by mouth daily as needed (constipation).) 30 capsule 3   lisdexamfetamine (VYVANSE ) 70 MG capsule Take 70 mg by mouth daily.     meclizine  (ANTIVERT ) 25 MG tablet Take 1 tablet (25 mg total) by mouth 3 (three) times daily as needed for dizziness. 20 tablet 5    Melatonin 10 MG TABS Take 10 mg by mouth at bedtime.     meloxicam  (MOBIC ) 7.5 MG tablet Take 7.5 mg by mouth 2 (two) times daily.     Mepolizumab  (NUCALA ) 100 MG/ML SOAJ Inject 1 mL (100 mg total) into the skin every 28 (  twenty-eight) days. 3 mL 1   montelukast  (SINGULAIR ) 10 MG tablet TAKE 1 TABLET BY MOUTH EVERYDAY AT BEDTIME 90 tablet 1   nystatin  cream (MYCOSTATIN ) Apply 1 Application topically 2 (two) times daily. x2 weeks (Patient taking differently: Apply 1 Application topically 2 (two) times daily as needed (irritation/rash).) 60 g 0   ondansetron  (ZOFRAN -ODT) 4 MG disintegrating tablet Take 1 tablet (4 mg total) by mouth every 8 (eight) hours as needed for nausea or vomiting. 12 tablet 0   PERCOCET 10-325 MG tablet Take 1 tablet by mouth every 8 (eight) hours as needed for pain. Per pain management     pioglitazone  (ACTOS ) 45 MG tablet Take 1 tablet (45 mg total) by mouth daily. 90 tablet 3   REXULTI 2 MG TABS tablet Take 2 mg by mouth daily.     Rimegepant Sulfate (NURTEC) 75 MG TBDP Dissolve 1 tablet in mouth at onset of migraine headache. MAX 1 tablet per 24 hours. 10 tablet PRN   rosuvastatin  (CRESTOR ) 40 MG tablet Take 1 tablet (40 mg total) by mouth at bedtime. 90 tablet 3   tirzepatide  (MOUNJARO ) 15 MG/0.5ML Pen Inject 15 mg into the skin once a week. 6 mL 3   No facility-administered medications prior to visit.     Review of Systems: As above  Observations/Objective: Patient is well-developed, well-nourished in no acute distress.  Resting comfortably at home.  No labored breathing.  Speech is clear and coherent with logical content.  Patient is alert and oriented at baseline.   Assessment and Plan: OSA (obstructive sleep apnea) Mild OSA. Minimal cardiovascular risks associated with mild severity OSA. Discussed potential treatment options. She has opted for continued healthy weight loss and positional sleeping. Would not be a candidate for oral appliance as she is  mostly edentulous. Advised to notify if symptoms worsen and can reconsider CPAP therapy. Safe driving practices reviewed.   Patient Instructions  Sleep study showed mild sleep apnea. Mild sleep apnea poses minimal impact on the cardiovascular system. Given this, you can continue to work on healthy weight loss measures and utilize positional sleeping (sleeping on your side or elevated head of bed). You can also try the neck pillow to see if this helps with your neck pain and snoring.   Use caution when driving and pull over if you become sleepy.   Continue Albuterol  inhaler 2 puffs or 3 mL neb every 6 hours as needed for shortness of breath or wheezing. Notify if symptoms persist despite rescue inhaler/neb use.  Continue Nucala  injections as scheduled  Continue montelukast  1 tab daily  Follow up in 6 months with Dr. Annella or Izetta Malachy PIETY for asthma. If symptoms do not improve or worsen, please contact office for sooner follow up or seek emergency care.    Asthma Stable. No recent exacerbations. Action plan in place.  Obesity, morbid, BMI 40.0-49.9 (HCC) Healthy weight loss encouraged   I discussed the assessment and treatment plan with the patient. The patient was provided an opportunity to ask questions and all were answered. The patient agreed with the plan and demonstrated an understanding of the instructions.   The patient was advised to call back or seek an in-person evaluation if the symptoms worsen or if the condition fails to improve as anticipated.  I provided 21 minutes of non-face-to-face time during this encounter.   Comer LULLA Malachy, NP

## 2024-02-21 ENCOUNTER — Encounter: Payer: Self-pay | Admitting: Nurse Practitioner

## 2024-02-21 NOTE — Patient Instructions (Addendum)
 Sleep study showed mild sleep apnea. Mild sleep apnea poses minimal impact on the cardiovascular system. Given this, you can continue to work on healthy weight loss measures and utilize positional sleeping (sleeping on your side or elevated head of bed). You can also try the neck pillow to see if this helps with your neck pain and snoring.   Use caution when driving and pull over if you become sleepy.   Continue Albuterol  inhaler 2 puffs or 3 mL neb every 6 hours as needed for shortness of breath or wheezing. Notify if symptoms persist despite rescue inhaler/neb use.  Continue Nucala  injections as scheduled  Continue montelukast  1 tab daily  Follow up in 6 months with Dr. Annella or Izetta Malachy PIETY for asthma. If symptoms do not improve or worsen, please contact office for sooner follow up or seek emergency care.

## 2024-02-21 NOTE — Assessment & Plan Note (Signed)
 Stable. No recent exacerbations. Action plan in place.

## 2024-02-21 NOTE — Assessment & Plan Note (Signed)
 Mild OSA. Minimal cardiovascular risks associated with mild severity OSA. Discussed potential treatment options. She has opted for continued healthy weight loss and positional sleeping. Would not be a candidate for oral appliance as she is mostly edentulous. Advised to notify if symptoms worsen and can reconsider CPAP therapy. Safe driving practices reviewed.   Patient Instructions  Sleep study showed mild sleep apnea. Mild sleep apnea poses minimal impact on the cardiovascular system. Given this, you can continue to work on healthy weight loss measures and utilize positional sleeping (sleeping on your side or elevated head of bed). You can also try the neck pillow to see if this helps with your neck pain and snoring.   Use caution when driving and pull over if you become sleepy.   Continue Albuterol  inhaler 2 puffs or 3 mL neb every 6 hours as needed for shortness of breath or wheezing. Notify if symptoms persist despite rescue inhaler/neb use.  Continue Nucala  injections as scheduled  Continue montelukast  1 tab daily  Follow up in 6 months with Dr. Annella or Izetta Malachy PIETY for asthma. If symptoms do not improve or worsen, please contact office for sooner follow up or seek emergency care.

## 2024-02-21 NOTE — Assessment & Plan Note (Signed)
 Healthy weight loss encouraged

## 2024-02-25 DIAGNOSIS — M25512 Pain in left shoulder: Secondary | ICD-10-CM | POA: Diagnosis not present

## 2024-02-28 DIAGNOSIS — M25612 Stiffness of left shoulder, not elsewhere classified: Secondary | ICD-10-CM | POA: Diagnosis not present

## 2024-02-28 DIAGNOSIS — M25512 Pain in left shoulder: Secondary | ICD-10-CM | POA: Diagnosis not present

## 2024-02-28 DIAGNOSIS — M6281 Muscle weakness (generalized): Secondary | ICD-10-CM | POA: Diagnosis not present

## 2024-02-29 DIAGNOSIS — F900 Attention-deficit hyperactivity disorder, predominantly inattentive type: Secondary | ICD-10-CM | POA: Diagnosis not present

## 2024-02-29 DIAGNOSIS — F339 Major depressive disorder, recurrent, unspecified: Secondary | ICD-10-CM | POA: Diagnosis not present

## 2024-02-29 DIAGNOSIS — F411 Generalized anxiety disorder: Secondary | ICD-10-CM | POA: Diagnosis not present

## 2024-02-29 DIAGNOSIS — F3112 Bipolar disorder, current episode manic without psychotic features, moderate: Secondary | ICD-10-CM | POA: Diagnosis not present

## 2024-03-09 ENCOUNTER — Other Ambulatory Visit (HOSPITAL_COMMUNITY): Payer: Self-pay

## 2024-03-13 ENCOUNTER — Other Ambulatory Visit (HOSPITAL_COMMUNITY): Payer: Self-pay

## 2024-03-14 ENCOUNTER — Other Ambulatory Visit (HOSPITAL_COMMUNITY): Payer: Self-pay

## 2024-03-14 ENCOUNTER — Other Ambulatory Visit: Payer: Self-pay

## 2024-03-14 NOTE — Progress Notes (Signed)
 Specialty Pharmacy Refill Coordination Note  Satia DEBORAH LAZCANO is a 53 y.o. female contacted today regarding refills of specialty medication(s) Mepolizumab  (Nucala )   Patient requested Delivery   Delivery date: 03/23/24   Verified address: Patient address [address]  [place] [address]   Medication will be filled on: 03/22/24

## 2024-03-15 ENCOUNTER — Other Ambulatory Visit: Payer: Self-pay | Admitting: Family Medicine

## 2024-03-15 DIAGNOSIS — K220 Achalasia of cardia: Secondary | ICD-10-CM | POA: Diagnosis not present

## 2024-03-15 DIAGNOSIS — R110 Nausea: Secondary | ICD-10-CM | POA: Diagnosis not present

## 2024-03-15 DIAGNOSIS — K219 Gastro-esophageal reflux disease without esophagitis: Secondary | ICD-10-CM | POA: Diagnosis not present

## 2024-03-15 DIAGNOSIS — J4540 Moderate persistent asthma, uncomplicated: Secondary | ICD-10-CM

## 2024-03-18 DIAGNOSIS — R051 Acute cough: Secondary | ICD-10-CM | POA: Diagnosis not present

## 2024-03-18 DIAGNOSIS — J4540 Moderate persistent asthma, uncomplicated: Secondary | ICD-10-CM | POA: Diagnosis not present

## 2024-03-21 ENCOUNTER — Other Ambulatory Visit: Payer: Self-pay

## 2024-03-22 ENCOUNTER — Other Ambulatory Visit: Payer: Self-pay

## 2024-03-22 NOTE — Progress Notes (Signed)
 Specialty Pharmacy Ongoing Clinical Assessment Note  Judith [name] is a 53 y.o. female who is being followed by the specialty pharmacy service for RxSp Asthma/COPD   [name] specialty medication(s) reviewed today: Mepolizumab  (Nucala )   Missed doses in the last 4 weeks: 0   Patient/Caregiver did not have any additional questions or concerns.   Therapeutic benefit summary: Patient is achieving benefit   Adverse events/side effects summary: No adverse events/side effects   [name] therapy is appropriate to: Continue    Goals Addressed             This Visit's Progress    Minimize recurrence of flares   On track    Patient is on track. Patient will maintain adherence.  Patient reports that she is well-controlled on therapy with no recent flares.          Follow up: 12 months  [facility]

## 2024-03-27 ENCOUNTER — Ambulatory Visit: Payer: Medicare (Managed Care)

## 2024-03-27 VITALS — BP 127/92 | HR 97 | Ht 67.0 in | Wt 270.0 lb

## 2024-03-27 DIAGNOSIS — Z0000 Encounter for general adult medical examination without abnormal findings: Secondary | ICD-10-CM | POA: Diagnosis not present

## 2024-03-27 DIAGNOSIS — M545 Low back pain, unspecified: Secondary | ICD-10-CM | POA: Diagnosis not present

## 2024-03-27 DIAGNOSIS — M25512 Pain in left shoulder: Secondary | ICD-10-CM | POA: Diagnosis not present

## 2024-03-27 DIAGNOSIS — M6281 Muscle weakness (generalized): Secondary | ICD-10-CM | POA: Diagnosis not present

## 2024-03-27 NOTE — Progress Notes (Addendum)
 Subjective:   Judith [name] is a 53 y.o. female who presents for a Medicare Annual Wellness Visit. I connected with  Faizah R Boteler on 03/27/24 by a audio enabled telemedicine application and verified that I am speaking with the correct person using two identifiers.  Patient Location: Home  Provider Location: Office/Clinic  Persons Participating in Visit: Patient.  I discussed the limitations of evaluation and management by telemedicine. The patient expressed understanding and agreed to proceed.   Vital Signs: Because this visit was a virtual/telehealth visit, some criteria may be missing or patient reported. Any vitals not documented were not able to be obtained and vitals that have been documented are patient reported.   If you're able to add these things as option in the [employer], we won't need it ... but for those who refuse to use the template, I guess it does need to be updated     Allergies (verified) Diphenhydramine , Keflex  [cephalexin ], Morphine , Prednisone, Adhesive [tape], Erythromycin, Latex, Metformin and related, and Sulfonamide derivatives   History: Past Medical History:  Diagnosis Date   ADD (attention deficit disorder) without hyperactivity    Allergy    Anxiety    Arthritis    Asthma    Bipolar 1 disorder (HCC)    Blood transfusion without reported diagnosis    with child birth 16 yrs ago    Bronchitis, chronic (HCC)    Closed fracture of one rib of left side 03/15/2020   Constipation    COPD (chronic obstructive pulmonary disease) (HCC)    COVID-19 virus infection 06/21/2020   DDD (degenerative disc disease), cervical    Depression    Diabetes mellitus without complication (HCC)    Dysphagia    Family history of adverse reaction to anesthesia    mother had a hard time waking up after knee surgery in 2021   Gastroparesis    GERD (gastroesophageal reflux disease)    History of kidney stones    History of MRSA infection     Hyperlipemia    Hypothyroid    Migraines    Neuromuscular disorder (HCC)    Pneumonia    Sleep apnea    no cpap    Past Surgical History:  Procedure Laterality Date   ABDOMINAL SURGERY     ANTERIOR CERVICAL DECOMP/DISCECTOMY FUSION N/A 10/09/2020   Procedure: Anterior Cervical Discectomy Fusion Cervical five-six;  Surgeon: Debby Dorn MATSU, MD;  Location: [facility] OR;  Service: Neurosurgery;  Laterality: N/A;   BIOPSY  02/04/2021   Procedure: BIOPSY;  Surgeon: Leigh Elspeth SQUIBB, MD;  Location: [facility];  Service: Gastroenterology;;   BOTOX  INJECTION N/A 02/04/2021   Procedure: BOTOX  INJECTION;  Surgeon: Leigh Elspeth SQUIBB, MD;  Location: [facility];  Service: Gastroenterology;  Laterality: N/A;   CARPAL TUNNEL RELEASE Bilateral 1999   CARPOMETACARPEL SUSPENSION PLASTY Left 08/05/2022   Procedure: Left thumb trapeziectomy and suspensionplasty;  Surgeon: Romona Harari, MD;  Location: [facility];  Service: Orthopedics;  Laterality: Left;  with regional block  120   CESAREAN SECTION     CHOLECYSTECTOMY     COLONOSCOPY     ESOPHAGEAL MANOMETRY N/A 07/08/2020   Procedure: ESOPHAGEAL MANOMETRY (EM);  Surgeon: Leigh Elspeth SQUIBB, MD;  Location: [facility];  Service: Gastroenterology;  Laterality: N/A;   ESOPHAGOGASTRODUODENOSCOPY (EGD) WITH PROPOFOL  N/A 02/04/2021   Procedure: ESOPHAGOGASTRODUODENOSCOPY (EGD) WITH PROPOFOL ;  Surgeon: Leigh Elspeth SQUIBB, MD;  Location: [facility];  Service: Gastroenterology;  Laterality: N/A;   JOINT REPLACEMENT  01/2008, 06/2018   R and L knee joint   REPLACEMENT TOTAL KNEE Bilateral    RESECTION DISTAL CLAVICAL Left 02/09/2024   Procedure: EXCISION, CLAVICLE, DISTAL, OPEN;  Surgeon: Kay Kemps, MD;  Location: [facility];  Service: Orthopedics;  Laterality: Left;   SCLEROTHERAPY  02/04/2021   Procedure: SCLEROTHERAPY;  Surgeon: Leigh Elspeth SQUIBB, MD;  Location: [facility];  Service: Gastroenterology;;   SHOULDER  ARTHROSCOPY WITH OPEN ROTATOR CUFF REPAIR Left 02/09/2024   Procedure: ARTHROSCOPY, SHOULDER WITH REPAIR, ROTATOR CUFF, OPEN;  Surgeon: Kay Kemps, MD;  Location: [facility];  Service: Orthopedics;  Laterality: Left;   SPINE SURGERY  01/2020   Cervical / discectomy   SUBACROMIAL DECOMPRESSION Left 02/09/2024   Procedure: DECOMPRESSION, SUBACROMIAL SPACE;  Surgeon: Kay Kemps, MD;  Location: [facility];  Service: Orthopedics;  Laterality: Left;   THYROIDECTOMY     TONSILLECTOMY     TONSILLECTOMY AND ADENOIDECTOMY     TRIGGER FINGER RELEASE Left 06/16/2021   Procedure: LEFT THUMB TRIGGER FINGER RELEASE;  Surgeon: Onesimo Oneil LABOR, MD;  Location: [facility];  Service: Orthopedics;  Laterality: Left;   TRIGGER FINGER RELEASE Right 09/29/2021   Procedure: RELEASE TRIGGER FINGER/A-1 PULLEY;  Surgeon: Onesimo Oneil LABOR, MD;  Location: [facility];  Service: Orthopedics;  Laterality: Right;  Right ring, index and thumb trigger finger   UPPER GASTROINTESTINAL ENDOSCOPY     Family History  Problem Relation Age of Onset   Stroke Mother    Asthma Mother    Bronchiolitis Mother    Atrial fibrillation Mother    GER disease Mother    Osteoporosis Mother    Osteoarthritis Mother    Dementia Mother        mild , was medically induced   Transient ischemic attack Mother        x 2   Aneurysm Mother        thoriatic   Bipolar disorder Mother    Depression Mother    Hyperlipidemia Mother    Arthritis Mother    COPD Mother    Non-Hodgkin's lymphoma Father    Hyperlipidemia Father    Arthritis Father    Seizures Brother    Breast cancer Paternal Grandmother    Lung cancer Paternal Grandmother    Mental illness Other    Diabetes Other    Esophageal cancer Other    Cancer Maternal Grandmother    Depression Son    Depression Brother    ADD / ADHD Brother    ADD / ADHD Brother    ADD / ADHD Son    Obesity Son    Diabetes Brother    Diabetes Brother    Diabetes Paternal Aunt    Diabetes Paternal Uncle     Colon cancer Neg Hx    Stomach cancer Neg Hx    Pancreatic cancer Neg Hx    Colon polyps Neg Hx    Social History   Occupational History   Not on file  Tobacco Use   Smoking status: Former    Current packs/day: 0.00    Average packs/day: 1 pack/day for 5.0 years (5.0 ttl pk-yrs)    Types: Cigarettes    Start date: 04/04/1988    Quit date: 04/04/1993    Years since quitting: 31.0   Smokeless tobacco: Never  Vaping Use   Vaping status: Never Used  Substance and Sexual Activity   Alcohol use: No   Drug use: Yes    Types: Marijuana   Sexual activity: Yes  Birth control/protection: Injection   Tobacco Counseling Counseling given: Yes  SDOH Screenings   Food Insecurity: No Food Insecurity (03/27/2024)  Housing: Low Risk  (03/27/2024)  Transportation Needs: No Transportation Needs (03/27/2024)  Utilities: Not At Risk (03/27/2024)  Alcohol Screen: Low Risk  (06/02/2023)  Depression (PHQ2-9): Medium Risk (03/27/2024)  Financial Resource Strain: Low Risk  (11/02/2023)  Recent Concern: Financial Resource Strain - Medium Risk (08/20/2023)  Physical Activity: Inactive (03/27/2024)  Social Connections: Socially Integrated (03/27/2024)  Stress: Stress Concern Present (03/27/2024)  Tobacco Use: Medium Risk (03/27/2024)  Health Literacy: Adequate Health Literacy (03/27/2024)   Depression Screen    03/27/2024   11:00 AM 11/03/2023   11:19 AM 08/20/2023   11:07 AM 06/02/2023    1:55 PM 02/22/2023   12:14 PM 12/07/2022    3:14 PM 11/10/2022    9:52 AM  PHQ 2/9 Scores  PHQ - 2 Score 4 1 1 1 1 2 2   PHQ- 9 Score 10 3  6  8  5  5  11       Data saved with a previous flowsheet row definition     Goals Addressed             This Visit's Progress    Activity and Exercise Increased   On track    Evidence-based guidance:  Review current exercise levels.  Assess patient perspective on exercise or activity level, barriers to increasing activity, motivation and readiness for change.   Recommend or set healthy exercise goal based on individual tolerance.  Encourage small steps toward making change in amount of exercise or activity.  Urge reduction of sedentary activities or screen time.  Promote group activities within the community or with family or support person.  Consider referral to rehabiliation therapist for assessment and exercise/activity plan.   Notes:      Prevent Falls and Injury   Not on track      Visit info / Clinical Intake: Medicare Wellness Visit Type:: Subsequent Annual Wellness Visit Persons participating in visit:: patient Medicare Wellness Visit Mode:: Telephone If telephone:: video error Because this visit was a virtual/telehealth visit:: vitals recorded from last visit If Telephone or Video please confirm:: I connected with the patient using audio enabled telemedicine application and verified that I am speaking with the correct person using two identifiers Patient Location:: home Provider Location:: in office Information given by:: patient Interpreter Needed?: No Pre-visit prep was completed: yes AWV questionnaire completed by patient prior to visit?: no Living arrangements:: lives with spouse/significant other [name] Overall Health Status Rating: very good Typical amount of pain: some (pt's l-shoulder is bother pt pretty bad/ pain lev=8/ pt takes oxy for pain) Does pain affect daily life?: no Are you currently prescribed opioids?: no  Dietary Habits and Nutritional Risks How many meals a day?: 2 Eats fruit and vegetables daily?: yes Most meals are obtained by: preparing own meals In the last 2 weeks, have you had any of the following?: (!) nausea, vomiting, diarrhea (nausea) Diabetic:: (!) yes Any non-healing wounds?: no How often do you check your BS?: continuous glucose monitor Would you like to be referred to a Nutritionist or for Diabetic Management? : no  Functional Status Activities of Daily Living (to include  ambulation/medication): (!) (Patient-Rptd) Needs Assist Ambulation: (Patient-Rptd) Independent Medication Administration: Independent Home Management: (Patient-Rptd) Needs assistance (comment) Manage your own finances?: yes Primary transportation is: driving Concerns about hearing?: (!) yes (need to make hearing appt to get ears check) Uses hearing aids?: ROLLEN)  yes  Fall Screening Falls in the past year?: (Patient-Rptd) 1 Number of falls in past year: (Patient-Rptd) 1 Was there an injury with Fall?: (Patient-Rptd) 1 Fall Risk Category Calculator: (Patient-Rptd) 3 Patient Fall Risk Level: Low fall risk  Fall Risk Patient at Risk for Falls Due to: Impaired balance/gait; Impaired mobility Fall risk Follow up: Education provided; Falls evaluation completed  Home and Transportation Safety: All rugs have non-skid backing?: yes All stairs or steps have railings?: yes Grab bars in the bathtub or shower?: (!) no Have non-skid surface in bathtub or shower?: (!) no Good home lighting?: yes Regular seat belt use?: yes Hospital stays in the last year:: no  Cognitive Assessment Difficulty concentrating, remembering, or making decisions? : yes (pt has ADD/on treatments) Will 6CIT or Mini Cog be Completed: yes What year is it?: 0 points What month is it?: 0 points Give patient an address phrase to remember (5 components): [address] [place], [place] About what time is it?: 0 points Count backwards from 20 to 1: 0 points Say the months of the year in reverse: 0 points Repeat the address phrase from earlier: 0 points 6 CIT Score: 0 points  Advance Directives (For Healthcare) Does Patient Have a Medical Advance Directive?: No Would patient like information on creating a medical advance directive?: No - Patient declined  Reviewed/Updated  Reviewed/Updated: Reviewed All (Medical, Surgical, Family, Medications, Allergies, Care Teams, Patient Goals)        Objective:    Today's Vitals    03/27/24 1047  BP: (!) 127/92  Pulse: 97  Weight: 270 lb (122.5 kg)  Height: 5' 7 (1.702 m)   Body mass index is 42.29 kg/m.  Current Medications (verified) Outpatient Encounter Medications as of 03/27/2024  Medication Sig   Accu-Chek FastClix Lancets MISC Test blood sugars daily Dx E11.9   albuterol  (PROVENTIL ) (2.5 MG/3ML) 0.083% nebulizer solution Take 3 mLs (2.5 mg total) by nebulization every 6 (six) hours as needed for wheezing or shortness of breath.   albuterol  (VENTOLIN  HFA) 108 (90 Base) MCG/ACT inhaler Inhale 2 puffs into the lungs every 6 (six) hours as needed for wheezing or shortness of breath (Cough).   amoxicillin -clavulanate (AUGMENTIN ) 875-125 MG tablet Take 1 tablet by mouth every 12 (twelve) hours.   Azelastine  HCl 137 MCG/SPRAY SOLN PLACE 1 SPRAY INTO BOTH NOSTRILS 2 (TWO) TIMES DAILY   Blood Glucose Monitoring Suppl (ONETOUCH VERIO FLEX SYSTEM) w/Device KIT Test blood sugars daily Dx E11.9   celecoxib (CELEBREX) 100 MG capsule Take 100 mg by mouth 2 (two) times daily.   Continuous Glucose Sensor (FREESTYLE LIBRE 3 SENSOR) MISC Use to check blood glucose continously, change sensor every 14 days   cyclobenzaprine  (FLEXERIL ) 10 MG tablet Take 1 tablet (10 mg total) by mouth 3 (three) times daily as needed for muscle spasms.   CYMBALTA 30 MG capsule Take 30 mg by mouth at bedtime.   dexlansoprazole  (DEXILANT ) 60 MG capsule Take 1 capsule (60 mg total) by mouth daily. Has failed: zantac, pepcid, prilosec, protonix , prevacid and nexium.   diazepam (VALIUM) 2 MG tablet Take 2 mg by mouth daily as needed for anxiety.   Diclofenac  Sodium 3 % GEL Apply 4 g topically daily as needed (knee pain). **note SIG change due to strength change   dicyclomine  (BENTYL ) 20 MG tablet Take 1 tablet (20 mg) by mouth every 6 hours as needed for spasms (abdominal cramps).   EPINEPHrine  0.3 mg/0.3 mL IJ SOAJ injection Inject 0.3 mg into the muscle  as needed for anaphylaxis.   Erenumab -aooe  (AIMOVIG ) 140 MG/ML SOAJ Inject 140 mg into the skin every 28 (twenty-eight) days.   FLUoxetine  (PROZAC ) 40 MG capsule Take 1 capsule (40 mg total) by mouth daily. Further fills per psychiatry   gabapentin  (NEURONTIN ) 300 MG capsule TAKE 1 CAPSULE BY MOUTH THREE TIMES A DAY (Patient taking differently: Take 300-600 mg by mouth See admin instructions. Take 300 mg by mouth in the morning and take 600 at night)   glucose blood (ONETOUCH VERIO) test strip Test blood sugars daily Dx E11.9   hydrOXYzine  (ATARAX ) 10 MG tablet Take 1 tablet (10 mg total) by mouth 3 (three) times daily as needed for anxiety. (Patient taking differently: Take 20-30 mg by mouth at bedtime.)   insulin  degludec (TRESIBA  FLEXTOUCH) 100 UNIT/ML FlexTouch Pen INJECT 50-60 UNITS INTO THE SKIN DAILY. (Patient taking differently: Inject 60-100 Units into the skin daily as needed.)   Insulin  Pen Needle (B-D UF III MINI PEN NEEDLES) 31G X 5 MM MISC use as directed once daily Dx E11.8   levocetirizine (XYZAL ) 5 MG tablet Take 1 tablet (5 mg total) by mouth every evening. To replace cetirizine    levothyroxine  (SYNTHROID ) 125 MCG tablet Take 1 tablet (125 mcg total) by mouth daily.   lidocaine  (LIDODERM ) 5 % Place 1 patch onto the skin daily as needed (PAIN).   linaclotide  (LINZESS ) 72 MCG capsule Take 1 capsule (72 mcg total) by mouth daily before breakfast. (Patient taking differently: Take 72 mcg by mouth daily as needed (constipation).)   lisdexamfetamine (VYVANSE ) 70 MG capsule Take 70 mg by mouth daily.   meclizine  (ANTIVERT ) 25 MG tablet Take 1 tablet (25 mg total) by mouth 3 (three) times daily as needed for dizziness.   Melatonin 10 MG TABS Take 10 mg by mouth at bedtime.   meloxicam  (MOBIC ) 7.5 MG tablet Take 7.5 mg by mouth 2 (two) times daily.   Mepolizumab  (NUCALA ) 100 MG/ML SOAJ Inject 1 mL (100 mg total) into the skin every 28 (twenty-eight) days.   montelukast  (SINGULAIR ) 10 MG tablet TAKE 1 TABLET BY MOUTH EVERYDAY AT  BEDTIME   nystatin  cream (MYCOSTATIN ) Apply 1 Application topically 2 (two) times daily. x2 weeks (Patient taking differently: Apply 1 Application topically 2 (two) times daily as needed (irritation/rash).)   ondansetron  (ZOFRAN -ODT) 4 MG disintegrating tablet Take 1 tablet (4 mg total) by mouth every 8 (eight) hours as needed for nausea or vomiting.   PERCOCET 10-325 MG tablet Take 1 tablet by mouth every 8 (eight) hours as needed for pain. Per pain management   pioglitazone  (ACTOS ) 45 MG tablet Take 1 tablet (45 mg total) by mouth daily.   REXULTI 2 MG TABS tablet Take 2 mg by mouth daily.   Rimegepant Sulfate (NURTEC) 75 MG TBDP Dissolve 1 tablet in mouth at onset of migraine headache. MAX 1 tablet per 24 hours.   rosuvastatin  (CRESTOR ) 40 MG tablet Take 1 tablet (40 mg total) by mouth at bedtime.   tirzepatide  (MOUNJARO ) 15 MG/0.5ML Pen Inject 15 mg into the skin once a week.   No facility-administered encounter medications on file as of 03/27/2024.   Hearing/Vision screen Hearing Screening - Comments:: Pt have hearing dif Vision Screening - Comments:: Pt wear contacts/glasses/pt goes [facility] in [place]/last ov 2025 Immunizations and Health Maintenance Health Maintenance  Topic Date Due   Hepatitis B Vaccines 19-59 Average Risk (1 of 3 - 19+ 3-dose series) Never done   FOOT EXAM  11/02/2023  Influenza Vaccine  12/17/2023   COVID-19 Vaccine (6 - 2025-26 season) 01/17/2024   Diabetic kidney evaluation - Urine ACR  06/01/2024   Medicare Annual Wellness (AWV)  06/01/2024   HEMOGLOBIN A1C  07/31/2024   OPHTHALMOLOGY EXAM  10/14/2024   Mammogram  01/11/2025   Diabetic kidney evaluation - eGFR measurement  01/31/2025   Colonoscopy  02/07/2027   Cervical Cancer Screening (HPV/Pap Cotest)  06/01/2028   DTaP/Tdap/Td (8 - Td or Tdap) 02/06/2034   Pneumococcal Vaccine: 50+ Years  Completed   Hepatitis C Screening  Completed   HIV Screening  Completed   Zoster Vaccines- Shingrix    Completed   HPV VACCINES  Aged Out   Meningococcal B Vaccine  Aged Out        Assessment/Plan:  This is a routine wellness examination for Londa.  Patient Care Team: Jolinda Norene HERO, DO as PCP - General (Family Medicine) Billee Mliss BIRCH, [facility] (Pharmacist) Karis Clunes, MD as Consulting Physician (Otolaryngology) Skeet Juliene SAUNDERS, DO as Consulting Physician (Neurology)  I have personally reviewed and noted the following in the patient's chart:   Medical and social history Use of alcohol, tobacco or illicit drugs  Current medications and supplements including opioid prescriptions. Functional ability and status Nutritional status Physical activity Advanced directives List of other physicians Hospitalizations, surgeries, and ER visits in previous 12 months Vitals Screenings to include cognitive, depression, and falls Referrals and appointments  No orders of the defined types were placed in this encounter.  In addition, I have reviewed and discussed with patient certain preventive protocols, quality metrics, and best practice recommendations. A written personalized care plan for preventive services as well as general preventive health recommendations were provided to patient.   Ozie Ned, CMA   03/27/2024   No follow-ups on file.  After Visit Summary: (MyChart) Due to this being a telephonic visit, the after visit summary with patients personalized plan was offered to patient via MyChart   Nurse Notes: pt is aware due for the following: flu, covid, and hep vaccines, foot exam due--msg pcp, per pt will f/u for diabetic report

## 2024-03-28 DIAGNOSIS — F900 Attention-deficit hyperactivity disorder, predominantly inattentive type: Secondary | ICD-10-CM | POA: Diagnosis not present

## 2024-03-28 DIAGNOSIS — F3112 Bipolar disorder, current episode manic without psychotic features, moderate: Secondary | ICD-10-CM | POA: Diagnosis not present

## 2024-03-28 DIAGNOSIS — F411 Generalized anxiety disorder: Secondary | ICD-10-CM | POA: Diagnosis not present

## 2024-03-28 DIAGNOSIS — F339 Major depressive disorder, recurrent, unspecified: Secondary | ICD-10-CM | POA: Diagnosis not present

## 2024-03-30 DIAGNOSIS — Z4889 Encounter for other specified surgical aftercare: Secondary | ICD-10-CM | POA: Diagnosis not present

## 2024-04-06 ENCOUNTER — Other Ambulatory Visit: Payer: Self-pay | Admitting: Family Medicine

## 2024-04-12 ENCOUNTER — Other Ambulatory Visit: Payer: Self-pay

## 2024-04-12 NOTE — Progress Notes (Signed)
 Specialty Pharmacy Refill Coordination Note  Judith [name] is a 53 y.o. female contacted today regarding refills of specialty medication(s) No data recorded  Patient requested Delivery   Delivery date: 04/20/24   Verified address: [address] [place] [address]   Medication will be filled on: 04/19/24

## 2024-04-14 ENCOUNTER — Other Ambulatory Visit: Payer: Self-pay

## 2024-04-19 ENCOUNTER — Other Ambulatory Visit: Payer: Self-pay | Admitting: Family Medicine

## 2024-04-19 ENCOUNTER — Other Ambulatory Visit: Payer: Self-pay

## 2024-04-19 DIAGNOSIS — E118 Type 2 diabetes mellitus with unspecified complications: Secondary | ICD-10-CM

## 2024-04-19 DIAGNOSIS — M5416 Radiculopathy, lumbar region: Secondary | ICD-10-CM | POA: Diagnosis not present

## 2024-04-19 DIAGNOSIS — M5412 Radiculopathy, cervical region: Secondary | ICD-10-CM | POA: Diagnosis not present

## 2024-04-21 DIAGNOSIS — M654 Radial styloid tenosynovitis [de Quervain]: Secondary | ICD-10-CM | POA: Diagnosis not present

## 2024-04-21 DIAGNOSIS — M65332 Trigger finger, left middle finger: Secondary | ICD-10-CM | POA: Diagnosis not present

## 2024-04-21 DIAGNOSIS — M79645 Pain in left finger(s): Secondary | ICD-10-CM | POA: Diagnosis not present

## 2024-04-30 ENCOUNTER — Ambulatory Visit
Admission: RE | Admit: 2024-04-30 | Discharge: 2024-04-30 | Disposition: A | Discharge status: Home / Self Care | Payer: Medicare (Managed Care) | Attending: Nurse Practitioner

## 2024-04-30 VITALS — BP 133/84 | HR 95 | Temp 97.8°F | Resp 15

## 2024-04-30 DIAGNOSIS — R350 Frequency of micturition: Secondary | ICD-10-CM

## 2024-04-30 DIAGNOSIS — N3001 Acute cystitis with hematuria: Secondary | ICD-10-CM | POA: Diagnosis not present

## 2024-04-30 DIAGNOSIS — R300 Dysuria: Secondary | ICD-10-CM | POA: Diagnosis not present

## 2024-04-30 LAB — POCT URINE DIPSTICK
Bilirubin, UA: NEGATIVE
Glucose, UA: NEGATIVE mg/dL
Ketones, POC UA: NEGATIVE mg/dL
Nitrite, UA: POSITIVE — AB
POC PROTEIN,UA: 100 — AB
Spec Grav, UA: >=1.03 — AB (ref 1.010–1.025)
Urobilinogen, UA: 1 U/dL
pH, UA: 5.5 (ref 5.0–8.0)

## 2024-04-30 MED ORDER — NITROFURANTOIN MONOHYD MACRO 100 MG PO CAPS: 100.0000 mg | ORAL_CAPSULE | Freq: Two times a day (BID) | ORAL | 0 refills | Status: AC

## 2024-04-30 MED ORDER — PHENAZOPYRIDINE HCL 200 MG PO TABS: 200.0000 mg | ORAL_TABLET | ORAL | 0 refills | Status: AC

## 2024-04-30 NOTE — Discharge Instructions (Addendum)
 You were seen today for symptoms consistent with a urinary tract infection (UTI). You have been prescribed Macrobid to treat the infection and Pyridium to help relieve discomfort such as burning, urgency, and bladder pressure. Take the antibiotics exactly as prescribed and complete the full course, even if you start feeling better. Pyridium  may cause your urine to change color, which is a normal side effect of the medication. A urine culture has been sent to identify the specific bacteria causing the infection and to confirm that the prescribed antibiotic is appropriate. You will only be contacted if your results are abnormal; otherwise, you may review them in your MyChart account.   It is important to stay well hydrated by drinking plenty of fluids throughout the day. This helps flush out your urinary system and keeps your urine light yellow, which is a sign of good hydration. Avoid caffeine and alcohol, as they can irritate the bladder. Be sure to urinate regularly and empty your bladder fully. Do not hold your urine for extended periods. Always wipe from front to back after using the bathroom and use a clean tissue for each wipe. Avoid douching or using sprays or powders in the genital area, as these can cause irritation. Follow up with your healthcare provider if your symptoms do not improve within a few days, get worse, or return after completing your treatment.

## 2024-04-30 NOTE — ED Triage Notes (Signed)
 Pt c/o lower back and  abdominal pain,urinary frequency, pain and burning when urinating x 1 day. No home intervention

## 2024-04-30 NOTE — ED Provider Notes (Signed)
 [name]    CSN: [id number] Arrival date & time: 04/30/24  1005      History   Chief Complaint Chief Complaint  Patient presents with   Urinary Frequency    UTI  frequency but not much when I go, burning - Entered by patient   Dysuria    HPI Judith [name] is a 53 y.o. female.   Discussed the use of AI scribe software for clinical note transcription with the patient, who gave verbal consent to proceed.   The patient presents with urinary frequency, dysuria, low back pain, and lower abdominal discomfort that began yesterday evening. She has a significant history of recurrent urinary tract infections and expresses concern due to prior complications, including a past asymptomatic UTI that required laparoscopy and a separate episode complicated by sepsis. She denies vaginal discharge, bleeding, hematuria, abnormal odor, itching, or irritation. She also denies fever, chills, nausea, or vomiting. The patient is postmenopausal.  The following sections of the patient's history were reviewed and updated as appropriate: allergies, current medications, past family history, past medical history, past social history, past surgical history, and problem list.        Past Medical History:  Diagnosis Date   ADD (attention deficit disorder) without hyperactivity    Allergy    Anxiety    Arthritis    Asthma    Bipolar 1 disorder (HCC)    Blood transfusion without reported diagnosis    with child birth 16 yrs ago    Bronchitis, chronic (HCC)    Closed fracture of one rib of left side 03/15/2020   Constipation    COPD (chronic obstructive pulmonary disease) (HCC)    COVID-19 virus infection 06/21/2020   DDD (degenerative disc disease), cervical    Depression    Diabetes mellitus without complication (HCC)    Dysphagia    Family history of adverse reaction to anesthesia    mother had a hard time waking up after knee surgery in 2021   Gastroparesis    GERD (gastroesophageal  reflux disease)    History of kidney stones    History of MRSA infection    Hyperlipemia    Hypothyroid    Migraines    Neuromuscular disorder (HCC)    Pneumonia    Sleep apnea    no cpap     Patient Active Problem List   Diagnosis Date Noted   Acquired kyphosis 02/07/2024   Hoarse 02/07/2024   Carpal tunnel syndrome of left wrist 12/25/2023   Excessive daytime sleepiness 12/22/2023   Loud snoring 12/22/2023   OSA (obstructive sleep apnea) 12/22/2023   Diabetes mellitus treated with injections of non-insulin  medication (HCC) 11/03/2023   Osteoarthritis of acromioclavicular joint 09/28/2023   Partial thickness rotator cuff tear 09/28/2023   Impingement syndrome of left shoulder region 07/09/2023   Pain in joint of left shoulder 06/20/2023   Radial styloid tenosynovitis of right hand 04/09/2023   Fear of other medical care 03/29/2023   Neck pain 03/02/2023   Gastroesophageal reflux disease with esophagitis without hemorrhage 02/22/2023   Pain of left thumb 08/25/2022   Superficial incisional surgical site infection 08/25/2022   Osteoarthritis of carpometacarpal (CMC) joint of thumb 07/29/2022   Pain of left hand 07/28/2022   Esophagogastric junction outflow obstruction    Gastric polyp    Cervical myelopathy (HCC) 10/09/2020   Dysphagia    Patellofemoral pain syndrome of both knees 04/19/2020   Tinnitus of right ear 03/15/2020   Spondylosis of cervical  spine 02/05/2020   Gastroparesis 11/03/2019   Lumbar back pain with radiculopathy affecting lower extremity 06/20/2019   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/21/2019   Bipolar 2 disorder (HCC) 05/05/2018   History of left knee replacement 02/10/2018   Bipolar 1 disorder (HCC) 02/04/2018   Moderate persistent asthma without complication 02/04/2018   Pyelonephritis 02/04/2018   Sepsis (HCC) 02/04/2018   Allergic rhinitis 01/19/2018   Dyslipidemia 01/19/2018   History of methicillin resistant staphylococcus aureus  (MRSA) 01/19/2018   IBS (irritable bowel syndrome) 01/19/2018   Severe obesity (BMI 35.0-39.9) with comorbidity (HCC) 01/19/2018   Obesity, morbid, BMI 40.0-49.9 (HCC) 11/21/2017   Morbid obesity (HCC) 11/21/2017   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   Episodic mood disorder 05/12/2016   Controlled diabetes mellitus type 2 with complications (HCC) 05/12/2016   Rash 12/04/2012   Hypothyroidism 09/13/2007   Hyperlipidemia associated with type 2 diabetes mellitus (HCC) 09/13/2007   Depression, major, single episode, moderate (HCC) 09/13/2007   Migraine headache 09/13/2007   Hypertension associated with diabetes (HCC) 09/13/2007   Asthma 09/13/2007   CHOLELITHIASIS, HX OF 09/13/2007   NEPHROLITHIASIS, HX OF 09/13/2007   CARPAL TUNNEL RELEASE, HX OF 09/13/2007    Past Surgical History:  Procedure Laterality Date   ABDOMINAL SURGERY     ANTERIOR CERVICAL DECOMP/DISCECTOMY FUSION N/A 10/09/2020   Procedure: Anterior Cervical Discectomy Fusion Cervical five-six;  Surgeon: Debby Dorn MATSU, MD;  Location: [facility];  Service: Neurosurgery;  Laterality: N/A;   BIOPSY  02/04/2021   Procedure: BIOPSY;  Surgeon: Leigh Elspeth SQUIBB, MD;  Location: [facility];  Service: Gastroenterology;;   BOTOX  INJECTION N/A 02/04/2021   Procedure: BOTOX  INJECTION;  Surgeon: Leigh Elspeth SQUIBB, MD;  Location: [facility];  Service: Gastroenterology;  Laterality: N/A;   CARPAL TUNNEL RELEASE Bilateral 1999   CARPOMETACARPEL SUSPENSION PLASTY Left 08/05/2022   Procedure: Left thumb trapeziectomy and suspensionplasty;  Surgeon: Romona Harari, MD;  Location: [facility];  Service: Orthopedics;  Laterality: Left;  with regional block  120   CESAREAN SECTION     CHOLECYSTECTOMY     COLONOSCOPY     ESOPHAGEAL MANOMETRY N/A 07/08/2020   Procedure: ESOPHAGEAL MANOMETRY (EM);  Surgeon: Leigh Elspeth SQUIBB, MD;  Location: [facility];  Service: Gastroenterology;   Laterality: N/A;   ESOPHAGOGASTRODUODENOSCOPY (EGD) WITH PROPOFOL  N/A 02/04/2021   Procedure: ESOPHAGOGASTRODUODENOSCOPY (EGD) WITH PROPOFOL ;  Surgeon: Leigh Elspeth SQUIBB, MD;  Location: [facility];  Service: Gastroenterology;  Laterality: N/A;   JOINT REPLACEMENT  01/2008, 06/2018   R and L knee joint   REPLACEMENT TOTAL KNEE Bilateral    RESECTION DISTAL CLAVICAL Left 02/09/2024   Procedure: EXCISION, CLAVICLE, DISTAL, OPEN;  Surgeon: Kay Kemps, MD;  Location: [facility];  Service: Orthopedics;  Laterality: Left;   SCLEROTHERAPY  02/04/2021   Procedure: SCLEROTHERAPY;  Surgeon: Leigh Elspeth SQUIBB, MD;  Location: [facility];  Service: Gastroenterology;;   SHOULDER ARTHROSCOPY WITH OPEN ROTATOR CUFF REPAIR Left 02/09/2024   Procedure: ARTHROSCOPY, SHOULDER WITH REPAIR, ROTATOR CUFF, OPEN;  Surgeon: Kay Kemps, MD;  Location: [facility];  Service: Orthopedics;  Laterality: Left;   SPINE SURGERY  01/2020   Cervical / discectomy   SUBACROMIAL DECOMPRESSION Left 02/09/2024   Procedure: DECOMPRESSION, SUBACROMIAL SPACE;  Surgeon: Kay Kemps, MD;  Location: [facility];  Service: Orthopedics;  Laterality: Left;   THYROIDECTOMY     TONSILLECTOMY     TONSILLECTOMY AND ADENOIDECTOMY     TRIGGER FINGER RELEASE Left 06/16/2021   Procedure: LEFT  THUMB TRIGGER FINGER RELEASE;  Surgeon: Onesimo Oneil LABOR, MD;  Location: [facility];  Service: Orthopedics;  Laterality: Left;   TRIGGER FINGER RELEASE Right 09/29/2021   Procedure: RELEASE TRIGGER FINGER/A-1 PULLEY;  Surgeon: Onesimo Oneil LABOR, MD;  Location: [facility];  Service: Orthopedics;  Laterality: Right;  Right ring, index and thumb trigger finger   UPPER GASTROINTESTINAL ENDOSCOPY      OB History     Gravida  1   Para  1   Term  1   Preterm      AB      Living         SAB      IAB      Ectopic      Multiple      Live Births               Home Medications    Prior to Admission medications  Medication Sig Start Date End Date  Taking? Authorizing Provider  Azelastine  HCl 137 MCG/SPRAY SOLN PLACE 1 SPRAY INTO BOTH NOSTRILS 2 (TWO) TIMES DAILY 11/22/23  Yes Gottschalk, Norene M, DO  Blood Glucose Monitoring Suppl (ONETOUCH VERIO FLEX SYSTEM) w/Device KIT TEST BLOOD SUGARS DAILY DX E11.9 04/06/24  Yes Jolinda Norene M, DO  celecoxib (CELEBREX) 100 MG capsule Take 100 mg by mouth 2 (two) times daily. 01/22/24  Yes [provider]  cyclobenzaprine  (FLEXERIL ) 10 MG tablet Take 1 tablet (10 mg total) by mouth 3 (three) times daily as needed for muscle spasms. 06/04/23  Yes Gottschalk, Ashly M, DO  CYMBALTA 30 MG capsule Take 30 mg by mouth at bedtime. 01/18/24  Yes [provider]  dexlansoprazole  (DEXILANT ) 60 MG capsule Take 1 capsule (60 mg total) by mouth daily. Has failed: zantac, pepcid, prilosec, protonix , prevacid and nexium. 08/20/23  Yes Gottschalk, Norene M, DO  diazepam (VALIUM) 2 MG tablet Take 2 mg by mouth daily as needed for anxiety. 11/02/23  Yes [provider]  Diclofenac  Sodium 3 % GEL Apply 4 g topically daily as needed (knee pain). **note SIG change due to strength change 01/24/24  Yes Gottschalk, Ashly M, DO  dicyclomine  (BENTYL ) 20 MG tablet Take 1 tablet (20 mg) by mouth every 6 hours as needed for spasms (abdominal cramps). 06/03/23  Yes Jolinda Norene M, DO  FLUoxetine  (PROZAC ) 40 MG capsule Take 1 capsule (40 mg total) by mouth daily. Further fills per psychiatry 10/15/23  Yes Gottschalk, Ashly M, DO  gabapentin  (NEURONTIN ) 300 MG capsule TAKE 1 CAPSULE BY MOUTH THREE TIMES A DAY Patient taking differently: Take 300-600 mg by mouth See admin instructions. Take 300 mg by mouth in the morning and take 600 at night 11/22/23  Yes Gottschalk, Ashly M, DO  hydrOXYzine  (ATARAX ) 10 MG tablet Take 1 tablet (10 mg total) by mouth 3 (three) times daily as needed for anxiety. Patient taking differently: Take 20-30 mg by mouth at bedtime. 06/30/23  Yes Jolinda Norene M, DO  levocetirizine (XYZAL ) 5  MG tablet Take 1 tablet (5 mg total) by mouth every evening. To replace cetirizine  01/24/24  Yes Jolinda Norene M, DO  levothyroxine  (SYNTHROID ) 125 MCG tablet Take 1 tablet (125 mcg total) by mouth daily. 08/20/23  Yes Jolinda Norene M, DO  linaclotide  (LINZESS ) 72 MCG capsule Take 1 capsule (72 mcg total) by mouth daily before breakfast. Patient taking differently: Take 72 mcg by mouth daily as needed (constipation). 02/10/22  Yes Gottschalk, Norene M, DO  lisdexamfetamine  (VYVANSE ) 70 MG capsule Take  70 mg by mouth daily.   Yes [provider]  montelukast  (SINGULAIR ) 10 MG tablet TAKE 1 TABLET BY MOUTH EVERYDAY AT BEDTIME 03/15/24  Yes Gottschalk, Ashly M, DO  nitrofurantoin , macrocrystal-monohydrate, (MACROBID ) 100 MG capsule Take 1 capsule (100 mg total) by mouth 2 (two) times daily. 04/30/24  Yes Iola Lukes, FNP  ondansetron  (ZOFRAN -ODT) 4 MG disintegrating tablet Take 1 tablet (4 mg total) by mouth every 8 (eight) hours as needed for nausea or vomiting. 05/06/23  Yes Hermanns, Ashlee P, PA-C  PERCOCET 10-325 MG tablet Take 1 tablet by mouth every 8 (eight) hours as needed for pain. Per pain management 06/26/23  Yes [provider]  phenazopyridine  (PYRIDIUM ) 200 MG tablet Take 1 tablet (200 mg total) by mouth 3 (three) times daily at 8am, 3pm and bedtime for 2 days. 04/30/24 05/02/24 Yes Iola Lukes, FNP  pioglitazone  (ACTOS ) 45 MG tablet Take 1 tablet (45 mg total) by mouth daily. 08/20/23  Yes Gottschalk, Ashly M, DO  REXULTI 2 MG TABS tablet Take 2 mg by mouth daily.   Yes [provider]  Rimegepant Sulfate (NURTEC) 75 MG TBDP Dissolve 1 tablet in mouth at onset of migraine headache. MAX 1 tablet per 24 hours. 08/20/23  Yes Jolinda Potter M, DO  rosuvastatin  (CRESTOR ) 40 MG tablet Take 1 tablet (40 mg total) by mouth at bedtime. 08/20/23  Yes Gottschalk, Potter M, DO  tirzepatide  (MOUNJARO ) 15 MG/0.5ML Pen Inject 15 mg into the skin once a week. 07/09/23   Yes Jolinda Potter M, DO  Accu-Chek FastClix Lancets MISC Test blood sugars daily Dx E11.9 11/27/21   Jolinda Potter M, DO  albuterol  (PROVENTIL ) (2.5 MG/3ML) 0.083% nebulizer solution Take 3 mLs (2.5 mg total) by nebulization every 6 (six) hours as needed for wheezing or shortness of breath. 06/07/23   Jolinda Potter HERO, DO  albuterol  (VENTOLIN  HFA) 108 (90 Base) MCG/ACT inhaler Inhale 2 puffs into the lungs every 6 (six) hours as needed for wheezing or shortness of breath (Cough). 06/06/23   Joesph Shaver Scales, PA-C  amoxicillin -clavulanate (AUGMENTIN ) 875-125 MG tablet Take 1 tablet by mouth every 12 (twelve) hours. Patient not taking: Reported on 04/30/2024 02/07/24   Silver Wonda LABOR, PA  Continuous Glucose Sensor (FREESTYLE LIBRE 3 SENSOR) MISC Use to check blood glucose continously, change sensor every 14 days 06/22/23   Jolinda Potter M, DO  EPINEPHrine  0.3 mg/0.3 mL IJ SOAJ injection Inject 0.3 mg into the muscle as needed for anaphylaxis. 11/25/22   Hunsucker, Donnice SAUNDERS, MD  Erenumab -aooe (AIMOVIG ) 140 MG/ML SOAJ Inject 140 mg into the skin every 28 (twenty-eight) days. 11/29/23   Skeet Juliene SAUNDERS, DO  glucose blood (ONETOUCH VERIO) test strip Test blood sugars daily Dx E11.9 01/06/24   Jolinda Potter M, DO  insulin  degludec (TRESIBA  FLEXTOUCH) 200 UNIT/ML FlexTouch Pen Inject 50-60 Units into the skin at bedtime as needed. **NEEDS TO BE SEEN BEFORE NEXT REFILL** 04/19/24   Jolinda Potter M, DO  Insulin  Pen Needle (B-D UF III MINI PEN NEEDLES) 31G X 5 MM MISC use as directed once daily Dx E11.8 04/12/23   Jolinda Potter M, DO  lidocaine  (LIDODERM ) 5 % Place 1 patch onto the skin daily as needed (PAIN).    [provider]  meclizine  (ANTIVERT ) 25 MG tablet Take 1 tablet (25 mg total) by mouth 3 (three) times daily as needed for dizziness. 11/29/23   Skeet Juliene SAUNDERS, DO  Melatonin 10 MG TABS Take 10 mg by mouth at bedtime.  [provider]  meloxicam  (MOBIC ) 7.5 MG  tablet Take 7.5 mg by mouth 2 (two) times daily. Patient not taking: Reported on 04/30/2024    [provider]  Mepolizumab  (NUCALA ) 100 MG/ML SOAJ Inject 1 mL (100 mg total) into the skin every 28 (twenty-eight) days. 01/05/24   Hunsucker, Donnice SAUNDERS, MD  nystatin  cream (MYCOSTATIN ) Apply 1 Application topically 2 (two) times daily. x2 weeks Patient taking differently: Apply 1 Application topically 2 (two) times daily as needed (irritation/rash). 05/06/23   Hermanns, Ulanda SQUIBB, PA-C    Family History Family History  Problem Relation Age of Onset   Stroke Mother    Asthma Mother    Bronchiolitis Mother    Atrial fibrillation Mother    GER disease Mother    Osteoporosis Mother    Osteoarthritis Mother    Dementia Mother        mild , was medically induced   Transient ischemic attack Mother        x 2   Aneurysm Mother        thoriatic   Bipolar disorder Mother    Depression Mother    Hyperlipidemia Mother    Arthritis Mother    COPD Mother    Non-Hodgkin's lymphoma Father    Hyperlipidemia Father    Arthritis Father    Seizures Brother    Breast cancer Paternal Grandmother    Lung cancer Paternal Grandmother    Mental illness Other    Diabetes Other    Esophageal cancer Other    Cancer Maternal Grandmother    Depression Son    Depression Brother    ADD / ADHD Brother    ADD / ADHD Brother    ADD / ADHD Son    Obesity Son    Diabetes Brother    Diabetes Brother    Diabetes Paternal Aunt    Diabetes Paternal Uncle    Colon cancer Neg Hx    Stomach cancer Neg Hx    Pancreatic cancer Neg Hx    Colon polyps Neg Hx     Social History Social History[1]   Allergies   Diphenhydramine , Keflex  [cephalexin ], Morphine , Prednisone, Adhesive [tape], Erythromycin, Latex, Metformin and related, and Sulfonamide derivatives   Review of Systems Review of Systems  Constitutional:  Negative for fever.  Gastrointestinal:  Positive for abdominal pain (lower). Negative  for nausea and vomiting.  Genitourinary:  Positive for dysuria, frequency and urgency. Negative for hematuria, menstrual problem (postmenopausal) and vaginal discharge.       No vaginal odor, itching or irritation   Musculoskeletal:  Positive for back pain (lower).  All other systems reviewed and are negative.    Physical Exam Triage Vital Signs ED Triage Vitals  Encounter Vitals Group     BP 04/30/24 1018 133/84     Girls Systolic BP Percentile --      Girls Diastolic BP Percentile --      Boys Systolic BP Percentile --      Boys Diastolic BP Percentile --      Pulse Rate 04/30/24 1018 95     Resp 04/30/24 1018 15     Temp 04/30/24 1018 97.8 F (36.6 C)     Temp Source 04/30/24 1018 Oral     SpO2 04/30/24 1018 95 %     Weight --      Height --      Head Circumference --      Peak Flow --  Pain Score 04/30/24 1020 8     Pain Loc --      Pain Education --      Exclude from Growth Chart --    No data found.  Updated Vital Signs BP 133/84 (BP Location: Right Arm)   Pulse 95   Temp 97.8 F (36.6 C) (Oral)   Resp 15   SpO2 95%   Visual Acuity Right Eye Distance:   Left Eye Distance:   Bilateral Distance:    Right Eye Near:   Left Eye Near:    Bilateral Near:     Physical Exam Vitals and nursing note reviewed.  Constitutional:      General: She is awake. She is not in acute distress.    Appearance: Normal appearance. She is well-developed and well-groomed. She is obese. She is not ill-appearing, toxic-appearing or diaphoretic.  HENT:     Head: Normocephalic.     Right Ear: Hearing normal.     Left Ear: Hearing normal.     Nose: Nose normal.     Mouth/Throat:     Mouth: Mucous membranes are moist.  Eyes:     General: Vision grossly intact.     Conjunctiva/sclera: Conjunctivae normal.  Cardiovascular:     Rate and Rhythm: Normal rate.  Pulmonary:     Effort: Pulmonary effort is normal.  Abdominal:     Palpations: Abdomen is soft.     Tenderness:  There is no abdominal tenderness. There is no right CVA tenderness or left CVA tenderness.  Musculoskeletal:        General: Normal range of motion.     Cervical back: Normal range of motion and neck supple.  Skin:    General: Skin is warm and dry.  Neurological:     General: No focal deficit present.     Mental Status: She is alert and oriented to person, place, and time.  Psychiatric:        Mood and Affect: Mood normal.        Behavior: Behavior normal. Behavior is cooperative.      UC Treatments / Results  Labs (all labs ordered are listed, but only abnormal results are displayed) Labs Reviewed  POCT URINE DIPSTICK - Abnormal; Notable for the following components:      Result Value   Color, UA brown (*)    Clarity, UA cloudy (*)    Spec Grav, UA >=1.030 (*)    Blood, UA large (*)    POC PROTEIN,UA =100 (*)    Nitrite, UA Positive (*)    Leukocytes, UA Small (1+) (*)    All other components within normal limits  URINE CULTURE    EKG   Radiology No results found.  Procedures Procedures (including critical care time)  Medications Ordered in UC Medications - No data to display  Initial Impression / Assessment and Plan / UC Course  I have reviewed the triage vital signs and the nursing notes.  Pertinent labs & imaging results that were available during my care of the patient were reviewed by me and considered in my medical decision making (see chart for details).     Patient presents with symptoms consistent with a urinary tract infection. Urinalysis reveals brown, cloudy urine, large RBCs, positive nitrites, and leukocyte esterase, supporting the diagnosis. Nitrofurantoin  was prescribed to be taken twice daily for 5 days. Pyridium  was prescribed for urinary discomfort, to be taken three times daily for 2 days, with counseling that it may turn the  urine orange. A urine culture was sent to identify the causative organism and assess antibiotic sensitivity. Patient was  advised that they will be contacted only if the culture results require a change in treatment; otherwise, results can be reviewed via MyChart. Patient instructed to increase fluid intake and monitor symptoms. Follow up with primary care provider if symptoms do not improve or worsen. Emergency evaluation is warranted for fever, back or flank pain, nausea, vomiting, or signs of systemic illness.  Today's evaluation has revealed no signs of a dangerous process. Discussed diagnosis with patient and/or guardian. Patient and/or guardian aware of their diagnosis, possible red flag symptoms to watch out for and need for close follow up. Patient and/or guardian understands verbal and written discharge instructions. Patient and/or guardian comfortable with plan and disposition.  Patient and/or guardian has a clear mental status at this time, good insight into illness (after discussion and teaching) and has clear judgment to make decisions regarding their care  Documentation was completed with the aid of voice recognition software. Transcription may contain typographical errors.   Final Clinical Impressions(s) / UC Diagnoses   Final diagnoses:  Dysuria  Acute cystitis with hematuria  Urinary frequency     Discharge Instructions      You were seen today for symptoms consistent with a urinary tract infection (UTI). You have been prescribed Macrobid  to treat the infection and Pyridium  to help relieve discomfort such as burning, urgency, and bladder pressure. Take the antibiotics exactly as prescribed and complete the full course, even if you start feeling better. Pyridium   may cause your urine to change color, which is a normal side effect of the medication. A urine culture has been sent to identify the specific bacteria causing the infection and to confirm that the prescribed antibiotic is appropriate. You will only be contacted if your results are abnormal; otherwise, you may review them in your MyChart  account.   It is important to stay well hydrated by drinking plenty of fluids throughout the day. This helps flush out your urinary system and keeps your urine light yellow, which is a sign of good hydration. Avoid caffeine and alcohol, as they can irritate the bladder. Be sure to urinate regularly and empty your bladder fully. Do not hold your urine for extended periods. Always wipe from front to back after using the bathroom and use a clean tissue for each wipe. Avoid douching or using sprays or powders in the genital area, as these can cause irritation. Follow up with your healthcare provider if your symptoms do not improve within a few days, get worse, or return after completing your treatment.     ED Prescriptions     Medication Sig Dispense Auth. Provider   nitrofurantoin , macrocrystal-monohydrate, (MACROBID ) 100 MG capsule Take 1 capsule (100 mg total) by mouth 2 (two) times daily. 10 capsule Iola Lukes, FNP   phenazopyridine  (PYRIDIUM ) 200 MG tablet Take 1 tablet (200 mg total) by mouth 3 (three) times daily at 8am, 3pm and bedtime for 2 days. 6 tablet Iola Lukes, FNP      PDMP not reviewed this encounter.     [1]  Social History Tobacco Use   Smoking status: Former    Current packs/day: 0.00    Average packs/day: 1 pack/day for 5.0 years (5.0 ttl pk-yrs)    Types: Cigarettes    Start date: 04/04/1988    Quit date: 04/04/1993    Years since quitting: 31.0   Smokeless tobacco: Never  Vaping Use  Vaping status: Never Used  Substance Use Topics   Alcohol use: No   Drug use: Yes    Types: Marijuana     Iola Lukes, FNP 04/30/24 1057

## 2024-05-01 ENCOUNTER — Ambulatory Visit: Payer: Self-pay | Admitting: Nurse Practitioner

## 2024-05-02 DIAGNOSIS — M25612 Stiffness of left shoulder, not elsewhere classified: Secondary | ICD-10-CM | POA: Diagnosis not present

## 2024-05-02 DIAGNOSIS — M6281 Muscle weakness (generalized): Secondary | ICD-10-CM | POA: Diagnosis not present

## 2024-05-02 DIAGNOSIS — M25512 Pain in left shoulder: Secondary | ICD-10-CM | POA: Diagnosis not present

## 2024-05-02 DIAGNOSIS — M545 Low back pain, unspecified: Secondary | ICD-10-CM | POA: Diagnosis not present

## 2024-05-02 LAB — URINE CULTURE: Culture: >=100000 — AB

## 2024-05-03 DIAGNOSIS — M5416 Radiculopathy, lumbar region: Secondary | ICD-10-CM | POA: Diagnosis not present

## 2024-05-09 ENCOUNTER — Other Ambulatory Visit (HOSPITAL_COMMUNITY): Payer: Self-pay

## 2024-05-15 ENCOUNTER — Other Ambulatory Visit: Payer: Self-pay

## 2024-05-16 ENCOUNTER — Other Ambulatory Visit: Payer: Self-pay | Admitting: Neurology

## 2024-05-16 ENCOUNTER — Other Ambulatory Visit: Payer: Self-pay | Admitting: Family Medicine

## 2024-05-16 DIAGNOSIS — J4531 Mild persistent asthma with (acute) exacerbation: Secondary | ICD-10-CM

## 2024-05-17 ENCOUNTER — Other Ambulatory Visit (HOSPITAL_COMMUNITY): Payer: Self-pay

## 2024-05-19 ENCOUNTER — Other Ambulatory Visit: Payer: Self-pay

## 2024-05-19 ENCOUNTER — Other Ambulatory Visit (HOSPITAL_COMMUNITY): Payer: Self-pay

## 2024-05-19 NOTE — Progress Notes (Signed)
 Called pt and discussed her options for her Nucala  copay. She stated she is on a couple other expensive medications that helped her get to catastrophic coverage fairly quickly last year, so her copay was $0 last year. I advised her if she is able to attempt to fill these medications when she can, but the copays may also be expensive.  We discussed looking into M3P, but she was concerned if she enrolled it would prevent her from going into catastrophic as quickly. I advised her I was not sure how this worked with M3P and that would be a question for her insurance.  I also brought up LIS, pt stated she believes she used this last year. I directed her to try to apply for this again online. Pt started application online while we were on the phone, I advised her to let me know once she has completed the application. She also inquired if we had any samples on hand, advised her I would have to confirm and I can look into providing her with one once she's submitted the application. Will await response from pt in regard to her application.

## 2024-05-19 NOTE — Progress Notes (Signed)
 Patient called back for Nucala  refill, copay on plan is [phone], likely deductible reset but per patient she has never paid more than $4.00 for medication and there have been no changes to her current prescription plan. Routed to clinic [facility] for BIV and additional assistance.

## 2024-05-22 ENCOUNTER — Encounter (HOSPITAL_BASED_OUTPATIENT_CLINIC_OR_DEPARTMENT_OTHER): Payer: Self-pay | Admitting: Orthopedic Surgery

## 2024-05-22 ENCOUNTER — Encounter (HOSPITAL_BASED_OUTPATIENT_CLINIC_OR_DEPARTMENT_OTHER)
Admission: RE | Admit: 2024-05-22 | Discharge: 2024-05-22 | Disposition: A | Discharge status: Home / Self Care | Payer: Medicare (Managed Care) | Source: Ambulatory Visit | Attending: Orthopedic Surgery | Admitting: Orthopedic Surgery

## 2024-05-22 DIAGNOSIS — Z01818 Encounter for other preprocedural examination: Secondary | ICD-10-CM | POA: Insufficient documentation

## 2024-05-22 LAB — BASIC METABOLIC PANEL WITH GFR
Anion gap: 10 (ref 5–15)
BUN: 13 mg/dL (ref 6–20)
CO2: 27 mmol/L (ref 22–32)
Calcium: 9 mg/dL (ref 8.9–10.3)
Chloride: 103 mmol/L (ref 98–111)
Creatinine, Ser: 0.79 mg/dL (ref 0.44–1.00)
GFR, Estimated: >60 mL/min
Glucose, Bld: 206 mg/dL — ABNORMAL HIGH (ref 70–99)
Potassium: 3.9 mmol/L (ref 3.5–5.1)
Sodium: 139 mmol/L (ref 135–145)

## 2024-05-24 ENCOUNTER — Ambulatory Visit (HOSPITAL_BASED_OUTPATIENT_CLINIC_OR_DEPARTMENT_OTHER): Payer: Medicare (Managed Care) | Admitting: Anesthesiology

## 2024-05-24 ENCOUNTER — Encounter (HOSPITAL_BASED_OUTPATIENT_CLINIC_OR_DEPARTMENT_OTHER): Admission: RE | Disposition: A | Payer: Self-pay | Source: Home / Self Care | Attending: Orthopedic Surgery

## 2024-05-24 ENCOUNTER — Encounter (HOSPITAL_BASED_OUTPATIENT_CLINIC_OR_DEPARTMENT_OTHER): Payer: Self-pay | Admitting: Orthopedic Surgery

## 2024-05-24 ENCOUNTER — Ambulatory Visit (HOSPITAL_BASED_OUTPATIENT_CLINIC_OR_DEPARTMENT_OTHER)
Admission: RE | Admit: 2024-05-24 | Discharge: 2024-05-24 | Disposition: A | Discharge status: Home / Self Care | Payer: Medicare (Managed Care) | Attending: Orthopedic Surgery | Admitting: Orthopedic Surgery

## 2024-05-24 DIAGNOSIS — F319 Bipolar disorder, unspecified: Secondary | ICD-10-CM | POA: Diagnosis not present

## 2024-05-24 DIAGNOSIS — E66813 Obesity, class 3: Secondary | ICD-10-CM | POA: Diagnosis not present

## 2024-05-24 DIAGNOSIS — Z794 Long term (current) use of insulin: Secondary | ICD-10-CM | POA: Diagnosis not present

## 2024-05-24 DIAGNOSIS — Z7985 Long-term (current) use of injectable non-insulin antidiabetic drugs: Secondary | ICD-10-CM | POA: Diagnosis not present

## 2024-05-24 DIAGNOSIS — J449 Chronic obstructive pulmonary disease, unspecified: Secondary | ICD-10-CM | POA: Diagnosis not present

## 2024-05-24 DIAGNOSIS — K219 Gastro-esophageal reflux disease without esophagitis: Secondary | ICD-10-CM | POA: Insufficient documentation

## 2024-05-24 DIAGNOSIS — F418 Other specified anxiety disorders: Secondary | ICD-10-CM | POA: Diagnosis not present

## 2024-05-24 DIAGNOSIS — M654 Radial styloid tenosynovitis [de Quervain]: Secondary | ICD-10-CM | POA: Insufficient documentation

## 2024-05-24 DIAGNOSIS — Z79891 Long term (current) use of opiate analgesic: Secondary | ICD-10-CM | POA: Insufficient documentation

## 2024-05-24 DIAGNOSIS — M65332 Trigger finger, left middle finger: Secondary | ICD-10-CM | POA: Insufficient documentation

## 2024-05-24 DIAGNOSIS — G4730 Sleep apnea, unspecified: Secondary | ICD-10-CM | POA: Diagnosis not present

## 2024-05-24 DIAGNOSIS — Z87891 Personal history of nicotine dependence: Secondary | ICD-10-CM | POA: Insufficient documentation

## 2024-05-24 DIAGNOSIS — J4489 Other specified chronic obstructive pulmonary disease: Secondary | ICD-10-CM | POA: Insufficient documentation

## 2024-05-24 DIAGNOSIS — E1143 Type 2 diabetes mellitus with diabetic autonomic (poly)neuropathy: Secondary | ICD-10-CM | POA: Insufficient documentation

## 2024-05-24 DIAGNOSIS — E039 Hypothyroidism, unspecified: Secondary | ICD-10-CM | POA: Diagnosis not present

## 2024-05-24 DIAGNOSIS — Z6841 Body Mass Index (BMI) 40.0 and over, adult: Secondary | ICD-10-CM | POA: Diagnosis not present

## 2024-05-24 HISTORY — PX: DORSAL COMPARTMENT RELEASE: SHX5039

## 2024-05-24 HISTORY — PX: TRIGGER FINGER RELEASE: SHX641

## 2024-05-24 LAB — GLUCOSE, CAPILLARY
Glucose-Capillary: 150 mg/dL — ABNORMAL HIGH (ref 70–99)
Glucose-Capillary: 126 mg/dL — ABNORMAL HIGH (ref 70–99)

## 2024-05-24 MED ORDER — AMISULPRIDE (ANTIEMETIC) 5 MG/2ML IV SOLN: 10.0000 mg | Freq: Once | INTRAVENOUS | Status: DC | PRN

## 2024-05-24 MED ORDER — FENTANYL CITRATE (PF) 100 MCG/2ML IJ SOLN
INTRAMUSCULAR | Status: AC
  Filled 2024-05-24: qty 2

## 2024-05-24 MED ORDER — PROPOFOL 10 MG/ML IV BOLUS
INTRAVENOUS | Status: DC | PRN
  Administered 2024-05-24: 20 mg via INTRAVENOUS

## 2024-05-24 MED ORDER — ONDANSETRON HCL 4 MG/2ML IJ SOLN
INTRAMUSCULAR | Status: DC | PRN
  Administered 2024-05-24: 4 mg via INTRAVENOUS

## 2024-05-24 MED ORDER — FENTANYL CITRATE (PF) 100 MCG/2ML IJ SOLN
25.0000 ug | INTRAMUSCULAR | Status: DC | PRN
  Administered 2024-05-24: 50 ug via INTRAVENOUS

## 2024-05-24 MED ORDER — ONDANSETRON HCL 4 MG/2ML IJ SOLN
INTRAMUSCULAR | Status: AC
  Filled 2024-05-24: qty 2

## 2024-05-24 MED ORDER — DEXMEDETOMIDINE HCL IN NACL 80 MCG/20ML IV SOLN
INTRAVENOUS | Status: AC
  Filled 2024-05-24: qty 20

## 2024-05-24 MED ORDER — PROPOFOL 10 MG/ML IV BOLUS
INTRAVENOUS | Status: AC
  Filled 2024-05-24: qty 20

## 2024-05-24 MED ORDER — FENTANYL CITRATE (PF) 100 MCG/2ML IJ SOLN
INTRAMUSCULAR | Status: DC | PRN
  Administered 2024-05-24: 25 ug via INTRAVENOUS

## 2024-05-24 MED ORDER — LIDOCAINE 2% (20 MG/ML) 5 ML SYRINGE
INTRAMUSCULAR | Status: AC
  Filled 2024-05-24: qty 5

## 2024-05-24 MED ORDER — PROPOFOL 500 MG/50ML IV EMUL
INTRAVENOUS | Status: AC
  Filled 2024-05-24: qty 100

## 2024-05-24 MED ORDER — LACTATED RINGERS IV SOLN: INTRAVENOUS | Status: DC

## 2024-05-24 MED ORDER — PHENYLEPHRINE 80 MCG/ML (10ML) SYRINGE FOR IV PUSH (FOR BLOOD PRESSURE SUPPORT)
PREFILLED_SYRINGE | INTRAVENOUS | Status: AC
  Filled 2024-05-24: qty 10

## 2024-05-24 MED ORDER — VANCOMYCIN HCL IN DEXTROSE 1-5 GM/200ML-% IV SOLN
INTRAVENOUS | Status: AC
  Filled 2024-05-24: qty 200

## 2024-05-24 MED ORDER — LEVOFLOXACIN IN D5W 500 MG/100ML IV SOLN
500.0000 mg | INTRAVENOUS | Status: AC
  Administered 2024-05-24: 500 mg via INTRAVENOUS

## 2024-05-24 MED ORDER — LEVOFLOXACIN IN D5W 500 MG/100ML IV SOLN
INTRAVENOUS | Status: AC
  Filled 2024-05-24: qty 100

## 2024-05-24 MED ORDER — MIDAZOLAM HCL 2 MG/2ML IJ SOLN
INTRAMUSCULAR | Status: AC
  Filled 2024-05-24: qty 2

## 2024-05-24 MED ORDER — OXYCODONE HCL 5 MG/5ML PO SOLN: 5.0000 mg | Freq: Once | ORAL | Status: DC | PRN

## 2024-05-24 MED ORDER — LIDOCAINE HCL (PF) 1 % IJ SOLN
INTRAMUSCULAR | Status: DC | PRN
  Administered 2024-05-24: 10 mL

## 2024-05-24 MED ORDER — OXYCODONE HCL 5 MG PO TABS: 5.0000 mg | ORAL_TABLET | Freq: Once | ORAL | Status: DC | PRN

## 2024-05-24 MED ORDER — VANCOMYCIN HCL 1500 MG/300ML IV SOLN
1500.0000 mg | INTRAVENOUS | Status: AC
  Administered 2024-05-24: 1500 mg via INTRAVENOUS
  Filled 2024-05-24: qty 300

## 2024-05-24 MED ORDER — BUPIVACAINE HCL 0.25 % IJ SOLN
INTRAMUSCULAR | Status: DC | PRN
  Administered 2024-05-24: 10 mL

## 2024-05-24 MED ORDER — LIDOCAINE HCL (CARDIAC) PF 100 MG/5ML IV SOSY
PREFILLED_SYRINGE | INTRAVENOUS | Status: DC | PRN
  Administered 2024-05-24: 40 mg via INTRAVENOUS

## 2024-05-24 MED ORDER — 0.9 % SODIUM CHLORIDE (POUR BTL) OPTIME
TOPICAL | Status: DC | PRN
  Administered 2024-05-24: 100 mL

## 2024-05-24 MED ORDER — MIDAZOLAM HCL 5 MG/5ML IJ SOLN
INTRAMUSCULAR | Status: DC | PRN
  Administered 2024-05-24 (×2): 1 mg via INTRAVENOUS

## 2024-05-24 MED ORDER — ACETAMINOPHEN 10 MG/ML IV SOLN: 1000.0000 mg | Freq: Once | INTRAVENOUS | Status: DC | PRN

## 2024-05-24 MED ORDER — PROPOFOL 500 MG/50ML IV EMUL
INTRAVENOUS | Status: DC | PRN
  Administered 2024-05-24: 50 ug/kg/min via INTRAVENOUS

## 2024-05-24 MED ORDER — PHENYLEPHRINE HCL (PRESSORS) 10 MG/ML IV SOLN
INTRAVENOUS | Status: DC | PRN
  Administered 2024-05-24 (×4): 80 ug via INTRAVENOUS

## 2024-05-24 NOTE — Anesthesia Postprocedure Evaluation (Signed)
"   Anesthesia Post Note  Patient: Judith [name]  Procedure(s) Performed: RELEASE, FIRST DORSAL COMPARTMENT, HAND (Left) RELEASE, A1 PULLEY, FOR TRIGGER FINGER (Left)     Patient location during evaluation: PACU Anesthesia Type: MAC Level of consciousness: awake Pain management: pain level controlled Vital Signs Assessment: post-procedure vital signs reviewed and stable Respiratory status: spontaneous breathing, nonlabored ventilation and respiratory function stable Cardiovascular status: blood pressure returned to baseline and stable Postop Assessment: no apparent nausea or vomiting Anesthetic complications: no   No notable events documented.  Last Vitals:  Vitals:   05/24/24 1315 05/24/24 1339  BP: 99/66 104/76  Pulse: 80 80  Resp: 16 18  Temp:  (!) 36.3 C  SpO2: 91% 96%    Last Pain:  Vitals:   05/24/24 1315  TempSrc:   PainSc: 3                  Teion Ballin P Shyteria Lewis      "

## 2024-05-24 NOTE — Interval H&P Note (Signed)
 History and Physical Interval Note:  05/24/2024 9:52 AM  [name]  has presented today for surgery, with the diagnosis of Left de Quervain's tenosynovitis,  Left middle finger trigger finger.  The various methods of treatment have been discussed with the patient and family. After consideration of risks, benefits and other options for treatment, the patient has consented to  Procedures with comments: RELEASE, FIRST DORSAL COMPARTMENT, HAND (Left) - Left first dorsal compartment release.  Left middle finger trigger finger release RELEASE, A1 PULLEY, FOR TRIGGER FINGER (Left) as a surgical intervention.  The patient's history has been reviewed, patient examined, no change in status, stable for surgery.  I have reviewed the patient's chart and labs.  Questions were answered to the patient's satisfaction.     Judith [name]

## 2024-05-24 NOTE — H&P (Signed)
 " HAND SURGERY   HPI: Patient is a 54 y.o. female who presents with left de Quervain's tenosynovitis in the setting of prior [facility] arthroplasty and left middle finger stenosing tenosynovitis.  Patient denies any changes to their medical history or new systemic symptoms today.    Past Medical History:  Diagnosis Date   ADD (attention deficit disorder) without hyperactivity    Allergy    Anxiety    Arthritis    Asthma    Bipolar 1 disorder (HCC)    Blood transfusion without reported diagnosis    with child birth 16 yrs ago    Bronchitis, chronic (HCC)    Closed fracture of one rib of left side 03/15/2020   Constipation    COPD (chronic obstructive pulmonary disease) (HCC)    COVID-19 virus infection 06/21/2020   DDD (degenerative disc disease), cervical    Depression    Diabetes mellitus without complication (HCC)    Dysphagia    Family history of adverse reaction to anesthesia    mother had a hard time waking up after knee surgery in 2021   Gastroparesis    GERD (gastroesophageal reflux disease)    History of kidney stones    History of MRSA infection    Hyperlipemia    Hypothyroid    Migraines    Neuromuscular disorder (HCC)    Pneumonia    Sleep apnea    no cpap    Past Surgical History:  Procedure Laterality Date   ABDOMINAL SURGERY     ANTERIOR CERVICAL DECOMP/DISCECTOMY FUSION N/A 10/09/2020   Procedure: Anterior Cervical Discectomy Fusion Cervical five-six;  Surgeon: Debby Dorn MATSU, MD;  Location: [facility] OR;  Service: Neurosurgery;  Laterality: N/A;   BIOPSY  02/04/2021   Procedure: BIOPSY;  Surgeon: Leigh Elspeth SQUIBB, MD;  Location: [facility];  Service: Gastroenterology;;   BOTOX  INJECTION N/A 02/04/2021   Procedure: BOTOX  INJECTION;  Surgeon: Leigh Elspeth SQUIBB, MD;  Location: [facility];  Service: Gastroenterology;  Laterality: N/A;   CARPAL TUNNEL RELEASE Bilateral 1999   CARPOMETACARPEL SUSPENSION PLASTY Left 08/05/2022   Procedure: Left thumb  trapeziectomy and suspensionplasty;  Surgeon: Romona Harari, MD;  Location: [facility];  Service: Orthopedics;  Laterality: Left;  with regional block  120   CESAREAN SECTION     CHOLECYSTECTOMY     COLONOSCOPY     ESOPHAGEAL MANOMETRY N/A 07/08/2020   Procedure: ESOPHAGEAL MANOMETRY (EM);  Surgeon: Leigh Elspeth SQUIBB, MD;  Location: [facility];  Service: Gastroenterology;  Laterality: N/A;   ESOPHAGOGASTRODUODENOSCOPY (EGD) WITH PROPOFOL  N/A 02/04/2021   Procedure: ESOPHAGOGASTRODUODENOSCOPY (EGD) WITH PROPOFOL ;  Surgeon: Leigh Elspeth SQUIBB, MD;  Location: [facility];  Service: Gastroenterology;  Laterality: N/A;   JOINT REPLACEMENT  01/2008, 06/2018   R and L knee joint   REPLACEMENT TOTAL KNEE Bilateral    RESECTION DISTAL CLAVICAL Left 02/09/2024   Procedure: EXCISION, CLAVICLE, DISTAL, OPEN;  Surgeon: Kay Kemps, MD;  Location: [facility];  Service: Orthopedics;  Laterality: Left;   SCLEROTHERAPY  02/04/2021   Procedure: SCLEROTHERAPY;  Surgeon: Leigh Elspeth SQUIBB, MD;  Location: [facility];  Service: Gastroenterology;;   SHOULDER ARTHROSCOPY WITH OPEN ROTATOR CUFF REPAIR Left 02/09/2024   Procedure: ARTHROSCOPY, SHOULDER WITH REPAIR, ROTATOR CUFF, OPEN;  Surgeon: Kay Kemps, MD;  Location: [facility];  Service: Orthopedics;  Laterality: Left;   SPINE SURGERY  01/2020   Cervical / discectomy   SUBACROMIAL DECOMPRESSION Left 02/09/2024   Procedure: DECOMPRESSION, SUBACROMIAL SPACE;  Surgeon: Kay Kemps, MD;  Location: [facility];  Service: Orthopedics;  Laterality: Left;   THYROIDECTOMY     TONSILLECTOMY     TONSILLECTOMY AND ADENOIDECTOMY     TRIGGER FINGER RELEASE Left 06/16/2021   Procedure: LEFT THUMB TRIGGER FINGER RELEASE;  Surgeon: Onesimo Oneil LABOR, MD;  Location: [facility];  Service: Orthopedics;  Laterality: Left;   TRIGGER FINGER RELEASE Right 09/29/2021   Procedure: RELEASE TRIGGER FINGER/A-1 PULLEY;  Surgeon: Onesimo Oneil LABOR, MD;  Location: [facility];   Service: Orthopedics;  Laterality: Right;  Right ring, index and thumb trigger finger   UPPER GASTROINTESTINAL ENDOSCOPY     Social History   Socioeconomic History   Marital status: Divorced    Spouse name: Not on file   Number of children: 1   Years of education: Not on file   Highest education level: GED or equivalent  Occupational History   Not on file  Tobacco Use   Smoking status: Former    Current packs/day: 0.00    Average packs/day: 1 pack/day for 5.0 years (5.0 ttl pk-yrs)    Types: Cigarettes    Start date: 04/04/1988    Quit date: 04/04/1993    Years since quitting: 31.1   Smokeless tobacco: Never  Vaping Use   Vaping status: Never Used  Substance and Sexual Activity   Alcohol use: No   Drug use: Yes    Types: Marijuana   Sexual activity: Yes    Birth control/protection: Injection  Other Topics Concern   Not on file  Social History Narrative   Right handed,;   Social Drivers of Health   Tobacco Use: Medium Risk (05/22/2024)   Patient History    Smoking Tobacco Use: Former    Smokeless Tobacco Use: Never    Passive Exposure: Not on Actuary Strain: Low Risk (11/02/2023)   Overall Financial Resource Strain (CARDIA)    Difficulty of Paying Living Expenses: Not very hard  Recent Concern: Financial Resource Strain - Medium Risk (08/20/2023)   Overall Financial Resource Strain (CARDIA)    Difficulty of Paying Living Expenses: Somewhat hard  Food Insecurity: No Food Insecurity (03/27/2024)   Epic    Worried About Programme Researcher, Broadcasting/film/video in the Last Year: Never true    Ran Out of Food in the Last Year: Never true  Transportation Needs: No Transportation Needs (03/27/2024)   Epic    Lack of Transportation (Medical): No    Lack of Transportation (Non-Medical): No  Physical Activity: Inactive (03/27/2024)   Exercise Vital Sign    Days of Exercise per Week: 0 days    Minutes of Exercise per Session: 0 min  Stress: Stress Concern Present (03/27/2024)    [employer] of Occupational Health - Occupational Stress Questionnaire    Feeling of Stress: Rather much  Social Connections: Socially Integrated (03/27/2024)   Social Connection and Isolation Panel    Frequency of Communication with Friends and Family: More than three times a week    Frequency of Social Gatherings with Friends and Family: Three times a week    Attends Religious Services: 1 to 4 times per year    Active Member of Clubs or Organizations: No    Attends Banker Meetings: 1 to 4 times per year    Marital Status: Living with partner  Depression (PHQ2-9): Medium Risk (03/27/2024)   Depression (PHQ2-9)    PHQ-2 Score: 10  Alcohol Screen: Low Risk (06/02/2023)   Alcohol Screen    Last Alcohol Screening Score (AUDIT):  0  Housing: Low Risk (03/27/2024)   Epic    Unable to Pay for Housing in the Last Year: No    Number of Times Moved in the Last Year: 1    Homeless in the Last Year: No  Utilities: Not At Risk (03/27/2024)   Epic    Threatened with loss of utilities: No  Health Literacy: Adequate Health Literacy (03/27/2024)   B1300 Health Literacy    Frequency of need for help with medical instructions: Never   Family History  Problem Relation Age of Onset   Stroke Mother    Asthma Mother    Bronchiolitis Mother    Atrial fibrillation Mother    GER disease Mother    Osteoporosis Mother    Osteoarthritis Mother    Dementia Mother        mild , was medically induced   Transient ischemic attack Mother        x 2   Aneurysm Mother        thoriatic   Bipolar disorder Mother    Depression Mother    Hyperlipidemia Mother    Arthritis Mother    COPD Mother    Non-Hodgkin's lymphoma Father    Hyperlipidemia Father    Arthritis Father    Seizures Brother    Breast cancer Paternal Grandmother    Lung cancer Paternal Grandmother    Mental illness Other    Diabetes Other    Esophageal cancer Other    Cancer Maternal Grandmother    Depression Son     Depression Brother    ADD / ADHD Brother    ADD / ADHD Brother    ADD / ADHD Son    Obesity Son    Diabetes Brother    Diabetes Brother    Diabetes Paternal Aunt    Diabetes Paternal Uncle    Colon cancer Neg Hx    Stomach cancer Neg Hx    Pancreatic cancer Neg Hx    Colon polyps Neg Hx    - negative except otherwise stated in the family history section Allergies[1] Prior to Admission medications  Medication Sig Start Date End Date Taking? Authorizing Provider  albuterol  (PROVENTIL ) (2.5 MG/3ML) 0.083% nebulizer solution INHALE 3 ML BY NEBULIZATION EVERY 6 HOURS AS NEEDED FOR WHEEZING OR SHORTNESS OF BREATH 05/17/24  Yes Gottschalk, Ashly M, DO  Azelastine  HCl 137 MCG/SPRAY SOLN PLACE 1 SPRAY INTO BOTH NOSTRILS 2 (TWO) TIMES DAILY 11/22/23  Yes Jolinda Potter M, DO  celecoxib (CELEBREX) 100 MG capsule Take 100 mg by mouth 2 (two) times daily. 01/22/24  Yes [provider]  cyclobenzaprine  (FLEXERIL ) 10 MG tablet Take 1 tablet (10 mg total) by mouth 3 (three) times daily as needed for muscle spasms. 06/04/23  Yes Gottschalk, Ashly M, DO  CYMBALTA 30 MG capsule Take 30 mg by mouth at bedtime. 01/18/24  Yes [provider]  dexlansoprazole  (DEXILANT ) 60 MG capsule Take 1 capsule (60 mg total) by mouth daily. Has failed: zantac, pepcid, prilosec, protonix , prevacid and nexium. 08/20/23  Yes Gottschalk, Potter M, DO  diazepam (VALIUM) 2 MG tablet Take 2 mg by mouth daily as needed for anxiety. 11/02/23  Yes [provider]  dicyclomine  (BENTYL ) 20 MG tablet Take 1 tablet (20 mg) by mouth every 6 hours as needed for spasms (abdominal cramps). 06/03/23  Yes Jolinda Potter M, DO  FLUoxetine  (PROZAC ) 40 MG capsule Take 1 capsule (40 mg total) by mouth daily. Further fills per psychiatry 10/15/23  Yes  Jolinda Potter M, DO  gabapentin  (NEURONTIN ) 300 MG capsule TAKE 1 CAPSULE BY MOUTH THREE TIMES A DAY Patient taking differently: Take 300-600 mg by mouth See admin  instructions. Take 300 mg by mouth in the morning and take 600 at night 11/22/23  Yes Gottschalk, Ashly M, DO  HYDROcodone -acetaminophen  (NORCO) 10-325 MG tablet Take 1 tablet by mouth every 6 (six) hours as needed for moderate pain (pain score 4-6).   Yes [provider]  insulin  degludec (TRESIBA  FLEXTOUCH) 200 UNIT/ML FlexTouch Pen Inject 50-60 Units into the skin at bedtime as needed. **NEEDS TO BE SEEN BEFORE NEXT REFILL** 04/19/24  Yes Jolinda Potter M, DO  levocetirizine (XYZAL ) 5 MG tablet Take 1 tablet (5 mg total) by mouth every evening. To replace cetirizine  01/24/24  Yes Gottschalk, Ashly M, DO  levothyroxine  (SYNTHROID ) 125 MCG tablet Take 1 tablet (125 mcg total) by mouth daily. 08/20/23  Yes Jolinda Potter M, DO  lisdexamfetamine  (VYVANSE ) 70 MG capsule Take 70 mg by mouth daily.   Yes [provider]  meclizine  (ANTIVERT ) 25 MG tablet TAKE 1 TABLET BY MOUTH 3 TIMES DAILY AS NEEDED FOR DIZZINESS. 05/17/24  Yes Jaffe, Adam R, DO  Melatonin 10 MG TABS Take 10 mg by mouth at bedtime.   Yes [provider]  montelukast  (SINGULAIR ) 10 MG tablet TAKE 1 TABLET BY MOUTH EVERYDAY AT BEDTIME 03/15/24  Yes Gottschalk, Ashly M, DO  nitrofurantoin , macrocrystal-monohydrate, (MACROBID ) 100 MG capsule Take 1 capsule (100 mg total) by mouth 2 (two) times daily. 04/30/24  Yes Iola Lukes, FNP  ondansetron  (ZOFRAN -ODT) 4 MG disintegrating tablet Take 1 tablet (4 mg total) by mouth every 8 (eight) hours as needed for nausea or vomiting. 05/06/23  Yes Hermanns, Ashlee P, PA-C  pioglitazone  (ACTOS ) 45 MG tablet Take 1 tablet (45 mg total) by mouth daily. 08/20/23  Yes Gottschalk, Ashly M, DO  REXULTI 2 MG TABS tablet Take 2 mg by mouth daily.   Yes [provider]  Rimegepant Sulfate (NURTEC) 75 MG TBDP Dissolve 1 tablet in mouth at onset of migraine headache. MAX 1 tablet per 24 hours. 08/20/23  Yes Jolinda Potter M, DO  rosuvastatin  (CRESTOR ) 40 MG tablet Take 1  tablet (40 mg total) by mouth at bedtime. 08/20/23  Yes Jolinda Potter M, DO  Accu-Chek FastClix Lancets MISC Test blood sugars daily Dx E11.9 11/27/21   Jolinda Potter M, DO  albuterol  (VENTOLIN  HFA) 108 (90 Base) MCG/ACT inhaler Inhale 2 puffs into the lungs every 6 (six) hours as needed for wheezing or shortness of breath (Cough). 06/06/23   Joesph Shaver Scales, PA-C  amoxicillin -clavulanate (AUGMENTIN ) 875-125 MG tablet Take 1 tablet by mouth every 12 (twelve) hours. Patient not taking: Reported on 04/30/2024 02/07/24   Silver Fell A, PA  Blood Glucose Monitoring Suppl [facility] VERIO FLEX SYSTEM) w/Device KIT TEST BLOOD SUGARS DAILY DX E11.9 04/06/24   Jolinda Potter M, DO  Continuous Glucose Sensor (FREESTYLE LIBRE 3 SENSOR) MISC Use to check blood glucose continously, change sensor every 14 days 06/22/23   Jolinda Potter M, DO  Diclofenac  Sodium 3 % GEL Apply 4 g topically daily as needed (knee pain). **note SIG change due to strength change 01/24/24   Jolinda Potter M, DO  EPINEPHrine  0.3 mg/0.3 mL IJ SOAJ injection Inject 0.3 mg into the muscle as needed for anaphylaxis. 11/25/22   Hunsucker, Donnice SAUNDERS, MD  Erenumab -aooe (AIMOVIG ) 140 MG/ML SOAJ Inject 140 mg into the skin every 28 (twenty-eight) days. 11/29/23   Skeet,  Adam R, DO  glucose blood (ONETOUCH VERIO) test strip Test blood sugars daily Dx E11.9 01/06/24   Jolinda Potter M, DO  hydrOXYzine  (ATARAX ) 10 MG tablet Take 1 tablet (10 mg total) by mouth 3 (three) times daily as needed for anxiety. Patient taking differently: Take 20-30 mg by mouth at bedtime. 06/30/23   Jolinda Potter HERO, DO  Insulin  Pen Needle (B-D UF III MINI PEN NEEDLES) 31G X 5 MM MISC use as directed once daily Dx E11.8 04/12/23   Jolinda Potter M, DO  lidocaine  (LIDODERM ) 5 % Place 1 patch onto the skin daily as needed (PAIN).    [provider]  linaclotide  (LINZESS ) 72 MCG capsule Take 1 capsule (72 mcg total) by mouth daily before  breakfast. Patient taking differently: Take 72 mcg by mouth daily as needed (constipation). 02/10/22   Jolinda Potter HERO, DO  meloxicam  (MOBIC ) 7.5 MG tablet Take 7.5 mg by mouth 2 (two) times daily. Patient not taking: Reported on 04/30/2024    [provider]  Mepolizumab  (NUCALA ) 100 MG/ML SOAJ Inject 1 mL (100 mg total) into the skin every 28 (twenty-eight) days. 01/05/24   Hunsucker, Donnice SAUNDERS, MD  nystatin  cream (MYCOSTATIN ) Apply 1 Application topically 2 (two) times daily. x2 weeks Patient taking differently: Apply 1 Application topically 2 (two) times daily as needed (irritation/rash). 05/06/23   Hermanns, Ashlee P, PA-C  PERCOCET 10-325 MG tablet Take 1 tablet by mouth every 8 (eight) hours as needed for pain. Per pain management 06/26/23   [provider]  tirzepatide  (MOUNJARO ) 15 MG/0.5ML Pen Inject 15 mg into the skin once a week. 07/09/23   Jolinda Potter HERO, DO   No results found. - Positive ROS: All other systems have been reviewed and were otherwise negative with the exception of those mentioned in the HPI and as above.  Physical Exam: General: No acute distress, resting comfortably Cardiovascular: BUE warm and well perfused, normal rate Respiratory: Normal WOB on RA Skin: Warm and dry Neurologic: Sensation intact distally Psychiatric: Patient is at baseline mood and affect  Left Upper Extremity  Well-healed incision at the dorsal aspect of the thumb at the level of the [facility] joint. There is no erythema. There is no swelling. She has no pain with [facility] grind test. There is no crepitus or instability. She is very tender to palpation over the tip of the radial styloid and the area around the first dorsal compartment. She does have pain with Finkelstein maneuver. She is tender to palpation over the middle finger A1 pulley. She has full and painless range of motion of all fingers with some subtle catching of the middle finger. Sensation is intact to light touch  throughout the hand. All fingers are pink and well-perfused.   Assessment: 54 yo F w/ left De Quervain's tenosynovitis and middle finger stenosing tenosynovitis.   Plan: OR today for left first dorsal compartment release and middle finger A1 pulley release.  We reviewed the risks of surgery which include, but are not limited to, bleeding, infection, damage to neurovascular structures, persistent symptoms, stiffness or persistent swelling, delayed wound healing,  need for additional surgery.   We reviewed the expected postoperative course and recovery process.   Bebe Galla, M.D. [facility] 8:44 AM     [1]  Allergies Allergen Reactions   Diphenhydramine  Other (See Comments)    Hair feels like it is crawling   Keflex  [Cephalexin ] Hives   Morphine  Nausea And Vomiting    PCA PUMP/ DRIP -- N/V  IV PUSH IN ER NO PROBLEM PER PT.     Prednisone Nausea And Vomiting   Adhesive [Tape]     Paper tape causes irritation    Erythromycin Hives   Latex Hives   Metformin And Related Diarrhea    Diarrhea with IR and XR products   Sulfonamide Derivatives Other (See Comments)    Does not take due to family history   "

## 2024-05-24 NOTE — Discharge Instructions (Addendum)
Post Anesthesia Home Care Instructions  Activity: Get plenty of rest for the remainder of the day. A responsible individual must stay with you for 24 hours following the procedure.  For the next 24 hours, DO NOT: -Drive a car -Operate machinery -Drink alcoholic beverages -Take any medication unless instructed by your physician -Make any legal decisions or sign important papers.  Meals: Start with liquid foods such as gelatin or soup. Progress to regular foods as tolerated. Avoid greasy, spicy, heavy foods. If nausea and/or vomiting occur, drink only clear liquids until the nausea and/or vomiting subsides. Call your physician if vomiting continues.  Special Instructions/Symptoms: Your throat may feel dry or sore from the anesthesia or the breathing tube placed in your throat during surgery. If this causes discomfort, gargle with warm salt water. The discomfort should disappear within 24 hours.  If you had a scopolamine patch placed behind your ear for the management of post- operative nausea and/or vomiting:  1. The medication in the patch is effective for 72 hours, after which it should be removed.  Wrap patch in a tissue and discard in the trash. Wash hands thoroughly with soap and water. 2. You may remove the patch earlier than 72 hours if you experience unpleasant side effects which may include dry mouth, dizziness or visual disturbances. 3. Avoid touching the patch. Wash your hands with soap and water after contact with the patch.      Kadden Osterhout, M.D. Hand Surgery  POST-OPERATIVE DISCHARGE INSTRUCTIONS   PRESCRIPTIONS: - You may have been given a prescription to be taken as directed for post-operative pain control.  You may also take over the counter ibuprofen/aleve and tylenol for pain. Take this as directed on the packaging. Do not exceed 3000 mg tylenol/acetaminophen in 24 hours.  Ibuprofen 600-800 mg (3-4) tablets by mouth every 6 hours as needed for pain.    OR  Aleve 2 tablets by mouth every 12 hours (twice daily) as needed for pain.   AND/OR  Tylenol 1000 mg (2 tablets) every 8 hours as needed for pain.  - Please use your pain medication carefully, as refills are limited and you may not be provided with one.  As stated above, please use over the counter pain medicine - it will also be helpful with decreasing your swelling.    ANESTHESIA: -After your surgery, post-surgical discomfort or pain is likely. This discomfort can last several days to a few weeks. At certain times of the day your discomfort may be more intense.   Did you receive a nerve block?   - A nerve block can provide pain relief for one hour to two days after your surgery. As long as the nerve block is working, you will experience little or no sensation in the area the surgeon operated on.  - As the nerve block wears off, you will begin to experience pain or discomfort. It is very important that you begin taking your prescribed pain medication before the nerve block fully wears off. Treating your pain at the first sign of the block wearing off will ensure your pain is better controlled and more tolerable when full-sensation returns. Do not wait until the pain is intolerable, as the medicine will be less effective. It is better to treat pain in advance than to try and catch up.   General Anesthesia:  If you did not receive a nerve block during your surgery, you will need to start taking your pain medication shortly after your surgery and should   continue to do so as prescribed by your surgeon.     ICE AND ELEVATION: - You may use ice for the first 48-72 hours, but it is not critical.   - Motion of your fingers is very important to decrease the swelling.  - Elevation, as much as possible for the next 48 hours, is critical for decreasing swelling as well as for pain relief. Elevation means when you are seated or lying down, you hand should be at or above your heart. When walking,  the hand needs to be at or above the level of your elbow.  - If the bandage gets too tight, it may need to be loosened. Please contact our office and we will instruct you in how to do this.    SURGICAL BANDAGES:  - Keep your dressing and/or splint clean and dry at all times.  You can remove your dressing 7 days from now and change with a dry dressing or Band-Aids as needed thereafter. - You may place a plastic bag over your bandage to shower, but be careful, do not get your bandages wet.  - After the bandages have been removed, it is OK to get the stitches wet in a shower or with hand washing. Do Not soak or submerge the wound yet. Please do not use lotions or creams on the stitches.      HAND THERAPY:  - You may not need any. If you do, we will begin this at your follow up visit in the clinic.    ACTIVITY AND WORK: - You are encouraged to move any fingers which are not in the bandage.  - Light use of the fingers is allowed to assist the other hand with daily hygiene and eating, but strong gripping or lifting is often uncomfortable and should be avoided.  - You might miss a variable period of time from work and hopefully this issue has been discussed prior to surgery. You may not do any heavy work with your affected hand for about 2 weeks.    [facility] Second Floor, [address] [place], [place] [address] [phone]  

## 2024-05-24 NOTE — Transfer of Care (Signed)
 Immediate Anesthesia Transfer of Care Note  Patient: Judith [name]  Procedure(s) Performed: RELEASE, FIRST DORSAL COMPARTMENT, HAND (Left) RELEASE, A1 PULLEY, FOR TRIGGER FINGER (Left)  Patient Location: PACU  Anesthesia Type:MAC and General  Level of Consciousness: awake  Airway & Oxygen  Therapy: Patient Spontanous Breathing  Post-op Assessment: Report given to RN  Post vital signs: Reviewed and stable  Last Vitals:  Vitals Value Taken Time  BP 110/71 05/24/24 12:30  Temp    Pulse 87 05/24/24 12:33  Resp 18 05/24/24 12:33  SpO2 98 % 05/24/24 12:33  Vitals shown include unfiled device data.  Last Pain:  Vitals:   05/24/24 1007  TempSrc: Temporal  PainSc: 0-No pain      Patients Stated Pain Goal: 3 (05/24/24 1007)  Complications: No notable events documented.

## 2024-05-24 NOTE — Progress Notes (Signed)
 Skin reaction completely gone prior to dc after discontinuing vancomycin  infusion. VSS.

## 2024-05-24 NOTE — Progress Notes (Signed)
 Pt developed red splotches on arm that IV was infusing vancomycin . 32ml left of 750mg  dose left to administer (1g per 200ml). Dr Corinne came to evaluate pt, Vancomycin  infusion stopped - Dr Corinne spoke with Dr Romona and ok to dc vancomycin  infusion. Red splotches improving, almost gone completely. Pt denies any more itching.

## 2024-05-24 NOTE — Progress Notes (Signed)
 May give only 750 mg Vancomycin  IV (instead of 1500mg ) per Dr. Romona.

## 2024-05-24 NOTE — Op Note (Signed)
 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  Risks, benefits, and alternatives to surgery were again discussed with the patient in the preoperative area. The patient wishes to proceed with surgery.  Informed consent was signed after our discussion.   PREOPERATIVE DIAGNOSIS:  Left De Quervain's tenosynovitis Left middle finger A1 pulley release  POSTOPERATIVE DIAGNOSIS: Same.  PROCEDURE:  Left first dorsal compartment release Left middle finger A1 pulley release   SURGEON: Carlin Galla, M.D.  ASSIST: None  ANESTHESIA:  Local, MAC  IV FLUIDS AND URINE: See anesthesia.  ESTIMATED BLOOD LOSS: 5 mL.  IMPLANTS: * No implants in log *   DRAINS: None  COMPLICATIONS: None noted  DESCRIPTION OF PROCEDURE:   The patient was met in the preoperative holding area where the surgical site was marked and the consent form was signed.  She was brought to the operating room and remained on the stretcher.  A hand table was placed adjacent to the operative extremity and locked into place.  All bony prominences were well padded.  A tourniquet was placed on the left forearm.  A formal timeout was performed to confirm that this was the correct patient, surgical side, surgical site, and surgical procedure.  Following formal timeout, monitored anesthesia was induced.  A local block at the radial wrist and over the middle finger A1 pulley was then performed using a total of 20 cc of an equal mixture of 1% plain lidocaine  and quarter percent plain Marcaine .  The left upper extremity was then prepped and draped in the usual sterile fashion.    Following a second formal timeout, the limb was gently exsanguinated with an Esmarch bandage and the tourniquet inflated to 250 mmHg.  A 2.5 centimeter transverse incision was made  over the radial aspect of the wrist just proximal to the radial styloid.  The skin was incised taking care not to incise through the subcutaneous tissue.  Blunt dissection was used to identify branches of the superficial radial nerve which were protected throughout the procedure.  Further blunt dissection using Senn retractors was used to identify the first dorsal compartment.  Senn retractors were placed both radially and ulnarly to protect the adjacent neurovascular structures.  The compartment was entered at the dorsal aspect.  The extensor retinaculum was extremely thickened.  A tenotomy scissor was used to release the sheath which contained multiple APL tendon slips and the EPB tendon.  The sheath was completely released both proximally and distally.  There was no separate subsheath.  The wound was then thoroughly irrigated and closed using a 4-0 vicryl rapide suture in horizontal mattress fashion.   A longitudinal incision was then made over the middle finger A1 pulley.  The skin was incised.  Blunt dissection was used to identify the A1 pulley.  Two Ragnell retractors were placed on the radial and ulnar sides of the pulley to protect the respective neurovascular bundles.  A third Ragnell was placed at the distal aspect of the wound.  The A1 pulley was clearly identified.  Under direct visualization, the pulley was entered sharply using a 15 blade.  Tenotomy scissors were used to complete the pulley release distally to the level of the A2 pulley.  The distal retractor was then placed in the proximal aspect of the wound.  Under direct visualization, the proximal aspect of the A1 pulley  was completely released. There was fraying of the flexor tendons at this level.    The tourniquet was let down and hemostasis achieved with direct pressure.  The wound was then thoroughly irrigated.  It was closed using 4-0 vicryl rapide sutures in a horizontal mattress fashion.    The wounds were then dressed with  xeroform, 4x4, kerlix, and an ace wrap.    The patient was reversed from sedation.  All counts were correct x 2 at the end of the procedure.  The patient was then taken to the PACU in stable condition.   POSTOPERATIVE PLAN: She will be discharged to home with appropriate pain medication and discharge instructions.  I'll see her back in the office in 10-14 days for her first postop visit.   Carlin Galla, MD 12:27 PM  "

## 2024-05-24 NOTE — Anesthesia Procedure Notes (Signed)
 Procedure Name: MAC Date/Time: 05/24/2024 11:45 AM  Performed by: Pam Macario BROCKS, CRNAPre-anesthesia Checklist: Timeout performed, Patient being monitored, Suction available, Emergency Drugs available and Patient identified Patient Re-evaluated:Patient Re-evaluated prior to induction Oxygen  Delivery Method: Simple face mask Preoxygenation: Pre-oxygenation with 100% oxygen  Induction Type: IV induction Ventilation: Oral airway inserted - appropriate to patient size Placement Confirmation: breath sounds checked- equal and bilateral, CO2 detector and positive ETCO2

## 2024-05-24 NOTE — Anesthesia Preprocedure Evaluation (Addendum)
"                                    Anesthesia Evaluation  Patient identified by MRN, date of birth, ID band Patient awake    Reviewed: Allergy & Precautions, NPO status , [name] Chart, lab work & pertinent test results  Airway Mallampati: II  TM Distance: >3 FB Neck ROM: Full    Dental  (+) Edentulous Upper, Missing   Pulmonary asthma , sleep apnea , COPD,  COPD inhaler, former smoker   Pulmonary exam normal        Cardiovascular Normal cardiovascular exam     Neuro/Psych  Headaches PSYCHIATRIC DISORDERS Anxiety Depression Bipolar Disorder    Neuromuscular disease    GI/Hepatic ,GERD  Medicated and Controlled,,(+)     substance abuse    Endo/Other  diabetes, Insulin  DependentHypothyroidism  Class 3 obesityPatient on GLP-1 Agonist  Renal/GU Renal disease     Musculoskeletal  (+) Arthritis ,  narcotic dependent  Abdominal  (+) + obese  Peds  Hematology negative hematology ROS (+)   Anesthesia Other Findings Left de Quervain's tenosynovitis, Left middle finger trigger finger  Reproductive/Obstetrics                              Anesthesia Physical Anesthesia Plan  ASA: 3  Anesthesia Plan: MAC   Post-op Pain Management:    Induction:   PONV Risk Score and Plan: 2 and Ondansetron , Dexamethasone , Propofol  infusion, Midazolam  and Treatment may vary due to age or medical condition  Airway Management Planned: Simple Face Mask  Additional Equipment:   Intra-op Plan:   Post-operative Plan:   Informed Consent: I have reviewed the patients History and Physical, chart, labs and discussed the procedure including the risks, benefits and alternatives for the proposed anesthesia with the patient or authorized representative who has indicated his/her understanding and acceptance.     Dental advisory given  Plan Discussed with: CRNA  Anesthesia Plan Comments:          Anesthesia Quick Evaluation  "

## 2024-05-25 ENCOUNTER — Encounter (HOSPITAL_BASED_OUTPATIENT_CLINIC_OR_DEPARTMENT_OTHER): Payer: Self-pay | Admitting: Orthopedic Surgery

## 2024-05-30 ENCOUNTER — Other Ambulatory Visit: Payer: Self-pay

## 2024-05-30 NOTE — Progress Notes (Signed)
 Specialty Pharmacy Refill Coordination Note  Judith [name] is a 54 y.o. female contacted today regarding refills of specialty medication(s) Mepolizumab  (Nucala )   Patient requested Delivery   Delivery date: 05/31/24   Verified address: [address] [place] [address]   Medication will be filled on: 05/30/24   Patient aware copay is now $12.65 patient to call back with updated payment information.

## 2024-05-31 ENCOUNTER — Other Ambulatory Visit (HOSPITAL_COMMUNITY): Payer: Self-pay

## 2024-05-31 ENCOUNTER — Ambulatory Visit: Payer: Medicare (Managed Care) | Admitting: Family Medicine

## 2024-05-31 ENCOUNTER — Telehealth: Payer: Self-pay

## 2024-05-31 ENCOUNTER — Other Ambulatory Visit: Payer: Self-pay

## 2024-05-31 DIAGNOSIS — J4540 Moderate persistent asthma, uncomplicated: Secondary | ICD-10-CM

## 2024-05-31 NOTE — Telephone Encounter (Signed)
 Pt enrolled in Asthma grant through [employer]:  Amount: $1500 Award Period: 03/01/24 - 05/29/25 BIN: [id number] PCN: [id number] Group: [id number] ID: [id number]

## 2024-05-31 NOTE — Telephone Encounter (Signed)
 Left message for patient to call back to reschedule, Dr. Jolinda is out off office today.

## 2024-06-01 ENCOUNTER — Other Ambulatory Visit: Payer: Self-pay

## 2024-06-07 ENCOUNTER — Other Ambulatory Visit: Payer: Self-pay

## 2024-06-12 ENCOUNTER — Other Ambulatory Visit: Payer: Self-pay | Admitting: Family Medicine

## 2024-06-12 DIAGNOSIS — E1169 Type 2 diabetes mellitus with other specified complication: Secondary | ICD-10-CM

## 2024-06-15 ENCOUNTER — Other Ambulatory Visit: Payer: Self-pay | Admitting: Family Medicine

## 2024-06-15 DIAGNOSIS — E119 Type 2 diabetes mellitus without complications: Secondary | ICD-10-CM

## 2024-06-16 ENCOUNTER — Other Ambulatory Visit: Payer: Self-pay

## 2024-06-16 NOTE — Addendum Note (Signed)
 Addended by: SHAREN DELON HERO on: 06/16/2024 03:48 PM   Modules accepted: Orders

## 2024-06-19 ENCOUNTER — Ambulatory Visit

## 2024-06-19 ENCOUNTER — Other Ambulatory Visit: Payer: Self-pay

## 2024-06-19 DIAGNOSIS — J4540 Moderate persistent asthma, uncomplicated: Secondary | ICD-10-CM

## 2024-06-19 DIAGNOSIS — Z79899 Other long term (current) drug therapy: Secondary | ICD-10-CM

## 2024-06-19 MED ORDER — NUCALA 100 MG/ML ~~LOC~~ SOAJ
100.0000 mg | SUBCUTANEOUS | 0 refills | Status: AC
  Filled 2024-06-19: qty 1, 28d supply, fill #0

## 2024-06-20 ENCOUNTER — Ambulatory Visit: Payer: Medicare (Managed Care) | Admitting: Neurology

## 2024-06-27 ENCOUNTER — Telehealth: Admitting: Neurology

## 2024-07-19 ENCOUNTER — Ambulatory Visit: Payer: Medicare (Managed Care) | Admitting: Family Medicine

## 2025-03-29 ENCOUNTER — Ambulatory Visit: Payer: Medicare (Managed Care)
